# Patient Record
Sex: Male | Born: 1960 | Race: Black or African American | Hispanic: No | Marital: Married | Smoking: Current every day smoker
Health system: Southern US, Community
[De-identification: ages and names within clinical notes are randomized; demographics above are authoritative.]

## PROBLEM LIST (undated history)

## (undated) DIAGNOSIS — I7300 Raynaud's syndrome without gangrene: Secondary | ICD-10-CM

## (undated) DIAGNOSIS — I509 Heart failure, unspecified: Secondary | ICD-10-CM

## (undated) DIAGNOSIS — I2510 Atherosclerotic heart disease of native coronary artery without angina pectoris: Secondary | ICD-10-CM

## (undated) DIAGNOSIS — I219 Acute myocardial infarction, unspecified: Secondary | ICD-10-CM

## (undated) DIAGNOSIS — I10 Essential (primary) hypertension: Secondary | ICD-10-CM

## (undated) DIAGNOSIS — I313 Pericardial effusion (noninflammatory): Secondary | ICD-10-CM

## (undated) DIAGNOSIS — R569 Unspecified convulsions: Secondary | ICD-10-CM

## (undated) DIAGNOSIS — L80 Vitiligo: Secondary | ICD-10-CM

## (undated) DIAGNOSIS — E039 Hypothyroidism, unspecified: Secondary | ICD-10-CM

## (undated) DIAGNOSIS — I319 Disease of pericardium, unspecified: Secondary | ICD-10-CM

## (undated) DIAGNOSIS — F419 Anxiety disorder, unspecified: Secondary | ICD-10-CM

## (undated) DIAGNOSIS — F17200 Nicotine dependence, unspecified, uncomplicated: Secondary | ICD-10-CM

## (undated) DIAGNOSIS — N184 Chronic kidney disease, stage 4 (severe): Secondary | ICD-10-CM

## (undated) DIAGNOSIS — E785 Hyperlipidemia, unspecified: Secondary | ICD-10-CM

## (undated) DIAGNOSIS — I5030 Unspecified diastolic (congestive) heart failure: Secondary | ICD-10-CM

## (undated) DIAGNOSIS — M349 Systemic sclerosis, unspecified: Secondary | ICD-10-CM

## (undated) HISTORY — DX: Unspecified diastolic (congestive) heart failure: I50.30

## (undated) HISTORY — DX: Raynaud's syndrome without gangrene: I73.00

## (undated) HISTORY — PX: COLON SURGERY: SHX602

## (undated) HISTORY — DX: Chronic kidney disease, stage 4 (severe): N18.4

## (undated) HISTORY — DX: Hypothyroidism, unspecified: E03.9

## (undated) HISTORY — DX: Pericardial effusion (noninflammatory): I31.3

## (undated) HISTORY — DX: Hyperlipidemia, unspecified: E78.5

## (undated) HISTORY — DX: Atherosclerotic heart disease of native coronary artery without angina pectoris: I25.10

## (undated) HISTORY — DX: Vitiligo: L80

## (undated) HISTORY — DX: Systemic sclerosis, unspecified: M34.9

## (undated) HISTORY — DX: Anxiety disorder, unspecified: F41.9

## (undated) HISTORY — DX: Disease of pericardium, unspecified: I31.9

## (undated) HISTORY — DX: Acute myocardial infarction, unspecified: I21.9

## (undated) HISTORY — DX: Essential (primary) hypertension: I10

## (undated) SURGERY — Surgical Case
Anesthesia: *Unknown

---
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
## 1997-11-05 ENCOUNTER — Emergency Department (HOSPITAL_COMMUNITY): Admission: EM | Admit: 1997-11-05 | Discharge: 1997-11-05 | Payer: Self-pay | Admitting: Emergency Medicine

## 1997-11-06 ENCOUNTER — Emergency Department (HOSPITAL_COMMUNITY): Admission: EM | Admit: 1997-11-06 | Discharge: 1997-11-06 | Payer: Self-pay | Admitting: Emergency Medicine

## 1997-11-07 ENCOUNTER — Emergency Department (HOSPITAL_COMMUNITY): Admission: EM | Admit: 1997-11-07 | Discharge: 1997-11-07 | Payer: Self-pay | Admitting: Emergency Medicine

## 1998-09-07 ENCOUNTER — Emergency Department (HOSPITAL_COMMUNITY): Admission: EM | Admit: 1998-09-07 | Discharge: 1998-09-07 | Payer: Self-pay | Admitting: Emergency Medicine

## 2001-08-13 ENCOUNTER — Encounter: Payer: Self-pay | Admitting: Internal Medicine

## 2001-08-13 ENCOUNTER — Encounter: Admission: RE | Admit: 2001-08-13 | Discharge: 2001-08-13 | Payer: Self-pay | Admitting: Internal Medicine

## 2005-08-21 ENCOUNTER — Encounter (HOSPITAL_BASED_OUTPATIENT_CLINIC_OR_DEPARTMENT_OTHER): Admission: RE | Admit: 2005-08-21 | Discharge: 2005-11-19 | Payer: Self-pay | Admitting: Internal Medicine

## 2005-08-22 ENCOUNTER — Ambulatory Visit (HOSPITAL_COMMUNITY): Admission: RE | Admit: 2005-08-22 | Discharge: 2005-08-22 | Payer: Self-pay | Admitting: Internal Medicine

## 2005-08-22 IMAGING — CR DG HAND COMPLETE 3+V*L*
3 series · 3 of 3 positions shown · non-contrast
Comparison: none

CLINICAL DATA: Multiple open ulcers on both hands.  They began on the right hand three weeks ago.
 RIGHT HAND COMPLETE ? 3 VIEWS:

[x hand ap left *]
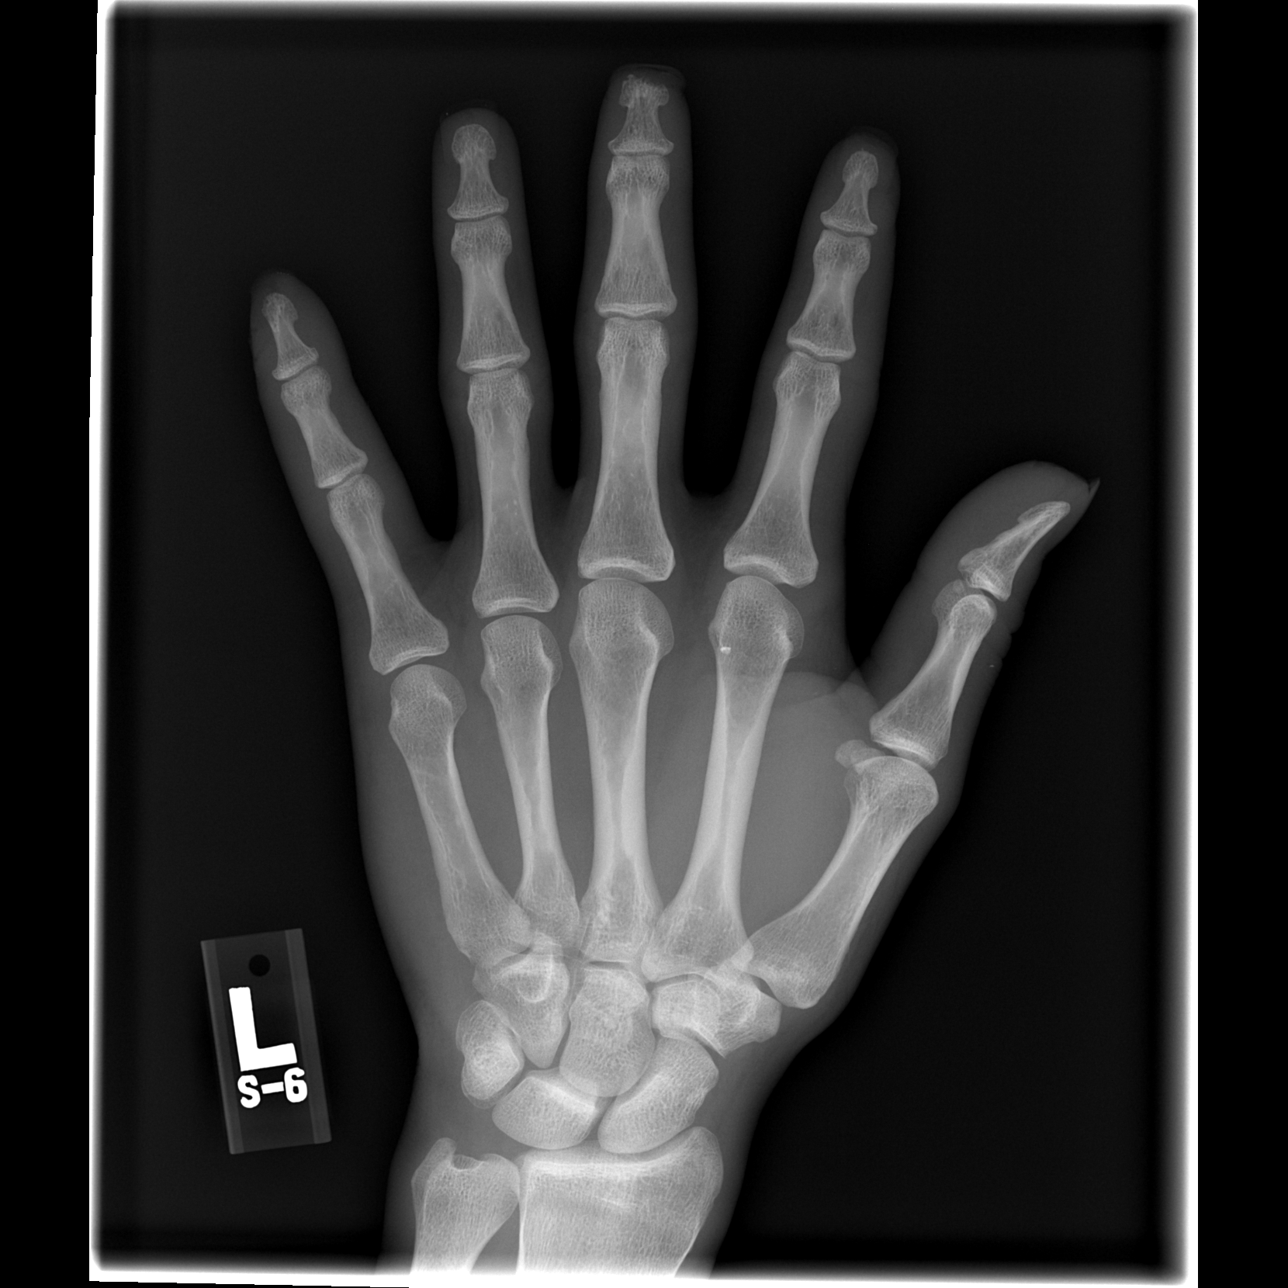

[x hand oblique left]
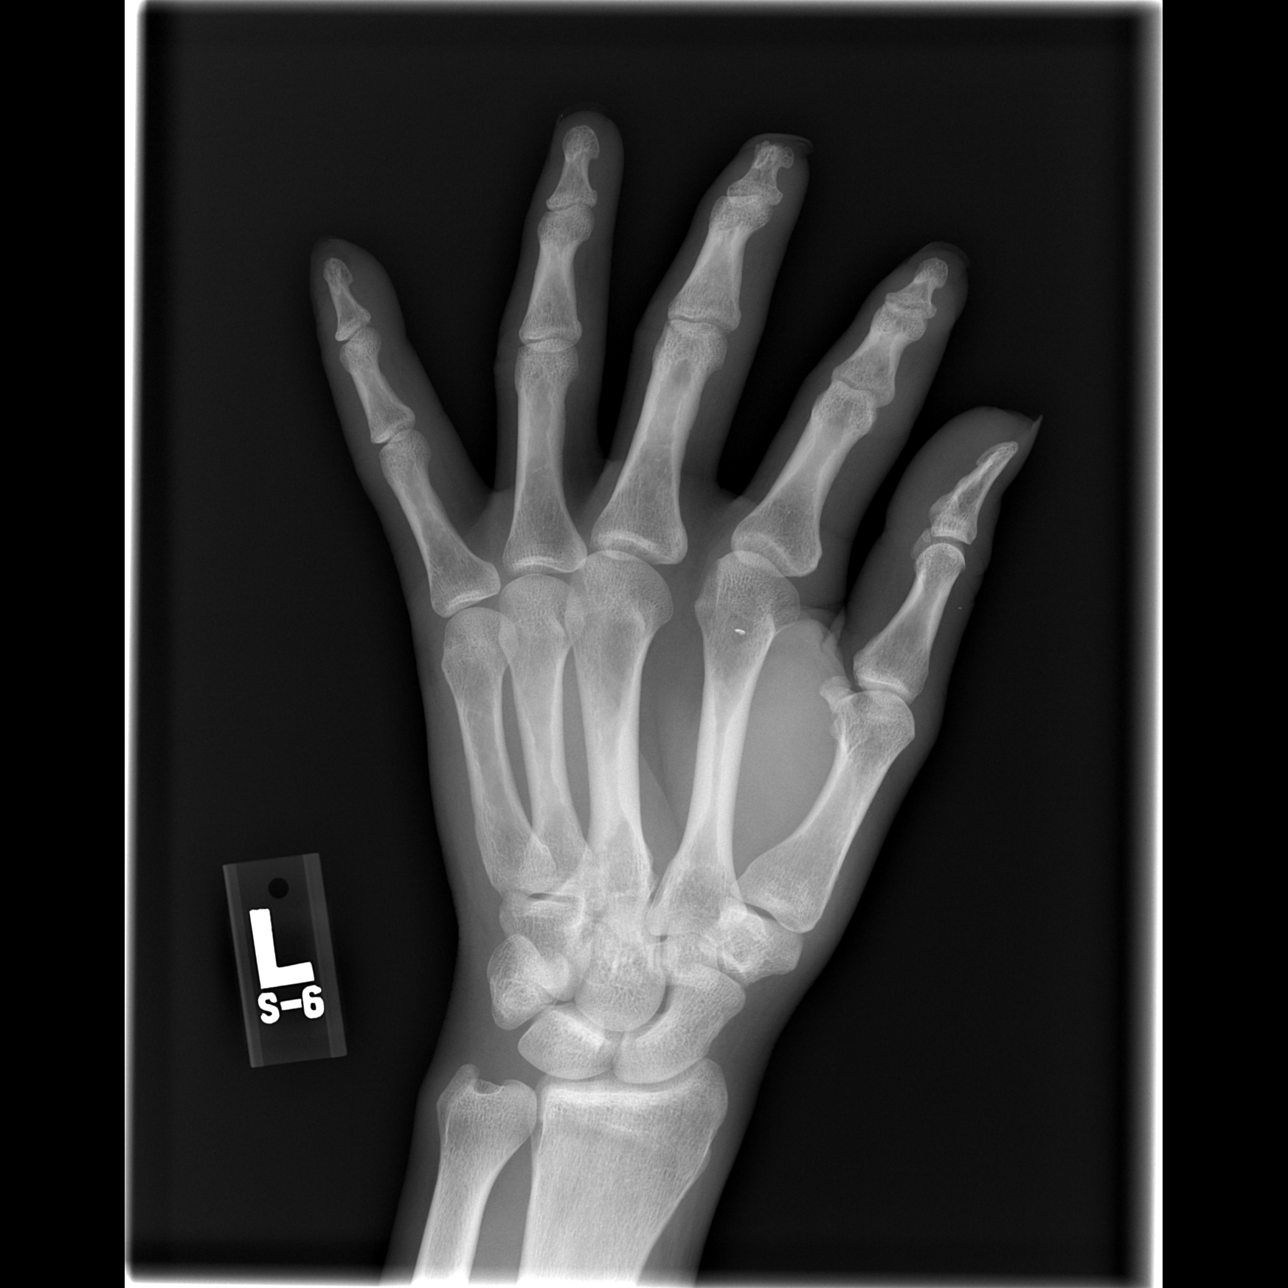

[x hand lat left]
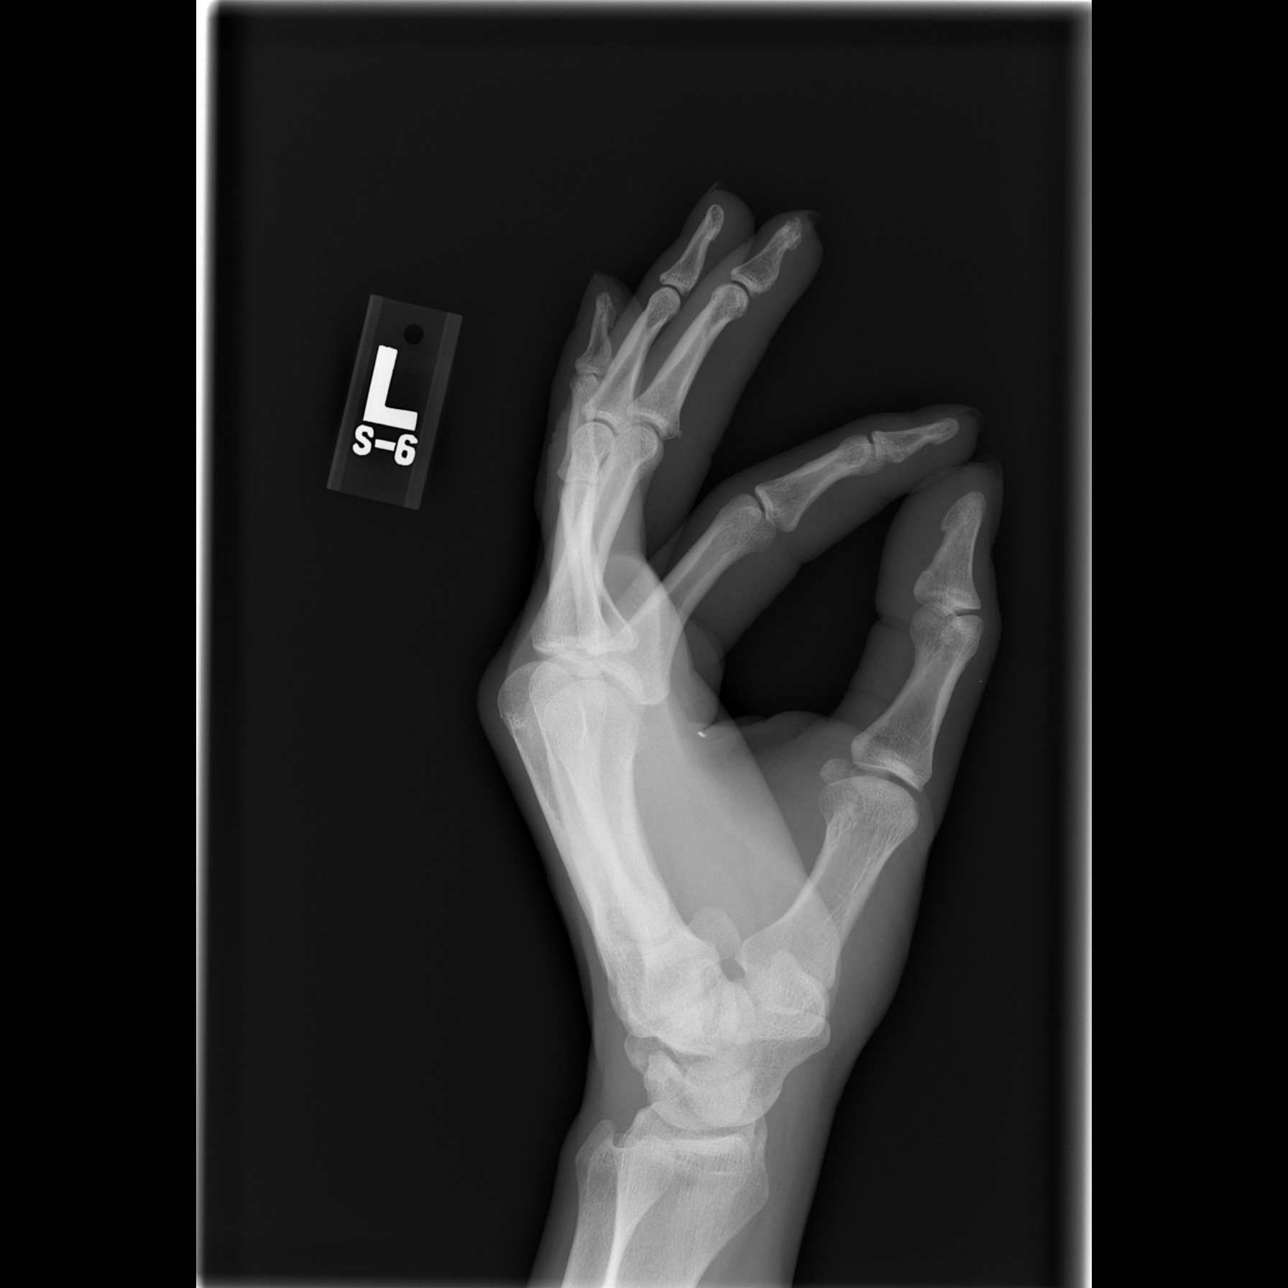

[3 of 3 positions shown; findings below may reference images not displayed]

FINDINGS: Three views of the right hand are made without previous films for comparison and show what appears to be an old partial amputation of the distal phalanx of the index finger which appears to be relatively well healed.  There is also what appears to be degenerative hypertrophic arthritic change associated with the distal interphalangeal joint of the ring finger and there is a small dorsal calcification over the area of the distal interphalangeal joint of the ring finger of questionable etiology.  The calcification measures approximately 1 x 1 x 3 mm.  
 No evidence of osteomyelitis is seen.  There is relatively little swelling associated with the soft tissues of the hand.  The wrist joints appear normal.
IMPRESSION: Degenerative arthritic change with questionable traumatic arthritis in the region of the distal interphalangeal joint of the right ring finger with a 1 x 1 x 3 mm dorsal calcification over that joint.
 Probable traumatic partial amputation of the distal phalanx of the right index finger.  No evidence of osteomyelitis or metallic foreign body is seen.
 LEFT HAND COMPLETE ? 3 VIEWS:
FINDINGS: PA, oblique, and lateral views of the left hand show what appears to be a 1 x 1 x 2 mm metallic foreign body along the palmar surface of the hand near the area of the head and neck of the second metacarpal.  There is also a less than 1 mm in diameter metallic foreign body in the soft tissues dorsal to the proximal phalanx of the thumb.  I see no evidence of osteomyelitis or fracture.  The joint spaces are well maintained.  There appears to be a deformity of the tip of the distal phalanx of the left middle finger suggesting old trauma in that area.  Soft tissues are not significantly swollen.
IMPRESSION: No evidence of osteomyelitis or acute fracture of the left hand.
 There are metallic foreign bodies as noted above.  There also appears to be an old traumatic deformity of the distal tip of the left middle finger.

## 2005-08-22 IMAGING — CR DG HAND COMPLETE 3+V*R*
3 series · 3 of 3 positions shown · non-contrast
Comparison: none

CLINICAL DATA: Multiple open ulcers on both hands.  They began on the right hand three weeks ago.
 RIGHT HAND COMPLETE ? 3 VIEWS:

[x hand ap right]
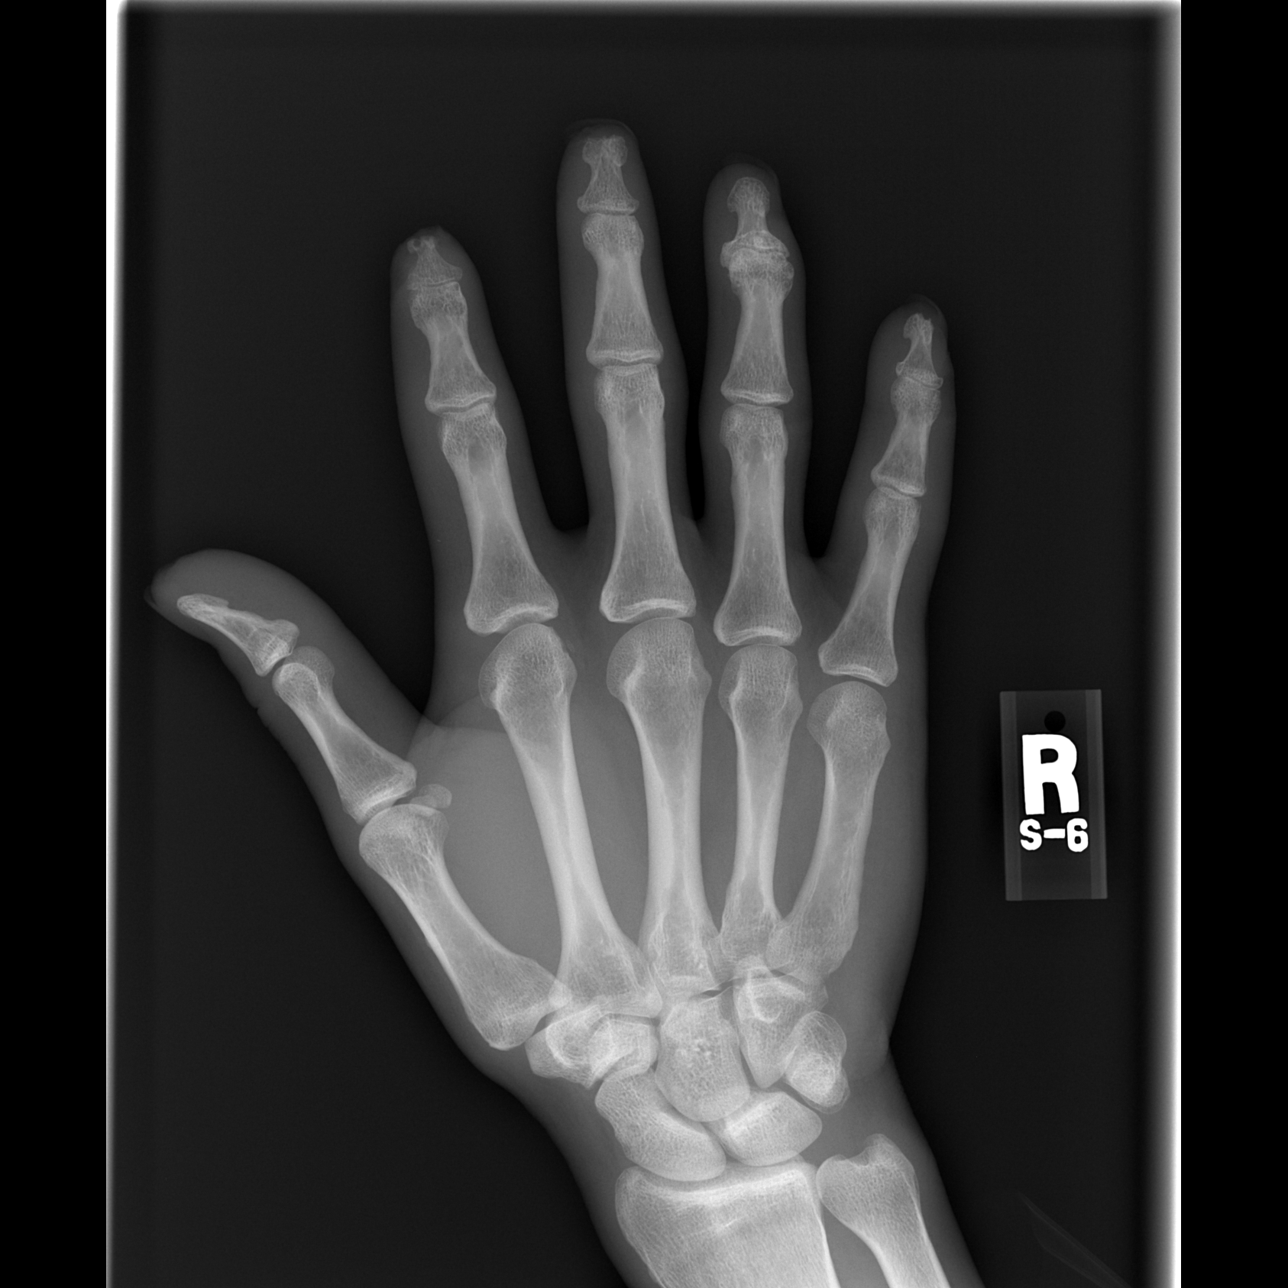

[x hand oblique right]
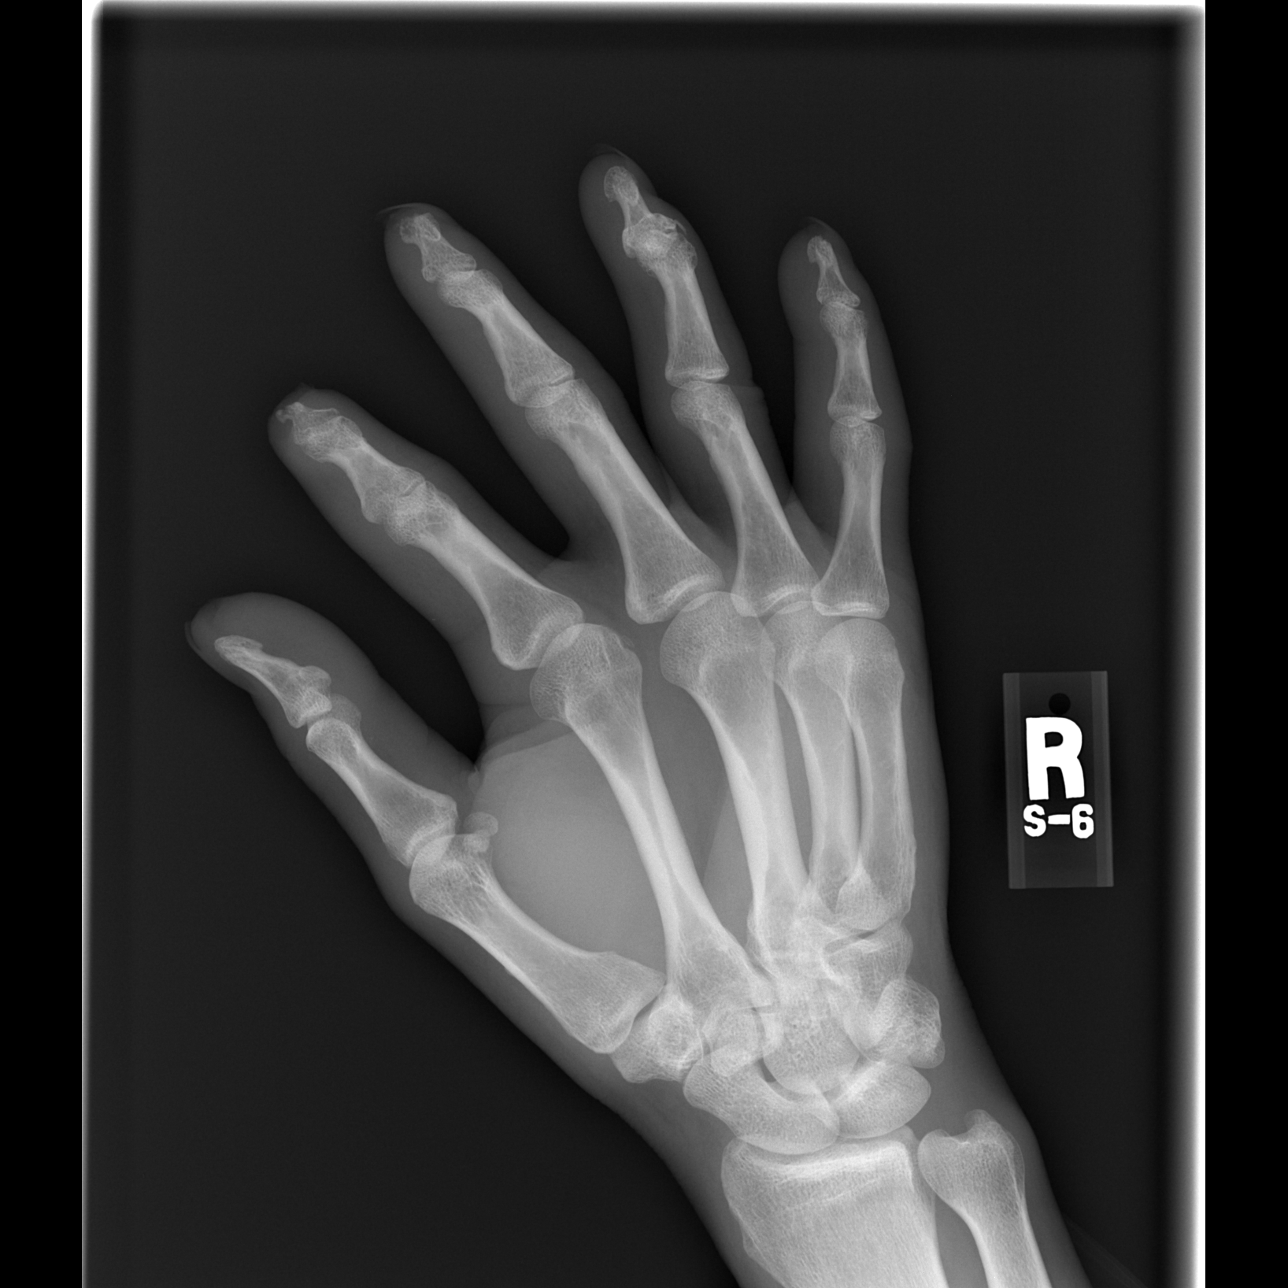

[x hand lat right]
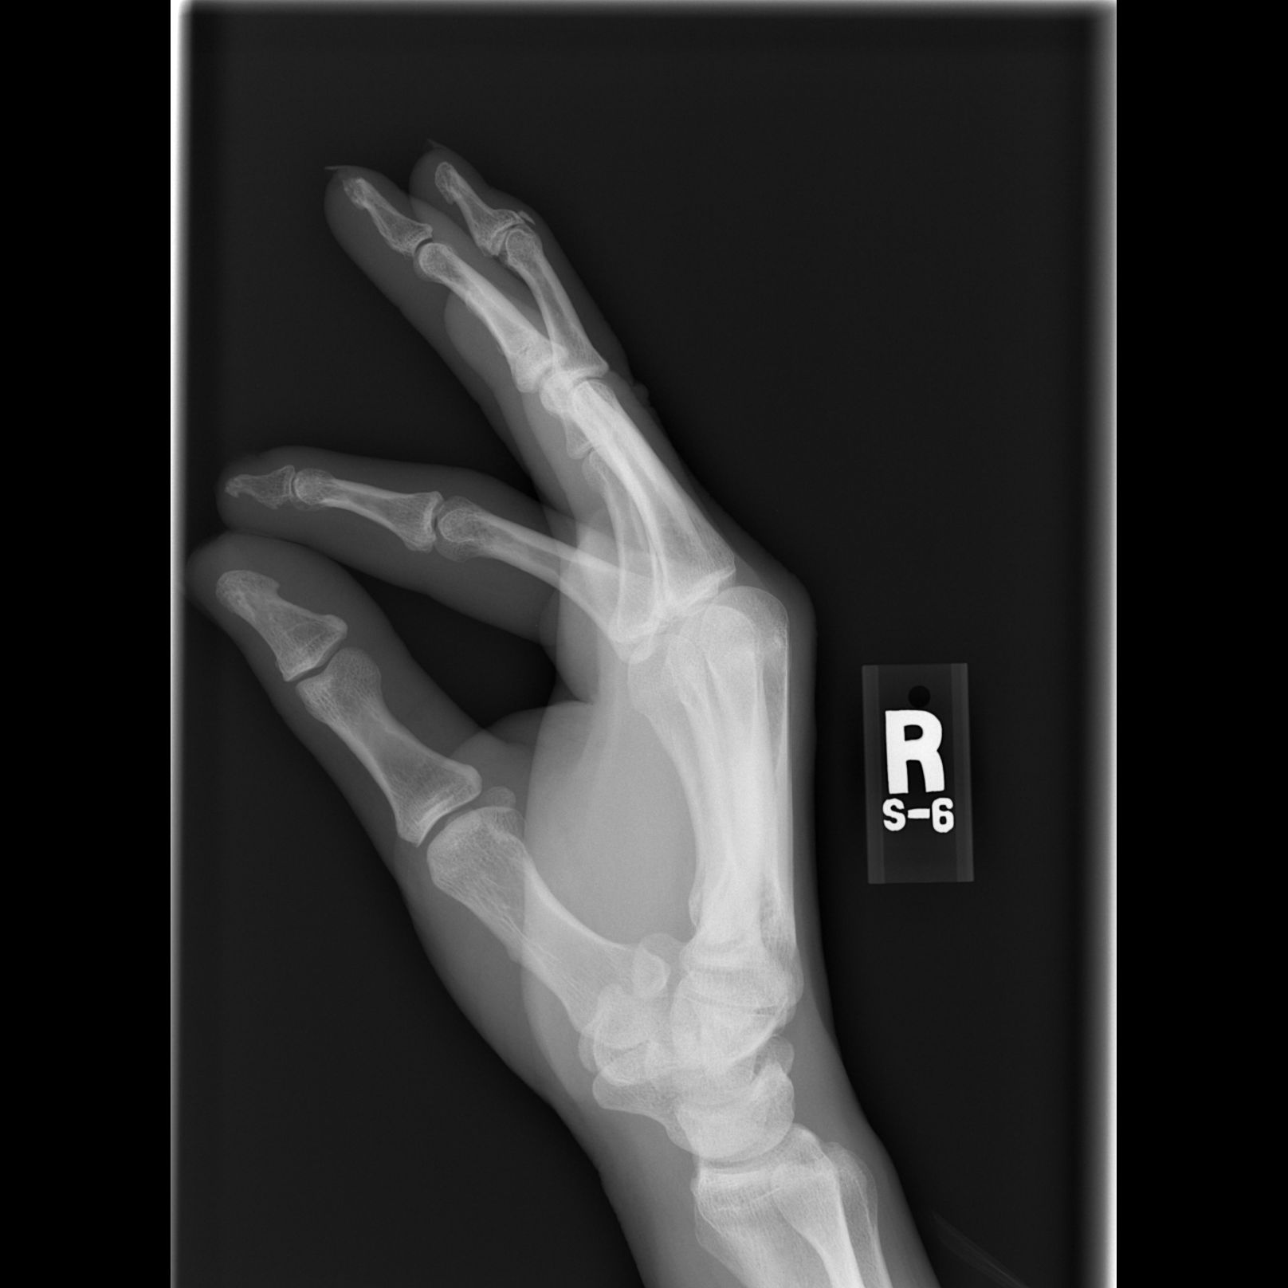

[3 of 3 positions shown; findings below may reference images not displayed]

FINDINGS: Three views of the right hand are made without previous films for comparison and show what appears to be an old partial amputation of the distal phalanx of the index finger which appears to be relatively well healed.  There is also what appears to be degenerative hypertrophic arthritic change associated with the distal interphalangeal joint of the ring finger and there is a small dorsal calcification over the area of the distal interphalangeal joint of the ring finger of questionable etiology.  The calcification measures approximately 1 x 1 x 3 mm.  
 No evidence of osteomyelitis is seen.  There is relatively little swelling associated with the soft tissues of the hand.  The wrist joints appear normal.
IMPRESSION: Degenerative arthritic change with questionable traumatic arthritis in the region of the distal interphalangeal joint of the right ring finger with a 1 x 1 x 3 mm dorsal calcification over that joint.
 Probable traumatic partial amputation of the distal phalanx of the right index finger.  No evidence of osteomyelitis or metallic foreign body is seen.
 LEFT HAND COMPLETE ? 3 VIEWS:
FINDINGS: PA, oblique, and lateral views of the left hand show what appears to be a 1 x 1 x 2 mm metallic foreign body along the palmar surface of the hand near the area of the head and neck of the second metacarpal.  There is also a less than 1 mm in diameter metallic foreign body in the soft tissues dorsal to the proximal phalanx of the thumb.  I see no evidence of osteomyelitis or fracture.  The joint spaces are well maintained.  There appears to be a deformity of the tip of the distal phalanx of the left middle finger suggesting old trauma in that area.  Soft tissues are not significantly swollen.
IMPRESSION: No evidence of osteomyelitis or acute fracture of the left hand.
 There are metallic foreign bodies as noted above.  There also appears to be an old traumatic deformity of the distal tip of the left middle finger.

## 2007-08-01 ENCOUNTER — Encounter (HOSPITAL_BASED_OUTPATIENT_CLINIC_OR_DEPARTMENT_OTHER): Admission: RE | Admit: 2007-08-01 | Discharge: 2007-10-04 | Payer: Self-pay | Admitting: Surgery

## 2007-11-08 ENCOUNTER — Encounter: Payer: Self-pay | Admitting: Internal Medicine

## 2007-11-08 ENCOUNTER — Ambulatory Visit: Payer: Self-pay | Admitting: Internal Medicine

## 2007-11-08 LAB — PULMONARY FUNCTION TEST

## 2008-04-24 DIAGNOSIS — I219 Acute myocardial infarction, unspecified: Secondary | ICD-10-CM

## 2008-04-24 HISTORY — DX: Acute myocardial infarction, unspecified: I21.9

## 2008-12-23 DIAGNOSIS — I3139 Other pericardial effusion (noninflammatory): Secondary | ICD-10-CM

## 2008-12-23 DIAGNOSIS — I313 Pericardial effusion (noninflammatory): Secondary | ICD-10-CM

## 2008-12-23 HISTORY — DX: Other pericardial effusion (noninflammatory): I31.39

## 2008-12-23 HISTORY — DX: Pericardial effusion (noninflammatory): I31.3

## 2008-12-24 ENCOUNTER — Inpatient Hospital Stay (HOSPITAL_COMMUNITY): Admission: AD | Admit: 2008-12-24 | Discharge: 2008-12-28 | Payer: Self-pay | Admitting: Cardiovascular Disease

## 2008-12-24 ENCOUNTER — Ambulatory Visit: Payer: Self-pay | Admitting: Thoracic Surgery (Cardiothoracic Vascular Surgery)

## 2008-12-24 HISTORY — PX: CARDIAC CATHETERIZATION: SHX172

## 2008-12-25 ENCOUNTER — Encounter (INDEPENDENT_AMBULATORY_CARE_PROVIDER_SITE_OTHER): Payer: Self-pay | Admitting: Cardiovascular Disease

## 2008-12-25 ENCOUNTER — Encounter: Payer: Self-pay | Admitting: Thoracic Surgery (Cardiothoracic Vascular Surgery)

## 2008-12-25 HISTORY — PX: PERICARDIAL WINDOW: SHX2213

## 2008-12-25 IMAGING — CR DG CHEST 1V PORT
1 series · 1 of 1 positions shown · non-contrast
Comparison: Portable exam [EW] hours compared to [DATE] at [EW]
hours.

CLINICAL DATA: Fever

PORTABLE CHEST - 1 VIEW

[AP]
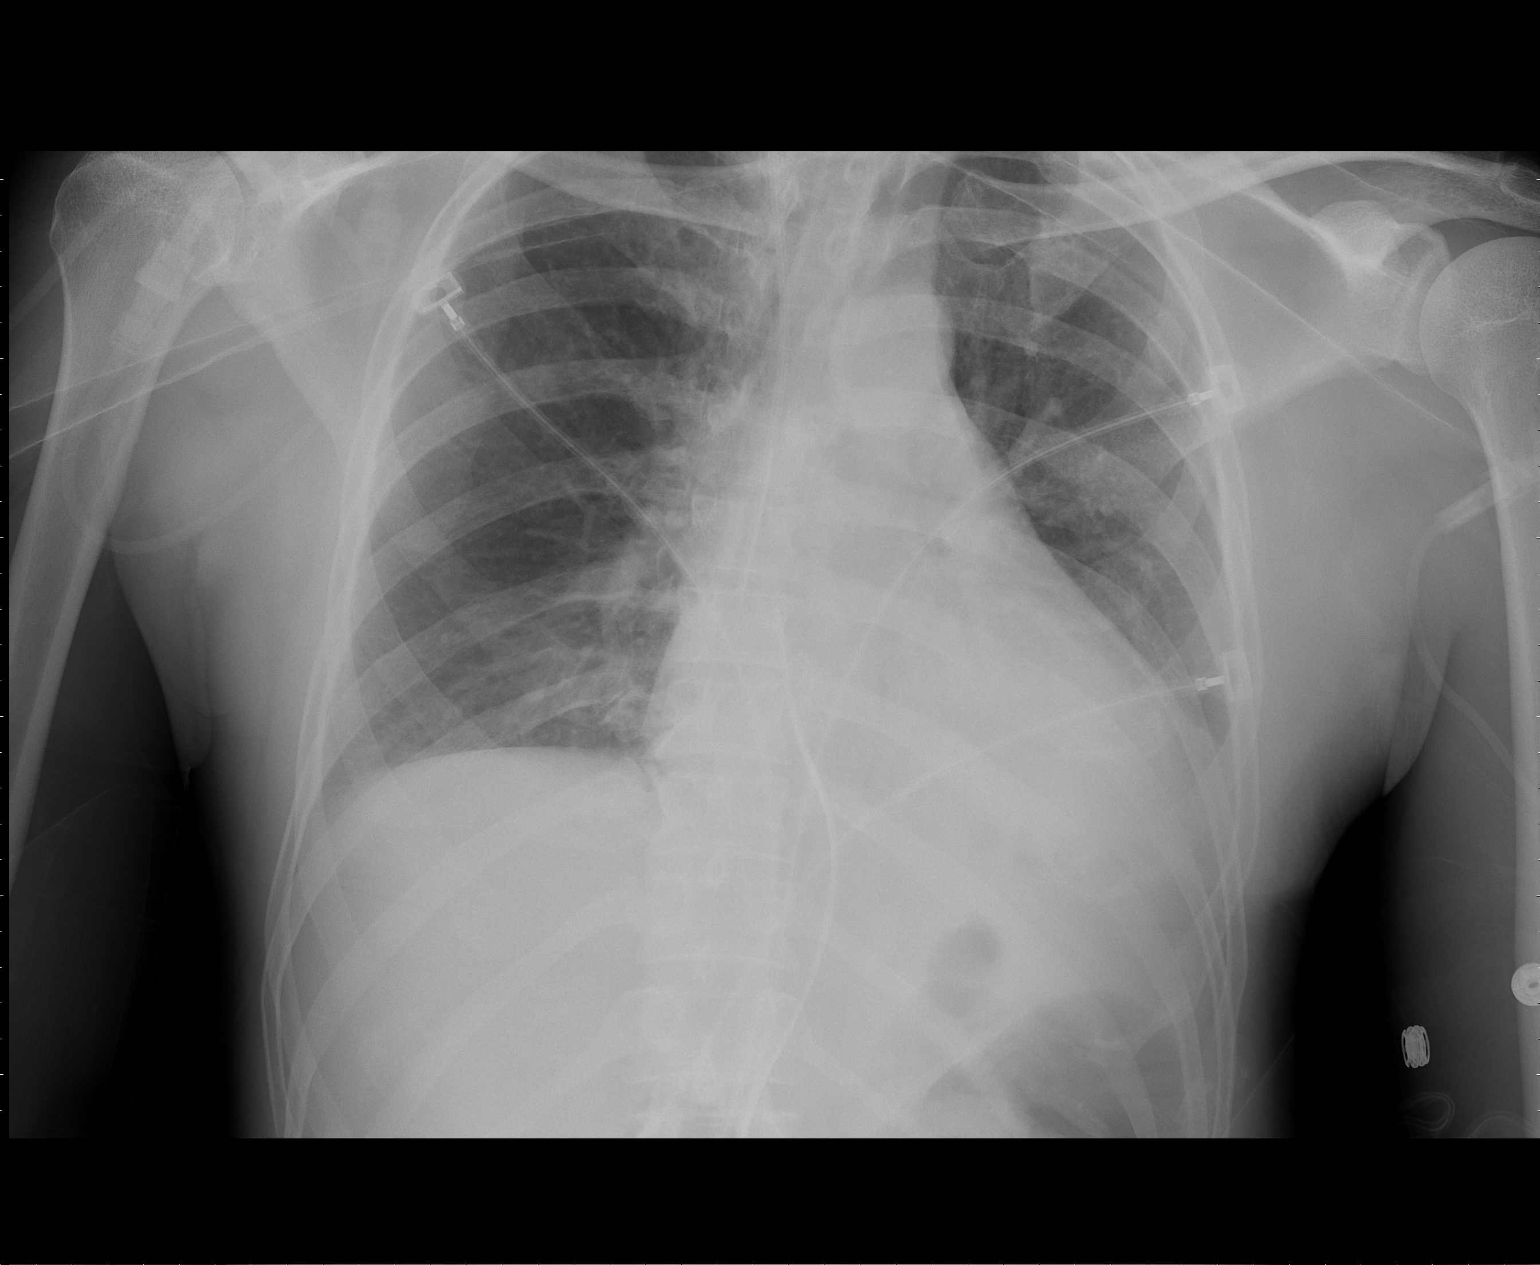

[1 of 1 positions shown; findings below may reference images not displayed]

FINDINGS: Right jugular line stable, tip at cavoatrial junction.
Cardiac enlargement.
Mediastinal contours and pulmonary vascularity normal.
Progressive atelectasis versus consolidation left lower lobe.
Minimal right basilar atelectasis.
Question small bibasilar pleural effusions.
No pneumothorax or focal bony abnormality.
IMPRESSION: Minimal right basilar atelectasis and suspect small pleural
effusions.
Progressive opacification of left lower lobe question atelectasis
versus consolidation.

## 2008-12-25 IMAGING — CR DG CHEST 1V PORT
1 series · 1 of 1 positions shown · non-contrast
Comparison: None.

CLINICAL DATA: Status post pericardial window.

PORTABLE CHEST - 1 VIEW

[view not recorded]
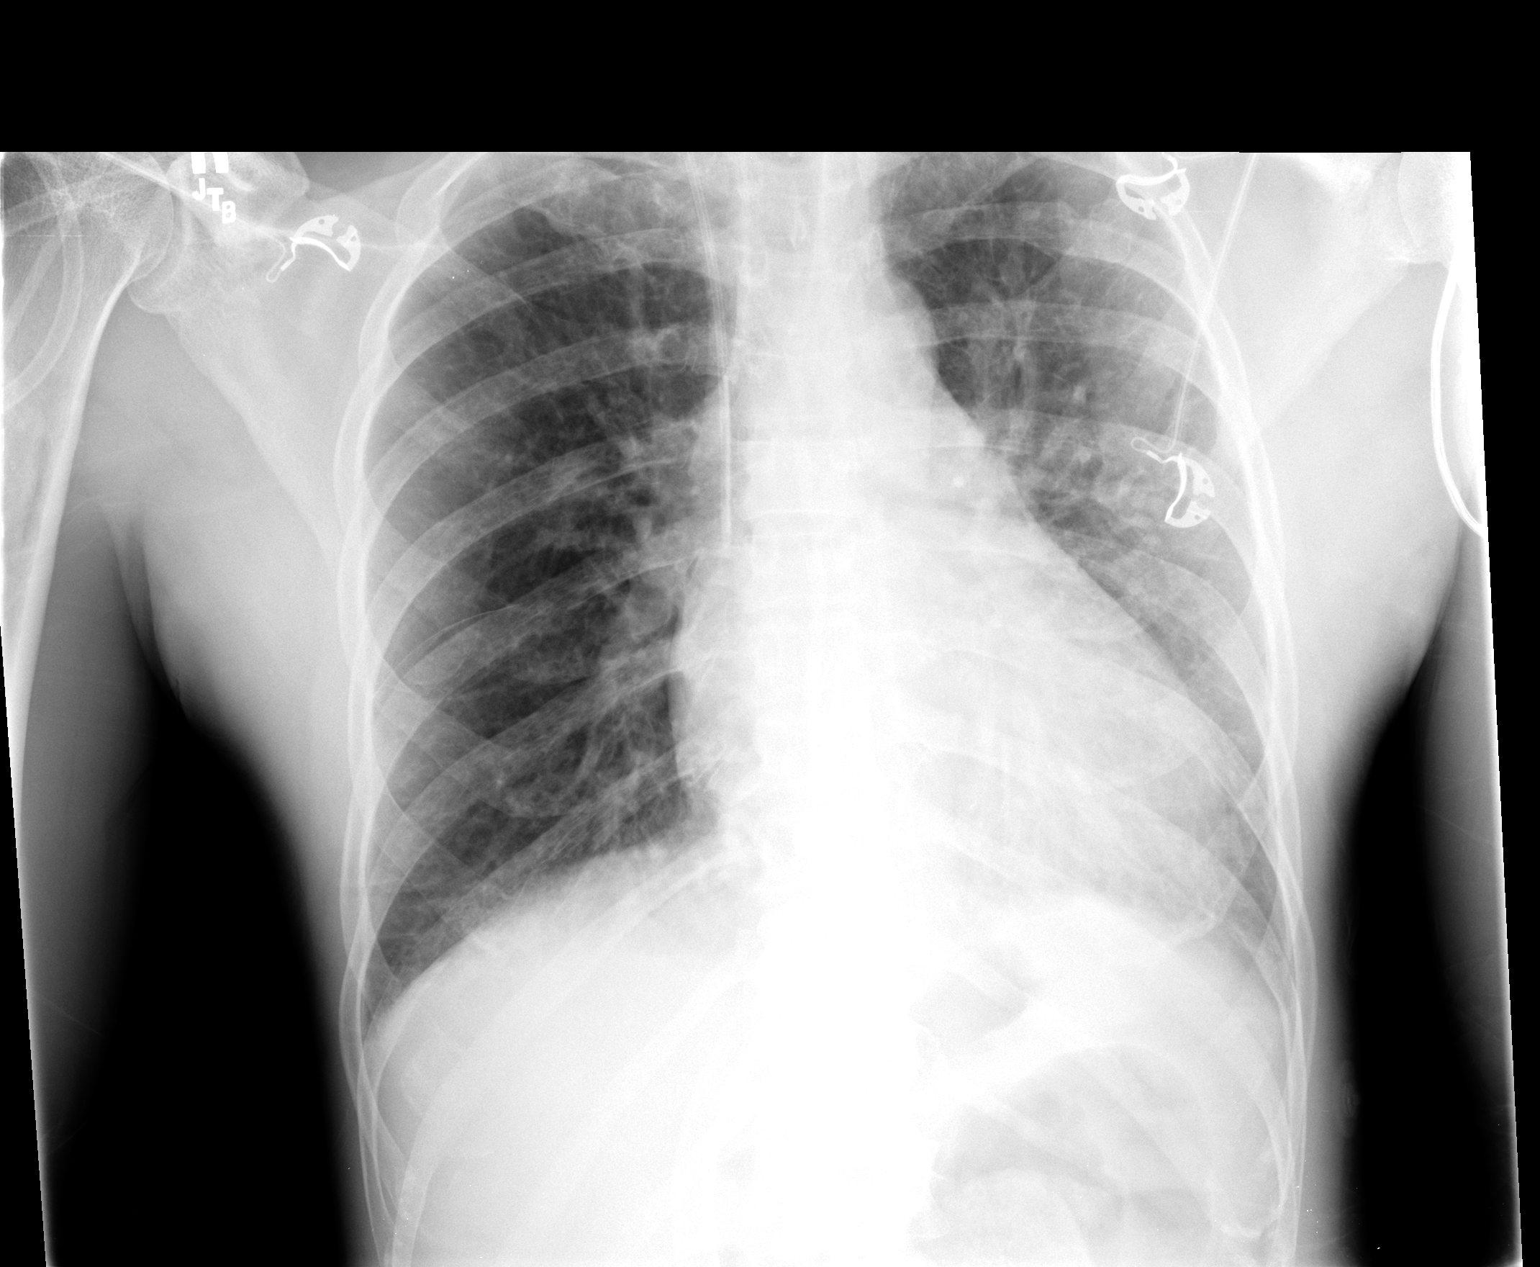

[1 of 1 positions shown; findings below may reference images not displayed]

FINDINGS: Enlarged cardiac silhouette.  Right jugular catheter tip
in the inferior aspect of the superior vena cava, near its junction
with the right atrium.  No pneumothorax.  Prominent pulmonary
vasculature and interstitial markings.  No airspace consolidation.
Possible tiny left pleural effusion.  Mediastinal tube kinked in
the lower chest.  Unremarkable bones.
IMPRESSION: 1.  Mediastinal tube kinked in the lower chest.  This could be
artifactual due to viewing a turn on end.  A lateral view would be
helpful for determining if the tube is actually kinked.
2.  Cardiomegaly, pulmonary vascular congestion and mild
interstitial pulmonary edema.
3.  Minimal left pleural fluid.

## 2008-12-26 IMAGING — CR DG CHEST 1V PORT
1 series · 1 of 1 positions shown · non-contrast
Comparison: [DATE]

CLINICAL DATA: Chest pain.  Chest tube placement.

PORTABLE CHEST - 1 VIEW

[AP]
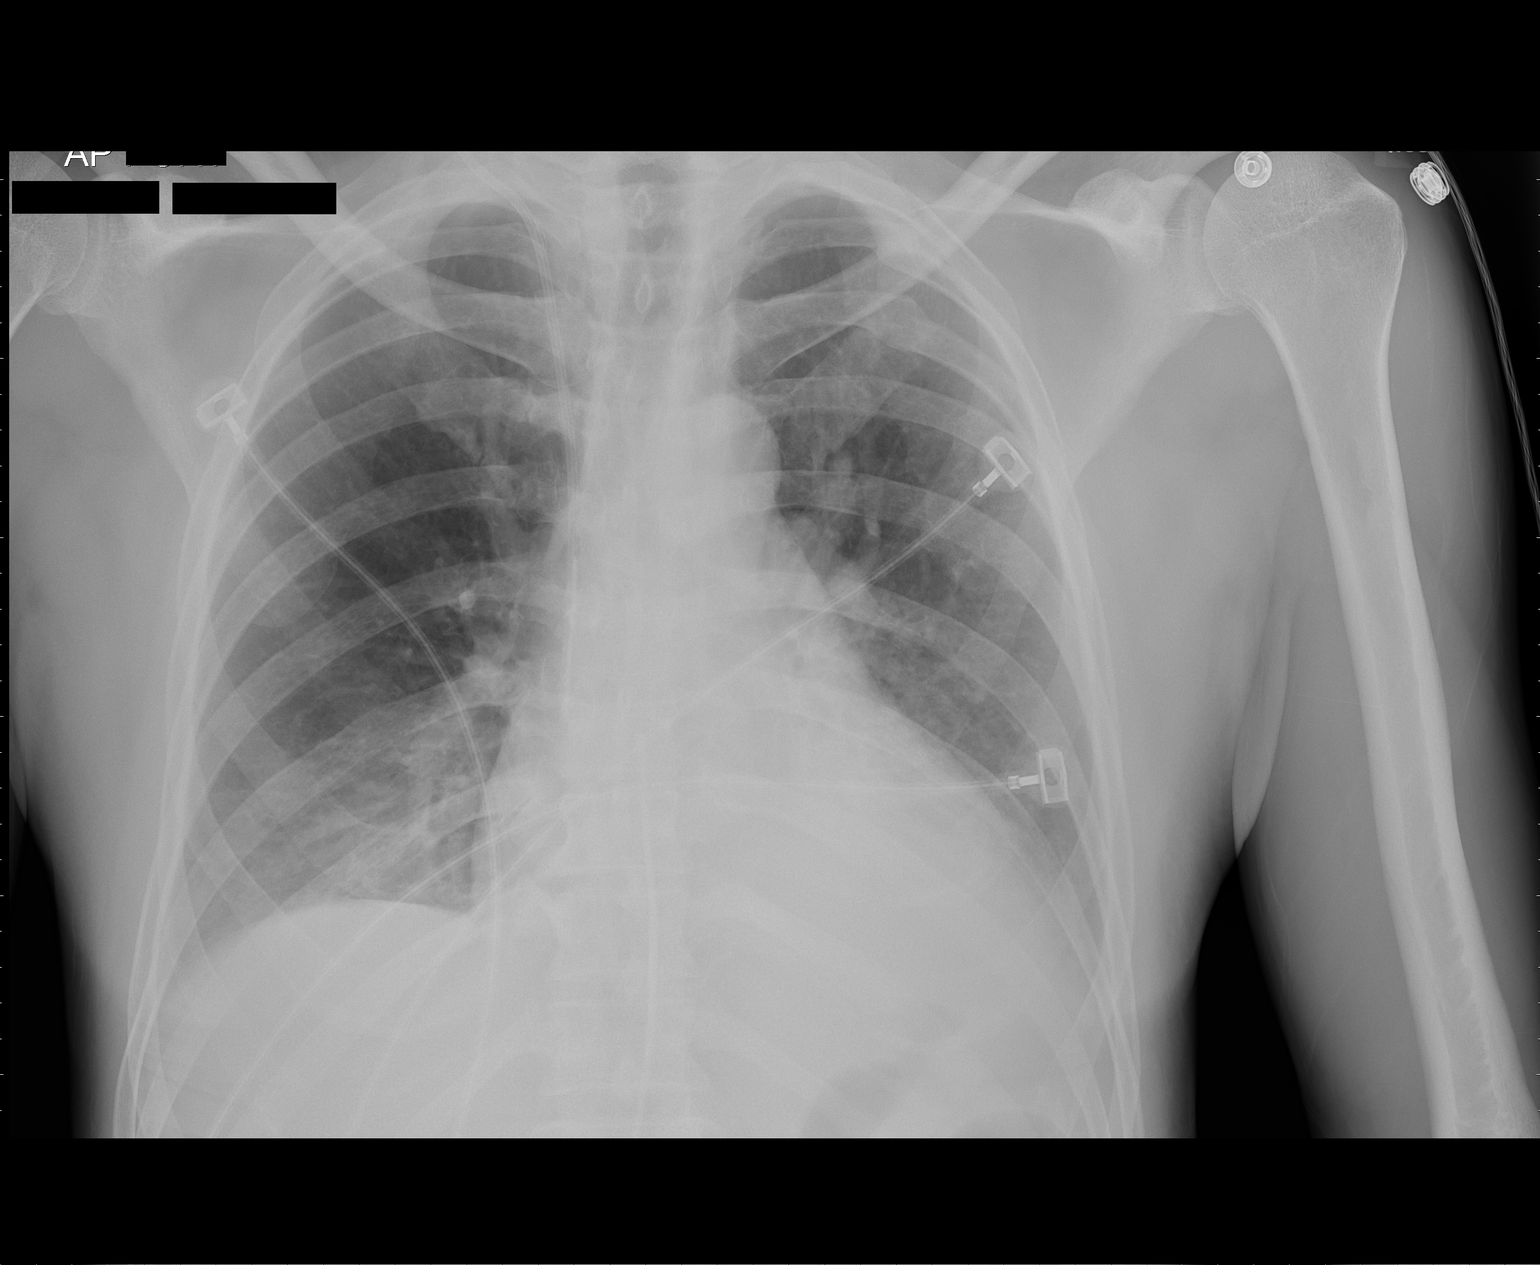

[1 of 1 positions shown; findings below may reference images not displayed]

FINDINGS: Portable exam is performed at [DATE] a.m.  Right internal
jugular central venous line tip overlies the level of the superior
vena cava.  A tube overlies the central mediastinum, tip overlying
T8.  History provided states "chest tube".  However, this could
represent a mediastinal drain.

Heart is enlarged.  There is dense opacification of the left lung
base, obscuring the left hemidiaphragm.  There is right basilar
atelectasis, increasing over prior studies.  There is pulmonary
vascular congestion without overt alveolar edema.  There is no
evidence for pneumothorax.  Old left rib fractures are identified.
IMPRESSION: 1.  Cardiomegaly and bibasilar opacities, left greater than right.
2.  Mediastinal drain or chest tube with tip overlying T8.  No
evidence for pneumothorax.

## 2008-12-27 IMAGING — CR DG CHEST 1V PORT
1 series · 1 of 1 positions shown · non-contrast
Comparison: Most recent film [DATE]

CLINICAL DATA: Chest pain.  Chest tube placement.

PORTABLE CHEST - 1 VIEW

[view not recorded]
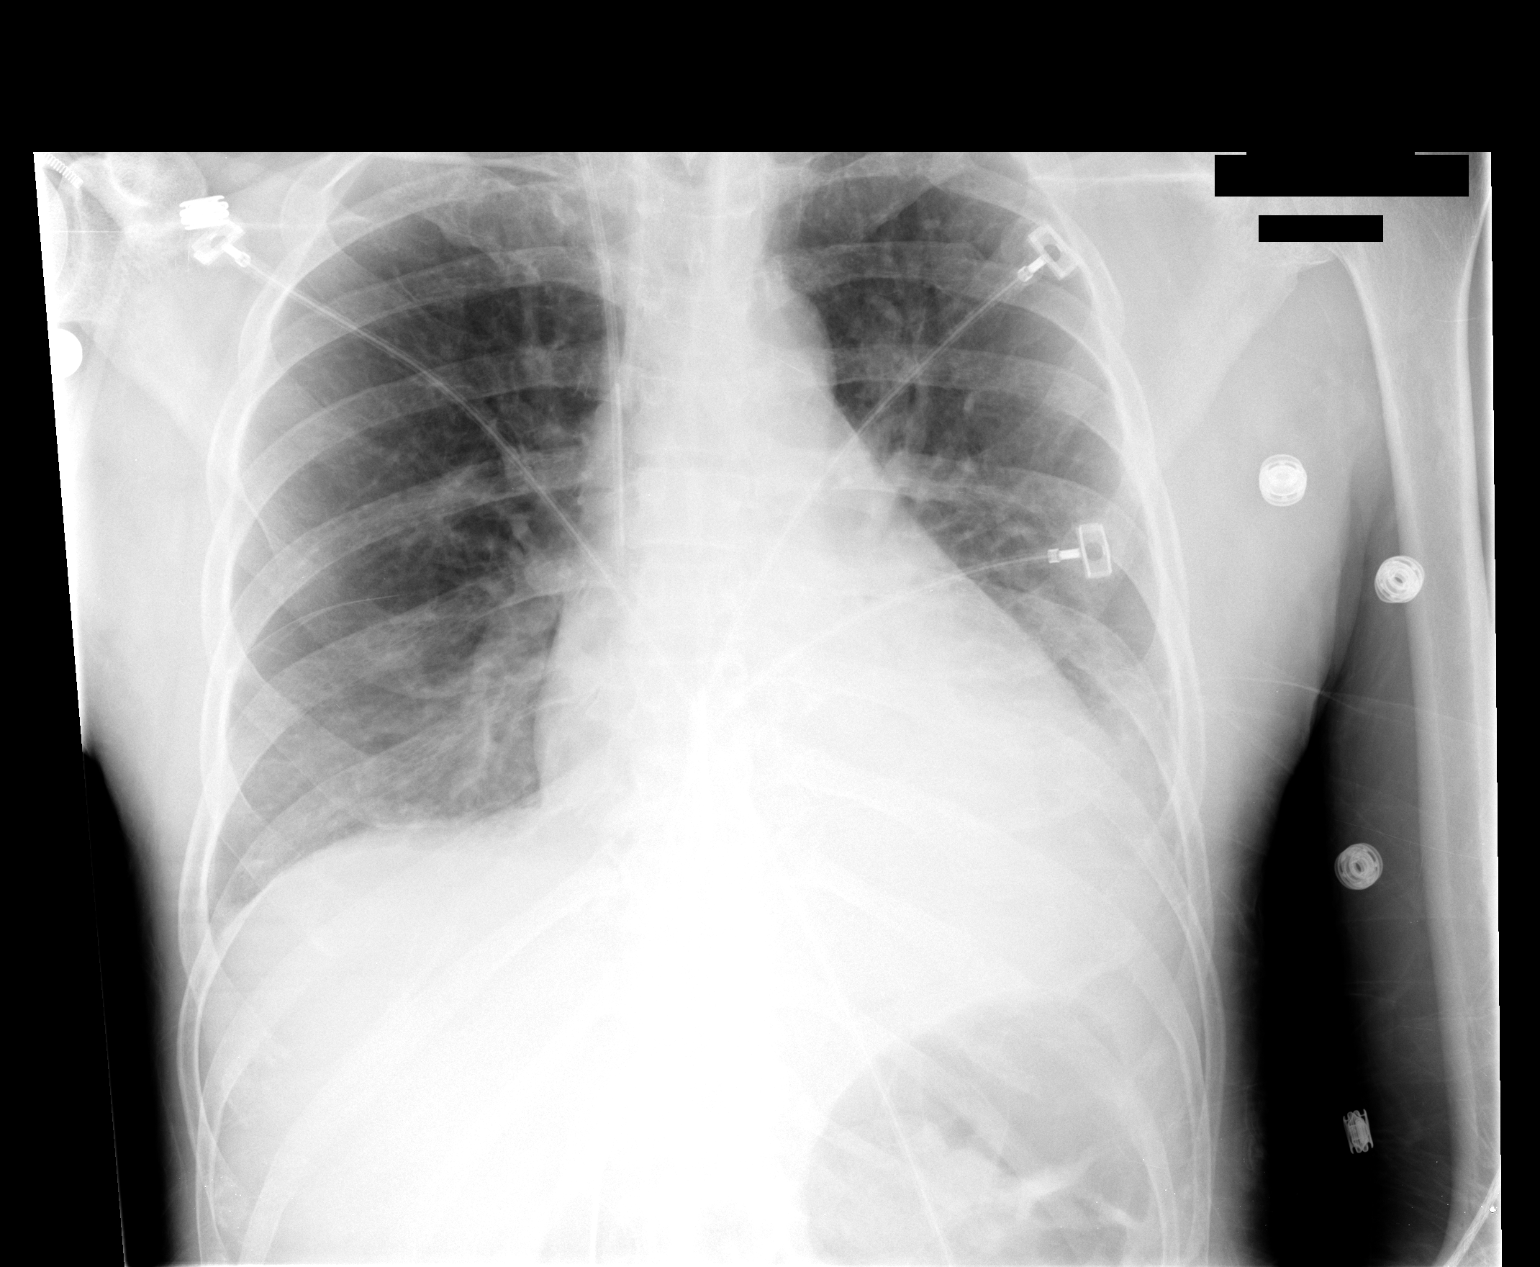

[1 of 1 positions shown; findings below may reference images not displayed]

FINDINGS: Midline chest tube/mediastinal tube remains unchanged in
position.  Dense retrocardiac opacity unchanged.  Right basilar
atelectasis stable to slightly improved.  Old left rib fractures
identified.  No pneumothorax.  Central venous line unchanged.
IMPRESSION: Stable chest.

## 2008-12-28 IMAGING — CR DG CHEST 2V
2 series · 2 of 2 positions shown · non-contrast
Comparison: [DATE]

CLINICAL DATA: Chest pain.

CHEST - 2 VIEW

[w chest pa]
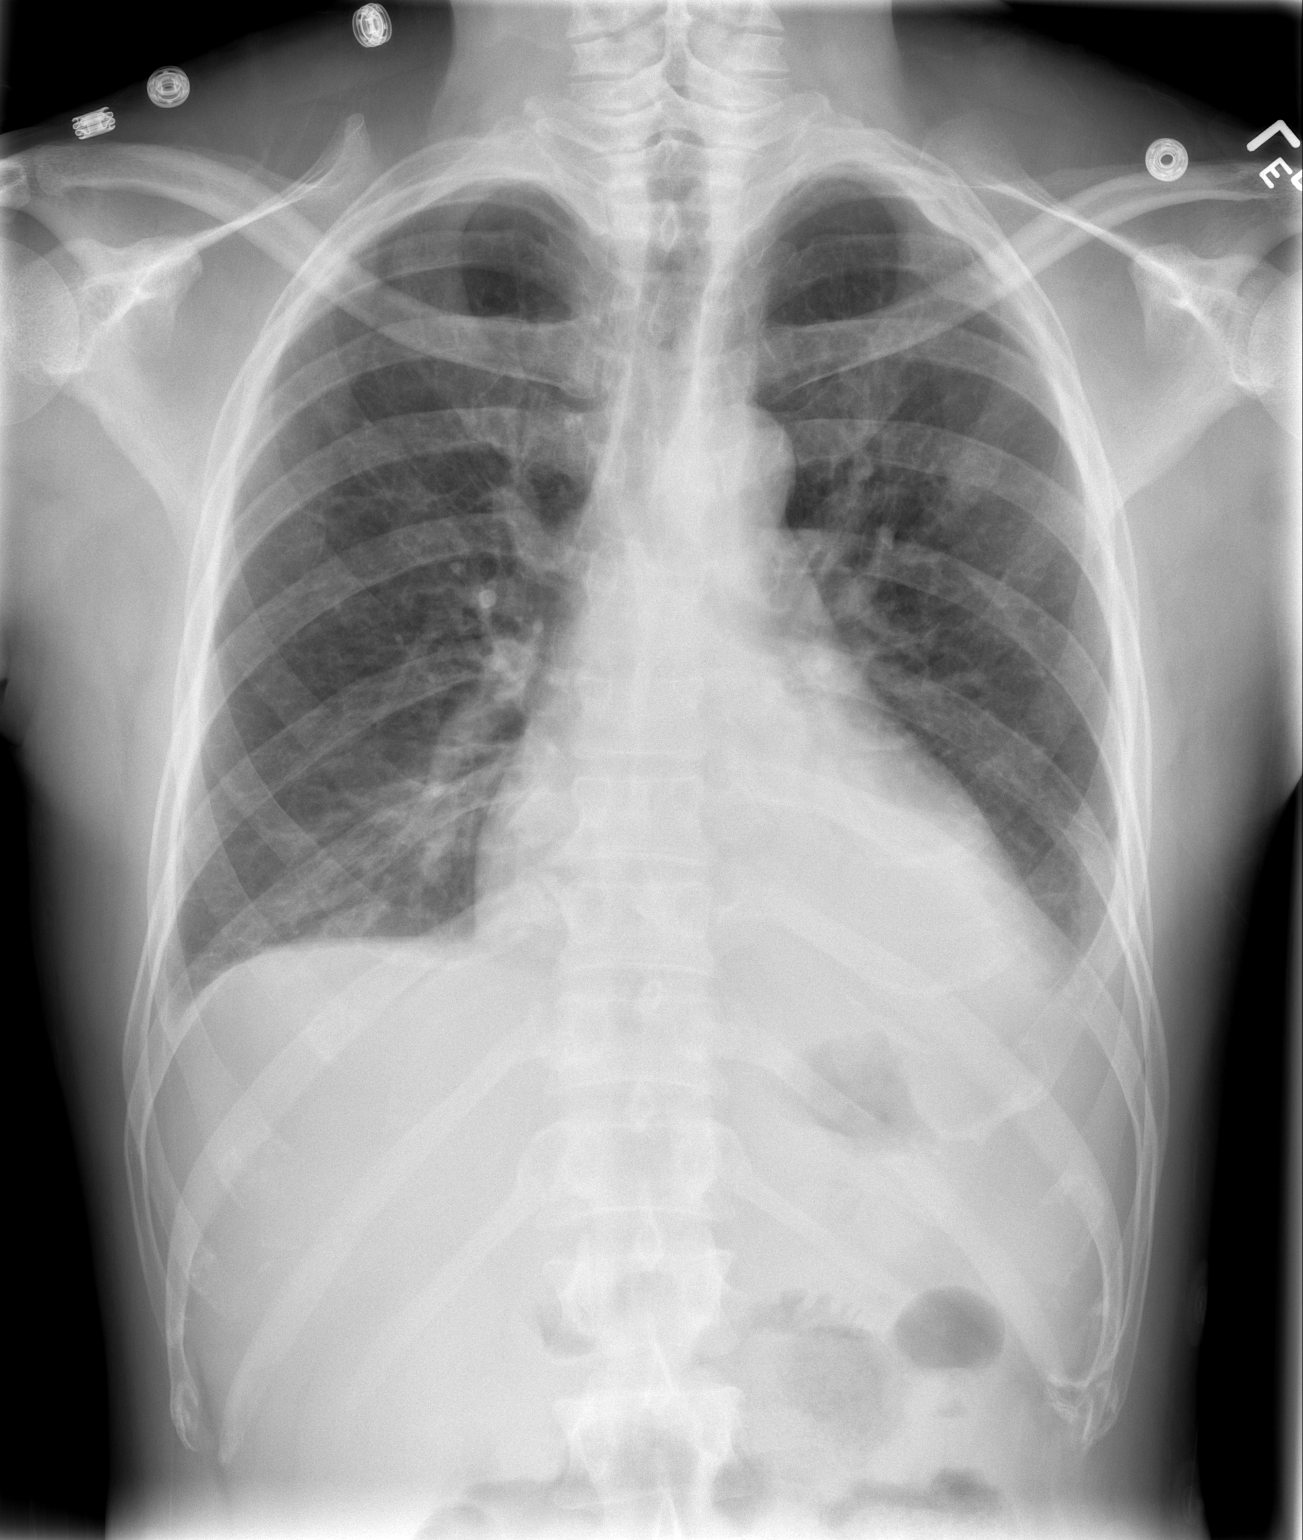

[w chest lat]
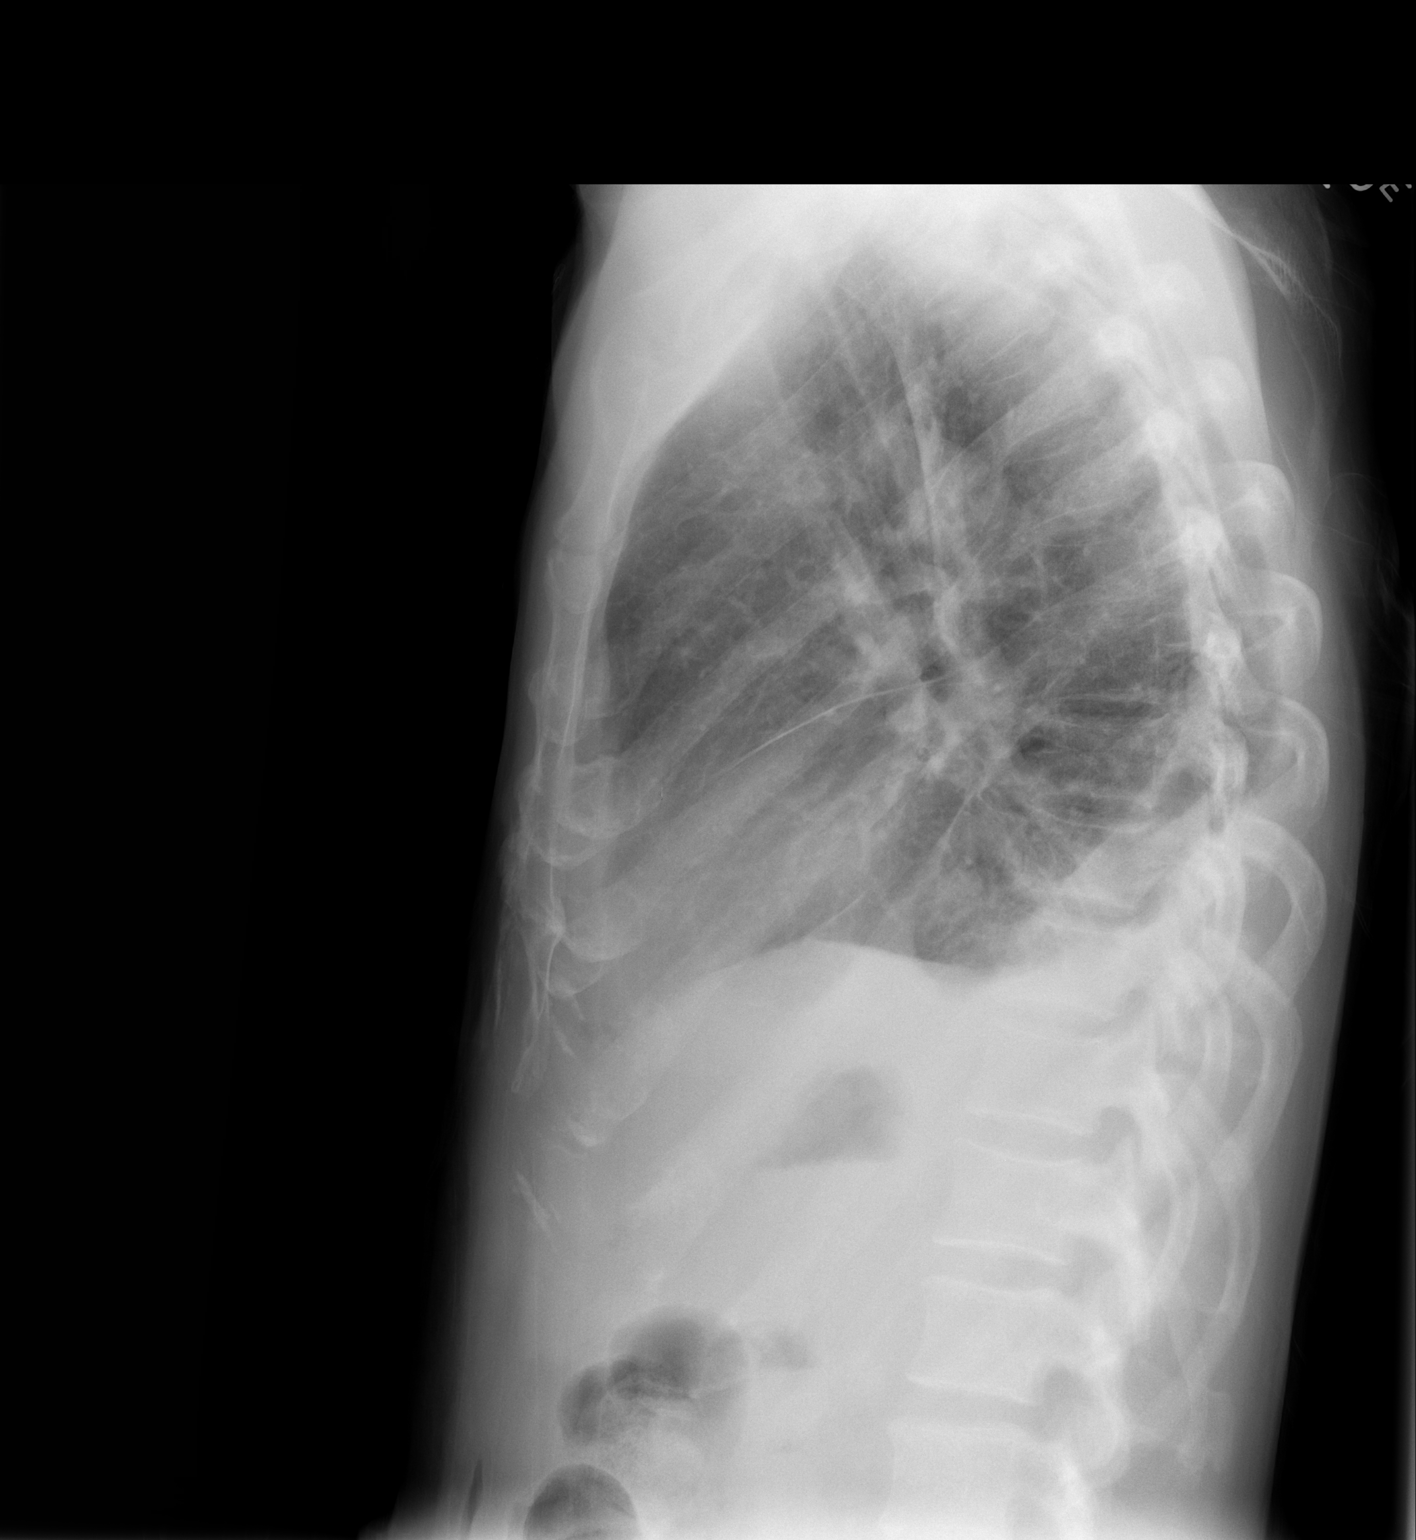

[2 of 2 positions shown; findings below may reference images not displayed]

FINDINGS: A small left pleural effusion and left retrocardiac
atelectasis or infiltrate remain stable.  Probable tiny right
pleural effusion also noted.  Heart size is within normal limits.
Right jugular center venous catheters been removed.  Old left rib
fracture deformities are again noted.
IMPRESSION: No significant change in small left pleural effusion and left
retrocardiac atelectasis versus infiltrate.

## 2008-12-31 ENCOUNTER — Ambulatory Visit: Payer: Self-pay | Admitting: Thoracic Surgery (Cardiothoracic Vascular Surgery)

## 2009-01-07 ENCOUNTER — Inpatient Hospital Stay (HOSPITAL_COMMUNITY): Admission: RE | Admit: 2009-01-07 | Discharge: 2009-01-08 | Payer: Self-pay | Admitting: Cardiovascular Disease

## 2009-01-07 HISTORY — PX: CORONARY ANGIOPLASTY WITH STENT PLACEMENT: SHX49

## 2009-01-11 ENCOUNTER — Ambulatory Visit: Payer: Self-pay | Admitting: Thoracic Surgery (Cardiothoracic Vascular Surgery)

## 2009-01-11 ENCOUNTER — Encounter
Admission: RE | Admit: 2009-01-11 | Discharge: 2009-01-11 | Payer: Self-pay | Admitting: Thoracic Surgery (Cardiothoracic Vascular Surgery)

## 2009-01-11 IMAGING — CR DG CHEST 2V
2 series · 2 of 2 positions shown · non-contrast
Comparison: [DATE]

CLINICAL DATA: Status post surgery for pericardial window.  No
acute complaints.

CHEST - 2 VIEW

[w chest pa]
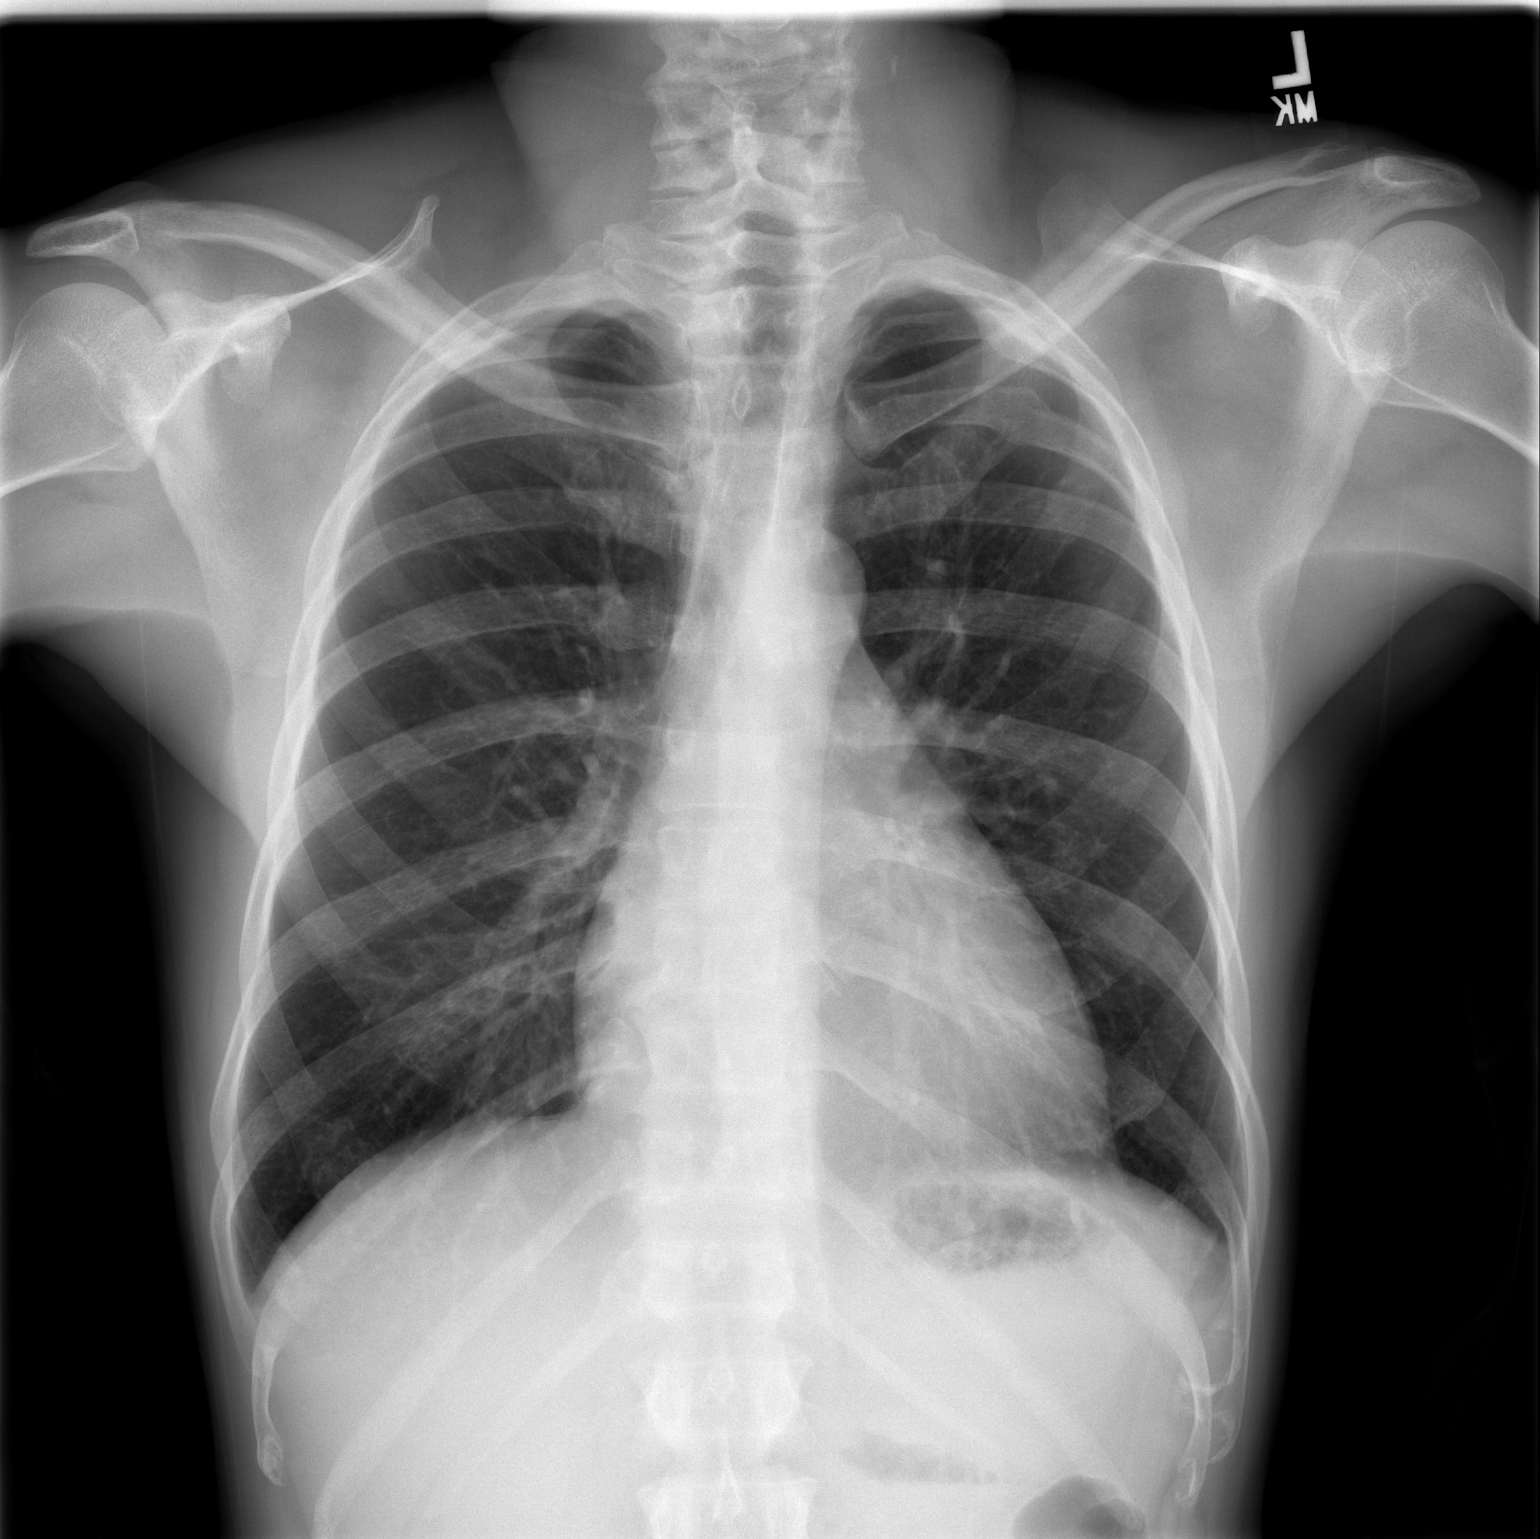

[w chest lat]
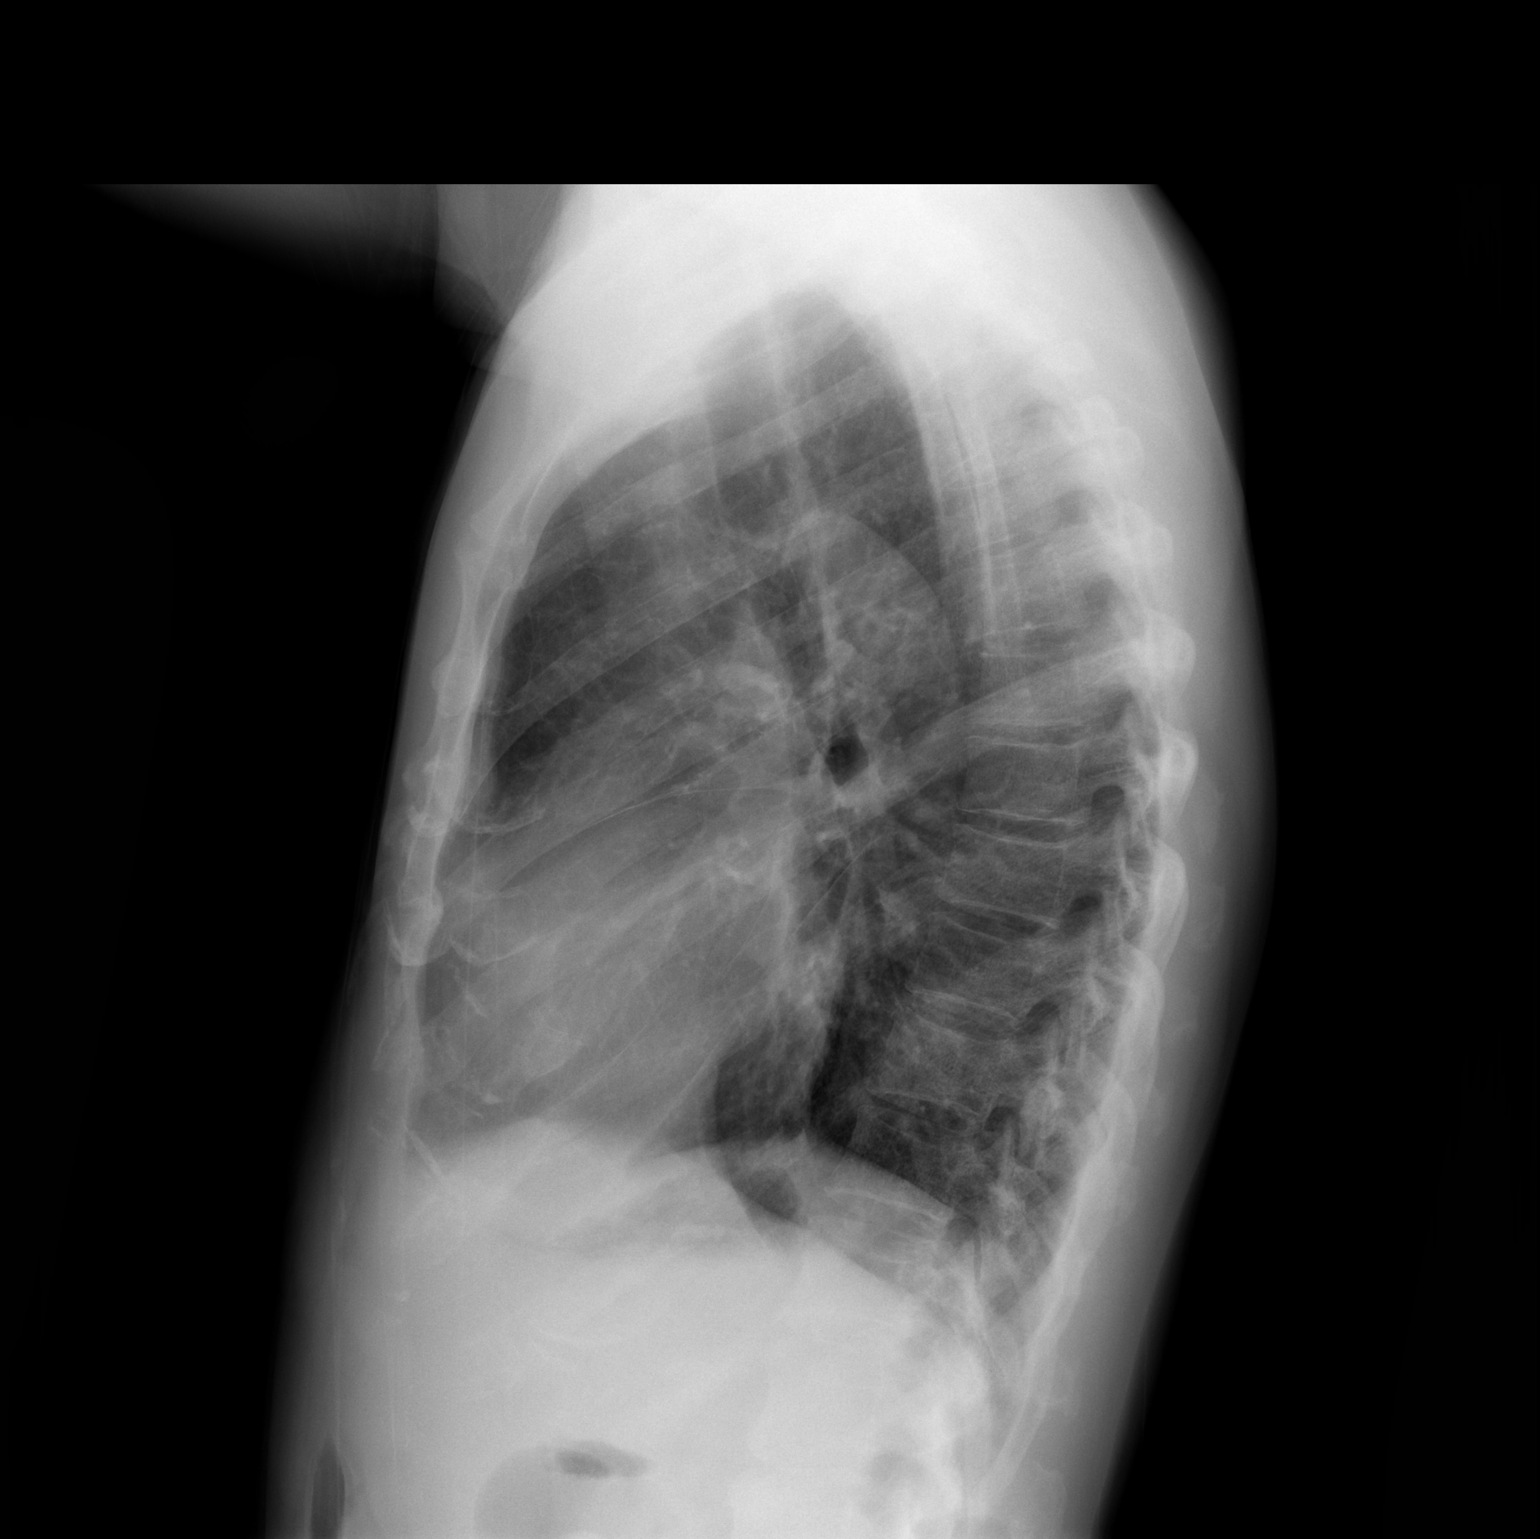

[2 of 2 positions shown; findings below may reference images not displayed]

FINDINGS: Remote left posterior superior rib trauma. Midline
trachea.  Heart size upper limits of normal. Mediastinal contours
otherwise within normal limits.  Minimal left apical pleural
thickening. No pleural effusion or pneumothorax.  Resolution of
left-sided pleural fluid.  Lungs clear. No congestive failure.
IMPRESSION: 1. No acute cardiopulmonary disease.
2.  Interval resolution of left-sided pleural fluid and lower lobe
airspace disease.

## 2009-04-02 ENCOUNTER — Encounter: Admission: RE | Admit: 2009-04-02 | Discharge: 2009-04-02 | Payer: Self-pay | Admitting: Cardiology

## 2009-04-02 IMAGING — CR DG CHEST 2V
2 series · 2 of 2 positions shown · non-contrast
Comparison: [DATE]

CLINICAL DATA: Smoker, cough, pericardial effusion.

CHEST - 2 VIEW

[w chest pa]
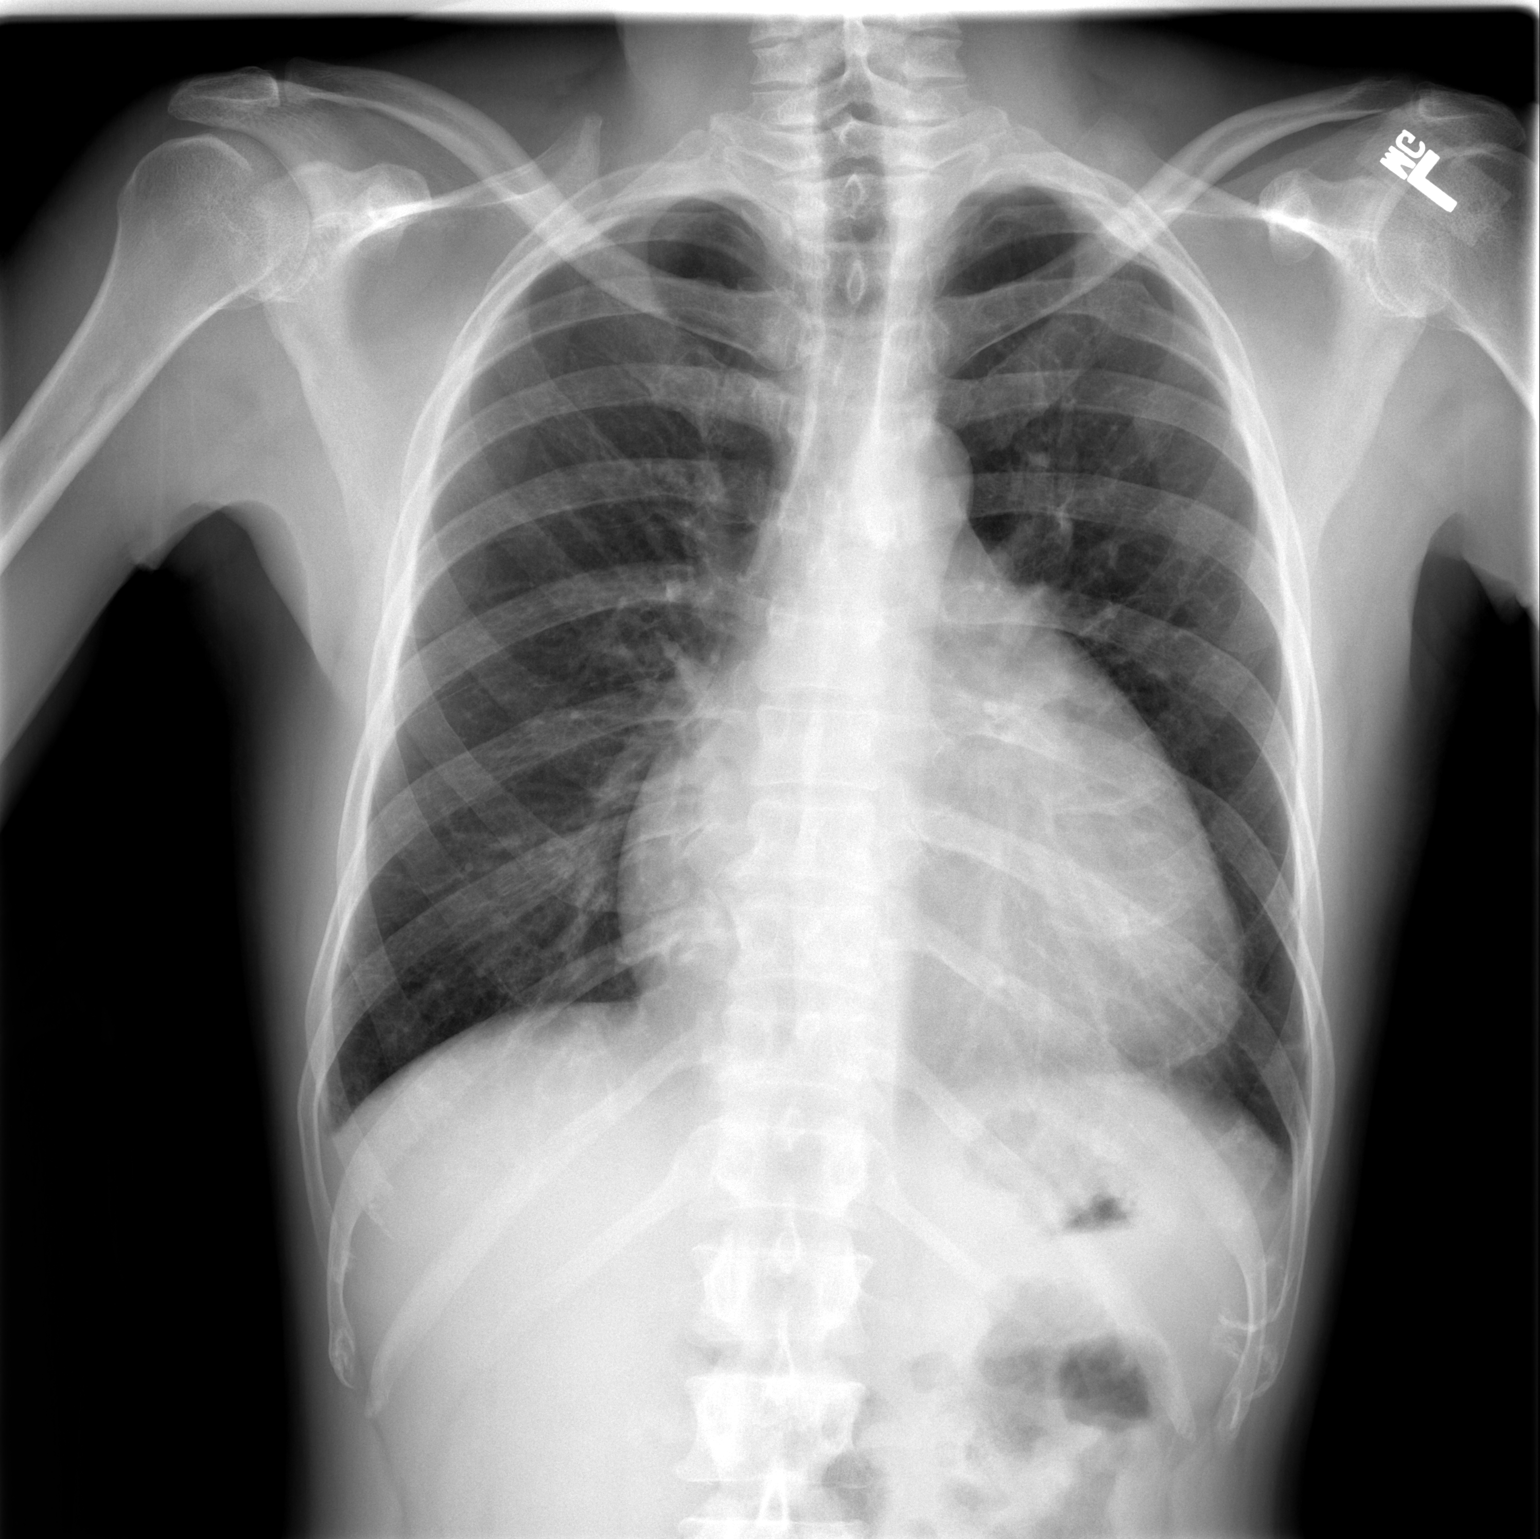

[w chest lat]
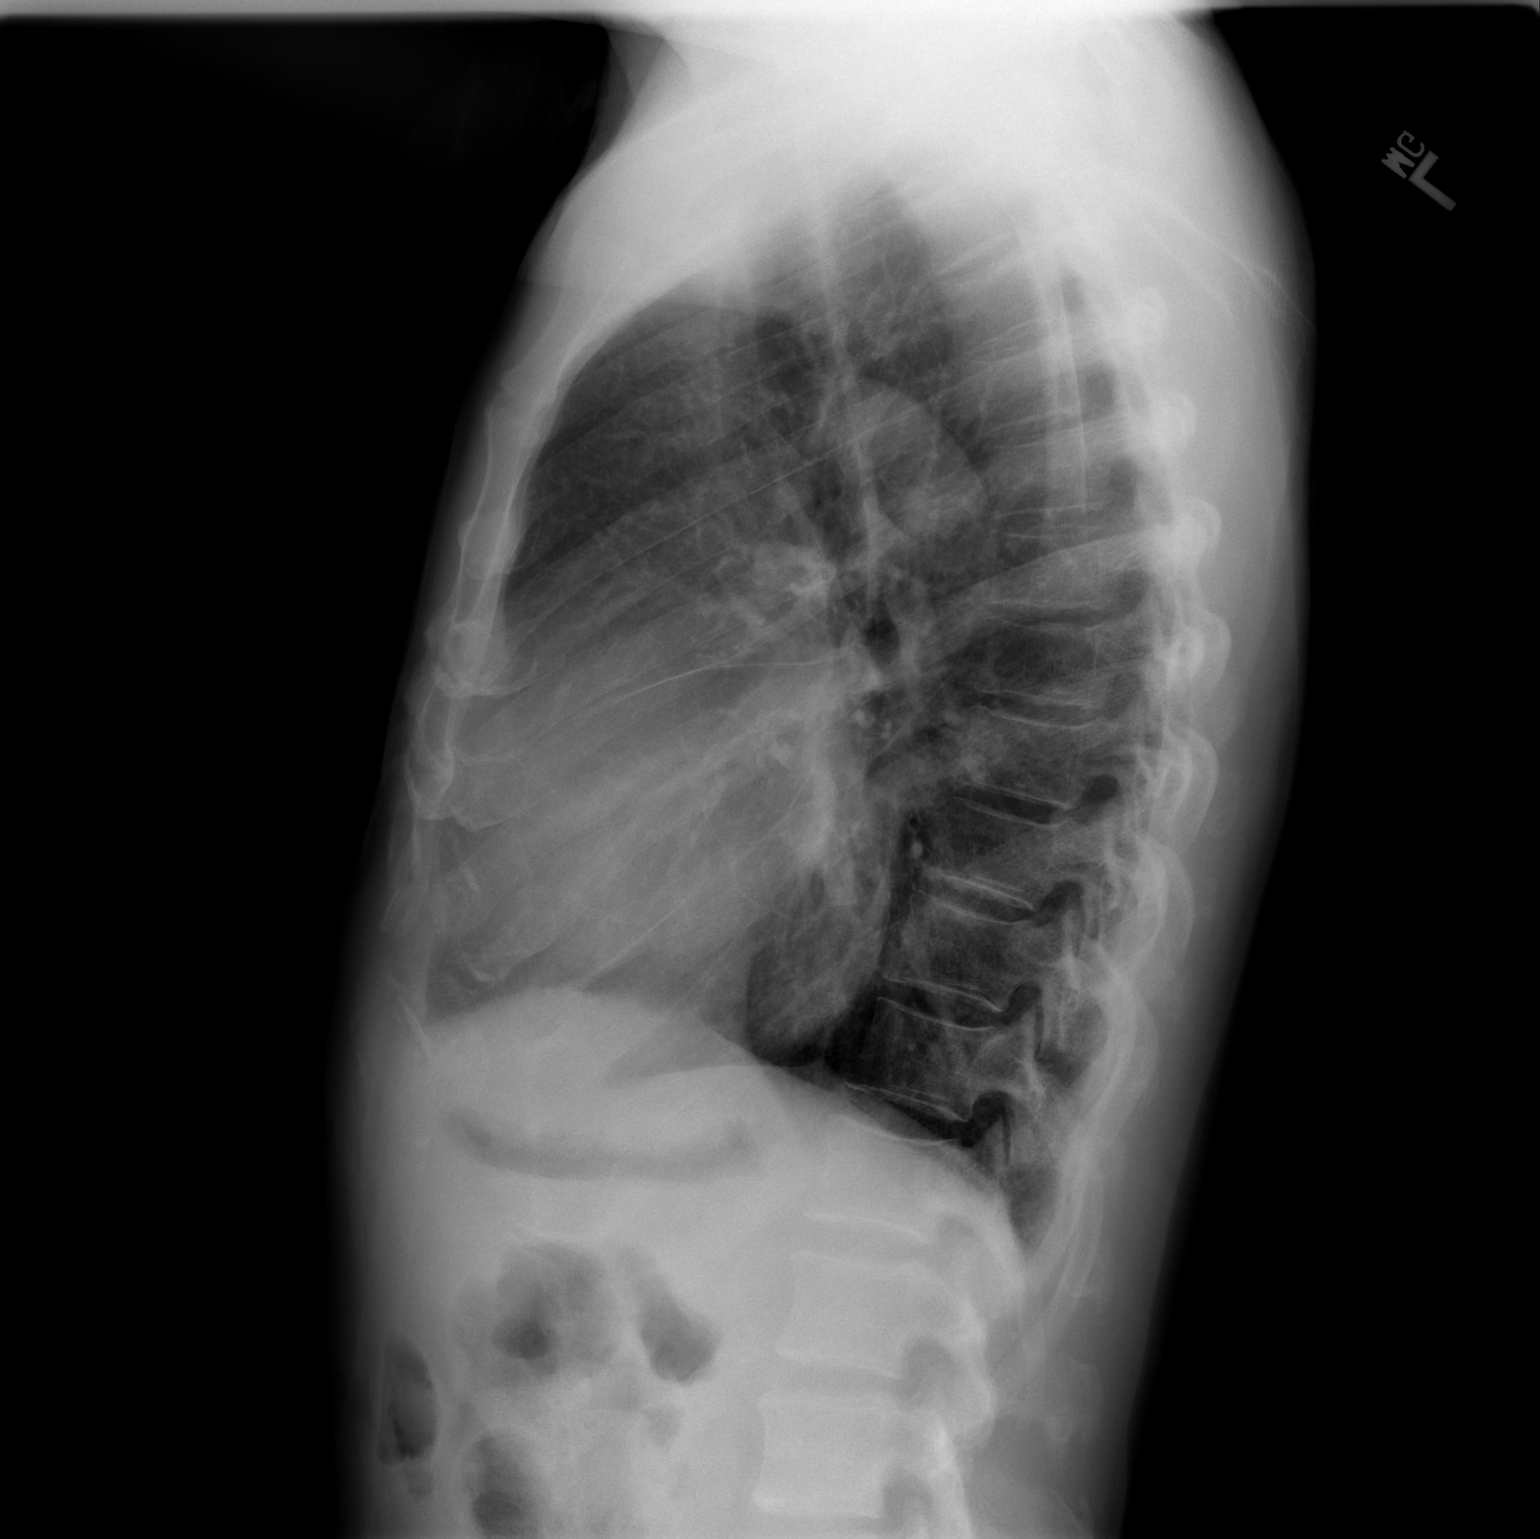

[2 of 2 positions shown; findings below may reference images not displayed]

FINDINGS: There is enlargement the cardiopericardial silhouette,
increased since prior study.  Given the configuration, pericardial
effusion cannot be excluded.  Lungs are clear.  No pleural
effusions.  No acute bony abnormality.
IMPRESSION: Enlargement of the cardiopericardial silhouette, increased since
prior study.

## 2009-04-08 ENCOUNTER — Ambulatory Visit: Payer: Self-pay | Admitting: Thoracic Surgery (Cardiothoracic Vascular Surgery)

## 2009-05-07 ENCOUNTER — Ambulatory Visit: Payer: Self-pay | Admitting: Thoracic Surgery (Cardiothoracic Vascular Surgery)

## 2010-07-29 LAB — BASIC METABOLIC PANEL
BUN: 11 mg/dL (ref 6–23)
BUN: 10 mg/dL (ref 6–23)
CO2: 23 mEq/L (ref 19–32)
CO2: 27 mEq/L (ref 19–32)
Calcium: 8.7 mg/dL (ref 8.4–10.5)
Calcium: 8.1 mg/dL — ABNORMAL LOW (ref 8.4–10.5)
Chloride: 107 mEq/L (ref 96–112)
Creatinine, Ser: 1.17 mg/dL (ref 0.4–1.5)
Creatinine, Ser: 1.18 mg/dL (ref 0.4–1.5)
GFR calc Af Amer: >60 mL/min (ref >60)
GFR calc non Af Amer: >60 mL/min (ref >60)
GFR calc non Af Amer: >60 mL/min (ref >60)
Potassium: 3.5 mEq/L (ref 3.5–5.1)
Sodium: 140 mEq/L (ref 135–145)

## 2010-07-29 LAB — COMPREHENSIVE METABOLIC PANEL
Albumin: 2.3 g/dL — ABNORMAL LOW (ref 3.5–5.2)
BUN: 10 mg/dL (ref 6–23)
Calcium: 7.9 mg/dL — ABNORMAL LOW (ref 8.4–10.5)
Creatinine, Ser: 1.07 mg/dL (ref 0.4–1.5)
GFR calc Af Amer: >60 mL/min (ref >60)
Total Protein: 5.6 g/dL — ABNORMAL LOW (ref 6.0–8.3)

## 2010-07-29 LAB — CARDIAC PANEL(CRET KIN+CKTOT+MB+TROPI)
CK, MB: 1.1 ng/mL (ref 0.3–4.0)
Relative Index: INVALID (ref 0.0–2.5)
Total CK: 85 U/L (ref 7–232)
Troponin I: 0.03 ng/mL (ref 0.00–0.06)
Troponin I: 0.05 ng/mL (ref 0.00–0.06)

## 2010-07-29 LAB — CBC
HCT: 35.4 % — ABNORMAL LOW (ref 39.0–52.0)
HCT: 37.9 % — ABNORMAL LOW (ref 39.0–52.0)
MCHC: 33.6 g/dL (ref 30.0–36.0)
MCHC: 33 g/dL (ref 30.0–36.0)
MCV: 92.9 fL (ref 78.0–100.0)
MCV: 93.6 fL (ref 78.0–100.0)
Platelets: 201 10*3/uL (ref 150–400)
Platelets: 166 10*3/uL (ref 150–400)
RBC: 4.05 MIL/uL — ABNORMAL LOW (ref 4.22–5.81)
RBC: 4.19 MIL/uL — ABNORMAL LOW (ref 4.22–5.81)
RDW: 15.9 % — ABNORMAL HIGH (ref 11.5–15.5)
WBC: 3.6 10*3/uL — ABNORMAL LOW (ref 4.0–10.5)
WBC: 3.5 10*3/uL — ABNORMAL LOW (ref 4.0–10.5)

## 2010-07-29 LAB — BLOOD GAS, ARTERIAL
Bicarbonate: 23.4 mEq/L (ref 20.0–24.0)
FIO2: 0.21 %
Patient temperature: 98.6
pCO2 arterial: 33.3 mmHg — ABNORMAL LOW (ref 35.0–45.0)
pH, Arterial: 7.461 — ABNORMAL HIGH (ref 7.350–7.450)

## 2010-07-29 LAB — BODY FLUID CULTURE: Culture: NO GROWTH

## 2010-07-29 LAB — LACTATE DEHYDROGENASE, PLEURAL OR PERITONEAL FLUID

## 2010-07-29 LAB — ABO/RH: ABO/RH(D): A POS

## 2010-07-29 LAB — BODY FLUID CELL COUNT WITH DIFFERENTIAL
Eos, Fluid: 0 %
Monocyte-Macrophage-Serous Fluid: 93 % — ABNORMAL HIGH (ref 50–90)
Neutrophil Count, Fluid: 5 % (ref 0–25)
Total Nucleated Cell Count, Fluid: 140 cu mm (ref 0–1000)

## 2010-07-29 LAB — CULTURE, BLOOD (ROUTINE X 2): Culture: NO GROWTH

## 2010-07-29 LAB — PROTEIN, BODY FLUID: Total protein, fluid: 5.4 g/dL

## 2010-09-06 NOTE — Assessment & Plan Note (Signed)
OFFICE VISIT   KEYVIN, RISON  DOB:  [birth year]                                        April 08, 2009  CHART #:  [id number]   HISTORY OF PRESENT ILLNESS:  The patient is a 50 year old gentleman with  scleroderma.  He presented with chest pain back in August and was found  to have significant right coronary lesion in early September on  catheterization by Dr. [name].  He had a large pericardial effusion and  Dr. [name] was concerned about risk for that causing bleeding with  stenting and anticoagulation.  We did a subxiphoid pericardial window.  He subsequently had a stent placed.  Recently, he has had a chest x-ray  which showed increasing size of his pericardial silhouette.  He denies  any shortness of breath or peripheral edema, but he did have a repeat  echocardiogram on April 02, 2009, the report of which showed a large  pericardial effusion which measured 2.58 cm which is much bigger than  his initial postoperative echo.  There was no sign of tamponade.  Of  note, on his studies previously his pathology from the pericardium just  showed chronic inflammation and the LDH was elevated in the fluid.   The patient was adamant that he is not having any chest pain, shortness  of breath, or decreased exercise tolerance.  He also denies any  peripheral edema.  He has not had any fevers, chills, or sweats.   CURRENT MEDICATIONS:  1. Aspirin 325 mg daily.  2. Colchicine 0.6 mg daily.  3. Ensure t.i.d.  4. Imdur 30 mg daily.  5. Protonix 40 mg daily.  6. Simvastatin 40 mg p.o. at bedtime.  7. Plavix 75 mg daily.  8. Allopurinol 100 mg daily.  9. Bystolic 20 mg daily.  10.Ramipril 5 mg daily.   ALLERGIES:  He has no known drug allergies.   REVIEW OF SYSTEMS:  See HPI, otherwise negative.   PHYSICAL EXAMINATION:  The patient is a 50 year old gentleman in no  acute distress.  His blood pressure is 164/95, pulse 68, respirations  are 18.  His  midline incision is well healed and connects with an  abdominal incision.  His cardiac exam has a regular rate and rhythm.  There is some distant nature to the heart sounds, but they are heard  better than would be expected given the size of the effusion.  There is  no rub.  His lungs are clear with no rub bilaterally.   IMPRESSION:  The patient is a 50 year old gentleman with recurrent  pericardial effusion.  This was inflammatory previously and still is and  related to his scleroderma.  Our options would be to repeat a  pericardial window, but again there is no guarantee that the fluid would  not recur.  Other consideration would be to try a trial of steroids to  see if we could decrease the inflammation and ultimately eliminate the  underlying cause for the pericardial effusion.  I discussed the options  with the patient.  Basically even with a window into the pleural space,  there is still potential for recurrent effusion.  He wishes to try a  trial of steroids.  He can start with a 15 mg tapering to 10 mg over 5  days and then he will stay on 10 mg  for about 2 weeks thereafter.  Then,  I will plan to see him back in about a month.  I will talk to Dr.  [name] about arranging an echocardiogram to see if there has  been any effect.   Revonda Standard [name], M.D.  Electronically Signed   SCH/MEDQ  D:  04/08/2009  T:  04/09/2009  Job:  [id number]   cc:   [name], M.D.  [name], M.D.

## 2010-09-06 NOTE — Assessment & Plan Note (Signed)
[facility]   NAME:  [name], [name]             ACCOUNT NO.:  [id number]   MEDICAL RECORD NO.:  [id number]      DATE OF BIRTH:  1960-05-25   PHYSICIAN:  Joneen Boers A. Nils Pyle, M.D. VISIT DATE:  09/27/2007                                   OFFICE VISIT   SUBJECTIVE:  [name] is a 50 year old man who we have followed for  multiple digital ulcerations with the suspicion that the patient has a  collagen vascular abnormality.  We asked him to discontinue tobacco use  as we felt that its use was contributing.  He has since been evaluated  by Dr. Dagmar Hait and has had a battery of immunological preliminary studies  performed which suggest further studies should be conducted.  He is  scheduled to be seen by Dr. Estanislado Pandy for continuation of his  evaluation.   In the interim, his wounds have improved.  He has not been able to told  eliminate tobacco use.  He has occasional pain, but this is much  decreased.  He continues to dress his wounds.  There has been decreased  drainage.  There has been no fever.  His pain is well controlled with  over-the-counter medications.   OBJECTIVE:  He is accompanied by his wife.  Blood pressure is 175/113,  respirations 16, pulse rate 83, temperature is 98.2.  Inspection of the  hands shows that wound #11 of the second digit of the right hand is  persistent.  It measures approximately a centimeter in diameter.  There  is a halo of advancing epithelium.  There is no malodorous drainage.  The tissue looks well perfused.  Wound #9 of the fifth digit of the left  hand has resolved.   ASSESSMENT:  Clinical improvement concurrent with significant reduction  in tobacco use.   PLAN:  We have encouraged the patient to continue his abstinence from  tobacco and to strive toward complete cessation.  He is to continue his  consultation as recommended by Dr. Dagmar Hait with Dr. Estanislado Pandy.  We have  instructed him in local care utilizing antibacterial soaps  and try  antibiotic ointment with tube fishnet gauze dressing.  We have  demonstrated this technique.  Both he and his wife feel comfortable.  We  have given an opportunity to ask questions.  They expressed gratitude  for having been seen in the clinic and indicate that they will be  compliant as per above.  The patient is discharged.      Harold A. Nils Pyle, M.D.  Electronically Signed     HAN/MEDQ  D:  09/27/2007  T:  09/27/2007  Job:  [id number]   cc:   [name], M.D.  Sanjeev K. Estanislado Pandy, M.D.

## 2010-09-06 NOTE — Assessment & Plan Note (Signed)
OFFICE VISIT   [name], [name]  DOB:  06/19/1960                                        May 07, 2009  CHART #:  [id number]   The patient returns in followup of pericardial effusion.  He is a 50-  year-old gentleman with scleroderma who had a pericardial window for a  large pericardial effusion prior to an angioplasty back in September.  I  saw him back in the office in December, at which time he had a chest x-  ray which showed an increase size of his pericardial silhouette.  He was  asymptomatic, but an echocardiogram showed a recurrent pericardial  effusion, which measured about 2.6 cm.  We discussed our options at that  time, he did not want to have any surgery for a redo pericardial window  either with a subxiphoid or a left chest approach.  We have only opted  to treat him with anti-inflammatory steroids.  He was given a steroid  taper and was treated with this for about 2 weeks and then had a repeat  echocardiogram yesterday.  The patient states that he has been feeling  fine.  He has no chest pain, no shortness of breath, no peripheral edema  of any interventions done.   His current medications are:  1. Aspirin 325 mg daily.  2. Colchicine 0.6 mg daily.  3. Ensure t.i.d.  4. Imdur 30 mg daily.  5. Protonix 40 mg daily.  6. Simvastatin 40 mg daily.  7. Plavix 75 mg daily.  8. Allopurinol 100 mg daily.  9. Bystolic 20 mg daily.  10.Ramipril 5 mg daily.   He has no known drug allergies.   On physical examination, the patient is a 50 year old gentleman, in no  acute distress.  His blood pressure is 148/91, pulse is 77, and  respirations are 18.  His cardiac sounds are easily heard.  He has  regular rate and rhythm.  Normal S1 and S2.  There is no rub, murmur, or  gallop.  He has no jugular venous distention.  He has no peripheral  edema.   Echocardiogram done at [facility] yesterday showed moderate  pericardial effusion, but decreased  in size from the previous study and  it measures 2.1 cm and there is no evidence of compromise.   IMPRESSION:  The patient is a 50 year old gentleman with scleroderma  with recurrent pericardial effusion.  This is responded to anti-  inflammatory treatment.  There is no indication for surgery at this  point in time.  I did discuss our options with him, which be long-term  steroid treatment which I would like to avoid if at all possible.  He  has been on hydrochlorothiazide in the past and that was stopped after  his pericardial window.  His blood pressure is elevated chronically and  even today, on multiple medications.  He is still at 150/90 and I think  he would probably benefit from hydrochlorothiazide just from the  standpoint of blood pressure management.  We are going to try that at 25  mg a day.  I gave him a prescription for a month and 2 refills if  needed.  He is going to take that for a month, then stop and see if  there is any difference in his blood pressure or how he feels overall.  I would be happy to see the patient back anytime if I could be of any  further assistance with his care.  I do not think at this point he needs  a scheduled echo, but we will leave that up to Dr. Kennon Holter discretion if  he thought it was necessary.  Otherwise, I will just repeat an echo if  he develop any kind of symptoms of chest discomfort or shortness of  breath or edema.   [name], M.D.  Electronically Signed   SCH/MEDQ  D:  05/07/2009  T:  05/08/2009  Job:  [id number]   cc:   [name], M.D.  [name], M.D.

## 2010-09-06 NOTE — Assessment & Plan Note (Signed)
[facility]   NAME:  [name], MAKRIS             ACCOUNT NO.:  [id number]   MEDICAL RECORD NO.:  [id number]      DATE OF BIRTH:  [birth year]   PHYSICIAN:  Joneen Boers A. Nils Skinner, M.D.      VISIT DATE:                                   OFFICE VISIT   SUBJECTIVE:  [name] is a 50 year old man who was initially seen in  the [facility] in May 2007.  At that time, he complained of  ulcerations on the digits and had responded partially to antibiotics.  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   He was last seen in the [facility] in June 2007, and was discharged  with all of the digital wounds completely resolved.  He was advised to  continue to wear gloves at all times avoiding trauma.  The patient has a family history of lupus.   In the interim, he has done well until 3 months ago when he noted  increasing pain with cold weather and began to have breakdown over the  third finger of the right hand.  He has had no fever.  He has had no  excessive drainage or malodor.  He continues to smoke.  His complete  metabolic profile had been unremarkable when assayed by Dr. Dagmar Hait.  His  medications have not been significantly altered.  He remains on  atenolol, Synthroid, colchicine, Mobic, and Bystolic.  He has had no  interim surgery.   OBJECTIVE:  VITAL SIGNS:  Blood pressure is 117/96, respirations of 20,  pulse rate is 75, temperature of 98.3.  He is accompanied by his wife.  HEENT:  Clear.  NECK:  Supple.  Trachea is midline.  There are no carotid bruits.  HEART:  The heart sounds are distant.  ABDOMEN:  Soft.  EXTREMITIES:  Warm.  There are 3+ bilateral dorsalis pedis pulses.  The  radial pulses are full at 3+ with negative Adson sign.  On close  examination of the skin and fingers, there is mapping of all the  patient's digits.  On the left fifth digit, is a clear ulceration with  granulation and minor  desquamation.  Ulcer is moderately painful.  It is  at the tip of the finger.  The skin proximal to the ulcer is shiny and  excoriated.  Similar ulcerations are found on the right ring finger and  the right index finger.  Examination of the toes shows no ulceration  whatsoever.  There is no axillary adenopathy.  There is no evidence of  recent trauma.   ASSESSMENT:  Microvascular angiopathy with digital ulceration.   RECOMMENDATIONS:  We have advised the patient to abstain from all  tobacco products for initially 1 month and certainly he is to procure  and use an over-the-counter Tri-Antibiotic ointment.  We have instructed  him in the use of antibacterial/antiseptic soap washes to these ulcers  on the hand and to use a white cotton glove inside of the leather glove,  it work for protection.  He should have no fever and there should be no  swelling.   We have indicated that our clinical suspicion is that he has an  equivalent of Raynaud's phenomenon that is  aggravated intensely by  tobacco.  In the future, we will do an immunological profile to include  protein C and S with  rheumatoid factors.  We will suggest this to Dr. Dagmar Hait or suggest a  referral to one of the local rheumatologist.  We have given the patient  and his wife an opportunity to ask questions.  They both seem to  understand.  The patient will be compliant.      [name], M.D.  Electronically Signed     HAN/MEDQ  D:  08/01/2007  T:  08/02/2007  Job:  [id number]   cc:   [name], M.D.

## 2010-09-06 NOTE — Assessment & Plan Note (Signed)
[facility]   NAME:  [name], [name]             ACCOUNT NO.:  [id number]   MEDICAL RECORD NO.:  [id number]      DATE OF BIRTH:  [birth year]   PHYSICIAN:  Joneen Boers A. Nils Pyle, M.D. 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  In the interim, we  asked him to stop smoking and to use an antibacterial soap and topical  Neosporin on the previous wounds.  He returns with his wife.  He reports  that one of the wounds is completely resolved and the others are  markedly better and he experiences less pain.  He has not completely  stopped smoking, but he has cut way back.  There has been no interim  fever.   OBJECTIVE:  Blood pressure is 177/100, respirations 16, pulse rate 69,  temperature is 98.3.  Inspection of the hands show that previous wound  #10 of the right fourth digit is completely resolved.  There has been  considerable contraction of wound #9 on the left fifth digit, and wound  #11 on the right second digit remains moderately open and moist.  There  is no malodor.  There is no drainage.  The radial pulse remains  palpable.   ASSESSMENT:  Clinical improvement of possible Raynaud's phenomenon.   PLAN:  We will encourage the patient to abstain from tobacco usage.  He  will continue the local care and topical Neosporin.  In the interim, he  will keep his appointments with Dr. Shanda Bumps.  We recommend a collagen  vascular workup by one of the local rheumatologists in an effort to  define a reversible component to his pathology.  We have explained this  assessment to the patient and his wife in terms they seem to understand.  We have given an opportunity to ask questions.  They expressed so  gratitude for having been seen in the clinic and indicate that they will  be compliant.  We will re-evaluate him in 1  month.      Harold A. Nils Pyle, M.D.  Electronically Signed     HAN/MEDQ  D:  08/30/2007  T:  08/31/2007  Job:  [id number]

## 2010-09-06 NOTE — Assessment & Plan Note (Signed)
OFFICE VISIT   [name], [name]  DOB:  1960-11-12                                        January 11, 2009  CHART #:  [id number]   The patient is a 50 year old gentleman who presented with positive  stress Myoview.  He had a critical lesion in his distal right coronary  and had a large pericardial effusion, Dr. [name] was concerned about  attempting angioplasty with high levels of anticoagulants in the setting  of this pericardial effusion.  We did a subxiphoid pericardial window on  him to drain the effusion.  He did very well from that.  He subsequently  has come back and had his stent placed.   CURRENT MEDICATIONS:  Aspirin 325 mg daily, colchicine 0.6 mg daily,  Ensure t.i.d., Imdur 30 mg daily, oxycodone p.r.n., Protonix 40 mg  daily, simvastatin 40 mg daily, Plavix 75 mg daily, allopurinol 100 mg  daily, Bystolic 20 mg daily and ramipril 5 mg daily.   He has no known drug allergies.   PHYSICAL EXAMINATION:  General:  The patient is a well-appearing 50-year-  old gentleman in no acute distress.  Vital Signs:  Blood pressure is  145/91, pulse 74, respirations are 18.  Chest:  His incision is clean,  dry and intact.  There was a small amount suture protruding inferiorly,  the knot was excised.  There is no signs of infection.   Chest x-ray shows good aeration of the lungs bilaterally, there has been  resolution of left lower lobe atelectasis and left pleural effusion.   IMPRESSION:  The patient is a 50 year old gentleman status post  subxiphoid pericardial window.  His wounds are healing well at this  point in time, he is having minimal discomfort.  I did advise him to  avoid any heavy lifting which also means that he will have to be out of  work  for the next 4 weeks.  He may return to work on October 11 and will not  have any physical restrictions at that time.   Revonda Standard [name], M.D.  Electronically Signed   SCH/MEDQ  D:  01/11/2009   T:  01/12/2009  Job:  [id number]

## 2010-09-06 NOTE — Cardiovascular Report (Signed)
[name], [name] NO.:  [id number]   MEDICAL RECORD NO.:  [id number]          PATIENT TYPE:  INP   LOCATION:  2503                         FACILITY:  [facility]   PHYSICIAN:  [name], M.D.   DATE OF BIRTH:  01/04/1961   DATE OF PROCEDURE:  12/24/2008  DATE OF DISCHARGE:                            CARDIAC CATHETERIZATION   PROCEDURE:  Right and left heart cath.   [name] is a 50 year old thin-appearing married African American  male with history of scleroderma and known pericardial effusion, which  we have been followed by 2-D echocardiography.  His other problems  include hypertension and low HDL.  I last saw him in May.  He recently  saw Dr. Elisabeth Cara as an add-on because of chest pain.  The pain did occur  after eating as well during periods of stress and has awakened from  sleep.  It did improve with Prilosec OTC.  Dr. Elisabeth Cara noted changes in  electrocardiogram with new inferior T-wave inversion as well as lateral  T-wave inversion.  A 2-D echo showed mild inferior wall hypokinesia with  an increase in pericardial effusion at 2 cm; however, he did not have  tamponade physiology.  Myoview stress test showed inferior scar without  ischemia.  He presents now for right and left heart cath to define his  anatomy and physiology.   DESCRIPTION OF PROCEDURE:  The patient was brought to the second floor  [facility] in the postabsorptive state.  He was  premedicated with p.o. Valium, IV Versed and fentanyl.  His right groin  was prepped and shaved in the usual sterile fashion.  Xylocaine 1% was  used for local anesthesia.  A 5-French sheath was inserted into the  right femoral artery using standard Seldinger technique.  A 7-French  sheath was inserted into the right femoral vein.  The 7-French balloon-  tip thermodilution Swan-Ganz catheter was used to obtain sequential  right heart pressures.  A 5-French right and left Judkins diagnostic  catheter as well as 5-French pigtail catheter were used for selective  coronary angiography and left ventriculography respectively.  Visipaque  dye was used for the entirety of the case.  Retrograde aortic, left  ventricular, and pullback pressures were recorded.   HEMODYNAMICS:  1. Aortic systolic pressure 675, diastolic pressure 91.  2. Left ventricular systolic pressure 916, end-diastolic pressure 11.  3. RA pressure A-wave 14, V-wave 14, mean 12.  4. RV systolic 60 and diastolic pressure 13.  5. Pulmonary artery pressure systolic 56, diastolic pressure 21.  6. Pulmonary capillary wedge pressure A-wave 24, V-wave 21, and mean      20.  There was a rapid Y descent noted.   SELECTIVE CORONARY ANGIOGRAPHY:  1. Left main normal.  2. LAD; LAD had minor irregularities with a calcific shelf in the      proximal portion that did not impinge on the lumen.  3. Left circumflex; minor irregularities./  4. Right coronary artery; dominant vessel with 50% segmental proximal      stenosis.  There is a 95% eccentric distal stenosis at the crux  just before the PDA takeoff.  There is a 50% segmental proximal PLA-      1 stenosis.  5. Left ventriculography; RAO left ventriculogram was performed using      25 mL of Visipaque dye at 12 mL per second.  The overall LVEF was      estimated at greater than 60% without focal wall motion      abnormalities.   IMPRESSION:  Mr. Shrum has a high-grade distal dominant right  coronary artery stenosis, most likely responsible for his chest pain,  EKG changes, and wall motion abnormality on echocardiography.  While  this is percutaneously addressable, I am concerned about putting a stent  in him, requiring aspirin and Plavix and converting his already moderate  pericardial effusion into a hemorrhagic effusion and possibly tamponade.  I think we should decompress his pericardium surgically by putting a  subxiphoid window and then stage his right coronary  artery intervention  to follow.   The sheaths were removed and pressure was held on the groin to achieve  hemostasis.  The patient left the lab in stable condition.  TCTS was  consulted.      [name], M.D.  Electronically Signed     JB/MEDQ  D:  12/24/2008  T:  12/25/2008  Job:  [id number]   cc:   [facility]  [facility]  [name], M.D.  Tery Sanfilippo, MD

## 2010-09-06 NOTE — Consult Note (Signed)
NAMECHRISTERPHER, CLOS             ACCOUNT NO.:  [id number]   MEDICAL RECORD NO.:  [id number]          PATIENT TYPE:  INP   LOCATION:  2503                         FACILITY:  [facility]   PHYSICIAN:  Revonda Standard. [name], M.D.DATE OF BIRTH:  10-11-1960   DATE OF CONSULTATION:  12/24/2008  DATE OF DISCHARGE:                                 CONSULTATION   REASON FOR CONSULTATION:  Large pericardial effusion.   HISTORY OF PRESENT ILLNESS:  Mr. Oren is a 50 year old gentleman who  presented with a chief complaint of chest pain.  He has a history of  scleroderma.  He has had a prior pericardial effusion which was treated  medically and follow up with 2-D echocardiography.  He also has a  history of dyslipidemia and hypertension.  Recently, he has been  complaining of chest pain.  This occurred with walking and also has had  episodes after eating and has been waking him at night from his sleep,  improved somewhat with Prilosec OTC, but still has exertional component.  Dr. Elisabeth Cara saw him on consultation.  His echocardiogram showed a new T-  wave inversion inferolaterally.  2-D echo showed some anterior wall  hypokinesia.  There was a moderate pleural effusion 2 cm in  circumference, but no definite tamponade.  He had a stress Myoview which  showed no ischemia.  He was advised to undergo right and left heart  catheterization which was done today.  He had a 95% distal right  coronary artery stenosis.  His right atrial pressure was 12.  Right  ventricular pressure was 60/10.  PA was 56/21 and his wedge was 20 with  no evidence of cardiac tamponade.  The patient currently is pain free  and denies any shortness of breath.  He does have some leg swelling  secondary to gout in his knee.   His medications prior to admission are:  1. Bystolic 20 mg daily.  2. Ramipril 5 mg daily.  3. Vicodin p.r.n.  4. Colchicine p.r.n.  5. Allopurinol 100 mg daily.  6. Aspirin 81 mg daily.  7. Prilosec 20 mg  daily.   He has no known drug allergies.   His family history is noncontributory.   SOCIAL HISTORY:  He lives with his wife.  He is a smoker.  He works as a  Development worker, community.   REVIEW OF SYSTEMS:  He complains of right knee pain and right leg  swelling due to gout.  Chest pain as previously noted.  Questionable  indigestion.  Poor wound healing.  All other systems negative.   PHYSICAL EXAMINATION:  GENERAL:  Mr. Kirk is a 50 year old African  American male in no acute distress.  VITAL SIGNS:  Blood pressure is 117/82, pulse 84, respirations are 20,  his temperature is 90 degrees Fahrenheit, and his oxygen saturation 97%  on room air.  He is thin.  NEUROLOGICAL:  He is alert and oriented x3 with no focal deficits.  NECK:  Supple without jugular venous distention.  CARDIAC:  Regular rate and rhythm.  Normal S1 and S2.  There is no rub  or murmur.  ABDOMEN:  Soft.  EXTREMITIES:  There is no pitting edema.   LABORATORY DATA:  His white count is 3.6, hematocrit 38, and platelets  181.  PT 14.9, PTT 31.9, glucose 92, BUN 9, creatinine 0.9, sodium 147,  and potassium 4.5.   IMPRESSION:  Mr. Matousek is a 50 year old gentleman who has a  moderately large pericardial effusion without evidence of tamponade at  the present time, likely due to sclerodermatous.  This has been treated  medically in the past.  He now presents with chest pain which he is  fairly stoic about this and this is a little bit hard to characterize,  but has had some symptoms even without exertion and a catheterization  has a critical stenosis in his distal right coronary artery for which he  needs percutaneous intervention which will require anticoagulation.  Dr.  [name] is understandably concerned about using aggressive anticoagulation  in the setting of a moderately large pericardial effusion to begin with  and has asked that we consider a subxiphoid pericardial window to drain  the fluid prior to  angioplasty.  There is some residual risk of bleeding  into the pericardium after subxiphoid pericardial window, but it will  provide a little bit more margin for error.  I did discuss with the  patient and his wife the indications, risks, benefits, and alternatives  which include medical treatment of both lesions or angioplasty without a  window prior.  They understand their risk with each approach.  They  understand the risks of subxiphoid pericardial window include but are  not limited to complications related to general anesthesia, death, MI,  bleeding, possible need for transfusions, infections, and other wound  healing problems as well as the risk of recurrent effusion.  He  understands and accepts the risk and wishes to proceed pericardial  window prior to angioplasty.      Revonda Standard [name], M.D.  Electronically Signed     SCH/MEDQ  D:  12/24/2008  T:  12/25/2008  Job:  [id number]   cc:   [name], M.D.  Tery Sanfilippo, MD  [name], M.D.

## 2010-09-09 NOTE — Consult Note (Signed)
[name], [name]             ACCOUNT NO.:  [id number]   MEDICAL RECORD NO.:  [id number]          PATIENT TYPE:  OUT   LOCATION:  XRAY                         FACILITY:  [facility]   PHYSICIAN:  Orlando Penner. Sevier, M.D. DATE OF BIRTH:  [birth year]   DATE OF CONSULTATION:  08/22/2005  DATE OF DISCHARGE:                                   CONSULTATION   HISTORY:  This 50 year old black was male seen at the courtesy of Dr. Dagmar Hait  for assistance with management of multiple wounds about the hands.   The patient is not diabetic and has no known disease that might predispose  him to vasculitis.  He has no history of cold sensitivity in the hands or  elsewhere.  He was until one-month ago a smoker one pack per day but says he  has been able to give this up entirely since that time.   His employment is as a heavy Company secretary and he apparently refuses to  use gloves in that occupation.  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  He has a habit of washing the grease and so  forth off his hands with gasoline.   It is with that background history that approximately two months ago the  patient began to note the appearance of repeated open sores on various areas  on his hands sometimes in association with apparent injury, other times not.  These have never been associated with fever or systemic symptoms and there  has been some nonspecific swelling in the hands.   He was seen by Dr. Dagmar Hait and treated with topical application of Neosporin  and with a 10-day course of Augmentin XR 875 mg b.i.d.  These things  apparently made some limited improvement but certainly have not resolved his  problems.  He is here today for further evaluation and advice.   It is noted that the lesions are painful but not to the extent that he  requires pain medication.  They are, particularly in certain stages,  tender  to the touch.   PAST MEDICAL HISTORY:  Notable primarily for gout which has involved  primarily his foot and knee and for this he takes colchicine.  He also has  hypertension and hypothyroidism.   MEDICATIONS:  1.  Atenolol 50 mg b.i.d.  2.  Synthroid 75 mcg daily.  3.  Colchicine 0.6 mg t.i.d. p.r.n. for his gout.  4.  Mobic 15 mg daily.  5.  Lorazepam 0.5 mg every 8 hours as needed.  6.  Neosporin as indicated in the present illness.  7.  He has been given the Vicodin 5/500 to take on a p.r.n. basis for his      hand problems but has used this sparingly and is not taking it at all at      the present.   EXAMINATION:  Examination today is limited to the hands.  The hands are  normal in temperature and are minimally swollen.  Pulses are clearly  palpable  in both the radial and ulnar areas.  There does not appear to be  undue blanching of the fingertips and in fact capillary refill appears to be  reasonably good.   There are no gross joint abnormalities in the hand.   Monofilament testing shows that he has protective sensation throughout the  hands.   There are multiple wounds on the hands which are too numerous to measure but  these have been documented by photography.  There is a lesion on the tip of  the left thumb, another on the side of the PIP joint of the index finger of  the left hand this on its radial side.  There is an ulcer just under the  nail of the tip of the left third finger and there is an ulcer at the tip of  the pulp of the left fifth finger with some stiffness and swelling extending  proximally in that finger as well.  On the left hand, on the right hand  there is an infection at the tip of the thumb.  On the dorsal and radial tip  of the index finger at the dorsal DIP joint of the ring finger of that hand  and at the tip of the ulnar side of the fifth digit on the right.   IMPRESSION:  Multiple lesions of the hands, likely traumatic in origin,  with  possible superinfection.  Obviously given this clinical picture, there must  be concern about some microvascular problem vasculitis, etc., and it is  unknown to this examiner whether the patient has been worked up for lupus  cryoglobulins dysproteinemia and so forth.  The patient states he has had no  specific vascular evaluation in his hands.  He does deny classic Raynaud's  phenomenon.   DISPOSITION:  1.  It is pointed out to the patient that we think it wise that he not work      for the present until we achieve significant progress in these wounds if      not total healing.  He is given appropriate note for work in this      regard.  2.  After all lesions were pretreated with topical Xylocaine, he under goes      minor debridement of tiny pieces of loose skin and so forth in several      of these areas but certainly nothing that constitutes any full-thickness      debridement.  3.  The wounds were treated here with an application of Bactroban and gauze      wraps.  4.  The patient is sent for x-rays of his hands which show two small      metallic foreign bodies in the left hand which appear to be of no      significant are not near areas of ulceration.  There is loss of the      distal tuft of the middle finger on that side which appears to be      related more chronic trauma then to osteomyelitis.  In the right hand,      there is partial loss of the distal phalanx of the middle finger and      there are significant what appeared be post-traumatic changes of the DIP      joint of the ring finger.  Again, no osteomyelitis, deep abscess or      tendon sheath infection is apparent from the x-rays.   IMPRESSION:  1.  The patient and  his wife are advised that at home he should cleanse      these wounds daily with warm soapy water using Dial soap.  They are then      to be rinsed well and patted dry and subjected to reapplication of     Bactroban and reasonably bulky gauze  dressings.  2.  As previously point out, the patient has been given a note to remove him      from work for the time being.  3.  Discussion will be held with Dr. Dagmar Hait suggesting that if workup for      bases for vasculitis or coagulation difficulties has not been done that      perhaps it should be and also that it may be that he will need a      vascular evaluation with transcutaneous oxygen determinations in the      fingertips or something of that sort as time goes on.  It will also be      discussed that perhaps the atenolol which can have some vasoconstrictive      effect in the tiny      capillaries of the fingertips might be changed to another agent such as      Catapres, doxazosin, reserpine or an ARB.  4.  Follow-up visit is given to the patient to be seen here again in two      weeks.           ______________________________  Orlando Penner London Pepper, M.D.     RES/MEDQ  D:  08/22/2005  T:  08/22/2005  Job:  [id number]   cc:   [name], M.D.  Fax: [phone]

## 2010-09-09 NOTE — Assessment & Plan Note (Signed)
[facility]   NAME:  [name], [name]             ACCOUNT NO.:  [id number]   MEDICAL RECORD NO.:  [id number]      DATE OF BIRTH:  January 09, 1961   PHYSICIAN:  Orlando Penner. Sevier, M.D.  VISIT DATE:  08/30/2005                                     OFFICE VISIT   HISTORY:  This 50 year old black male is seen for follow-up of multiple  traumatic ulcerations of the hand, for which he was initially seen on May 1.  At that time the x-rays of the hand were made to exclude any osteomyelitis  or deep infection or hidden fractures, and none were found.  In addition,  one of the wounds was cultured and did indeed grow MRSA, and the patient has  been on doxycycline 100 mg daily and will continue this for a total course  of 30 days, which means an extension of approximately 23 days hence.   The patient arrives reporting improvement in all wounds, although there is  considerable tenderness yet particularly in the wound of the left fifth  digit.   He has had no intercurrent illness and no other change in medications in the  interim.   Evaluation of his wounds today is done after the application of topical 4%  lidocaine to all areas because of their tenderness and sensitivity.   He has a total of eight wounds distributed between the two hands, which are  described in detail in the chart to include with measurements.  All of these  have made satisfactory progress since the last visit and the only  debridement required today is a full-thickness debridement of an area of  necrotic tissue at the distal tip of the left fifth finger.  It is felt that  this nail will eventually be lost as well but remains in place now and is  left there for his protection.   A partial-thickness debridement is required on the index finger of the right  hand where the nail has already been traumatically removed and there some  loose skin and a tiny portion of remaining nail is debrided.  All wounds  were  treated with reapplication of Bactroban ointment and wrapped in dry  dressings.   Instructions to the patient were to continue these dressings with cleansing  of all wounds with mild soap and water, followed by extensive rinsing each  day, redressing them, each and every wound, with Bactroban and a dry  dressing.   The patient is advised to continue his doxycycline 100 mg per day as  indicated above.   The disability form for his work was completed today with a suggestion that  he likely will be able to return to work sometime on or about June 1.   A follow-up visit here is given for two weeks but with patient and wife  instructed to call and come in sooner should there be any interim problems.   FINAL DIAGNOSIS:  Multiple traumatic ulcerations of the fingers of both  hands (total of nine wounds), secondary infection by methicillin-resistant  Staphylococcus aureus.   Incidentally, the patient's vital signs today were blood pressure 130/92,  heart rate 68, respirations 16, temperature 98.2.           ______________________________  Orlando Penner.  London Pepper, M.D.     RES/MEDQ  D:  08/30/2005  T:  08/31/2005  Job:  [id number]

## 2010-09-09 NOTE — Assessment & Plan Note (Signed)
[facility]   NAME:  [name], [name]             ACCOUNT NO.:  [id number]   MEDICAL RECORD NO.:  [id number]      DATE OF BIRTH:  12-21-1960   PHYSICIAN:  Orlando Penner. Sevier, M.D.  VISIT DATE:  10/11/2005                                     OFFICE VISIT   WOUND CARE CLINIC NOTE   DATE OF VISIT:  October 11, 2005.   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   With aggressive wound care and doxycycline therapy by mouth, he had resolved  all but one of these wounds, namely that on his right fifth finger, when  last seen.  He was allowed, at that time however, to return to work and to  use gloves at all times.   He reports that the wound had continued to progress towards healing and now  has a callus type scab over it with no pain, drainage or other symptoms.   PHYSICAL EXAMINATION:  VITAL SIGNS:  Blood pressure 126/86, pulse 76 and  regular, respirations 20, temperature 98.2.  EXTREMITIES:  Examination of the right fifth finger shows, indeed, complete  healing of the wound with some callused eschar still in place which should  take care of itself.   IMPRESSION:  Resolution of multiple hand wounds.   DISPOSITION:  The scab on the wound on the right fifth finger should take  care of itself and no specific further therapy is recommended.   The patient, again, is okayed for return to work to use gloves at all times.   FOLLOWUP:  Followup here will be on a p.r.n. basis.           ______________________________  Orlando Penner London Pepper, M.D.     RES/MEDQ  D:  10/11/2005  T:  10/11/2005  Job:  [id number]

## 2010-09-09 NOTE — Assessment & Plan Note (Signed)
[facility]   NAME:  [name], [name]             ACCOUNT NO.:  [id number]   MEDICAL RECORD NO.:  [id number]      DATE OF BIRTH:  12/19/1960   PHYSICIAN:  Orlando Penner. Sevier, M.D.  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  He has been on treatment with doxycycline and aggressive wound care.  He has been off work.   The patient reports continued improvement since his last visit.  He has one  additional doxycycline tablet to complete his 30 days of such therapy.  He  reports that all wounds are healed except those on the fifth fingers  bilaterally and the right fifth finger remains slightly tender.   PHYSICAL EXAMINATION:  VITAL SIGNS:  Blood pressure 168/110, heart rate 60  and regular, respirations 20, temperature 98.3.  The wounds indeed are all healed except for those on the fifth fingers  bilaterally.  Both of which are covered with hard eschars.  That on the left  fifth finger measures approximately 1x1x0.03 cm and is nontender.  It is  completely covered with a scab.   That on the right fifth finger measured 0.4x0.4x0.025 cm.  He is still  slightly tender to touch.  Again, it is completely scabbed over.  There is  no evidence of any surrounding inflammation.   The wounds are both considered greater than 75% healed.   DISPOSITION:  1.  [name] blood pressure is called to his attention.  He states he      has not taken his blood pressure medication for the morning.  He is      instructed to do that immediately upon return home, and also that he      should contact his physician and arrange for follow up of his blood      pressure.  2.  He is to continue treatment of his wounds with application of  Bactroban      and band aid which is done here today, and he is asked to continue that      on a daily basis until they are completely healed.  3.  He is to be allowed to return to work June 5, provided he uses gloves at      all times.  4.  He will be seen here for what should be a final visit in three weeks.           ______________________________  Orlando Penner London Pepper, M.D.     RES/MEDQ  D:  09/19/2005  T:  09/19/2005  Job:  [id number]

## 2010-09-09 NOTE — Assessment & Plan Note (Signed)
[facility]   NAME:  [name], [name]             ACCOUNT NO.:  [id number]   MEDICAL RECORD NO.:  [id number]      DATE OF BIRTH:  14-Nov-1960   PHYSICIAN:  Orlando Penner. Sevier, M.D.  VISIT DATE:  09/13/2005                                     OFFICE VISIT   HISTORY:  This 50 year old black male has been under our treatment for nine  separate wounds involving the digits of both hands, all of these traumatic  in origin.  He apparently operates heavy equipment, refuses to use gloves,  and has had multiple limited crush-type injuries.   He reports continued improvement in the wounds since his last visit.  He  says there is still considerable pain in that on the right fifth finger.   He has had no fever, chills or systemic symptoms.  He does continue on  doxycycline 100 mg daily because of MRSA which was found in that wound of  the left fifth finger.   PHYSICAL EXAMINATION:  Blood pressure 120/92, heart rate 66 and regular,  respirations 16, temperature 98.3.   All wounds are noted to be resolved, although there are crusts over that on  the tip of the left second finger and right fifth finger.  The one  unresolved wound is that on the fifth finger on the left, which is covered  by an eschar measuring 0.7 x 0.6 x 0.15 mm.  There is minimal serous  drainage there, no odor, and an advancing wound margin.   IMPRESSION:  Multiple traumatic wounds of both hands with secondary  staphylococcal infection.   DISPOSITION:  As noted above, all but a single one of these wounds is  healed.   He is treated with partial-thickness debridement of the crusts on the wounds  at the tip of the left second finger and the right fifth finger.   The wound on the left fifth finger is full-thickness debrided to remove the  eschar and it looks pretty health underneath.   That wound will be continued to be treated with an application of Bactroban  and dry dressing on a daily basis.   A  form is completed for the patient to keep him out of work until his next  visit here, which will be six days hence.           ______________________________  Orlando Penner. London Pepper, M.D.     RES/MEDQ  D:  09/13/2005  T:  09/13/2005  Job:  [id number]

## 2011-06-14 ENCOUNTER — Encounter: Payer: Self-pay | Admitting: Internal Medicine

## 2011-06-15 ENCOUNTER — Ambulatory Visit (INDEPENDENT_AMBULATORY_CARE_PROVIDER_SITE_OTHER): Payer: 59 | Admitting: Internal Medicine

## 2011-06-15 ENCOUNTER — Encounter: Payer: Self-pay | Admitting: Internal Medicine

## 2011-06-15 VITALS — BP 106/80 | HR 83 | Temp 98.2°F | Ht 68.0 in | Wt 124.0 lb

## 2011-06-15 DIAGNOSIS — J45909 Unspecified asthma, uncomplicated: Secondary | ICD-10-CM | POA: Insufficient documentation

## 2011-06-15 DIAGNOSIS — F17200 Nicotine dependence, unspecified, uncomplicated: Secondary | ICD-10-CM

## 2011-06-15 DIAGNOSIS — N189 Chronic kidney disease, unspecified: Secondary | ICD-10-CM

## 2011-06-15 DIAGNOSIS — M349 Systemic sclerosis, unspecified: Secondary | ICD-10-CM

## 2011-06-15 DIAGNOSIS — J449 Chronic obstructive pulmonary disease, unspecified: Secondary | ICD-10-CM

## 2011-06-15 NOTE — Patient Instructions (Signed)
Stop smoking is the most important aspect of your care by far.  Schedule pft's next available and return yearly for "points on the curve"

## 2011-06-15 NOTE — Progress Notes (Signed)
  Subjective:    Patient ID: [name], male    DOB: 08/16/1960, 51 y.o.   MRN: [id number]  HPI  89 yobm active smoker referred 06/15/2011 by Estanislado Pandy for baseline pft's for scleroderma.   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 Main co fingers hurt with cold weather and "get infected"  But no cough or really limiting sob.  Sleeping ok without nocturnal  or early am exacerbation  of respiratory  c/o's or need for noct saba. Also denies any obvious fluctuation of symptoms with weather or environmental changes or other aggravating or alleviating factors except as outlined above.    Review of Systems  Constitutional: Negative for fever, chills, activity change, appetite change and unexpected weight change.  HENT: Negative for congestion, sore throat, rhinorrhea, sneezing, trouble swallowing, dental problem, voice change and postnasal drip.   Eyes: Negative for visual disturbance.  Respiratory: Negative for cough, choking and shortness of breath.   Cardiovascular: Negative for chest pain and leg swelling.  Gastrointestinal: Negative for nausea, vomiting and abdominal pain.  Genitourinary: Negative for difficulty urinating.  Musculoskeletal: Negative for arthralgias.  Skin: Negative for rash.  Psychiatric/Behavioral: Negative for behavioral problems and confusion.       Objective:   Physical Exam Wt 124 06/15/2011  Pleasant think amb bm nad with classic sclerodactyly HEENT mild turbinate edema.  Oropharynx no thrush or excess pnd or cobblestoning.  No JVD or cervical adenopathy. Mild accessory muscle hypertrophy. Trachea midline, nl thryroid. Chest was min hyperinflated by percussion with diminished breath sounds and mild increased exp time without wheeze. Hoover sign positive at end inspiration. Regular rate and rhythm without murmur gallop or rub or increase P2 or edema.  Abd: no hsm, nl excursion. Ext warm without cyanosis or  clubbing.    cxr at Dr Danna Hefty office w/in last 3 months, not avail at eval    Assessment & Plan:

## 2011-06-16 DIAGNOSIS — F17200 Nicotine dependence, unspecified, uncomplicated: Secondary | ICD-10-CM | POA: Insufficient documentation

## 2011-06-16 DIAGNOSIS — N183 Chronic kidney disease, stage 3 unspecified: Secondary | ICD-10-CM | POA: Insufficient documentation

## 2011-06-16 NOTE — Assessment & Plan Note (Addendum)
-  PFT's 11/08/2007 FEV1  2.27 (70%) ratio 70 and 16% better p B2 and DLCO 72%   GOLD O in 2009 but smoked in interim so needs repeat pft's > pending  I reviewed the Flethcher curve with patient that basically indicates  if you quit smoking when your best day FEV1 is still well preserved (which his still appears to be) it is highly unlikely you will progress to severe disease and informed the patient there was no medication on the market that has proven to change the curve or the likelihood of progression.  Therefore stopping smoking and maintaining abstinence is the most important aspect of care, not choice of inhalers or for that matter, doctors.

## 2011-06-16 NOTE — Assessment & Plan Note (Signed)
-  PFT's 11/08/2007 FEV1  2.27 (70%) ratio 70 and 16% better p B2 and DLCO 72%   Needs repeat pft's since now has scleroderma

## 2011-06-16 NOTE — Assessment & Plan Note (Signed)

## 2011-06-20 ENCOUNTER — Encounter: Payer: Self-pay | Admitting: Internal Medicine

## 2011-07-28 ENCOUNTER — Ambulatory Visit (INDEPENDENT_AMBULATORY_CARE_PROVIDER_SITE_OTHER): Payer: 59 | Admitting: Internal Medicine

## 2011-07-28 ENCOUNTER — Encounter: Payer: Self-pay | Admitting: Internal Medicine

## 2011-07-28 DIAGNOSIS — J449 Chronic obstructive pulmonary disease, unspecified: Secondary | ICD-10-CM

## 2011-07-28 LAB — PULMONARY FUNCTION TEST

## 2011-07-28 NOTE — Progress Notes (Signed)
PFT done today. 

## 2011-08-08 ENCOUNTER — Encounter: Payer: Self-pay | Admitting: Internal Medicine

## 2012-04-26 ENCOUNTER — Ambulatory Visit: Payer: Self-pay | Admitting: Physician Assistant

## 2012-04-26 VITALS — BP 120/80 | HR 80 | Temp 98.4°F | Resp 18 | Ht 64.5 in | Wt 124.0 lb

## 2012-04-26 DIAGNOSIS — M349 Systemic sclerosis, unspecified: Secondary | ICD-10-CM

## 2012-04-26 DIAGNOSIS — I2510 Atherosclerotic heart disease of native coronary artery without angina pectoris: Secondary | ICD-10-CM | POA: Insufficient documentation

## 2012-04-26 DIAGNOSIS — M109 Gout, unspecified: Secondary | ICD-10-CM | POA: Insufficient documentation

## 2012-04-26 DIAGNOSIS — N189 Chronic kidney disease, unspecified: Secondary | ICD-10-CM

## 2012-04-26 DIAGNOSIS — Z9861 Coronary angioplasty status: Secondary | ICD-10-CM | POA: Insufficient documentation

## 2012-04-26 DIAGNOSIS — Z0289 Encounter for other administrative examinations: Secondary | ICD-10-CM

## 2012-04-26 DIAGNOSIS — L80 Vitiligo: Secondary | ICD-10-CM

## 2012-04-26 NOTE — Patient Instructions (Signed)
We cannot give you a medical card until we can review some information from Dr. Dagmar Hait and your cardiologist.  We have left a message for Dr. Dagmar Hait, but they are currently closed for lunch.  Specifically, we need the results of a stress test from within the past 2 years, and you need to see your cardiologist every 1-2 years.
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
## 2012-04-29 ENCOUNTER — Telehealth: Payer: Self-pay

## 2012-04-29 NOTE — Telephone Encounter (Signed)
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 Best# [phone] (pts wife's number)

## 2012-04-29 NOTE — Telephone Encounter (Signed)
I have spoken to his wife to advise we do not have this, she is going to get this and bring to [place], it was done at [facility] cardiology.

## 2012-05-01 ENCOUNTER — Telehealth: Payer: Self-pay

## 2012-05-01 NOTE — Telephone Encounter (Signed)
I still have not gotten his stress test. I have left message for Almyra Free to see if this may have gotten to her.

## 2012-05-01 NOTE — Telephone Encounter (Signed)
Patient's wife [name] is returning Amy's call.  Best [phone]

## 2012-05-02 NOTE — Telephone Encounter (Signed)
Spoke to medical records, stress test results were faxed and should be in Chelle's box.

## 2012-05-02 NOTE — Telephone Encounter (Signed)
I spoke w/pt's wife and let her know that we received record of old stress test from 2010, but that a new one is needed bc DOT requires one to be less than 54 yrs old. Advised wife that all that is needed is a new exercise stress test, and if it is neg, pt will qualify for a 1 year DOT card. Wife verbalized understanding and stated she will get him an appt for a new stress test.

## 2012-05-07 ENCOUNTER — Other Ambulatory Visit (HOSPITAL_COMMUNITY): Payer: Self-pay | Admitting: Cardiovascular Disease

## 2012-05-07 DIAGNOSIS — I2510 Atherosclerotic heart disease of native coronary artery without angina pectoris: Secondary | ICD-10-CM

## 2012-05-14 ENCOUNTER — Ambulatory Visit (HOSPITAL_COMMUNITY)
Admission: RE | Admit: 2012-05-14 | Discharge: 2012-05-14 | Disposition: A | Discharge status: Home / Self Care | Payer: 59 | Source: Ambulatory Visit | Attending: Cardiovascular Disease | Admitting: Cardiovascular Disease

## 2012-05-14 DIAGNOSIS — I2510 Atherosclerotic heart disease of native coronary artery without angina pectoris: Secondary | ICD-10-CM | POA: Insufficient documentation

## 2012-05-14 DIAGNOSIS — I10 Essential (primary) hypertension: Secondary | ICD-10-CM | POA: Insufficient documentation

## 2012-05-14 DIAGNOSIS — F17200 Nicotine dependence, unspecified, uncomplicated: Secondary | ICD-10-CM | POA: Insufficient documentation

## 2012-05-14 HISTORY — DX: Essential (primary) hypertension: I10

## 2012-05-14 MED ORDER — TECHNETIUM TC 99M SESTAMIBI GENERIC - CARDIOLITE
10.6000 | Freq: Once | INTRAVENOUS | Status: AC | PRN
  Administered 2012-05-14: 11 via INTRAVENOUS

## 2012-05-14 MED ORDER — REGADENOSON 0.4 MG/5ML IV SOLN
0.4000 mg | Freq: Once | INTRAVENOUS | Status: AC
  Administered 2012-05-14: 0.4 mg via INTRAVENOUS

## 2012-05-14 MED ORDER — TECHNETIUM TC 99M SESTAMIBI GENERIC - CARDIOLITE
30.3000 | Freq: Once | INTRAVENOUS | Status: AC | PRN
  Administered 2012-05-14: 30.3 via INTRAVENOUS

## 2012-05-14 MED ORDER — AMINOPHYLLINE 25 MG/ML IV SOLN
75.0000 mg | Freq: Once | INTRAVENOUS | Status: AC
  Administered 2012-05-14: 75 mg via INTRAVENOUS

## 2012-05-14 NOTE — Procedures (Addendum)
[facility] [address] [place] [address] [phone]  Cardiology Nuclear Med Study  BASIM BARTNIK is a 52 y.o. male     MRN : [id number]     DOB: 07-May-1960  Procedure Date: 05/14/2012  Nuclear Med Background Indication for Stress Test:  Evaluation for Ischemia and DOT Physical History:  CAD, MI 2010 Cardiac Risk Factors: Hypertension, Lipids and Smoker  Symptoms:  none   Nuclear Pre-Procedure Caffeine/Decaff Intake:  7:00pm NPO After: 5:00am   IV Site: R Antecubital  IV 0.9% NS with Angio Cath:  22g  Chest Size (in):  40 IV Started by: Otho Perl, CNMT  Height: _0  (1.676 m)  Cup Size: n/a  BMI:  Body mass index is 19.69 kg/(m^2). Weight:  122 lb (55.339 kg)   Tech Comments:  n/a    Nuclear Med Study 1 or 2 day study: 1 day  Stress Test Type:  Corn Provider:  [name], MD   Resting Radionuclide: Technetium 7mSestamibi  Resting Radionuclide Dose: 10.6 mCi   Stress Radionuclide:  Technetium 927mestamibi  Stress Radionuclide Dose: 30.3 mCi           Stress Protocol Rest HR: 58 Stress HR:91  Rest BP: 125/83 Stress BP: 141/97  Exercise Time (min): n/a METS: n/a          Dose of Adenosine (mg):  n/a Dose of Lexiscan: 0.4 mg  Dose of Atropine (mg): n/a Dose of Dobutamine: n/a mcg/kg/min (at max HR)  Stress Test Technologist: GwMellody MemosCCT Nuclear Technologist: PaImagene RichesCNMT   Rest Procedure:  Myocardial perfusion imaging was performed at rest 45 minutes following the intravenous administration of Technetium 9964mstamibi. Stress Procedure:  The patient received IV Lexiscan 0.4 mg over 15-seconds.  Technetium 56m30mtamibi injected at 30-seconds.  Due to shortness of breath, patient was given IV Aminophylline 75 mg. Symptom was resolved.There were no significant changes with Lexiscan.  Quantitative spect images were obtained after a 45 minute  delay.  Transient Ischemic Dilatation (Normal <1.22):  1.1 Lung/Heart Ratio (Normal <0.45):  0.20 QGS EDV:  65 ml QGS ESV:  32 ml LV Ejection Fraction: 51%  Signed by PameLesleigh NoeilHardin NegusMT  PHYSICIAN INTERPRETATION:  Rest ECG: NSR - Normal EKG  Stress ECG: No significant change from baseline ECG  QPS Raw Data Images:  There is interference from nuclear activity from structures below the diaphragm. This does not affect the ability to read the study. Stress Images:  There is decreased uptake in the apex. Suggestive of apical thinning vs artifact from acquisition error -- there is a linear cut across the apex in several stress images not seen in resting images or the images proximal or distal. Rest Images:  Comparison with the stress images reveals no significant change. Subtraction (SDS):  There is no evidence of scar or ischemia.  Impression Exercise Capacity:  Lexiscan with no exercise. BP Response:  Normal blood pressure response. Clinical Symptoms:  There is dyspnea. ECG Impression:  No significant ST segment change suggestive of ischemia. LV Wall Motion:  NL LV Function; NL Wall Motion  Comparison with Prior Nuclear Study: Inferior perfusion defect read as diaphragmatic attenuation is no longer present.  Overall Impression:  Low risk stress nuclear study.   HARDLeonie Man  05/14/2012 1:34 PM

## 2012-05-16 ENCOUNTER — Telehealth: Payer: Self-pay

## 2012-05-16 NOTE — Telephone Encounter (Signed)
CALLING FOR BARBARA ABOUT PPW FOR STRESS TEST.  [phone]

## 2012-05-17 ENCOUNTER — Telehealth: Payer: Self-pay | Admitting: Physician Assistant

## 2012-05-17 NOTE — Telephone Encounter (Signed)
Pt returned with report from cardiology on myocardial perfusion scan performed 05/14/12.  Impression states low risk and OK for DOT.  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  Card signed for 1 yr from date of initial exam, 04/26/12.

## 2012-05-17 NOTE — Telephone Encounter (Signed)
Benjamine Mola PA has the forms for DOT she is filling these out.

## 2012-05-23 ENCOUNTER — Encounter: Payer: Self-pay | Admitting: Physician Assistant

## 2012-06-26 ENCOUNTER — Encounter: Payer: Self-pay | Admitting: *Deleted

## 2012-07-22 ENCOUNTER — Encounter: Payer: Self-pay | Admitting: Cardiovascular Disease

## 2012-10-18 ENCOUNTER — Encounter: Payer: Self-pay | Admitting: Cardiovascular Disease

## 2012-10-18 ENCOUNTER — Ambulatory Visit (INDEPENDENT_AMBULATORY_CARE_PROVIDER_SITE_OTHER): Payer: 59 | Admitting: Cardiovascular Disease

## 2012-10-18 VITALS — BP 128/82 | HR 74 | Ht 68.0 in | Wt 117.7 lb

## 2012-10-18 DIAGNOSIS — Z79899 Other long term (current) drug therapy: Secondary | ICD-10-CM

## 2012-10-18 DIAGNOSIS — E785 Hyperlipidemia, unspecified: Secondary | ICD-10-CM | POA: Insufficient documentation

## 2012-10-18 DIAGNOSIS — I2510 Atherosclerotic heart disease of native coronary artery without angina pectoris: Secondary | ICD-10-CM

## 2012-10-18 MED ORDER — SIMVASTATIN 40 MG PO TABS: 40.0000 mg | ORAL_TABLET | Freq: Every evening | ORAL | Status: DC

## 2012-10-18 NOTE — Assessment & Plan Note (Signed)
He was on simvastatin in the past but has not been taking this. His last cholesterol performed by Dr. Dagmar Hait was in the 170 range with a particle count of 1500. I've discussed with him restarting his statin drug and rechecking a lipid profile.

## 2012-10-18 NOTE — Patient Instructions (Addendum)
  We will see you back in follow up in 12 months  Dr [name] has ordered bloodwork to be done in 3 months after restaring your cholesterol medication.

## 2012-10-18 NOTE — Progress Notes (Signed)
10/18/2012 [name]   Sep 24, 1960  [id number]  Primary Physician Tivis Ringer, MD Primary Cardiologist: Lorretta Harp MD Renae Gloss   HPI:  The patient is a 52 year old, thin-appearing, African American male, father of 78, grandfather to 2 grandchildren who I last saw 9 months ago. He has a history of idiopathic pericardial effusion as well as scleroderma, hypertension, hyperlipidemia with low HDL. I catheterized him December 24, 2008, revealing a tight distal RCA stenosis which I ultimately stented with a bare-metal stent on January 07, 2009. He continues to smoke a pack a day. Dr. Merilynn Finland performed a subxiphoid window removing 200 mL of pericardial fluid which resulted in clinical improvement at that time. Followup echo performed November 2011 showed no effusion with normal EF. His other problems include hypertension and hyperlipidemia. He is otherwise asymptomatic and denied any chest pain or shortness of breath. Dr. Dagmar Hait follows his lipid profile which most recently was performed revealing total cholesterol of 170 with a particle count of 1500. The patient admits to not taking his simvastatin. He did have recent Myoview stress test performed in January of this year which was nonischemic which was performed because of department of transportation requirements. He does continue to smoke.      Current Outpatient Prescriptions  Medication Sig Dispense Refill  . allopurinol (ZYLOPRIM) 100 MG tablet Take 100 mg by mouth daily.      [name] ALPRAZolam (XANAX) 0.5 MG tablet Take 0.5 mg by mouth every 6 (six) hours as needed.      [name] aspirin 81 MG chewable tablet Chew 81 mg by mouth daily.      . chlorhexidine (PERIDEX) 0.12 % solution Use as directed 1 mL in the mouth or throat 2 (two) times daily.      . clopidogrel (PLAVIX) 75 MG tablet Take 75 mg by mouth daily.      . colchicine 0.6 MG tablet Take 0.6 mg by mouth daily as needed.      [name] guaifenesin (HUMIBID E)  400 MG TABS Take 400 mg by mouth as needed.      . hydrochlorothiazide (HYDRODIURIL) 25 MG tablet Take 12.5 mg by mouth daily.      [name] HYDROcodone-acetaminophen (VICODIN) 5-500 MG per tablet Take 1 tablet by mouth every 6 (six) hours as needed.      . isosorbide mononitrate (IMDUR) 30 MG 24 hr tablet Take 30 mg by mouth daily.      . Nebivolol HCl (BYSTOLIC) 20 MG TABS Take 1 tablet by mouth daily.      . nitroGLYCERIN (NITROSTAT) 0.4 MG SL tablet Place 0.4 mg under the tongue every 5 (five) minutes as needed.      . pantoprazole (PROTONIX) 40 MG tablet Take 40 mg by mouth daily.      . ramipril (ALTACE) 5 MG capsule Take 5 mg by mouth daily.       No current facility-administered medications for this visit.    No Known Allergies  History   Social History  . Marital Status: Married    Spouse Name: [name]    Number of Children: 6  . 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 Smoking status: Current Every Day Smoker -- 1.00 packs/day for 38 years    Types: Cigarettes    Start date: 10/21/1971  . Smokeless tobacco: Never Used  . Alcohol Use: 2 - 2.5 oz/week  4-5 drink(s) per week  . Drug Use: No  . Sexually Active: Yes -- Male partner(s)   Other Topics Concern  . Not on file   Social History Narrative   Lives with his wife and one daughter.     Review of Systems: General: negative for chills, fever, night sweats or weight changes.  Cardiovascular: negative for chest pain, dyspnea on exertion, edema, orthopnea, palpitations, paroxysmal nocturnal dyspnea or shortness of breath Dermatological: negative for rash Respiratory: negative for cough or wheezing Urologic: negative for hematuria Abdominal: negative for nausea, vomiting, diarrhea, bright red blood per rectum, melena, or hematemesis Neurologic: negative for visual changes, syncope, or dizziness All other systems reviewed and are otherwise negative  except as noted above.    Blood pressure 128/82, pulse 74, height _0  (1.727 m), weight 117 lb 11.2 oz (53.388 kg).  General appearance: alert and no distress Neck: no adenopathy, no carotid bruit, no JVD, supple, symmetrical, trachea midline and thyroid not enlarged, symmetric, no tenderness/mass/nodules Lungs: clear to auscultation bilaterally Heart: regular rate and rhythm, S1, S2 normal, no murmur, click, rub or gallop Extremities: extremities normal, atraumatic, no cyanosis or edema  EKG normal sinus rhythm at 74 with inferolateral T-wave inversion. He has had this from prior EKGs as well.  ASSESSMENT AND PLAN:   CAD (coronary artery disease) Status post distal RCA intervention with bare-metal stent 01/07/2009. His last stress test 04/24/12 was nonischemic. He denies chest pain or shortness of breath  Hyperlipidemia He was on simvastatin in the past but has not been taking this. His last cholesterol performed by Dr. Dagmar Hait was in the 170 range with a particle count of 1500. I've discussed with him restarting his statin drug and rechecking a lipid profile.      Lorretta Harp MD FACP,FACC,FAHA, [facility] 10/18/2012 10:05 AM

## 2012-10-18 NOTE — Assessment & Plan Note (Signed)
Status post distal RCA intervention with bare-metal stent 01/07/2009. His last stress test 04/24/12 was nonischemic. He denies chest pain or shortness of breath

## 2012-11-25 ENCOUNTER — Other Ambulatory Visit: Payer: Self-pay | Admitting: *Deleted

## 2012-11-25 MED ORDER — CLOPIDOGREL BISULFATE 75 MG PO TABS: 75.0000 mg | ORAL_TABLET | Freq: Every day | ORAL | Status: DC

## 2012-11-25 NOTE — Telephone Encounter (Signed)
Rx was sent to pharmacy electronically.

## 2013-03-14 ENCOUNTER — Other Ambulatory Visit: Payer: Self-pay | Admitting: Cardiovascular Disease

## 2013-03-14 NOTE — Telephone Encounter (Signed)
Rx was sent to pharmacy electronically. 

## 2013-04-09 ENCOUNTER — Other Ambulatory Visit: Payer: Self-pay | Admitting: Cardiovascular Disease

## 2013-04-09 NOTE — Telephone Encounter (Signed)
Rx was sent to pharmacy electronically.

## 2013-05-25 ENCOUNTER — Other Ambulatory Visit: Payer: Self-pay | Admitting: Cardiovascular Disease

## 2013-05-26 NOTE — Telephone Encounter (Signed)
Rx was sent to pharmacy electronically. 

## 2014-01-16 ENCOUNTER — Other Ambulatory Visit: Payer: Self-pay | Admitting: Cardiovascular Disease

## 2014-01-16 NOTE — Telephone Encounter (Signed)
Rx was sent to pharmacy electronically. Patient needs office visit for future refills. Last office visit 10/18/2012 with JB. First warning given.

## 2014-02-07 ENCOUNTER — Other Ambulatory Visit: Payer: Self-pay | Admitting: Cardiovascular Disease

## 2014-02-09 NOTE — Telephone Encounter (Signed)
Rx was sent to pharmacy electronically.

## 2014-02-12 ENCOUNTER — Other Ambulatory Visit: Payer: Self-pay | Admitting: Cardiovascular Disease

## 2014-03-06 ENCOUNTER — Other Ambulatory Visit: Payer: Self-pay | Admitting: Cardiovascular Disease

## 2014-03-06 NOTE — Telephone Encounter (Signed)
Rx was sent to pharmacy electronically. OV 05/01/2014

## 2014-03-10 ENCOUNTER — Other Ambulatory Visit: Payer: Self-pay | Admitting: Cardiovascular Disease

## 2014-03-10 NOTE — Telephone Encounter (Signed)
Rx was sent to pharmacy electronically.

## 2014-04-07 ENCOUNTER — Other Ambulatory Visit: Payer: Self-pay | Admitting: Cardiovascular Disease

## 2014-04-14 ENCOUNTER — Other Ambulatory Visit: Payer: Self-pay | Admitting: Cardiovascular Disease

## 2014-04-15 NOTE — Telephone Encounter (Signed)
Rx(s) sent to pharmacy electronically.  

## 2014-05-01 ENCOUNTER — Encounter: Payer: Self-pay | Admitting: Cardiovascular Disease

## 2014-05-01 ENCOUNTER — Ambulatory Visit (INDEPENDENT_AMBULATORY_CARE_PROVIDER_SITE_OTHER): Payer: 59 | Admitting: Cardiovascular Disease

## 2014-05-01 VITALS — BP 102/70 | HR 78 | Ht 67.75 in | Wt 120.5 lb

## 2014-05-01 DIAGNOSIS — I2583 Coronary atherosclerosis due to lipid rich plaque: Secondary | ICD-10-CM

## 2014-05-01 DIAGNOSIS — I3139 Other pericardial effusion (noninflammatory): Secondary | ICD-10-CM | POA: Insufficient documentation

## 2014-05-01 DIAGNOSIS — I2510 Atherosclerotic heart disease of native coronary artery without angina pectoris: Secondary | ICD-10-CM

## 2014-05-01 DIAGNOSIS — I313 Pericardial effusion (noninflammatory): Secondary | ICD-10-CM

## 2014-05-01 DIAGNOSIS — F17200 Nicotine dependence, unspecified, uncomplicated: Secondary | ICD-10-CM

## 2014-05-01 DIAGNOSIS — Z720 Tobacco use: Secondary | ICD-10-CM

## 2014-05-01 DIAGNOSIS — E785 Hyperlipidemia, unspecified: Secondary | ICD-10-CM

## 2014-05-01 DIAGNOSIS — I319 Disease of pericardium, unspecified: Secondary | ICD-10-CM

## 2014-05-01 NOTE — Assessment & Plan Note (Signed)
Patient continues to smoke and is recalcitrant to risk factor modification

## 2014-05-01 NOTE — Assessment & Plan Note (Signed)
History of hyperlipidemia on simvastatin 40 mg a day. This is followed by his PCP

## 2014-05-01 NOTE — Assessment & Plan Note (Signed)
He had recurrent pericardial effusion thought most likely secondary to his scleroderma. He had a subxiphoid window placed by Dr. [name]. His last follow-up echo performed in 2011 showed no recurrent effusion.

## 2014-05-01 NOTE — Assessment & Plan Note (Signed)
History of coronary artery disease status post bare metal stent implantation into his distal dominant RCA 01/07/09 by myself. He has been asymptomatic since. His last Myoview performed 05/14/12 was nonischemic.

## 2014-05-01 NOTE — Patient Instructions (Signed)
Your physician wants you to follow-up in: 1 year with Dr Berry. You will receive a reminder letter in the mail two months in advance. If you don't receive a letter, please call our office to schedule the follow-up appointment.  

## 2014-05-01 NOTE — Progress Notes (Signed)
05/01/2014 [name]   November 13, 1960  [id number]  Primary Physician Tivis Ringer, MD Primary Cardiologist: Lorretta Harp MD Renae Gloss   HPI:  The patient is a 54 year old, thin-appearing, African American male, father of 88, grandfather to 2 grandchildren who I last saw 18 months ago. He has a history of idiopathic pericardial effusion as well as scleroderma, hypertension, hyperlipidemia with low HDL. I catheterized him December 24, 2008, revealing a tight distal RCA stenosis which I ultimately stented with a bare-metal stent on January 07, 2009. He continues to smoke a pack a day. Dr. Merilynn Finland performed a subxiphoid window removing 200 mL of pericardial fluid which resulted in clinical improvement at that time. Followup echo performed November 2011 showed no effusion with normal EF. His other problems include hypertension and hyperlipidemia. He is otherwise asymptomatic and denied any chest pain or shortness of breath. Dr. Dagmar Hait follows his lipid profile which most recently was performed revealing total cholesterol of 170 with a particle count of 1500. The patient admits to not taking his simvastatin.his last Myoview stress test performed 05/14/12 was nonischemic. He denies chest pain or shortness of breath.  Current Outpatient Prescriptions  Medication Sig Dispense Refill  . allopurinol (ZYLOPRIM) 100 MG tablet Take 100 mg by mouth daily.    [name] ALPRAZolam (XANAX) 0.5 MG tablet Take 0.5 mg by mouth every 6 (six) hours as needed.    [name] aspirin 81 MG chewable tablet Chew 81 mg by mouth daily.    . clopidogrel (PLAVIX) 75 MG tablet TAKE 1 TABLET (75 MG TOTAL) BY MOUTH DAILY. <PLEASE SCHEDULE APPOINTMENT FOR REFILLS> 30 tablet 0  . colchicine 0.6 MG tablet Take 0.6 mg by mouth daily as needed.    . hydrochlorothiazide (HYDRODIURIL) 25 MG tablet Take 0.5 tablets (12.5 mg total) by mouth daily. 15 tablet 1  . HYDROcodone-acetaminophen (VICODIN) 5-500 MG per tablet  Take 1 tablet by mouth every 6 (six) hours as needed.    . isosorbide mononitrate (IMDUR) 30 MG 24 hr tablet Take 30 mg by mouth daily.    . isosorbide mononitrate (IMDUR) 60 MG 24 hr tablet Take 0.5 tablets (30 mg total) by mouth daily. <Appointment 05/01/2014> 15 tablet 0  . Nebivolol HCl (BYSTOLIC) 20 MG TABS Take 1 tablet by mouth daily.    . nitroGLYCERIN (NITROSTAT) 0.4 MG SL tablet Place 0.4 mg under the tongue every 5 (five) minutes as needed.    . pantoprazole (PROTONIX) 40 MG tablet Take 1 tablet (40 mg total) by mouth daily. <please schedule appointment for refills> 30 tablet 0  . ramipril (ALTACE) 5 MG capsule Take 5 mg by mouth daily.    . simvastatin (ZOCOR) 40 MG tablet Take 1 tablet (40 mg total) by mouth daily. <office visit 05/01/2014> 30 tablet 1  . fluticasone (FLONASE) 50 MCG/ACT nasal spray Place 2 sprays into both nostrils daily as needed.  11   No current facility-administered medications for this visit.    No Known Allergies  History   Social History  . Marital Status: Married    Spouse Name: [name]    Number of Children: 6  . 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 Smoking status: Current Every Day Smoker -- 1.00 packs/day for 38 years    Types: Cigarettes    Start date: 10/21/1971  . Smokeless tobacco: Never Used  . Alcohol Use: 2.0 - 2.5 oz/week  4-5 drink(s) per week  . Drug Use: No  . Sexual Activity:    Partners: Female   Other Topics Concern  . Not on file   Social History Narrative   Lives with his wife and one daughter.     Review of Systems: General: negative for chills, fever, night sweats or weight changes.  Cardiovascular: negative for chest pain, dyspnea on exertion, edema, orthopnea, palpitations, paroxysmal nocturnal dyspnea or shortness of breath Dermatological: negative for rash Respiratory: negative for cough or wheezing Urologic: negative for  hematuria Abdominal: negative for nausea, vomiting, diarrhea, bright red blood per rectum, melena, or hematemesis Neurologic: negative for visual changes, syncope, or dizziness All other systems reviewed and are otherwise negative except as noted above.    Blood pressure 102/70, pulse 78, height 5' 7.75" (1.721 m), weight 120 lb 8 oz (54.658 kg).  General appearance: alert and no distress Neck: no adenopathy, no carotid bruit, no JVD, supple, symmetrical, trachea midline and thyroid not enlarged, symmetric, no tenderness/mass/nodules Lungs: clear to auscultation bilaterally Heart: regular rate and rhythm, S1, S2 normal, no murmur, click, rub or gallop Extremities: extremities normal, atraumatic, no cyanosis or edema  EKG normal sinus rhythm at 78 without ST or T-wave changes. I personally reviewed this EKG  ASSESSMENT AND PLAN:   Smoker Patient continues to smoke and is recalcitrant to risk factor modification  Hyperlipidemia History of hyperlipidemia on simvastatin 40 mg a day. This is followed by his PCP  CAD (coronary artery disease) History of coronary artery disease status post bare metal stent implantation into his distal dominant RCA 01/07/09 by myself. He has been asymptomatic since. His last Myoview performed 05/14/12 was nonischemic.  Pericardial effusion He had recurrent pericardial effusion thought most likely secondary to his scleroderma. He had a subxiphoid window placed by Dr. [name]. His last follow-up echo performed in 2011 showed no recurrent effusion.      Lorretta Harp MD FACP,FACC,FAHA, [facility] 05/01/2014 10:19 AM

## 2014-05-05 ENCOUNTER — Other Ambulatory Visit: Payer: Self-pay | Admitting: Cardiovascular Disease

## 2014-05-06 NOTE — Telephone Encounter (Signed)
Rx has been sent to the pharmacy electronically.

## 2014-05-10 ENCOUNTER — Other Ambulatory Visit: Payer: Self-pay | Admitting: Cardiovascular Disease

## 2014-06-06 ENCOUNTER — Other Ambulatory Visit: Payer: Self-pay | Admitting: Cardiovascular Disease

## 2014-06-08 NOTE — Telephone Encounter (Signed)
Rx(s) sent to pharmacy electronically.  

## 2014-09-12 ENCOUNTER — Other Ambulatory Visit: Payer: Self-pay | Admitting: Cardiovascular Disease

## 2014-09-14 NOTE — Telephone Encounter (Signed)
Rx(s) sent to pharmacy electronically.  

## 2014-09-17 ENCOUNTER — Other Ambulatory Visit: Payer: Self-pay | Admitting: Cardiovascular Disease

## 2014-09-19 ENCOUNTER — Other Ambulatory Visit: Payer: Self-pay | Admitting: Cardiovascular Disease

## 2014-09-22 NOTE — Telephone Encounter (Signed)
Rx(s) sent to pharmacy electronically.  

## 2015-04-21 ENCOUNTER — Telehealth: Payer: Self-pay | Admitting: Cardiovascular Disease

## 2015-04-21 NOTE — Telephone Encounter (Signed)
Received records from [facility] for appointment on 05/14/15 with Dr [name].  Records given to [facility] (medical records) for Dr Kennon Holter schedule on 05/14/15. lp

## 2015-05-14 ENCOUNTER — Ambulatory Visit (INDEPENDENT_AMBULATORY_CARE_PROVIDER_SITE_OTHER): Payer: 59 | Admitting: Cardiovascular Disease

## 2015-05-14 ENCOUNTER — Encounter: Payer: Self-pay | Admitting: Cardiovascular Disease

## 2015-05-14 VITALS — BP 130/86 | HR 65 | Ht 69.0 in | Wt 130.2 lb

## 2015-05-14 DIAGNOSIS — E785 Hyperlipidemia, unspecified: Secondary | ICD-10-CM

## 2015-05-14 DIAGNOSIS — I2510 Atherosclerotic heart disease of native coronary artery without angina pectoris: Secondary | ICD-10-CM

## 2015-05-14 DIAGNOSIS — Z720 Tobacco use: Secondary | ICD-10-CM | POA: Diagnosis not present

## 2015-05-14 DIAGNOSIS — I319 Disease of pericardium, unspecified: Secondary | ICD-10-CM | POA: Diagnosis not present

## 2015-05-14 DIAGNOSIS — I3139 Other pericardial effusion (noninflammatory): Secondary | ICD-10-CM

## 2015-05-14 DIAGNOSIS — F17200 Nicotine dependence, unspecified, uncomplicated: Secondary | ICD-10-CM

## 2015-05-14 DIAGNOSIS — I2583 Coronary atherosclerosis due to lipid rich plaque: Principal | ICD-10-CM

## 2015-05-14 DIAGNOSIS — I313 Pericardial effusion (noninflammatory): Secondary | ICD-10-CM

## 2015-05-14 MED ORDER — NITROGLYCERIN 0.4 MG SL SUBL: 0.4000 mg | SUBLINGUAL_TABLET | SUBLINGUAL | Status: DC | PRN

## 2015-05-14 NOTE — Assessment & Plan Note (Signed)
History of hyperlipidemia on statin therapy followed by his PCP

## 2015-05-14 NOTE — Progress Notes (Signed)
05/14/2015 DAYLN TUGWELL   1961/02/09  [id number]  Primary Physician Tivis Ringer, MD Primary Cardiologist: Lorretta Harp MD Renae Gloss   HPI:  The patient is a 55 year old, thin-appearing, African American male, father of 11, grandfather to 2 grandchildren who I last saw 05/01/14. 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 I catheterized him December 24, 2008, revealing a tight distal RCA stenosis which I ultimately stented with a bare-metal stent on January 07, 2009. He continues to smoke a pack a day. Dr. Merilynn Finland performed a subxiphoid window removing 200 mL of pericardial fluid which resulted in clinical improvement at that time. Followup echo performed November 2011 showed no effusion with normal EF. His other problems include hypertension and hyperlipidemia. He is otherwise asymptomatic and denied any chest pain or shortness of breath.  The patient admits to not taking his simvastatin. His last Myoview stress test performed 05/14/12 was nonischemic. He denies chest pain or shortness of breath.   Current Outpatient Prescriptions  Medication Sig Dispense Refill  . allopurinol (ZYLOPRIM) 100 MG tablet Take 100 mg by mouth daily.    [name] ALPRAZolam (XANAX) 0.5 MG tablet Take 0.5 mg by mouth every 6 (six) hours as needed.    [name] aspirin 81 MG chewable tablet Chew 81 mg by mouth daily. Pt taking once a week    . clopidogrel (PLAVIX) 75 MG tablet Take 1 tablet (75 mg total) by mouth daily. 30 tablet 11  . colchicine 0.6 MG tablet Take 0.6 mg by mouth daily as needed.    . fluticasone (FLONASE) 50 MCG/ACT nasal spray Place 2 sprays into both nostrils daily as needed.  11  . hydrochlorothiazide (HYDRODIURIL) 25 MG tablet TAKE 0.5 TABLETS (12.5 MG TOTAL) BY MOUTH DAILY. 15 tablet 11  . HYDROcodone-acetaminophen (VICODIN) 5-500 MG per tablet Take 1 tablet by  mouth every 6 (six) hours as needed.    . isosorbide mononitrate (IMDUR) 60 MG 24 hr tablet Take 0.5 tablets (30 mg total) by mouth daily. 15 tablet 7  . Nebivolol HCl (BYSTOLIC) 20 MG TABS Take 1 tablet by mouth daily.    . nitroGLYCERIN (NITROSTAT) 0.4 MG SL tablet Place 0.4 mg under the tongue every 5 (five) minutes as needed.    . pantoprazole (PROTONIX) 40 MG tablet TAKE 1 TABLET (40 MG TOTAL) BY MOUTH DAILY. <PLEASE SCHEDULE APPOINTMENT FOR REFILLS> 30 tablet 6  . ramipril (ALTACE) 5 MG capsule Take 5 mg by mouth daily.    . simvastatin (ZOCOR) 40 MG tablet Take 1 tablet (40 mg total) by mouth daily at 6 PM. 30 tablet 11   No current facility-administered medications for this visit.    No Known Allergies  Social History   Social History  . Marital Status: Married    Spouse Name: [name]  . Number of Children: 6  . 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 Smoking status: Current Every Day Smoker -- 1.00 packs/day for 38 years    Types: Cigarettes    Start date: 10/21/1971  . Smokeless tobacco: Never Used  . Alcohol Use: 2.0 - 2.5 oz/week    4-5 drink(s) per week  . Drug Use: No  . Sexual Activity:    Partners: Female   Other Topics Concern  . Not on file  Social History Narrative   Lives with his wife and one daughter.     Review of Systems: General: negative for chills, fever, night sweats or weight changes.  Cardiovascular: negative for chest pain, dyspnea on exertion, edema, orthopnea, palpitations, paroxysmal nocturnal dyspnea or shortness of breath Dermatological: negative for rash Respiratory: negative for cough or wheezing Urologic: negative for hematuria Abdominal: negative for nausea, vomiting, diarrhea, bright red blood per rectum, melena, or hematemesis Neurologic: negative for visual changes, syncope, or dizziness All other systems reviewed and are otherwise negative except as noted  above.    Blood pressure 130/86, pulse 65, height _0  (1.753 m), weight 130 lb 3.2 oz (59.058 kg).  General appearance: alert and no distress Neck: no adenopathy, no carotid bruit, no JVD, supple, symmetrical, trachea midline and thyroid not enlarged, symmetric, no tenderness/mass/nodules Lungs: clear to auscultation bilaterally Heart: regular rate and rhythm, S1, S2 normal, no murmur, click, rub or gallop Extremities: extremities normal, atraumatic, no cyanosis or edema  EKG normal sinus rhythm at 65 with biatrial enlargement and nonspecific ST and T-wave changes. I personally reviewed this EKG  ASSESSMENT AND PLAN:   CAD (coronary artery disease) History of CAD status post catheterization which I performed 12/24/08 revealing tight distal RCA stenosis which ultimately stented with a bare metal stent on 01/07/09. He is on aspirin and Plavix and does complain of nosebleeds. He denies chest pain or shortness of breath. I think it safe at this point. His Plavix and continue low-dose aspirin on a daily basis.  Smoker History of continued tobacco abuse recalcitrant risk factor modification  Hyperlipidemia History of hyperlipidemia on statin therapy followed by his PCP  Pericardial effusion History of pericardial tap and denied status post subxiphoid window performed by Dr. [name] resulted in clinical improvement.      Lorretta Harp MD FACP,FACC,FAHA, [facility] 05/14/2015 11:14 AM

## 2015-05-14 NOTE — Patient Instructions (Signed)
Medication Instructions:  Your physician has recommended you make the following change in your medication:  1) STOP Plavix 2) Make sure you take you Aspirin 81 mg by mouth DAILY   Labwork: none  Testing/Procedures: none  Follow-Up: Your physician wants you to follow-up in: 12 months with Dr. [name]. You will receive a reminder letter in the mail two months in advance. If you don't receive a letter, please call our office to schedule the follow-up appointment.   Any Other Special Instructions Will Be Listed Below (If Applicable).     If you need a refill on your cardiac medications before your next appointment, please call your pharmacy.

## 2015-05-14 NOTE — Assessment & Plan Note (Signed)
History of continued tobacco abuse recalcitrant risk factor modification. 

## 2015-05-14 NOTE — Assessment & Plan Note (Signed)
History of CAD status post catheterization which I performed 12/24/08 revealing tight distal RCA stenosis which ultimately stented with a bare metal stent on 01/07/09. He is on aspirin and Plavix and does complain of nosebleeds. He denies chest pain or shortness of breath. I think it safe at this point. His Plavix and continue low-dose aspirin on a daily basis.

## 2015-05-14 NOTE — Assessment & Plan Note (Signed)
History of pericardial tap and denied status post subxiphoid window performed by Dr. [name] resulted in clinical improvement.

## 2015-05-14 NOTE — Addendum Note (Signed)
Addended by: Newt Minion on: 05/14/2015 12:04 PM   Modules accepted: Orders

## 2015-05-27 ENCOUNTER — Other Ambulatory Visit: Payer: Self-pay | Admitting: Cardiovascular Disease

## 2015-05-27 NOTE — Telephone Encounter (Signed)
Rx request sent to pharmacy.

## 2015-10-22 ENCOUNTER — Other Ambulatory Visit: Payer: Self-pay | Admitting: *Deleted

## 2015-10-22 MED ORDER — ISOSORBIDE MONONITRATE ER 60 MG PO TB24: 30.0000 mg | ORAL_TABLET | Freq: Every day | ORAL | Status: DC

## 2015-10-22 NOTE — Telephone Encounter (Signed)
Isosorbide 60 mg Rx has been sent to the pharmacy electronically.

## 2015-10-25 ENCOUNTER — Other Ambulatory Visit: Payer: Self-pay | Admitting: Cardiovascular Disease

## 2015-10-27 ENCOUNTER — Other Ambulatory Visit: Payer: Self-pay

## 2015-10-27 MED ORDER — SIMVASTATIN 40 MG PO TABS: 40.0000 mg | ORAL_TABLET | Freq: Every day | ORAL | Status: DC

## 2015-10-27 MED ORDER — HYDROCHLOROTHIAZIDE 25 MG PO TABS: ORAL_TABLET | ORAL | Status: DC

## 2016-02-11 ENCOUNTER — Ambulatory Visit (INDEPENDENT_AMBULATORY_CARE_PROVIDER_SITE_OTHER): Payer: 59 | Admitting: Rheumatology

## 2016-02-11 ENCOUNTER — Ambulatory Visit: Payer: Self-pay | Admitting: Rheumatology

## 2016-02-11 DIAGNOSIS — M1A00X0 Idiopathic chronic gout, unspecified site, without tophus (tophi): Secondary | ICD-10-CM | POA: Diagnosis not present

## 2016-02-11 DIAGNOSIS — M349 Systemic sclerosis, unspecified: Secondary | ICD-10-CM | POA: Diagnosis not present

## 2016-02-11 DIAGNOSIS — N189 Chronic kidney disease, unspecified: Secondary | ICD-10-CM

## 2016-02-11 DIAGNOSIS — F17200 Nicotine dependence, unspecified, uncomplicated: Secondary | ICD-10-CM

## 2016-02-11 DIAGNOSIS — R6889 Other general symptoms and signs: Secondary | ICD-10-CM | POA: Diagnosis not present

## 2016-02-11 DIAGNOSIS — D638 Anemia in other chronic diseases classified elsewhere: Secondary | ICD-10-CM

## 2016-02-14 ENCOUNTER — Telehealth: Payer: Self-pay | Admitting: Radiology

## 2016-02-14 DIAGNOSIS — E559 Vitamin D deficiency, unspecified: Secondary | ICD-10-CM

## 2016-02-14 NOTE — Telephone Encounter (Signed)
Patient has low Vit D 14/

## 2016-02-15 MED ORDER — VITAMIN D3 1.25 MG (50000 UT) PO CAPS: ORAL_CAPSULE | ORAL | 0 refills | Status: DC

## 2016-02-15 NOTE — Telephone Encounter (Signed)
[name] has reviewed Vit D and he also has a low kidney function. I have asked his wife to make sure he does follow up with PCP, as he has decreased kidney function and blood in his urine  Vit D very low, but [name] has advised Vit D 50000 units only once a week to preserve kidney function, will repeat Vit D in 3 months.   Have advised patient and sent in Vit D

## 2016-02-24 ENCOUNTER — Encounter: Payer: Self-pay | Admitting: Cardiology

## 2016-02-24 ENCOUNTER — Ambulatory Visit (INDEPENDENT_AMBULATORY_CARE_PROVIDER_SITE_OTHER): Payer: Commercial Managed Care - HMO | Admitting: Cardiology

## 2016-02-24 ENCOUNTER — Other Ambulatory Visit: Payer: Self-pay

## 2016-02-24 ENCOUNTER — Ambulatory Visit (HOSPITAL_COMMUNITY): Payer: Commercial Managed Care - HMO | Attending: Cardiology

## 2016-02-24 DIAGNOSIS — Z9861 Coronary angioplasty status: Secondary | ICD-10-CM

## 2016-02-24 DIAGNOSIS — I313 Pericardial effusion (noninflammatory): Secondary | ICD-10-CM

## 2016-02-24 DIAGNOSIS — I5031 Acute diastolic (congestive) heart failure: Secondary | ICD-10-CM | POA: Diagnosis present

## 2016-02-24 DIAGNOSIS — I10 Essential (primary) hypertension: Secondary | ICD-10-CM

## 2016-02-24 DIAGNOSIS — N183 Chronic kidney disease, stage 3 unspecified: Secondary | ICD-10-CM

## 2016-02-24 DIAGNOSIS — M349 Systemic sclerosis, unspecified: Secondary | ICD-10-CM

## 2016-02-24 DIAGNOSIS — I509 Heart failure, unspecified: Secondary | ICD-10-CM | POA: Insufficient documentation

## 2016-02-24 DIAGNOSIS — I3139 Other pericardial effusion (noninflammatory): Secondary | ICD-10-CM

## 2016-02-24 DIAGNOSIS — I2510 Atherosclerotic heart disease of native coronary artery without angina pectoris: Secondary | ICD-10-CM

## 2016-02-24 LAB — ECHOCARDIOGRAM COMPLETE
Height: 69 in
Weight: 2060.8 oz

## 2016-02-24 MED ORDER — NEBIVOLOL HCL 10 MG PO TABS: 10.0000 mg | ORAL_TABLET | Freq: Every day | ORAL | 5 refills | Status: DC

## 2016-02-24 NOTE — Progress Notes (Signed)
02/24/2016 [name]   02-15-1961  [id number]  Primary Physician Tivis Ringer, MD Primary Cardiologist: Dr [name]  HPI:  55 y/o AA male seen by [place] in 2010. He had an echo and Myoview. Echo showed a pericardial effusion and his Myoview was abnormal. He underwent pericardial window followed by staged RCA PCI with a BMS. His last echo was 2014- normal LVF, small pericardial effusion, grade 1 DD. Myoview 2014 was low risk. He has subsequently been diagnosed with Scleroderma and is followed by Dr Estanislado Pandy. The pt was referred to his PCP- DrAvva- for hematuria (though the pt denies he has blood in his urine). At that visit he com[plained of a cough and a CXR was done for suspected bronchitis. His CXR was read as "CHF" and he is referred here for further evaluation. The pt denies orthopnea, chest pain, or unusual DOE. He has mild LE edema.   Current Outpatient Prescriptions  Medication Sig Dispense Refill  . allopurinol (ZYLOPRIM) 100 MG tablet Take 100 mg by mouth daily.    [name] ALPRAZolam (XANAX) 0.5 MG tablet Take 0.5 mg by mouth every 6 (six) hours as needed.    [name] aspirin 81 MG chewable tablet Chew 81 mg by mouth daily. Pt taking once a week    . Cholecalciferol (VITAMIN D3) 50000 units CAPS Once weekly x12 weeks 12 capsule 0  . colchicine 0.6 MG tablet Take 0.6 mg by mouth daily as needed.    . fluticasone (FLONASE) 50 MCG/ACT nasal spray Place 2 sprays into both nostrils daily as needed.  11  . hydrochlorothiazide (HYDRODIURIL) 25 MG tablet TAKE 1/2 TABLET BY MOUTH DAILY 15 tablet 1  . hydrochlorothiazide (HYDRODIURIL) 25 MG tablet TAKE 0.5 TABLETS (12.5 MG TOTAL) BY MOUTH DAILY. 15 tablet 6  . HYDROcodone-acetaminophen (VICODIN) 5-500 MG per tablet Take 1 tablet by mouth every 6 (six) hours as needed.    . isosorbide mononitrate (IMDUR) 60 MG 24 hr tablet Take 0.5 tablets (30 mg total) by mouth daily. 15 tablet 7  . Nebivolol HCl (BYSTOLIC) 20 MG TABS Take 1 tablet by mouth daily.     . nitroGLYCERIN (NITROSTAT) 0.4 MG SL tablet Place 1 tablet (0.4 mg total) under the tongue every 5 (five) minutes as needed. 25 tablet 3  . pantoprazole (PROTONIX) 40 MG tablet Take 1 tablet (40 mg total) by mouth daily. 30 tablet 11  . ramipril (ALTACE) 5 MG capsule Take 5 mg by mouth daily.    . simvastatin (ZOCOR) 40 MG tablet Take 1 tablet (40 mg total) by mouth daily at 6 PM. 30 tablet 6   No current facility-administered medications for this visit.     No Known Allergies  Social History   Social History  . Marital status: Married    Spouse name: [name]  . Number of children: 6  . 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 Smoking status: Current Every Day Smoker    Packs/day: 1.00    Years: 38.00    Types: Cigarettes    Start date: 10/21/1971  . Smokeless tobacco: Never Used  . Alcohol use 2.0 - 2.5 oz/week    4 - 5 drink(s) per week  . Drug use: No  . Sexual activity: Yes    Partners: Female   Other Topics Concern  . Not on file   Social History Narrative   Lives with his wife and one  daughter.     Review of Systems: General: negative for chills, fever, night sweats or weight changes.  Cardiovascular: negative for chest pain, dyspnea on exertion, edema, orthopnea, palpitations, paroxysmal nocturnal dyspnea or shortness of breath Dermatological: negative for rash Respiratory: negative for cough or wheezing Urologic: negative for hematuria Abdominal: negative for nausea, vomiting, diarrhea, bright red blood per rectum, melena, or hematemesis Neurologic: negative for visual changes, syncope, or dizziness All other systems reviewed and are otherwise negative except as noted above.    Blood pressure (!) 168/97, pulse 95, height 5' 9" (1.753 m), weight 128 lb 12.8 oz (58.4 kg).  General appearance: alert, cooperative, no distress and thin Neck: no carotid bruit and no JVD Lungs: crackles Lt  base Heart: regular rate and rhythm Extremities: changes of scleroderma in his fingers Skin: taught, smooth skin Neurologic: Grossly normal  EKG NSR, small inf Qs, no change  ASSESSMENT AND PLAN:   Acute CHF (congestive heart failure) (HCC) Referred by Dr Elsworth Soho for CM and CHF on CXR. Pt is minimally symptomatic, has cough, worse at night  Scleroderma Pt was sent to his Dr Elsworth Soho by Dr Patrecia Pour secondary to hematuria which the pt denies. A CXR at [facility] office suggested CHF and the pt was referred here.  Essential hypertension "White coat HTN". He has not been taking his Bystolic "ran out"  CAD S/P percutaneous coronary angioplasty Staged (pericardial window done first) RCA STENT placement 2010. Myoview low risk Jan 2014 No angina  Chronic renal insufficiency, stage 3 (moderate) He has an appointment with [facility] for evaluation, last SCr was 1.5  Pericardial effusion S/P pericardial window Sept 2010 Echo 2014 EF 55-60% with grade 1 DD, small pericardial effusion   PLAN  Pt admits he hasn't been taking Bystolic as prescribed. He is scared of his B/P being "too low". He denies any orthopnea or DOE, and I'm hesitant to increase his diuretic (which he may or may not be taking) with renal insufficiency.   I suggested we get an echo ASAP but doubt recurrence of pericardial effusion (I noted no paradox on exam).  I suggested he continue his current medication except Bystolic which I'll resume at a lower dose. I ordered an BNP and BMP- further follow base on labs.and echo.  Kerin Ransom PA-C 02/24/2016 12:19 PM

## 2016-02-24 NOTE — Assessment & Plan Note (Signed)
S/P pericardial window Sept 2010 Echo 2014 EF 55-60% with grade 1 DD, small pericardial effusion

## 2016-02-24 NOTE — Patient Instructions (Signed)
Medication Instructions:  NO CHANGES  Labwork: TODAY BNP AND BMP AT [facility]  Testing/Procedures: Your physician has requested that you have an echocardiogram-TOMORROW. Echocardiography is a painless test that uses sound waves to create images of your heart. It provides your doctor with information about the size and shape of your heart and how well your heart's chambers and valves are working. This procedure takes approximately one hour. There are no restrictions for this procedure.   Follow-Up: Your physician recommends that you schedule a follow-up appointment in: [place]     If you need a refill on your cardiac medications before your next appointment, please call your pharmacy.

## 2016-02-24 NOTE — Assessment & Plan Note (Signed)
Pt was sent to his Dr Elsworth Soho by Dr Patrecia Pour secondary to hematuria which the pt denies. A CXR at [facility] office suggested CHF and the pt was referred here.

## 2016-02-24 NOTE — Assessment & Plan Note (Signed)
Staged (pericardial window done first) RCA STENT placement 2010. Myoview low risk Jan 2014 No angina

## 2016-02-24 NOTE — Assessment & Plan Note (Signed)
"  White coat HTN". He has not been taking his Bystolic "ran out"

## 2016-02-24 NOTE — Assessment & Plan Note (Signed)
Referred by Dr Elsworth Soho for CM and CHF on CXR. Pt is minimally symptomatic, has cough, worse at night

## 2016-02-24 NOTE — Assessment & Plan Note (Signed)
He has an appointment with [facility] for evaluation, last SCr was 1.5

## 2016-02-25 LAB — BASIC METABOLIC PANEL
BUN: 15 mg/dL (ref 7–25)
CO2: 29 mmol/L (ref 20–31)
Calcium: 7.5 mg/dL — ABNORMAL LOW (ref 8.6–10.3)
Chloride: 110 mmol/L (ref 98–110)
Creat: 1.73 mg/dL — ABNORMAL HIGH (ref 0.70–1.33)
Glucose, Bld: 84 mg/dL (ref 65–99)
Potassium: 3.9 mmol/L (ref 3.5–5.3)
Sodium: 143 mmol/L (ref 135–146)

## 2016-02-25 LAB — BRAIN NATRIURETIC PEPTIDE: Brain Natriuretic Peptide: 235.3 pg/mL — ABNORMAL HIGH (ref <100)

## 2016-02-29 ENCOUNTER — Telehealth: Payer: Self-pay | Admitting: *Deleted

## 2016-02-29 MED ORDER — HYDROCHLOROTHIAZIDE 25 MG PO TABS: 25.0000 mg | ORAL_TABLET | Freq: Every day | ORAL | 6 refills | Status: DC

## 2016-02-29 NOTE — Telephone Encounter (Signed)
Spoke with pt wife, aware of medication change. New script sent to the pharmacy

## 2016-02-29 NOTE — Telephone Encounter (Signed)
-----  Message from Erlene Quan, [place] sent at 02/25/2016 12:43 PM EDT ----- Ask pt to increase HCTZ to 25 mg daily. Keep f/u with Dolores PA-C 02/25/2016 12:43 PM

## 2016-03-06 ENCOUNTER — Telehealth: Payer: Self-pay | Admitting: Cardiovascular Disease

## 2016-03-06 NOTE — Telephone Encounter (Signed)
Records received from [place] for apt on 04/07/16 @ 10am with Dr [name]. Records given to [employer] (medical records) CN

## 2016-04-07 ENCOUNTER — Ambulatory Visit (INDEPENDENT_AMBULATORY_CARE_PROVIDER_SITE_OTHER): Payer: Commercial Managed Care - HMO | Admitting: Cardiovascular Disease

## 2016-04-07 ENCOUNTER — Encounter: Payer: Self-pay | Admitting: Cardiovascular Disease

## 2016-04-07 DIAGNOSIS — E7800 Pure hypercholesterolemia, unspecified: Secondary | ICD-10-CM

## 2016-04-07 DIAGNOSIS — I313 Pericardial effusion (noninflammatory): Secondary | ICD-10-CM

## 2016-04-07 DIAGNOSIS — F17200 Nicotine dependence, unspecified, uncomplicated: Secondary | ICD-10-CM | POA: Diagnosis not present

## 2016-04-07 DIAGNOSIS — I2510 Atherosclerotic heart disease of native coronary artery without angina pectoris: Secondary | ICD-10-CM | POA: Diagnosis not present

## 2016-04-07 DIAGNOSIS — I10 Essential (primary) hypertension: Secondary | ICD-10-CM

## 2016-04-07 DIAGNOSIS — I3139 Other pericardial effusion (noninflammatory): Secondary | ICD-10-CM

## 2016-04-07 DIAGNOSIS — Z9861 Coronary angioplasty status: Secondary | ICD-10-CM

## 2016-04-07 NOTE — Progress Notes (Signed)
04/07/2016 [name]   May 23, 1960  [id number]  Primary Physician Tivis Ringer, MD Primary Cardiologist: Lorretta Harp MD [name]  HPI:  The patient is a 55 year old, thin-appearing, African American male, father of 65, grandfather to 2 grandchildren who I last saw 05/14/15. 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 I catheterized him December 24, 2008, revealing a tight distal RCA stenosis which I ultimately stented with a bare-metal stent on January 07, 2009. He continues to smoke a pack a day. Dr. Merilynn Finland performed a subxiphoid window removing 200 mL of pericardial fluid which resulted in clinical improvement at that time. Followup echo performed November 2011 showed no effusion with normal EF. His other problems include hypertension and hyperlipidemia. He is otherwise asymptomatic and denied any chest pain or shortness of breath.  The patient admits to not taking his simvastatin. His last Myoview stress test performed 05/14/12 was nonischemic. He denies chest pain but does have some dyspnea on exertion probably related to COPD. He had a recent chest x-ray performed because of chronic cough suspected bronchitis that was read as "consistent with congestive heart failure. Recent 2-D echo revealed normal LV systolic function, grade 2 diastolic dysfunction with moderate pericardial effusion without evidence of pericardial tamponade .   Current Outpatient Prescriptions  Medication Sig Dispense Refill  . allopurinol (ZYLOPRIM) 100 MG tablet Take 100 mg by mouth daily.    [name] ALPRAZolam (XANAX) 0.5 MG tablet Take 0.5 mg by mouth every 6 (six) hours as needed.    [name] aspirin 81 MG chewable tablet Chew 81 mg by mouth daily. Pt taking once a week    . Cholecalciferol (VITAMIN D3) 50000 units CAPS Once weekly x12 weeks 12 capsule 0  . colchicine 0.6 MG  tablet Take 0.6 mg by mouth daily as needed.    . fluticasone (FLONASE) 50 MCG/ACT nasal spray Place 2 sprays into both nostrils daily as needed.  11  . hydrochlorothiazide (HYDRODIURIL) 25 MG tablet Take 1 tablet (25 mg total) by mouth daily. 30 tablet 6  . HYDROcodone-acetaminophen (VICODIN) 5-500 MG per tablet Take 1 tablet by mouth every 6 (six) hours as needed.    . isosorbide mononitrate (IMDUR) 60 MG 24 hr tablet Take 0.5 tablets (30 mg total) by mouth daily. 15 tablet 7  . nebivolol (BYSTOLIC) 10 MG tablet Take 1 tablet (10 mg total) by mouth daily. 30 tablet 5  . nitroGLYCERIN (NITROSTAT) 0.4 MG SL tablet Place 1 tablet (0.4 mg total) under the tongue every 5 (five) minutes as needed. 25 tablet 3  . pantoprazole (PROTONIX) 40 MG tablet Take 1 tablet (40 mg total) by mouth daily. 30 tablet 11  . ramipril (ALTACE) 5 MG capsule Take 5 mg by mouth daily.    . simvastatin (ZOCOR) 40 MG tablet Take 1 tablet (40 mg total) by mouth daily at 6 PM. 30 tablet 6   No current facility-administered medications for this visit.     No Known Allergies  Social History   Social History  . Marital status: Married    Spouse name: [name]  . Number of children: 6  . 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 Smoking status: Current Every Day Smoker    Packs/day: 1.00    Years: 38.00  Types: Cigarettes    Start date: 10/21/1971  . Smokeless tobacco: Never Used  . Alcohol use 2.0 - 2.5 oz/week    4 - 5 drink(s) per week  . Drug use: No  . Sexual activity: Yes    Partners: Female   Other Topics Concern  . Not on file   Social History Narrative   Lives with his wife and one daughter.     Review of Systems: General: negative for chills, fever, night sweats or weight changes.  Cardiovascular: negative for chest pain, dyspnea on exertion, edema, orthopnea, palpitations, paroxysmal nocturnal dyspnea or shortness of  breath Dermatological: negative for rash Respiratory: negative for cough or wheezing Urologic: negative for hematuria Abdominal: negative for nausea, vomiting, diarrhea, bright red blood per rectum, melena, or hematemesis Neurologic: negative for visual changes, syncope, or dizziness All other systems reviewed and are otherwise negative except as noted above.    Blood pressure 130/90, pulse 70, height _0  (1.727 m), weight 120 lb 12.8 oz (54.8 kg).  General appearance: alert and no distress Neck: no adenopathy, no carotid bruit, no JVD, supple, symmetrical, trachea midline and thyroid not enlarged, symmetric, no tenderness/mass/nodules Lungs: clear to auscultation bilaterally Heart: regular rate and rhythm, S1, S2 normal, no murmur, click, rub or gallop Extremities: extremities normal, atraumatic, no cyanosis or edema  EKG not performed today  ASSESSMENT AND PLAN:   Smoker History of continued tobacco abuse recalcitrant risk factor modification.  CAD S/P percutaneous coronary angioplasty History of CAD status post distal RCA stenting with bare metal stent by myself 01/07/09. He has done well since. He denies chest pain.  Hyperlipidemia History of hyperlipidemia on statin therapy followed by his PCP  Pericardial effusion History of moderate pericardial effusion probably related to scleroderma status post subxiphoid window and pericardial fluid drainage by Dr. [name] in the past. Recent 2-D echocardiogram performed in evaluation of radiographic heart failure 02/24/16 revealed normal LV systolic function, grade 2 diastolic dysfunction dysfunction with a moderate circumferential pericardial effusion and no evidence of pericardial tamponade.  Essential hypertension History of hypertension blood pressure measured at 130/90. He is on Bystolic  and ramipril as well as hydrochlorothiazide. Continue current meds are current dosing      Lorretta Harp MD [facility],  [facility] 04/07/2016 10:31 AM

## 2016-04-07 NOTE — Patient Instructions (Signed)
Medication Instructions: Your physician recommends that you continue on your current medications as directed. Please refer to the Current Medication list given to you today.   Follow-Up: Your physician wants you to follow-up in: 1 year with Dr. [name]. You will receive a reminder letter in the mail two months in advance. If you don't receive a letter, please call our office to schedule the follow-up appointment.  If you need a refill on your cardiac medications before your next appointment, please call your pharmacy.

## 2016-04-07 NOTE — Assessment & Plan Note (Signed)
History of moderate pericardial effusion probably related to scleroderma status post subxiphoid window and pericardial fluid drainage by Dr. [name] in the past. Recent 2-D echocardiogram performed in evaluation of radiographic heart failure 02/24/16 revealed normal LV systolic function, grade 2 diastolic dysfunction dysfunction with a moderate circumferential pericardial effusion and no evidence of pericardial tamponade.

## 2016-04-07 NOTE — Assessment & Plan Note (Signed)
History of hyperlipidemia on statin therapy followed by his PCP

## 2016-04-07 NOTE — Assessment & Plan Note (Addendum)
History of hypertension blood pressure measured at 130/90. He is on Bystolic  and ramipril as well as hydrochlorothiazide. Continue current meds are current dosing

## 2016-04-07 NOTE — Assessment & Plan Note (Signed)
History of continued tobacco abuse recalcitrant risk factor modification.

## 2016-04-07 NOTE — Assessment & Plan Note (Signed)
History of CAD status post distal RCA stenting with bare metal stent by myself 01/07/09. He has done well since. He denies chest pain.

## 2016-04-24 HISTORY — PX: RENAL BIOPSY: SHX156

## 2016-05-06 ENCOUNTER — Other Ambulatory Visit: Payer: Self-pay | Admitting: Rheumatology

## 2016-05-12 ENCOUNTER — Ambulatory Visit: Payer: 59 | Admitting: Rheumatology

## 2016-05-12 ENCOUNTER — Ambulatory Visit: Payer: Commercial Managed Care - HMO | Admitting: Rheumatology

## 2016-05-19 DIAGNOSIS — R3121 Asymptomatic microscopic hematuria: Secondary | ICD-10-CM | POA: Diagnosis not present

## 2016-05-24 DIAGNOSIS — I7300 Raynaud's syndrome without gangrene: Secondary | ICD-10-CM | POA: Insufficient documentation

## 2016-05-26 ENCOUNTER — Ambulatory Visit (INDEPENDENT_AMBULATORY_CARE_PROVIDER_SITE_OTHER): Payer: Commercial Managed Care - HMO | Admitting: Rheumatology

## 2016-05-26 ENCOUNTER — Encounter: Payer: Self-pay | Admitting: Rheumatology

## 2016-05-26 VITALS — BP 138/82 | HR 72 | Resp 14 | Ht 69.0 in | Wt 138.0 lb

## 2016-05-26 DIAGNOSIS — M349 Systemic sclerosis, unspecified: Secondary | ICD-10-CM

## 2016-05-26 DIAGNOSIS — D649 Anemia, unspecified: Secondary | ICD-10-CM

## 2016-05-26 DIAGNOSIS — F17200 Nicotine dependence, unspecified, uncomplicated: Secondary | ICD-10-CM | POA: Diagnosis not present

## 2016-05-26 DIAGNOSIS — I7300 Raynaud's syndrome without gangrene: Secondary | ICD-10-CM | POA: Diagnosis not present

## 2016-05-26 DIAGNOSIS — R809 Proteinuria, unspecified: Secondary | ICD-10-CM | POA: Diagnosis not present

## 2016-05-26 DIAGNOSIS — R3121 Asymptomatic microscopic hematuria: Secondary | ICD-10-CM

## 2016-05-26 DIAGNOSIS — M1A9XX0 Chronic gout, unspecified, without tophus (tophi): Secondary | ICD-10-CM

## 2016-05-26 LAB — CBC WITH DIFFERENTIAL/PLATELET
Basophils Absolute: 0 cells/uL (ref 0–200)
Basophils Relative: 0 %
EOS ABS: 245 {cells}/uL (ref 15–500)
EOS PCT: 5 %
HCT: 34.4 % — ABNORMAL LOW (ref 38.5–50.0)
Hemoglobin: 11.2 g/dL — ABNORMAL LOW (ref 13.2–17.1)
LYMPHS PCT: 29 %
Lymphs Abs: 1421 cells/uL (ref 850–3900)
MCH: 29.7 pg (ref 27.0–33.0)
MCHC: 32.6 g/dL (ref 32.0–36.0)
MCV: 91.2 fL (ref 80.0–100.0)
MONOS PCT: 10 %
MPV: 10.1 fL (ref 7.5–12.5)
Monocytes Absolute: 490 cells/uL (ref 200–950)
Neutro Abs: 2744 cells/uL (ref 1500–7800)
Neutrophils Relative %: 56 %
PLATELETS: 183 10*3/uL (ref 140–400)
RBC: 3.77 MIL/uL — AB (ref 4.20–5.80)
RDW: 15.7 % — AB (ref 11.0–15.0)
WBC: 4.9 10*3/uL (ref 3.8–10.8)

## 2016-05-26 LAB — COMPLETE METABOLIC PANEL WITH GFR
ALT: 7 U/L — ABNORMAL LOW (ref 9–46)
AST: 14 U/L (ref 10–35)
Albumin: 2.4 g/dL — ABNORMAL LOW (ref 3.6–5.1)
Alkaline Phosphatase: 71 U/L (ref 40–115)
BUN: 29 mg/dL — ABNORMAL HIGH (ref 7–25)
CHLORIDE: 111 mmol/L — AB (ref 98–110)
CO2: 27 mmol/L (ref 20–31)
CREATININE: 1.7 mg/dL — AB (ref 0.70–1.33)
Calcium: 7.9 mg/dL — ABNORMAL LOW (ref 8.6–10.3)
GFR, EST AFRICAN AMERICAN: 51 mL/min — AB (ref >=60)
GFR, Est Non African American: 44 mL/min — ABNORMAL LOW (ref >=60)
Glucose, Bld: 88 mg/dL (ref 65–99)
Potassium: 3.9 mmol/L (ref 3.5–5.3)
SODIUM: 141 mmol/L (ref 135–146)
Total Bilirubin: 0.3 mg/dL (ref 0.2–1.2)
Total Protein: 5.6 g/dL — ABNORMAL LOW (ref 6.1–8.1)
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
## 2016-05-27 LAB — URINALYSIS, MICROSCOPIC ONLY
BACTERIA UA: NONE SEEN [HPF]
CRYSTALS: NONE SEEN [HPF]
Casts: NONE SEEN [LPF]
Yeast: NONE SEEN [HPF]

## 2016-05-27 LAB — URIC ACID: Uric Acid, Serum: 5.2 mg/dL (ref 4.0–8.0)

## 2016-05-27 LAB — URINALYSIS, ROUTINE W REFLEX MICROSCOPIC
Bilirubin Urine: NEGATIVE
Glucose, UA: NEGATIVE
Ketones, ur: NEGATIVE
LEUKOCYTES UA: NEGATIVE
NITRITE: NEGATIVE
PH: 6 (ref 5.0–8.0)
SPECIFIC GRAVITY, URINE: 1.018 (ref 1.001–1.035)

## 2016-06-15 ENCOUNTER — Other Ambulatory Visit: Payer: Self-pay | Admitting: Cardiovascular Disease

## 2016-06-15 NOTE — Telephone Encounter (Signed)
Rx(s) sent to pharmacy electronically.

## 2016-07-23 ENCOUNTER — Other Ambulatory Visit: Payer: Self-pay | Admitting: Cardiovascular Disease

## 2016-07-23 ENCOUNTER — Other Ambulatory Visit: Payer: Self-pay | Admitting: Rheumatology

## 2016-08-05 ENCOUNTER — Other Ambulatory Visit: Payer: Self-pay | Admitting: Rheumatology

## 2016-08-05 ENCOUNTER — Other Ambulatory Visit: Payer: Self-pay | Admitting: Cardiovascular Disease

## 2016-08-07 NOTE — Telephone Encounter (Signed)
Last Visit: 05/26/16 Next Visit: 09/22/16 Labs: 05/26/16 Mild anemia  Okay to refill allopurinol?

## 2016-08-31 ENCOUNTER — Other Ambulatory Visit: Payer: Self-pay | Admitting: *Deleted

## 2016-08-31 MED ORDER — ISOSORBIDE MONONITRATE ER 60 MG PO TB24: 30.0000 mg | ORAL_TABLET | Freq: Every day | ORAL | 11 refills | Status: DC
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
## 2016-09-22 ENCOUNTER — Encounter: Payer: Self-pay | Admitting: Rheumatology

## 2016-09-22 ENCOUNTER — Ambulatory Visit (INDEPENDENT_AMBULATORY_CARE_PROVIDER_SITE_OTHER): Payer: Commercial Managed Care - HMO | Admitting: Rheumatology

## 2016-09-22 VITALS — BP 139/80 | HR 65 | Resp 12 | Ht 64.0 in | Wt 122.0 lb

## 2016-09-22 DIAGNOSIS — Z8679 Personal history of other diseases of the circulatory system: Secondary | ICD-10-CM

## 2016-09-22 DIAGNOSIS — L80 Vitiligo: Secondary | ICD-10-CM

## 2016-09-22 DIAGNOSIS — Z87448 Personal history of other diseases of urinary system: Secondary | ICD-10-CM

## 2016-09-22 DIAGNOSIS — M349 Systemic sclerosis, unspecified: Secondary | ICD-10-CM

## 2016-09-22 DIAGNOSIS — F17200 Nicotine dependence, unspecified, uncomplicated: Secondary | ICD-10-CM | POA: Diagnosis not present

## 2016-09-22 DIAGNOSIS — Z8709 Personal history of other diseases of the respiratory system: Secondary | ICD-10-CM | POA: Diagnosis not present

## 2016-09-22 DIAGNOSIS — M1A09X0 Idiopathic chronic gout, multiple sites, without tophus (tophi): Secondary | ICD-10-CM | POA: Diagnosis not present

## 2016-09-22 DIAGNOSIS — I7300 Raynaud's syndrome without gangrene: Secondary | ICD-10-CM

## 2016-09-22 NOTE — Patient Instructions (Signed)
Advised appointment with pulmonologist as soon as possible  CBC, CMP, UA, uric acid due in August

## 2016-10-02 ENCOUNTER — Other Ambulatory Visit: Payer: Self-pay | Admitting: Cardiology

## 2016-10-02 NOTE — Telephone Encounter (Signed)
Rx(s) sent to pharmacy electronically.  

## 2016-11-08 ENCOUNTER — Other Ambulatory Visit: Payer: Self-pay | Admitting: Rheumatology

## 2016-11-08 NOTE — Telephone Encounter (Signed)
Last Visit: 09/22/16 Next Visit: 03/23/17 Labs: 05/26/16 CBC/ CMP mild anemia rest normal  Okay to refill per Dr. Estanislado Pandy

## 2017-01-19 DIAGNOSIS — I10 Essential (primary) hypertension: Secondary | ICD-10-CM | POA: Diagnosis not present

## 2017-01-19 DIAGNOSIS — E7439 Other disorders of intestinal carbohydrate absorption: Secondary | ICD-10-CM | POA: Diagnosis not present

## 2017-01-19 DIAGNOSIS — Z0000 Encounter for general adult medical examination without abnormal findings: Secondary | ICD-10-CM | POA: Diagnosis not present

## 2017-01-19 DIAGNOSIS — E038 Other specified hypothyroidism: Secondary | ICD-10-CM | POA: Diagnosis not present

## 2017-01-23 DIAGNOSIS — I10 Essential (primary) hypertension: Secondary | ICD-10-CM | POA: Diagnosis not present

## 2017-01-23 DIAGNOSIS — Z23 Encounter for immunization: Secondary | ICD-10-CM | POA: Diagnosis not present

## 2017-01-23 DIAGNOSIS — D649 Anemia, unspecified: Secondary | ICD-10-CM | POA: Diagnosis not present

## 2017-01-23 DIAGNOSIS — Z9861 Coronary angioplasty status: Secondary | ICD-10-CM | POA: Diagnosis not present

## 2017-01-23 DIAGNOSIS — Z0000 Encounter for general adult medical examination without abnormal findings: Secondary | ICD-10-CM | POA: Diagnosis not present
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
## 2017-03-23 ENCOUNTER — Ambulatory Visit: Payer: Commercial Managed Care - HMO | Admitting: Rheumatology

## 2017-03-23 ENCOUNTER — Encounter: Payer: Self-pay | Admitting: Rheumatology

## 2017-03-23 VITALS — BP 157/85 | HR 68 | Ht 68.0 in | Wt 122.0 lb

## 2017-03-23 DIAGNOSIS — Z8709 Personal history of other diseases of the respiratory system: Secondary | ICD-10-CM

## 2017-03-23 DIAGNOSIS — I7300 Raynaud's syndrome without gangrene: Secondary | ICD-10-CM | POA: Diagnosis not present

## 2017-03-23 DIAGNOSIS — Z87448 Personal history of other diseases of urinary system: Secondary | ICD-10-CM

## 2017-03-23 DIAGNOSIS — Z8679 Personal history of other diseases of the circulatory system: Secondary | ICD-10-CM

## 2017-03-23 DIAGNOSIS — M349 Systemic sclerosis, unspecified: Secondary | ICD-10-CM | POA: Diagnosis not present

## 2017-03-23 DIAGNOSIS — M1A09X0 Idiopathic chronic gout, multiple sites, without tophus (tophi): Secondary | ICD-10-CM

## 2017-03-23 DIAGNOSIS — F17200 Nicotine dependence, unspecified, uncomplicated: Secondary | ICD-10-CM | POA: Diagnosis not present

## 2017-03-23 DIAGNOSIS — Z5181 Encounter for therapeutic drug level monitoring: Secondary | ICD-10-CM

## 2017-03-23 DIAGNOSIS — L80 Vitiligo: Secondary | ICD-10-CM

## 2017-03-23 MED ORDER — COLCHICINE 0.6 MG PO TABS: 0.6000 mg | ORAL_TABLET | Freq: Every day | ORAL | 2 refills | Status: DC | PRN

## 2017-03-24 LAB — COMPLETE METABOLIC PANEL WITH GFR
AG RATIO: 0.9 (calc) — AB (ref 1.0–2.5)
ALKALINE PHOSPHATASE (APISO): 81 U/L (ref 40–115)
ALT: 5 U/L — AB (ref 9–46)
AST: 12 U/L (ref 10–35)
Albumin: 2.5 g/dL — ABNORMAL LOW (ref 3.6–5.1)
BILIRUBIN TOTAL: 0.3 mg/dL (ref 0.2–1.2)
BUN / CREAT RATIO: 13 (calc) (ref 6–22)
BUN: 27 mg/dL — AB (ref 7–25)
CHLORIDE: 111 mmol/L — AB (ref 98–110)
CO2: 24 mmol/L (ref 20–32)
CREATININE: 2.01 mg/dL — AB (ref 0.70–1.33)
Calcium: 7.7 mg/dL — ABNORMAL LOW (ref 8.6–10.3)
GFR, Est African American: 41 mL/min/{1.73_m2} — ABNORMAL LOW (ref >=60)
GFR, Est Non African American: 36 mL/min/{1.73_m2} — ABNORMAL LOW (ref >=60)
GLUCOSE: 91 mg/dL (ref 65–99)
Globulin: 2.7 g/dL (calc) (ref 1.9–3.7)
Potassium: 3.8 mmol/L (ref 3.5–5.3)
Sodium: 143 mmol/L (ref 135–146)
Total Protein: 5.2 g/dL — ABNORMAL LOW (ref 6.1–8.1)

## 2017-03-24 LAB — URINALYSIS, ROUTINE W REFLEX MICROSCOPIC
BACTERIA UA: NONE SEEN /HPF
Bilirubin Urine: NEGATIVE
GLUCOSE, UA: NEGATIVE
LEUKOCYTES UA: NEGATIVE
Nitrite: NEGATIVE
PH: 5.5 (ref 5.0–8.0)
SPECIFIC GRAVITY, URINE: 1.022 (ref 1.001–1.03)
Squamous Epithelial / LPF: NONE SEEN /HPF (ref <=5)

## 2017-03-24 LAB — CBC WITH DIFFERENTIAL/PLATELET
Basophils Absolute: 31 cells/uL (ref 0–200)
Basophils Relative: 0.7 %
EOS PCT: 2.9 %
Eosinophils Absolute: 128 cells/uL (ref 15–500)
HEMATOCRIT: 33.3 % — AB (ref 38.5–50.0)
Hemoglobin: 11.1 g/dL — ABNORMAL LOW (ref 13.2–17.1)
LYMPHS ABS: 1223 {cells}/uL (ref 850–3900)
MCH: 30.2 pg (ref 27.0–33.0)
MCHC: 33.3 g/dL (ref 32.0–36.0)
MCV: 90.7 fL (ref 80.0–100.0)
MONOS PCT: 9.5 %
MPV: 10.8 fL (ref 7.5–12.5)
NEUTROS ABS: 2600 {cells}/uL (ref 1500–7800)
NEUTROS PCT: 59.1 %
Platelets: 209 10*3/uL (ref 140–400)
RBC: 3.67 10*6/uL — AB (ref 4.20–5.80)
RDW: 13.5 % (ref 11.0–15.0)
Total Lymphocyte: 27.8 %
WBC mixed population: 418 cells/uL (ref 200–950)
WBC: 4.4 10*3/uL (ref 3.8–10.8)

## 2017-03-24 LAB — URIC ACID: URIC ACID, SERUM: 6.2 mg/dL (ref 4.0–8.0)

## 2017-03-26 DIAGNOSIS — Z5181 Encounter for therapeutic drug level monitoring: Secondary | ICD-10-CM | POA: Diagnosis not present

## 2017-03-26 NOTE — Progress Notes (Signed)
Anemia of chronic disease. His GFR continues to be low. Please refer him to nephrology.

## 2017-03-27 LAB — PAIN MGMT, PROFILE 5 W/CONF, U
AMPHETAMINES: NEGATIVE ng/mL (ref <500)
Barbiturates: NEGATIVE ng/mL (ref <300)
Benzodiazepines: NEGATIVE ng/mL (ref <100)
CREATININE: 170.5 mg/dL
Cocaine Metabolite: NEGATIVE ng/mL (ref <150)
MARIJUANA METABOLITE: NEGATIVE ng/mL (ref <20)
Methadone Metabolite: NEGATIVE ng/mL (ref <100)
OXIDANT: NEGATIVE ug/mL (ref <200)
Opiates: NEGATIVE ng/mL (ref <100)
Oxycodone: NEGATIVE ng/mL (ref <100)
PH: 6.2 (ref 4.5–9.0)

## 2017-03-29 ENCOUNTER — Other Ambulatory Visit: Payer: Self-pay | Admitting: *Deleted

## 2017-03-29 DIAGNOSIS — R7989 Other specified abnormal findings of blood chemistry: Secondary | ICD-10-CM

## 2017-03-29 DIAGNOSIS — R944 Abnormal results of kidney function studies: Secondary | ICD-10-CM

## 2017-04-06 DIAGNOSIS — R809 Proteinuria, unspecified: Secondary | ICD-10-CM | POA: Diagnosis not present

## 2017-04-06 DIAGNOSIS — N183 Chronic kidney disease, stage 3 (moderate): Secondary | ICD-10-CM | POA: Diagnosis not present

## 2017-04-06 DIAGNOSIS — I129 Hypertensive chronic kidney disease with stage 1 through stage 4 chronic kidney disease, or unspecified chronic kidney disease: Secondary | ICD-10-CM | POA: Diagnosis not present

## 2017-04-06 DIAGNOSIS — R3129 Other microscopic hematuria: Secondary | ICD-10-CM | POA: Diagnosis not present

## 2017-04-13 ENCOUNTER — Ambulatory Visit: Payer: 59 | Admitting: Cardiovascular Disease

## 2017-04-13 ENCOUNTER — Encounter: Payer: Self-pay | Admitting: Cardiovascular Disease

## 2017-04-13 VITALS — BP 122/79 | HR 68 | Ht 68.0 in | Wt 124.2 lb

## 2017-04-13 DIAGNOSIS — E7800 Pure hypercholesterolemia, unspecified: Secondary | ICD-10-CM

## 2017-04-13 DIAGNOSIS — Z9861 Coronary angioplasty status: Secondary | ICD-10-CM

## 2017-04-13 DIAGNOSIS — I2510 Atherosclerotic heart disease of native coronary artery without angina pectoris: Secondary | ICD-10-CM | POA: Diagnosis not present

## 2017-04-13 DIAGNOSIS — F17200 Nicotine dependence, unspecified, uncomplicated: Secondary | ICD-10-CM

## 2017-04-13 DIAGNOSIS — I313 Pericardial effusion (noninflammatory): Secondary | ICD-10-CM

## 2017-04-13 DIAGNOSIS — I10 Essential (primary) hypertension: Secondary | ICD-10-CM

## 2017-04-13 DIAGNOSIS — I3139 Other pericardial effusion (noninflammatory): Secondary | ICD-10-CM

## 2017-04-13 NOTE — Patient Instructions (Signed)
Medication Instructions: Your physician recommends that you continue on your current medications as directed. Please refer to the Current Medication list given to you today.   Follow-Up: Your physician wants you to follow-up in: 1 year with Dr. [name]. You will receive a reminder letter in the mail two months in advance. If you don't receive a letter, please call our office to schedule the follow-up appointment.  If you need a refill on your cardiac medications before your next appointment, please call your pharmacy.

## 2017-04-13 NOTE — Progress Notes (Signed)
04/13/2017 [name]   24-Mar-1961  [id number]  Primary Physician [name], MD Primary Cardiologist: Lorretta Harp MD Lupe Skinner, Cody  HPI:  [name] is a 56 y.o.  thin-appearing, African American male, father of 37, grandfather to 2 grandchildren who I last saw  04/07/16. He is accompanied by his wife today.[name] He has a history of idiopathic pericardial effusion as well as scleroderma, hypertension, hyperlipidemia with low HDL. I catheterized him December 24, 2008, revealing a tight distal RCA stenosis which I ultimately stented with a bare-metal stent on January 07, 2009. He continues to smoke a pack a day. Dr. Merilynn Finland performed a subxiphoid window removing 200 mL of pericardial fluid which resulted in clinical improvement at that time. Followup echo performed November 2011 showed no effusion with normal EF. His other problems include hypertension and hyperlipidemia. He is otherwise asymptomatic and denied any chest pain or shortness of breath. The patient admits to not taking his simvastatin. His last Myoview stress test performed 05/14/12 was nonischemic. He denies chest pain but does have some dyspnea on exertion probably related to COPD. He had a recent chest x-ray performed because of chronic cough suspected bronchitis that was read as "consistent with congestive heart failure. His last 2-D echo 02/24/16 revealed normal LV systolic function, grade 2 diastolic dysfunction with moderate pericardial effusion without evidence of pericardial tamponade . Since I saw him a year ago he's remained stable. He was referred to a nephrologist because of worsening renal function.     Current Meds  Medication Sig  . allopurinol (ZYLOPRIM) 100 MG tablet TAKE 1 TABLET BY MOUTH ONCE A DAY  . ALPRAZolam (XANAX) 0.5 MG tablet Take 0.5 mg by mouth every 6 (six) hours as needed.  [name] amLODipine (NORVASC) 10 MG tablet Take 10 mg by mouth daily.  [name] aspirin 81 MG  chewable tablet Chew 81 mg by mouth daily. Pt taking once a week  . Cholecalciferol (VITAMIN D3) 50000 units CAPS Once weekly x12 weeks  . colchicine 0.6 MG tablet Take 1 tablet (0.6 mg total) by mouth daily as needed.  [name] escitalopram (LEXAPRO) 5 MG tablet Take 5 mg by mouth daily.  . ferrous sulfate 325 (65 FE) MG EC tablet Take 325 mg by mouth 1 day or 1 dose.  . fluticasone (FLONASE) 50 MCG/ACT nasal spray Place 2 sprays into both nostrils daily as needed.  . hydrochlorothiazide (HYDRODIURIL) 25 MG tablet TAKE 1 TABLET BY MOUTH DAILY  . HYDROcodone-acetaminophen (VICODIN) 5-500 MG per tablet Take 1 tablet by mouth every 6 (six) hours as needed.  . isosorbide mononitrate (IMDUR) 60 MG 24 hr tablet Take 0.5 tablets (30 mg total) by mouth daily.  . nebivolol (BYSTOLIC) 10 MG tablet Take 1 tablet (10 mg total) by mouth daily.  . nitroGLYCERIN (NITROSTAT) 0.4 MG SL tablet Place 1 tablet (0.4 mg total) under the tongue every 5 (five) minutes as needed.  . pantoprazole (PROTONIX) 40 MG tablet Take 1 tablet (40 mg total) by mouth daily.  . ramipril (ALTACE) 5 MG capsule Take 5 mg by mouth daily.  . simvastatin (ZOCOR) 40 MG tablet Take 1 tablet (40 mg total) by mouth daily at 6 PM.  . [DISCONTINUED] simvastatin (ZOCOR) 40 MG tablet TAKE 1 TABLET BY MOUTH DAILY AT 6PM     No Known Allergies  Social History   Socioeconomic History  . Marital status: Married    Spouse name: [name]  . Number of children:  6  . Years of education: 27  . Highest education level: Not on file  Social Needs  . Financial resource strain: Not on file  . Food insecurity - worry: Not on file  . Food insecurity - inability: Not on file  . Transportation needs - medical: Not on file  . Transportation needs - non-medical: Not on file  Occupational History  . 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 Smoking status: Current Every Day Smoker    Packs/day: 1.00    Years: 38.00    Pack years: 38.00    Types:  Cigarettes    Start date: 10/21/1971  . Smokeless tobacco: Never Used  Substance and Sexual Activity  . Alcohol use: Yes    Alcohol/week: 2.0 - 2.5 oz    Types: 4 - 5 drink(s) per week  . Drug use: No  . Sexual activity: Yes    Partners: Female  Other Topics Concern  . Not on file  Social History Narrative   Lives with his wife and one daughter.     Review of Systems: General: negative for chills, fever, night sweats or weight changes.  Cardiovascular: negative for chest pain, dyspnea on exertion, edema, orthopnea, palpitations, paroxysmal nocturnal dyspnea or shortness of breath Dermatological: negative for rash Respiratory: negative for cough or wheezing Urologic: negative for hematuria Abdominal: negative for nausea, vomiting, diarrhea, bright red blood per rectum, melena, or hematemesis Neurologic: negative for visual changes, syncope, or dizziness All other systems reviewed and are otherwise negative except as noted above.    Blood pressure 122/79, pulse 68, height _0  (1.727 m), weight 124 lb 3.2 oz (56.3 kg).  General appearance: alert and no distress Neck: no adenopathy, no carotid bruit, no JVD, supple, symmetrical, trachea midline and thyroid not enlarged, symmetric, no tenderness/mass/nodules Lungs: clear to auscultation bilaterally Heart: regular rate and rhythm, S1, S2 normal, no murmur, click, rub or gallop Extremities: extremities normal, atraumatic, no cyanosis or edema Pulses: 2+ and symmetric Skin: Skin color, texture, turgor normal. No rashes or lesions Neurologic: Alert and oriented X 3, normal strength and tone. Normal symmetric reflexes. Normal coordination and gait  EKG sinus rhythm at 75 with evidence of left ventricular hypertrophy with repolarization changes. I personally reviewed this EKG.  ASSESSMENT AND PLAN:   Smoker History of continued tobacco abuse recalcitrant risk factor modification.  CAD S/P percutaneous coronary angioplasty History  of CAD status post PCI and stenting of a tight distal RCA with a bare metal stent 01/07/09. His last Myoview performed 05/14/12 was nonischemic. He denies chest pain or shortness of breath.  Hyperlipidemia History of hyperlipidemia on statin therapy followed by his PCP  Pericardial effusion History of pericardial effusion with last echo performed 02/25/16 revealing normal LV systolic function with grade 2 diastolic dysfunction and a moderate circumferential pericardial effusion without evidence of tamponade .  Essential hypertension History of essential hypertension with blood pressure measured today at 136/83 and right arm 122/79 in the left arm. He apparently saw his nephrologist who recommended beginning  amlodipine. He was already on Bystolic , an ACE inhibitor and a diuretic.      Lorretta Harp MD FACP,FACC,FAHA, [facility] 04/13/2017 9:16 AM

## 2017-04-13 NOTE — Assessment & Plan Note (Signed)
History of essential hypertension with blood pressure measured today at 136/83 and right arm 122/79 in the left arm. He apparently saw his nephrologist who recommended beginning  amlodipine. He was already on Bystolic , an ACE inhibitor and a diuretic.

## 2017-04-13 NOTE — Assessment & Plan Note (Signed)
History of hyperlipidemia on statin therapy followed by his PCP 

## 2017-04-13 NOTE — Assessment & Plan Note (Signed)
History of CAD status post PCI and stenting of a tight distal RCA with a bare metal stent 01/07/09. His last Myoview performed 05/14/12 was nonischemic. He denies chest pain or shortness of breath.

## 2017-04-13 NOTE — Assessment & Plan Note (Signed)
History of continued tobacco abuse recalcitrant risk factor modification. 

## 2017-04-13 NOTE — Assessment & Plan Note (Signed)
History of pericardial effusion with last echo performed 02/25/16 revealing normal LV systolic function with grade 2 diastolic dysfunction and a moderate circumferential pericardial effusion without evidence of tamponade .

## 2017-05-02 DIAGNOSIS — H04123 Dry eye syndrome of bilateral lacrimal glands: Secondary | ICD-10-CM | POA: Diagnosis not present

## 2017-05-11 ENCOUNTER — Other Ambulatory Visit (HOSPITAL_COMMUNITY): Payer: Self-pay | Admitting: Nephrology

## 2017-05-11 DIAGNOSIS — I10 Essential (primary) hypertension: Secondary | ICD-10-CM

## 2017-05-11 DIAGNOSIS — R809 Proteinuria, unspecified: Secondary | ICD-10-CM

## 2017-05-11 DIAGNOSIS — R3129 Other microscopic hematuria: Secondary | ICD-10-CM

## 2017-05-11 DIAGNOSIS — N183 Chronic kidney disease, stage 3 unspecified: Secondary | ICD-10-CM

## 2017-05-11 DIAGNOSIS — I129 Hypertensive chronic kidney disease with stage 1 through stage 4 chronic kidney disease, or unspecified chronic kidney disease: Secondary | ICD-10-CM | POA: Diagnosis not present

## 2017-05-16 ENCOUNTER — Other Ambulatory Visit: Payer: Self-pay | Admitting: Radiology

## 2017-05-18 ENCOUNTER — Other Ambulatory Visit: Payer: Self-pay | Admitting: General Surgery

## 2017-05-21 ENCOUNTER — Ambulatory Visit (HOSPITAL_COMMUNITY)
Admission: RE | Admit: 2017-05-21 | Discharge: 2017-05-21 | Disposition: A | Discharge status: Home / Self Care | Payer: 59 | Source: Ambulatory Visit | Attending: Nephrology | Admitting: Nephrology

## 2017-05-21 ENCOUNTER — Encounter (HOSPITAL_COMMUNITY): Payer: Self-pay

## 2017-05-21 DIAGNOSIS — I129 Hypertensive chronic kidney disease with stage 1 through stage 4 chronic kidney disease, or unspecified chronic kidney disease: Secondary | ICD-10-CM | POA: Insufficient documentation

## 2017-05-21 DIAGNOSIS — N183 Chronic kidney disease, stage 3 unspecified: Secondary | ICD-10-CM

## 2017-05-21 DIAGNOSIS — R3129 Other microscopic hematuria: Secondary | ICD-10-CM | POA: Insufficient documentation

## 2017-05-21 DIAGNOSIS — R809 Proteinuria, unspecified: Secondary | ICD-10-CM | POA: Diagnosis not present

## 2017-05-21 DIAGNOSIS — I10 Essential (primary) hypertension: Secondary | ICD-10-CM

## 2017-05-21 LAB — CBC
HCT: 35.1 % — ABNORMAL LOW (ref 39.0–52.0)
Hemoglobin: 11.3 g/dL — ABNORMAL LOW (ref 13.0–17.0)
MCH: 30.2 pg (ref 26.0–34.0)
MCHC: 32.2 g/dL (ref 30.0–36.0)
MCV: 93.9 fL (ref 78.0–100.0)
PLATELETS: 179 10*3/uL (ref 150–400)
RBC: 3.74 MIL/uL — ABNORMAL LOW (ref 4.22–5.81)
RDW: 15.6 % — AB (ref 11.5–15.5)
WBC: 5.9 10*3/uL (ref 4.0–10.5)

## 2017-05-21 LAB — APTT: aPTT: 31 seconds (ref 24–36)

## 2017-05-21 LAB — PROTIME-INR
INR: 0.99
Prothrombin Time: 13 seconds (ref 11.4–15.2)

## 2017-05-21 MED ORDER — SODIUM CHLORIDE 0.9 % IV SOLN: INTRAVENOUS | Status: DC

## 2017-05-21 MED ORDER — MIDAZOLAM HCL 2 MG/2ML IJ SOLN
INTRAMUSCULAR | Status: AC | PRN
  Administered 2017-05-21 (×2): 0.5 mg via INTRAVENOUS
  Administered 2017-05-21: 1 mg via INTRAVENOUS

## 2017-05-21 MED ORDER — MIDAZOLAM HCL 2 MG/2ML IJ SOLN
INTRAMUSCULAR | Status: AC
  Filled 2017-05-21: qty 2

## 2017-05-21 MED ORDER — LIDOCAINE HCL (PF) 1 % IJ SOLN
INTRAMUSCULAR | Status: AC
  Filled 2017-05-21: qty 30

## 2017-05-21 MED ORDER — SODIUM CHLORIDE 0.9 % IV SOLN
INTRAVENOUS | Status: AC | PRN
  Administered 2017-05-21: 10 mL/h via INTRAVENOUS

## 2017-05-21 MED ORDER — FENTANYL CITRATE (PF) 100 MCG/2ML IJ SOLN
INTRAMUSCULAR | Status: AC
  Filled 2017-05-21: qty 2

## 2017-05-21 MED ORDER — FENTANYL CITRATE (PF) 100 MCG/2ML IJ SOLN
INTRAMUSCULAR | Status: AC | PRN
  Administered 2017-05-21: 25 ug via INTRAVENOUS
  Administered 2017-05-21: 50 ug via INTRAVENOUS
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
## 2017-05-21 NOTE — Procedures (Signed)
Random renal Bx L kidney 16 g times one EBL 0 Comp 0

## 2017-05-21 NOTE — Discharge Instructions (Addendum)

## 2017-06-01 ENCOUNTER — Encounter (HOSPITAL_COMMUNITY): Payer: Self-pay

## 2017-06-01 IMAGING — US US BIOPSY
1 series · 6 of 6 positions shown · non-contrast
Comparison: none

INDICATION: Renal insufficiency. Chronic kidney disease stage 3. Proteinuria.
Hematuria.

[Series 1: us biopsy · 0.19mm/px · 6 of 6 slices shown]
[im 1/6]
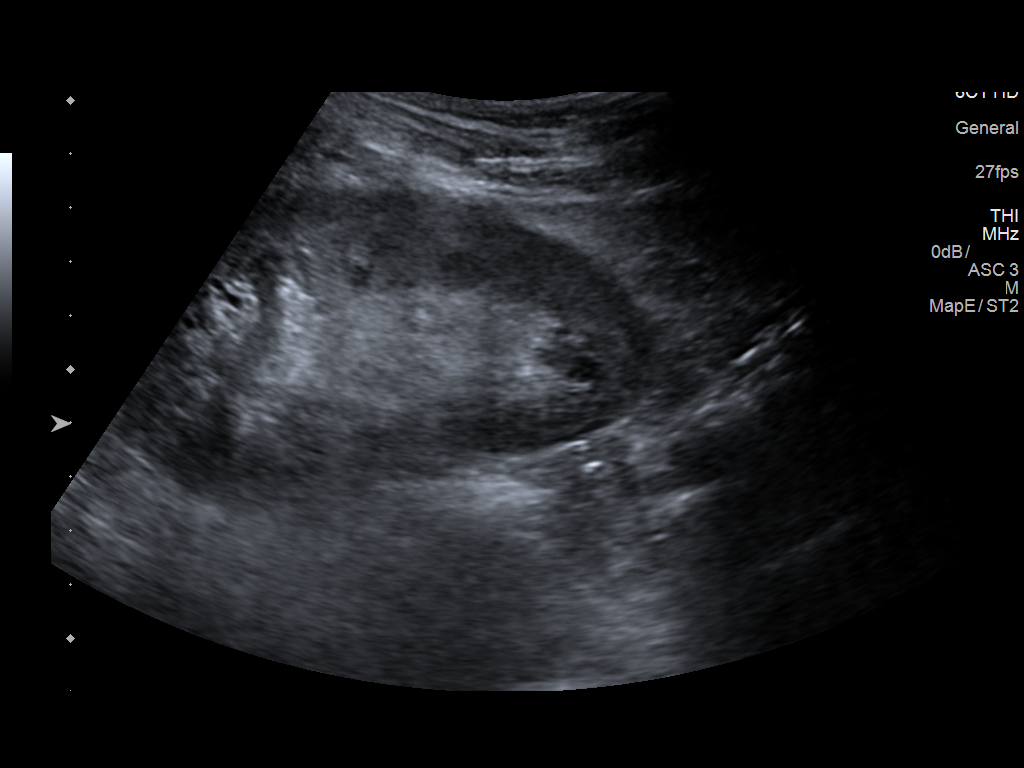
[im 2/6]
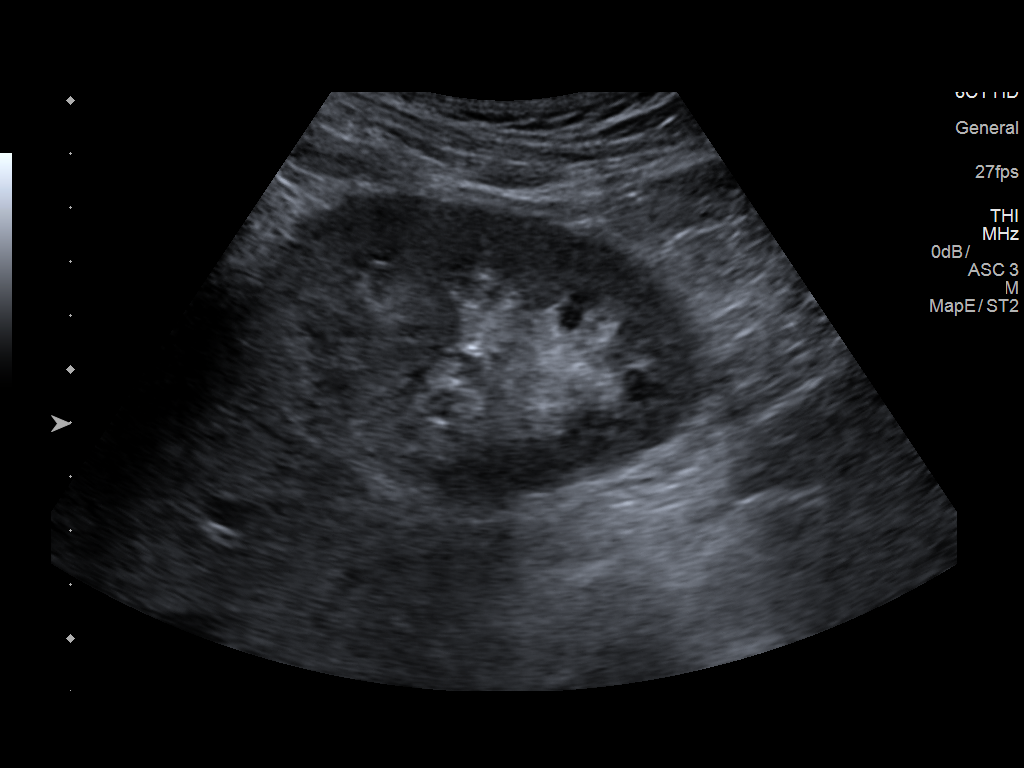
[im 3/6]
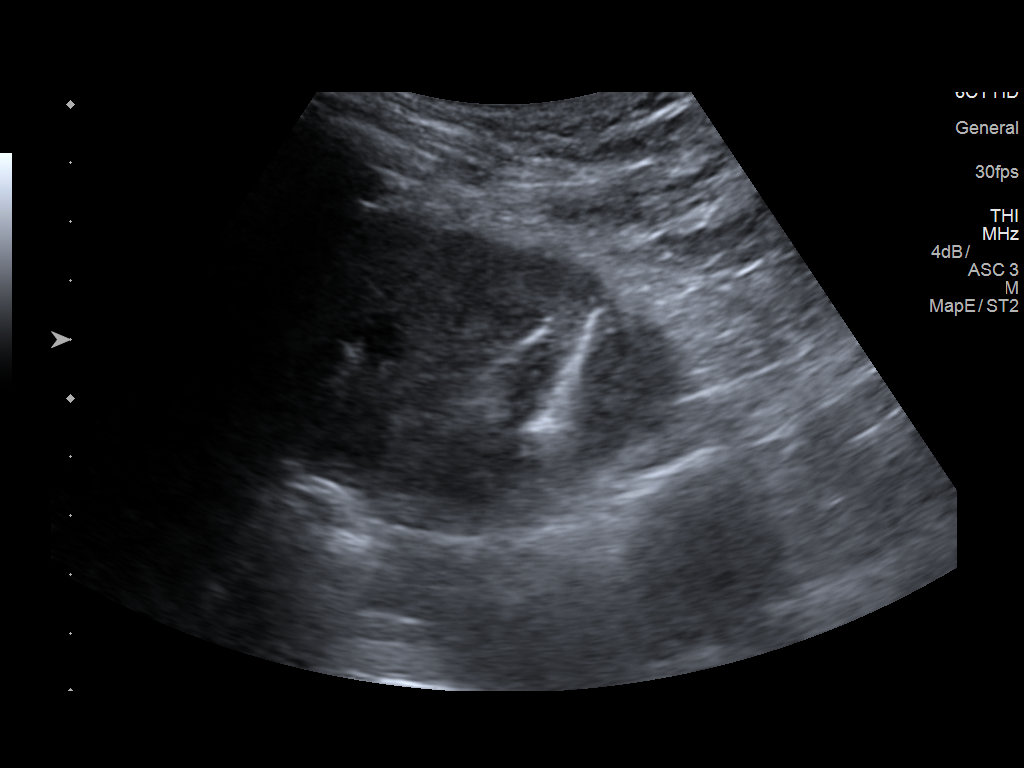
[im 4/6]
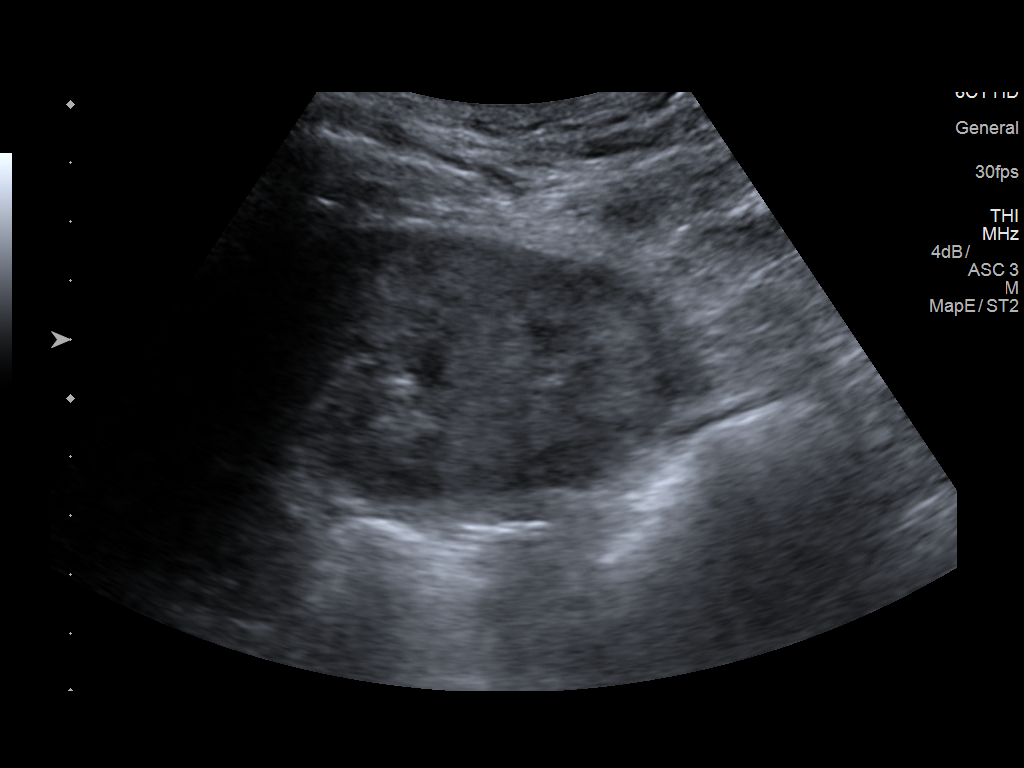
[im 5/6]
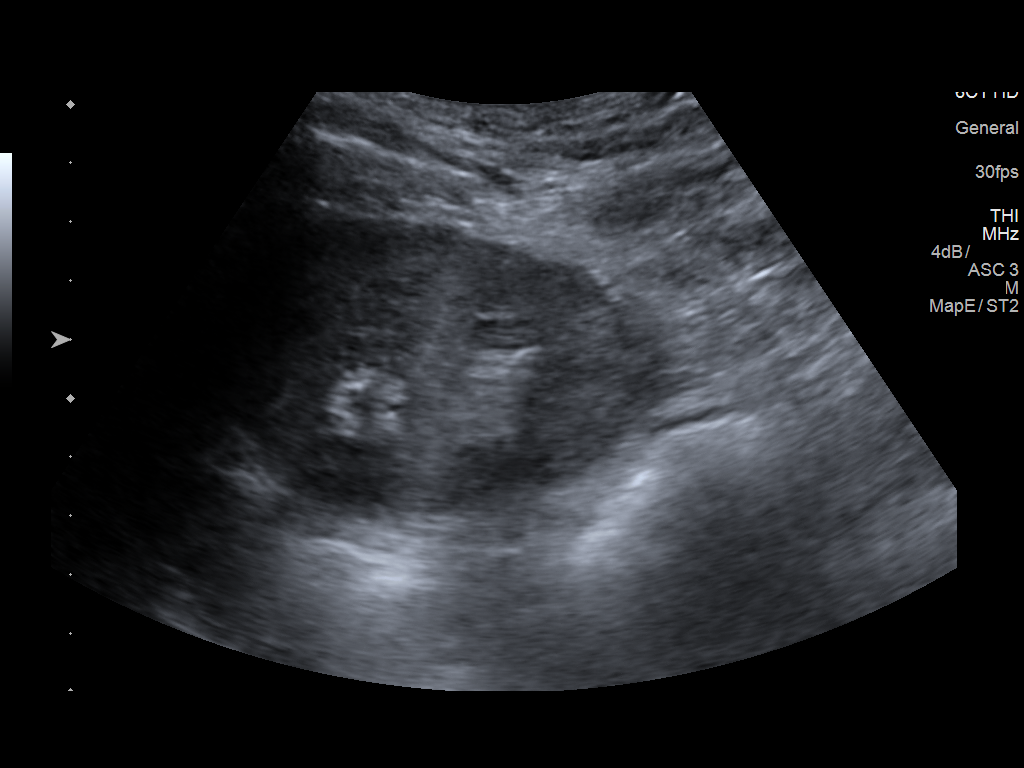
[im 6/6]
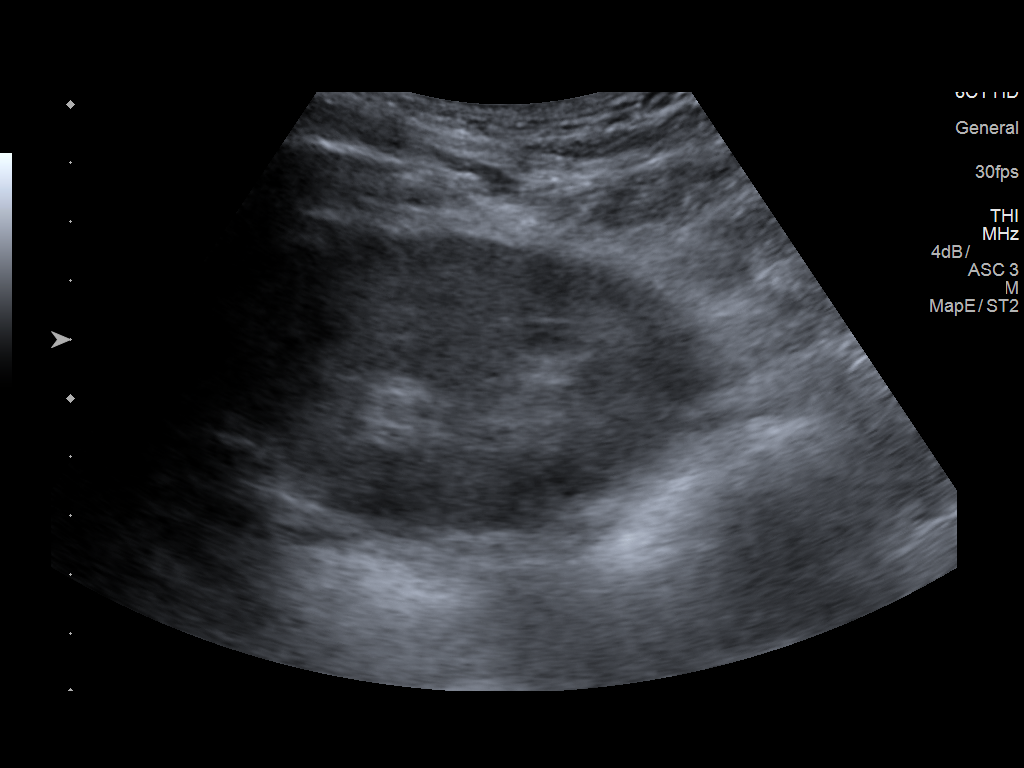

[6 of 6 positions shown; findings below may reference images not displayed]

EXAM:
ULTRASOUND-GUIDED RANDOM RENAL CORTEX CORE BIOPSY

MEDICATIONS:
None.

ANESTHESIA/SEDATION:
Fentanyl 75 mcg IV; Versed 2 mg IV

Moderate Sedation Time:  10 minutes

The patient was continuously monitored during the procedure by the
interventional radiology nurse under my direct supervision.

FLUOROSCOPY TIME:  Fluoroscopy Time:  minutes  seconds ( mGy).

COMPLICATIONS:
None immediate.

PROCEDURE:
Informed written consent was obtained from the patient after a
thorough discussion of the procedural risks, benefits and
alternatives. All questions were addressed. Maximal Sterile Barrier
Technique was utilized including caps, mask, sterile gowns, sterile
gloves, sterile drape, hand hygiene and skin antiseptic. A timeout
was performed prior to the initiation of the procedure.

The back was prepped with ChloraPrep in a sterile fashion, and a
sterile drape was applied covering the operative field. A sterile
gown and sterile gloves were used for the procedure.

Under sonographic guidance, a 16 gauge core biopsy of the left renal
lower pole cortex was obtained. Final imaging was performed.

Patient tolerated the procedure well without complication. Vital
sign monitoring by nursing staff during the procedure will continue
as patient is in the special procedures unit for post procedure
observation.
FINDINGS: The images document guide needle placement within the left lower
pole renal cortex. Post biopsy images demonstrate no hemorrhage.
IMPRESSION: Successful ultrasound-guided random renal core cortex biopsy.

## 2017-06-11 ENCOUNTER — Encounter (HOSPITAL_COMMUNITY): Payer: Self-pay

## 2017-06-11 ENCOUNTER — Other Ambulatory Visit: Payer: Self-pay | Admitting: Cardiovascular Disease

## 2017-06-11 NOTE — Telephone Encounter (Signed)
REFILL

## 2017-06-12 ENCOUNTER — Other Ambulatory Visit: Payer: Self-pay | Admitting: *Deleted

## 2017-06-12 MED ORDER — NITROGLYCERIN 2 % TD OINT: TOPICAL_OINTMENT | TRANSDERMAL | 2 refills | Status: DC

## 2017-06-12 NOTE — Telephone Encounter (Signed)
Refill request received via fax.   Last Visit: 03/23/17 Next Visit due May 2019. Message sent to front to schedule patient.   Okay to refill per Dr. Estanislado Pandy

## 2017-06-18 DIAGNOSIS — R809 Proteinuria, unspecified: Secondary | ICD-10-CM | POA: Diagnosis not present

## 2017-06-18 DIAGNOSIS — N183 Chronic kidney disease, stage 3 (moderate): Secondary | ICD-10-CM | POA: Diagnosis not present

## 2017-06-18 DIAGNOSIS — I129 Hypertensive chronic kidney disease with stage 1 through stage 4 chronic kidney disease, or unspecified chronic kidney disease: Secondary | ICD-10-CM | POA: Diagnosis not present

## 2017-06-21 ENCOUNTER — Other Ambulatory Visit: Payer: Self-pay | Admitting: Rheumatology

## 2017-06-21 NOTE — Telephone Encounter (Addendum)
Last visit: 03/23/2017  Next visit: 08/22/2017 UDS: 03/26/2017  Narc agreement: 03/27/2017  Office note on 03/23/2017: "I will call in Vicodin 5/325 one tablet by mouth daily at bedtime when necessary total 30 tablets with no refills will be given after the UDS."   Okay to refill hydrocodone? Or should PCP prescribe it?

## 2017-06-21 NOTE — Telephone Encounter (Signed)
Patient's wife [name] called to request prescription refill of Hydrocodone.  Patient's pharmacy is [facility] on [address]

## 2017-06-21 NOTE — Telephone Encounter (Signed)
Okay to give her a refill of hydrocodone.  Please advise patient to get medication into half if possible and take half every other day.  It will be nice if he can taper off the medication completely.

## 2017-06-22 MED ORDER — HYDROCODONE-ACETAMINOPHEN 5-325 MG PO TABS: ORAL_TABLET | ORAL | 0 refills | Status: DC

## 2017-06-22 NOTE — Telephone Encounter (Signed)
Patient's wife [name] states [name] only takes the medication as needed with gout flares. Advised her that Dr. Estanislado Pandy would like to taper him off completely. She verbalized understanding.

## 2017-07-03 DIAGNOSIS — I129 Hypertensive chronic kidney disease with stage 1 through stage 4 chronic kidney disease, or unspecified chronic kidney disease: Secondary | ICD-10-CM | POA: Diagnosis not present

## 2017-07-03 DIAGNOSIS — N183 Chronic kidney disease, stage 3 (moderate): Secondary | ICD-10-CM | POA: Diagnosis not present

## 2017-07-03 DIAGNOSIS — N049 Nephrotic syndrome with unspecified morphologic changes: Secondary | ICD-10-CM | POA: Diagnosis not present

## 2017-07-09 ENCOUNTER — Other Ambulatory Visit: Payer: Self-pay | Admitting: Cardiovascular Disease

## 2017-07-18 DIAGNOSIS — N183 Chronic kidney disease, stage 3 (moderate): Secondary | ICD-10-CM | POA: Diagnosis not present

## 2017-07-18 DIAGNOSIS — I129 Hypertensive chronic kidney disease with stage 1 through stage 4 chronic kidney disease, or unspecified chronic kidney disease: Secondary | ICD-10-CM | POA: Diagnosis not present

## 2017-07-29 ENCOUNTER — Other Ambulatory Visit: Payer: Self-pay | Admitting: Cardiovascular Disease

## 2017-07-31 DIAGNOSIS — N183 Chronic kidney disease, stage 3 (moderate): Secondary | ICD-10-CM | POA: Diagnosis not present

## 2017-07-31 DIAGNOSIS — R809 Proteinuria, unspecified: Secondary | ICD-10-CM | POA: Diagnosis not present

## 2017-08-03 DIAGNOSIS — R809 Proteinuria, unspecified: Secondary | ICD-10-CM | POA: Diagnosis not present

## 2017-08-03 DIAGNOSIS — R3129 Other microscopic hematuria: Secondary | ICD-10-CM | POA: Diagnosis not present

## 2017-08-03 DIAGNOSIS — N183 Chronic kidney disease, stage 3 (moderate): Secondary | ICD-10-CM | POA: Diagnosis not present
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
## 2017-08-10 ENCOUNTER — Other Ambulatory Visit: Payer: Self-pay | Admitting: Cardiovascular Disease

## 2017-08-10 NOTE — Telephone Encounter (Signed)
Rx request sent to pharmacy.

## 2017-08-14 ENCOUNTER — Encounter: Payer: Self-pay | Admitting: Rheumatology
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
## 2017-08-22 ENCOUNTER — Ambulatory Visit: Payer: Commercial Managed Care - HMO | Admitting: Rheumatology

## 2017-08-31 ENCOUNTER — Encounter: Payer: Self-pay | Admitting: Physician Assistant

## 2017-08-31 ENCOUNTER — Ambulatory Visit: Payer: 59 | Admitting: Rheumatology

## 2017-08-31 VITALS — BP 144/87 | HR 73 | Resp 14 | Ht 69.5 in | Wt 117.0 lb

## 2017-08-31 DIAGNOSIS — M1A09X0 Idiopathic chronic gout, multiple sites, without tophus (tophi): Secondary | ICD-10-CM | POA: Diagnosis not present

## 2017-08-31 DIAGNOSIS — Z8709 Personal history of other diseases of the respiratory system: Secondary | ICD-10-CM

## 2017-08-31 DIAGNOSIS — Z87448 Personal history of other diseases of urinary system: Secondary | ICD-10-CM | POA: Diagnosis not present

## 2017-08-31 DIAGNOSIS — L80 Vitiligo: Secondary | ICD-10-CM

## 2017-08-31 DIAGNOSIS — Z8679 Personal history of other diseases of the circulatory system: Secondary | ICD-10-CM

## 2017-08-31 DIAGNOSIS — M349 Systemic sclerosis, unspecified: Secondary | ICD-10-CM

## 2017-08-31 DIAGNOSIS — I7300 Raynaud's syndrome without gangrene: Secondary | ICD-10-CM

## 2017-08-31 MED ORDER — ALLOPURINOL 100 MG PO TABS: 100.0000 mg | ORAL_TABLET | Freq: Every day | ORAL | 2 refills | Status: DC

## 2017-09-01 LAB — URIC ACID: Uric Acid, Serum: 9.1 mg/dL — ABNORMAL HIGH (ref 4.0–8.0)

## 2017-09-03 NOTE — Progress Notes (Signed)
Uric acid is 9.1.  Please advise patient to continue taking Allopurinol 100 mg by mouth once daily.  Please advise him to notify us if he has a flare.

## 2017-09-15 ENCOUNTER — Other Ambulatory Visit: Payer: Self-pay | Admitting: Cardiovascular Disease

## 2017-09-18 NOTE — Telephone Encounter (Signed)
Rx(s) sent to pharmacy electronically.

## 2017-09-20 DIAGNOSIS — E46 Unspecified protein-calorie malnutrition: Secondary | ICD-10-CM | POA: Diagnosis not present

## 2017-09-20 DIAGNOSIS — R809 Proteinuria, unspecified: Secondary | ICD-10-CM | POA: Diagnosis not present

## 2017-09-20 DIAGNOSIS — N049 Nephrotic syndrome with unspecified morphologic changes: Secondary | ICD-10-CM | POA: Diagnosis not present

## 2017-09-20 DIAGNOSIS — N183 Chronic kidney disease, stage 3 (moderate): Secondary | ICD-10-CM | POA: Diagnosis not present

## 2017-09-21 ENCOUNTER — Telehealth: Payer: Self-pay | Admitting: Rheumatology

## 2017-09-21 DIAGNOSIS — N183 Chronic kidney disease, stage 3 (moderate): Secondary | ICD-10-CM | POA: Diagnosis not present

## 2017-09-21 DIAGNOSIS — I10 Essential (primary) hypertension: Secondary | ICD-10-CM | POA: Diagnosis not present

## 2017-09-21 DIAGNOSIS — R809 Proteinuria, unspecified: Secondary | ICD-10-CM | POA: Diagnosis not present

## 2017-09-21 NOTE — Telephone Encounter (Signed)
I received a phone call from patient's nephrologist Dr. Joelyn Oms.  He mentioned that patient's renal function deteriorated but he is monitoring them closely.  He took him off amlodipine because of his renal function.  As his Raynauds is getting worse he will start him on diltiazem. Bo Merino, MD

## 2017-09-26 ENCOUNTER — Telehealth: Payer: Self-pay | Admitting: Cardiovascular Disease

## 2017-09-26 NOTE — Telephone Encounter (Signed)
New message    Pt c/o medication issue:  1. Name of Medication: hydrochlorothiazide (HYDRODIURIL) 25 MG tablet  2. How are you currently taking this medication (dosage and times per day)?   3. Are you having a reaction (difficulty breathing--STAT)? no  4. What is your medication issue? Spouse calling to confirm if patient should still be taking this med

## 2017-09-26 NOTE — Telephone Encounter (Signed)
Returned call to wife (ok per DPR)-advised per last OV note and med list he was taking HCTZ 25 mg daily.  Advised unless this has been changed by another provider then patient should be taking this.   Aware and verbalized understanding.

## 2017-10-11 DIAGNOSIS — N183 Chronic kidney disease, stage 3 (moderate): Secondary | ICD-10-CM | POA: Diagnosis not present

## 2017-10-11 DIAGNOSIS — R809 Proteinuria, unspecified: Secondary | ICD-10-CM | POA: Diagnosis not present

## 2017-11-14 DIAGNOSIS — I10 Essential (primary) hypertension: Secondary | ICD-10-CM | POA: Diagnosis not present

## 2017-11-14 DIAGNOSIS — N183 Chronic kidney disease, stage 3 (moderate): Secondary | ICD-10-CM | POA: Diagnosis not present

## 2017-11-14 DIAGNOSIS — R809 Proteinuria, unspecified: Secondary | ICD-10-CM | POA: Diagnosis not present

## 2017-12-28 ENCOUNTER — Other Ambulatory Visit: Payer: Self-pay | Admitting: Physician Assistant

## 2017-12-28 NOTE — Telephone Encounter (Signed)
ok

## 2017-12-28 NOTE — Telephone Encounter (Signed)
Last visit: 08/31/17 Next visit: 02/08/18 Labs: 11/14/17 Creat 1.68 GFR 51 Calcium 8  Okay to refill Allopurinol?
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
## 2018-01-15 ENCOUNTER — Encounter: Payer: Self-pay | Admitting: Physician Assistant

## 2018-01-15 ENCOUNTER — Ambulatory Visit: Payer: 59 | Admitting: Physician Assistant

## 2018-01-15 VITALS — BP 136/78 | HR 58 | Ht 68.0 in | Wt 116.0 lb

## 2018-01-15 DIAGNOSIS — I2510 Atherosclerotic heart disease of native coronary artery without angina pectoris: Secondary | ICD-10-CM | POA: Diagnosis not present

## 2018-01-15 DIAGNOSIS — R0609 Other forms of dyspnea: Secondary | ICD-10-CM | POA: Diagnosis not present

## 2018-01-15 DIAGNOSIS — I3139 Other pericardial effusion (noninflammatory): Secondary | ICD-10-CM

## 2018-01-15 DIAGNOSIS — R0602 Shortness of breath: Secondary | ICD-10-CM

## 2018-01-15 DIAGNOSIS — I313 Pericardial effusion (noninflammatory): Secondary | ICD-10-CM | POA: Diagnosis not present

## 2018-01-15 DIAGNOSIS — I10 Essential (primary) hypertension: Secondary | ICD-10-CM

## 2018-01-15 DIAGNOSIS — Z9861 Coronary angioplasty status: Secondary | ICD-10-CM

## 2018-01-15 MED ORDER — NITROGLYCERIN 0.4 MG SL SUBL: 0.4000 mg | SUBLINGUAL_TABLET | SUBLINGUAL | 3 refills | Status: DC | PRN

## 2018-01-15 NOTE — Patient Instructions (Signed)
Medication Instructions:  Your physician recommends that you continue on your current medications as directed. Please refer to the Current Medication list given to you today.  Your Nitro-glycerin has bee refilled today  Labwork: Bmet and Bnp today  Testing/Procedures: Your physician has requested that you have an echocardiogram. Echocardiography is a painless test that uses sound waves to create images of your heart. It provides your doctor with information about the size and shape of your heart and how well your heart's chambers and valves are working. This procedure takes approximately one hour. There are no restrictions for this procedure.  Your physician has requested that you have a lexiscan myoview. For further information please visit [email or web address]. Please follow instruction sheet, as given.    Follow-Up: Your physician recommends that you schedule a follow-up appointment in: 4 weeks   Any Other Special Instructions Will Be Listed Below (If Applicable).     If you need a refill on your cardiac medications before your next appointment, please call your pharmacy.

## 2018-01-16 ENCOUNTER — Telehealth: Payer: Self-pay

## 2018-01-16 LAB — BASIC METABOLIC PANEL
BUN/Creatinine Ratio: 16 (ref 9–20)
BUN: 32 mg/dL — AB (ref 6–24)
CALCIUM: 7.7 mg/dL — AB (ref 8.7–10.2)
CHLORIDE: 112 mmol/L — AB (ref 96–106)
CO2: 19 mmol/L — AB (ref 20–29)
Creatinine, Ser: 2.06 mg/dL — ABNORMAL HIGH (ref 0.76–1.27)
GFR calc non Af Amer: 34 mL/min/{1.73_m2} — ABNORMAL LOW (ref >59)
GFR, EST AFRICAN AMERICAN: 40 mL/min/{1.73_m2} — AB (ref >59)
Glucose: 78 mg/dL (ref 65–99)
Potassium: 4.3 mmol/L (ref 3.5–5.2)
Sodium: 144 mmol/L (ref 134–144)

## 2018-01-16 LAB — PRO B NATRIURETIC PEPTIDE: NT-Pro BNP: 4482 pg/mL — ABNORMAL HIGH (ref 0–210)

## 2018-01-16 NOTE — Telephone Encounter (Signed)
Attempted to contact patient, spoke with wife per DPR, she has to call back due to being at the doctor herself. Will wait to speak with her about results and medications.

## 2018-01-16 NOTE — Telephone Encounter (Signed)
-----  Message from Ledora Bottcher, [place] sent at 01/16/2018  7:57 AM EDT ----- Your lab work suggests that your heart is under strain, but this lab can also be elevated when your kidney function is reduced. Please call your nephrologist for an appt this week or next week. I will prescribe 40 mg lasix daily for now until we get the results of your echo. You will need repeat lab work in 1 week - BMP. Please return next Wed for labs. Who is your nephrologist?

## 2018-01-18 ENCOUNTER — Other Ambulatory Visit: Payer: Self-pay

## 2018-01-18 DIAGNOSIS — R809 Proteinuria, unspecified: Secondary | ICD-10-CM | POA: Diagnosis not present

## 2018-01-18 DIAGNOSIS — N183 Chronic kidney disease, stage 3 unspecified: Secondary | ICD-10-CM

## 2018-01-18 DIAGNOSIS — I10 Essential (primary) hypertension: Secondary | ICD-10-CM | POA: Diagnosis not present

## 2018-01-18 DIAGNOSIS — I509 Heart failure, unspecified: Secondary | ICD-10-CM

## 2018-01-21 ENCOUNTER — Telehealth: Payer: Self-pay | Admitting: Cardiovascular Disease

## 2018-01-21 NOTE — Telephone Encounter (Signed)
Received records from [facility] on 01/21/18, Appt on 02/15/18 @ 2:45PM. NV

## 2018-01-22 ENCOUNTER — Ambulatory Visit (HOSPITAL_COMMUNITY): Payer: 59 | Attending: Physician Assistant

## 2018-01-22 ENCOUNTER — Other Ambulatory Visit: Payer: Self-pay

## 2018-01-22 DIAGNOSIS — E785 Hyperlipidemia, unspecified: Secondary | ICD-10-CM | POA: Diagnosis not present

## 2018-01-22 DIAGNOSIS — I313 Pericardial effusion (noninflammatory): Secondary | ICD-10-CM | POA: Diagnosis not present

## 2018-01-22 DIAGNOSIS — R0609 Other forms of dyspnea: Secondary | ICD-10-CM | POA: Diagnosis not present

## 2018-01-22 DIAGNOSIS — I2510 Atherosclerotic heart disease of native coronary artery without angina pectoris: Secondary | ICD-10-CM | POA: Diagnosis not present

## 2018-01-22 DIAGNOSIS — I081 Rheumatic disorders of both mitral and tricuspid valves: Secondary | ICD-10-CM | POA: Diagnosis not present

## 2018-01-22 DIAGNOSIS — I10 Essential (primary) hypertension: Secondary | ICD-10-CM | POA: Insufficient documentation

## 2018-01-24 ENCOUNTER — Telehealth: Payer: Self-pay | Admitting: Cardiovascular Disease

## 2018-01-24 ENCOUNTER — Telehealth (HOSPITAL_COMMUNITY): Payer: Self-pay

## 2018-01-24 NOTE — Telephone Encounter (Signed)
Received records from [facility] on 01/24/18, Appt 02/15/18 @ 2:45PM. NV

## 2018-01-24 NOTE — Telephone Encounter (Signed)
Encounter complete. 

## 2018-01-25 DIAGNOSIS — N183 Chronic kidney disease, stage 3 (moderate): Secondary | ICD-10-CM | POA: Diagnosis not present

## 2018-01-29 ENCOUNTER — Ambulatory Visit (HOSPITAL_COMMUNITY)
Admission: RE | Admit: 2018-01-29 | Discharge: 2018-01-29 | Disposition: A | Discharge status: Home / Self Care | Payer: 59 | Source: Ambulatory Visit | Attending: Cardiovascular Disease | Admitting: Cardiovascular Disease

## 2018-01-29 DIAGNOSIS — R0609 Other forms of dyspnea: Secondary | ICD-10-CM | POA: Insufficient documentation

## 2018-01-29 LAB — MYOCARDIAL PERFUSION IMAGING
CHL CUP NUCLEAR SRS: 2
CSEPPHR: 88 {beats}/min
LV dias vol: 85 mL (ref 62–150)
LV sys vol: 43 mL
Rest HR: 68 {beats}/min
SDS: 0
SSS: 2
TID: 0.97

## 2018-01-29 MED ORDER — TECHNETIUM TC 99M TETROFOSMIN IV KIT
31.9000 | PACK | Freq: Once | INTRAVENOUS | Status: AC | PRN
  Administered 2018-01-29: 31.9 via INTRAVENOUS
  Filled 2018-01-29: qty 32

## 2018-01-29 MED ORDER — TECHNETIUM TC 99M TETROFOSMIN IV KIT
9.9000 | PACK | Freq: Once | INTRAVENOUS | Status: AC | PRN
  Administered 2018-01-29: 9.9 via INTRAVENOUS
  Filled 2018-01-29: qty 10

## 2018-01-29 MED ORDER — REGADENOSON 0.4 MG/5ML IV SOLN
0.4000 mg | Freq: Once | INTRAVENOUS | Status: AC
  Administered 2018-01-29: 0.4 mg via INTRAVENOUS
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
## 2018-01-31 ENCOUNTER — Encounter: Payer: Self-pay | Admitting: Cardiovascular Disease

## 2018-02-08 ENCOUNTER — Ambulatory Visit: Payer: 59 | Admitting: Physician Assistant

## 2018-02-11 ENCOUNTER — Encounter: Payer: Self-pay | Admitting: Physician Assistant

## 2018-02-11 ENCOUNTER — Ambulatory Visit: Payer: 59 | Admitting: Rheumatology

## 2018-02-11 VITALS — BP 137/81 | HR 67 | Resp 14 | Ht 68.5 in | Wt 122.2 lb

## 2018-02-11 DIAGNOSIS — I7300 Raynaud's syndrome without gangrene: Secondary | ICD-10-CM | POA: Diagnosis not present

## 2018-02-11 DIAGNOSIS — M1A09X0 Idiopathic chronic gout, multiple sites, without tophus (tophi): Secondary | ICD-10-CM | POA: Diagnosis not present

## 2018-02-11 DIAGNOSIS — R3121 Asymptomatic microscopic hematuria: Secondary | ICD-10-CM

## 2018-02-11 DIAGNOSIS — F17200 Nicotine dependence, unspecified, uncomplicated: Secondary | ICD-10-CM

## 2018-02-11 DIAGNOSIS — M349 Systemic sclerosis, unspecified: Secondary | ICD-10-CM | POA: Diagnosis not present

## 2018-02-11 DIAGNOSIS — Z8679 Personal history of other diseases of the circulatory system: Secondary | ICD-10-CM

## 2018-02-11 DIAGNOSIS — R809 Proteinuria, unspecified: Secondary | ICD-10-CM

## 2018-02-11 DIAGNOSIS — Z8709 Personal history of other diseases of the respiratory system: Secondary | ICD-10-CM

## 2018-02-11 DIAGNOSIS — L80 Vitiligo: Secondary | ICD-10-CM

## 2018-02-11 DIAGNOSIS — Z87448 Personal history of other diseases of urinary system: Secondary | ICD-10-CM | POA: Diagnosis not present

## 2018-02-11 DIAGNOSIS — E559 Vitamin D deficiency, unspecified: Secondary | ICD-10-CM

## 2018-02-12 ENCOUNTER — Encounter: Payer: Self-pay | Admitting: Cardiovascular Disease

## 2018-02-12 ENCOUNTER — Ambulatory Visit: Payer: 59 | Admitting: Cardiovascular Disease

## 2018-02-12 DIAGNOSIS — I313 Pericardial effusion (noninflammatory): Secondary | ICD-10-CM | POA: Diagnosis not present

## 2018-02-12 DIAGNOSIS — I2510 Atherosclerotic heart disease of native coronary artery without angina pectoris: Secondary | ICD-10-CM | POA: Diagnosis not present

## 2018-02-12 DIAGNOSIS — Z9861 Coronary angioplasty status: Secondary | ICD-10-CM

## 2018-02-12 DIAGNOSIS — E7800 Pure hypercholesterolemia, unspecified: Secondary | ICD-10-CM | POA: Diagnosis not present

## 2018-02-12 DIAGNOSIS — I10 Essential (primary) hypertension: Secondary | ICD-10-CM | POA: Diagnosis not present

## 2018-02-12 DIAGNOSIS — I3139 Other pericardial effusion (noninflammatory): Secondary | ICD-10-CM

## 2018-02-12 NOTE — Assessment & Plan Note (Signed)
History of hyperlipidemia on statin therapy with lipid profile performed 07/03/2017 revealing total cholesterol 133, LDL 99 and HDL 41.

## 2018-02-12 NOTE — Assessment & Plan Note (Signed)
History of pericardial effusion status post subxiphoid window performed by Dr. [name] back in 2010 with removal of 200 cc of pericardial fluid.  He continues to have a moderate circumferential pericardial effusion unchanged from past echoes.

## 2018-02-12 NOTE — Assessment & Plan Note (Signed)
History of essential hypertension her blood pressure measured today 140/72.  He is on Bystolic and lisinopril.

## 2018-02-12 NOTE — Progress Notes (Signed)
02/12/2018 [name]   09/14/1960  [id number]  Primary Physician [name], MD Primary Cardiologist: Lorretta Harp MD [name], [place]  HPI:  [name] is a 57 y.o.  [name] is a 57 y.o.  thin-appearing, African American male, father of 26, grandfather to 2 grandchildren who I last saw  04/13/2017. He is accompanied by his wife today.[name] He has a history of idiopathic pericardial effusion as well as scleroderma, hypertension, hyperlipidemia with low HDL. I catheterized him December 24, 2008, revealing a tight distal RCA stenosis which I ultimately stented with a bare-metal stent on January 07, 2009. He continues to smoke a pack a day. Dr. Merilynn Finland performed a subxiphoid window removing 200 mL of pericardial fluid which resulted in clinical improvement at that time. Followup echo performed November 2011 showed no effusion with normal EF. His other problems include hypertension and hyperlipidemia. He is otherwise asymptomatic and denied any chest pain or shortness of breath. The patient admits to not taking his simvastatin. His last Myoview stress test performed 05/14/12 was nonischemic. He denies chest painbut does have some dyspnea on exertion probably related to COPD.He had a recent chest x-ray performed because of chronic cough suspected bronchitis that was read as "consistent with congestive heart failure. His last 2-D echo 02/24/16 revealed normal LV systolic function, grade 2 diastolic dysfunction with moderate pericardial effusion without evidence of pericardial tamponade. Since I saw him a year ago he's remained stable.  He did see Cody Duke PA-C in the office 01/15/2018 because of increasing shortness of breath.  Did have some mild peripheral edema.  2D echo showed normal LV systolic function with small nonhemodynamically significant pericardial effusion unchanged from last echo.  Myoview stress test was normal as well.  His  hydrochlorthiazide was changed to torsemide.  His edema is improved as has his shortness of breath.  No outpatient medications have been marked as taking for the 02/12/18 encounter (Office Visit) with Lorretta Harp, MD.     No Known Allergies  Social History   Socioeconomic History  . Marital status: Married    Spouse name: [name]  . Number of children: 6  . Years of education: 51  . Highest education level: Not on file  Occupational History  . Occupation: Development worker, community  Social Needs  . Financial resource strain: Not on file  . Food insecurity:    Worry: Not on file    Inability: Not on file  . Transportation needs:    Medical: Not on file    Non-medical: Not on file  Tobacco Use  . Smoking status: Current Every Day Smoker    Packs/day: 1.00    Years: 38.00    Pack years: 38.00    Types: Cigarettes    Start date: 10/21/1971  . Smokeless tobacco: Never Used  Substance and Sexual Activity  . Alcohol use: Yes    Alcohol/week: 4.0 - 5.0 standard drinks    Types: 4 - 5 Standard drinks or equivalent per week  . Drug use: No  . Sexual activity: Yes    Partners: Female  Lifestyle  . Physical activity:    Days per week: Not on file    Minutes per session: Not on file  . Stress: Not on file  Relationships  . Social connections:    Talks on phone: Not on file    Gets together: Not on file    Attends religious service: Not on file  Active member of club or organization: Not on file    Attends meetings of clubs or organizations: Not on file    Relationship status: Not on file  . Intimate partner violence:    Fear of current or ex partner: Not on file    Emotionally abused: Not on file    Physically abused: Not on file    Forced sexual activity: Not on file  Other Topics Concern  . Not on file  Social History Narrative   Lives with his wife and one daughter.     Review of Systems: General: negative for chills, fever, night sweats or weight changes.    Cardiovascular: negative for chest pain, dyspnea on exertion, edema, orthopnea, palpitations, paroxysmal nocturnal dyspnea or shortness of breath Dermatological: negative for rash Respiratory: negative for cough or wheezing Urologic: negative for hematuria Abdominal: negative for nausea, vomiting, diarrhea, bright red blood per rectum, melena, or hematemesis Neurologic: negative for visual changes, syncope, or dizziness All other systems reviewed and are otherwise negative except as noted above.    Blood pressure 140/72, pulse 72, height 5' 8.5" (1.74 m).  General appearance: alert and no distress Neck: no adenopathy, no carotid bruit, no JVD, supple, symmetrical, trachea midline and thyroid not enlarged, symmetric, no tenderness/mass/nodules Lungs: clear to auscultation bilaterally Heart: regular rate and rhythm, S1, S2 normal, no murmur, click, rub or gallop Extremities: extremities normal, atraumatic, no cyanosis or edema Pulses: 2+ and symmetric Skin: Skin color, texture, turgor normal. No rashes or lesions Neurologic: Alert and oriented X 3, normal strength and tone. Normal symmetric reflexes. Normal coordination and gait  EKG not performed today  ASSESSMENT AND PLAN:   CAD S/P percutaneous coronary angioplasty History of CAD status post RCA stenting by myself 12/24/2008 with a bare-metal stent of his distal RCA.  Because of recent shortness of breath Myoview stress test was performed that was entirely normal.  His symptoms have resolved  Hyperlipidemia History of hyperlipidemia on statin therapy with lipid profile performed 07/03/2017 revealing total cholesterol 133, LDL 99 and HDL 41.  Pericardial effusion History of pericardial effusion status post subxiphoid window performed by Dr. [name] back in 2010 with removal of 200 cc of pericardial fluid.  He continues to have a moderate circumferential pericardial effusion unchanged from past echoes.  Essential  hypertension History of essential hypertension her blood pressure measured today 140/72.  He is on Bystolic and lisinopril.  Acute CHF (congestive heart failure) (HCC) History of diastolic heart failure on spironolactone and torsemide which was recently added in place of hydrochlorothiazide because of mild peripheral edema which has improved.  He also has moderate renal insufficiency.      Lorretta Harp MD FACP,FACC,FAHA, [facility] 02/12/2018 3:10 PM

## 2018-02-12 NOTE — Assessment & Plan Note (Signed)
History of diastolic heart failure on spironolactone and torsemide which was recently added in place of hydrochlorothiazide because of mild peripheral edema which has improved.  He also has moderate renal insufficiency.

## 2018-02-12 NOTE — Assessment & Plan Note (Signed)
History of CAD status post RCA stenting by myself 12/24/2008 with a bare-metal stent of his distal RCA.  Because of recent shortness of breath Myoview stress test was performed that was entirely normal.  His symptoms have resolved

## 2018-02-12 NOTE — Patient Instructions (Signed)
Medication Instructions:  Your physician recommends that you continue on your current medications as directed. Please refer to the Current Medication list given to you today. If you need a refill on your cardiac medications before your next appointment, please call your pharmacy.   Lab work: none If you have labs (blood work) drawn today and your tests are completely normal, you will receive your results only by: [name] MyChart Message (if you have MyChart) OR . A paper copy in the mail If you have any lab test that is abnormal or we need to change your treatment, we will call you to review the results.  Testing/Procedures: none  Follow-Up: At [facility], you and your health needs are our priority.  As part of our continuing mission to provide you with exceptional heart care, we have created designated Provider Care Teams.  These Care Teams include your primary Cardiologist (physician) and Advanced Practice Providers (APPs -  Physician Assistants and Nurse Practitioners) who all work together to provide you with the care you need, when you need it. We request that you follow-up in: 6 months with Cody Adas, PA and in 12 months with [name]. Please call our office in 2 months before to schedule appointment.  Any Other Special Instructions Will Be Listed Below (If Applicable).

## 2018-02-15 ENCOUNTER — Ambulatory Visit: Payer: 59 | Admitting: Cardiovascular Disease

## 2018-02-22 DIAGNOSIS — Z0000 Encounter for general adult medical examination without abnormal findings: Secondary | ICD-10-CM | POA: Diagnosis not present

## 2018-02-22 DIAGNOSIS — R82998 Other abnormal findings in urine: Secondary | ICD-10-CM | POA: Diagnosis not present

## 2018-02-22 DIAGNOSIS — E038 Other specified hypothyroidism: Secondary | ICD-10-CM | POA: Diagnosis not present

## 2018-03-01 DIAGNOSIS — Z0000 Encounter for general adult medical examination without abnormal findings: Secondary | ICD-10-CM | POA: Diagnosis not present

## 2018-03-01 DIAGNOSIS — N183 Chronic kidney disease, stage 3 (moderate): Secondary | ICD-10-CM | POA: Diagnosis not present

## 2018-03-01 DIAGNOSIS — I10 Essential (primary) hypertension: Secondary | ICD-10-CM | POA: Diagnosis not present

## 2018-03-01 DIAGNOSIS — Z1389 Encounter for screening for other disorder: Secondary | ICD-10-CM | POA: Diagnosis not present

## 2018-03-01 DIAGNOSIS — Z23 Encounter for immunization: Secondary | ICD-10-CM | POA: Diagnosis not present

## 2018-03-01 DIAGNOSIS — E7849 Other hyperlipidemia: Secondary | ICD-10-CM | POA: Diagnosis not present

## 2018-03-10 ENCOUNTER — Other Ambulatory Visit: Payer: Self-pay | Admitting: Cardiovascular Disease

## 2018-03-11 ENCOUNTER — Other Ambulatory Visit: Payer: Self-pay | Admitting: Cardiovascular Disease

## 2018-03-29 ENCOUNTER — Encounter: Payer: Self-pay | Admitting: Cardiovascular Disease

## 2018-03-29 DIAGNOSIS — N183 Chronic kidney disease, stage 3 (moderate): Secondary | ICD-10-CM | POA: Diagnosis not present

## 2018-04-01 DIAGNOSIS — I10 Essential (primary) hypertension: Secondary | ICD-10-CM | POA: Diagnosis not present

## 2018-04-01 DIAGNOSIS — R809 Proteinuria, unspecified: Secondary | ICD-10-CM | POA: Diagnosis not present

## 2018-04-01 DIAGNOSIS — N183 Chronic kidney disease, stage 3 (moderate): Secondary | ICD-10-CM | POA: Diagnosis not present

## 2018-04-06 ENCOUNTER — Other Ambulatory Visit: Payer: Self-pay | Admitting: Rheumatology

## 2018-04-07 ENCOUNTER — Other Ambulatory Visit: Payer: Self-pay | Admitting: Cardiovascular Disease

## 2018-04-08 NOTE — Telephone Encounter (Signed)
Last Visit: 02/11/18 Next visit: 08/09/18 Labs: 11/14/17 Creat 1.68 GFR 51   Okay to refill per Dr. Estanislado Pandy

## 2018-04-08 NOTE — Telephone Encounter (Signed)
Rx request sent to pharmacy.

## 2018-05-02 ENCOUNTER — Other Ambulatory Visit: Payer: Self-pay | Admitting: Cardiovascular Disease

## 2018-05-05 ENCOUNTER — Other Ambulatory Visit: Payer: Self-pay | Admitting: Cardiovascular Disease

## 2018-05-06 ENCOUNTER — Other Ambulatory Visit: Payer: Self-pay | Admitting: Cardiovascular Disease

## 2018-06-01 ENCOUNTER — Other Ambulatory Visit: Payer: Self-pay | Admitting: Cardiovascular Disease

## 2018-06-05 ENCOUNTER — Telehealth: Payer: Self-pay | Admitting: Cardiovascular Disease

## 2018-06-05 NOTE — Telephone Encounter (Signed)
[id number]

## 2018-06-05 NOTE — Telephone Encounter (Signed)
F/U Message          Patient returned your call

## 2018-06-05 NOTE — Telephone Encounter (Signed)
Pt c/o swelling: STAT is pt has developed SOB within 24 hours  1) How much weight have you gained and in what time span? no  2) If swelling, where is the swelling located?lower legs, ankles, feet  3) Are you currently taking a fluid pill? yes  4) Are you currently SOB? no  5) Do you have a log of your daily weights (if so, list)? no  6) Have you gained 3 pounds in a day or 5 pounds in a week? no  7) Have you traveled recently? No  Patient is having swelling in his lower legs by the end of the day. 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 Pt is concerned and would like advice.

## 2018-06-05 NOTE — Telephone Encounter (Signed)
RETURNED [facility] she states that pt has been coughing for 2 months and she has tried a few different kinds of cough medicine and they are not helping. She states tha pt's socks are leaving an imprint and this has never happened before.She states that she does not know the names of the medicines that he is taking but states that she is sure that he is taking his medications as rx'D. She does not weigh daily. She will have pt start to track at least until appt and bring to scheduled appt on 06-11-2018. She would like to know if MD can adjust medication before appt to get rid of the swelling, Please advise

## 2018-06-06 NOTE — Telephone Encounter (Signed)
The patient will need an office visit with a midlevel provider next week.

## 2018-06-06 NOTE — Telephone Encounter (Signed)
Spoke with pt wife, aware of dr berry's recommendations.
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
## 2018-06-11 ENCOUNTER — Ambulatory Visit: Payer: 59 | Admitting: Adult Health

## 2018-06-11 ENCOUNTER — Ambulatory Visit
Admission: RE | Admit: 2018-06-11 | Discharge: 2018-06-11 | Disposition: A | Discharge status: Home / Self Care | Payer: 59 | Source: Ambulatory Visit | Attending: Adult Health | Admitting: Adult Health

## 2018-06-11 ENCOUNTER — Encounter: Payer: Self-pay | Admitting: Adult Health

## 2018-06-11 VITALS — BP 154/82 | HR 60 | Ht 68.0 in | Wt 124.8 lb

## 2018-06-11 DIAGNOSIS — I313 Pericardial effusion (noninflammatory): Secondary | ICD-10-CM

## 2018-06-11 DIAGNOSIS — I2510 Atherosclerotic heart disease of native coronary artery without angina pectoris: Secondary | ICD-10-CM

## 2018-06-11 DIAGNOSIS — I3139 Other pericardial effusion (noninflammatory): Secondary | ICD-10-CM

## 2018-06-11 DIAGNOSIS — Z79899 Other long term (current) drug therapy: Secondary | ICD-10-CM

## 2018-06-11 DIAGNOSIS — R05 Cough: Secondary | ICD-10-CM | POA: Diagnosis not present

## 2018-06-11 DIAGNOSIS — R0602 Shortness of breath: Secondary | ICD-10-CM

## 2018-06-11 DIAGNOSIS — I10 Essential (primary) hypertension: Secondary | ICD-10-CM

## 2018-06-11 IMAGING — CR DG CHEST 2V
2 series · 2 of 2 positions shown · non-contrast
Comparison: [DATE]

CLINICAL DATA: Shortness of breath and cough

EXAM:
CHEST - 2 VIEW

[w chest pa]
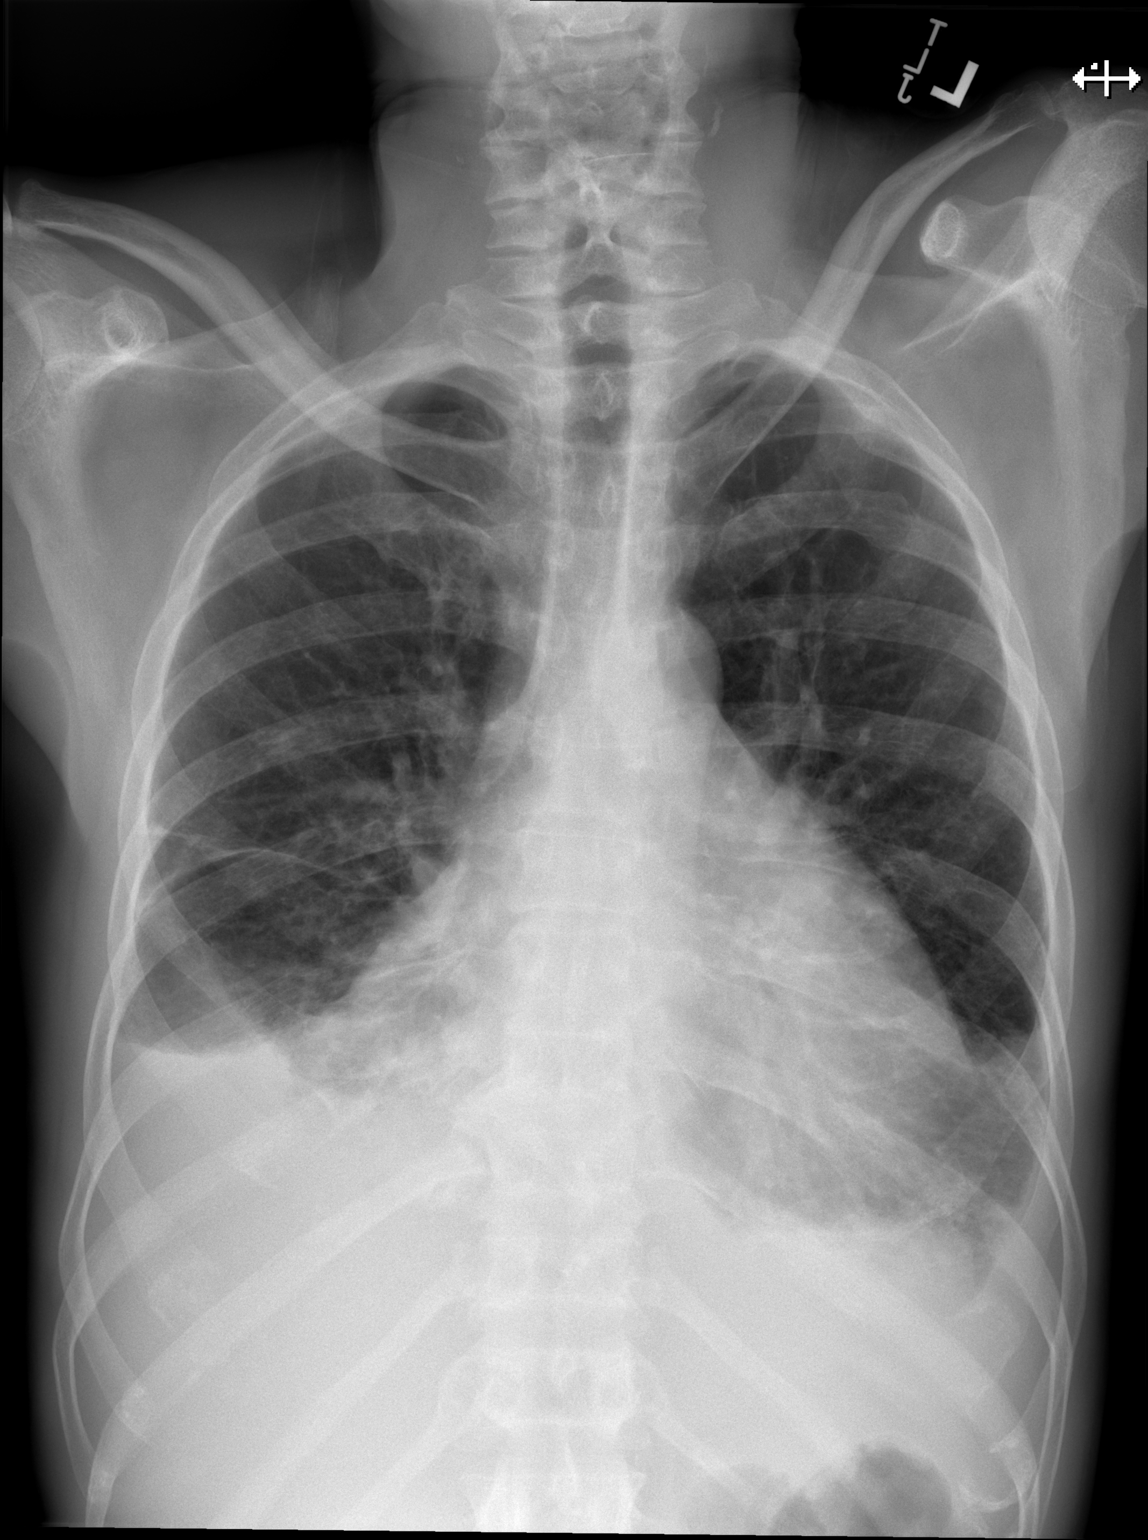

[w chest lat]
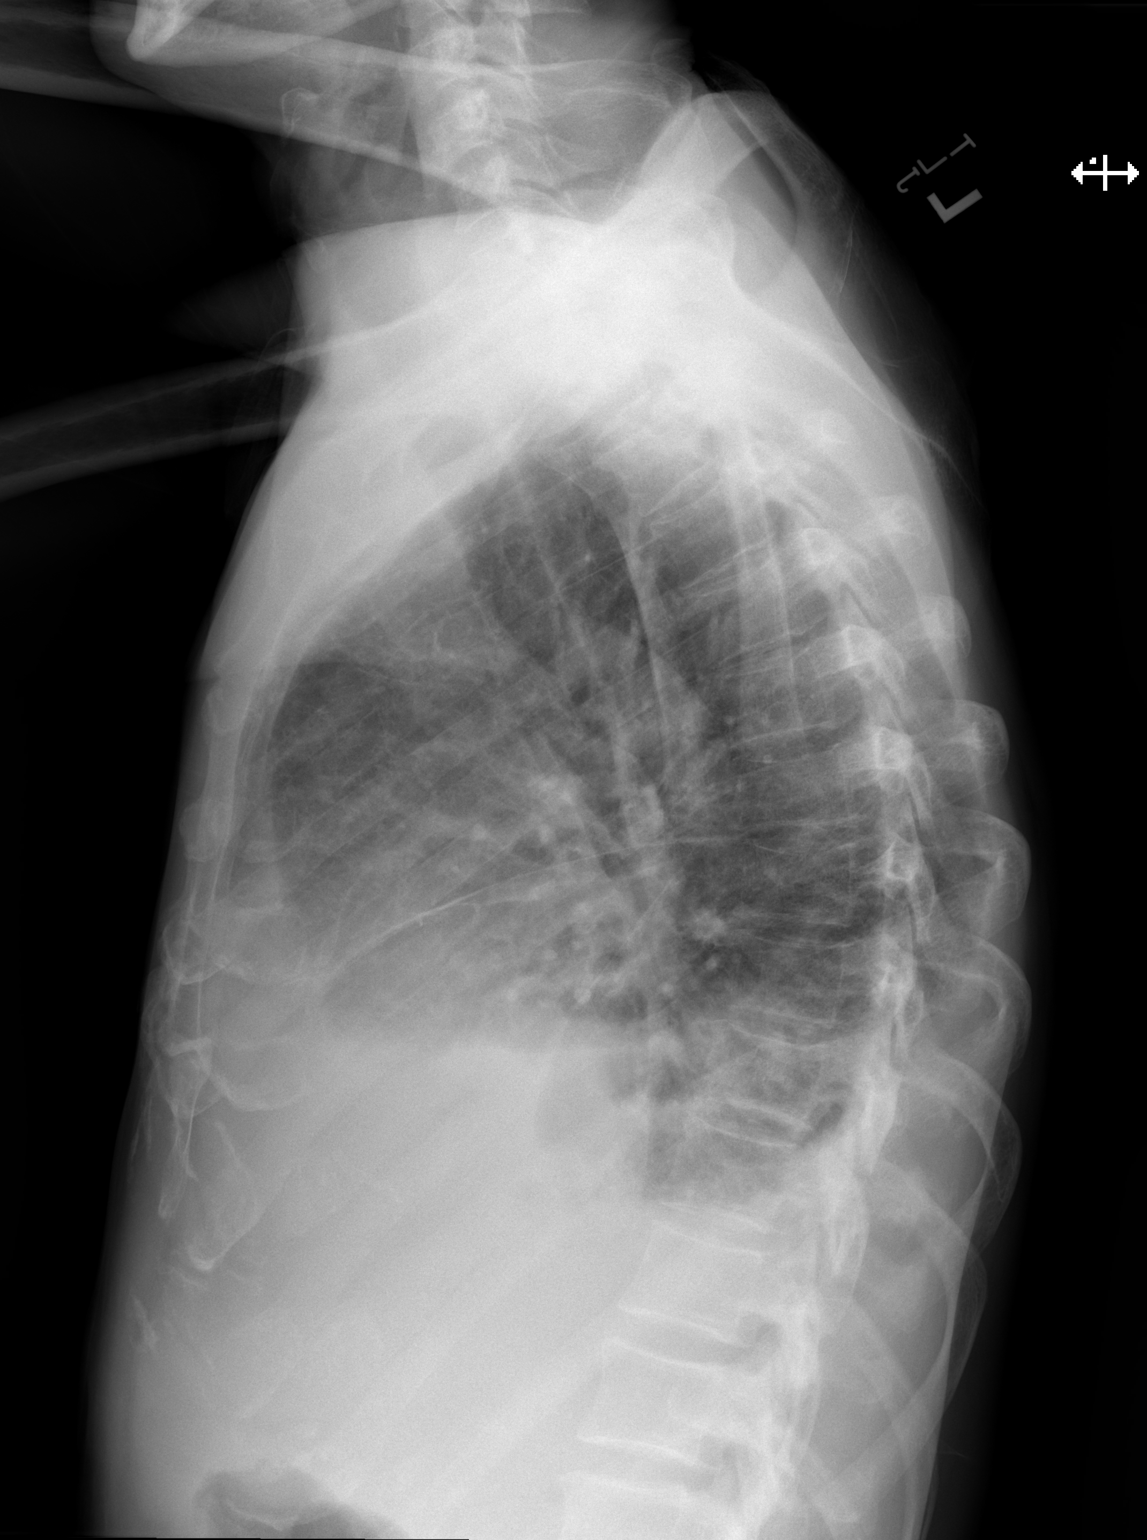

[2 of 2 positions shown; findings below may reference images not displayed]

FINDINGS: There are small pleural effusions bilaterally. There is mild
atelectatic change in each lower lobe. No consolidation evident.
There is cardiomegaly with pulmonary vascularity normal. No
adenopathy. No bone lesions.
IMPRESSION: Cardiomegaly with bilateral pleural effusions. No frank edema or
consolidation. Lower lobe atelectatic change noted bilaterally.

## 2018-06-11 NOTE — Patient Instructions (Signed)
Medication Instructions:  NO CHANGES- Your physician recommends that you continue on your current medications as directed. Please refer to the Current Medication list given to you today.  If you need a refill on your cardiac medications before your next appointment, please call your pharmacy.  Labwork: BNP, BMET AND CBC TODAY HERE IN OUR OFFICE AT [facility]   Take the provided lab slips with you to the lab for your blood draw.   When you have your labs (blood work) drawn today and your tests are completely normal, you will receive your results only by MyChart Message (if you have MyChart) -OR-  A paper copy in the mail.  If you have any lab test that is abnormal or we need to change your treatment, we will call you to review these results.  Testing/Procedures: Echocardiogram - Your physician has requested that you have an echocardiogram. Echocardiography is a painless test that uses sound waves to create images of your heart. It provides your doctor with information about the size and shape of your heart and how well your heart's chambers and valves are working. This procedure takes approximately one hour. There are no restrictions for this procedure. This will be performed at our [facility] location - [address].  A chest x-ray takes a picture of the organs and structures inside the chest, including the heart, lungs, and blood vessels. This test can show several things, including, whether the heart is enlarges; whether fluid is building up in the lungs; and whether pacemaker / defibrillator leads are still in place.  Special Instructions: MAKE SURE TO FOLLOW UP WITH PCP FOR COUGH  Follow-Up: You will need a follow up appointment in 1 months.  You may see [name], MD, Jory Sims, DNP, AACC   or one of the following Advanced Practice Providers on your designated Care Team:  Kerin Ransom, PA-C  Roby Lofts, PA-C  Sande Rives, [facility], you and your  health needs are our priority.  As part of our continuing mission to provide you with exceptional heart care, we have created designated Provider Care Teams.  These Care Teams include your primary Cardiologist (physician) and Advanced Practice Providers (APPs -  Physician Assistants and Nurse Practitioners) who all work together to provide you with the care you need, when you need it.  Thank you for choosing [facility] at [facility]!!

## 2018-06-12 ENCOUNTER — Other Ambulatory Visit: Payer: Self-pay

## 2018-06-12 DIAGNOSIS — J90 Pleural effusion, not elsewhere classified: Secondary | ICD-10-CM | POA: Diagnosis not present

## 2018-06-12 DIAGNOSIS — J441 Chronic obstructive pulmonary disease with (acute) exacerbation: Secondary | ICD-10-CM | POA: Diagnosis not present

## 2018-06-12 DIAGNOSIS — Z79899 Other long term (current) drug therapy: Secondary | ICD-10-CM

## 2018-06-12 DIAGNOSIS — R600 Localized edema: Secondary | ICD-10-CM | POA: Diagnosis not present

## 2018-06-12 LAB — BASIC METABOLIC PANEL
BUN / CREAT RATIO: 19 (ref 9–20)
BUN: 34 mg/dL — ABNORMAL HIGH (ref 6–24)
CO2: 22 mmol/L (ref 20–29)
CREATININE: 1.8 mg/dL — AB (ref 0.76–1.27)
Calcium: 8.4 mg/dL — ABNORMAL LOW (ref 8.7–10.2)
Chloride: 108 mmol/L — ABNORMAL HIGH (ref 96–106)
GFR, EST AFRICAN AMERICAN: 47 mL/min/{1.73_m2} — AB (ref >59)
GFR, EST NON AFRICAN AMERICAN: 40 mL/min/{1.73_m2} — AB (ref >59)
Glucose: 88 mg/dL (ref 65–99)
POTASSIUM: 3.2 mmol/L — AB (ref 3.5–5.2)
SODIUM: 145 mmol/L — AB (ref 134–144)

## 2018-06-12 LAB — CBC
Hematocrit: 33.1 % — ABNORMAL LOW (ref 37.5–51.0)
Hemoglobin: 10.5 g/dL — ABNORMAL LOW (ref 13.0–17.7)
MCH: 29.3 pg (ref 26.6–33.0)
MCHC: 31.7 g/dL (ref 31.5–35.7)
MCV: 93 fL (ref 79–97)
PLATELETS: 222 10*3/uL (ref 150–450)
RBC: 3.58 x10E6/uL — AB (ref 4.14–5.80)
RDW: 14.4 % (ref 11.6–15.4)
WBC: 4.3 10*3/uL (ref 3.4–10.8)

## 2018-06-12 LAB — BRAIN NATRIURETIC PEPTIDE: BNP: 1301.6 pg/mL — ABNORMAL HIGH (ref 0.0–100.0)

## 2018-06-12 MED ORDER — TORSEMIDE 20 MG PO TABS: ORAL_TABLET | ORAL | 6 refills | Status: DC

## 2018-06-12 MED ORDER — POTASSIUM CHLORIDE ER 10 MEQ PO TBCR: 10.0000 meq | EXTENDED_RELEASE_TABLET | Freq: Every day | ORAL | 3 refills | Status: DC

## 2018-06-12 MED ORDER — BENZONATATE 100 MG PO CAPS: 100.0000 mg | ORAL_CAPSULE | Freq: Three times a day (TID) | ORAL | 0 refills | Status: DC | PRN

## 2018-06-12 NOTE — Progress Notes (Signed)
We can increase his torsemide to 40 mg a day for 3 days

## 2018-06-12 NOTE — Progress Notes (Signed)
Notes recorded by Lendon Colonel, NP on 06/12/2018 at 8:56 AM EST Please begin potassium 20 mEq daily for two days and then 10 meq daily thereafter. He is on spironolactone which holds potassium. He will need a repeat BMET in one week.

## 2018-06-13 ENCOUNTER — Ambulatory Visit (HOSPITAL_COMMUNITY): Payer: 59 | Attending: Cardiovascular Disease

## 2018-06-13 ENCOUNTER — Other Ambulatory Visit (HOSPITAL_COMMUNITY): Payer: 59

## 2018-06-13 DIAGNOSIS — I313 Pericardial effusion (noninflammatory): Secondary | ICD-10-CM | POA: Insufficient documentation

## 2018-06-13 DIAGNOSIS — I3139 Other pericardial effusion (noninflammatory): Secondary | ICD-10-CM

## 2018-06-18 ENCOUNTER — Encounter: Payer: Self-pay | Admitting: Cardiovascular Disease

## 2018-06-18 ENCOUNTER — Ambulatory Visit (INDEPENDENT_AMBULATORY_CARE_PROVIDER_SITE_OTHER): Payer: 59 | Admitting: Cardiovascular Disease

## 2018-06-18 ENCOUNTER — Telehealth (HOSPITAL_COMMUNITY): Payer: Self-pay | Admitting: Vascular Surgery

## 2018-06-18 DIAGNOSIS — I272 Pulmonary hypertension, unspecified: Secondary | ICD-10-CM

## 2018-06-18 DIAGNOSIS — E782 Mixed hyperlipidemia: Secondary | ICD-10-CM | POA: Diagnosis not present

## 2018-06-18 DIAGNOSIS — F17200 Nicotine dependence, unspecified, uncomplicated: Secondary | ICD-10-CM | POA: Diagnosis not present

## 2018-06-18 DIAGNOSIS — I2510 Atherosclerotic heart disease of native coronary artery without angina pectoris: Secondary | ICD-10-CM | POA: Diagnosis not present

## 2018-06-18 DIAGNOSIS — Z9861 Coronary angioplasty status: Secondary | ICD-10-CM

## 2018-06-18 DIAGNOSIS — M349 Systemic sclerosis, unspecified: Secondary | ICD-10-CM | POA: Diagnosis not present

## 2018-06-18 DIAGNOSIS — I3139 Other pericardial effusion (noninflammatory): Secondary | ICD-10-CM

## 2018-06-18 DIAGNOSIS — I519 Heart disease, unspecified: Secondary | ICD-10-CM

## 2018-06-18 DIAGNOSIS — I313 Pericardial effusion (noninflammatory): Secondary | ICD-10-CM

## 2018-06-18 NOTE — Progress Notes (Signed)
06/18/2018 [name]   01/19/1961  [id number]  Primary Physician [name], MD Primary Cardiologist: Lorretta Harp MD [name], [place]  HPI:  [name] is a 58 y.o.  thin-appearing, African American male, father of 63, grandfather to 2 grandchildren who I last saw  02/12/2018. He isaccompaniedby his wife today.[name] He has a history of idiopathic pericardial effusion as well as scleroderma, hypertension, hyperlipidemia with low HDL. I catheterized him December 24, 2008, revealing a tight distal RCA stenosis which I ultimately stented with a bare-metal stent on January 07, 2009. He continues to smoke a pack a day. Dr. Merilynn Finland performed a subxiphoid window removing 200 mL of pericardial fluid which resulted in clinical improvement at that time. Followup echo performed November 2011 showed no effusion with normal EF. His other problems include hypertension and hyperlipidemia. He is otherwise asymptomatic and denied any chest pain or shortness of breath. The patient admits to not taking his simvastatin. His last Myoview stress test performed 05/14/12 was nonischemic. He denies chest painbut does have some dyspnea on exertion probably related to COPD.He had a recent chest x-ray performed because of chronic cough suspected bronchitis that was read as "consistent with congestive heart failure.His last 2-D [id number] normal LV systolic function, grade 2 diastolic dysfunction with moderate pericardial effusion without evidence of pericardial tamponade.   He did see Angie Duke PA-C in the office 01/15/2018 because of increasing shortness of breath.  Did have some mild peripheral edema.  2D echo showed normal LV systolic function with small nonhemodynamically significant pericardial effusion unchanged from last echo.  Myoview stress test was normal as well.  His hydrochlorthiazide was changed to torsemide.  His edema is improved as has his shortness of  breath. Since I saw him 3 months ago he is developed some increasing shortness of breath and dependent edema towards the end of the day.  Follow-up echo performed on 06/13/2018 showed an increase in his pericardial effusion, decrease in his LV function down to an EF of 40 to 45% and an increase in his PA pressures up to 100 mmHg.  His diuretics were adjusted.  Current Meds  Medication Sig  . allopurinol (ZYLOPRIM) 100 MG tablet TAKE 1 TABLET BY MOUTH EVERY DAY  . ALPRAZolam (XANAX) 0.5 MG tablet Take 0.5 mg by mouth every 6 (six) hours as needed.  [name] amLODipine (NORVASC) 10 MG tablet Take 10 mg by mouth daily.  [name] aspirin 81 MG chewable tablet Chew 81 mg by mouth See admin instructions. Pt taking once a week sometimes due to causing nose bleeds  . benzonatate (TESSALON PERLES) 100 MG capsule Take 1 capsule (100 mg total) by mouth 3 (three) times daily as needed for cough. REFILLS WITH PCP  . colchicine 0.6 MG tablet Take 1 tablet (0.6 mg total) by mouth daily as needed.  . ferrous sulfate 325 (65 FE) MG EC tablet Take 325 mg by mouth every other day.   . fluticasone (FLONASE) 50 MCG/ACT nasal spray Place 2 sprays into both nostrils daily as needed for allergies.   [name] HYDROcodone-acetaminophen (VICODIN) 5-500 MG per tablet Take 1 tablet by mouth every 6 (six) hours as needed for pain.   . isosorbide mononitrate (IMDUR) 60 MG 24 hr tablet TAKE 1/2 TABLET BY MOUTH EVERY DAY  . lisinopril (PRINIVIL,ZESTRIL) 40 MG tablet TAKE 1 TABLET BY MOUTH EVERYDAY AT BEDTIME  . nebivolol (BYSTOLIC) 10 MG tablet Take 1 tablet (10 mg total) by mouth daily.  DUE FOR OV  . nitroGLYCERIN (NITROGLYN) 2 % ointment Apply 1/4 inch to fingertips and toes 3 times a days as needed.  . nitroGLYCERIN (NITROSTAT) 0.4 MG SL tablet Place 1 tablet (0.4 mg total) under the tongue every 5 (five) minutes as needed.  . pantoprazole (PROTONIX) 40 MG tablet TAKE 1 TABLET (40 MG TOTAL) BY MOUTH DAILY.  [name] potassium chloride (K-DUR) 10 MEQ  tablet Take 1 tablet (10 mEq total) by mouth daily. 20 mEq daily 2 days then 10 meq daily thereafter  . simvastatin (ZOCOR) 40 MG tablet Take 1 tablet (40 mg total) by mouth daily at 6 PM. (Patient taking differently: Take 40 mg by mouth every morning. )  . spironolactone (ALDACTONE) 25 MG tablet TAKE 1 TABLET BY MOUTH EVERY DAY IN THE MORNING  . torsemide (DEMADEX) 20 MG tablet TAKE 1 TABLET BY MOUTH EVERY DAY IN THE MORNING     No Known Allergies  Social History   Socioeconomic History  . Marital status: Married    Spouse name: [name]  . Number of children: 6  . Years of education: 52  . Highest education level: Not on file  Occupational History  . Occupation: Development worker, community  Social Needs  . Financial resource strain: Not on file  . Food insecurity:    Worry: Not on file    Inability: Not on file  . Transportation needs:    Medical: Not on file    Non-medical: Not on file  Tobacco Use  . Smoking status: Current Every Day Smoker    Packs/day: 1.00    Years: 38.00    Pack years: 38.00    Types: Cigarettes    Start date: 10/21/1971  . Smokeless tobacco: Never Used  Substance and Sexual Activity  . Alcohol use: Yes    Alcohol/week: 4.0 - 5.0 standard drinks    Types: 4 - 5 Standard drinks or equivalent per week  . Drug use: No  . Sexual activity: Yes    Partners: Female  Lifestyle  . Physical activity:    Days per week: Not on file    Minutes per session: Not on file  . Stress: Not on file  Relationships  . Social connections:    Talks on phone: Not on file    Gets together: Not on file    Attends religious service: Not on file    Active member of club or organization: Not on file    Attends meetings of clubs or organizations: Not on file    Relationship status: Not on file  . Intimate partner violence:    Fear of current or ex partner: Not on file    Emotionally abused: Not on file    Physically abused: Not on file    Forced sexual activity: Not on  file  Other Topics Concern  . Not on file  Social History Narrative   Lives with his wife and one daughter.     Review of Systems: General: negative for chills, fever, night sweats or weight changes.  Cardiovascular: negative for chest pain, dyspnea on exertion, edema, orthopnea, palpitations, paroxysmal nocturnal dyspnea or shortness of breath Dermatological: negative for rash Respiratory: negative for cough or wheezing Urologic: negative for hematuria Abdominal: negative for nausea, vomiting, diarrhea, bright red blood per rectum, melena, or hematemesis Neurologic: negative for visual changes, syncope, or dizziness All other systems reviewed and are otherwise negative except as noted above.    Blood pressure 132/84, pulse 69, height 5' 4" (  1.626 m), weight 118 lb (53.5 kg).  General appearance: alert and no distress Neck: no adenopathy, no carotid bruit, no JVD, supple, symmetrical, trachea midline and thyroid not enlarged, symmetric, no tenderness/mass/nodules Lungs: clear to auscultation bilaterally Heart: regular rate and rhythm, S1, S2 normal, no murmur, click, rub or gallop Extremities: extremities normal, atraumatic, no cyanosis or edema Pulses: 2+ and symmetric Skin: Skin color, texture, turgor normal. No rashes or lesions Neurologic: Alert and oriented X 3, normal strength and tone. Normal symmetric reflexes. Normal coordination and gait  EKG not performed today  ASSESSMENT AND PLAN:   Scleroderma History of scleroderma followed by Dr. Estanislado Pandy .  Smoker Ongoing tobacco abuse recalcitrant to risk factor modification.  CAD S/P percutaneous coronary angioplasty History of CAD status post distal RCA bare-metal stenting 01/07/2009 by myself without recurrence.  Hyperlipidemia History of hyperlipidemia on statin therapy with lipid profile performed 02/22/2018 revealing total cholesterol 124, LDL 74 and HDL 37.  Pericardial effusion History of idiopathic pericardial  effusion status post subxiphoid window placed by Dr. [name] September 2010.  He has had a moderate effusion by 2D echoes over the years however most recently 2D echo performed 06/13/2018 revealed a larger pericardial effusion.  There was no evidence of tamponade by ultrasonographic criteria.  Pulmonary hypertension (HCC) History of severe pulmonary hypertension with recent 2D echo over read by Dr. Sallyanne Kuster suggesting pulmonary pressures in the 100 range.  This represents an increase from prior echoes.  Does have a history of scleroderma.  Left ventricular dysfunction Recent 2D echo shows a decline in LV function to the 40 to 45% range compared to a prior echo performed in October at which time it was normal.      Lorretta Harp MD [facility], [facility] 06/18/2018 10:42 AM

## 2018-06-18 NOTE — Assessment & Plan Note (Signed)
History of idiopathic pericardial effusion status post subxiphoid window placed by Dr. [name] September 2010.  He has had a moderate effusion by 2D echoes over the years however most recently 2D echo performed 06/13/2018 revealed a larger pericardial effusion.  There was no evidence of tamponade by ultrasonographic criteria.

## 2018-06-18 NOTE — Assessment & Plan Note (Signed)
History of hyperlipidemia on statin therapy with lipid profile performed 02/22/2018 revealing total cholesterol 124, LDL 74 and HDL 37.

## 2018-06-18 NOTE — Assessment & Plan Note (Signed)
Recent 2D echo shows a decline in LV function to the 40 to 45% range compared to a prior echo performed in October at which time it was normal.

## 2018-06-18 NOTE — Assessment & Plan Note (Signed)
History of scleroderma followed by Dr. Estanislado Pandy .

## 2018-06-18 NOTE — Assessment & Plan Note (Signed)
History of severe pulmonary hypertension with recent 2D echo over read by Dr. Sallyanne Kuster suggesting pulmonary pressures in the 100 range.  This represents an increase from prior echoes.  Does have a history of scleroderma.

## 2018-06-18 NOTE — Assessment & Plan Note (Signed)
History of CAD status post distal RCA bare-metal stenting 01/07/2009 by myself without recurrence.

## 2018-06-18 NOTE — Patient Instructions (Addendum)
Medication Instructions:  Your physician recommends that you continue on your current medications as directed. Please refer to the Current Medication list given to you today.  If you need a refill on your cardiac medications before your next appointment, please call your pharmacy.   Lab work: NONE If you have labs (blood work) drawn today and your tests are completely normal, you will receive your results only by: [name] MyChart Message (if you have MyChart) OR . A paper copy in the mail If you have any lab test that is abnormal or we need to change your treatment, we will call you to review the results.  Testing/Procedures: NONE  Follow-Up: At [facility], you and your health needs are our priority.  As part of our continuing mission to provide you with exceptional heart care, we have created designated Provider Care Teams.  These Care Teams include your primary Cardiologist (physician) and Advanced Practice Providers (APPs -  Physician Assistants and Nurse Practitioners) who all work together to provide you with the care you need, when you need it. . You will need a follow up appointment in 3 months.  Please call our office 2 months in advance to schedule this appointment.  You may see Dr. [name] or one of the following Advanced Practice Providers on your designated Care Team:   . Kerin Ransom, [place] . Almyra Deforest, PA-C . Fabian Sharp, PA-C . Jory Sims, DNP . Rosaria Ferries, PA-C . Roby Lofts, PA-C . Sande Rives, PA-C  Any Other Special Instructions Will Be Listed Below (If Applicable).  REFERRAL TO DR. DANIEL BENSIMHON (SCHEDULE IN 1-2 WEEKS) IN ADVANCED HEART FAILURE CLINIC  REFERRAL TO DR. Remo Lipps [place] IN CARDIOTHORACIC SURGERY (TCTS) FOR REPEAT OF SUBXIPHOID PERICARDIAL WINDOW

## 2018-06-18 NOTE — Assessment & Plan Note (Signed)
Ongoing tobacco abuse recalcitrant to risk factor modification.

## 2018-06-18 NOTE — Telephone Encounter (Signed)
Left pt message giving new chf appt w/ DB 06/21/18 @ 1:20 PM

## 2018-06-19 ENCOUNTER — Ambulatory Visit: Payer: 59 | Admitting: Thoracic Surgery (Cardiothoracic Vascular Surgery)

## 2018-06-21 ENCOUNTER — Encounter (HOSPITAL_COMMUNITY): Payer: 59 | Admitting: Internal Medicine
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
## 2018-06-24 ENCOUNTER — Ambulatory Visit: Payer: 59 | Admitting: Rheumatology

## 2018-06-24 ENCOUNTER — Encounter: Payer: Self-pay | Admitting: Rheumatology

## 2018-06-24 ENCOUNTER — Telehealth: Payer: Self-pay | Admitting: Rheumatology

## 2018-06-24 VITALS — BP 142/94 | HR 65 | Resp 14 | Ht 68.0 in | Wt 120.6 lb

## 2018-06-24 DIAGNOSIS — M349 Systemic sclerosis, unspecified: Secondary | ICD-10-CM

## 2018-06-24 DIAGNOSIS — E559 Vitamin D deficiency, unspecified: Secondary | ICD-10-CM

## 2018-06-24 DIAGNOSIS — I272 Pulmonary hypertension, unspecified: Secondary | ICD-10-CM

## 2018-06-24 DIAGNOSIS — Z8679 Personal history of other diseases of the circulatory system: Secondary | ICD-10-CM | POA: Diagnosis not present

## 2018-06-24 DIAGNOSIS — F17200 Nicotine dependence, unspecified, uncomplicated: Secondary | ICD-10-CM

## 2018-06-24 DIAGNOSIS — M1A09X0 Idiopathic chronic gout, multiple sites, without tophus (tophi): Secondary | ICD-10-CM

## 2018-06-24 DIAGNOSIS — L80 Vitiligo: Secondary | ICD-10-CM

## 2018-06-24 DIAGNOSIS — R3121 Asymptomatic microscopic hematuria: Secondary | ICD-10-CM

## 2018-06-24 DIAGNOSIS — I7300 Raynaud's syndrome without gangrene: Secondary | ICD-10-CM | POA: Diagnosis not present

## 2018-06-24 DIAGNOSIS — Z8709 Personal history of other diseases of the respiratory system: Secondary | ICD-10-CM

## 2018-06-24 DIAGNOSIS — R809 Proteinuria, unspecified: Secondary | ICD-10-CM

## 2018-06-24 DIAGNOSIS — I2510 Atherosclerotic heart disease of native coronary artery without angina pectoris: Secondary | ICD-10-CM

## 2018-06-24 DIAGNOSIS — Z9861 Coronary angioplasty status: Secondary | ICD-10-CM

## 2018-06-24 DIAGNOSIS — Z87448 Personal history of other diseases of urinary system: Secondary | ICD-10-CM

## 2018-06-24 DIAGNOSIS — I519 Heart disease, unspecified: Secondary | ICD-10-CM

## 2018-06-24 NOTE — Telephone Encounter (Signed)
FYI:  Patient paid $25 cash for [employer] paperwork

## 2018-06-25 ENCOUNTER — Encounter: Payer: Self-pay | Admitting: Cardiovascular Disease

## 2018-06-25 ENCOUNTER — Ambulatory Visit: Payer: 59 | Admitting: Cardiovascular Disease

## 2018-06-25 DIAGNOSIS — I519 Heart disease, unspecified: Secondary | ICD-10-CM

## 2018-06-25 NOTE — Progress Notes (Signed)
Will he has moderate LV dysfunction with severe pulmonary hypertension and a large pericardial effusion.  This may be related to his scleroderma.  He is on colchicine.  I arrange for him to see Dr. Haroldine Laws last week however he did not show up for the appointment.  He has an appointment with Dr. [name] this Thursday for consideration of redo subxiphoid window.  His EKG today shows sinus rhythm at 65 with lateral T wave inversion.  He remains somewhat short of breath.   Lorretta Harp, M.D., [place], [facility], [place] [address] [place], [place]  [id number]  [phone] 06/25/2018 2:07 PM

## 2018-06-25 NOTE — Patient Instructions (Signed)
Medication Instructions:  Your physician recommends that you continue on your current medications as directed. Please refer to the Current Medication list given to you today.  If you need a refill on your cardiac medications before your next appointment, please call your pharmacy.   Lab work: NONE If you have labs (blood work) drawn today and your tests are completely normal, you will receive your results only by: [name] MyChart Message (if you have MyChart) OR . A paper copy in the mail If you have any lab test that is abnormal or we need to change your treatment, we will call you to review the results.  Testing/Procedures: NONE  Follow-Up: At [facility], you and your health needs are our priority.  As part of our continuing mission to provide you with exceptional heart care, we have created designated Provider Care Teams.  These Care Teams include your primary Cardiologist (physician) and Advanced Practice Providers (APPs -  Physician Assistants and Nurse Practitioners) who all work together to provide you with the care you need, when you need it. . You will need a follow up appointment in 3 months.  You may see Dr. [name] or one of the following Advanced Practice Providers on your designated Care Team:   . Kerin Ransom, [place] . Almyra Deforest, PA-C . Fabian Sharp, PA-C . Jory Sims, DNP . Rosaria Ferries, PA-C . Roby Lofts, PA-C . Sande Rives, PA-C  Any Other Special Instructions Will Be Listed Below (If Applicable). YOU WERE REFERRED TO DR. DANIEL BENSIMHON IN THE [facility]. YOU WILL NEED TO RESCHEDULE THIS APPOINTMENT.  PLEASE KEEP YOUR APPOINTMENT WITH  DR. Remo Lipps [facility] IN CARDIOTHORACIC SURGERY (TCTS) FOR REPEAT OF SUBXIPHOID PERICARDIAL WINDOW. THIS IS SCHEDULED FOR 06/27/2018

## 2018-06-25 NOTE — Assessment & Plan Note (Signed)
Will he has moderate LV dysfunction with severe pulmonary hypertension and a large pericardial effusion.  This may be related to his scleroderma.  He is on colchicine.  I arrange for him to see Dr. Haroldine Laws last week however he did not show up for the appointment.  He has an appointment with Dr. [name] this Thursday for consideration of redo subxiphoid window.  His EKG today shows sinus rhythm at 65 with lateral T wave inversion.  He remains somewhat short of breath.

## 2018-06-27 ENCOUNTER — Ambulatory Visit: Payer: 59 | Admitting: Thoracic Surgery (Cardiothoracic Vascular Surgery)

## 2018-06-27 VITALS — BP 124/82 | HR 60 | Resp 20 | Ht 68.5 in | Wt 120.0 lb

## 2018-06-27 DIAGNOSIS — I313 Pericardial effusion (noninflammatory): Secondary | ICD-10-CM | POA: Diagnosis not present

## 2018-06-27 DIAGNOSIS — I3139 Other pericardial effusion (noninflammatory): Secondary | ICD-10-CM
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
## 2018-06-28 ENCOUNTER — Telehealth: Payer: Self-pay | Admitting: Rheumatology

## 2018-06-28 NOTE — Telephone Encounter (Signed)
FYI:  Patient paid $25 cash for additional [employer] paperwork.

## 2018-07-01 ENCOUNTER — Encounter: Payer: Self-pay | Admitting: Thoracic Surgery (Cardiothoracic Vascular Surgery)

## 2018-07-01 ENCOUNTER — Other Ambulatory Visit (HOSPITAL_COMMUNITY): Payer: Self-pay

## 2018-07-01 ENCOUNTER — Encounter (HOSPITAL_COMMUNITY)
Admission: AD | Disposition: A | Discharge status: Home / Self Care | Payer: Self-pay | Source: Ambulatory Visit | Attending: Internal Medicine

## 2018-07-01 ENCOUNTER — Other Ambulatory Visit: Payer: Self-pay

## 2018-07-01 ENCOUNTER — Encounter (HOSPITAL_COMMUNITY): Payer: Self-pay | Admitting: Internal Medicine

## 2018-07-01 ENCOUNTER — Ambulatory Visit (HOSPITAL_COMMUNITY)
Admission: AD | Admit: 2018-07-01 | Discharge: 2018-07-01 | Disposition: A | Discharge status: Home / Self Care | Payer: 59 | Source: Ambulatory Visit | Attending: Internal Medicine | Admitting: Internal Medicine

## 2018-07-01 ENCOUNTER — Ambulatory Visit (HOSPITAL_BASED_OUTPATIENT_CLINIC_OR_DEPARTMENT_OTHER)
Admission: RE | Admit: 2018-07-01 | Discharge: 2018-07-01 | Disposition: A | Discharge status: Home / Self Care | Payer: 59 | Source: Ambulatory Visit | Attending: Internal Medicine | Admitting: Internal Medicine

## 2018-07-01 VITALS — BP 138/84 | HR 69 | Wt 124.2 lb

## 2018-07-01 DIAGNOSIS — Z955 Presence of coronary angioplasty implant and graft: Secondary | ICD-10-CM

## 2018-07-01 DIAGNOSIS — M109 Gout, unspecified: Secondary | ICD-10-CM | POA: Insufficient documentation

## 2018-07-01 DIAGNOSIS — M349 Systemic sclerosis, unspecified: Secondary | ICD-10-CM | POA: Insufficient documentation

## 2018-07-01 DIAGNOSIS — F419 Anxiety disorder, unspecified: Secondary | ICD-10-CM

## 2018-07-01 DIAGNOSIS — Z79899 Other long term (current) drug therapy: Secondary | ICD-10-CM | POA: Insufficient documentation

## 2018-07-01 DIAGNOSIS — E785 Hyperlipidemia, unspecified: Secondary | ICD-10-CM

## 2018-07-01 DIAGNOSIS — F17210 Nicotine dependence, cigarettes, uncomplicated: Secondary | ICD-10-CM

## 2018-07-01 DIAGNOSIS — I2721 Secondary pulmonary arterial hypertension: Secondary | ICD-10-CM | POA: Diagnosis not present

## 2018-07-01 DIAGNOSIS — Z7982 Long term (current) use of aspirin: Secondary | ICD-10-CM | POA: Insufficient documentation

## 2018-07-01 DIAGNOSIS — I252 Old myocardial infarction: Secondary | ICD-10-CM | POA: Insufficient documentation

## 2018-07-01 DIAGNOSIS — E039 Hypothyroidism, unspecified: Secondary | ICD-10-CM | POA: Insufficient documentation

## 2018-07-01 DIAGNOSIS — I272 Pulmonary hypertension, unspecified: Secondary | ICD-10-CM

## 2018-07-01 DIAGNOSIS — I313 Pericardial effusion (noninflammatory): Secondary | ICD-10-CM | POA: Insufficient documentation

## 2018-07-01 DIAGNOSIS — I2510 Atherosclerotic heart disease of native coronary artery without angina pectoris: Secondary | ICD-10-CM

## 2018-07-01 DIAGNOSIS — I10 Essential (primary) hypertension: Secondary | ICD-10-CM | POA: Insufficient documentation

## 2018-07-01 DIAGNOSIS — Z8269 Family history of other diseases of the musculoskeletal system and connective tissue: Secondary | ICD-10-CM | POA: Insufficient documentation

## 2018-07-01 DIAGNOSIS — I110 Hypertensive heart disease with heart failure: Secondary | ICD-10-CM | POA: Insufficient documentation

## 2018-07-01 DIAGNOSIS — I50810 Right heart failure, unspecified: Secondary | ICD-10-CM

## 2018-07-01 HISTORY — PX: RIGHT/LEFT HEART CATH AND CORONARY ANGIOGRAPHY: CATH118266

## 2018-07-01 LAB — POCT I-STAT EG7
ACID-BASE DEFICIT: 2 mmol/L (ref 0.0–2.0)
Acid-base deficit: 2 mmol/L (ref 0.0–2.0)
BICARBONATE: 21.9 mmol/L (ref 20.0–28.0)
Bicarbonate: 21.8 mmol/L (ref 20.0–28.0)
Calcium, Ion: 0.99 mmol/L — ABNORMAL LOW (ref 1.15–1.40)
Calcium, Ion: 1.07 mmol/L — ABNORMAL LOW (ref 1.15–1.40)
HCT: 31 % — ABNORMAL LOW (ref 39.0–52.0)
HEMATOCRIT: 31 % — AB (ref 39.0–52.0)
Hemoglobin: 10.5 g/dL — ABNORMAL LOW (ref 13.0–17.0)
Hemoglobin: 10.5 g/dL — ABNORMAL LOW (ref 13.0–17.0)
O2 Saturation: 64 %
O2 Saturation: 61 %
PH VEN: 7.427 (ref 7.250–7.430)
POTASSIUM: 3.3 mmol/L — AB (ref 3.5–5.1)
Potassium: 3.2 mmol/L — ABNORMAL LOW (ref 3.5–5.1)
SODIUM: 143 mmol/L (ref 135–145)
SODIUM: 143 mmol/L (ref 135–145)
TCO2: 23 mmol/L (ref 22–32)
TCO2: 23 mmol/L (ref 22–32)
pCO2, Ven: 33 mmHg — ABNORMAL LOW (ref 44.0–60.0)
pCO2, Ven: 32.7 mmHg — ABNORMAL LOW (ref 44.0–60.0)
pH, Ven: 7.433 — ABNORMAL HIGH (ref 7.250–7.430)
pO2, Ven: 32 mmHg (ref 32.0–45.0)
pO2, Ven: 30 mmHg — CL (ref 32.0–45.0)

## 2018-07-01 LAB — BASIC METABOLIC PANEL
ANION GAP: 9 (ref 5–15)
BUN: 42 mg/dL — ABNORMAL HIGH (ref 6–20)
CO2: 22 mmol/L (ref 22–32)
Calcium: 8 mg/dL — ABNORMAL LOW (ref 8.9–10.3)
Chloride: 111 mmol/L (ref 98–111)
Creatinine, Ser: 2 mg/dL — ABNORMAL HIGH (ref 0.61–1.24)
GFR calc Af Amer: 41 mL/min — ABNORMAL LOW (ref >60)
GFR, EST NON AFRICAN AMERICAN: 35 mL/min — AB (ref >60)
Glucose, Bld: 86 mg/dL (ref 70–99)
Potassium: 3.1 mmol/L — ABNORMAL LOW (ref 3.5–5.1)
Sodium: 142 mmol/L (ref 135–145)

## 2018-07-01 LAB — POCT I-STAT 7, (LYTES, BLD GAS, ICA,H+H)
Acid-base deficit: 3 mmol/L — ABNORMAL HIGH (ref 0.0–2.0)
Bicarbonate: 19.7 mmol/L — ABNORMAL LOW (ref 20.0–28.0)
Calcium, Ion: 0.95 mmol/L — ABNORMAL LOW (ref 1.15–1.40)
HEMATOCRIT: 29 % — AB (ref 39.0–52.0)
Hemoglobin: 9.9 g/dL — ABNORMAL LOW (ref 13.0–17.0)
O2 Saturation: 97 %
Potassium: 3.1 mmol/L — ABNORMAL LOW (ref 3.5–5.1)
Sodium: 146 mmol/L — ABNORMAL HIGH (ref 135–145)
TCO2: 21 mmol/L — AB (ref 22–32)
pCO2 arterial: 26.2 mmHg — ABNORMAL LOW (ref 32.0–48.0)
pH, Arterial: 7.485 — ABNORMAL HIGH (ref 7.350–7.450)
pO2, Arterial: 85 mmHg (ref 83.0–108.0)

## 2018-07-01 LAB — CBC
HCT: 33.5 % — ABNORMAL LOW (ref 39.0–52.0)
HEMOGLOBIN: 10.5 g/dL — AB (ref 13.0–17.0)
MCH: 29.8 pg (ref 26.0–34.0)
MCHC: 31.3 g/dL (ref 30.0–36.0)
MCV: 95.2 fL (ref 80.0–100.0)
Platelets: 158 10*3/uL (ref 150–400)
RBC: 3.52 MIL/uL — AB (ref 4.22–5.81)
RDW: 16.5 % — ABNORMAL HIGH (ref 11.5–15.5)
WBC: 4.5 10*3/uL (ref 4.0–10.5)
nRBC: 0 % (ref 0.0–0.2)

## 2018-07-01 SURGERY — RIGHT/LEFT HEART CATH AND CORONARY ANGIOGRAPHY
Anesthesia: LOCAL

## 2018-07-01 MED ORDER — HEPARIN (PORCINE) IN NACL 1000-0.9 UT/500ML-% IV SOLN
INTRAVENOUS | Status: AC
  Filled 2018-07-01: qty 1000

## 2018-07-01 MED ORDER — VERAPAMIL HCL 2.5 MG/ML IV SOLN
INTRAVENOUS | Status: DC | PRN
  Administered 2018-07-01: 14:00:00 via INTRA_ARTERIAL

## 2018-07-01 MED ORDER — SILDENAFIL CITRATE 20 MG PO TABS: 20.0000 mg | ORAL_TABLET | Freq: Three times a day (TID) | ORAL | 12 refills | Status: DC

## 2018-07-01 MED ORDER — ASPIRIN 81 MG PO CHEW
81.0000 mg | CHEWABLE_TABLET | ORAL | Status: AC
  Administered 2018-07-01: 81 mg via ORAL
  Filled 2018-07-01: qty 1

## 2018-07-01 MED ORDER — SODIUM CHLORIDE 0.9 % IV SOLN: 250.0000 mL | INTRAVENOUS | Status: DC | PRN

## 2018-07-01 MED ORDER — LIDOCAINE HCL (PF) 1 % IJ SOLN
INTRAMUSCULAR | Status: DC | PRN
  Administered 2018-07-01 (×2): 2 mL

## 2018-07-01 MED ORDER — IOHEXOL 350 MG/ML SOLN
INTRAVENOUS | Status: DC | PRN
  Administered 2018-07-01: 25 mL via INTRAVENOUS

## 2018-07-01 MED ORDER — HEPARIN SODIUM (PORCINE) 1000 UNIT/ML IJ SOLN
INTRAMUSCULAR | Status: DC | PRN
  Administered 2018-07-01: 2500 [IU] via INTRAVENOUS

## 2018-07-01 MED ORDER — VERAPAMIL HCL 2.5 MG/ML IV SOLN
INTRAVENOUS | Status: AC
  Filled 2018-07-01: qty 2

## 2018-07-01 MED ORDER — ACETAMINOPHEN 325 MG PO TABS: 650.0000 mg | ORAL_TABLET | ORAL | Status: DC | PRN

## 2018-07-01 MED ORDER — LIDOCAINE HCL (PF) 1 % IJ SOLN
INTRAMUSCULAR | Status: AC
  Filled 2018-07-01: qty 30

## 2018-07-01 MED ORDER — SODIUM CHLORIDE 0.9% FLUSH: 3.0000 mL | INTRAVENOUS | Status: DC | PRN

## 2018-07-01 MED ORDER — SODIUM CHLORIDE 0.9% FLUSH: 3.0000 mL | Freq: Two times a day (BID) | INTRAVENOUS | Status: DC

## 2018-07-01 MED ORDER — SODIUM CHLORIDE 0.9 % IV SOLN
INTRAVENOUS | Status: DC
  Administered 2018-07-01: 13:00:00 via INTRAVENOUS

## 2018-07-01 MED ORDER — HEPARIN (PORCINE) IN NACL 1000-0.9 UT/500ML-% IV SOLN
INTRAVENOUS | Status: DC | PRN
  Administered 2018-07-01 (×2): 500 mL

## 2018-07-01 MED ORDER — POTASSIUM CHLORIDE CRYS ER 20 MEQ PO TBCR
40.0000 meq | EXTENDED_RELEASE_TABLET | Freq: Once | ORAL | Status: AC
  Administered 2018-07-01: 40 meq via ORAL
  Filled 2018-07-01: qty 2

## 2018-07-01 MED ORDER — ONDANSETRON HCL 4 MG/2ML IJ SOLN: 4.0000 mg | Freq: Four times a day (QID) | INTRAMUSCULAR | Status: DC | PRN

## 2018-07-01 SURGICAL SUPPLY — 14 items
CATH 5FR JL3.5 JR4 ANG PIG MP (CATHETERS) ×1 IMPLANT
CATH BALLN WEDGE 5F 110CM (CATHETERS) ×1 IMPLANT
DEVICE RAD COMP TR BAND LRG (VASCULAR PRODUCTS) ×1 IMPLANT
GLIDESHEATH SLEND SS 6F .021 (SHEATH) ×1 IMPLANT
GUIDEWIRE .025 260CM (WIRE) ×1 IMPLANT
GUIDEWIRE INQWIRE 1.5J.035X260 (WIRE) IMPLANT
INQWIRE 1.5J .035X260CM (WIRE) ×2
KIT HEART LEFT (KITS) ×2 IMPLANT
PACK CARDIAC CATHETERIZATION (CUSTOM PROCEDURE TRAY) ×2 IMPLANT
SHEATH GLIDE SLENDER 4/5FR (SHEATH) ×1 IMPLANT
TRANSDUCER W/STOPCOCK (MISCELLANEOUS) ×3 IMPLANT
TUBING ART PRESS 72  MALE/FEM (TUBING) ×1
TUBING ART PRESS 72 MALE/FEM (TUBING) IMPLANT
TUBING CIL FLEX 10 FLL-RA (TUBING) ×2 IMPLANT

## 2018-07-01 NOTE — Discharge Instructions (Signed)
Radial Site Care  This sheet gives you information about how to care for yourself after your procedure. Your health care provider may also give you more specific instructions. If you have problems or questions, contact your health care provider. What can I expect after the procedure? After the procedure, it is common to have:  Bruising and tenderness at the catheter insertion area. Follow these instructions at home: Medicines  Take over-the-counter and prescription medicines only as told by your health care provider. Insertion site care  Follow instructions from your health care provider about how to take care of your insertion site. Make sure you: ? Wash your hands with soap and water before you change your bandage (dressing). If soap and water are not available, use hand sanitizer. ? Change your dressing as told by your health care provider. ? Leave stitches (sutures), skin glue, or adhesive strips in place. These skin closures may need to stay in place for 2 weeks or longer. If adhesive strip edges start to loosen and curl up, you may trim the loose edges. Do not remove adhesive strips completely unless your health care provider tells you to do that.  Check your insertion site every day for signs of infection. Check for: ? Redness, swelling, or pain. ? Fluid or blood. ? Pus or a bad smell. ? Warmth.  Do not take baths, swim, or use a hot tub until your health care provider approves.  You may shower 24-48 hours after the procedure, or as directed by your health care provider. ? Remove the dressing and gently wash the site with plain soap and water. ? Pat the area dry with a clean towel. ? Do not rub the site. That could cause bleeding.  Do not apply powder or lotion to the site. Activity   For 24 hours after the procedure, or as directed by your health care provider: ? Do not flex or bend the affected arm. ? Do not push or pull heavy objects with the affected arm. ? Do not  drive yourself home from the hospital or clinic. You may drive 24 hours after the procedure unless your health care provider tells you not to. ? Do not operate machinery or power tools.  Do not lift anything that is heavier than 10 lb (4.5 kg), or the limit that you are told, until your health care provider says that it is safe.  Ask your health care provider when it is okay to: ? Return to work or school. ? Resume usual physical activities or sports. ? Resume sexual activity. General instructions  If the catheter site starts to bleed, raise your arm and put firm pressure on the site. If the bleeding does not stop, get help right away. This is a medical emergency.  If you went home on the same day as your procedure, a responsible adult should be with you for the first 24 hours after you arrive home.  Keep all follow-up visits as told by your health care provider. This is important. Contact a health care provider if:  You have a fever.  You have redness, swelling, or yellow drainage around your insertion site. Get help right away if:  You have unusual pain at the radial site.  The catheter insertion area swells very fast.  The insertion area is bleeding, and the bleeding does not stop when you hold steady pressure on the area.  Your arm or hand becomes pale, cool, tingly, or numb. These symptoms may represent a serious problem  that is an emergency. Do not wait to see if the symptoms will go away. Get medical help right away. Call your local emergency services (911 in the [place] Do not drive yourself to the hospital. Summary  After the procedure, it is common to have bruising and tenderness at the site.  Follow instructions from your health care provider about how to take care of your radial site wound. Check the wound every day for signs of infection.  Do not lift anything that is heavier than 10 lb (4.5 kg), or the limit that you are told, until your health care provider says  that it is safe. This information is not intended to replace advice given to you by your health care provider. Make sure you discuss any questions you have with your health care provider. Document Released: 05/13/2010 Document Revised: 05/16/2017 Document Reviewed: 05/16/2017 Elsevier Interactive Patient Education  2019 [employer].

## 2018-07-01 NOTE — Progress Notes (Signed)
ADVANCED HF CLINIC CONSULT NOTE  Referring Physician: [name]  Primary Care: [name]: [name] [name]  HPI:   [name] is a 58 y.o.male with h/o scleroderma (diagnosed early 2000s), tobacco abuse, HTN, CAD s/p MI (s/p dRCA stent 2010) and previous pericardial effusion s/p pericardial window in 2010 by Dr. [name] who is referred by Dr. [name] due to recurrent pericardial effusion.   2-D EGBT51/7/61YWVPXTGG normal LV systolic function, grade 2 diastolic dysfunction with moderate pericardial effusion without evidence of pericardial tamponade.  He was seen in [facility] office on 01/15/2018 due to increasing shortness of breath. 2D echo showed normal LV systolic function with small nonhemodynamically significant pericardial effusion unchanged from last echo. Myoview stress test was normal as well. His hydrochlorthiazide was changed to torsemide.   He saw Dr. [name] in February with worsening SOB and echo done 06/13/18 showed LVEF 40-45% (I felt EF 50-55%)with severe RV dysfunction, RVSP > 155mHG and large pericardial effusion. His diuretics were increased. He has subsequently seen Dr. HRoxan Hockeywho felt his effusion was likely related to his scleroderma and felt he would benefit from medical therapy prior to repeat surgical pericardia drainage which would require VATS.   CXR 06/11/18: Bilateral pleural effusions. No mass  Feels very weak. Can only walk a few feet before getting SOB. Edema has improved with increased diuretics. Has severe cough and gets dizzy when he coughs. Pain in fingers from scleroderma   Was smoking 1ppd until recently. Now about 1/2 ppd.   Review of Systems: [y] = yes, _0  = no   General: Weight gain _1 ; Weight loss _2 ; Anorexia _3 ; Fatigue _4 ; Fever _5 ; Chills _6 ; Weakness _7   Cardiac: Chest pain/pressure _8 ; Resting SOB _9 ; Exertional SOB _10 ; Orthopnea _11 ; Pedal Edema _12 ; Palpitations _13 ; Syncope _14 ; Presyncope _15 ;  Paroxysmal nocturnal dyspnea_16   Pulmonary: Cough _17 ; Wheezing_18 ; Hemoptysis_19 ; Sputum _20 ; Snoring _21   GI: Vomiting_22 ; Dysphagia_23 ; Melena_24 ; Hematochezia _25 ; Heartburn_26 ; Abdominal pain _27 ; Constipation _28 ; Diarrhea _29 ; BRBPR _30   GU: Hematuria_31 ; Dysuria _32 ; Nocturia_33   Vascular: Pain in legs with walking _34 ; Pain in feet with lying flat _35 ; Non-healing sores _36 ; Stroke _37 ; TIA _38 ; Slurred speech _39 ;  Neuro: Headaches_40 ; Vertigo_41 ; Seizures_42 ; Paresthesias_43 ;Blurred vision _44 ; Diplopia _45 ; Vision changes _46   Ortho/Skin: Arthritis _47 ; Joint pain _48 ; Muscle pain _49 ; Joint swelling _50 ; Back Pain _51 ; Rash _52   Psych: Depression_53 ; Anxiety_54   Heme: Bleeding problems _55 ; Clotting disorders _56 ; Anemia _57   Endocrine: Diabetes _58 ; Thyroid dysfunction_59    Past Medical History:  Diagnosis Date  . Anxiety   . CAD (coronary artery disease)   . Gout   . Hyperlipidemia   . Hypertension 05/14/2012    Lexiscan-- EF 51% ,LV normal  . Hypothyroid   . [place]  . Pericardial effusion 12/2008   Dr HRoxan Hockeyperformed a subxiphoid window removing 2040mof fluid  . Pericardial effusion 03/09/2010   Echo-LVEF >55%, very small pericardial effusion ,,Stage 1 (impaired ) diastolic fxn, elevated LV filling  . Pericarditis   . Raynaud's phenomenon   . Scleroderma ([facility]  . Vitiligo     Current Outpatient Medications  Medication Sig Dispense  Refill  . allopurinol (ZYLOPRIM) 100 MG tablet TAKE 1 TABLET BY MOUTH EVERY DAY 30 tablet 2  . ALPRAZolam (XANAX) 0.5 MG tablet Take 0.5 mg by mouth every 6 (six) hours as needed.    [name] amLODipine (NORVASC) 10 MG tablet Take 10 mg by mouth daily.    [name] aspirin 81 MG chewable tablet Chew 81 mg by mouth See admin instructions. Pt taking once a week sometimes due to causing nose bleeds    . colchicine 0.6 MG tablet Take 1 tablet (0.6 mg total) by mouth daily as needed. 30 tablet 2  . ferrous sulfate 325  (65 FE) MG EC tablet Take 325 mg by mouth every other day.     . fluticasone (FLONASE) 50 MCG/ACT nasal spray Place 2 sprays into both nostrils daily as needed for allergies.   11  . HYDROcodone-acetaminophen (VICODIN) 5-500 MG per tablet Take 1 tablet by mouth every 6 (six) hours as needed for pain.     . isosorbide mononitrate (IMDUR) 60 MG 24 hr tablet TAKE 1/2 TABLET BY MOUTH EVERY DAY 30 tablet 11  . lisinopril (PRINIVIL,ZESTRIL) 40 MG tablet TAKE 1 TABLET BY MOUTH EVERYDAY AT BEDTIME  11  . nebivolol (BYSTOLIC) 10 MG tablet Take 1 tablet (10 mg total) by mouth daily. DUE FOR OV 30 tablet 1  . nitroGLYCERIN (NITROSTAT) 0.4 MG SL tablet Place 1 tablet (0.4 mg total) under the tongue every 5 (five) minutes as needed. 25 tablet 3  . pantoprazole (PROTONIX) 40 MG tablet TAKE 1 TABLET (40 MG TOTAL) BY MOUTH DAILY. 30 tablet 8  . potassium chloride (K-DUR) 10 MEQ tablet Take 1 tablet (10 mEq total) by mouth daily. 20 mEq daily 2 days then 10 meq daily thereafter 32 tablet 3  . simvastatin (ZOCOR) 40 MG tablet Take 1 tablet (40 mg total) by mouth daily at 6 PM. (Patient taking differently: Take 40 mg by mouth every morning. ) 30 tablet 9  . spironolactone (ALDACTONE) 25 MG tablet TAKE 1 TABLET BY MOUTH EVERY DAY IN THE MORNING  11  . torsemide (DEMADEX) 20 MG tablet TAKE 1 TABLET BY MOUTH EVERY DAY IN THE MORNING 33 tablet 6   No current facility-administered medications for this encounter.     No Known Allergies    Social History   Socioeconomic History  . Marital status: Married    Spouse name: [name]  . Number of children: 6  . Years of education: 44  . Highest education level: Not on file  Occupational History  . Occupation: Development worker, community  Social Needs  . Financial resource strain: Not on file  . Food insecurity:    Worry: Not on file    Inability: Not on file  . Transportation needs:    Medical: Not on file    Non-medical: Not on file  Tobacco Use  . Smoking status:  Current Every Day Smoker    Packs/day: 1.00    Years: 38.00    Pack years: 38.00    Types: Cigarettes    Start date: 10/21/1971  . Smokeless tobacco: Never Used  Substance and Sexual Activity  . Alcohol use: Yes    Alcohol/week: 2.0 standard drinks    Types: 2 Cans of beer per week    Comment: his drinking has decreased  . Drug use: No  . Sexual activity: Yes    Partners: Female  Lifestyle  . Physical activity:    Days per week: Not on file  Minutes per session: Not on file  . Stress: Not on file  Relationships  . Social connections:    Talks on phone: Not on file    Gets together: Not on file    Attends religious service: Not on file    Active member of club or organization: Not on file    Attends meetings of clubs or organizations: Not on file    Relationship status: Not on file  . Intimate partner violence:    Fear of current or ex partner: Not on file    Emotionally abused: Not on file    Physically abused: Not on file    Forced sexual activity: Not on file  Other Topics Concern  . Not on file  Social History Narrative   Lives with his wife and one daughter.      Family History  Problem Relation Age of Onset  . Lupus Mother   . Autoimmune disease Sister     Vitals:   07/01/18 1026  BP: 138/84  Weight: 56.3 kg (124 lb 3.2 oz)    PHYSICAL EXAM: General:  Thin weak appearing. No respiratory difficulty HEENT: normal Neck: supple. JVP 8-9 Carotids 2+ bilat; no bruits. No lymphadenopathy or thryomegaly appreciated. Cor: PMI nondisplaced. Regular rate & rhythm. Soft TR. Loud P2. + RV lift.  Lungs: clear Abdomen: soft, nontender, nondistended. No hepatosplenomegaly. No bruits or masses. Good bowel sounds. Extremities: no cyanosis, clubbing, rash, edema. Diffuse scleroderma candidate Neuro: alert & oriented x 3, cranial nerves grossly intact. moves all 4 extremities w/o difficulty. Affect pleasant.  ECG: NSR 66. Low volts. Diffuse TWI Personally  reviewed   ASSESSMENT & PLAN:  1. Pulmonary HTN with recurrent large pericardial effusion - almost certainly due to his scleroderma and related PAH (WHO Group I) - will need R/L heart cath followed by initiation of selective pulmonary artery vasodilators - Will also need to discuss with Rheumatology regarding need for treatment of underlying CTD - Hard to follow O2 sats with peripheral changes but will need to ensure adequate oxygenation   2. 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 CAD - s/p previous RCA stent. Stable. No obvious ischemia - will re-look coronaries during RHC to make sure that RCA ischemia not contributing to RV dysfunction  4. Scleroderma - needs f/u with Rheum as above   5. Tobacco use - need for cessation discussed.  Glori Bickers, MD  10:41 AM

## 2018-07-01 NOTE — Patient Instructions (Addendum)
Labs done today   You are scheduled for a Cardiac Catheterization on TODAY  1. Please arrive at the [facility] (Main Entrance A) at [facility]: [address] [place], [place] [address] at [place] (This time is two hours before your procedure to ensure your preparation). Free valet parking service is available.   Special note: Every effort is made to have your procedure done on time. Please understand that emergencies sometimes delay scheduled procedures.  2. Diet: Do not eat solid foods after midnight.  The patient may have clear liquids until 5am upon the day of the procedure.  3. Labs: Done today  4. Medication instructions in preparation for your procedure:   Contrast Allergy: No  DO NOT TAKE TORSEMIDE AND SPIRONOLACTONE Friday AM  On the morning of your procedure, take your Aspirin and any morning medicines NOT listed above.  You may use sips of water.  5. Plan for one night stay--bring personal belongings. 6. Bring a current list of your medications and current insurance cards. 7. You MUST have a responsible person to drive you home. 8. Someone MUST be with you the first 24 hours after you arrive home or your discharge will be delayed. 9. Please wear clothes that are easy to get on and off and wear slip-on shoes.  Thank you for allowing [place] to care for you!   -- [facility]

## 2018-07-01 NOTE — H&P (View-Only) (Signed)
ADVANCED HF CLINIC CONSULT NOTE  Referring Physician: [name]  Primary Care: [name]: [name] [name]  HPI:   [name] is a 58 y.o.male with h/o scleroderma (diagnosed early 2000s), tobacco abuse, HTN, CAD s/p MI (s/p dRCA stent 2010) and previous pericardial effusion s/p pericardial window in 2010 by Dr. [name] who is referred by Dr. [name] due to recurrent pericardial effusion.   2-D EGBT51/7/61YWVPXTGG normal LV systolic function, grade 2 diastolic dysfunction with moderate pericardial effusion without evidence of pericardial tamponade.  He was seen in [facility] office on 01/15/2018 due to increasing shortness of breath. 2D echo showed normal LV systolic function with small nonhemodynamically significant pericardial effusion unchanged from last echo. Myoview stress test was normal as well. His hydrochlorthiazide was changed to torsemide.   He saw Dr. [name] in February with worsening SOB and echo done 06/13/18 showed LVEF 40-45% (I felt EF 50-55%)with severe RV dysfunction, RVSP > 155mHG and large pericardial effusion. His diuretics were increased. He has subsequently seen Dr. HRoxan Hockeywho felt his effusion was likely related to his scleroderma and felt he would benefit from medical therapy prior to repeat surgical pericardia drainage which would require VATS.   CXR 06/11/18: Bilateral pleural effusions. No mass  Feels very weak. Can only walk a few feet before getting SOB. Edema has improved with increased diuretics. Has severe cough and gets dizzy when he coughs. Pain in fingers from scleroderma   Was smoking 1ppd until recently. Now about 1/2 ppd.   Review of Systems: [y] = yes, _0  = no   General: Weight gain _1 ; Weight loss _2 ; Anorexia _3 ; Fatigue _4 ; Fever _5 ; Chills _6 ; Weakness _7   Cardiac: Chest pain/pressure _8 ; Resting SOB _9 ; Exertional SOB _10 ; Orthopnea _11 ; Pedal Edema _12 ; Palpitations _13 ; Syncope _14 ; Presyncope _15 ;  Paroxysmal nocturnal dyspnea_16   Pulmonary: Cough _17 ; Wheezing_18 ; Hemoptysis_19 ; Sputum _20 ; Snoring _21   GI: Vomiting_22 ; Dysphagia_23 ; Melena_24 ; Hematochezia _25 ; Heartburn_26 ; Abdominal pain _27 ; Constipation _28 ; Diarrhea _29 ; BRBPR _30   GU: Hematuria_31 ; Dysuria _32 ; Nocturia_33   Vascular: Pain in legs with walking _34 ; Pain in feet with lying flat _35 ; Non-healing sores _36 ; Stroke _37 ; TIA _38 ; Slurred speech _39 ;  Neuro: Headaches_40 ; Vertigo_41 ; Seizures_42 ; Paresthesias_43 ;Blurred vision _44 ; Diplopia _45 ; Vision changes _46   Ortho/Skin: Arthritis _47 ; Joint pain _48 ; Muscle pain _49 ; Joint swelling _50 ; Back Pain _51 ; Rash _52   Psych: Depression_53 ; Anxiety_54   Heme: Bleeding problems _55 ; Clotting disorders _56 ; Anemia _57   Endocrine: Diabetes _58 ; Thyroid dysfunction_59    Past Medical History:  Diagnosis Date  . Anxiety   . CAD (coronary artery disease)   . Gout   . Hyperlipidemia   . Hypertension 05/14/2012    Lexiscan-- EF 51% ,LV normal  . Hypothyroid   . [place]  . Pericardial effusion 12/2008   Dr HRoxan Hockeyperformed a subxiphoid window removing 2040mof fluid  . Pericardial effusion 03/09/2010   Echo-LVEF >55%, very small pericardial effusion ,,Stage 1 (impaired ) diastolic fxn, elevated LV filling  . Pericarditis   . Raynaud's phenomenon   . Scleroderma ([facility]  . Vitiligo     Current Outpatient Medications  Medication Sig Dispense  Refill  . allopurinol (ZYLOPRIM) 100 MG tablet TAKE 1 TABLET BY MOUTH EVERY DAY 30 tablet 2  . ALPRAZolam (XANAX) 0.5 MG tablet Take 0.5 mg by mouth every 6 (six) hours as needed.    [name] amLODipine (NORVASC) 10 MG tablet Take 10 mg by mouth daily.    [name] aspirin 81 MG chewable tablet Chew 81 mg by mouth See admin instructions. Pt taking once a week sometimes due to causing nose bleeds    . colchicine 0.6 MG tablet Take 1 tablet (0.6 mg total) by mouth daily as needed. 30 tablet 2  . ferrous sulfate 325  (65 FE) MG EC tablet Take 325 mg by mouth every other day.     . fluticasone (FLONASE) 50 MCG/ACT nasal spray Place 2 sprays into both nostrils daily as needed for allergies.   11  . HYDROcodone-acetaminophen (VICODIN) 5-500 MG per tablet Take 1 tablet by mouth every 6 (six) hours as needed for pain.     . isosorbide mononitrate (IMDUR) 60 MG 24 hr tablet TAKE 1/2 TABLET BY MOUTH EVERY DAY 30 tablet 11  . lisinopril (PRINIVIL,ZESTRIL) 40 MG tablet TAKE 1 TABLET BY MOUTH EVERYDAY AT BEDTIME  11  . nebivolol (BYSTOLIC) 10 MG tablet Take 1 tablet (10 mg total) by mouth daily. DUE FOR OV 30 tablet 1  . nitroGLYCERIN (NITROSTAT) 0.4 MG SL tablet Place 1 tablet (0.4 mg total) under the tongue every 5 (five) minutes as needed. 25 tablet 3  . pantoprazole (PROTONIX) 40 MG tablet TAKE 1 TABLET (40 MG TOTAL) BY MOUTH DAILY. 30 tablet 8  . potassium chloride (K-DUR) 10 MEQ tablet Take 1 tablet (10 mEq total) by mouth daily. 20 mEq daily 2 days then 10 meq daily thereafter 32 tablet 3  . simvastatin (ZOCOR) 40 MG tablet Take 1 tablet (40 mg total) by mouth daily at 6 PM. (Patient taking differently: Take 40 mg by mouth every morning. ) 30 tablet 9  . spironolactone (ALDACTONE) 25 MG tablet TAKE 1 TABLET BY MOUTH EVERY DAY IN THE MORNING  11  . torsemide (DEMADEX) 20 MG tablet TAKE 1 TABLET BY MOUTH EVERY DAY IN THE MORNING 33 tablet 6   No current facility-administered medications for this encounter.     No Known Allergies    Social History   Socioeconomic History  . Marital status: Married    Spouse name: [name]  . Number of children: 6  . Years of education: 44  . Highest education level: Not on file  Occupational History  . Occupation: Development worker, community  Social Needs  . Financial resource strain: Not on file  . Food insecurity:    Worry: Not on file    Inability: Not on file  . Transportation needs:    Medical: Not on file    Non-medical: Not on file  Tobacco Use  . Smoking status:  Current Every Day Smoker    Packs/day: 1.00    Years: 38.00    Pack years: 38.00    Types: Cigarettes    Start date: 10/21/1971  . Smokeless tobacco: Never Used  Substance and Sexual Activity  . Alcohol use: Yes    Alcohol/week: 2.0 standard drinks    Types: 2 Cans of beer per week    Comment: his drinking has decreased  . Drug use: No  . Sexual activity: Yes    Partners: Female  Lifestyle  . Physical activity:    Days per week: Not on file  Minutes per session: Not on file  . Stress: Not on file  Relationships  . Social connections:    Talks on phone: Not on file    Gets together: Not on file    Attends religious service: Not on file    Active member of club or organization: Not on file    Attends meetings of clubs or organizations: Not on file    Relationship status: Not on file  . Intimate partner violence:    Fear of current or ex partner: Not on file    Emotionally abused: Not on file    Physically abused: Not on file    Forced sexual activity: Not on file  Other Topics Concern  . Not on file  Social History Narrative   Lives with his wife and one daughter.      Family History  Problem Relation Age of Onset  . Lupus Mother   . Autoimmune disease Sister     Vitals:   07/01/18 1026  BP: 138/84  Weight: 56.3 kg (124 lb 3.2 oz)    PHYSICAL EXAM: General:  Thin weak appearing. No respiratory difficulty HEENT: normal Neck: supple. JVP 8-9 Carotids 2+ bilat; no bruits. No lymphadenopathy or thryomegaly appreciated. Cor: PMI nondisplaced. Regular rate & rhythm. Soft TR. Loud P2. + RV lift.  Lungs: clear Abdomen: soft, nontender, nondistended. No hepatosplenomegaly. No bruits or masses. Good bowel sounds. Extremities: no cyanosis, clubbing, rash, edema. Diffuse scleroderma candidate Neuro: alert & oriented x 3, cranial nerves grossly intact. moves all 4 extremities w/o difficulty. Affect pleasant.  ECG: NSR 66. Low volts. Diffuse TWI Personally  reviewed   ASSESSMENT & PLAN:  1. Pulmonary HTN with recurrent large pericardial effusion - almost certainly due to his scleroderma and related PAH (WHO Group I) - will need R/L heart cath followed by initiation of selective pulmonary artery vasodilators - Will also need to discuss with Rheumatology regarding need for treatment of underlying CTD - Hard to follow O2 sats with peripheral changes but will need to ensure adequate oxygenation   2. 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 CAD - s/p previous RCA stent. Stable. No obvious ischemia - will re-look coronaries during RHC to make sure that RCA ischemia not contributing to RV dysfunction  4. Scleroderma - needs f/u with Rheum as above   5. Tobacco use - need for cessation discussed.  Glori Bickers, MD  10:41 AM

## 2018-07-01 NOTE — Interval H&P Note (Signed)
History and Physical Interval Note:  07/01/2018 1:35 PM  [name]  has presented today for surgery, with the diagnosis of hypertension.  The various methods of treatment have been discussed with the patient and family. After consideration of risks, benefits and other options for treatment, the patient has consented to  Procedure(s): RIGHT/LEFT HEART CATH AND CORONARY ANGIOGRAPHY (N/A) possible percutaneous coronary angioplasty as a surgical intervention.  [name] history has been reviewed, patient examined, no change in status, stable for surgery.  I have reviewed the patient's chart and labs.  Questions were answered to the patient's satisfaction.     Clotile Whittington

## 2018-07-02 ENCOUNTER — Encounter (HOSPITAL_COMMUNITY): Payer: Self-pay | Admitting: Internal Medicine

## 2018-07-03 ENCOUNTER — Telehealth (HOSPITAL_COMMUNITY): Payer: Self-pay | Admitting: *Deleted

## 2018-07-03 MED ORDER — MACITENTAN 10 MG PO TABS: 10.0000 mg | ORAL_TABLET | Freq: Every day | ORAL | Status: DC

## 2018-07-03 NOTE — Telephone Encounter (Signed)
Per Dr Haroldine Laws pt needs Sildenafil 20 mg TID and Opsumit 10 mg daily.  He sent in a rx for Sildenafil after cath.  Pt also needs f/u appt 4-6 weeks.  Spoke w/pt and wife, they are aware and did get rx for Sildenafil.  Pt will come by our office tomorrow to sign the Opsumit enrollment form.  F/u appt sch for 4/29.

## 2018-07-05 ENCOUNTER — Other Ambulatory Visit: Payer: Self-pay | Admitting: Rheumatology

## 2018-07-05 ENCOUNTER — Other Ambulatory Visit: Payer: Self-pay | Admitting: Cardiovascular Disease

## 2018-07-05 NOTE — Telephone Encounter (Signed)
Last Visit: 06/24/18 Next Visit: 08/09/18 Labs: 07/01/18 RBC 3.52, Hgb 10.5 Hct 33.5, RDW 16.5 Creat. 2.00 Calcium 8.0 GFR 41 BUN 42 Potassium 3.1  Okay to refill Allopurinol?

## 2018-07-08 NOTE — Telephone Encounter (Signed)
ok

## 2018-07-10 ENCOUNTER — Ambulatory Visit: Payer: 59 | Admitting: Adult Health

## 2018-07-10 ENCOUNTER — Telehealth (HOSPITAL_COMMUNITY): Payer: Self-pay | Admitting: Licensed Clinical Social Worker

## 2018-07-10 ENCOUNTER — Other Ambulatory Visit (HOSPITAL_COMMUNITY): Payer: Self-pay | Admitting: Cardiology

## 2018-07-10 MED ORDER — MACITENTAN 10 MG PO TABS: 10.0000 mg | ORAL_TABLET | Freq: Every day | ORAL | 3 refills | Status: DC

## 2018-07-10 NOTE — Telephone Encounter (Signed)
rx printed to accompany patient assistance application

## 2018-07-10 NOTE — Telephone Encounter (Signed)
application for Opsumit assistance completed and sent in for review- fax confirmation received  [name], [place] Worker [facility] [phone]

## 2018-07-15 ENCOUNTER — Telehealth (HOSPITAL_COMMUNITY): Payer: Self-pay

## 2018-07-15 NOTE — Telephone Encounter (Signed)
Received fax stating that pt has been sent 30 day free trial of Opsumit.   Prior authorization through [place] was initiated for Opsumit medication and sent via [place] on 07/15/2018.

## 2018-07-15 NOTE — Telephone Encounter (Signed)
Prior authorization through [employer] was APPROVED for [place]  and will expire on 03/[id number].

## 2018-07-16 NOTE — Addendum Note (Signed)
Addended by: Leland Johns A on: 07/16/2018 08:30 AM   Modules accepted: Orders

## 2018-07-19 DIAGNOSIS — N183 Chronic kidney disease, stage 3 (moderate): Secondary | ICD-10-CM | POA: Diagnosis not present

## 2018-07-19 DIAGNOSIS — N049 Nephrotic syndrome with unspecified morphologic changes: Secondary | ICD-10-CM | POA: Diagnosis not present

## 2018-07-19 NOTE — Telephone Encounter (Signed)
Received fax that a prior authorization was cancelled by [employer] because a previous prior authorization has been complete. The pa# is [id number]

## 2018-07-26 DIAGNOSIS — I10 Essential (primary) hypertension: Secondary | ICD-10-CM | POA: Diagnosis not present

## 2018-07-26 DIAGNOSIS — N183 Chronic kidney disease, stage 3 (moderate): Secondary | ICD-10-CM | POA: Diagnosis not present

## 2018-07-26 DIAGNOSIS — R809 Proteinuria, unspecified: Secondary | ICD-10-CM | POA: Diagnosis not present

## 2018-07-29 ENCOUNTER — Telehealth: Payer: Self-pay | Admitting: Cardiovascular Disease

## 2018-07-29 NOTE — Telephone Encounter (Signed)
I called patient, spoke with wife. She states that patient went out with his friends on Saturday to 'ride around', and states that he has begun to not have severe swelling in his legs, and labored breathing. It has become worse since Saturday. He had a syncope episode on Saturday - that caused him to be dizzy, racing heart rate and sweating before he fell out. They are unable to get a BP or HR at this time, he denies fever, no contact with anyone who has traveled to hot spots, or been in contact with anyone with possible COVID 19, denies chest pain.   Patient had a cath on 03/09, was placed on two new medications by Dr.Bensimon- sildenafil and macitentan.   Patient has torsemide 20 mg daily on list- which he is taking, but due to recent kidney levels I wanted to verify with preop PA on how to proceed with patients dose or if he should be seen.   Please advise, thank you!

## 2018-07-29 NOTE — Telephone Encounter (Signed)
I called [name] 3 hrs after the extra torsemide was taken. He is feeling better. I advised his wife to take an extra torsemide tomorrow and Wed and let us know how he is doing. Will hold off on scheduling an in-patient visit at this time. Reviewed when to call back and when to visit the ER. She expressed understanding of the plan.

## 2018-07-29 NOTE — Telephone Encounter (Signed)
Pt c/o swelling: STAT is pt has developed SOB within 24 hours  1) How much weight have you gained and in what time span? 9 pounds 2 days.  2) If swelling, where is the swelling located? All over mostly legs.  3) Are you currently taking a fluid pill? Not sure.  4) Are you currently SOB? Yes   5) Do you have a log of your daily weights (if so, list)? No   6) Have you gained 3 pounds in a day or 5 pounds in a week? Yes   7) Have you traveled recently? No

## 2018-07-29 NOTE — Telephone Encounter (Signed)
Contacted by Triage for patient callback. I spoke with the patient's wife who states he has gained approximately 10 lbs since Saturday 07/27/18. His dry weight is 120 lbs and he now weighs 130 lbs with increased SOB, worsening orthopnea, and lower extremity edema. He can't walk to the bathroom from the living room without getting short of breath. She also states his "fluid cough" is worse than normal. He is afebrile and has not had any contact with a known COVID-19 patient.   Symptoms seem to have started on Saturday.  He went for a drive with friends on Sat and after that had an episode of dizziness, sweating, and heart racing that lasted 1-2 min. No chest pain.  She states they also had [employer] Saturday night.   She states that he gets dizzy if he stands up too quickly. They do not have a BP cuff at home. It is difficult to figure out what medications he has taken this morning, but states he has taken all of his "usual medications."  He had a phone evisit with is nephrologist on Friday 07/26/18 and was stable at that time, no medication changes.  The pt's wife is concerned about his pericardial effusion. We discussed his weight gain, orthopnea, and lower extremity swelling suggest increased fluid because from his heart failure in the setting of fast food Saturday night, rather than pericardial effusion. I asked him to take an extra torsemide this morning and I would touch base with Dr. Harrell Gave (DOD) and Dr. Clayborne Dana office.   Case discussed with Dr. Harrell Gave. She has an in-patient slot at 3:20pm, but would like for me to touch base with the AHF clinic first.

## 2018-08-01 ENCOUNTER — Other Ambulatory Visit: Payer: Self-pay | Admitting: Cardiovascular Disease

## 2018-08-01 NOTE — Telephone Encounter (Signed)
Bystolic refilled.

## 2018-08-09 ENCOUNTER — Ambulatory Visit: Payer: 59 | Admitting: Rheumatology

## 2018-08-12 ENCOUNTER — Other Ambulatory Visit: Payer: Self-pay

## 2018-08-12 ENCOUNTER — Ambulatory Visit (HOSPITAL_COMMUNITY)
Admission: RE | Admit: 2018-08-12 | Discharge: 2018-08-12 | Disposition: A | Discharge status: Home / Self Care | Payer: 59 | Source: Ambulatory Visit | Attending: Adult Health | Admitting: Adult Health

## 2018-08-12 ENCOUNTER — Telehealth (HOSPITAL_COMMUNITY): Payer: Self-pay | Admitting: *Deleted

## 2018-08-12 DIAGNOSIS — I50811 Acute right heart failure: Secondary | ICD-10-CM

## 2018-08-12 DIAGNOSIS — I272 Pulmonary hypertension, unspecified: Secondary | ICD-10-CM | POA: Diagnosis not present

## 2018-08-12 DIAGNOSIS — I50810 Right heart failure, unspecified: Secondary | ICD-10-CM

## 2018-08-12 DIAGNOSIS — Z9861 Coronary angioplasty status: Secondary | ICD-10-CM

## 2018-08-12 DIAGNOSIS — I2510 Atherosclerotic heart disease of native coronary artery without angina pectoris: Secondary | ICD-10-CM

## 2018-08-12 DIAGNOSIS — Z720 Tobacco use: Secondary | ICD-10-CM
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
## 2018-08-12 NOTE — Telephone Encounter (Signed)
Pt's wife called very concerned about pt, she states he has gained about 10 lbs in past 2 weeks, she states he increased Torsemide on 4/6 per PA with Dr Kennon Holter office and that worked but for those 3 days and then he went back to his 20 mg daily and has been gaining wt since.  She states he is SOB moving from room to room in the house, he has swelling "from waist down" she states this improved overnight and returns once he is up and moving around for the day.  Sch telehealth visit with Lillia Mountain, NP she can make a plan from that visit, offered appt for now but she states pt is sleeping and request the call be after lunch.

## 2018-08-12 NOTE — Progress Notes (Signed)
Spoke with patients wife, instructions reviewed with her.  Pt to be scheduled for an appointment tomorrow. States understanding.  She reports they do not need any refills at this time.

## 2018-08-12 NOTE — Patient Instructions (Addendum)
1. Take additional 60 mg torsemide and 40 meq K today   2. Appointment for tomorrow for possible IV lasix or admit to hospital for Right Heart Catheterization. Labs to be drawn at visit.

## 2018-08-13 ENCOUNTER — Encounter (HOSPITAL_COMMUNITY): Payer: Self-pay

## 2018-08-13 ENCOUNTER — Ambulatory Visit (HOSPITAL_COMMUNITY)
Admission: RE | Admit: 2018-08-13 | Discharge: 2018-08-13 | Disposition: A | Discharge status: Home / Self Care | Payer: 59 | Source: Ambulatory Visit | Attending: Cardiology | Admitting: Cardiology

## 2018-08-13 ENCOUNTER — Other Ambulatory Visit: Payer: Self-pay

## 2018-08-13 VITALS — BP 114/72 | Wt 127.8 lb

## 2018-08-13 DIAGNOSIS — R05 Cough: Secondary | ICD-10-CM | POA: Diagnosis not present

## 2018-08-13 DIAGNOSIS — Z79899 Other long term (current) drug therapy: Secondary | ICD-10-CM | POA: Insufficient documentation

## 2018-08-13 DIAGNOSIS — I2510 Atherosclerotic heart disease of native coronary artery without angina pectoris: Secondary | ICD-10-CM | POA: Diagnosis not present

## 2018-08-13 DIAGNOSIS — J90 Pleural effusion, not elsewhere classified: Secondary | ICD-10-CM | POA: Insufficient documentation

## 2018-08-13 DIAGNOSIS — Z955 Presence of coronary angioplasty implant and graft: Secondary | ICD-10-CM | POA: Insufficient documentation

## 2018-08-13 DIAGNOSIS — I313 Pericardial effusion (noninflammatory): Secondary | ICD-10-CM | POA: Diagnosis not present

## 2018-08-13 DIAGNOSIS — E039 Hypothyroidism, unspecified: Secondary | ICD-10-CM | POA: Insufficient documentation

## 2018-08-13 DIAGNOSIS — Z7982 Long term (current) use of aspirin: Secondary | ICD-10-CM | POA: Diagnosis not present

## 2018-08-13 DIAGNOSIS — I110 Hypertensive heart disease with heart failure: Secondary | ICD-10-CM | POA: Insufficient documentation

## 2018-08-13 DIAGNOSIS — M109 Gout, unspecified: Secondary | ICD-10-CM | POA: Insufficient documentation

## 2018-08-13 DIAGNOSIS — E785 Hyperlipidemia, unspecified: Secondary | ICD-10-CM | POA: Diagnosis not present

## 2018-08-13 DIAGNOSIS — M349 Systemic sclerosis, unspecified: Secondary | ICD-10-CM | POA: Diagnosis not present

## 2018-08-13 DIAGNOSIS — I252 Old myocardial infarction: Secondary | ICD-10-CM | POA: Diagnosis not present

## 2018-08-13 DIAGNOSIS — I50810 Right heart failure, unspecified: Secondary | ICD-10-CM | POA: Insufficient documentation

## 2018-08-13 DIAGNOSIS — I272 Pulmonary hypertension, unspecified: Secondary | ICD-10-CM | POA: Diagnosis not present

## 2018-08-13 DIAGNOSIS — F419 Anxiety disorder, unspecified: Secondary | ICD-10-CM | POA: Diagnosis not present

## 2018-08-13 DIAGNOSIS — Z7901 Long term (current) use of anticoagulants: Secondary | ICD-10-CM | POA: Diagnosis not present

## 2018-08-13 DIAGNOSIS — F17210 Nicotine dependence, cigarettes, uncomplicated: Secondary | ICD-10-CM | POA: Insufficient documentation

## 2018-08-13 DIAGNOSIS — Z9861 Coronary angioplasty status: Secondary | ICD-10-CM

## 2018-08-13 DIAGNOSIS — F17200 Nicotine dependence, unspecified, uncomplicated: Secondary | ICD-10-CM

## 2018-08-13 LAB — BASIC METABOLIC PANEL
Anion gap: 12 (ref 5–15)
BUN: 58 mg/dL — ABNORMAL HIGH (ref 6–20)
CO2: 18 mmol/L — ABNORMAL LOW (ref 22–32)
Calcium: 8.4 mg/dL — ABNORMAL LOW (ref 8.9–10.3)
Chloride: 111 mmol/L (ref 98–111)
Creatinine, Ser: 2.73 mg/dL — ABNORMAL HIGH (ref 0.61–1.24)
GFR calc Af Amer: 28 mL/min — ABNORMAL LOW (ref >60)
GFR calc non Af Amer: 24 mL/min — ABNORMAL LOW (ref >60)
Glucose, Bld: 97 mg/dL (ref 70–99)
Potassium: 4 mmol/L (ref 3.5–5.1)
Sodium: 141 mmol/L (ref 135–145)

## 2018-08-13 LAB — BRAIN NATRIURETIC PEPTIDE: B Natriuretic Peptide: 359.8 pg/mL — ABNORMAL HIGH (ref 0.0–100.0)

## 2018-08-13 MED ORDER — TORSEMIDE 20 MG PO TABS: 40.0000 mg | ORAL_TABLET | Freq: Every day | ORAL | 3 refills | Status: DC

## 2018-08-13 MED ORDER — POTASSIUM CHLORIDE ER 20 MEQ PO TBCR: 20.0000 meq | EXTENDED_RELEASE_TABLET | Freq: Every day | ORAL | 3 refills | Status: DC

## 2018-08-13 NOTE — Progress Notes (Signed)
ADVANCED HF CLINIC NOTE  Referring Physician: [name]  Primary Care: [name]: [name] [name]  HPI:   [name] is a 58 y.o.male with h/o scleroderma (diagnosed early 2000s), tobacco abuse, HTN, CAD s/p MI (s/p dRCA stent 2010) and previous pericardial effusion s/p pericardial window in 2010 by Dr. [name] who is referred by Dr. [name] due to recurrent pericardial effusion.   2-D [id number] normal LV systolic function, grade 2 diastolic dysfunction with moderate pericardial effusion without evidence of pericardial tamponade.  He was seen in [facility] office on 01/15/2018 due to increasing shortness of breath. 2D echo showed normal LV systolic function with small nonhemodynamically significant pericardial effusion unchanged from last echo. Myoview stress test was normal as well. His hydrochlorthiazide was changed to torsemide.   He saw Dr. [name] in February with worsening SOB and echo done 06/13/18 showed LVEF 40-45% (I felt EF 50-55%)with severe RV dysfunction, RVSP > 150mHG and large pericardial effusion. His diuretics were increased. He has subsequently seen Dr. HRoxan Hockeywho felt his effusion was likely related to his scleroderma and felt he would benefit from medical therapy prior to repeat surgical pericardia drainage which would require VATS.   CXR 06/11/18: Bilateral pleural effusions. No mass  Seen in HF clinic for first time 07/01/18. [facility] to [facility]. Started on sildenafil and opsumit.  [facility] with 10 lb weight gain and SOB. Advised to take additional 20 mg torsemide x 3 days.   He had a virtual visit yesterday with me. Weight was up 10 lbs on home scale with pitting edema into thighs and abdomen. He was instructed to take additional 60 mg torsemide and presents today for follow up. Overall doing "100% better" today. SOB is much improved. He was able to walk into clinic slowly  without SOB. He urinated a lot last night and says legs were back to normal this morning, but swelling has come back this afternoon. Weight was down 5 lbs overnight, now 122 lbs on home scale. No orthopnea or PND. No further dizziness on exertion. Took all medications today. I spoke to his wife on the phone and she confirms that swelling was gone this morning but built back up. Cough has been better, but still present.   R/LHC 07/01/18  Prox RCA to Mid RCA lesion is 40% stenosed.  Mid RCA lesion is 40% stenosed.  Previously placed Mid RCA to Dist RCA stent (unknown type) is widely patent.  Acute Mrg lesion is 95% stenosed.  Prox LAD lesion is 30% stenosed.  Mid LAD to Dist LAD lesion is 40% stenosed. Findings: Ao = 140/77 (100) LV = 145/14 RA = 16 RV = 79/18 PA = 81/26 (48) PCW = 17 Fick cardiac output/index = 4.6/2.7 PVR = 6.0 WU Ao sat = 97% PA sat = 61%, 64% No RV-LV interaction on simultaneous pressure waveforms with deep breathing Assessment: 1. Non-obstructive CAD with patent RCA stent 2. Moderate to severe PAH 3. No evidence of tamponade   Past Medical History:  Diagnosis Date  . Anxiety   . CAD (coronary artery disease)   . Gout   . Hyperlipidemia   . Hypertension 05/14/2012    Lexiscan-- EF 51% ,LV normal  . Hypothyroid   . [place]  . Pericardial effusion 12/2008   Dr HRoxan Hockeyperformed a subxiphoid window removing 2025mof fluid  . Pericardial effusion 03/09/2010   Echo-LVEF >55%, very small  pericardial effusion ,,Stage 1 (impaired ) diastolic fxn, elevated LV filling  . Pericarditis   . Raynaud's phenomenon   . Scleroderma ([place])   . Vitiligo     Current Outpatient Medications  Medication Sig Dispense Refill  . allopurinol (ZYLOPRIM) 100 MG tablet TAKE 1 TABLET BY MOUTH EVERY DAY 30 tablet 2  . ALPRAZolam (XANAX) 0.5 MG tablet Take 0.5 mg by mouth every 6 (six) hours as needed.    [name] amLODipine (NORVASC) 10 MG tablet Take  10 mg by mouth daily.    [name] aspirin 81 MG chewable tablet Chew 81 mg by mouth See admin instructions. Pt taking once a week sometimes due to causing nose bleeds    . BYSTOLIC 10 MG tablet TAKE 1 TABLET (10 MG TOTAL) BY MOUTH DAILY. DUE FOR OV 30 tablet 2  . colchicine 0.6 MG tablet Take 1 tablet (0.6 mg total) by mouth daily as needed. 30 tablet 2  . ferrous sulfate 325 (65 FE) MG EC tablet Take 325 mg by mouth every other day.     . fluticasone (FLONASE) 50 MCG/ACT nasal spray Place 2 sprays into both nostrils daily as needed for allergies.   11  . HYDROcodone-acetaminophen (VICODIN) 5-500 MG per tablet Take 1 tablet by mouth every 6 (six) hours as needed for pain.     . isosorbide mononitrate (IMDUR) 60 MG 24 hr tablet TAKE 1/2 TABLET BY MOUTH EVERY DAY 30 tablet 11  . lisinopril (PRINIVIL,ZESTRIL) 40 MG tablet TAKE 1 TABLET BY MOUTH EVERYDAY AT BEDTIME  11  . macitentan (OPSUMIT) 10 MG tablet Take 1 tablet (10 mg total) by mouth daily. 90 tablet 3  . nitroGLYCERIN (NITROSTAT) 0.4 MG SL tablet Place 1 tablet (0.4 mg total) under the tongue every 5 (five) minutes as needed. 25 tablet 3  . pantoprazole (PROTONIX) 40 MG tablet TAKE 1 TABLET (40 MG TOTAL) BY MOUTH DAILY. 30 tablet 8  . potassium chloride (K-DUR) 10 MEQ tablet Take 1 tablet (10 mEq total) by mouth daily. 20 mEq daily 2 days then 10 meq daily thereafter (Patient taking differently: Take 10 mEq by mouth daily. 10 meq daily thereafter) 32 tablet 3  . sildenafil (REVATIO) 20 MG tablet Take 1 tablet (20 mg total) by mouth 3 (three) times daily. 90 tablet 12  . simvastatin (ZOCOR) 40 MG tablet TAKE 1 TABLET BY MOUTH DAILY AT 6PM 30 tablet 3  . spironolactone (ALDACTONE) 25 MG tablet TAKE 1 TABLET BY MOUTH EVERY DAY IN THE MORNING  11  . torsemide (DEMADEX) 20 MG tablet TAKE 1 TABLET BY MOUTH EVERY DAY IN THE MORNING 33 tablet 6   No current facility-administered medications for this encounter.     No Known Allergies    Social History    Socioeconomic History  . Marital status: Married    Spouse name: [name]  . Number of children: 6  . Years of education: 55  . Highest education level: Not on file  Occupational History  . Occupation: Development worker, community  Social Needs  . Financial resource strain: Not on file  . Food insecurity:    Worry: Not on file    Inability: Not on file  . Transportation needs:    Medical: Not on file    Non-medical: Not on file  Tobacco Use  . Smoking status: Current Every Day Smoker    Packs/day: 1.00    Years: 38.00    Pack years: 38.00    Types: Cigarettes  Start date: 10/21/1971  . Smokeless tobacco: Never Used  Substance and Sexual Activity  . Alcohol use: Yes    Alcohol/week: 2.0 standard drinks    Types: 2 Cans of beer per week    Comment: his drinking has decreased  . Drug use: No  . Sexual activity: Yes    Partners: Female  Lifestyle  . Physical activity:    Days per week: Not on file    Minutes per session: Not on file  . Stress: Not on file  Relationships  . Social connections:    Talks on phone: Not on file    Gets together: Not on file    Attends religious service: Not on file    Active member of club or organization: Not on file    Attends meetings of clubs or organizations: Not on file    Relationship status: Not on file  . Intimate partner violence:    Fear of current or ex partner: Not on file    Emotionally abused: Not on file    Physically abused: Not on file    Forced sexual activity: Not on file  Other Topics Concern  . Not on file  Social History Narrative   Lives with his wife and one daughter.      Family History  Problem Relation Age of Onset  . Lupus Mother   . Autoimmune disease Sister     Vitals:   08/13/18 1226  BP: 114/72  Weight: 58 kg (127 lb 12.8 oz)    Wt Readings from Last 3 Encounters:  08/13/18 58 kg (127 lb 12.8 oz)  07/01/18 56.2 kg (124 lb)  07/01/18 56.3 kg (124 lb 3.2 oz)    PHYSICAL EXAM: General:Thin,  weak appearing. No resp difficulty. HEENT: Normal Neck: Supple. JVP ~10. Carotids 2+ bilat; no bruits. No thyromegaly or nodule noted. Cor: PMI nondisplaced. RRR, soft TR. Loud P2. +RV lift.  Lungs: clear, distant Abdomen: Soft, non-tender, non-distended, no HSM. No bruits or masses. +BS  Extremities: No cyanosis, clubbing, or rash. R and LLE 2+ edema into thighs. Diffuse scleroderma Neuro: Alert & orientedx3, cranial nerves grossly intact. moves all 4 extremities w/o difficulty. Affect pleasant   ASSESSMENT & PLAN:  1. 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 PA 81/26 (48), PCW 17, PVC 6.0 - Need to discuss with Rheumatology regarding need for treatment of underlying CTD - Hard to follow O2 sats with peripheral changes but will need to ensure adequate oxygenation - Continue sildenafil 20 mg TID - Continue opsumit 10 mg daily  2. 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 Volume still mildly elevated, but much improved. - Increase torsemide to 40 mg daily. Increase K sup to 20 meq daily. Take additional 40 mg torsemide with 20 meq K today. Recheck BMET in 1 week - Continue spiro 25 mg daily - Discussed limiting fluid and salt intake.  - Hold off on RHC with improvement in symptoms.   3. CAD - s/p previous RCA stent.  - LHC 07/01/18: Non obstructive CAD with patent RCA stent. - No s/s ischemia.  4. Scleroderma - needs f/u with Rheum. Has not seen recently. Unable to see until after COVID crisis  5. Tobacco use - smoking  3/4 ppd. Encouraged cessation. No change.   Discussed the above medication changes with patient and his wife. Labs today and again in 1 week with diuretic changes. I will follow up with him on Friday via  telehealth.    Georgiana Shore, NP  12:34 PM  Greater than 50% of the 25 minute visit was spent in counseling/coordination of care regarding disease state education, salt/fluid restriction, sliding scale diuretics, and medication compliance.

## 2018-08-13 NOTE — Patient Instructions (Addendum)
Labs were done today. We will call you with any ABNORMAL results. No news is good news!  Please follow up lab work in 1 week, 28 April @ 11:00am  Take an additional 40 mg of torsemide and 20 mEq of potassium TODAY ONLY.  INCREASE daily Torsemide to 40 mg each day.  INCREASE daily Potassium to 20 mEq each day.  You are scheduled for a tele-health visit with [facility] Friday, 24 April @ 1:00pm

## 2018-08-16 ENCOUNTER — Other Ambulatory Visit: Payer: Self-pay

## 2018-08-16 ENCOUNTER — Ambulatory Visit (HOSPITAL_COMMUNITY)
Admission: RE | Admit: 2018-08-16 | Discharge: 2018-08-16 | Disposition: A | Discharge status: Home / Self Care | Payer: 59 | Source: Ambulatory Visit | Attending: Cardiology | Admitting: Cardiology

## 2018-08-16 DIAGNOSIS — I272 Pulmonary hypertension, unspecified: Secondary | ICD-10-CM

## 2018-08-16 DIAGNOSIS — I50810 Right heart failure, unspecified: Secondary | ICD-10-CM

## 2018-08-16 DIAGNOSIS — N183 Chronic kidney disease, stage 3 unspecified: Secondary | ICD-10-CM

## 2018-08-16 DIAGNOSIS — Z720 Tobacco use: Secondary | ICD-10-CM

## 2018-08-16 NOTE — Addendum Note (Signed)
Encounter addended by: Valeda Malm, RN on: 08/16/2018 2:29 PM  Actions taken: Clinical Note Signed
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
## 2018-08-16 NOTE — Patient Instructions (Signed)
Labs scheduled next week.  We will only contact you if something comes back abnormal or we need to make some changes. Otherwise no news is good news!  Your physician recommends that you schedule a follow-up appointment in: 8 weeks with Dr. Haroldine Laws. You will get a call to schedule this appointment.

## 2018-08-16 NOTE — Progress Notes (Signed)
Spoke with wife, discussed AVS.  No questions offered. AVS mailed. Reminded of lab appt for next week.

## 2018-08-20 ENCOUNTER — Ambulatory Visit (HOSPITAL_COMMUNITY)
Admission: RE | Admit: 2018-08-20 | Discharge: 2018-08-20 | Disposition: A | Discharge status: Home / Self Care | Payer: 59 | Source: Ambulatory Visit | Attending: Cardiology | Admitting: Cardiology

## 2018-08-20 ENCOUNTER — Other Ambulatory Visit: Payer: Self-pay

## 2018-08-20 DIAGNOSIS — I50810 Right heart failure, unspecified: Secondary | ICD-10-CM | POA: Insufficient documentation

## 2018-08-20 LAB — BASIC METABOLIC PANEL
Anion gap: 15 (ref 5–15)
BUN: 74 mg/dL — ABNORMAL HIGH (ref 6–20)
CO2: 15 mmol/L — ABNORMAL LOW (ref 22–32)
Calcium: 8.2 mg/dL — ABNORMAL LOW (ref 8.9–10.3)
Chloride: 111 mmol/L (ref 98–111)
Creatinine, Ser: 3.54 mg/dL — ABNORMAL HIGH (ref 0.61–1.24)
GFR calc Af Amer: 21 mL/min — ABNORMAL LOW (ref >60)
GFR calc non Af Amer: 18 mL/min — ABNORMAL LOW (ref >60)
Glucose, Bld: 86 mg/dL (ref 70–99)
Potassium: 4 mmol/L (ref 3.5–5.1)
Sodium: 141 mmol/L (ref 135–145)

## 2018-08-21 ENCOUNTER — Encounter (HOSPITAL_COMMUNITY): Payer: 59 | Admitting: Internal Medicine

## 2018-08-21 ENCOUNTER — Telehealth (HOSPITAL_COMMUNITY): Payer: Self-pay

## 2018-08-21 DIAGNOSIS — I5032 Chronic diastolic (congestive) heart failure: Secondary | ICD-10-CM

## 2018-08-21 MED ORDER — TORSEMIDE 20 MG PO TABS: ORAL_TABLET | ORAL | 3 refills | Status: DC

## 2018-08-21 NOTE — Telephone Encounter (Signed)
-----  Message from Georgiana Shore, NP sent at 08/21/2018  8:31 AM EDT ----- Creatinine is worse. Please call patient and ask him to hold torsemide and potassium for 2 days, then decrease torsemide to 40 mg daily alternating with 20 mg daily. Stop spiro and lisinopril for now. Recheck BMET in 1 week. Thanks

## 2018-08-21 NOTE — Telephone Encounter (Signed)
Spoke to patients wife. She is aware of lab results and instructions by NP.  She wrote everything down and was able to repeat back instructions.  She verbalized understanding of all information.  Lab visit scheduled for Thursday may 7, 1130a

## 2018-08-23 ENCOUNTER — Telehealth (HOSPITAL_COMMUNITY): Payer: Self-pay | Admitting: Cardiology

## 2018-08-23 DIAGNOSIS — I519 Heart disease, unspecified: Secondary | ICD-10-CM

## 2018-08-23 NOTE — Telephone Encounter (Signed)
pts wife aware and voiced understanding Repeat labs changed to Monday 08/26/18 @ 1030

## 2018-08-23 NOTE — Telephone Encounter (Signed)
-----  Message from Georgiana Shore, NP sent at 08/23/2018  9:43 AM EDT ----- Regarding: Labs Just spoke to his wife - will have him hold torsemide and potassium until diarrhea resolves. Can you change lab appointment to Monday? Needs BMET and BNP.   Thanks!

## 2018-08-26 ENCOUNTER — Other Ambulatory Visit (HOSPITAL_COMMUNITY): Payer: Self-pay

## 2018-08-26 ENCOUNTER — Other Ambulatory Visit (HOSPITAL_COMMUNITY): Payer: Self-pay | Admitting: *Deleted

## 2018-08-26 ENCOUNTER — Other Ambulatory Visit: Payer: Self-pay

## 2018-08-26 ENCOUNTER — Telehealth (HOSPITAL_COMMUNITY): Payer: Self-pay | Admitting: Cardiology

## 2018-08-26 ENCOUNTER — Ambulatory Visit (HOSPITAL_COMMUNITY)
Admission: RE | Admit: 2018-08-26 | Discharge: 2018-08-26 | Disposition: A | Discharge status: Home / Self Care | Payer: 59 | Source: Ambulatory Visit | Attending: Internal Medicine | Admitting: Internal Medicine

## 2018-08-26 DIAGNOSIS — I519 Heart disease, unspecified: Secondary | ICD-10-CM

## 2018-08-26 DIAGNOSIS — I5032 Chronic diastolic (congestive) heart failure: Secondary | ICD-10-CM

## 2018-08-26 DIAGNOSIS — I5022 Chronic systolic (congestive) heart failure: Secondary | ICD-10-CM

## 2018-08-26 LAB — BASIC METABOLIC PANEL
Anion gap: 12 (ref 5–15)
BUN: 79 mg/dL — ABNORMAL HIGH (ref 6–20)
CO2: 15 mmol/L — ABNORMAL LOW (ref 22–32)
Calcium: 8 mg/dL — ABNORMAL LOW (ref 8.9–10.3)
Chloride: 115 mmol/L — ABNORMAL HIGH (ref 98–111)
Creatinine, Ser: 3.24 mg/dL — ABNORMAL HIGH (ref 0.61–1.24)
GFR calc Af Amer: 23 mL/min — ABNORMAL LOW (ref >60)
GFR calc non Af Amer: 20 mL/min — ABNORMAL LOW (ref >60)
Glucose, Bld: 91 mg/dL (ref 70–99)
Potassium: 3.7 mmol/L (ref 3.5–5.1)
Sodium: 142 mmol/L (ref 135–145)

## 2018-08-26 LAB — BRAIN NATRIURETIC PEPTIDE: B Natriuretic Peptide: 342.8 pg/mL — ABNORMAL HIGH (ref 0.0–100.0)

## 2018-08-26 NOTE — Telephone Encounter (Signed)
-----  Message from Georgiana Shore, NP sent at 08/26/2018 11:45 AM EDT ----- Creatinine still elevated. Discussed with Dr Haroldine Laws. He needs RHC this week. As long as symptoms are okay, he should stay off torsemide for now.

## 2018-08-26 NOTE — Progress Notes (Signed)
Sign and held orders for cath placed

## 2018-08-26 NOTE — Telephone Encounter (Signed)
Instructions verbally given to patient and patients wife  Voiced understanding   [name]  08/26/2018  You are scheduled for a Cardiac Catheterization on Wednesday, May 6 with Dr. Glori Bickers.  1. Please arrive at the [facility] (Main Entrance A) at [facility]: [address] [place], [place] [address] at 9:30 AM (This time is two hours before your procedure to ensure your preparation). Free valet parking service is available.   Special note: Every effort is made to have your procedure done on time. Please understand that emergencies sometimes delay scheduled procedures.  2. Diet: Do not eat solid foods after midnight.  The patient may have clear liquids until 5am upon the day of the procedure.  3. Labs: n/a  4. Medication instructions in preparation for your procedure:   Contrast Allergy: No     Continue to HOLD torsemide       On the morning of your procedure, take your Aspirin and any morning medicines NOT listed above.  You may use sips of water.  5. Plan for one night stay--bring personal belongings. 6. Bring a current list of your medications and current insurance cards. 7. You MUST have a responsible person to drive you home. 8. Someone MUST be with you the first 24 hours after you arrive home or your discharge will be delayed. 9. Please wear clothes that are easy to get on and off and wear slip-on shoes.  Thank you for allowing [place] to care for you!   -- [facility]

## 2018-08-28 ENCOUNTER — Encounter (HOSPITAL_COMMUNITY)
Admission: AD | Disposition: A | Discharge status: Home / Self Care | Payer: Self-pay | Source: Home / Self Care | Attending: Internal Medicine

## 2018-08-28 ENCOUNTER — Encounter (HOSPITAL_COMMUNITY): Payer: Self-pay | Admitting: *Deleted

## 2018-08-28 ENCOUNTER — Other Ambulatory Visit: Payer: Self-pay

## 2018-08-28 ENCOUNTER — Inpatient Hospital Stay (HOSPITAL_COMMUNITY)
Admission: AD | Admit: 2018-08-28 | Discharge: 2018-08-31 | DRG: 378 | Disposition: A | Discharge status: Home / Self Care | Payer: 59 | Attending: Internal Medicine | Admitting: Internal Medicine

## 2018-08-28 DIAGNOSIS — I5032 Chronic diastolic (congestive) heart failure: Secondary | ICD-10-CM | POA: Diagnosis not present

## 2018-08-28 DIAGNOSIS — N184 Chronic kidney disease, stage 4 (severe): Secondary | ICD-10-CM | POA: Diagnosis present

## 2018-08-28 DIAGNOSIS — K921 Melena: Secondary | ICD-10-CM

## 2018-08-28 DIAGNOSIS — K5520 Angiodysplasia of colon without hemorrhage: Secondary | ICD-10-CM

## 2018-08-28 DIAGNOSIS — I313 Pericardial effusion (noninflammatory): Secondary | ICD-10-CM | POA: Diagnosis present

## 2018-08-28 DIAGNOSIS — L80 Vitiligo: Secondary | ICD-10-CM | POA: Diagnosis present

## 2018-08-28 DIAGNOSIS — I10 Essential (primary) hypertension: Secondary | ICD-10-CM | POA: Diagnosis not present

## 2018-08-28 DIAGNOSIS — I2721 Secondary pulmonary arterial hypertension: Secondary | ICD-10-CM

## 2018-08-28 DIAGNOSIS — E039 Hypothyroidism, unspecified: Secondary | ICD-10-CM | POA: Diagnosis present

## 2018-08-28 DIAGNOSIS — Z7982 Long term (current) use of aspirin: Secondary | ICD-10-CM

## 2018-08-28 DIAGNOSIS — F17210 Nicotine dependence, cigarettes, uncomplicated: Secondary | ICD-10-CM | POA: Diagnosis present

## 2018-08-28 DIAGNOSIS — D649 Anemia, unspecified: Secondary | ICD-10-CM | POA: Diagnosis present

## 2018-08-28 DIAGNOSIS — K31811 Angiodysplasia of stomach and duodenum with bleeding: Secondary | ICD-10-CM | POA: Diagnosis not present

## 2018-08-28 DIAGNOSIS — I252 Old myocardial infarction: Secondary | ICD-10-CM

## 2018-08-28 DIAGNOSIS — K317 Polyp of stomach and duodenum: Secondary | ICD-10-CM | POA: Diagnosis present

## 2018-08-28 DIAGNOSIS — E782 Mixed hyperlipidemia: Secondary | ICD-10-CM

## 2018-08-28 DIAGNOSIS — I890 Lymphedema, not elsewhere classified: Secondary | ICD-10-CM | POA: Diagnosis present

## 2018-08-28 DIAGNOSIS — I3139 Other pericardial effusion (noninflammatory): Secondary | ICD-10-CM | POA: Diagnosis present

## 2018-08-28 DIAGNOSIS — K922 Gastrointestinal hemorrhage, unspecified: Secondary | ICD-10-CM | POA: Diagnosis present

## 2018-08-28 DIAGNOSIS — Z79899 Other long term (current) drug therapy: Secondary | ICD-10-CM

## 2018-08-28 DIAGNOSIS — D123 Benign neoplasm of transverse colon: Secondary | ICD-10-CM

## 2018-08-28 DIAGNOSIS — Z955 Presence of coronary angioplasty implant and graft: Secondary | ICD-10-CM

## 2018-08-28 DIAGNOSIS — I5022 Chronic systolic (congestive) heart failure: Secondary | ICD-10-CM

## 2018-08-28 DIAGNOSIS — I2510 Atherosclerotic heart disease of native coronary artery without angina pectoris: Secondary | ICD-10-CM | POA: Diagnosis present

## 2018-08-28 DIAGNOSIS — E785 Hyperlipidemia, unspecified: Secondary | ICD-10-CM | POA: Diagnosis present

## 2018-08-28 DIAGNOSIS — D631 Anemia in chronic kidney disease: Secondary | ICD-10-CM | POA: Diagnosis present

## 2018-08-28 DIAGNOSIS — M109 Gout, unspecified: Secondary | ICD-10-CM | POA: Diagnosis present

## 2018-08-28 DIAGNOSIS — N189 Chronic kidney disease, unspecified: Secondary | ICD-10-CM

## 2018-08-28 DIAGNOSIS — N179 Acute kidney failure, unspecified: Secondary | ICD-10-CM | POA: Diagnosis not present

## 2018-08-28 DIAGNOSIS — M1A09X0 Idiopathic chronic gout, multiple sites, without tophus (tophi): Secondary | ICD-10-CM

## 2018-08-28 DIAGNOSIS — F17200 Nicotine dependence, unspecified, uncomplicated: Secondary | ICD-10-CM | POA: Diagnosis present

## 2018-08-28 DIAGNOSIS — M349 Systemic sclerosis, unspecified: Secondary | ICD-10-CM | POA: Diagnosis present

## 2018-08-28 DIAGNOSIS — I2781 Cor pulmonale (chronic): Secondary | ICD-10-CM | POA: Diagnosis present

## 2018-08-28 DIAGNOSIS — Z7289 Other problems related to lifestyle: Secondary | ICD-10-CM

## 2018-08-28 DIAGNOSIS — I50810 Right heart failure, unspecified: Secondary | ICD-10-CM | POA: Diagnosis not present

## 2018-08-28 DIAGNOSIS — I130 Hypertensive heart and chronic kidney disease with heart failure and stage 1 through stage 4 chronic kidney disease, or unspecified chronic kidney disease: Secondary | ICD-10-CM | POA: Diagnosis present

## 2018-08-28 DIAGNOSIS — E872 Acidosis: Secondary | ICD-10-CM | POA: Diagnosis present

## 2018-08-28 DIAGNOSIS — Z716 Tobacco abuse counseling: Secondary | ICD-10-CM

## 2018-08-28 DIAGNOSIS — D62 Acute posthemorrhagic anemia: Secondary | ICD-10-CM | POA: Diagnosis present

## 2018-08-28 DIAGNOSIS — K2970 Gastritis, unspecified, without bleeding: Secondary | ICD-10-CM

## 2018-08-28 DIAGNOSIS — Z7951 Long term (current) use of inhaled steroids: Secondary | ICD-10-CM

## 2018-08-28 DIAGNOSIS — N183 Chronic kidney disease, stage 3 (moderate): Secondary | ICD-10-CM | POA: Diagnosis present

## 2018-08-28 DIAGNOSIS — I959 Hypotension, unspecified: Secondary | ICD-10-CM | POA: Diagnosis not present

## 2018-08-28 DIAGNOSIS — F419 Anxiety disorder, unspecified: Secondary | ICD-10-CM | POA: Diagnosis present

## 2018-08-28 LAB — CBC
HCT: 21.5 % — ABNORMAL LOW (ref 39.0–52.0)
HCT: 15.2 % — ABNORMAL LOW (ref 39.0–52.0)
HCT: 31.3 % — ABNORMAL LOW (ref 39.0–52.0)
Hemoglobin: 6.9 g/dL — CL (ref 13.0–17.0)
Hemoglobin: 4.6 g/dL — CL (ref 13.0–17.0)
Hemoglobin: 10.4 g/dL — ABNORMAL LOW (ref 13.0–17.0)
MCH: 29.6 pg (ref 26.0–34.0)
MCH: 29.7 pg (ref 26.0–34.0)
MCH: 28.7 pg (ref 26.0–34.0)
MCHC: 32.1 g/dL (ref 30.0–36.0)
MCHC: 30.3 g/dL (ref 30.0–36.0)
MCHC: 33.2 g/dL (ref 30.0–36.0)
MCV: 92.3 fL (ref 80.0–100.0)
MCV: 98.1 fL (ref 80.0–100.0)
MCV: 86.5 fL (ref 80.0–100.0)
Platelets: 183 10*3/uL (ref 150–400)
Platelets: 185 10*3/uL (ref 150–400)
Platelets: 169 10*3/uL (ref 150–400)
RBC: 2.33 MIL/uL — ABNORMAL LOW (ref 4.22–5.81)
RBC: 1.55 MIL/uL — ABNORMAL LOW (ref 4.22–5.81)
RBC: 3.62 MIL/uL — ABNORMAL LOW (ref 4.22–5.81)
RDW: 17.6 % — ABNORMAL HIGH (ref 11.5–15.5)
RDW: 18 % — ABNORMAL HIGH (ref 11.5–15.5)
RDW: 18.1 % — ABNORMAL HIGH (ref 11.5–15.5)
WBC: 4.2 10*3/uL (ref 4.0–10.5)
WBC: 4.5 10*3/uL (ref 4.0–10.5)
WBC: 4.5 10*3/uL (ref 4.0–10.5)
nRBC: 0 % (ref 0.0–0.2)
nRBC: 0 % (ref 0.0–0.2)
nRBC: 0 % (ref 0.0–0.2)

## 2018-08-28 LAB — URINALYSIS, ROUTINE W REFLEX MICROSCOPIC
Bilirubin Urine: NEGATIVE
Glucose, UA: NEGATIVE mg/dL
Hgb urine dipstick: NEGATIVE
Ketones, ur: NEGATIVE mg/dL
Leukocytes,Ua: NEGATIVE
Nitrite: NEGATIVE
Protein, ur: NEGATIVE mg/dL
Specific Gravity, Urine: 1.006 (ref 1.005–1.030)
pH: 5 (ref 5.0–8.0)

## 2018-08-28 LAB — FERRITIN: Ferritin: 90 ng/mL (ref 24–336)

## 2018-08-28 LAB — RETICULOCYTES
Immature Retic Fract: 5 % (ref 2.3–15.9)
RBC.: 3.62 MIL/uL — ABNORMAL LOW (ref 4.22–5.81)
Retic Count, Absolute: 32.6 10*3/uL (ref 19.0–186.0)
Retic Ct Pct: 0.9 % (ref 0.4–3.1)

## 2018-08-28 LAB — PREPARE RBC (CROSSMATCH)

## 2018-08-28 LAB — FOLATE: Folate: 13.6 ng/mL (ref >5.9)

## 2018-08-28 SURGERY — RIGHT HEART CATH
Anesthesia: LOCAL

## 2018-08-28 MED ORDER — ONDANSETRON HCL 4 MG/2ML IJ SOLN: 4.0000 mg | Freq: Four times a day (QID) | INTRAMUSCULAR | Status: DC | PRN

## 2018-08-28 MED ORDER — ALLOPURINOL 100 MG PO TABS
100.0000 mg | ORAL_TABLET | Freq: Every day | ORAL | Status: DC
  Administered 2018-08-29 – 2018-08-31 (×2): 100 mg via ORAL
  Filled 2018-08-28 (×3): qty 1

## 2018-08-28 MED ORDER — SODIUM CHLORIDE 0.9% IV SOLUTION: Freq: Once | INTRAVENOUS | Status: DC

## 2018-08-28 MED ORDER — NICOTINE 21 MG/24HR TD PT24
21.0000 mg | MEDICATED_PATCH | Freq: Every day | TRANSDERMAL | Status: DC
  Administered 2018-08-28 – 2018-08-31 (×4): 21 mg via TRANSDERMAL
  Filled 2018-08-28 (×4): qty 1

## 2018-08-28 MED ORDER — ONDANSETRON HCL 4 MG PO TABS: 4.0000 mg | ORAL_TABLET | Freq: Four times a day (QID) | ORAL | Status: DC | PRN

## 2018-08-28 MED ORDER — ASPIRIN 81 MG PO CHEW: 81.0000 mg | CHEWABLE_TABLET | ORAL | Status: DC

## 2018-08-28 MED ORDER — LORAZEPAM 0.5 MG PO TABS: 0.5000 mg | ORAL_TABLET | Freq: Every evening | ORAL | Status: DC | PRN

## 2018-08-28 MED ORDER — FUROSEMIDE 10 MG/ML IJ SOLN
20.0000 mg | Freq: Once | INTRAMUSCULAR | Status: AC
  Administered 2018-08-28: 20 mg via INTRAVENOUS
  Filled 2018-08-28: qty 2

## 2018-08-28 MED ORDER — SODIUM CHLORIDE 0.9 % IV SOLN: 250.0000 mL | INTRAVENOUS | Status: DC | PRN

## 2018-08-28 MED ORDER — SODIUM CHLORIDE 0.9% FLUSH
3.0000 mL | Freq: Two times a day (BID) | INTRAVENOUS | Status: DC
  Administered 2018-08-28 – 2018-08-31 (×6): 3 mL via INTRAVENOUS

## 2018-08-28 MED ORDER — HYDROCODONE-ACETAMINOPHEN 10-325 MG PO TABS: 0.5000 | ORAL_TABLET | Freq: Two times a day (BID) | ORAL | Status: DC | PRN

## 2018-08-28 MED ORDER — ALBUTEROL SULFATE (2.5 MG/3ML) 0.083% IN NEBU: 2.5000 mg | INHALATION_SOLUTION | Freq: Four times a day (QID) | RESPIRATORY_TRACT | Status: DC | PRN

## 2018-08-28 MED ORDER — SIMVASTATIN 20 MG PO TABS
40.0000 mg | ORAL_TABLET | Freq: Every day | ORAL | Status: DC
  Administered 2018-08-28 – 2018-08-30 (×3): 40 mg via ORAL
  Filled 2018-08-28 (×3): qty 2

## 2018-08-28 MED ORDER — SODIUM CHLORIDE 0.9% FLUSH: 3.0000 mL | INTRAVENOUS | Status: DC | PRN

## 2018-08-28 MED ORDER — FLUTICASONE PROPIONATE 50 MCG/ACT NA SUSP: 2.0000 | Freq: Every day | NASAL | Status: DC | PRN

## 2018-08-28 MED ORDER — SILDENAFIL CITRATE 20 MG PO TABS
20.0000 mg | ORAL_TABLET | Freq: Three times a day (TID) | ORAL | Status: DC
  Administered 2018-08-28 – 2018-08-31 (×8): 20 mg via ORAL
  Filled 2018-08-28 (×8): qty 1

## 2018-08-28 MED ORDER — SODIUM CHLORIDE 0.9% FLUSH: 3.0000 mL | Freq: Two times a day (BID) | INTRAVENOUS | Status: DC

## 2018-08-28 MED ORDER — SODIUM CHLORIDE 0.9 % WEIGHT BASED INFUSION
3.0000 mL/kg/h | INTRAVENOUS | Status: DC
  Administered 2018-08-28: 3 mL/kg/h via INTRAVENOUS

## 2018-08-28 MED ORDER — MACITENTAN 10 MG PO TABS
10.0000 mg | ORAL_TABLET | Freq: Every day | ORAL | Status: DC
  Administered 2018-08-29 – 2018-08-31 (×3): 10 mg via ORAL
  Filled 2018-08-28 (×3): qty 1

## 2018-08-28 MED ORDER — ACETAMINOPHEN 325 MG PO TABS: 650.0000 mg | ORAL_TABLET | Freq: Four times a day (QID) | ORAL | Status: DC | PRN

## 2018-08-28 MED ORDER — ACETAMINOPHEN 650 MG RE SUPP: 650.0000 mg | Freq: Four times a day (QID) | RECTAL | Status: DC | PRN

## 2018-08-28 MED ORDER — ALPRAZOLAM 0.5 MG PO TABS: 0.5000 mg | ORAL_TABLET | Freq: Every day | ORAL | Status: DC | PRN

## 2018-08-28 MED ORDER — SODIUM CHLORIDE 0.9 % WEIGHT BASED INFUSION: 1.0000 mL/kg/h | INTRAVENOUS | Status: DC

## 2018-08-28 MED ORDER — PANTOPRAZOLE SODIUM 40 MG IV SOLR
40.0000 mg | Freq: Two times a day (BID) | INTRAVENOUS | Status: DC
  Administered 2018-08-28 – 2018-08-30 (×4): 40 mg via INTRAVENOUS
  Filled 2018-08-28 (×5): qty 40

## 2018-08-28 MED ORDER — FERROUS SULFATE 325 (65 FE) MG PO TABS
325.0000 mg | ORAL_TABLET | Freq: Every day | ORAL | Status: DC
  Administered 2018-08-28 – 2018-08-29 (×2): 325 mg via ORAL
  Filled 2018-08-28 (×2): qty 1

## 2018-08-28 NOTE — Progress Notes (Signed)
Transferred pt to 2c with monitor.
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
## 2018-08-28 NOTE — Progress Notes (Signed)
Lab notified that HGB was 6.9.  Anderson Malta RN noitified. Will repeat CBC

## 2018-08-28 NOTE — Plan of Care (Signed)

## 2018-08-28 NOTE — Progress Notes (Signed)
Report called to 2c spoke with Arbie Cookey also spoke with pt wife Mrs Sigmon and informed her of pt to be admitted.

## 2018-08-28 NOTE — Progress Notes (Signed)
Attempted to call report, nurse is busy at the moment will call back in a few minutes.

## 2018-08-28 NOTE — Progress Notes (Signed)
Placed on tele to monitor
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
## 2018-08-29 ENCOUNTER — Other Ambulatory Visit (HOSPITAL_COMMUNITY): Payer: 59

## 2018-08-29 DIAGNOSIS — F419 Anxiety disorder, unspecified: Secondary | ICD-10-CM | POA: Diagnosis present

## 2018-08-29 DIAGNOSIS — M349 Systemic sclerosis, unspecified: Secondary | ICD-10-CM | POA: Diagnosis present

## 2018-08-29 DIAGNOSIS — K5521 Angiodysplasia of colon with hemorrhage: Secondary | ICD-10-CM | POA: Diagnosis not present

## 2018-08-29 DIAGNOSIS — K635 Polyp of colon: Secondary | ICD-10-CM | POA: Diagnosis not present

## 2018-08-29 DIAGNOSIS — D123 Benign neoplasm of transverse colon: Secondary | ICD-10-CM | POA: Diagnosis not present

## 2018-08-29 DIAGNOSIS — N179 Acute kidney failure, unspecified: Secondary | ICD-10-CM | POA: Diagnosis not present

## 2018-08-29 DIAGNOSIS — K921 Melena: Secondary | ICD-10-CM | POA: Diagnosis not present

## 2018-08-29 DIAGNOSIS — N189 Chronic kidney disease, unspecified: Secondary | ICD-10-CM | POA: Diagnosis present

## 2018-08-29 DIAGNOSIS — D631 Anemia in chronic kidney disease: Secondary | ICD-10-CM | POA: Diagnosis present

## 2018-08-29 DIAGNOSIS — I5032 Chronic diastolic (congestive) heart failure: Secondary | ICD-10-CM | POA: Diagnosis not present

## 2018-08-29 DIAGNOSIS — K3189 Other diseases of stomach and duodenum: Secondary | ICD-10-CM | POA: Diagnosis not present

## 2018-08-29 DIAGNOSIS — I959 Hypotension, unspecified: Secondary | ICD-10-CM | POA: Diagnosis not present

## 2018-08-29 DIAGNOSIS — E039 Hypothyroidism, unspecified: Secondary | ICD-10-CM | POA: Diagnosis present

## 2018-08-29 DIAGNOSIS — K2970 Gastritis, unspecified, without bleeding: Secondary | ICD-10-CM | POA: Diagnosis not present

## 2018-08-29 DIAGNOSIS — E872 Acidosis: Secondary | ICD-10-CM | POA: Diagnosis not present

## 2018-08-29 DIAGNOSIS — I2510 Atherosclerotic heart disease of native coronary artery without angina pectoris: Secondary | ICD-10-CM | POA: Diagnosis present

## 2018-08-29 DIAGNOSIS — I2721 Secondary pulmonary arterial hypertension: Secondary | ICD-10-CM | POA: Diagnosis present

## 2018-08-29 DIAGNOSIS — L80 Vitiligo: Secondary | ICD-10-CM | POA: Diagnosis present

## 2018-08-29 DIAGNOSIS — K922 Gastrointestinal hemorrhage, unspecified: Secondary | ICD-10-CM | POA: Diagnosis not present

## 2018-08-29 DIAGNOSIS — M109 Gout, unspecified: Secondary | ICD-10-CM | POA: Diagnosis present

## 2018-08-29 DIAGNOSIS — F17210 Nicotine dependence, cigarettes, uncomplicated: Secondary | ICD-10-CM | POA: Diagnosis present

## 2018-08-29 DIAGNOSIS — K31811 Angiodysplasia of stomach and duodenum with bleeding: Secondary | ICD-10-CM | POA: Diagnosis not present

## 2018-08-29 DIAGNOSIS — I252 Old myocardial infarction: Secondary | ICD-10-CM | POA: Diagnosis not present

## 2018-08-29 DIAGNOSIS — I890 Lymphedema, not elsewhere classified: Secondary | ICD-10-CM | POA: Diagnosis present

## 2018-08-29 DIAGNOSIS — I130 Hypertensive heart and chronic kidney disease with heart failure and stage 1 through stage 4 chronic kidney disease, or unspecified chronic kidney disease: Secondary | ICD-10-CM | POA: Diagnosis not present

## 2018-08-29 DIAGNOSIS — E785 Hyperlipidemia, unspecified: Secondary | ICD-10-CM | POA: Diagnosis present

## 2018-08-29 DIAGNOSIS — D62 Acute posthemorrhagic anemia: Secondary | ICD-10-CM | POA: Diagnosis present

## 2018-08-29 DIAGNOSIS — I313 Pericardial effusion (noninflammatory): Secondary | ICD-10-CM | POA: Diagnosis present

## 2018-08-29 DIAGNOSIS — D649 Anemia, unspecified: Secondary | ICD-10-CM | POA: Diagnosis present

## 2018-08-29 DIAGNOSIS — N183 Chronic kidney disease, stage 3 (moderate): Secondary | ICD-10-CM | POA: Diagnosis present

## 2018-08-29 DIAGNOSIS — I2781 Cor pulmonale (chronic): Secondary | ICD-10-CM | POA: Diagnosis present

## 2018-08-29 DIAGNOSIS — Z7982 Long term (current) use of aspirin: Secondary | ICD-10-CM | POA: Diagnosis not present

## 2018-08-29 DIAGNOSIS — K317 Polyp of stomach and duodenum: Secondary | ICD-10-CM | POA: Diagnosis not present

## 2018-08-29 LAB — CBC
HCT: 28 % — ABNORMAL LOW (ref 39.0–52.0)
Hemoglobin: 9.6 g/dL — ABNORMAL LOW (ref 13.0–17.0)
MCH: 29.1 pg (ref 26.0–34.0)
MCHC: 34.3 g/dL (ref 30.0–36.0)
MCV: 84.8 fL (ref 80.0–100.0)
Platelets: 160 10*3/uL (ref 150–400)
RBC: 3.3 MIL/uL — ABNORMAL LOW (ref 4.22–5.81)
RDW: 18.2 % — ABNORMAL HIGH (ref 11.5–15.5)
WBC: 4.2 10*3/uL (ref 4.0–10.5)
nRBC: 0 % (ref 0.0–0.2)

## 2018-08-29 LAB — BASIC METABOLIC PANEL
Anion gap: 9 (ref 5–15)
Anion gap: 10 (ref 5–15)
BUN: 75 mg/dL — ABNORMAL HIGH (ref 6–20)
BUN: 75 mg/dL — ABNORMAL HIGH (ref 6–20)
CO2: 16 mmol/L — ABNORMAL LOW (ref 22–32)
CO2: 15 mmol/L — ABNORMAL LOW (ref 22–32)
Calcium: 8.1 mg/dL — ABNORMAL LOW (ref 8.9–10.3)
Calcium: 7.9 mg/dL — ABNORMAL LOW (ref 8.9–10.3)
Chloride: 114 mmol/L — ABNORMAL HIGH (ref 98–111)
Chloride: 115 mmol/L — ABNORMAL HIGH (ref 98–111)
Creatinine, Ser: 2.73 mg/dL — ABNORMAL HIGH (ref 0.61–1.24)
Creatinine, Ser: 2.64 mg/dL — ABNORMAL HIGH (ref 0.61–1.24)
GFR calc Af Amer: 28 mL/min — ABNORMAL LOW (ref >60)
GFR calc Af Amer: 29 mL/min — ABNORMAL LOW (ref >60)
GFR calc non Af Amer: 24 mL/min — ABNORMAL LOW (ref >60)
GFR calc non Af Amer: 25 mL/min — ABNORMAL LOW (ref >60)
Glucose, Bld: 95 mg/dL (ref 70–99)
Glucose, Bld: 75 mg/dL (ref 70–99)
Potassium: 4.5 mmol/L (ref 3.5–5.1)
Potassium: 4.1 mmol/L (ref 3.5–5.1)
Sodium: 139 mmol/L (ref 135–145)
Sodium: 140 mmol/L (ref 135–145)

## 2018-08-29 LAB — BPAM RBC
Blood Product Expiration Date: 202005142359
Blood Product Expiration Date: 202005142359
ISSUE DATE / TIME: 202005061430
ISSUE DATE / TIME: 202005061742
Unit Type and Rh: 6200
Unit Type and Rh: 6200

## 2018-08-29 LAB — HIV ANTIBODY (ROUTINE TESTING W REFLEX): HIV Screen 4th Generation wRfx: NONREACTIVE

## 2018-08-29 LAB — HEPATIC FUNCTION PANEL
ALT: 11 U/L (ref 0–44)
AST: 13 U/L — ABNORMAL LOW (ref 15–41)
Albumin: 2.4 g/dL — ABNORMAL LOW (ref 3.5–5.0)
Alkaline Phosphatase: 71 U/L (ref 38–126)
Bilirubin, Direct: 0.2 mg/dL (ref 0.0–0.2)
Indirect Bilirubin: 0.6 mg/dL (ref 0.3–0.9)
Total Bilirubin: 0.8 mg/dL (ref 0.3–1.2)
Total Protein: 5.8 g/dL — ABNORMAL LOW (ref 6.5–8.1)

## 2018-08-29 LAB — TYPE AND SCREEN
ABO/RH(D): A POS
Antibody Screen: NEGATIVE
Unit division: 0
Unit division: 0

## 2018-08-29 LAB — IRON AND TIBC
Iron: 205 ug/dL — ABNORMAL HIGH (ref 45–182)
Saturation Ratios: 84 % — ABNORMAL HIGH (ref 17.9–39.5)
TIBC: 244 ug/dL — ABNORMAL LOW (ref 250–450)
UIBC: 39 ug/dL

## 2018-08-29 LAB — TSH: TSH: 4.087 u[IU]/mL (ref 0.350–4.500)

## 2018-08-29 LAB — VITAMIN B12: Vitamin B-12: >7500 pg/mL — ABNORMAL HIGH (ref 180–914)

## 2018-08-29 MED ORDER — PEG-KCL-NACL-NASULF-NA ASC-C 100 G PO SOLR
0.5000 | Freq: Once | ORAL | Status: AC
  Administered 2018-08-29: 100 g via ORAL
  Filled 2018-08-29: qty 1

## 2018-08-29 MED ORDER — PEG-KCL-NACL-NASULF-NA ASC-C 100 G PO SOLR: 1.0000 | Freq: Once | ORAL | Status: DC

## 2018-08-29 MED ORDER — PEG-KCL-NACL-NASULF-NA ASC-C 100 G PO SOLR
0.5000 | Freq: Once | ORAL | Status: AC
  Administered 2018-08-30: 100 g via ORAL
  Filled 2018-08-29: qty 1

## 2018-08-29 NOTE — Plan of Care (Signed)
  Problem: Education: Goal: Knowledge of General Education information will improve Description Including pain rating scale, medication(s)/side effects and non-pharmacologic comfort measures 08/29/2018 0846 by Don Perking, RN Outcome: Progressing 08/28/2018 1939 by Don Perking, RN Outcome: Progressing   Problem: Health Behavior/Discharge Planning: Goal: Ability to manage health-related needs will improve 08/29/2018 0846 by Don Perking, RN Outcome: Progressing 08/28/2018 1939 by Don Perking, RN Outcome: Progressing   Problem: Clinical Measurements: Goal: Ability to maintain clinical measurements within normal limits will improve 08/29/2018 0846 by Don Perking, RN Outcome: Progressing 08/28/2018 1939 by Don Perking, RN Outcome: Progressing Goal: Will remain free from infection 08/29/2018 0846 by Don Perking, RN Outcome: Progressing 08/28/2018 1939 by Don Perking, RN Outcome: Progressing Goal: Diagnostic test results will improve 08/29/2018 0846 by Don Perking, RN Outcome: Progressing 08/28/2018 1939 by Don Perking, RN Outcome: Progressing Goal: Respiratory complications will improve 08/29/2018 0846 by Don Perking, RN Outcome: Progressing 08/28/2018 1939 by Don Perking, RN Outcome: Progressing Goal: Cardiovascular complication will be avoided 08/29/2018 0846 by Don Perking, RN Outcome: Progressing 08/28/2018 1939 by Don Perking, RN Outcome: Progressing   Problem: Activity: Goal: Risk for activity intolerance will decrease 08/29/2018 0846 by Don Perking, RN Outcome: Progressing 08/28/2018 1939 by Don Perking, RN Outcome: Progressing   Problem: Nutrition: Goal: Adequate nutrition will be maintained 08/29/2018 0846 by Don Perking, RN Outcome: Progressing 08/28/2018 1939 by Don Perking, RN Outcome: Progressing   Problem: Coping: Goal: Level of anxiety will decrease 08/29/2018  0846 by Don Perking, RN Outcome: Progressing 08/28/2018 1939 by Don Perking, RN Outcome: Progressing   Problem: Elimination: Goal: Will not experience complications related to bowel motility 08/29/2018 0846 by Don Perking, RN Outcome: Progressing 08/28/2018 1939 by Don Perking, RN Outcome: Progressing Goal: Will not experience complications related to urinary retention 08/29/2018 0846 by Don Perking, RN Outcome: Progressing 08/28/2018 1939 by Don Perking, RN Outcome: Progressing   Problem: Pain Managment: Goal: General experience of comfort will improve 08/29/2018 0846 by Don Perking, RN Outcome: Progressing 08/28/2018 1939 by Don Perking, RN Outcome: Progressing   Problem: Safety: Goal: Ability to remain free from injury will improve 08/29/2018 0846 by Don Perking, RN Outcome: Progressing 08/28/2018 1939 by Don Perking, RN Outcome: Progressing   Problem: Skin Integrity: Goal: Risk for impaired skin integrity will decrease 08/29/2018 0846 by Don Perking, RN Outcome: Progressing 08/28/2018 1939 by Don Perking, RN Outcome: Progressing   Problem: Education: Goal: Ability to identify signs and symptoms of gastrointestinal bleeding will improve 08/29/2018 0846 by Don Perking, RN Outcome: Progressing 08/28/2018 1939 by Don Perking, RN Outcome: Progressing   Problem: Bowel/Gastric: Goal: Will show no signs and symptoms of gastrointestinal bleeding 08/29/2018 0846 by Don Perking, RN Outcome: Progressing 08/28/2018 1939 by Don Perking, RN Outcome: Progressing   Problem: Fluid Volume: Goal: Will show no signs and symptoms of excessive bleeding 08/29/2018 0846 by Don Perking, RN Outcome: Progressing 08/28/2018 1939 by Don Perking, RN Outcome: Progressing   Problem: Clinical Measurements: Goal: Complications related to the disease process, condition or treatment will be avoided  or minimized 08/29/2018 0846 by Don Perking, RN Outcome: Progressing 08/28/2018 1939 by Don Perking, RN Outcome: Progressing

## 2018-08-29 NOTE — Progress Notes (Signed)
Advanced Heart Failure Rounding Note   Subjective:    He feels much better after transfusion.  Denies CP, sob,orthopnea or PND. Says he is hungry.   No further melena.  Hgb 9.6. Creatinine improved 3.2 -> 2.6   Objective:   Weight Range:  Vital Signs:   Temp:  [97.6 F (36.4 C)-97.9 F (36.6 C)] 97.6 F (36.4 C) (05/07 1101) Pulse Rate:  [60-139] 72 (05/07 1101) Resp:  [13-20] 19 (05/07 1101) BP: (94-123)/(57-73) 105/61 (05/07 1101) SpO2:  [90 %-100 %] 92 % (05/06 1741) Last BM Date: 08/28/18  Weight change: Filed Weights   08/28/18 0900  Weight: 58 kg    Intake/Output:   Intake/Output Summary (Last 24 hours) at 08/29/2018 1435 Last data filed at 08/29/2018 0323 Gross per 24 hour  Intake 1614.25 ml  Output 1875 ml  Net -260.75 ml     Physical Exam: General:  Lying flat in bed  No resp difficulty HEENT: normal Neck: supple. JVP 8-9 . Carotids 2+ bilat; no bruits. No lymphadenopathy or thryomegaly appreciated. Cor: PMI nondisplaced. Regular rate & rhythm. 2/6 TR Lungs: clear Abdomen: soft, nontender, nondistended. No hepatosplenomegaly. No bruits or masses. Good bowel sounds. Extremities: no cyanosis, clubbing, rash, tr edema Neuro: alert & orientedx3, cranial nerves grossly intact. moves all 4 extremities w/o difficulty. Affect pleasant  Telemetry: Sinus 70s Personally reviewed   Labs: Basic Metabolic Panel: Recent Labs  Lab 08/26/18 1037 08/28/18 2157 08/29/18 0327  NA 142 139 140  K 3.7 4.5 4.1  CL 115* 114* 115*  CO2 15* 16* 15*  GLUCOSE 91 95 75  BUN 79* 75* 75*  CREATININE 3.24* 2.73* 2.64*  CALCIUM 8.0* 8.1* 7.9*    Liver Function Tests: Recent Labs  Lab 08/29/18 0327  AST 13*  ALT 11  ALKPHOS 71  BILITOT 0.8  PROT 5.8*  ALBUMIN 2.4*   No results for input(s): LIPASE, AMYLASE in the last 168 hours. No results for input(s): AMMONIA in the last 168 hours.  CBC: Recent Labs  Lab 08/28/18 1026 08/28/18 1248 08/28/18 2157  08/29/18 0327  WBC 4.2 4.5 4.5 4.2  HGB 6.9* 4.6* 10.4* 9.6*  HCT 21.5* 15.2* 31.3* 28.0*  MCV 92.3 98.1 86.5 84.8  PLT 183 185 169 160    Cardiac Enzymes: No results for input(s): CKTOTAL, CKMB, CKMBINDEX, TROPONINI in the last 168 hours.  BNP: BNP (last 3 results) Recent Labs    06/11/18 0852 08/13/18 1251 08/26/18 1037  BNP 1,301.6* 359.8* 342.8*    ProBNP (last 3 results) Recent Labs    01/15/18 1509  PROBNP 4,482*      Other results:  Imaging:  No results found.   Medications:     Scheduled Medications: . allopurinol  100 mg Oral Daily  . ferrous sulfate  325 mg Oral Daily  . macitentan  10 mg Oral Daily  . nicotine  21 mg Transdermal Daily  . pantoprazole (PROTONIX) IV  40 mg Intravenous Q12H  . peg 3350 powder  0.5 kit Oral Once   And  . [START ON 08/30/2018] peg 3350 powder  0.5 kit Oral Once  . sildenafil  20 mg Oral TID  . simvastatin  40 mg Oral q1800  . sodium chloride flush  3 mL Intravenous Q12H     Infusions:   PRN Medications:  acetaminophen **OR** acetaminophen, albuterol, fluticasone, HYDROcodone-acetaminophen, LORazepam, ondansetron **OR** ondansetron (ZOFRAN) IV, sodium chloride flush   Assessment:   [name] a 58 y.o.male with h/o  scleroderma (diagnosed early 2000s), PAH, ETOH/tobacco abuse, HTN, CAD s/p MI (s/p dRCA stent 2010) and previous pericardial effusion s/p pericardial window in 2010 by Dr. [name] admitted with symptomatic anemia likely due to UGIB   Plan/Discussion:    1. Symptomatic anemia due to UGIB - Hgb 6.5 on initial check today. Repeat 4.9 with normal MCV - Several week h/o melena - ? PUD vs variceal (h/o ETOH) - currently hemodynamically stable - Hgb improved to 9.6 with transfusion  - GI has seen planning EGD and colon tomorrow - He has moderate to severe PAH so at higher risk for complications with colonoscopy but I think he can proceed   2. 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 PA 81/26 (48), PCW 17, PVC 6.0 -Need todiscuss with Rheumatology regarding need for treatment of underlying CTD - Hard to follow O2 sats with peripheral changes but will need to ensure adequate oxygenation - Continue sildenafil 20 mg TID - Continue opsumit 10 mg daily  3. 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  - Volume status stable today. Diuretics on hold with AKI. Resume soon.  - Continue spiro 25 mg dailyfor now. - Discussed limiting fluid and salt intake.  4. Acute on CKD III - Baseline creatinine ~2.0 - Creatinine up to 3.24 but back to 2.6 with trasfusion.. Follow closely. Hold diuretics for now  5. CAD - s/p previous RCA stent. - LHC 07/01/18: Non obstructive CAD with patent RCA stent. - Hold ASA for now until results of scope back  -No s/s ischemia  6. Scleroderma - needs f/u with Rheum. Has not seen recently.Unable to see until after COVID crisis  7. Tobacco use -smoking 3/4 ppd. Encouraged cessationagain today.   Length of Stay: 0   Glori Bickers MD 08/29/2018, 2:35 PM  Advanced Heart Failure Team Pager [phone] (M-F; 7a - 4p)  Please contact [facility] for night-coverage after hours (4p -7a ) and weekends on [email or web address]

## 2018-08-29 NOTE — H&P (View-Only) (Signed)
     Progress Note    ASSESSMENT AND PLAN:   1. Severe Akins anemia. Admitted for heart cath for evaluation of progressive SOB. Pre-op labs - hgb of 4.6 - 6.9), down from baseline of around 10.  Stools loose and dark (on iron) at home.  No abdominal pain, N/V or other localizing GI symptoms. Rule out PUD, AMVs, gastrointestinal neoplasm -s/p 2uPRBC and hgb up to 9.6.    -Doesn't appear to be iron deficient. Ferritin 90, low TIBC. His iron saturation is quite elevated at 84 -He feels much better after blood transfusion. I think he can tolerate both EGD and colonoscopy tomorrow. The risks and benefits of EGD abd colonoscopy with possible polypectomy / biopsies were discussed and the patient agrees to proceed.    2. Tobacco use  3. Alcohol use, query misuse. Liver tests unremarkable except for low albumin.    SUBJECTIVE    no complaints. Feels better after blood transfusion   OBJECTIVE:     Vital signs in last 24 hours: Temp:  [97.5 F (36.4 C)-97.9 F (36.6 C)] 97.6 F (36.4 C) (05/07 1101) Pulse Rate:  [60-139] 72 (05/07 1101) Resp:  [13-20] 19 (05/07 1101) BP: (86-123)/(57-73) 105/61 (05/07 1101) SpO2:  [90 %-100 %] 92 % (05/06 1741) Last BM Date: 08/28/18 General:   Alert, thin male in NAD Heart:  Regular rate and rhythm Pulm: Normal respiratory effort Abdomen:  Soft, nondistended, nontender.  Normal bowel sounds Psych:  cooperative.  Normal mood and affect.   Intake/Output from previous day: 05/06 0701 - 05/07 0700 In: 1614.3 [P.O.:480; I.V.:503; Blood:631.3] Out: 1875 [Urine:1875] Intake/Output this shift: No intake/output data recorded.  Lab Results: Recent Labs    08/28/18 1248 08/28/18 2157 08/29/18 0327  WBC 4.5 4.5 4.2  HGB 4.6* 10.4* 9.6*  HCT 15.2* 31.3* 28.0*  PLT 185 169 160   BMET Recent Labs    08/28/18 2157 08/29/18 0327  NA 139 140  K 4.5 4.1  CL 114* 115*  CO2 16* 15*  GLUCOSE 95 75  BUN 75* 75*  CREATININE 2.73* 2.64*  CALCIUM  8.1* 7.9*   LFT Recent Labs    08/29/18 0327  PROT 5.8*  ALBUMIN 2.4*  AST 13*  ALT 11  ALKPHOS 87  BILITOT 0.8  BILIDIR 0.2  IBILI 0.6    Principal Problem:   Symptomatic anemia Active Problems:   Scleroderma (HCC)   Smoker   Gout   Hyperlipidemia   Pericardial effusion   Essential hypertension   Acute renal failure superimposed on chronic kidney disease (HCC)   GI bleed   Chronic systolic (congestive) heart failure ([place])   Melena     LOS: 0 days   Tye Savoy ,NP 08/29/2018, 1:56 PM

## 2018-08-29 NOTE — Progress Notes (Signed)
     Progress Note    ASSESSMENT AND PLAN:   1. Severe Sawyer anemia. Admitted for heart cath for evaluation of progressive SOB. Pre-op labs - hgb of 4.6 - 6.9), down from baseline of around 10.  Stools loose and dark (on iron) at home.  No abdominal pain, N/V or other localizing GI symptoms. Rule out PUD, AMVs, gastrointestinal neoplasm -s/p 2uPRBC and hgb up to 9.6.    -Doesn't appear to be iron deficient. Ferritin 90, low TIBC. His iron saturation is quite elevated at 84 -He feels much better after blood transfusion. I think he can tolerate both EGD and colonoscopy tomorrow. The risks and benefits of EGD abd colonoscopy with possible polypectomy / biopsies were discussed and the patient agrees to proceed.    2. Tobacco use  3. Alcohol use, query misuse. Liver tests unremarkable except for low albumin.    SUBJECTIVE    no complaints. Feels better after blood transfusion   OBJECTIVE:     Vital signs in last 24 hours: Temp:  [97.5 F (36.4 C)-97.9 F (36.6 C)] 97.6 F (36.4 C) (05/07 1101) Pulse Rate:  [60-139] 72 (05/07 1101) Resp:  [13-20] 19 (05/07 1101) BP: (86-123)/(57-73) 105/61 (05/07 1101) SpO2:  [90 %-100 %] 92 % (05/06 1741) Last BM Date: 08/28/18 General:   Alert, thin male in NAD Heart:  Regular rate and rhythm Pulm: Normal respiratory effort Abdomen:  Soft, nondistended, nontender.  Normal bowel sounds Psych:  cooperative.  Normal mood and affect.   Intake/Output from previous day: 05/06 0701 - 05/07 0700 In: 1614.3 [P.O.:480; I.V.:503; Blood:631.3] Out: 1875 [Urine:1875] Intake/Output this shift: No intake/output data recorded.  Lab Results: Recent Labs    08/28/18 1248 08/28/18 2157 08/29/18 0327  WBC 4.5 4.5 4.2  HGB 4.6* 10.4* 9.6*  HCT 15.2* 31.3* 28.0*  PLT 185 169 160   BMET Recent Labs    08/28/18 2157 08/29/18 0327  NA 139 140  K 4.5 4.1  CL 114* 115*  CO2 16* 15*  GLUCOSE 95 75  BUN 75* 75*  CREATININE 2.73* 2.64*  CALCIUM  8.1* 7.9*   LFT Recent Labs    08/29/18 0327  PROT 5.8*  ALBUMIN 2.4*  AST 13*  ALT 11  ALKPHOS 71  BILITOT 0.8  BILIDIR 0.2  IBILI 0.6    Principal Problem:   Symptomatic anemia Active Problems:   Scleroderma (HCC)   Smoker   Gout   Hyperlipidemia   Pericardial effusion   Essential hypertension   Acute renal failure superimposed on chronic kidney disease (HCC)   GI bleed   Chronic systolic (congestive) heart failure (HCC)   Melena     LOS: 0 days   Dierra Riesgo ,NP 08/29/2018, 1:56 PM       

## 2018-08-29 NOTE — Progress Notes (Signed)
PROGRESS NOTE    [name]  TZG:[id number] DOB: [birth year] DOA: 08/28/2018 PCP: [name], MD    Brief Narrative: 58 year old with past medical history significant for hypertension, hyperlipidemia, coronary artery disease, CHF, pulmonary artery hypertension, prior pericardial effusion status post pericardial window 2013, scleroderma, hypothyroidism prior remote history of alcohol use, gout who presents complaining of worsening shortness of breath.  Patient last echocardiogram performed on 12/20 showed pericardial effusion, Dr. Koleen Nimrod with [facility] was recommending medical management for pericardial effusion.  Patient presented for cath to evaluate pulmonary pressure and he was found to have a hemoglobin at 6.  Sequently hemoglobin was 4.7.  Patient with some history of present melena.   Assessment & Plan:   Principal Problem:   Symptomatic anemia Active Problems:   Scleroderma (HCC)   Smoker   Gout   Hyperlipidemia   Pericardial effusion   Essential hypertension   Acute renal failure superimposed on chronic kidney disease (HCC)   GI bleed   Chronic systolic (congestive) heart failure (HCC)   Melena  1-Severe Anemia; suspect GI bleed.  Patient presented with shortness of breath, hemoglobin at 4.6. Received 2 units of packed red blood cell. Continue with IV Protonix. GI has been consulted and is plan is for endoscopy colonoscopy on 5/8.   2-AKI on CKD stage 3;  She presented with acute on chronic renal failure likely related to hypoperfusion from anemia. Creatinine on admission at 3.2, creatinine has decreased to 2.6 close to baseline.  3-Hypotension; Likely related to anemia.  Improve after blood transfusion.  Coronary artery disease, previous history of right coronary artery stent placement. Holding aspirin due to concern for GI bleed.  4-Chronic Diastolic Heart failure, cor pulmonale ; Pulmonary Hypertension, recurrent pericardial effusion Patient presented  for planned left cath by Dr. Haroldine Laws but on hold due to low hemoglobin.  Cardiology  recommended inpatient evaluation for for anemia, okay to proceed with endoscopy colonoscopy. -Continue with sildenafil, opsumit.  HLD;  Continue with the statins  Scleroderma. ;  To follow-up with rheumatology for further care of the scleroderma.  Gout; Continue with allopurinol.  Metabolic acidosis;  Related to renal failure Might need sodium bicarb Repeat  labs in the morning  History of gout:    Estimated body mass index is 19.16 kg/m as calculated from the following:   Height as of this encounter: 5' 8.5" (1.74 m).   Weight as of this encounter: 58 kg.   DVT prophylaxis: scd Code Status: full code Family Communication: wife over the phone Disposition Plan: Patient need to remain in the hospital for further evaluation of anemia, he will require endoscopy and colonoscopy to determine if he is going to be able to be back on aspirin for his history of coronary artery disease, he will also need a heart cath for his pulmonary hypertension requiring large pericardial effusion   Consultants:   Cardiology  GI   Procedures:  None   Antimicrobials:  none   Subjective: He reported improvement of shortness of breath at rest.  He has not been out of the bed to ambulate to determine how he is doing on ambulation. He does report dark black stool  Objective: Vitals:   08/28/18 2031 08/28/18 2300 08/29/18 0300 08/29/18 0323  BP: 114/67  108/66   Pulse: 60     Resp: _0 Temp: 97.9 F (36.6 C)   97.6 F (36.4 C)  TempSrc: Oral     SpO2:  Weight:      Height:        Intake/Output Summary (Last 24 hours) at 08/29/2018 0709 Last data filed at 08/29/2018 0323 Gross per 24 hour  Intake 1614.25 ml  Output 1875 ml  Net -260.75 ml   Filed Weights   08/28/18 0900  Weight: 58 kg    Examination:  General exam: Appears calm and comfortable  Respiratory system: Crackles at  the bases Cardiovascular system: S1 & S2 heard, RRR. No JVD, murmurs, rubs, gallops or clicks. No pedal edema. Gastrointestinal system: Abdomen is nondistended, soft and nontender. No organomegaly or masses felt. Normal bowel sounds heard. Central nervous system: Alert and oriented. No focal neurological deficits. Extremities: Symmetric 5 x 5 power. Skin: No rashes, lesions or ulcers   Data Reviewed: I have personally reviewed following labs and imaging studies  CBC: Recent Labs  Lab 08/28/18 1026 08/28/18 1248 08/28/18 2157 08/29/18 0327  WBC 4.2 4.5 4.5 4.2  HGB 6.9* 4.6* 10.4* 9.6*  HCT 21.5* 15.2* 31.3* 28.0*  MCV 92.3 98.1 86.5 84.8  PLT 183 185 169 159   Basic Metabolic Panel: Recent Labs  Lab 08/26/18 1037 08/28/18 2157 08/29/18 0327  NA 142 139 140  K 3.7 4.5 4.1  CL 115* 114* 115*  CO2 15* 16* 15*  GLUCOSE 91 95 75  BUN 79* 75* 75*  CREATININE 3.24* 2.73* 2.64*  CALCIUM 8.0* 8.1* 7.9*   GFR: Estimated Creatinine Clearance: 24.7 mL/min (A) (by C-G formula based on SCr of 2.64 mg/dL (H)). Liver Function Tests: Recent Labs  Lab 08/29/18 0327  AST 13*  ALT 11  ALKPHOS 71  BILITOT 0.8  PROT 5.8*  ALBUMIN 2.4*   No results for input(s): LIPASE, AMYLASE in the last 168 hours. No results for input(s): AMMONIA in the last 168 hours. Coagulation Profile: No results for input(s): INR, PROTIME in the last 168 hours. Cardiac Enzymes: No results for input(s): CKTOTAL, CKMB, CKMBINDEX, TROPONINI in the last 168 hours. BNP (last 3 results) Recent Labs    01/15/18 1509  PROBNP 4,482*   HbA1C: No results for input(s): HGBA1C in the last 72 hours. CBG: No results for input(s): GLUCAP in the last 168 hours. Lipid Profile: No results for input(s): CHOL, HDL, LDLCALC, TRIG, CHOLHDL, LDLDIRECT in the last 72 hours. Thyroid Function Tests: Recent Labs    08/29/18 0327  TSH 4.087   Anemia Panel: Recent Labs    08/28/18 2157  VITAMINB12 >7,500*  FOLATE  13.6  FERRITIN 90  TIBC 244*  IRON 205*  RETICCTPCT 0.9   Sepsis Labs: No results for input(s): PROCALCITON, LATICACIDVEN in the last 168 hours.  No results found for this or any previous visit (from the past 240 hour(s)).       Radiology Studies: No results found.      Scheduled Meds: . allopurinol  100 mg Oral Daily  . ferrous sulfate  325 mg Oral Daily  . macitentan  10 mg Oral Daily  . nicotine  21 mg Transdermal Daily  . pantoprazole (PROTONIX) IV  40 mg Intravenous Q12H  . sildenafil  20 mg Oral TID  . simvastatin  40 mg Oral q1800  . sodium chloride flush  3 mL Intravenous Q12H   Continuous Infusions:   LOS: 0 days    Time spent: 35 minutes.     Elmarie Shiley, MD [facility] Pager [phone]  If 7PM-7AM, please contact night-coverage [email or web address] Password [email or web address] 08/29/2018, 7:09 AM

## 2018-08-30 ENCOUNTER — Encounter (HOSPITAL_COMMUNITY): Payer: Self-pay

## 2018-08-30 ENCOUNTER — Inpatient Hospital Stay (HOSPITAL_COMMUNITY): Payer: 59 | Admitting: Anesthesiology

## 2018-08-30 ENCOUNTER — Encounter (HOSPITAL_COMMUNITY)
Admission: AD | Disposition: A | Discharge status: Home / Self Care | Payer: Self-pay | Source: Home / Self Care | Attending: Internal Medicine

## 2018-08-30 DIAGNOSIS — K2970 Gastritis, unspecified, without bleeding: Secondary | ICD-10-CM

## 2018-08-30 DIAGNOSIS — K5521 Angiodysplasia of colon with hemorrhage: Secondary | ICD-10-CM

## 2018-08-30 DIAGNOSIS — D123 Benign neoplasm of transverse colon: Secondary | ICD-10-CM

## 2018-08-30 HISTORY — PX: HOT HEMOSTASIS: SHX5433

## 2018-08-30 HISTORY — PX: COLONOSCOPY: SHX5424

## 2018-08-30 HISTORY — PX: POLYPECTOMY: SHX5525

## 2018-08-30 HISTORY — PX: BIOPSY: SHX5522

## 2018-08-30 HISTORY — PX: ESOPHAGOGASTRODUODENOSCOPY: SHX5428

## 2018-08-30 LAB — BASIC METABOLIC PANEL
Anion gap: 10 (ref 5–15)
BUN: 69 mg/dL — ABNORMAL HIGH (ref 6–20)
CO2: 16 mmol/L — ABNORMAL LOW (ref 22–32)
Calcium: 8.2 mg/dL — ABNORMAL LOW (ref 8.9–10.3)
Chloride: 116 mmol/L — ABNORMAL HIGH (ref 98–111)
Creatinine, Ser: 2.4 mg/dL — ABNORMAL HIGH (ref 0.61–1.24)
GFR calc Af Amer: 33 mL/min — ABNORMAL LOW (ref >60)
GFR calc non Af Amer: 28 mL/min — ABNORMAL LOW (ref >60)
Glucose, Bld: 76 mg/dL (ref 70–99)
Potassium: 3.9 mmol/L (ref 3.5–5.1)
Sodium: 142 mmol/L (ref 135–145)

## 2018-08-30 LAB — CBC
HCT: 28.6 % — ABNORMAL LOW (ref 39.0–52.0)
Hemoglobin: 9.5 g/dL — ABNORMAL LOW (ref 13.0–17.0)
MCH: 28.9 pg (ref 26.0–34.0)
MCHC: 33.2 g/dL (ref 30.0–36.0)
MCV: 86.9 fL (ref 80.0–100.0)
Platelets: 178 10*3/uL (ref 150–400)
RBC: 3.29 MIL/uL — ABNORMAL LOW (ref 4.22–5.81)
RDW: 17.9 % — ABNORMAL HIGH (ref 11.5–15.5)
WBC: 4.1 10*3/uL (ref 4.0–10.5)
nRBC: 0 % (ref 0.0–0.2)

## 2018-08-30 SURGERY — COLONOSCOPY
Anesthesia: General

## 2018-08-30 MED ORDER — SUCCINYLCHOLINE CHLORIDE 200 MG/10ML IV SOSY
PREFILLED_SYRINGE | INTRAVENOUS | Status: DC | PRN
  Administered 2018-08-30: 100 mg via INTRAVENOUS

## 2018-08-30 MED ORDER — LIDOCAINE 2% (20 MG/ML) 5 ML SYRINGE
INTRAMUSCULAR | Status: DC | PRN
  Administered 2018-08-30: 60 mg via INTRAVENOUS

## 2018-08-30 MED ORDER — PANTOPRAZOLE SODIUM 40 MG PO TBEC
40.0000 mg | DELAYED_RELEASE_TABLET | Freq: Two times a day (BID) | ORAL | Status: DC
  Administered 2018-08-30 – 2018-08-31 (×3): 40 mg via ORAL
  Filled 2018-08-30 (×3): qty 1

## 2018-08-30 MED ORDER — SODIUM CHLORIDE 0.9 % IV SOLN
INTRAVENOUS | Status: DC
  Administered 2018-08-30: 11:00:00 via INTRAVENOUS

## 2018-08-30 MED ORDER — LACTATED RINGERS IV SOLN
INTRAVENOUS | Status: DC | PRN
  Administered 2018-08-30: 12:00:00 via INTRAVENOUS

## 2018-08-30 MED ORDER — PROPOFOL 10 MG/ML IV BOLUS
INTRAVENOUS | Status: DC | PRN
  Administered 2018-08-30: 20 mg via INTRAVENOUS
  Administered 2018-08-30: 60 mg via INTRAVENOUS

## 2018-08-30 MED ORDER — ONDANSETRON HCL 4 MG/2ML IJ SOLN
INTRAMUSCULAR | Status: DC | PRN
  Administered 2018-08-30: 4 mg via INTRAVENOUS

## 2018-08-30 MED ORDER — FENTANYL CITRATE (PF) 250 MCG/5ML IJ SOLN
INTRAMUSCULAR | Status: DC | PRN
  Administered 2018-08-30: 100 ug via INTRAVENOUS

## 2018-08-30 MED ORDER — DEXAMETHASONE SODIUM PHOSPHATE 10 MG/ML IJ SOLN
INTRAMUSCULAR | Status: DC | PRN
  Administered 2018-08-30: 4 mg via INTRAVENOUS

## 2018-08-30 MED ORDER — PHENYLEPHRINE 40 MCG/ML (10ML) SYRINGE FOR IV PUSH (FOR BLOOD PRESSURE SUPPORT)
PREFILLED_SYRINGE | INTRAVENOUS | Status: DC | PRN
  Administered 2018-08-30: 80 ug via INTRAVENOUS
  Administered 2018-08-30: 120 ug via INTRAVENOUS
  Administered 2018-08-30 (×2): 80 ug via INTRAVENOUS
  Administered 2018-08-30: 40 ug via INTRAVENOUS

## 2018-08-30 MED ORDER — EPHEDRINE SULFATE-NACL 50-0.9 MG/10ML-% IV SOSY
PREFILLED_SYRINGE | INTRAVENOUS | Status: DC | PRN
  Administered 2018-08-30 (×2): 10 mg via INTRAVENOUS

## 2018-08-30 MED ORDER — ETOMIDATE 2 MG/ML IV SOLN
INTRAVENOUS | Status: DC | PRN
  Administered 2018-08-30: 16 mg via INTRAVENOUS

## 2018-08-30 MED ORDER — SODIUM CHLORIDE 0.9 % IV SOLN
INTRAVENOUS | Status: DC | PRN
  Administered 2018-08-30: 12:00:00 25 ug/min via INTRAVENOUS

## 2018-08-30 NOTE — Anesthesia Postprocedure Evaluation (Signed)
Anesthesia Post Note  Patient: KASYN ROLPH  Procedure(s) Performed: COLONOSCOPY (N/A ) ESOPHAGOGASTRODUODENOSCOPY (EGD) (N/A ) HOT HEMOSTASIS (ARGON PLASMA COAGULATION/BICAP) (N/A ) BIOPSY POLYPECTOMY     Patient location during evaluation: PACU Anesthesia Type: General Level of consciousness: awake and alert Pain management: pain level controlled Vital Signs Assessment: post-procedure vital signs reviewed and stable Respiratory status: spontaneous breathing, nonlabored ventilation, respiratory function stable and patient connected to nasal cannula oxygen Cardiovascular status: blood pressure returned to baseline and stable Postop Assessment: no apparent nausea or vomiting Anesthetic complications: no    Last Vitals:  Vitals:   08/30/18 1030 08/30/18 1305  BP: 111/61 (!) 110/54  Pulse: 67 71  Resp: 13 15  Temp: 36.8 C 36.4 C  SpO2: 98% 98%    Last Pain:  Vitals:   08/30/18 1305  TempSrc: Axillary  PainSc:                  Malakhi Markwood

## 2018-08-30 NOTE — Transfer of Care (Signed)
Immediate Anesthesia Transfer of Care Note  Patient: [name]  Procedure(s) Performed: COLONOSCOPY (N/A ) ESOPHAGOGASTRODUODENOSCOPY (EGD) (N/A ) HOT HEMOSTASIS (ARGON PLASMA COAGULATION/BICAP) (N/A ) BIOPSY POLYPECTOMY  Patient Location: Endoscopy Unit  Anesthesia Type:General  Level of Consciousness: drowsy and patient cooperative  Airway & Oxygen Therapy: Patient Spontanous Breathing and Patient connected to face mask oxygen  Post-op Assessment: Report given to RN and Post -op Vital signs reviewed and stable  Post vital signs: Reviewed and stable  Last Vitals:  Vitals Value Taken Time  BP 110/54 08/30/2018  1:05 PM  Temp 36.4 C 08/30/2018  1:05 PM  Pulse 71 08/30/2018  1:05 PM  Resp 15 08/30/2018  1:07 PM  SpO2 98 % 08/30/2018  1:05 PM  Vitals shown include unvalidated device data.  Last Pain:  Vitals:   08/30/18 1305  TempSrc: Axillary  PainSc:       Patients Stated Pain Goal: 0 (81/85/90 9311)  Complications: No apparent anesthesia complications

## 2018-08-30 NOTE — Interval H&P Note (Signed)
History and Physical Interval Note:  08/30/2018 12:01 PM  [name]  has presented today for surgery, with the diagnosis of anemia.  The various methods of treatment have been discussed with the patient and family. After consideration of risks, benefits and other options for treatment, the patient has consented to  Procedure(s): COLONOSCOPY (N/A) ESOPHAGOGASTRODUODENOSCOPY (EGD) (N/A) as a surgical intervention.  [name] history has been reviewed, patient examined, no change in status, stable for surgery.  I have reviewed the patient's chart and labs.  Questions were answered to the patient's satisfaction.     Dominic Pea Riyaan Heroux

## 2018-08-30 NOTE — Progress Notes (Addendum)
Pt ambulated in a hallway without distress, no any complain of pain, liquid diet is tolerating well by the patient, vitals stable, will continue to monitor the patient  Palma Holter, RN

## 2018-08-30 NOTE — Op Note (Signed)
[facility] Patient Name: [name] Procedure Date : 08/30/2018 MRN: [id number] Attending MD: Gerrit Heck , MD Date of Birth: 04-02-1961 CSN: [id number] Age: 58 Admit Type: Inpatient Procedure:                Colonoscopy Indications:              Melena, Acute post hemorrhagic anemia Providers:                Gerrit Heck, MD, Glori Bickers, RN, Elspeth Cho Tech., Technician, Ladona Ridgel,                            Technician, Luane School Referring MD:              Medicines:                General Anesthesia Complications:            No immediate complications. Estimated Blood Loss:     Estimated blood loss was minimal. Procedure:                Pre-Anesthesia Assessment:                           - Prior to the procedure, a History and Physical                            was performed, and patient medications and                            allergies were reviewed. [name] tolerance of                            previous anesthesia was also reviewed. The risks                            and benefits of the procedure and the sedation                            options and risks were discussed with the patient.                            All questions were answered, and informed consent                            was obtained. Prior Anticoagulants: The patient has                            taken no previous anticoagulant or antiplatelet                            agents. ASA Grade Assessment: IV - A patient with  severe systemic disease that is a constant threat                            to life. After reviewing the risks and benefits,                            the patient was deemed in satisfactory condition to                            undergo the procedure.                           After obtaining informed consent, the colonoscope                            was passed under direct  vision. Throughout the                            procedure, the patient's blood pressure, pulse, and                            oxygen saturations were monitored continuously. The                            CF-HQ190L ([id number]) Olympus colonoscope was                            introduced through the anus and advanced to the the                            terminal ileum. The colonoscopy was performed                            without difficulty. The patient tolerated the                            procedure well. The quality of the bowel                            preparation was fair. The terminal ileum, ileocecal                            valve, appendiceal orifice, and rectum were                            photographed. Scope In: 12:31:46 PM Scope Out: 12:51:02 PM Scope Withdrawal Time: 0 hours 15 minutes 31 seconds  Total Procedure Duration: 0 hours 19 minutes 16 seconds  Findings:      The perianal and digital rectal examinations were normal.      A 12 mm polyp was found in the transverse colon. The polyp was       pedunculated. The polyp was removed with a hot snare. Resection and       retrieval were complete. Estimated blood loss: none.  Multiple angioectasias with bleeding were found in the sigmoid colon, in       the descending colon, in the ascending colon and in the cecum. This was       most extensive in the cecum, with active bleeding. Coagulation for       hemostasis using argon plasma at 1 liter/minute and 20 watts was       successful. Estimated blood loss was minimal.      A moderate amount of semi-liquid solid stool was found in the rectum, in       the recto-sigmoid colon, in the sigmoid colon, in the descending colon       and in the transverse colon, interfering with visualization. Lavage of       the area was performed using copious amounts of sterile water, resulting       in clearance with fair visualization.      The terminal ileum appeared normal.       The retroflexed view of the distal rectum and anal verge was normal and       showed no anal or rectal abnormalities on limited views. Impression:               - Preparation of the colon was fair.                           - One 12 mm polyp in the transverse colon, removed                            with a hot snare. Resected and retrieved.                           - Multiple bleeding colonic angioectasias. Treated                            with argon plasma coagulation (APC).                           - Stool in the rectum, in the recto-sigmoid colon,                            in the sigmoid colon, in the descending colon and                            in the transverse colon.                           - The examined portion of the ileum was normal.                           - The distal rectum and anal verge are normal on                            retroflexion view. Recommendation:           - Return patient to hospital ward for ongoing care.                           -  Full liquid diet today.                           - Continue present medications.                           - Await pathology results.                           - Repeat colonoscopy in 6 months because the bowel                            preparation was poor and for surveillance. Procedure Code(s):        --- Professional ---                           [phone], 62, Colonoscopy, flexible; with control of                            bleeding, any method                           45385, Colonoscopy, flexible; with removal of                            tumor(s), polyp(s), or other lesion(s) by snare                            technique Diagnosis Code(s):        --- Professional ---                           K63.5, Polyp of colon                           K55.21, Angiodysplasia of colon with hemorrhage                           K92.1, Melena (includes Hematochezia)                           D62, Acute posthemorrhagic  anemia CPT copyright 2019 [employer]. All rights reserved. The codes documented in this report are preliminary and upon coder review may  be revised to meet current compliance requirements. Gerrit Heck, MD 08/30/2018 1:22:49 PM Number of Addenda: 0

## 2018-08-30 NOTE — Anesthesia Procedure Notes (Signed)

## 2018-08-30 NOTE — Progress Notes (Addendum)
PROGRESS NOTE    [name]  WIO:[id number] DOB: 1961-04-16 DOA: 08/28/2018 PCP: [name], MD    Brief Narrative: 58 year old with past medical history significant for hypertension, hyperlipidemia, coronary artery disease, CHF, pulmonary artery hypertension, prior pericardial effusion status post pericardial window 2013, scleroderma, hypothyroidism prior remote history of alcohol use, gout who presents complaining of worsening shortness of breath.  Patient last echocardiogram performed on 12/20 showed pericardial effusion, Dr. Koleen Nimrod with [facility] was recommending medical management for pericardial effusion.  Patient presented for cath to evaluate pulmonary pressure and he was found to have a hemoglobin at 6.  Sequently hemoglobin was 4.7.  Patient with some history of present melena.   Assessment & Plan:   Principal Problem:   Symptomatic anemia Active Problems:   Scleroderma (HCC)   Smoker   Gout   Hyperlipidemia   Pericardial effusion   Essential hypertension   Acute renal failure superimposed on chronic kidney disease (HCC)   GI bleed   Chronic systolic (congestive) heart failure (HCC)   Melena   Anemia  1-Severe Anemia; related to GI bleed.  Patient presented with shortness of breath, hemoglobin at 4.6. Received 2 units of packed red blood cell. Continue with IV Protonix. GI has been consulted and is plan is for endoscopy colonoscopy on 5/8. Endoscopy today showed gastritis, congestive gastropathy, duodenal polyp biopsied, duodenal mucosa lymphangiectasia. Needs repeat endoscopy for 8 weeks. PPI BID for 8 weeks.  Colonoscopy; 12 mm polyp removed from transverse colon, multiples bleeding colonic angioectasis  treated with argon plasma coagulation.  -plan for today is to observe patient on full liquid diet.   2-AKI on CKD stage 3;  She presented with acute on chronic renal failure likely related to hypoperfusion from anemia. Creatinine on admission at 3.2,  creatinine has decreased to 2.6 close to baseline. Stable.  Holding diuretics.   3-Hypotension; Likely related to anemia.  Improve after blood transfusion.  Coronary artery disease, previous history of right coronary artery stent placement. Holding aspirin due to concern for GI bleed.  4-Chronic Diastolic Heart failure, cor pulmonale ; Pulmonary Hypertension, recurrent pericardial effusion Patient presented for planned left cath by Dr. Haroldine Laws but on hold due to low hemoglobin.  Cardiology  recommended inpatient evaluation for for anemia, okay to proceed with endoscopy colonoscopy. -Continue with sildenafil, opsumit.  HLD;  Continue with the statins  Scleroderma. ;  To follow-up with rheumatology for further care of the scleroderma.  Gout; Continue with allopurinol.  Metabolic acidosis;  Related to renal failure Might need sodium bicarb, defer to cardiology   History of gout:    Estimated body mass index is 19.44 kg/m as calculated from the following:   Height as of this encounter: 5' 8" (1.727 m).   Weight as of this encounter: 58 kg.   DVT prophylaxis: scd Code Status: full code Family Communication: wife over the phone Disposition Plan: Patient underwent endoscopy and colonoscopy today he underwent biopsy and polyp removal.  He was found to have colonic and caliectasis that was treated with argon asthma coagulation.  GI is recommending to observe patient overnight on clear diet.  Repeat hemoglobin in the morning   Consultants:   Cardiology  GI   Procedures:  None   Antimicrobials:  none   Subjective: Patient seen this morning prior to endoscopy, he denies abdominal pain, he does report one black stool  Objective: Vitals:   08/30/18 1305 08/30/18 1315 08/30/18 1325 08/30/18 1337  BP: (!) 110/54 (!) 87/45 92/68 [name])  96/51  Pulse: 71     Resp: _0 Temp: 97.6 F (36.4 C)     TempSrc: Axillary     SpO2: 98% 98% 100% 97%  Weight:       Height:        Intake/Output Summary (Last 24 hours) at 08/30/2018 1434 Last data filed at 08/30/2018 1244 Gross per 24 hour  Intake 2223 ml  Output 350 ml  Net 1873 ml   Filed Weights   08/28/18 0900 08/30/18 1030  Weight: 58 kg 58 kg    Examination:  General exam: No acute distress Respiratory system: Crackles at the bases Cardiovascular system: S1, S2 regular rhythm and rate Gastrointestinal system: Sounds present, soft nontender nondistended Central nervous system: Alert and oriented nonfocal Extremities: Metric power Skin: No rashes   Data Reviewed: I have personally reviewed following labs and imaging studies  CBC: Recent Labs  Lab 08/28/18 1026 08/28/18 1248 08/28/18 2157 08/29/18 0327 08/30/18 0148  WBC 4.2 4.5 4.5 4.2 4.1  HGB 6.9* 4.6* 10.4* 9.6* 9.5*  HCT 21.5* 15.2* 31.3* 28.0* 28.6*  MCV 92.3 98.1 86.5 84.8 86.9  PLT 183 185 169 160 957   Basic Metabolic Panel: Recent Labs  Lab 08/26/18 1037 08/28/18 2157 08/29/18 0327 08/30/18 0148  NA 142 139 140 142  K 3.7 4.5 4.1 3.9  CL 115* 114* 115* 116*  CO2 15* 16* 15* 16*  GLUCOSE 91 95 75 76  BUN 79* 75* 75* 69*  CREATININE 3.24* 2.73* 2.64* 2.40*  CALCIUM 8.0* 8.1* 7.9* 8.2*   GFR: Estimated Creatinine Clearance: 27.2 mL/min (A) (by C-G formula based on SCr of 2.4 mg/dL (H)). Liver Function Tests: Recent Labs  Lab 08/29/18 0327  AST 13*  ALT 11  ALKPHOS 71  BILITOT 0.8  PROT 5.8*  ALBUMIN 2.4*   No results for input(s): LIPASE, AMYLASE in the last 168 hours. No results for input(s): AMMONIA in the last 168 hours. Coagulation Profile: No results for input(s): INR, PROTIME in the last 168 hours. Cardiac Enzymes: No results for input(s): CKTOTAL, CKMB, CKMBINDEX, TROPONINI in the last 168 hours. BNP (last 3 results) Recent Labs    01/15/18 1509  PROBNP 4,482*   HbA1C: No results for input(s): HGBA1C in the last 72 hours. CBG: No results for input(s): GLUCAP in the last 168 hours.  Lipid Profile: No results for input(s): CHOL, HDL, LDLCALC, TRIG, CHOLHDL, LDLDIRECT in the last 72 hours. Thyroid Function Tests: Recent Labs    08/29/18 0327  TSH 4.087   Anemia Panel: Recent Labs    08/28/18 2157  VITAMINB12 >7,500*  FOLATE 13.6  FERRITIN 90  TIBC 244*  IRON 205*  RETICCTPCT 0.9   Sepsis Labs: No results for input(s): PROCALCITON, LATICACIDVEN in the last 168 hours.  No results found for this or any previous visit (from the past 240 hour(s)).       Radiology Studies: No results found.      Scheduled Meds: . allopurinol  100 mg Oral Daily  . macitentan  10 mg Oral Daily  . nicotine  21 mg Transdermal Daily  . pantoprazole  40 mg Oral BID  . sildenafil  20 mg Oral TID  . simvastatin  40 mg Oral q1800  . sodium chloride flush  3 mL Intravenous Q12H   Continuous Infusions:   LOS: 1 day    Time spent: 35 minutes.     Elmarie Shiley, MD [facility] Pager [phone]  If 7PM-7AM,  please contact night-coverage [email or web address] Password [facility] 08/30/2018, 2:34 PM

## 2018-08-30 NOTE — Anesthesia Preprocedure Evaluation (Addendum)
Anesthesia Evaluation  Patient identified by MRN, date of birth, ID band Patient awake    Reviewed: Allergy & Precautions, H&P , NPO status , Patient's Chart, lab work & pertinent test results, reviewed documented beta blocker date and time   Airway Mallampati: II  TM Distance: >3 FB Neck ROM: full    Dental no notable dental hx.    Pulmonary asthma , Current Smoker,    Pulmonary exam normal breath sounds clear to auscultation       Cardiovascular Exercise Tolerance: Good hypertension, + CAD, + Past MI and +CHF   Rhythm:regular Rate:Normal  Cath 3/20  Prox RCA to Mid RCA lesion is 40% stenosed. Mid RCA lesion is 40% stenosed. Previously placed Mid RCA to Dist RCA stent (unknown type) is widely patent. Acute Mrg lesion is 95% stenosed. Prox LAD lesion is 30% stenosed. Mid LAD to Dist LAD lesion is 40% stenosed. Ao = 140/77 (100) LV = 145/14 RA = 16 RV = 79/18 PA = 81/26 (48) PCW = 17 Fick cardiac output/index = 4.6/2.7 PVR = 6.0 WU Ao sat = 97% PA sat = 61%, 64% No RV-LV interaction on simultaneous pressure waveforms with deep breathing  Assessment: 1. Non-obstructive CAD with patent RCA stent 2. Moderate to severe PAH 3. No evidence of tamponade    Neuro/Psych Anxiety negative neurological ROS     GI/Hepatic negative GI ROS, Neg liver ROS,   Endo/Other  Hypothyroidism   Renal/GU Renal disease  negative genitourinary   Musculoskeletal   Abdominal   Peds  Hematology  (+) Blood dyscrasia, anemia ,   Anesthesia Other Findings   Reproductive/Obstetrics negative OB ROS                            Anesthesia Physical Anesthesia Plan  ASA: IV and emergent  Anesthesia Plan: General   Post-op Pain Management:    Induction:   PONV Risk Score and Plan: 2 and Ondansetron and Treatment may vary due to age or medical condition  Airway Management Planned: Oral ETT and  LMA  Additional Equipment:   Intra-op Plan:   Post-operative Plan:   Informed Consent: I have reviewed the patients History and Physical, chart, labs and discussed the procedure including the risks, benefits and alternatives for the proposed anesthesia with the patient or authorized representative who has indicated his/her understanding and acceptance.     Dental Advisory Given  Plan Discussed with: CRNA, Anesthesiologist and Surgeon  Anesthesia Plan Comments:         Anesthesia Quick Evaluation

## 2018-08-30 NOTE — Op Note (Signed)
[facility] Patient Name: [name] Procedure Date : 08/30/2018 MRN: [id number] Attending MD: Gerrit Heck , MD Date of Birth: 1960/10/01 CSN: [id number] Age: 58 Admit Type: Inpatient Procedure:                Upper GI endoscopy Indications:              Acute post hemorrhagic anemia, Melena Providers:                Gerrit Heck, MD, Glori Bickers, RN, William Dalton, Technician Referring MD:              Medicines:                General Anesthesia Complications:            No immediate complications. Estimated Blood Loss:     Estimated blood loss was minimal. Procedure:                Pre-Anesthesia Assessment:                           - Prior to the procedure, a History and Physical                            was performed, and patient medications and                            allergies were reviewed. [name] tolerance of                            previous anesthesia was also reviewed. The risks                            and benefits of the procedure and the sedation                            options and risks were discussed with the patient.                            All questions were answered, and informed consent                            was obtained. Prior Anticoagulants: The patient has                            taken no previous anticoagulant or antiplatelet                            agents. ASA Grade Assessment: IV - A patient with                            severe systemic disease that is a constant threat  to life. After reviewing the risks and benefits,                            the patient was deemed in satisfactory condition to                            undergo the procedure.                           After obtaining informed consent, the endoscope was                            passed under direct vision. Throughout the                            procedure, the patient's  blood pressure, pulse, and                            oxygen saturations were monitored continuously. The                            GIF-H190 ([id number]) Olympus gastroscope was                            introduced through the mouth, and advanced to the                            third part of duodenum. The upper GI endoscopy was                            accomplished without difficulty. The patient                            tolerated the procedure well. Scope In: Scope Out: Findings:      The examined esophagus was normal.      Moderate inflammation characterized by congestion (edema), erosions,       erythema and granularity was found in the gastric fundus and in the       gastric body. There was a small amount of hematin in the area, without       active bleeding. Several mucosal biopsies were taken with a cold forceps       for histology. Estimated blood loss was minimal.      Localized mildly congested mucosa was found at the pylorus. Biopsies       were taken with a cold forceps for histology. Estimated blood loss was       minimal.      Four small angioectasias were found in the second portion of the       duodenum and in the third portion of the duodenum. One was actively       bleeding in the 3rd portion. Coagulation for hemostasis using argon       plasma at 1 liter/minute and 20 watts was successful.      A single 8 mm sessile polypoid lesion with no bleeding was found in the       third portion of  the duodenum. Biopsies were taken with a cold forceps       for histology. The lesion collapsed and noted white exudate from the       lesion upon cold forceps biopsy, suspicious for cyst. Estimated blood       loss was minimal.      A single medium nodular area was found in the second portion of the       duodenum. Biopsies were taken with a cold forceps for histology.       Estimated blood loss was minimal.      Lymphangiectasia was present in the second portion of the duodenum  and       in the third portion of the duodenum. Impression:               - Normal esophagus.                           - Gastritis. Biopsied.                           - Congestive gastropathy. Biopsied. The endoscopic                            appearance is somewhat suspicious for malignant                            etiology and if mucosal biopsies unrevealing, would                            be prudent to repeat to ensure mucosal healing and                            repeat biopsies as indicated.                           - Four bleeding angioectasias in the duodenum.                            Treated with argon plasma coagulation (APC).                           - A single duodenal polyp. Biopsied.                           - Nodular, granular mucosa found in the duodenum.                            Biopsied.                           - Duodenal mucosal lymphangiectasia. Recommendation:           - Return patient to hospital ward for ongoing care.                           - Full liquid diet today and advance as tolerated.                           -  Continue present medications.                           - Await pathology results.                           - Repeat upper endoscopy in 8 weeks to check                            healing.                           - Use Protonix (pantoprazole) 40 mg PO BID for 8                            weeks. Repeat gastric biopsies at that time pending                            results of todays study and endoscopic appearance                            at the time of repeat.                           - Colonoscopy today. Procedure Code(s):        --- Professional ---                           [id number], Esophagogastroduodenoscopy, flexible,                            transoral; with biopsy, single or multiple Diagnosis Code(s):        --- Professional ---                           K29.70, Gastritis, unspecified, without bleeding                            K31.89, Other diseases of stomach and duodenum                           K31.811, Angiodysplasia of stomach and duodenum                            with bleeding                           K31.7, Polyp of stomach and duodenum                           I89.0, Lymphedema, not elsewhere classified                           D62, Acute posthemorrhagic anemia                           K92.1, Melena (includes Hematochezia) CPT copyright 2019  American Medical Association. All rights reserved. The codes documented in this report are preliminary and upon coder review may  be revised to meet current compliance requirements. Gerrit Heck, MD 08/30/2018 1:16:23 PM Number of Addenda: 0

## 2018-08-30 NOTE — Progress Notes (Signed)
Advanced Heart Failure Rounding Note   Subjective:    He feels much better after transfusion.  Says he wants to go home.    Denies CP or SOB. Still with some dark stools.   For EGD/colon   Objective:   Weight Range:  Vital Signs:   Temp:  [97.3 F (36.3 C)-98.2 F (36.8 C)] 97.6 F (36.4 C) (05/08 1305) Pulse Rate:  [64-71] 71 (05/08 1305) Resp:  [13-21] 18 (05/08 1337) BP: (87-131)/(45-89) 96/51 (05/08 1337) SpO2:  [97 %-100 %] 97 % (05/08 1337) Weight:  [58 kg] 58 kg (05/08 1030) Last BM Date: 08/30/18  Weight change: Filed Weights   08/28/18 0900 08/30/18 1030  Weight: 58 kg 58 kg    Intake/Output:   Intake/Output Summary (Last 24 hours) at 08/30/2018 1410 Last data filed at 08/30/2018 1244 Gross per 24 hour  Intake 2223 ml  Output 350 ml  Net 1873 ml     Physical Exam: General:  Lying in bed No resp difficulty HEENT: normal Neck: supple. JVP 7-8 Carotids 2+ bilat; no bruits. No lymphadenopathy or thryomegaly appreciated. Cor: PMI nondisplaced. Regular rate & rhythm. 2/6 TR Lungs: clear Abdomen: soft, nontender, nondistended. No hepatosplenomegaly. No bruits or masses. Good bowel sounds. Extremities: no cyanosis, clubbing, rash, edema Neuro: alert & orientedx3, cranial nerves grossly intact. moves all 4 extremities w/o difficulty. Affect pleasant   Telemetry: Sinus 70-80s Personally reviewed   Labs: Basic Metabolic Panel: Recent Labs  Lab 08/26/18 1037 08/28/18 2157 08/29/18 0327 08/30/18 0148  NA 142 139 140 142  K 3.7 4.5 4.1 3.9  CL 115* 114* 115* 116*  CO2 15* 16* 15* 16*  GLUCOSE 91 95 75 76  BUN 79* 75* 75* 69*  CREATININE 3.24* 2.73* 2.64* 2.40*  CALCIUM 8.0* 8.1* 7.9* 8.2*    Liver Function Tests: Recent Labs  Lab 08/29/18 0327  AST 13*  ALT 11  ALKPHOS 71  BILITOT 0.8  PROT 5.8*  ALBUMIN 2.4*   No results for input(s): LIPASE, AMYLASE in the last 168 hours. No results for input(s): AMMONIA in the last 168 hours.   CBC: Recent Labs  Lab 08/28/18 1026 08/28/18 1248 08/28/18 2157 08/29/18 0327 08/30/18 0148  WBC 4.2 4.5 4.5 4.2 4.1  HGB 6.9* 4.6* 10.4* 9.6* 9.5*  HCT 21.5* 15.2* 31.3* 28.0* 28.6*  MCV 92.3 98.1 86.5 84.8 86.9  PLT 183 185 169 160 178    Cardiac Enzymes: No results for input(s): CKTOTAL, CKMB, CKMBINDEX, TROPONINI in the last 168 hours.  BNP: BNP (last 3 results) Recent Labs    06/11/18 0852 08/13/18 1251 08/26/18 1037  BNP 1,301.6* 359.8* 342.8*    ProBNP (last 3 results) Recent Labs    01/15/18 1509  PROBNP 4,482*      Other results:  Imaging: No results found.   Medications:     Scheduled Medications: . allopurinol  100 mg Oral Daily  . macitentan  10 mg Oral Daily  . nicotine  21 mg Transdermal Daily  . pantoprazole  40 mg Oral BID  . sildenafil  20 mg Oral TID  . simvastatin  40 mg Oral q1800  . sodium chloride flush  3 mL Intravenous Q12H    Infusions:   PRN Medications: acetaminophen **OR** acetaminophen, albuterol, fluticasone, HYDROcodone-acetaminophen, LORazepam, ondansetron **OR** ondansetron (ZOFRAN) IV, sodium chloride flush   Assessment:   Cody Arrazola Striblinis a 58 y.o.male with h/o scleroderma (diagnosed early 2000s), PAH, ETOH/tobacco abuse, HTN, CAD s/p MI (s/p dRCA stent  2010) and previous pericardial effusion s/p pericardial window in 2010 by Dr. [name] admitted with symptomatic anemia likely due to UGIB   Plan/Discussion:    1. Symptomatic anemia due to UGIB - Hgb 6.5 on initial check today. Repeat 4.9 with normal MCV - Several week h/o melena - ? PUD vs variceal (h/o ETOH) - currently hemodynamically stable - Hgb improved to 9.5 with transfusion  - GI has seen planning EGD and colon later today - He has moderate to severe PAH so at higher risk for complications with colonoscopy but I think he can proceed   2. Pulmonary HTN with recurrent large pericardial effusion - due to his scleroderma and related PAH  (WHO Group I) - R/LHC 07/01/18: mod to severe PAH, no evidence of tamponade. PA 81/26 (48), PCW 17, PVC 6.0 -Need todiscuss with Rheumatology regarding need for treatment of underlying CTD - Hard to follow O2 sats with peripheral changes but will need to ensure adequate oxygenation - Continue sildenafil 20 mg TID - Continue opsumit 10 mg daily  3. 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  - Volume status stable today. Diuretics on hold with AKI. Resume soon.  - Continue spiro 25 mg dailyfor now. - Discussed limiting fluid and salt intake.  4. Acute on CKD III - Baseline creatinine ~2.0 - Creatinine up to 3.24 but back to 2.6 with trasfusion..Now 2.4 today. Continue to hold diuretics. Weight stable   5. CAD - s/p previous RCA stent. - LHC 07/01/18: Non obstructive CAD with patent RCA stent. - Hold ASA for now until results of scope back  -No s/s ischemia  6. Scleroderma - needs f/u with Rheum. Has not seen recently.Unable to see until after COVID crisis  7. Tobacco use -smoking 3/4 ppd. Encouraged cessationagain today.   Length of Stay: 1   Glori Bickers MD 08/30/2018, 2:10 PM  Advanced Heart Failure Team Pager [phone] (M-F; 7a - 4p)  Please contact [facility] for night-coverage after hours (4p -7a ) and weekends on [email or web address]

## 2018-08-31 DIAGNOSIS — K2970 Gastritis, unspecified, without bleeding: Secondary | ICD-10-CM

## 2018-08-31 DIAGNOSIS — D123 Benign neoplasm of transverse colon: Secondary | ICD-10-CM

## 2018-08-31 DIAGNOSIS — K2990 Gastroduodenitis, unspecified, without bleeding: Secondary | ICD-10-CM

## 2018-08-31 DIAGNOSIS — K5520 Angiodysplasia of colon without hemorrhage: Secondary | ICD-10-CM

## 2018-08-31 LAB — CBC
HCT: 28 % — ABNORMAL LOW (ref 39.0–52.0)
Hemoglobin: 9.3 g/dL — ABNORMAL LOW (ref 13.0–17.0)
MCH: 29 pg (ref 26.0–34.0)
MCHC: 33.2 g/dL (ref 30.0–36.0)
MCV: 87.2 fL (ref 80.0–100.0)
Platelets: 156 10*3/uL (ref 150–400)
RBC: 3.21 MIL/uL — ABNORMAL LOW (ref 4.22–5.81)
RDW: 17.4 % — ABNORMAL HIGH (ref 11.5–15.5)
WBC: 3.7 10*3/uL — ABNORMAL LOW (ref 4.0–10.5)
nRBC: 0 % (ref 0.0–0.2)

## 2018-08-31 LAB — BASIC METABOLIC PANEL
Anion gap: 11 (ref 5–15)
BUN: 56 mg/dL — ABNORMAL HIGH (ref 6–20)
CO2: 15 mmol/L — ABNORMAL LOW (ref 22–32)
Calcium: 8.2 mg/dL — ABNORMAL LOW (ref 8.9–10.3)
Chloride: 113 mmol/L — ABNORMAL HIGH (ref 98–111)
Creatinine, Ser: 2.21 mg/dL — ABNORMAL HIGH (ref 0.61–1.24)
GFR calc Af Amer: 36 mL/min — ABNORMAL LOW (ref >60)
GFR calc non Af Amer: 31 mL/min — ABNORMAL LOW (ref >60)
Glucose, Bld: 121 mg/dL — ABNORMAL HIGH (ref 70–99)
Potassium: 4.1 mmol/L (ref 3.5–5.1)
Sodium: 139 mmol/L (ref 135–145)

## 2018-08-31 MED ORDER — PANTOPRAZOLE SODIUM 40 MG PO TBEC: 40.0000 mg | DELAYED_RELEASE_TABLET | Freq: Two times a day (BID) | ORAL | 6 refills | Status: DC

## 2018-08-31 MED ORDER — TORSEMIDE 20 MG PO TABS: 20.0000 mg | ORAL_TABLET | Freq: Every day | ORAL | 0 refills | Status: DC

## 2018-08-31 MED ORDER — TORSEMIDE 20 MG PO TABS
20.0000 mg | ORAL_TABLET | Freq: Every day | ORAL | Status: DC
  Administered 2018-08-31: 20 mg via ORAL
  Filled 2018-08-31: qty 1

## 2018-08-31 NOTE — Discharge Summary (Signed)
Physician Discharge Summary  [name] SEG:[id number] DOB: 1960-11-05 DOA: 08/28/2018  PCP: [name], MD  Admit date: 08/28/2018 Discharge date: 08/31/2018  Admitted From: Home  Disposition: Home   Recommendations for Outpatient Follow-up:  1. Follow up with PCP in 1-2 weeks 2. Please obtain BMP/CBC in one week 3. Needs follow up with nephrology for renal failure.  4. Follow up with cardiology for further care of Heart failure and pulmonary HTN 5. Follow up with GI for repeat endoscopy in 8 weeks.     Discharge Condition; stable.  CODE STATUS: full code Diet recommendation: Heart Healthy   Brief/Interim Summary: 58 year old with past medical history significant for hypertension, hyperlipidemia, coronary artery disease, CHF, pulmonary artery hypertension, prior pericardial effusion status post pericardial window 2013, scleroderma, hypothyroidism prior remote history of alcohol use, gout who presents complaining of worsening shortness of breath.  Patient last echocardiogram performed on 12/20 showed pericardial effusion, Dr. Koleen Nimrod with [facility] was recommending medical management for pericardial effusion.  Patient presented for cath to evaluate pulmonary pressure and he was found to have a hemoglobin at 6.  Sequently hemoglobin was 4.7.  Patient with some history of present melena.   1-Severe Anemia; related to GI bleed.  Patient presented with shortness of breath, hemoglobin at 4.6. Received 2 units of packed red blood cell. Continue with IV Protonix. GI has been consulted and is plan is for endoscopy colonoscopy on 5/8. Endoscopy today showed gastritis, congestive gastropathy, duodenal polyp biopsied, duodenal mucosa lymphangiectasia. Needs repeat endoscopy for 8 weeks. PPI BID for 8 weeks. Care discussed with wife Colonoscopy; 12 mm polyp removed from transverse colon, multiples bleeding colonic angioectasis  treated with argon plasma coagulation.  -hb stable. He has  tolerated diet.    2-AKI on CKD stage 3;  She presented with acute on chronic renal failure likely related to hypoperfusion from anemia. Creatinine on admission at 3.2, creatinine has decreased to 2.6 close to baseline. Stable.  Ok to resume diuretic per cardiology.   3-Hypotension; Likely related to anemia.  Improve after blood transfusion.  Coronary artery disease, previous history of right coronary artery stent placement. Holding aspirin due to concern for GI bleed.  4-Chronic Diastolic Heart failure, cor pulmonale ; Pulmonary Hypertension, recurrent pericardial effusion Patient presented for planned left cath by Dr. Haroldine Laws but on hold due to low hemoglobin.  Cardiology  recommended inpatient evaluation for for anemia, okay to proceed with endoscopy colonoscopy. -Continue with sildenafil, opsumit. -he has not been getting spironolactone.  -resume torsemide today.   HLD;  Continue with the statins  Scleroderma. ;  To follow-up with rheumatology for further care of the scleroderma.  Gout; Continue with allopurinol.  Metabolic acidosis;  Related to renal failure Might need sodium bicarb, defer to cardiology Stable. Asymptomatic.   History of gout:    Discharge Diagnoses:  Principal Problem:   Symptomatic anemia Active Problems:   Scleroderma (HCC)   Smoker   Gout   Hyperlipidemia   Pericardial effusion   Essential hypertension   Acute renal failure superimposed on chronic kidney disease (HCC)   GI bleed   Chronic systolic (congestive) heart failure (HCC)   Melena   Anemia   Arteriovenous malformation of gastrointestinal tract   Gastritis and gastroduodenitis   Adenomatous polyp of transverse colon    Discharge Instructions  Discharge Instructions    Diet - low sodium heart healthy   Complete by:  As directed    Diet - low sodium heart healthy   Complete  by:  As directed    Increase activity slowly   Complete by:  As directed    Increase  activity slowly   Complete by:  As directed      Allergies as of 08/31/2018   No Known Allergies     Medication List    STOP taking these medications   amLODipine 10 MG tablet Commonly known as:  NORVASC   aspirin 81 MG chewable tablet   Bystolic 10 MG tablet Generic drug:  nebivolol   ferrous sulfate 325 (65 FE) MG EC tablet   isosorbide mononitrate 60 MG 24 hr tablet Commonly known as:  IMDUR   Potassium Chloride ER 20 MEQ Tbcr     TAKE these medications   allopurinol 100 MG tablet Commonly known as:  ZYLOPRIM TAKE 1 TABLET BY MOUTH EVERY DAY   ALPRAZolam 0.5 MG tablet Commonly known as:  XANAX Take 0.5 mg by mouth daily as needed for anxiety.   B-12 2500 MCG Tabs Take 2,500 mcg by mouth daily.   colchicine 0.6 MG tablet Take 1 tablet (0.6 mg total) by mouth daily as needed. What changed:  reasons to take this   fluticasone 50 MCG/ACT nasal spray Commonly known as:  FLONASE Place 2 sprays into both nostrils daily as needed for allergies.   HYDROcodone-acetaminophen 10-325 MG tablet Commonly known as:  NORCO Take 0.5 tablets by mouth 2 (two) times daily as needed.   macitentan 10 MG tablet Commonly known as:  Opsumit Take 1 tablet (10 mg total) by mouth daily.   nitroGLYCERIN 0.4 MG SL tablet Commonly known as:  Nitrostat Place 1 tablet (0.4 mg total) under the tongue every 5 (five) minutes as needed. What changed:  reasons to take this   pantoprazole 40 MG tablet Commonly known as:  PROTONIX Take 1 tablet (40 mg total) by mouth 2 (two) times daily. What changed:  when to take this   sildenafil 20 MG tablet Commonly known as:  REVATIO Take 1 tablet (20 mg total) by mouth 3 (three) times daily.   simvastatin 40 MG tablet Commonly known as:  ZOCOR TAKE 1 TABLET BY MOUTH DAILY AT 6PM What changed:  See the new instructions.   torsemide 20 MG tablet Commonly known as:  DEMADEX Take 1 tablet (20 mg total) by mouth daily. Start taking on:  Sep 01, 2018 What changed:    how much to take  how to take this  when to take this  additional instructions      Follow-up Information    Cirigliano, Vito V, DO Follow up in 8 week(s).   Specialty:  Gastroenterology Contact information: [address] [place] [address] [phone]          No Known Allergies  Consultations: GI Cardiology  Procedures/Studies:  No results found.   Subjective: He is feeling better, more energy , dyspnea improved.   Discharge Exam: Vitals:   08/31/18 1030 08/31/18 1117  BP:  (!) 98/55  Pulse:  70  Resp: 19 18  Temp:  97.7 F (36.5 C)  SpO2:  98%     General: Pt is alert, awake, not in acute distress Cardiovascular: RRR, S1/S2 +, no rubs, no gallops Respiratory: CTA bilaterally, no wheezing, no rhonchi Abdominal: Soft, NT, ND, bowel sounds + Extremities: no edema, no cyanosis    The results of significant diagnostics from this hospitalization (including imaging, microbiology, ancillary and laboratory) are listed below for reference.  Microbiology: No results found for this or any previous visit (from the past 240 hour(s)).   Labs: BNP (last 3 results) Recent Labs    06/11/18 0852 08/13/18 1251 08/26/18 1037  BNP 1,301.6* 359.8* 110.0*   Basic Metabolic Panel: Recent Labs  Lab 08/26/18 1037 08/28/18 2157 08/29/18 0327 08/30/18 0148 08/31/18 0210  NA 142 139 140 142 139  K 3.7 4.5 4.1 3.9 4.1  CL 115* 114* 115* 116* 113*  CO2 15* 16* 15* 16* 15*  GLUCOSE 91 95 75 76 121*  BUN 79* 75* 75* 69* 56*  CREATININE 3.24* 2.73* 2.64* 2.40* 2.21*  CALCIUM 8.0* 8.1* 7.9* 8.2* 8.2*   Liver Function Tests: Recent Labs  Lab 08/29/18 0327  AST 13*  ALT 11  ALKPHOS 71  BILITOT 0.8  PROT 5.8*  ALBUMIN 2.4*   No results for input(s): LIPASE, AMYLASE in the last 168 hours. No results for input(s): AMMONIA in the last 168 hours. CBC: Recent Labs  Lab 08/28/18 1248 08/28/18 2157  08/29/18 0327 08/30/18 0148 08/31/18 0210  WBC 4.5 4.5 4.2 4.1 3.7*  HGB 4.6* 10.4* 9.6* 9.5* 9.3*  HCT 15.2* 31.3* 28.0* 28.6* 28.0*  MCV 98.1 86.5 84.8 86.9 87.2  PLT 185 169 160 178 156   Cardiac Enzymes: No results for input(s): CKTOTAL, CKMB, CKMBINDEX, TROPONINI in the last 168 hours. BNP: Invalid input(s): POCBNP CBG: No results for input(s): GLUCAP in the last 168 hours. D-Dimer No results for input(s): DDIMER in the last 72 hours. Hgb A1c No results for input(s): HGBA1C in the last 72 hours. Lipid Profile No results for input(s): CHOL, HDL, LDLCALC, TRIG, CHOLHDL, LDLDIRECT in the last 72 hours. Thyroid function studies Recent Labs    08/29/18 0327  TSH 4.087   Anemia work up Recent Labs    08/28/18 2157  VITAMINB12 >7,500*  FOLATE 13.6  FERRITIN 90  TIBC 244*  IRON 205*  RETICCTPCT 0.9   Urinalysis    Component Value Date/Time   COLORURINE STRAW (A) 08/28/2018 1900   APPEARANCEUR CLEAR 08/28/2018 1900   LABSPEC 1.006 08/28/2018 1900   PHURINE 5.0 08/28/2018 1900   GLUCOSEU NEGATIVE 08/28/2018 1900   HGBUR NEGATIVE 08/28/2018 1900   [place] 08/28/2018 1900   KETONESUR NEGATIVE 08/28/2018 1900   PROTEINUR NEGATIVE 08/28/2018 1900   NITRITE NEGATIVE 08/28/2018 1900   LEUKOCYTESUR NEGATIVE 08/28/2018 1900   Sepsis Labs Invalid input(s): PROCALCITONIN,  WBC,  LACTICIDVEN Microbiology No results found for this or any previous visit (from the past 240 hour(s)).   Time coordinating discharge: 40 minutes  SIGNED:   Elmarie Shiley, MD  [facility]

## 2018-08-31 NOTE — Progress Notes (Signed)
Patient ID: [name], male   DOB: Aug 04, 1960, 58 y.o.   MRN: [id number]    Advanced Heart Failure Rounding Note   Subjective:    No complaint this morning, ready to go home. No further overt GI bleeding. Hgb stable at 9.3.  Creatinine improving.   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   Objective:   Weight Range:  Vital Signs:   Temp:  [97.3 F (36.3 C)-98.2 F (36.8 C)] 98 F (36.7 C) (05/09 0737) Pulse Rate:  [61-81] 81 (05/09 0737) Resp:  [11-22] 20 (05/09 0737) BP: (87-112)/(45-72) 112/68 (05/09 0737) SpO2:  [91 %-100 %] 92 % (05/09 0737) Weight:  [58 kg] 58 kg (05/08 1030) Last BM Date: 08/30/18  Weight change: Filed Weights   08/28/18 0900 08/30/18 1030  Weight: 58 kg 58 kg    Intake/Output:   Intake/Output Summary (Last 24 hours) at 08/31/2018 1002 Last data filed at 08/31/2018 0759 Gross per 24 hour  Intake 980 ml  Output 250 ml  Net 730 ml     Physical Exam: General: NAD Neck: No JVD, no thyromegaly or thyroid nodule.  Lungs: Clear to auscultation bilaterally with normal respiratory effort. CV: Nondisplaced PMI.  Heart regular S1/S2, no S3/S4, 1/6 HSM LLSB.  No peripheral edema.   Abdomen: Soft, nontender, no hepatosplenomegaly, no distention.  Skin: Intact without lesions or rashes.  Neurologic: Alert and oriented x 3.  Psych: Normal affect. Extremities: No clubbing or cyanosis.  HEENT: Normal.   Telemetry: Sinus 70-80s Personally reviewed   Labs: Basic Metabolic Panel: Recent Labs  Lab 08/26/18 1037 08/28/18 2157 08/29/18 0327 08/30/18 0148 08/31/18 0210  NA 142 139 140 142 139  K 3.7 4.5 4.1 3.9 4.1  CL 115* 114* 115* 116* 113*  CO2 15* 16* 15* 16* 15*  GLUCOSE 91 95 75 76 121*  BUN 79* 75* 75* 69* 56*  CREATININE 3.24* 2.73* 2.64* 2.40* 2.21*  CALCIUM 8.0* 8.1* 7.9* 8.2* 8.2*    Liver Function Tests: Recent Labs  Lab 08/29/18 0327  AST 13*  ALT 11  ALKPHOS 71  BILITOT 0.8   PROT 5.8*  ALBUMIN 2.4*   No results for input(s): LIPASE, AMYLASE in the last 168 hours. No results for input(s): AMMONIA in the last 168 hours.  CBC: Recent Labs  Lab 08/28/18 1248 08/28/18 2157 08/29/18 0327 08/30/18 0148 08/31/18 0210  WBC 4.5 4.5 4.2 4.1 3.7*  HGB 4.6* 10.4* 9.6* 9.5* 9.3*  HCT 15.2* 31.3* 28.0* 28.6* 28.0*  MCV 98.1 86.5 84.8 86.9 87.2  PLT 185 169 160 178 156    Cardiac Enzymes: No results for input(s): CKTOTAL, CKMB, CKMBINDEX, TROPONINI in the last 168 hours.  BNP: BNP (last 3 results) Recent Labs    06/11/18 0852 08/13/18 1251 08/26/18 1037  BNP 1,301.6* 359.8* 342.8*    ProBNP (last 3 results) Recent Labs    01/15/18 1509  PROBNP 4,482*      Other results:  Imaging: No results found.   Medications:     Scheduled Medications: . allopurinol  100 mg Oral Daily  . macitentan  10 mg Oral Daily  . nicotine  21 mg Transdermal Daily  . pantoprazole  40 mg Oral BID  . sildenafil  20 mg Oral TID  . simvastatin  40 mg Oral q1800  . sodium chloride flush  3 mL Intravenous Q12H    Infusions:   PRN Medications: acetaminophen **OR** acetaminophen, albuterol, fluticasone, HYDROcodone-acetaminophen, LORazepam, ondansetron **OR** ondansetron (  ZOFRAN) IV, sodium chloride flush   Assessment:   Shin Lamour Striblinis a 58 y.o.male with h/o scleroderma (diagnosed early 2000s), PAH, ETOH/tobacco abuse, HTN, CAD s/p MI (s/p dRCA stent 2010) and previous pericardial effusion s/p pericardial window in 2010 by Dr. [name] admitted with symptomatic anemia likely due to UGIB   Plan/Discussion:    1. Symptomatic anemia due to UGIB - Hgb 6.5 on initial check. Repeat 4.9 with normal MCV - Several week h/o melena.  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  - currently hemodynamically stable - Hgb stable today at 9.3.   - Would stay off ASA for now (was only taking twice a week).    2. Pulmonary HTN with recurrent large pericardial effusion - due to his scleroderma and related PAH (WHO Group I) - R/LHC 07/01/18: mod to severe PAH, no evidence of tamponade. PA 81/26 (48), PCW 17, PVR 6.0 -Need todiscuss with Rheumatology regarding need for treatment of underlying CTD - Continue sildenafil 20 mg TID - Continue opsumit 10 mg daily  3. 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  - Volume status stable today, creatinine trending down.  Can restart torsemide 20 mg daily.  - Continue spiro 25 mg dailyfor now. - Discussed limiting fluid and salt intake.  4. Acute on CKD III - Baseline creatinine ~2.0 - Creatinine up to 3.24 but back to 2.21 today. Can restart torsemide.   5. CAD - s/p previous RCA stent. - LHC 07/01/18: Non obstructive CAD with patent RCA stent. - Hold ASA for now with multiple bleeding AVMs (he was only taking twice a week).   -No s/s ischemia  6. Scleroderma - needs f/u with Rheum. Has not seen recently.Unable to see until after COVID crisis  7. Tobacco use -smoking 3/4 ppd. Encouraged cessationagain today.  Stable from my standpoint for discharge if no further overt bleeding.    Length of Stay: 2   Loralie Champagne MD 08/31/2018, 10:02 AM  Advanced Heart Failure Team Pager [phone] (M-F; 7a - 4p)  Please contact [facility] for night-coverage after hours (4p -7a ) and weekends on [email or web address]

## 2018-08-31 NOTE — Progress Notes (Signed)
Pt got discharged to home, discharge instructions provided and patient showed understanding to it, IV taken out,Telemonitor DC,pt left unit in wheelchair with all of the belongings accompanied with a family member (wife)  Turner Baillie,RN 

## 2018-08-31 NOTE — Plan of Care (Signed)

## 2018-08-31 NOTE — Progress Notes (Signed)
[facility] ROUNDING NOTE   Subjective: No acute events overnight.  Tolerating p.o. intake without any complaints.  No overt GI blood loss.  He is hoping that he can go home today.  EGD and colonoscopy completed yesterday.   Objective: Vital signs in last 24 hours: Temp:  [97.3 F (36.3 C)-98 F (36.7 C)] 97.7 F (36.5 C) (05/09 1117) Pulse Rate:  [61-81] 70 (05/09 1117) Resp:  [11-22] 18 (05/09 1117) BP: (87-112)/(45-72) 98/55 (05/09 1117) SpO2:  [91 %-100 %] 98 % (05/09 1117) Last BM Date: 08/30/18 General: NAD Lungs: CTA bilaterally Heart: RRR Abdomen: Soft, NT, ND   Intake/Output from previous day: 05/08 0701 - 05/09 0700 In: 740 [P.O.:240; I.V.:500] Out: 650 [Urine:650] Intake/Output this shift: Total I/O In: 240 [P.O.:240] Out: 0    Lab Results: Recent Labs    08/29/18 0327 08/30/18 0148 08/31/18 0210  WBC 4.2 4.1 3.7*  HGB 9.6* 9.5* 9.3*  PLT 160 178 156  MCV 84.8 86.9 87.2   BMET Recent Labs    08/29/18 0327 08/30/18 0148 08/31/18 0210  NA 140 142 139  K 4.1 3.9 4.1  CL 115* 116* 113*  CO2 15* 16* 15*  GLUCOSE 75 76 121*  BUN 75* 69* 56*  CREATININE 2.64* 2.40* 2.21*  CALCIUM 7.9* 8.2* 8.2*   LFT Recent Labs    08/29/18 0327  PROT 5.8*  ALBUMIN 2.4*  AST 13*  ALT 11  ALKPHOS 71  BILITOT 0.8  BILIDIR 0.2  IBILI 0.6   PT/INR No results for input(s): INR in the last 72 hours.    Imaging/Other results: No results found.    Assessment and Plan:  1) GI bleed 2) Arteriovenous malformation 3) Posthemorrhagic anemia/symptomatic anemia  EGD and colonoscopy completed on 08/30/2018.  EGD notable for gastritis without active bleeding (biopsies pending), 4 duodenal AVMs treated with APC, an 8 mm polypoid lesion in the duodenum (biopsied, collapsed with biopsy consistent with cyst), and an area of nodularity noted in the second portion of the duodenum (biopsies obtained; appearance ?c/w flat adenoma).  Colonoscopy notable  for multiple AVMs noted in the sigmoid, descending, ascending colon with a large cluster of actively bleeding AVMs noted in the cecum.  All were treated with APC with cessation of bleeding.  12 mm pedunculated polyp also removed.  Hemoglobin stable and tolerating p.o. intake.  No further GIB.  - Okay to advance diet as tolerated to heart healthy diet - Continue PPI as prescribed to promote gastric healing and clot formation in the UGI tract -Plan for repeat upper endoscopy in 6 to 8 weeks - Information sent to our scheduler to contact to schedule follow-up appointment with me in the clinic in the next 1 to 2 weeks -Plan for repeat colonoscopy in approximately 6 months due to poor bowel prep and plan to rule out additional polyps.  Can also treat any new AVMs at that time -Okay to discharge to home from a GI standpoint - Seen by cardiology this morning and plan to hold aspirin     Lavena Bullion, DO  08/31/2018, 12:01 PM [facility] Pager [phone]

## 2018-09-02 ENCOUNTER — Encounter (HOSPITAL_COMMUNITY): Payer: Self-pay | Admitting: Gastroenterology

## 2018-09-03 ENCOUNTER — Other Ambulatory Visit: Payer: Self-pay

## 2018-09-03 ENCOUNTER — Other Ambulatory Visit (INDEPENDENT_AMBULATORY_CARE_PROVIDER_SITE_OTHER): Payer: 59

## 2018-09-03 DIAGNOSIS — J90 Pleural effusion, not elsewhere classified: Secondary | ICD-10-CM | POA: Diagnosis not present

## 2018-09-03 DIAGNOSIS — K625 Hemorrhage of anus and rectum: Secondary | ICD-10-CM | POA: Diagnosis not present

## 2018-09-03 DIAGNOSIS — I5033 Acute on chronic diastolic (congestive) heart failure: Secondary | ICD-10-CM | POA: Diagnosis not present

## 2018-09-03 DIAGNOSIS — D62 Acute posthemorrhagic anemia: Secondary | ICD-10-CM | POA: Diagnosis not present

## 2018-09-03 LAB — CBC WITH DIFFERENTIAL/PLATELET
Basophils Absolute: 0 10*3/uL (ref 0.0–0.1)
Basophils Relative: 0.6 % (ref 0.0–3.0)
Eosinophils Absolute: 0.1 10*3/uL (ref 0.0–0.7)
Eosinophils Relative: 1.9 % (ref 0.0–5.0)
HCT: 27.3 % — ABNORMAL LOW (ref 39.0–52.0)
Hemoglobin: 9.2 g/dL — ABNORMAL LOW (ref 13.0–17.0)
Lymphocytes Relative: 12.6 % (ref 12.0–46.0)
Lymphs Abs: 0.8 10*3/uL (ref 0.7–4.0)
MCHC: 33.9 g/dL (ref 30.0–36.0)
MCV: 89.6 fl (ref 78.0–100.0)
Monocytes Absolute: 0.5 10*3/uL (ref 0.1–1.0)
Monocytes Relative: 7.6 % (ref 3.0–12.0)
Neutro Abs: 5 10*3/uL (ref 1.4–7.7)
Neutrophils Relative %: 77.3 % — ABNORMAL HIGH (ref 43.0–77.0)
Platelets: 152 10*3/uL (ref 150.0–400.0)
RBC: 3.04 Mil/uL — ABNORMAL LOW (ref 4.22–5.81)
RDW: 18.8 % — ABNORMAL HIGH (ref 11.5–15.5)
WBC: 6.5 10*3/uL (ref 4.0–10.5)

## 2018-09-03 NOTE — Addendum Note (Signed)
Addended by: Caffie Pinto on: 09/03/2018 01:14 PM   Modules accepted: Orders

## 2018-09-03 NOTE — Progress Notes (Signed)
Patient will come to [facility] office today to have labs drawn.

## 2018-09-04 ENCOUNTER — Telehealth (INDEPENDENT_AMBULATORY_CARE_PROVIDER_SITE_OTHER): Payer: 59 | Admitting: Gastroenterology

## 2018-09-04 ENCOUNTER — Encounter: Payer: Self-pay | Admitting: Gastroenterology

## 2018-09-04 VITALS — Ht 68.5 in | Wt 115.5 lb

## 2018-09-04 DIAGNOSIS — I519 Heart disease, unspecified: Secondary | ICD-10-CM

## 2018-09-04 DIAGNOSIS — D62 Acute posthemorrhagic anemia: Secondary | ICD-10-CM

## 2018-09-04 DIAGNOSIS — D126 Benign neoplasm of colon, unspecified: Secondary | ICD-10-CM

## 2018-09-04 DIAGNOSIS — K5521 Angiodysplasia of colon with hemorrhage: Secondary | ICD-10-CM | POA: Diagnosis not present

## 2018-09-04 DIAGNOSIS — K2970 Gastritis, unspecified, without bleeding: Secondary | ICD-10-CM

## 2018-09-04 DIAGNOSIS — K5520 Angiodysplasia of colon without hemorrhage: Secondary | ICD-10-CM

## 2018-09-04 DIAGNOSIS — I509 Heart failure, unspecified: Secondary | ICD-10-CM
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
## 2018-09-04 NOTE — Patient Instructions (Addendum)
If you are age 60 or older, your body mass index should be between 23-30. Your Body mass index is 17.31 kg/m. If this is out of the aforementioned range listed, please consider follow up with your Primary Care Provider.  If you are age 65 or younger, your body mass index should be between 19-25. Your Body mass index is 17.31 kg/m. If this is out of the aformentioned range listed, please consider follow up with your Primary Care Provider.    You will be due for a recall colonoscopy in 02/2019. We will send you a reminder in the mail when it gets closer to that time.   It has been recommended to you by your physician that you have a(n) EGD completed. We did not schedule the procedure(s) today.  Our office will contact you to have this set up.    Follow up 6 months.  It was a pleasure to see you today!  Vito Cirigliano, D.O.

## 2018-09-14 ENCOUNTER — Inpatient Hospital Stay (HOSPITAL_COMMUNITY): Payer: 59

## 2018-09-14 ENCOUNTER — Other Ambulatory Visit: Payer: Self-pay

## 2018-09-14 ENCOUNTER — Emergency Department (HOSPITAL_COMMUNITY): Payer: 59

## 2018-09-14 ENCOUNTER — Inpatient Hospital Stay (HOSPITAL_COMMUNITY)
Admission: EM | Admit: 2018-09-14 | Discharge: 2018-09-17 | DRG: 357 | Disposition: A | Discharge status: Home / Self Care | Payer: 59 | Attending: Pulmonary Disease | Admitting: Pulmonary Disease

## 2018-09-14 DIAGNOSIS — M109 Gout, unspecified: Secondary | ICD-10-CM | POA: Diagnosis present

## 2018-09-14 DIAGNOSIS — N184 Chronic kidney disease, stage 4 (severe): Secondary | ICD-10-CM | POA: Diagnosis present

## 2018-09-14 DIAGNOSIS — Z1159 Encounter for screening for other viral diseases: Secondary | ICD-10-CM

## 2018-09-14 DIAGNOSIS — K921 Melena: Secondary | ICD-10-CM | POA: Diagnosis present

## 2018-09-14 DIAGNOSIS — E785 Hyperlipidemia, unspecified: Secondary | ICD-10-CM | POA: Diagnosis present

## 2018-09-14 DIAGNOSIS — I130 Hypertensive heart and chronic kidney disease with heart failure and stage 1 through stage 4 chronic kidney disease, or unspecified chronic kidney disease: Secondary | ICD-10-CM | POA: Diagnosis present

## 2018-09-14 DIAGNOSIS — F17200 Nicotine dependence, unspecified, uncomplicated: Secondary | ICD-10-CM | POA: Diagnosis present

## 2018-09-14 DIAGNOSIS — I313 Pericardial effusion (noninflammatory): Secondary | ICD-10-CM | POA: Diagnosis present

## 2018-09-14 DIAGNOSIS — K31819 Angiodysplasia of stomach and duodenum without bleeding: Secondary | ICD-10-CM | POA: Diagnosis present

## 2018-09-14 DIAGNOSIS — I7300 Raynaud's syndrome without gangrene: Secondary | ICD-10-CM | POA: Diagnosis present

## 2018-09-14 DIAGNOSIS — E039 Hypothyroidism, unspecified: Secondary | ICD-10-CM | POA: Diagnosis present

## 2018-09-14 DIAGNOSIS — M349 Systemic sclerosis, unspecified: Secondary | ICD-10-CM | POA: Diagnosis present

## 2018-09-14 DIAGNOSIS — N181 Chronic kidney disease, stage 1: Secondary | ICD-10-CM | POA: Diagnosis not present

## 2018-09-14 DIAGNOSIS — F419 Anxiety disorder, unspecified: Secondary | ICD-10-CM | POA: Diagnosis present

## 2018-09-14 DIAGNOSIS — I2510 Atherosclerotic heart disease of native coronary artery without angina pectoris: Secondary | ICD-10-CM | POA: Diagnosis present

## 2018-09-14 DIAGNOSIS — I493 Ventricular premature depolarization: Secondary | ICD-10-CM | POA: Diagnosis present

## 2018-09-14 DIAGNOSIS — N170 Acute kidney failure with tubular necrosis: Secondary | ICD-10-CM | POA: Diagnosis not present

## 2018-09-14 DIAGNOSIS — F1010 Alcohol abuse, uncomplicated: Secondary | ICD-10-CM | POA: Diagnosis present

## 2018-09-14 DIAGNOSIS — F17210 Nicotine dependence, cigarettes, uncomplicated: Secondary | ICD-10-CM | POA: Diagnosis present

## 2018-09-14 DIAGNOSIS — E861 Hypovolemia: Secondary | ICD-10-CM | POA: Diagnosis present

## 2018-09-14 DIAGNOSIS — K922 Gastrointestinal hemorrhage, unspecified: Secondary | ICD-10-CM | POA: Diagnosis not present

## 2018-09-14 DIAGNOSIS — I252 Old myocardial infarction: Secondary | ICD-10-CM

## 2018-09-14 DIAGNOSIS — D649 Anemia, unspecified: Secondary | ICD-10-CM | POA: Diagnosis present

## 2018-09-14 DIAGNOSIS — Z955 Presence of coronary angioplasty implant and graft: Secondary | ICD-10-CM

## 2018-09-14 DIAGNOSIS — G8929 Other chronic pain: Secondary | ICD-10-CM | POA: Diagnosis present

## 2018-09-14 DIAGNOSIS — I5032 Chronic diastolic (congestive) heart failure: Secondary | ICD-10-CM | POA: Diagnosis present

## 2018-09-14 DIAGNOSIS — Z79899 Other long term (current) drug therapy: Secondary | ICD-10-CM

## 2018-09-14 DIAGNOSIS — N189 Chronic kidney disease, unspecified: Secondary | ICD-10-CM | POA: Diagnosis present

## 2018-09-14 DIAGNOSIS — D500 Iron deficiency anemia secondary to blood loss (chronic): Secondary | ICD-10-CM | POA: Diagnosis not present

## 2018-09-14 DIAGNOSIS — K91840 Postprocedural hemorrhage and hematoma of a digestive system organ or structure following a digestive system procedure: Secondary | ICD-10-CM | POA: Diagnosis not present

## 2018-09-14 DIAGNOSIS — D631 Anemia in chronic kidney disease: Secondary | ICD-10-CM | POA: Diagnosis present

## 2018-09-14 DIAGNOSIS — N183 Chronic kidney disease, stage 3 (moderate): Secondary | ICD-10-CM | POA: Diagnosis present

## 2018-09-14 DIAGNOSIS — D62 Acute posthemorrhagic anemia: Secondary | ICD-10-CM | POA: Diagnosis present

## 2018-09-14 DIAGNOSIS — I272 Pulmonary hypertension, unspecified: Secondary | ICD-10-CM | POA: Diagnosis not present

## 2018-09-14 DIAGNOSIS — I2781 Cor pulmonale (chronic): Secondary | ICD-10-CM | POA: Diagnosis present

## 2018-09-14 DIAGNOSIS — I959 Hypotension, unspecified: Secondary | ICD-10-CM | POA: Diagnosis present

## 2018-09-14 DIAGNOSIS — I2721 Secondary pulmonary arterial hypertension: Secondary | ICD-10-CM | POA: Diagnosis present

## 2018-09-14 DIAGNOSIS — N179 Acute kidney failure, unspecified: Secondary | ICD-10-CM | POA: Diagnosis present

## 2018-09-14 DIAGNOSIS — K625 Hemorrhage of anus and rectum: Secondary | ICD-10-CM | POA: Diagnosis not present

## 2018-09-14 DIAGNOSIS — Z841 Family history of disorders of kidney and ureter: Secondary | ICD-10-CM

## 2018-09-14 DIAGNOSIS — K5521 Angiodysplasia of colon with hemorrhage: Principal | ICD-10-CM | POA: Diagnosis present

## 2018-09-14 HISTORY — PX: IR US GUIDE VASC ACCESS RIGHT: IMG2390

## 2018-09-14 HISTORY — PX: IR EMBO ART  VEN HEMORR LYMPH EXTRAV  INC GUIDE ROADMAPPING: IMG5450

## 2018-09-14 HISTORY — PX: IR ANGIOGRAM VISCERAL SELECTIVE: IMG657

## 2018-09-14 HISTORY — PX: IR ANGIOGRAM SELECTIVE EACH ADDITIONAL VESSEL: IMG667

## 2018-09-14 LAB — COMPREHENSIVE METABOLIC PANEL
ALT: 13 U/L (ref 0–44)
AST: 16 U/L (ref 15–41)
Albumin: 2.8 g/dL — ABNORMAL LOW (ref 3.5–5.0)
Alkaline Phosphatase: 67 U/L (ref 38–126)
Anion gap: 11 (ref 5–15)
BUN: 60 mg/dL — ABNORMAL HIGH (ref 6–20)
CO2: 19 mmol/L — ABNORMAL LOW (ref 22–32)
Calcium: 7.2 mg/dL — ABNORMAL LOW (ref 8.9–10.3)
Chloride: 108 mmol/L (ref 98–111)
Creatinine, Ser: 2.51 mg/dL — ABNORMAL HIGH (ref 0.61–1.24)
GFR calc Af Amer: 31 mL/min — ABNORMAL LOW (ref >60)
GFR calc non Af Amer: 27 mL/min — ABNORMAL LOW (ref >60)
Glucose, Bld: 97 mg/dL (ref 70–99)
Potassium: 2.9 mmol/L — ABNORMAL LOW (ref 3.5–5.1)
Sodium: 138 mmol/L (ref 135–145)
Total Bilirubin: 0.6 mg/dL (ref 0.3–1.2)
Total Protein: 6.3 g/dL — ABNORMAL LOW (ref 6.5–8.1)

## 2018-09-14 LAB — CBC WITH DIFFERENTIAL/PLATELET
Abs Immature Granulocytes: 0.03 10*3/uL (ref 0.00–0.07)
Basophils Absolute: 0 10*3/uL (ref 0.0–0.1)
Basophils Relative: 0 %
Eosinophils Absolute: 0.2 10*3/uL (ref 0.0–0.5)
Eosinophils Relative: 3 %
HCT: 24.1 % — ABNORMAL LOW (ref 39.0–52.0)
Hemoglobin: 7.5 g/dL — ABNORMAL LOW (ref 13.0–17.0)
Immature Granulocytes: 0 %
Lymphocytes Relative: 14 %
Lymphs Abs: 1 10*3/uL (ref 0.7–4.0)
MCH: 28.8 pg (ref 26.0–34.0)
MCHC: 31.1 g/dL (ref 30.0–36.0)
MCV: 92.7 fL (ref 80.0–100.0)
Monocytes Absolute: 0.4 10*3/uL (ref 0.1–1.0)
Monocytes Relative: 6 %
Neutro Abs: 5.7 10*3/uL (ref 1.7–7.7)
Neutrophils Relative %: 77 %
Platelets: 127 10*3/uL — ABNORMAL LOW (ref 150–400)
RBC: 2.6 MIL/uL — ABNORMAL LOW (ref 4.22–5.81)
RDW: 17.4 % — ABNORMAL HIGH (ref 11.5–15.5)
WBC: 7.3 10*3/uL (ref 4.0–10.5)
nRBC: 0 % (ref 0.0–0.2)

## 2018-09-14 LAB — SARS CORONAVIRUS 2 BY RT PCR (HOSPITAL ORDER, PERFORMED IN ~~LOC~~ HOSPITAL LAB): SARS Coronavirus 2: NEGATIVE

## 2018-09-14 LAB — PREPARE RBC (CROSSMATCH)

## 2018-09-14 LAB — PROTIME-INR
INR: 1.2 (ref 0.8–1.2)
Prothrombin Time: 14.6 seconds (ref 11.4–15.2)

## 2018-09-14 IMAGING — CT CT ANGIOGRAPHY ABDOMEN AND PELVIS WITH CONTRAST AND WITHOUT CONT
3 of 13 series · 10 of 46 positions shown, 16 images · IV contrast (APPLIED)
Comparison: [DATE]

CLINICAL DATA: Lower GI bleed, history of AVMs

EXAM:
CTA ABDOMEN AND PELVIS WITHOUT AND WITH CONTRAST
TECHNIQUE: Multidetector CT imaging of the abdomen and pelvis was performed
using the standard protocol during bolus administration of
intravenous contrast. Multiplanar reconstructed images and MIPs were
obtained and reviewed to evaluate the vascular anatomy.
CONTRAST:  80mL OMNIPAQUE 350

[Series 10: arterial 3.0 i40f 2 · axial · arterial · 0.76mm/px · z∈[-546,-423]mm · 3 of 163 slices shown]
[im 14/163  soft-tissue]
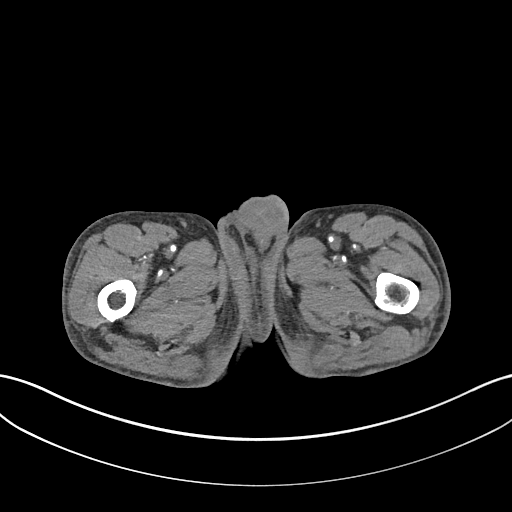
[im 41/163  soft-tissue]
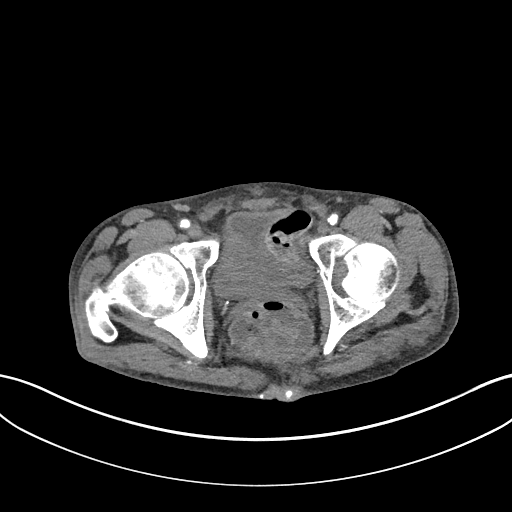
[im 55/163  soft-tissue]
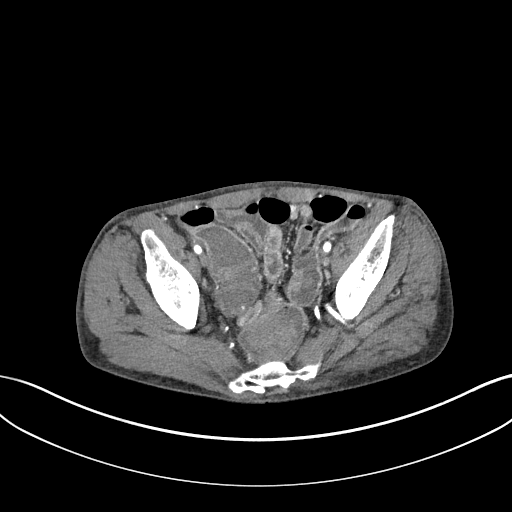

[Series 12: coronals · coronal · 0.69mm/px · 1 of 117 slices shown, 2 images]
[im 59/117  soft-tissue]
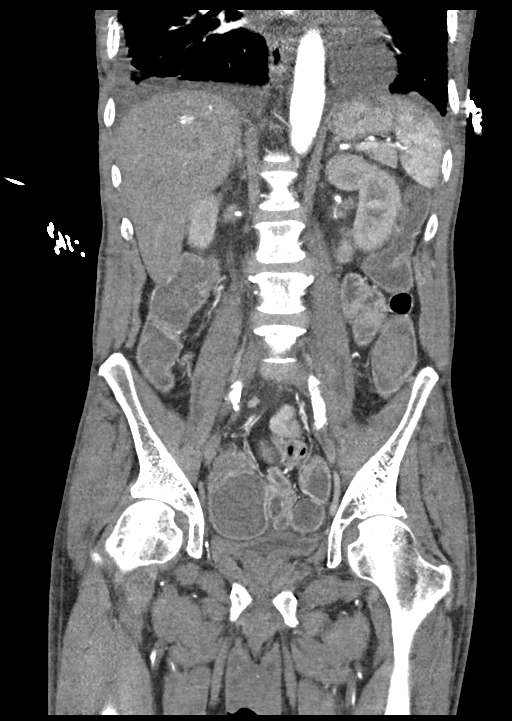
[im 59/117  bone]
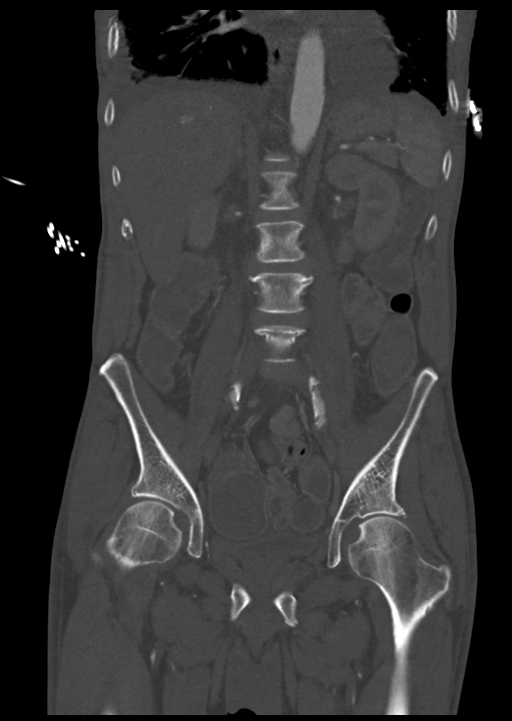

[Series 16: venous 5.0 i30f 1 · axial · portal-venous · 0.65mm/px · z∈[-519,-169]mm · 6 of 98 slices shown, 11 images]
[im 14/98  soft-tissue]
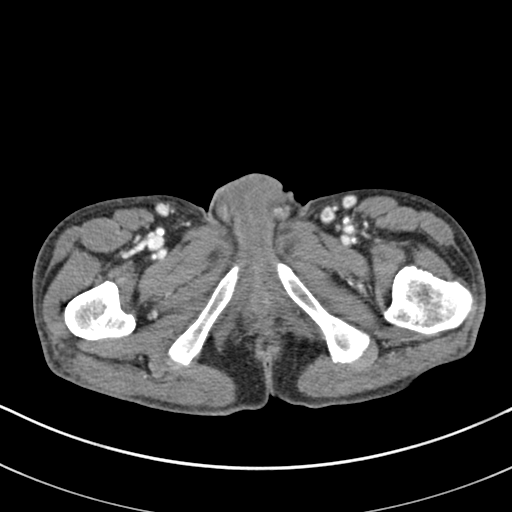
[im 14/98  bone]
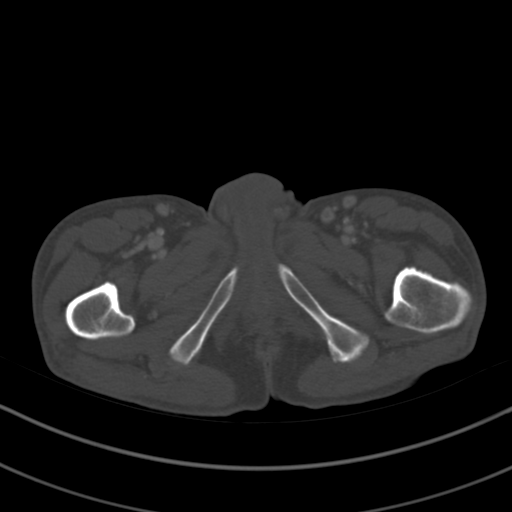
[im 28/98  soft-tissue]
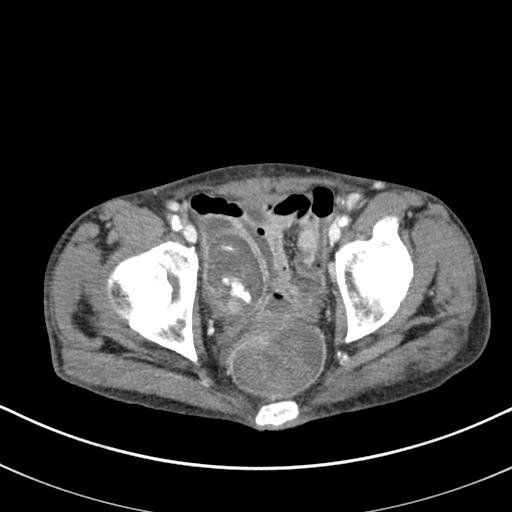
[im 42/98  soft-tissue]
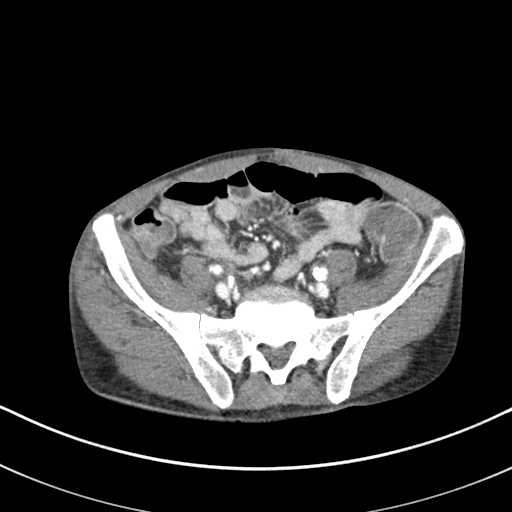
[im 42/98  lung]
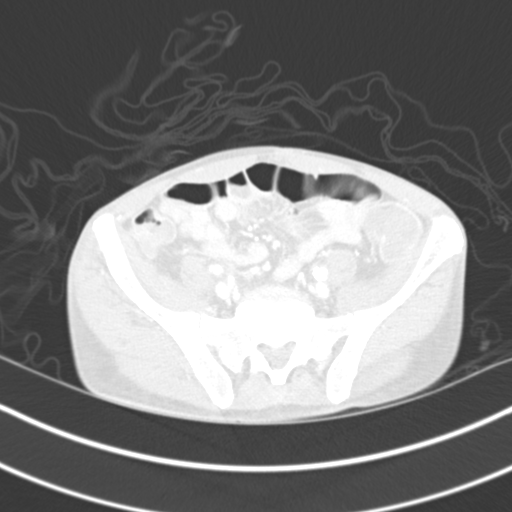
[im 56/98  soft-tissue]
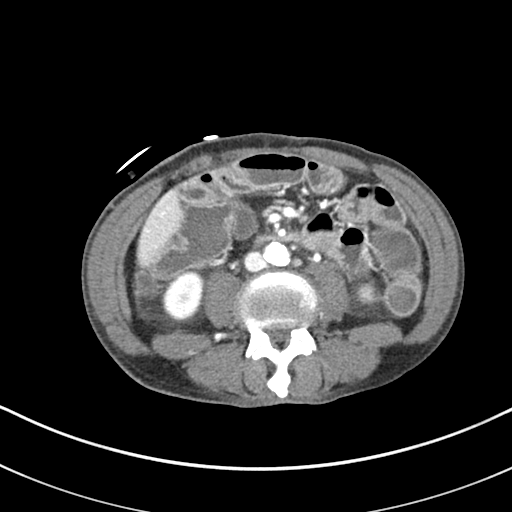
[im 56/98  lung]
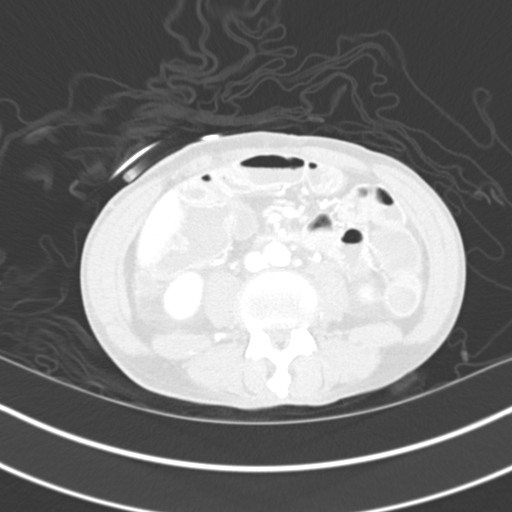
[im 70/98  soft-tissue]
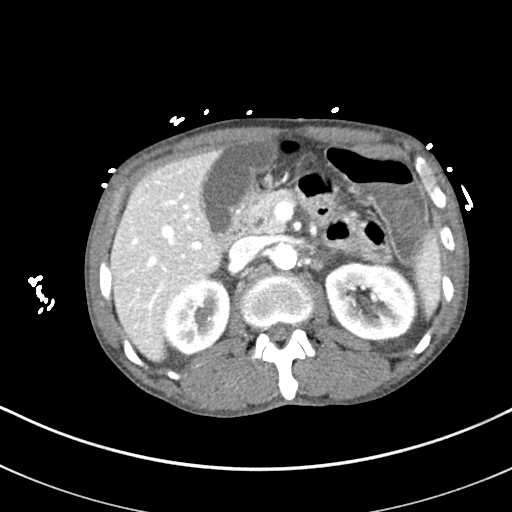
[im 70/98  lung]
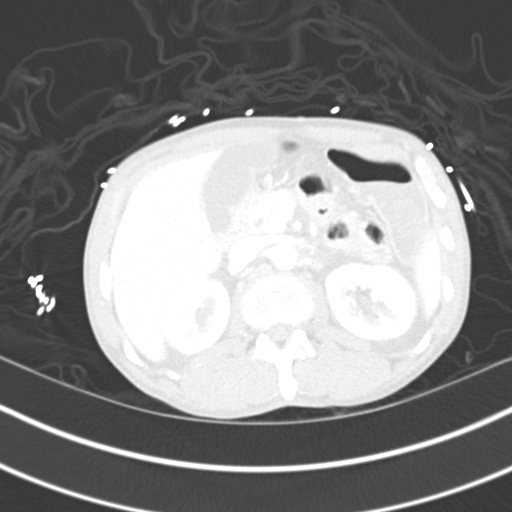
[im 84/98  soft-tissue]
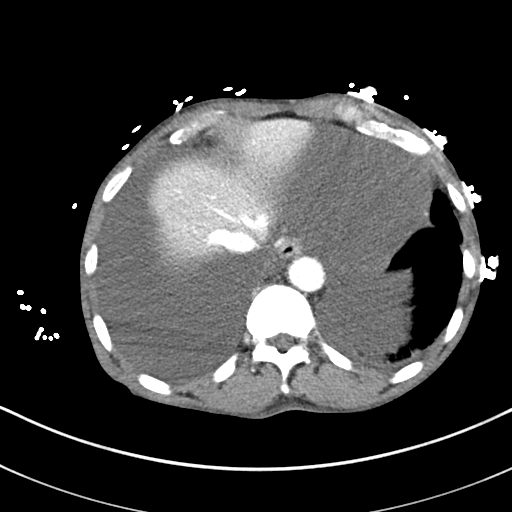
[im 84/98  lung]
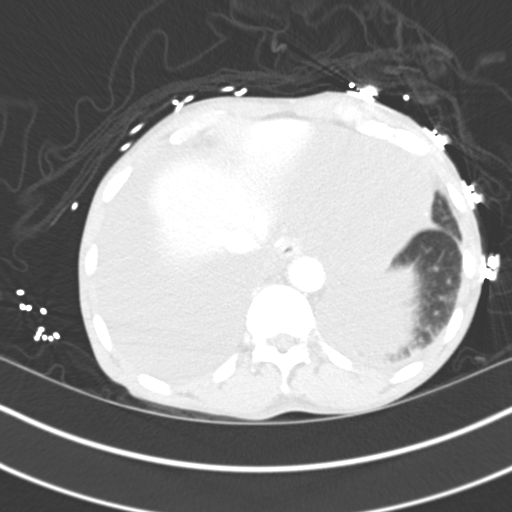

[10 of 46 positions shown; findings below may reference images not displayed]

FINDINGS: VASCULAR

Aorta: Initial precontrast images demonstrate heavy atherosclerotic
calcifications of the aorta and its branches. No aneurysmal
dilatation is noted. No evidence of dissection is seen on
postcontrast images.

Celiac: Variant anatomy of the celiac axis is noted with what
appears to be the right hepatic artery arising directly from the
aorta. Left gastric appears to arise directly from the aorta as
well. The splenic artery also rises directly from the aorta.
Scattered atherosclerotic changes are seen.

SMA: Widely patent.

Renals: Single renal arteries are identified bilaterally with mild
atherosclerotic change. No focal stenosis is seen.

IMA: IMA is widely patent.

Iliacs: Scattered atherosclerotic changes are noted in the iliac
arteries although no focal stenosis is seen. No dissection is
identified.

Veins: No specific venous abnormality is noted. Delayed portal
venous phase images show no specific venous abnormality.

Review of the MIP images confirms the above findings.

NON-VASCULAR

Lower chest: Bilateral pleural effusions are seen right considerably
greater than left. Some associated atelectatic changes are noted.
Large pericardial effusion is seen. This appears stable when compare
with the prior exam.

Hepatobiliary: No focal liver abnormality is seen. No gallstones,
gallbladder wall thickening, or biliary dilatation.

Pancreas: Unremarkable. No pancreatic ductal dilatation or
surrounding inflammatory changes.

Spleen: Normal in size without focal abnormality.

Adrenals/Urinary Tract: Adrenal glands are within normal limits.
Kidneys are well visualized bilaterally. No renal calculi or
obstructive changes are seen. Normal enhancement is noted of the
kidneys.

Stomach/Bowel: Scattered fluid is noted throughout the colon. No
obstructive or inflammatory changes are seen. On the arterial phase
images there is a mural focus of contrast extravasation consistent
with active arterial hemorrhage. Pooling in the cecum is identified.
Delayed images demonstrate further active bleeding collecting within
the cecum. The appendix is not well visualized. Small bowel and
stomach appear within normal limits.

Lymphatic: No significant lymphadenopathy is seen.

Reproductive: Prostate is within normal limits.

Other: No free fluid or focal herniation is noted.

Musculoskeletal: Degenerative changes of lumbar spine are seen.
IMPRESSION: VASCULAR

Atherosclerotic changes are seen. Some variant anatomy of the celiac
axis is noted.

No aneurysmal dilatation or dissection is noted.

NON-VASCULAR

Area of active arterial hemorrhage within the cecum as described
above. This may represent a small AVM given the patient's clinical
history. No other areas of active extravasation are seen. Delayed
venous images demonstrate continued acute hemorrhage.

Large pericardial effusion which appears stable from the prior
study.

Large right pleural effusion.

Critical Value/emergent results were called by telephone at the time
of interpretation on [DATE] at [DATE] to Dr. BRAMBILA with IR,
who verbally acknowledged these results. Additionally Dr. BRAMBILA in
the ER was notified.

## 2018-09-14 IMAGING — XA SELECTIVE VISCERAL ARTERIOGRAPHY
1 series · 13 of 24 positions shown · IV contrast (IODINE)
Comparison: none

CLINICAL DATA: duodenal and colonic AVMs noted on endoscopy and
colonoscopy, presents to ED with bright red blood per rectum,
hypotension, anemia. The patient had a colonoscopy & EGD on [DATE]
with findings of bleeding proximal colon AVM's, proximal small bowel
AVM's. His Hgb was ~ 9 at time of discharge. Before that admit,
his Hgb was 4.6. Chronic renal insufficiency. Low-dose CTA was
obtained for better localization, demonstrating active extravasation
in the cecum. We are asked to see the patient for mesenteric
arteriography and possible embolization.

[Series 300: dsa body · 13 of 211 slices shown]
[im 1/211]
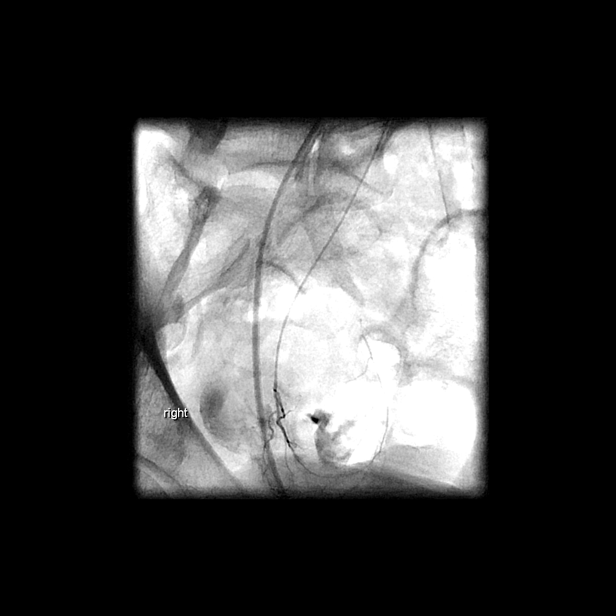
[im 19/211]
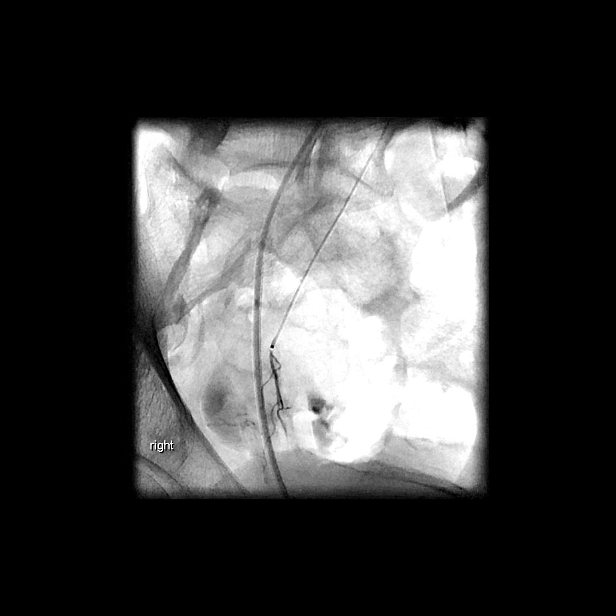
[im 37/211]
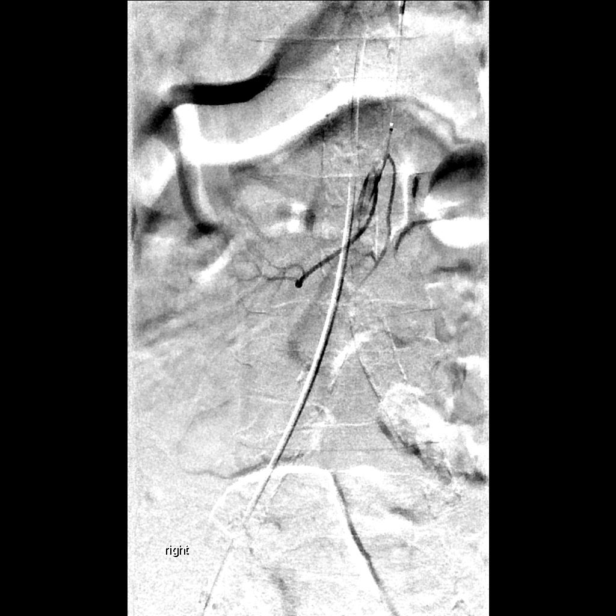
[im 55/211]
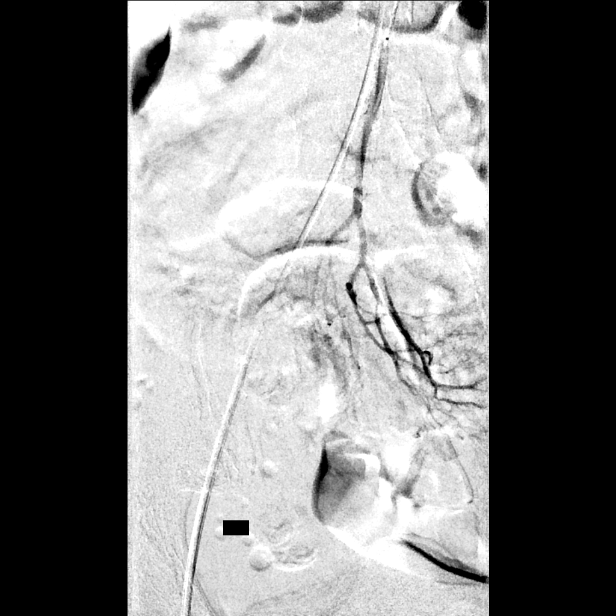
[im 74/211]
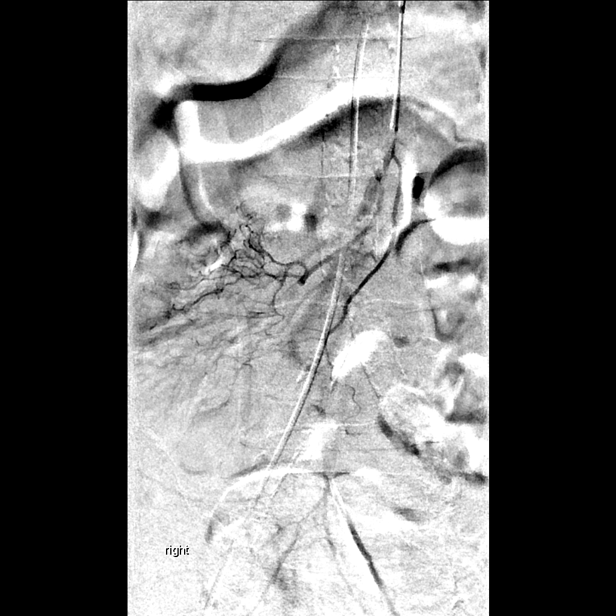
[im 92/211]
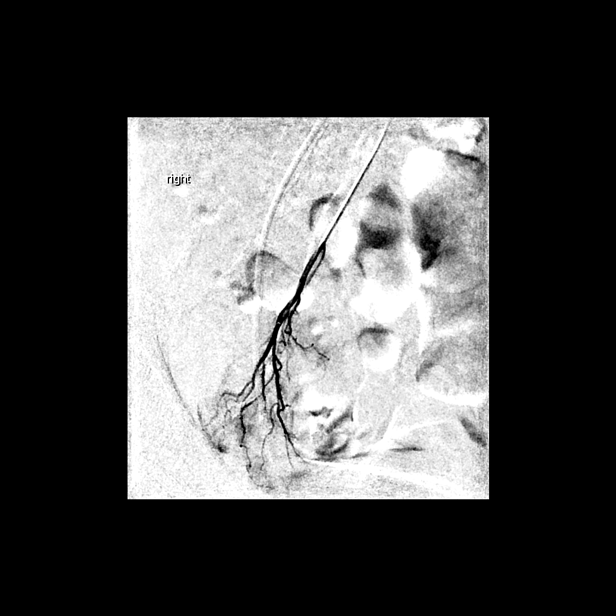
[im 110/211]
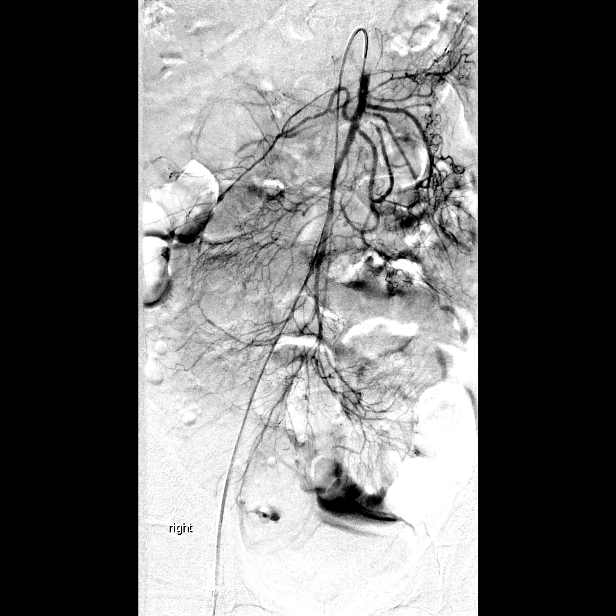
[im 119/211]
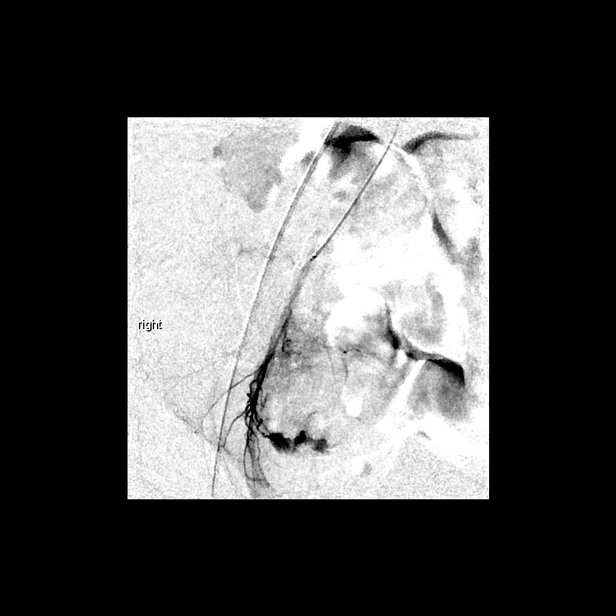
[im 137/211]
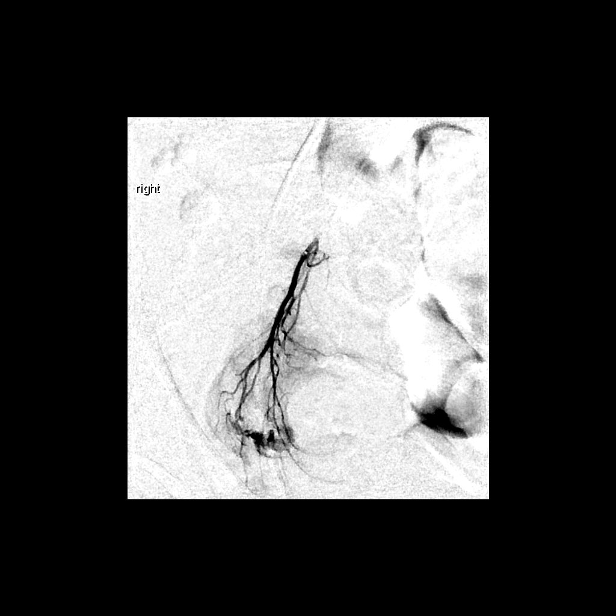
[im 156/211]
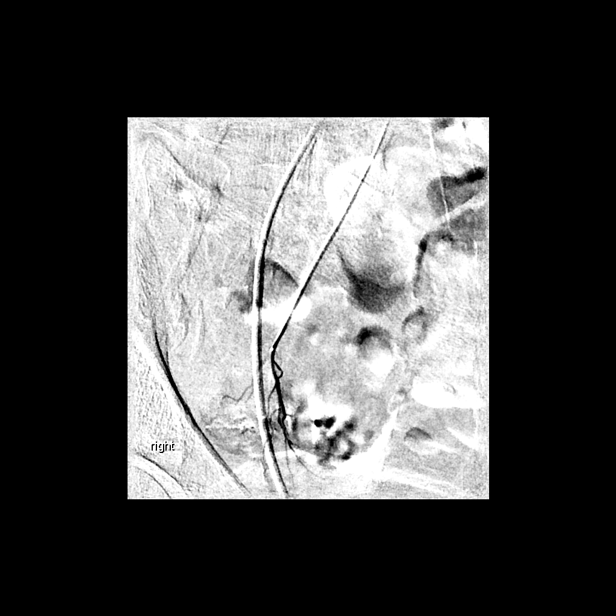
[im 174/211]
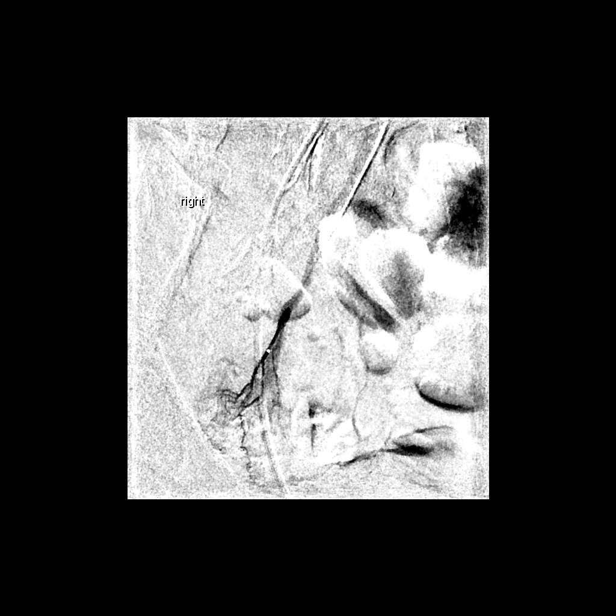
[im 192/211]
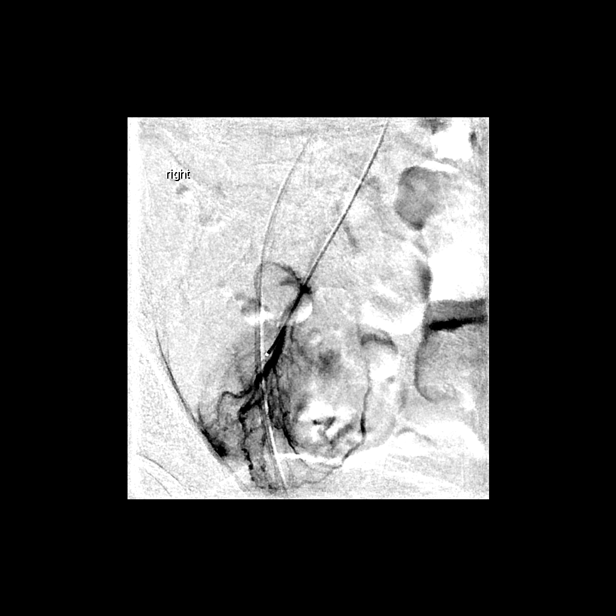
[im 211/211]
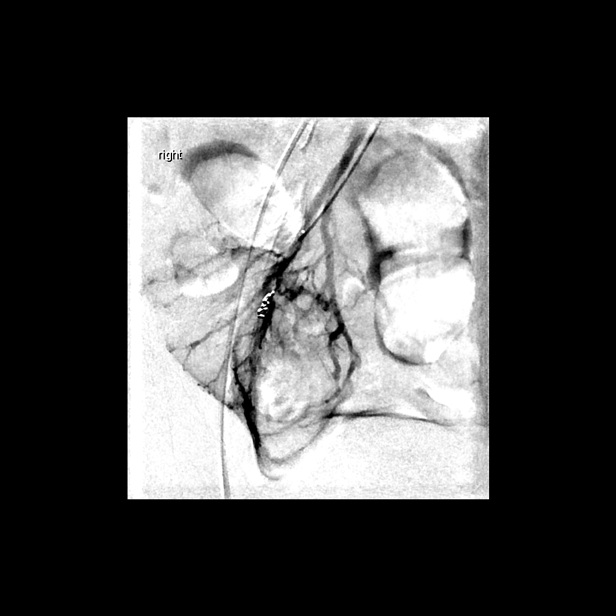

[13 of 24 positions shown; findings below may reference images not displayed]

EXAM:
SELECTIVE VISCERAL ARTERIOGRAPHY; IR CHEI HEMORR LYMPH
EXTRAV INC GUIDE ROADMAPPING; ADDITIONAL ARTERIOGRAPHY; IR
ULTRASOUND GUIDANCE VASC ACCESS RIGHT

ANESTHESIA/SEDATION:
Intravenous Fentanyl [PE] and Versed 1.5mg were administered as
conscious sedation during continuous monitoring of the patient's
level of consciousness and physiological / cardiorespiratory status
by the radiology RN, with a total moderate sedation time of of 45
minutes.

MEDICATIONS:
Lidocaine 1% subcutaneous

CONTRAST:  50mL OMNIPAQUE IOHEXOL 300 MG/ML SOLN, 16mL OMNIPAQUE
IOHEXOL 300 MG/ML SOLN

PROCEDURE:
The procedure, risks (including but not limited to bleeding,
infection, organ damage ), benefits, and alternatives were explained
to the patient. Questions regarding the procedure were encouraged
and answered. The patient understands and consents to the procedure.

Right femoral region prepped and draped in usual sterile fashion.
Maximal barrier sterile technique was utilized including caps, mask,
sterile gowns, sterile gloves, sterile drape, hand hygiene and skin
antiseptic.

The right common femoral artery was localized under ultrasound.
Under real-time ultrasound guidance, the vessel was accessed with a
21-gauge micropuncture needle, exchanged over a 018 guidewire for a
transitional dilator, through which a 035 guidewire was advanced.
Over this, a 5 French vascular sheath was placed, through which a 5
French C2 catheter was advanced and used to selectively catheterize
the superior mesenteric artery for selective arteriography.

Coaxial microcatheter was advanced into third order SMA branches to
localize bleeding source.

Coil embolization using a 2 mm x 4 cm interlock coil was performed.

Follow-up immediate and delayed selective arteriography in multiple
projections was performed.

The catheter and sheath were removed and hemostasis achieved with
the aid of the Exoseal device after confirmatory femoral
arteriography.

The patient tolerated the procedure well.

COMPLICATIONS:
None immediate
FINDINGS: Active extravasation in the cecum was confirmed corresponding to CT
findings. The third or supplying branch was identified and
catheterized. Confirmatory arteriography was performed. Coil
embolization of the bleeding branch was performed. No further
extravasation was evident.
IMPRESSION: 1. POSITIVE for active arterial extravasation the cecum.
2. Technically successful subselective coil embolization of
supplying third order SMA branch, with cessation of extravasation.

## 2018-09-14 MED ORDER — SODIUM CHLORIDE 0.9% IV SOLUTION: Freq: Once | INTRAVENOUS | Status: DC

## 2018-09-14 MED ORDER — POTASSIUM CHLORIDE 20 MEQ/15ML (10%) PO SOLN
40.0000 meq | Freq: Two times a day (BID) | ORAL | Status: AC
  Administered 2018-09-15: 40 meq
  Filled 2018-09-14: qty 30

## 2018-09-14 MED ORDER — HYDROCODONE-ACETAMINOPHEN 5-325 MG PO TABS
1.0000 | ORAL_TABLET | ORAL | Status: DC | PRN
  Administered 2018-09-15 – 2018-09-16 (×2): 2 via ORAL
  Filled 2018-09-14 (×2): qty 2

## 2018-09-14 MED ORDER — MIDAZOLAM HCL 2 MG/2ML IJ SOLN
INTRAMUSCULAR | Status: AC | PRN
  Administered 2018-09-14: 0.5 mg via INTRAVENOUS
  Administered 2018-09-14: 1 mg via INTRAVENOUS

## 2018-09-14 MED ORDER — MACITENTAN 10 MG PO TABS
10.0000 mg | ORAL_TABLET | Freq: Every day | ORAL | Status: DC
  Administered 2018-09-15 – 2018-09-17 (×3): 10 mg via ORAL
  Filled 2018-09-14 (×3): qty 1

## 2018-09-14 MED ORDER — IOHEXOL 300 MG/ML  SOLN
100.0000 mL | Freq: Once | INTRAMUSCULAR | Status: AC | PRN
  Administered 2018-09-14: 50 mL via INTRAVENOUS

## 2018-09-14 MED ORDER — IOHEXOL 350 MG/ML SOLN
80.0000 mL | Freq: Once | INTRAVENOUS | Status: AC | PRN
  Administered 2018-09-14: 80 mL via INTRAVENOUS

## 2018-09-14 MED ORDER — LIDOCAINE HCL 1 % IJ SOLN
INTRAMUSCULAR | Status: AC
  Filled 2018-09-14: qty 20

## 2018-09-14 MED ORDER — FENTANYL CITRATE (PF) 100 MCG/2ML IJ SOLN
INTRAMUSCULAR | Status: AC
  Filled 2018-09-14: qty 4

## 2018-09-14 MED ORDER — MACITENTAN 10 MG PO TABS: 10.0000 mg | ORAL_TABLET | Freq: Every day | ORAL | Status: DC

## 2018-09-14 MED ORDER — SODIUM CHLORIDE 0.9 % IV SOLN
8.0000 mg/h | INTRAVENOUS | Status: DC
  Administered 2018-09-14 – 2018-09-16 (×4): 8 mg/h via INTRAVENOUS
  Filled 2018-09-14 (×4): qty 80

## 2018-09-14 MED ORDER — SODIUM CHLORIDE 0.9 % IV SOLN
INTRAVENOUS | Status: AC
  Administered 2018-09-14 – 2018-09-15 (×2): via INTRAVENOUS

## 2018-09-14 MED ORDER — SILDENAFIL CITRATE 20 MG PO TABS: 20.0000 mg | ORAL_TABLET | Freq: Three times a day (TID) | ORAL | Status: DC

## 2018-09-14 MED ORDER — IOHEXOL 300 MG/ML  SOLN
150.0000 mL | Freq: Once | INTRAMUSCULAR | Status: AC | PRN
  Administered 2018-09-14: 16 mL via INTRAVENOUS

## 2018-09-14 MED ORDER — SILDENAFIL CITRATE 20 MG PO TABS
20.0000 mg | ORAL_TABLET | Freq: Three times a day (TID) | ORAL | Status: DC
  Administered 2018-09-15 – 2018-09-17 (×7): 20 mg via ORAL
  Filled 2018-09-14 (×11): qty 1

## 2018-09-14 MED ORDER — FENTANYL CITRATE (PF) 100 MCG/2ML IJ SOLN
INTRAMUSCULAR | Status: AC | PRN
  Administered 2018-09-14 (×2): 25 ug via INTRAVENOUS

## 2018-09-14 MED ORDER — PANTOPRAZOLE SODIUM 40 MG IV SOLR
40.0000 mg | Freq: Once | INTRAVENOUS | Status: AC
  Administered 2018-09-14: 18:00:00 40 mg via INTRAVENOUS
  Filled 2018-09-14: qty 40

## 2018-09-14 MED ORDER — ONDANSETRON HCL 4 MG/2ML IJ SOLN: 4.0000 mg | Freq: Four times a day (QID) | INTRAMUSCULAR | Status: DC | PRN

## 2018-09-14 MED ORDER — MIDAZOLAM HCL 2 MG/2ML IJ SOLN
INTRAMUSCULAR | Status: AC
  Filled 2018-09-14: qty 6

## 2018-09-14 NOTE — Sedation Documentation (Addendum)
PRBC Unit [id number] A POSITIVE was hung, spikes and ready to transfuse. Checked with Denyse Dago, RN.Transfusion started.

## 2018-09-14 NOTE — ED Provider Notes (Signed)
Patient is a 58 year old male who presents with bright red blood per rectum.  He has no abdominal pain.  No dizziness or fatigue.  On arrival his vital signs are stable.  He was recently admitted with a GI bleed and had multiple bleeding AVMs.  During his stay, Dr. Benson Norway with gastroenterology has seen the patient.  He was started to become hypotensive and had a couple more episodes of bright red blood.  Given this, Dr. Benson Norway recommend getting a CTA and consulting interventional radiology.  Patient became more hypotensive and was started on emergency release blood.  We are unable to get the CTA given his elevated creatinine.  I spoke with Dr. Vernard Gambles with interventional radiologist who initially felt that they could go ahead and bring the team into IR.  However on review of the patient's records, he had multiple AVMs throughout the small bowel and colon and Dr. Vernard Gambles felt that it would be beneficial to get a CTA with a reduced dose of contrast to better localize the bleeding.  On CT, active bleeding was noted and pt was taken to IR.  CCM consulted to admit.    Malvin Johns, MD 09/14/18 [phone]

## 2018-09-14 NOTE — ED Notes (Addendum)
RN called Wife and gave an update. All questions answered.

## 2018-09-14 NOTE — ED Notes (Signed)
MD at bedside

## 2018-09-14 NOTE — ED Notes (Signed)
Pt drowsy says he needs to take a nap

## 2018-09-14 NOTE — Procedures (Signed)
  Procedure: Mesenteric arteriogram, Subselective coil embolization of cecal bleed EBL:   minimal Complications:  none immediate  See full dictation in [facility].  Dillard Cannon MD Main # [phone] Pager  [phone]
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
## 2018-09-14 NOTE — Sedation Documentation (Signed)
Spoke with [place], [employer] support, requested bed from 3M11 be brought to IR1.

## 2018-09-14 NOTE — ED Triage Notes (Signed)
Pt BIB [facility]. Per EMS patient recently had a GI surgery (pt unsure what surgery it was). Pt reports it was over a week ago. Pt had a bloody bm today with obvious blood. Pt reports that he is not having any pain at present time.

## 2018-09-14 NOTE — ED Notes (Signed)
Pt feels like he needs to have another BM, small amount bright red liquid in bedpan

## 2018-09-14 NOTE — ED Notes (Signed)
IR MD at bedside

## 2018-09-14 NOTE — Progress Notes (Signed)
eLink Physician-Brief Progress Note Patient Name: AURTHUR WINGERTER DOB: 1961/01/10 MRN: [id number]   Date of Service  09/14/2018  HPI/Events of Note  57 Male , in [facility] ED. Resident called and discussed. 1. LGI bleeding, hypotension. AVM a month ago on scopy. GI on the case. Going for CTA and if active bleeding embolization. Getting PRBC. Hg 7.5, plt 124.   eICU Interventions  Get INR. Consider massive transfusion if hg drops or hypotensive.  Discussed and notified Brandi, NP.      Intervention Category Minor Interventions: Communication with other healthcare providers and/or family  Elmer Sow 09/14/2018, 8:36 PM

## 2018-09-14 NOTE — Sedation Documentation (Signed)
Pt had large bloody BM on bedpan upon arrival to IR.

## 2018-09-14 NOTE — ED Notes (Signed)
Delay in lab draw,  Pt not in room.
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
## 2018-09-14 NOTE — Sedation Documentation (Addendum)
Waterman, RN, ED, and requested that she get order for 3rd unit of PRBC.
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
## 2018-09-14 NOTE — Sedation Documentation (Addendum)
5 Fr sheath removed from R femoral artery by Dr. Vernard Gambles. Hemostasis achieve using exoseal closure device. Groin level 0. RPT  + Doppler.

## 2018-09-14 NOTE — ED Notes (Signed)
MD plan for pt to go to IR. IR on call on the way. IR to fix arterial bleed.

## 2018-09-14 NOTE — ED Notes (Signed)
RN offered to call and update family. Pt declined. Pt stated he would call her.

## 2018-09-15 ENCOUNTER — Encounter (HOSPITAL_COMMUNITY)
Admission: EM | Disposition: A | Discharge status: Home / Self Care | Payer: Self-pay | Source: Home / Self Care | Attending: Internal Medicine

## 2018-09-15 ENCOUNTER — Inpatient Hospital Stay (HOSPITAL_COMMUNITY): Payer: 59

## 2018-09-15 ENCOUNTER — Encounter (HOSPITAL_COMMUNITY): Payer: Self-pay | Admitting: Interventional Radiology

## 2018-09-15 DIAGNOSIS — N181 Chronic kidney disease, stage 1: Secondary | ICD-10-CM

## 2018-09-15 DIAGNOSIS — N170 Acute kidney failure with tubular necrosis: Secondary | ICD-10-CM

## 2018-09-15 DIAGNOSIS — D62 Acute posthemorrhagic anemia: Secondary | ICD-10-CM

## 2018-09-15 DIAGNOSIS — I272 Pulmonary hypertension, unspecified: Secondary | ICD-10-CM

## 2018-09-15 DIAGNOSIS — M349 Systemic sclerosis, unspecified: Secondary | ICD-10-CM

## 2018-09-15 LAB — CBC
HCT: 23.6 % — ABNORMAL LOW (ref 39.0–52.0)
HCT: 20.1 % — ABNORMAL LOW (ref 39.0–52.0)
Hemoglobin: 8.3 g/dL — ABNORMAL LOW (ref 13.0–17.0)
Hemoglobin: 6.7 g/dL — CL (ref 13.0–17.0)
MCH: 29.9 pg (ref 26.0–34.0)
MCH: 28.8 pg (ref 26.0–34.0)
MCHC: 35.2 g/dL (ref 30.0–36.0)
MCHC: 33.3 g/dL (ref 30.0–36.0)
MCV: 84.9 fL (ref 80.0–100.0)
MCV: 86.3 fL (ref 80.0–100.0)
Platelets: 74 10*3/uL — ABNORMAL LOW (ref 150–400)
Platelets: 75 10*3/uL — ABNORMAL LOW (ref 150–400)
RBC: 2.78 MIL/uL — ABNORMAL LOW (ref 4.22–5.81)
RBC: 2.33 MIL/uL — ABNORMAL LOW (ref 4.22–5.81)
RDW: 14.3 % (ref 11.5–15.5)
RDW: 15.2 % (ref 11.5–15.5)
WBC: 4.6 10*3/uL (ref 4.0–10.5)
WBC: 5.5 10*3/uL (ref 4.0–10.5)
nRBC: 0 % (ref 0.0–0.2)
nRBC: 0 % (ref 0.0–0.2)

## 2018-09-15 LAB — BASIC METABOLIC PANEL
Anion gap: 11 (ref 5–15)
Anion gap: 9 (ref 5–15)
BUN: 59 mg/dL — ABNORMAL HIGH (ref 6–20)
BUN: 62 mg/dL — ABNORMAL HIGH (ref 6–20)
CO2: 20 mmol/L — ABNORMAL LOW (ref 22–32)
CO2: 16 mmol/L — ABNORMAL LOW (ref 22–32)
Calcium: 6.4 mg/dL — CL (ref 8.9–10.3)
Calcium: 6.9 mg/dL — ABNORMAL LOW (ref 8.9–10.3)
Chloride: 111 mmol/L (ref 98–111)
Chloride: 114 mmol/L — ABNORMAL HIGH (ref 98–111)
Creatinine, Ser: 2.3 mg/dL — ABNORMAL HIGH (ref 0.61–1.24)
Creatinine, Ser: 2.34 mg/dL — ABNORMAL HIGH (ref 0.61–1.24)
GFR calc Af Amer: 35 mL/min — ABNORMAL LOW (ref >60)
GFR calc Af Amer: 34 mL/min — ABNORMAL LOW (ref >60)
GFR calc non Af Amer: 30 mL/min — ABNORMAL LOW (ref >60)
GFR calc non Af Amer: 29 mL/min — ABNORMAL LOW (ref >60)
Glucose, Bld: 106 mg/dL — ABNORMAL HIGH (ref 70–99)
Glucose, Bld: 92 mg/dL (ref 70–99)
Potassium: 3.3 mmol/L — ABNORMAL LOW (ref 3.5–5.1)
Potassium: 3.7 mmol/L (ref 3.5–5.1)
Sodium: 142 mmol/L (ref 135–145)
Sodium: 139 mmol/L (ref 135–145)

## 2018-09-15 LAB — COMPREHENSIVE METABOLIC PANEL
ALT: 9 U/L (ref 0–44)
AST: 11 U/L — ABNORMAL LOW (ref 15–41)
Albumin: 2.1 g/dL — ABNORMAL LOW (ref 3.5–5.0)
Alkaline Phosphatase: 46 U/L (ref 38–126)
Anion gap: 8 (ref 5–15)
BUN: 60 mg/dL — ABNORMAL HIGH (ref 6–20)
CO2: 19 mmol/L — ABNORMAL LOW (ref 22–32)
Calcium: 6.5 mg/dL — ABNORMAL LOW (ref 8.9–10.3)
Chloride: 115 mmol/L — ABNORMAL HIGH (ref 98–111)
Creatinine, Ser: 2.25 mg/dL — ABNORMAL HIGH (ref 0.61–1.24)
GFR calc Af Amer: 36 mL/min — ABNORMAL LOW (ref >60)
GFR calc non Af Amer: 31 mL/min — ABNORMAL LOW (ref >60)
Glucose, Bld: 79 mg/dL (ref 70–99)
Potassium: 3.1 mmol/L — ABNORMAL LOW (ref 3.5–5.1)
Sodium: 142 mmol/L (ref 135–145)
Total Bilirubin: 0.7 mg/dL (ref 0.3–1.2)
Total Protein: 4.3 g/dL — ABNORMAL LOW (ref 6.5–8.1)

## 2018-09-15 LAB — MRSA PCR SCREENING: MRSA by PCR: NEGATIVE

## 2018-09-15 LAB — PREPARE RBC (CROSSMATCH)

## 2018-09-15 LAB — MAGNESIUM
Magnesium: 0.9 mg/dL — CL (ref 1.7–2.4)
Magnesium: 1.6 mg/dL — ABNORMAL LOW (ref 1.7–2.4)

## 2018-09-15 LAB — GLUCOSE, CAPILLARY: Glucose-Capillary: 60 mg/dL — ABNORMAL LOW (ref 70–99)

## 2018-09-15 LAB — PHOSPHORUS: Phosphorus: 4.2 mg/dL (ref 2.5–4.6)

## 2018-09-15 IMAGING — DX PORTABLE CHEST - 1 VIEW
1 series · 1 of 1 positions shown · non-contrast
Comparison: Chest CT [DATE].

CLINICAL DATA: 57-year-old male with history of congestive heart
failure.

EXAM:
PORTABLE CHEST 1 VIEW

[chest]
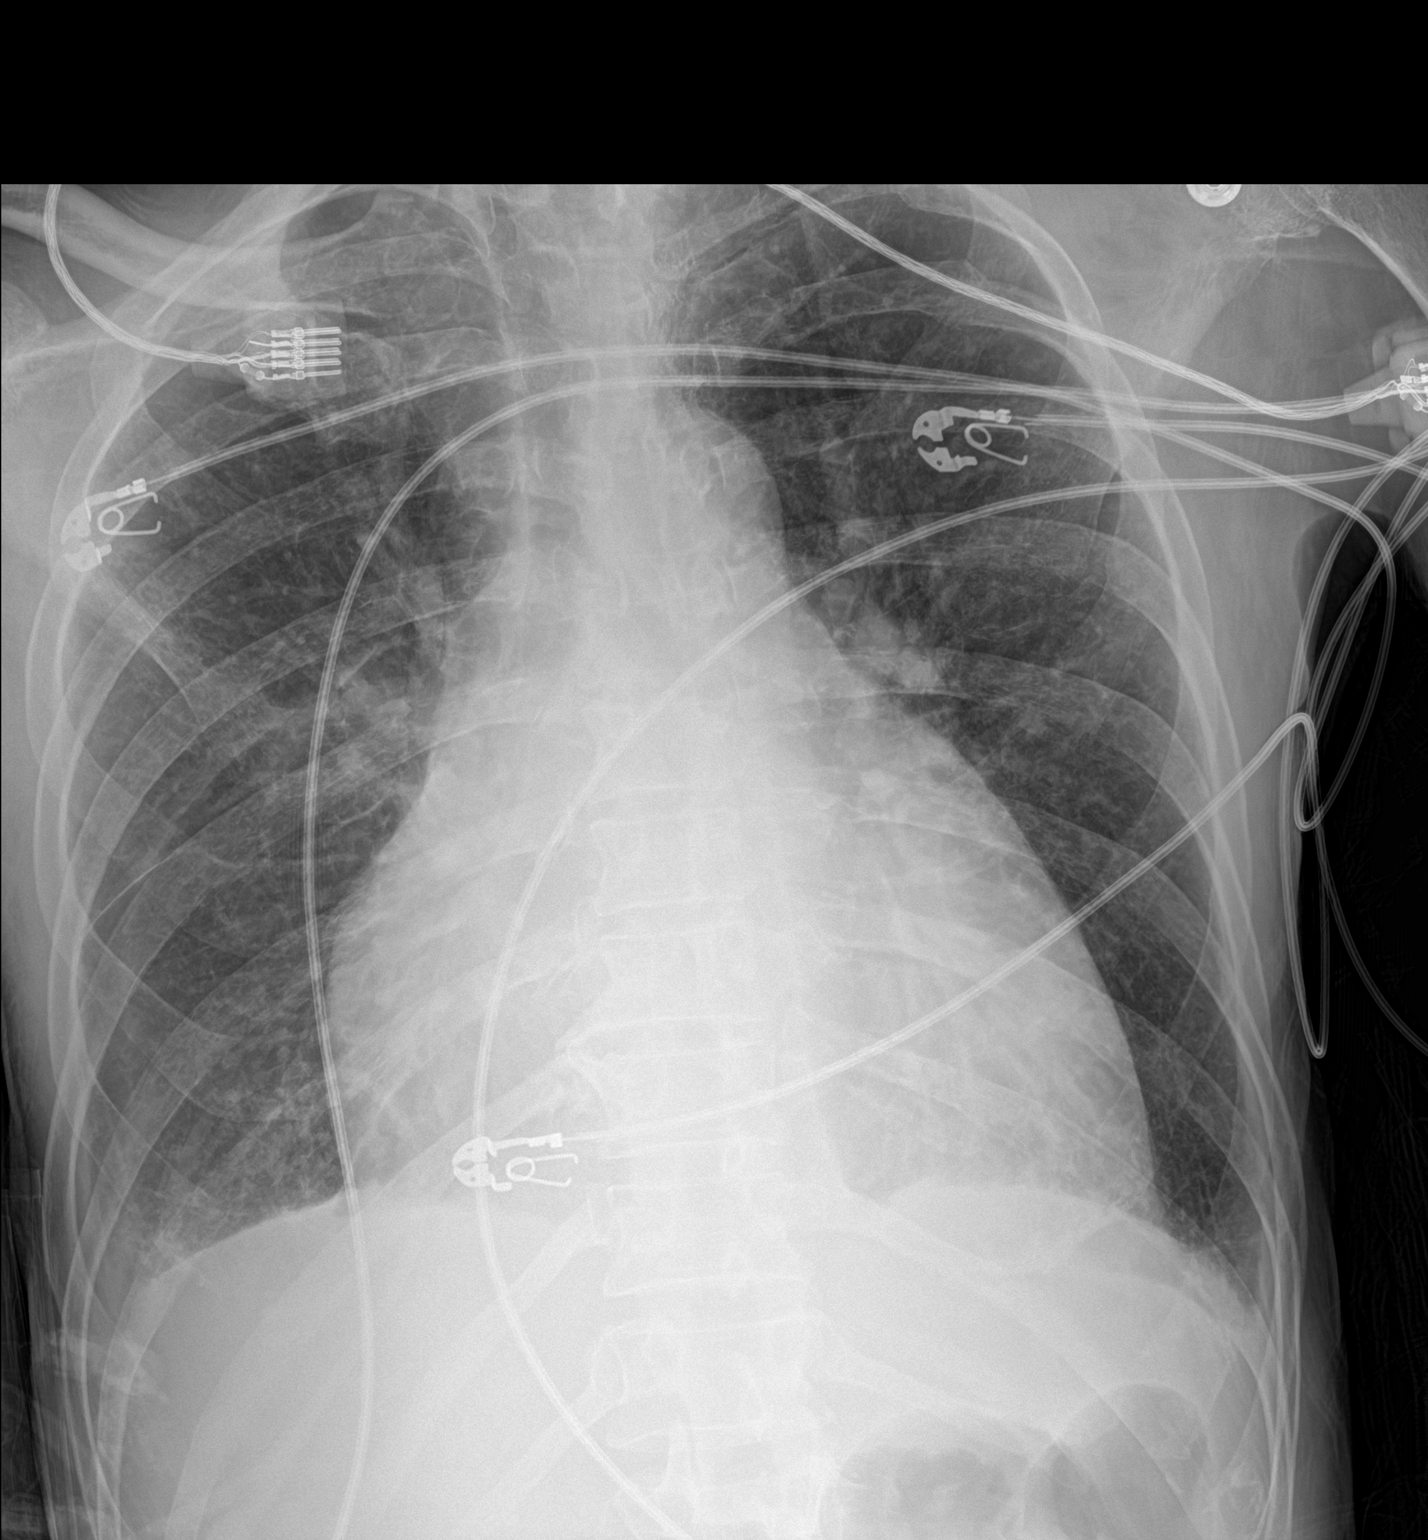

[1 of 1 positions shown; findings below may reference images not displayed]

FINDINGS: Lung volumes are normal. No consolidative airspace disease. Small
right and trace left pleural effusions. Cephalization of the
pulmonary vasculature, without frank pulmonary edema. Heart size is
mild to moderately enlarged. Upper mediastinal contours are within
normal limits.
IMPRESSION: 1. Cardiomegaly with pulmonary venous congestion but no frank
pulmonary edema.
2. Small right and trace left pleural effusions, decreased in size
compared to the prior study.

## 2018-09-15 SURGERY — COLONOSCOPY WITH PROPOFOL
Anesthesia: Monitor Anesthesia Care

## 2018-09-15 MED ORDER — MAGNESIUM SULFATE 2 GM/50ML IV SOLN
2.0000 g | Freq: Once | INTRAVENOUS | Status: AC
  Administered 2018-09-15: 2 g via INTRAVENOUS
  Filled 2018-09-15: qty 50

## 2018-09-15 MED ORDER — MAGNESIUM SULFATE IN D5W 1-5 GM/100ML-% IV SOLN
1.0000 g | Freq: Once | INTRAVENOUS | Status: AC
  Administered 2018-09-15: 1 g via INTRAVENOUS
  Filled 2018-09-15: qty 100

## 2018-09-15 MED ORDER — ENSURE ENLIVE PO LIQD
237.0000 mL | Freq: Two times a day (BID) | ORAL | Status: DC
  Administered 2018-09-16 (×2): 237 mL via ORAL

## 2018-09-15 MED ORDER — SODIUM CHLORIDE 0.9% IV SOLUTION
Freq: Once | INTRAVENOUS | Status: AC
  Administered 2018-09-15: 19:00:00 via INTRAVENOUS

## 2018-09-15 MED ORDER — CALCIUM GLUCONATE-NACL 1-0.675 GM/50ML-% IV SOLN
1.0000 g | Freq: Once | INTRAVENOUS | Status: AC
  Administered 2018-09-15: 1000 mg via INTRAVENOUS
  Filled 2018-09-15: qty 50

## 2018-09-15 MED ORDER — DEXTROSE 50 % IV SOLN
25.0000 mL | Freq: Once | INTRAVENOUS | Status: AC
  Administered 2018-09-15: 25 mL via INTRAVENOUS
  Filled 2018-09-15: qty 50

## 2018-09-15 MED ORDER — CHLORHEXIDINE GLUCONATE CLOTH 2 % EX PADS
6.0000 | MEDICATED_PAD | Freq: Every day | CUTANEOUS | Status: DC
  Administered 2018-09-15 – 2018-09-17 (×3): 6 via TOPICAL

## 2018-09-15 NOTE — Progress Notes (Signed)
Referring Physician(s): Carol Ada  Supervising Physician: Corrie Mckusick  Patient Status:  [facility] - In-pt  Chief Complaint: None  Subjective:  Cecal bleed s/p coil embolization of supplying third order SMA branch 09/14/2018 by Dr. Vernard Gambles. Patient awake and alert sitting in bed with no complaints at this time. States that he is "feeling great" today.   Allergies: Patient has no known allergies.  Medications: Prior to Admission medications   Medication Sig Start Date End Date Taking? Authorizing Provider  allopurinol (ZYLOPRIM) 100 MG tablet TAKE 1 TABLET BY MOUTH EVERY DAY Patient taking differently: Take 100 mg by mouth daily.  07/08/18  Yes Deveshwar, Abel Presto, MD  ALPRAZolam Duanne Moron) 0.5 MG tablet Take 0.5 mg by mouth daily as needed for anxiety.  07/31/12  Yes [provider]  colchicine 0.6 MG tablet Take 1 tablet (0.6 mg total) by mouth daily as needed. Patient taking differently: Take 0.6 mg by mouth daily as needed (gout flare).  03/23/17  Yes Deveshwar, Abel Presto, MD  Cyanocobalamin (B-12) 2500 MCG TABS Take 2,500 mcg by mouth daily.   Yes [provider]  fluticasone (FLONASE) 50 MCG/ACT nasal spray Place 2 sprays into both nostrils daily as needed for allergies.  03/24/14  Yes [provider]  HYDROcodone-acetaminophen (NORCO) 10-325 MG tablet Take 0.5 tablets by mouth 2 (two) times daily as needed for moderate pain.    Yes [provider]  lisinopril (ZESTRIL) 40 MG tablet Take 40 mg by mouth daily. Daily at night   Yes [provider]  macitentan (OPSUMIT) 10 MG tablet Take 1 tablet (10 mg total) by mouth daily. 07/10/18  Yes Bensimhon, Shaune Pascal, MD  nitroGLYCERIN (NITROSTAT) 0.4 MG SL tablet Place 1 tablet (0.4 mg total) under the tongue every 5 (five) minutes as needed. Patient taking differently: Place 0.4 mg under the tongue every 5 (five) minutes as needed for chest pain.  01/15/18  Yes Duke, Tami Lin, PA  pantoprazole  (PROTONIX) 40 MG tablet Take 1 tablet (40 mg total) by mouth 2 (two) times daily. 08/31/18  Yes Regalado, Belkys A, MD  sildenafil (REVATIO) 20 MG tablet Take 1 tablet (20 mg total) by mouth 3 (three) times daily. 07/01/18  Yes Bensimhon, Shaune Pascal, MD  simvastatin (ZOCOR) 40 MG tablet TAKE 1 TABLET BY MOUTH DAILY AT 6PM Patient taking differently: Take 40 mg by mouth daily. Daily in the morning 07/05/18  Yes Lorretta Harp, MD  torsemide (DEMADEX) 20 MG tablet Take 1 tablet (20 mg total) by mouth daily. 09/01/18  Yes Regalado, Belkys A, MD     Vital Signs: BP 105/66 (BP Location: Right Arm)    Pulse 75    Temp 98.4 F (36.9 C) (Oral)    Resp 14    Ht 5' 8.5" (1.74 m)    Wt 111 lb 15.9 oz (50.8 kg)    SpO2 100%    BMI 16.78 kg/m   Physical Exam Vitals signs and nursing note reviewed.  Constitutional:      General: He is not in acute distress.    Appearance: Normal appearance.  Pulmonary:     Effort: Pulmonary effort is normal. No respiratory distress.  Skin:    General: Skin is warm and dry.     Comments: Right groin incision soft without active bleeding or hematoma.  Neurological:     Mental Status: He is alert and oriented to person, place, and time.  Psychiatric:        Mood and Affect:  Mood normal.        Behavior: Behavior normal.        Thought Content: Thought content normal.        Judgment: Judgment normal.     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 The patient had a colonoscopy & EGD on 5/8 with findings of bleeding proximal colon AVM's, proximal small bowel AVM's. His Hgb was ~ 9 at time of discharge. Before that admit, his Hgb was 4.6. Chronic renal insufficiency. Low-dose CTA was obtained for better localization, demonstrating active extravasation in the cecum. We are asked to see the patient for  mesenteric arteriography and possible embolization. EXAM: SELECTIVE VISCERAL ARTERIOGRAPHY; IR EMBO ART VEN HEMORR LYMPH EXTRAV INC GUIDE ROADMAPPING; ADDITIONAL ARTERIOGRAPHY; IR ULTRASOUND GUIDANCE VASC ACCESS RIGHT ANESTHESIA/SEDATION: Intravenous Fentanyl 76mg and Versed 1.522mwere administered as conscious sedation during continuous monitoring of the patient's level of consciousness and physiological / cardiorespiratory status by the radiology RN, with a total moderate sedation time of of 45 minutes. MEDICATIONS: Lidocaine 1% subcutaneous CONTRAST:  5026mMNIPAQUE IOHEXOL 300 MG/ML SOLN, 63m76mNIPAQUE IOHEXOL 300 MG/ML SOLN PROCEDURE: The procedure, risks (including but not limited to bleeding, infection, organ damage ), benefits, and alternatives were explained to the patient. Questions regarding the procedure were encouraged and answered. The patient understands and consents to the procedure. Right femoral region prepped and draped in usual sterile fashion. Maximal barrier sterile technique was utilized including caps, mask, sterile gowns, sterile gloves, sterile drape, hand hygiene and skin antiseptic. The right common femoral artery was localized under ultrasound. Under real-time ultrasound guidance, the vessel was accessed with a 21-gauge micropuncture needle, exchanged over a 018 guidewire for a transitional dilator, through which a 035 guidewire was advanced. Over this, a 5 FrenPakistancular sheath was placed, through which a 5 FrenPakistancatheter was advanced and used to selectively catheterize the superior mesenteric artery for selective arteriography. Coaxial microcatheter was advanced into third order SMA branches to localize bleeding source. Coil embolization using a 2 mm x 4 cm interlock coil was performed. Follow-up immediate and delayed selective arteriography in multiple projections was performed. The catheter and sheath were removed and hemostasis achieved with the aid of the Exoseal device  after confirmatory femoral arteriography. The patient tolerated the procedure well. COMPLICATIONS: None immediate FINDINGS: Active extravasation in the cecum was confirmed corresponding to CT findings. The third or supplying branch was identified and catheterized. Confirmatory arteriography was performed. Coil embolization of the bleeding branch was performed. No further extravasation was evident. IMPRESSION: 1. POSITIVE for active arterial extravasation the cecum. 2. Technically successful subselective coil embolization of supplying third order SMA branch, with cessation of extravasation. Electronically Signed   By: D  HLucrezia [place].   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 The patient had a colonoscopy & EGD on 5/8 with findings of bleeding proximal colon AVM's, proximal small bowel AVM's. His Hgb was ~ 9 at time of discharge. Before that admit, his Hgb was 4.6. Chronic renal insufficiency. Low-dose CTA was obtained for better localization, demonstrating active extravasation in the cecum. We are asked to see the patient for mesenteric arteriography and possible embolization. EXAM: SELECTIVE VISCERAL ARTERIOGRAPHY;  IR EMBO ART VEN HEMORR LYMPH EXTRAV INC GUIDE ROADMAPPING; ADDITIONAL ARTERIOGRAPHY; IR ULTRASOUND GUIDANCE VASC ACCESS RIGHT ANESTHESIA/SEDATION: Intravenous Fentanyl 26mg and Versed 1.532mwere administered as conscious sedation during continuous monitoring of the patient's level of consciousness and physiological / cardiorespiratory status by the radiology RN, with a total moderate sedation time of of 45 minutes. MEDICATIONS: Lidocaine 1% subcutaneous CONTRAST:  5033mMNIPAQUE IOHEXOL 300 MG/ML SOLN, 67m47mNIPAQUE IOHEXOL 300 MG/ML SOLN PROCEDURE: The procedure, risks (including but not limited to  bleeding, infection, organ damage ), benefits, and alternatives were explained to the patient. Questions regarding the procedure were encouraged and answered. The patient understands and consents to the procedure. Right femoral region prepped and draped in usual sterile fashion. Maximal barrier sterile technique was utilized including caps, mask, sterile gowns, sterile gloves, sterile drape, hand hygiene and skin antiseptic. The right common femoral artery was localized under ultrasound. Under real-time ultrasound guidance, the vessel was accessed with a 21-gauge micropuncture needle, exchanged over a 018 guidewire for a transitional dilator, through which a 035 guidewire was advanced. Over this, a 5 FrenPakistancular sheath was placed, through which a 5 FrenPakistancatheter was advanced and used to selectively catheterize the superior mesenteric artery for selective arteriography. Coaxial microcatheter was advanced into third order SMA branches to localize bleeding source. Coil embolization using a 2 mm x 4 cm interlock coil was performed. Follow-up immediate and delayed selective arteriography in multiple projections was performed. The catheter and sheath were removed and hemostasis achieved with the aid of the Exoseal device after confirmatory femoral arteriography. The patient tolerated the procedure well. COMPLICATIONS: None immediate FINDINGS: Active extravasation in the cecum was confirmed corresponding to CT findings. The third or supplying branch was identified and catheterized. Confirmatory arteriography was performed. Coil embolization of the bleeding branch was performed. No further extravasation was evident. IMPRESSION: 1. POSITIVE for active arterial extravasation the cecum. 2. Technically successful subselective coil embolization of supplying third order SMA branch, with cessation of extravasation. Electronically Signed   By: D  HLucrezia [place].   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 The patient had a colonoscopy & EGD on 5/8 with findings of bleeding proximal colon AVM's, proximal small bowel AVM's. His Hgb was ~ 9 at time of discharge. Before that admit, his Hgb was 4.6. Chronic renal insufficiency. Low-dose CTA was obtained for better localization, demonstrating active extravasation in the cecum. We are asked to see the patient for mesenteric arteriography and possible embolization. EXAM: SELECTIVE VISCERAL ARTERIOGRAPHY; IR EMBO ART VEN HEMORR LYMPH EXTRAV INC GUIDE ROADMAPPING; ADDITIONAL ARTERIOGRAPHY; IR ULTRASOUND GUIDANCE VASC ACCESS RIGHT ANESTHESIA/SEDATION: Intravenous Fentanyl 75mc75md Versed 1.5mg w37m administered as conscious sedation during continuous monitoring of the patient's level of consciousness and physiological / cardiorespiratory status by the radiology RN, with a total moderate sedation time of of 45 minutes. MEDICATIONS: Lidocaine 1% subcutaneous CONTRAST:  50mL O13mAQUE IOHEXOL 300 MG/ML SOLN, 67mL OM82mQUE IOHEXOL 300 MG/ML SOLN PROCEDURE: The procedure, risks (including but not limited to bleeding, infection, organ damage ), benefits, and alternatives were explained to the patient. Questions regarding the procedure were encouraged and answered. The patient understands and consents to the procedure. Right femoral region prepped and draped in usual sterile fashion. Maximal barrier sterile technique was utilized including caps, mask, sterile gowns, sterile gloves, sterile drape,  hand hygiene and skin antiseptic. The right common femoral artery was localized under ultrasound. Under real-time ultrasound guidance, the vessel was accessed with a 21-gauge micropuncture needle, exchanged over a 018 guidewire for a transitional dilator, through which a 035 guidewire was advanced. Over this, a 5  [place] vascular sheath was placed, through which a 5 [place] C2 catheter was advanced and used to selectively catheterize the superior mesenteric artery for selective arteriography. Coaxial microcatheter was advanced into third order SMA branches to localize bleeding source. Coil embolization using a 2 mm x 4 cm interlock coil was performed. Follow-up immediate and delayed selective arteriography in multiple projections was performed. The catheter and sheath were removed and hemostasis achieved with the aid of the Exoseal device after confirmatory femoral arteriography. The patient tolerated the procedure well. COMPLICATIONS: None immediate FINDINGS: Active extravasation in the cecum was confirmed corresponding to CT findings. The third or supplying branch was identified and catheterized. Confirmatory arteriography was performed. Coil embolization of the bleeding branch was performed. No further extravasation was evident. IMPRESSION: 1. POSITIVE for active arterial extravasation the cecum. 2. Technically successful subselective coil embolization of supplying third order SMA branch, with cessation of extravasation. Electronically Signed   By: Lucrezia Europe M.D.   On: 09/15/2018 12:13   Dg Chest Port 1 View  Result Date: 09/15/2018 CLINICAL DATA:  59 year old male with history of congestive heart failure. EXAM: PORTABLE CHEST 1 VIEW COMPARISON:  Chest CT 06/11/2018. FINDINGS: Lung volumes are normal. No consolidative airspace disease. Small right and trace left pleural effusions. Cephalization of the pulmonary vasculature, without frank pulmonary edema. Heart size is mild to moderately enlarged. Upper mediastinal contours are within normal limits. IMPRESSION: 1. Cardiomegaly with pulmonary venous congestion but no frank pulmonary edema. 2. Small right and trace left pleural effusions, decreased in size compared to the prior study. Electronically Signed   By: Vinnie Langton M.D.   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 The patient had a colonoscopy & EGD on 5/8 with findings of bleeding proximal colon AVM's, proximal small bowel AVM's. His Hgb was ~ 9 at time of discharge. Before that admit, his Hgb was 4.6. Chronic renal insufficiency. Low-dose CTA was obtained for better localization, demonstrating active extravasation in the cecum. We are asked to see the patient for mesenteric arteriography and possible embolization. EXAM: SELECTIVE VISCERAL ARTERIOGRAPHY; IR EMBO ART VEN HEMORR LYMPH EXTRAV INC GUIDE ROADMAPPING; ADDITIONAL ARTERIOGRAPHY; IR ULTRASOUND GUIDANCE VASC ACCESS RIGHT ANESTHESIA/SEDATION: Intravenous Fentanyl 67mg and Versed 1.539mwere administered as conscious sedation during continuous monitoring of the patient's level of consciousness and physiological / cardiorespiratory status by the radiology RN, with a total moderate sedation time of of 45 minutes. MEDICATIONS: Lidocaine 1% subcutaneous CONTRAST:  5027mMNIPAQUE IOHEXOL 300 MG/ML SOLN, 33m2mNIPAQUE IOHEXOL 300 MG/ML SOLN PROCEDURE: The procedure, risks (including but not limited to bleeding, infection, organ damage ), benefits, and alternatives were explained to the patient. Questions regarding the procedure were encouraged and answered. The patient understands and consents to the procedure. Right femoral region prepped and draped in usual sterile fashion. Maximal barrier sterile technique was utilized including caps, mask, sterile gowns, sterile gloves, sterile drape, hand hygiene and skin antiseptic. The right common femoral artery was localized under ultrasound. Under real-time ultrasound guidance, the vessel  was accessed with a 21-gauge micropuncture needle, exchanged over a 018 guidewire for a transitional dilator, through which a  035 guidewire was advanced. Over this, a 5 [place] vascular sheath was placed, through which a 5 [place] C2 catheter was advanced and used to selectively catheterize the superior mesenteric artery for selective arteriography. Coaxial microcatheter was advanced into third order SMA branches to localize bleeding source. Coil embolization using a 2 mm x 4 cm interlock coil was performed. Follow-up immediate and delayed selective arteriography in multiple projections was performed. The catheter and sheath were removed and hemostasis achieved with the aid of the Exoseal device after confirmatory femoral arteriography. The patient tolerated the procedure well. COMPLICATIONS: None immediate FINDINGS: Active extravasation in the cecum was confirmed corresponding to CT findings. The third or supplying branch was identified and catheterized. Confirmatory arteriography was performed. Coil embolization of the bleeding branch was performed. No further extravasation was evident. IMPRESSION: 1. POSITIVE for active arterial extravasation the cecum. 2. Technically successful subselective coil embolization of supplying third order SMA branch, with cessation of extravasation. Electronically Signed   By: Lucrezia Europe M.D.   On: 09/15/2018 12:13   Ct Angio Abd/pel W And/or Wo Contrast  Result Date: 09/14/2018 CLINICAL DATA:  Lower GI bleed, history of AVMs EXAM: CTA ABDOMEN AND PELVIS WITHOUT AND WITH CONTRAST TECHNIQUE: Multidetector CT imaging of the abdomen and pelvis was performed using the standard protocol during bolus administration of intravenous contrast. Multiplanar reconstructed images and MIPs were obtained and reviewed to evaluate the vascular anatomy. CONTRAST:  68m OMNIPAQUE 350 COMPARISON:  04/03/2016 FINDINGS: VASCULAR Aorta: Initial precontrast images demonstrate heavy atherosclerotic calcifications of the aorta and its branches. No aneurysmal dilatation is noted. No evidence of dissection is seen on postcontrast  images. Celiac: Variant anatomy of the celiac axis is noted with what appears to be the right hepatic artery arising directly from the aorta. Left gastric appears to arise directly from the aorta as well. The splenic artery also rises directly from the aorta. Scattered atherosclerotic changes are seen. SMA: Widely patent. Renals: Single renal arteries are identified bilaterally with mild atherosclerotic change. No focal stenosis is seen. IMA: IMA is widely patent. Iliacs: Scattered atherosclerotic changes are noted in the iliac arteries although no focal stenosis is seen. No dissection is identified. Veins: No specific venous abnormality is noted. Delayed portal venous phase images show no specific venous abnormality. Review of the MIP images confirms the above findings. NON-VASCULAR Lower chest: Bilateral pleural effusions are seen right considerably greater than left. Some associated atelectatic changes are noted. Large pericardial effusion is seen. This appears stable when compare with the prior exam. Hepatobiliary: No focal liver abnormality is seen. No gallstones, gallbladder wall thickening, or biliary dilatation. Pancreas: Unremarkable. No pancreatic ductal dilatation or surrounding inflammatory changes. Spleen: Normal in size without focal abnormality. Adrenals/Urinary Tract: Adrenal glands are within normal limits. Kidneys are well visualized bilaterally. No renal calculi or obstructive changes are seen. Normal enhancement is noted of the kidneys. Stomach/Bowel: Scattered fluid is noted throughout the colon. No obstructive or inflammatory changes are seen. On the arterial phase images there is a mural focus of contrast extravasation consistent with active arterial hemorrhage. Pooling in the cecum is identified. Delayed images demonstrate further active bleeding collecting within the cecum. The appendix is not well visualized. Small bowel and stomach appear within normal limits. Lymphatic: No significant  lymphadenopathy is seen. Reproductive: Prostate is within normal limits. Other: No free fluid or focal herniation is noted. Musculoskeletal: Degenerative  changes of lumbar spine are seen. IMPRESSION: VASCULAR Atherosclerotic changes are seen. Some variant anatomy of the celiac axis is noted. No aneurysmal dilatation or dissection is noted. NON-VASCULAR Area of active arterial hemorrhage within the cecum as described above. This may represent a small AVM given the patient's clinical history. No other areas of active extravasation are seen. Delayed venous images demonstrate continued acute hemorrhage. Large pericardial effusion which appears stable from the prior study. Large right pleural effusion. Critical Value/emergent results were called by telephone at the time of interpretation on 09/14/2018 at 9:47 pm to Dr. Vernard Gambles with IR, who verbally acknowledged these results. Additionally Dr. Vincenza Hews in the ER was notified. Electronically Signed   By: Inez Catalina M.D.   On: 09/14/2018 21:51    Labs:  CBC: Recent Labs    08/31/18 0210 09/03/18 1314 09/14/18 1738 09/15/18 0706  WBC 3.7* 6.5 7.3 4.6  HGB 9.3* 9.2* 7.5* 8.3*  HCT 28.0* 27.3* 24.1* 23.6*  PLT 156 152.0 127* 74*    COAGS: Recent Labs    09/14/18 1738  INR 1.2    BMP: Recent Labs    08/31/18 0210 09/14/18 1738 09/15/18 0258 09/15/18 0706  NA 139 138 142 142  K 4.1 2.9* 3.3* 3.1*  CL 113* 108 111 115*  CO2 15* 19* 20* 19*  GLUCOSE 121* 97 106* 79  BUN 56* 60* 59* 60*  CALCIUM 8.2* 7.2* 6.4* 6.5*  CREATININE 2.21* 2.51* 2.30* 2.25*  GFRNONAA 31* 27* 30* 31*  GFRAA 36* 31* 35* 36*    LIVER FUNCTION TESTS: Recent Labs    08/29/18 0327 09/14/18 1738 09/15/18 0706  BILITOT 0.8 0.6 0.7  AST 13* 16 11*  ALT _0 ALKPHOS 71 67 46  PROT 5.8* 6.3* 4.3*  ALBUMIN 2.4* 2.8* 2.1*    Assessment and Plan:  Cecal bleed s/p coil embolization of supplying third order SMA branch 09/14/2018 by Dr. Vernard Gambles. Patient doing  well post-procedure. Hgb up 8.3 (from 7.5). Right groin incision stable. Please call IR with questions/concerns.   Electronically Signed: Earley Abide, PA-C 09/15/2018, 12:57 PM   I spent a total of 25 Minutes at the the patient's bedside AND on the patient's hospital floor or unit, greater than 50% of which was counseling/coordinating care for cecal bleed s/p embolization.

## 2018-09-15 NOTE — H&P (Deleted)
NAME:  [name], MRN:  [id number], DOB:  1960-04-25, LOS: 1 ADMISSION DATE:  09/14/2018, CONSULTATION DATE:  09/14/18 REFERRING MD:  Dr. Tamera Punt, CHIEF COMPLAINT:  GIB    Brief History   58 y/o M who presented to [facility] 5/23 with reports of acute onset BRBPR.  The patient had a colonoscopy / EGD on 5/8 with findings of bleeding proximal colon AVM's, proximal small bowel AVM's. BRBPR began afternoon of 5/23 and he reported to ER via EMS.    History of present illness   58 y/o M who presented to [facility] 5/23 with reports of acute onset BRBPR.  The patient had a colonoscopy & EGD on 5/8 with findings of bleeding proximal colon AVM's, proximal small bowel AVM's. His Hgb was ~ 9 at time of discharge.  Before that admit, his Hgb was 4.6.    The patient reports BRBPR began afternoon of 5/23 acutely.  He activated EMS as he felt is was "a lot of blood and did not stop".  He denied abdominal pain, hematemesis, fevers, chills.  Reported one episode of non-bloody small volume yellow emesis.  He reported approximate 10 lb weight loss in the last few weeks PTA.    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 The polyp was pedunculated. The polyp was removed with a hot snare. Resection and retrieval  were complete. Multiple angioectasias with bleeding were found in the sigmoid colon, in the descending colon, in the ascending colon and in the cecum. This was most extensive in the cecum, with active bleeding.      Micro Data:  COVID 5/23 >> negative   Antimicrobials:     Interim history/subjective:  Feeling much better, today, hungry and tol regular diet   Objective   Blood pressure 105/66, pulse 75, temperature 98.4 F (36.9 C), temperature source Oral, resp. rate 14, height 5' 8.5" (1.74 m), weight 50.8 kg, SpO2 100 %.        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  Pathology negative from biopsy -  s/p embolization of cecal bleep pm 5/24 > no more active bleeding  rx per GI/IR  Acute Blood Loss Anemia    Lab Results  Component Value Date   HGB 8.3 (L) 09/15/2018   HGB 7.5 (L) 09/14/2018   HGB 9.2 (L) 09/03/2018   HGB 10.5 (L) 06/11/2018   Continue serial cbc's     AKI -in setting of blood loss, hypotension/ on ACEi PTA   Hypokalemia  Lab Results  Component Value Date   CREATININE 2.25 (H) 09/15/2018   CREATININE 2.30 (H) 09/15/2018   CREATININE 2.51 (H) 09/14/2018   CREATININE 2.01 (H) 03/23/2017   CREATININE 1.70 (H) 05/26/2016   CREATININE 1.73 (H) 02/24/2016    P: Trend BMP / urinary output  Avoid nephrotoxic agents, ensure adequate renal perfusion  (so hold acei which interfere with physiologic maint of GFR in setting of low perfusion via effects on afferent RA)     Pulmonary HTN  -hx of recurrent pericardial effusion  -WHO Group I in setting of Scleroderma  -06/2018 R/LHC with moderate to severe PAH, PA 81/26 P: Continue sildenafil, opsumit   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         Best practice:  Diet: regular diet  Pain/Anxiety/Delirium protocol (if indicated): n/a VAP protocol (if indicated): n/a DVT prophylaxis: SCD's, no heparin given GIB  GI prophylaxis: PPI  Glucose control: n/a Mobility: As tolerated with assit  Code Status: Full Code  Family Communication: n/a  Disposition: ICU for now   Labs   CBC: Recent Labs  Lab 09/14/18 1738 09/15/18 0706  WBC 7.3 4.6  NEUTROABS 5.7  --   HGB 7.5* 8.3*  HCT 24.1* 23.6*  MCV 92.7 84.9  PLT 127* 74*    Basic Metabolic Panel: Recent Labs  Lab 09/14/18 1738 09/15/18 0258 09/15/18 0706  NA 138 142 142  K 2.9* 3.3* 3.1*  CL 108 111 115*  CO2 19* 20* 19*  GLUCOSE 97 106* 79  BUN 60* 59* 60*  CREATININE 2.51* 2.30* 2.25*  CALCIUM 7.2* 6.4* 6.5*  MG  --   --  0.9*  PHOS  --   --  4.2   GFR: Estimated Creatinine Clearance: 25.4 mL/min (A) (by C-G formula based on SCr of 2.25 mg/dL (H)). Recent Labs  Lab 09/14/18 1738 09/15/18 0706  WBC 7.3 4.6    Liver Function Tests: Recent Labs  Lab 09/14/18 1738 09/15/18 0706  AST 16 11*  ALT 13 9  ALKPHOS 67 46  BILITOT 0.6 0.7  PROT 6.3* 4.3*  ALBUMIN 2.8* 2.1*   No results for input(s): LIPASE, AMYLASE in the last 168 hours. No results for input(s): AMMONIA in the last  168 hours.  ABG    Component Value Date/Time   PHART 7.485 (H) 07/01/2018 1420   PCO2ART 26.2 (L) 07/01/2018 1420   PO2ART 85.0 07/01/2018 1420   HCO3 21.9 07/01/2018 1435   TCO2 23 07/01/2018 1435   ACIDBASEDEF 2.0 07/01/2018 1435   O2SAT 61.0 07/01/2018 1435     Coagulation Profile: Recent Labs  Lab 09/14/18 1738  INR 1.2    Cardiac Enzymes:  No results for input(s): CKTOTAL, CKMB, CKMBINDEX, TROPONINI in the last 168 hours.  HbA1C: No results found for: HGBA1C  CBG: Recent Labs  Lab 09/15/18 [place]*     Christinia Gully, MD [facility] [phone] After 5:30 PM or weekends, use Bee[facility] [phone]

## 2018-09-15 NOTE — Progress Notes (Signed)
NAME:  [name], MRN:  [id number], DOB:  Apr 16, 1961, LOS: 1 ADMISSION DATE:  09/14/2018, CONSULTATION DATE:  09/14/18 REFERRING MD:  Dr. Tamera Punt, CHIEF COMPLAINT:  GIB    Brief History   58 y/o M who presented to [facility] 5/23 with reports of acute onset BRBPR.  The patient had a colonoscopy / EGD on 5/8 with findings of bleeding proximal colon AVM's, proximal small bowel AVM's. BRBPR began afternoon of 5/23 and he reported to ER via EMS.    History of present illness   58 y/o M who presented to [facility] 5/23 with reports of acute onset BRBPR.  The patient had a colonoscopy & EGD on 5/8 with findings of bleeding proximal colon AVM's, proximal small bowel AVM's. His Hgb was ~ 9 at time of discharge.  Before that admit, his Hgb was 4.6.    The patient reports BRBPR began afternoon of 5/23 acutely.  He activated EMS as he felt is was "a lot of blood and did not stop".  He denied abdominal pain, hematemesis, fevers, chills.  Reported one episode of non-bloody small volume yellow emesis.  He reported approximate 10 lb weight loss in the last few weeks PTA.    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 The polyp was pedunculated. The polyp was removed with a hot snare. Resection and retrieval  were complete. Multiple angioectasias with bleeding were found in the sigmoid colon, in the descending colon, in the ascending colon and in the cecum. This was most extensive in the cecum, with active bleeding.      Micro Data:  COVID 5/23 >> negative   Antimicrobials:     Interim history/subjective:  Feeling much better, today, hungry and tol regular diet   Objective   Blood pressure 105/66, pulse 75, temperature 98.4 F (36.9 C), temperature source Oral, resp. rate 14, height 5' 8.5" (1.74 m), weight 50.8 kg, SpO2 100 %.        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  Pathology negative from biopsy -  s/p embolization of cecal bleep pm 5/24 > no more active bleeding  rx per GI/IR  Acute Blood Loss Anemia    Lab Results  Component Value Date   HGB 8.3 (L) 09/15/2018   HGB 7.5 (L) 09/14/2018   HGB 9.2 (L) 09/03/2018   HGB 10.5 (L) 06/11/2018   Continue serial cbc's     AKI -in setting of blood loss, hypotension/ on ACEi PTA   Hypokalemia  Lab Results  Component Value Date   CREATININE 2.25 (H) 09/15/2018   CREATININE 2.30 (H) 09/15/2018   CREATININE 2.51 (H) 09/14/2018   CREATININE 2.01 (H) 03/23/2017   CREATININE 1.70 (H) 05/26/2016   CREATININE 1.73 (H) 02/24/2016    P: Trend BMP / urinary output  Avoid nephrotoxic agents, ensure adequate renal perfusion  (so hold acei which interfere with physiologic maint of GFR in setting of low perfusion via effects on afferent RA)     Pulmonary HTN  -hx of recurrent pericardial effusion  -WHO Group I in setting of Scleroderma  -06/2018 R/LHC with moderate to severe PAH, PA 81/26 P: Continue sildenafil, opsumit   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         Best practice:  Diet: regular diet  Pain/Anxiety/Delirium protocol (if indicated): n/a VAP protocol (if indicated): n/a DVT prophylaxis: SCD's, no heparin given GIB  GI prophylaxis: PPI  Glucose control: n/a Mobility: As tolerated with assit  Code Status: Full Code  Family Communication: n/a  Disposition: ICU for now   Labs   CBC: Recent Labs  Lab 09/14/18 1738 09/15/18 0706  WBC 7.3 4.6  NEUTROABS 5.7  --   HGB 7.5* 8.3*  HCT 24.1* 23.6*  MCV 92.7 84.9  PLT 127* 74*    Basic Metabolic Panel: Recent Labs  Lab 09/14/18 1738 09/15/18 0258 09/15/18 0706  NA 138 142 142  K 2.9* 3.3* 3.1*  CL 108 111 115*  CO2 19* 20* 19*  GLUCOSE 97 106* 79  BUN 60* 59* 60*  CREATININE 2.51* 2.30* 2.25*  CALCIUM 7.2* 6.4* 6.5*  MG  --   --  0.9*  PHOS  --   --  4.2   GFR: Estimated Creatinine Clearance: 25.4 mL/min (A) (by C-G formula based on SCr of 2.25 mg/dL (H)). Recent Labs  Lab 09/14/18 1738 09/15/18 0706  WBC 7.3 4.6    Liver Function Tests: Recent Labs  Lab 09/14/18 1738 09/15/18 0706  AST 16 11*  ALT 13 9  ALKPHOS 67 46  BILITOT 0.6 0.7  PROT 6.3* 4.3*  ALBUMIN 2.8* 2.1*   No results for input(s): LIPASE, AMYLASE in the last 168 hours. No results for input(s): AMMONIA in the last  168 hours.  ABG    Component Value Date/Time   PHART 7.485 (H) 07/01/2018 1420   PCO2ART 26.2 (L) 07/01/2018 1420   PO2ART 85.0 07/01/2018 1420   HCO3 21.9 07/01/2018 1435   TCO2 23 07/01/2018 1435   ACIDBASEDEF 2.0 07/01/2018 1435   O2SAT 61.0 07/01/2018 1435     Coagulation Profile: Recent Labs  Lab 09/14/18 1738  INR 1.2    Cardiac Enzymes:  No results for input(s): CKTOTAL, CKMB, CKMBINDEX, TROPONINI in the last 168 hours.  HbA1C: No results found for: HGBA1C  CBG: Recent Labs  Lab 09/15/18 [place]*     Christinia Gully, MD [facility] [phone] After 5:30 PM or weekends, use Beeper [phone]

## 2018-09-15 NOTE — Sedation Documentation (Signed)
PRBC Transfusion #2 complete.

## 2018-09-15 NOTE — Progress Notes (Signed)
Lab personnel called to report accuracy of hemoglobin and platlet levels are questionable. Lab personnel asking for approval to redraw cbc. Cbc will be redrawn by lab.

## 2018-09-15 NOTE — Sedation Documentation (Addendum)
Gauze/tegaderm bandage applied to R femoral artery puncture site. Groin level 0, drsg CDI. RDP absent. RPT + Doppler.

## 2018-09-15 NOTE — TOC Progression Note (Addendum)
Received pt from IR, GCS 15. 3rd unit PRBC and protonix drip infusing at peripheral IV sites. Right femoral post procedural site unremarkable. RPT pulse +doppler, RDP pulse absent by doppler,  Left popliteal pulse +doppler, LPT pulse absent by doppler, LDP pulse absent by doppler. Pt c/o feeling "cold" warm blankets applied to pt.

## 2018-09-15 NOTE — Progress Notes (Signed)
Progress Note for [name]  Subjective: No complaints.  No further hematochezia since the embolization by IR.  Objective: Vital signs in last 24 hours: Temp:  [97.6 F (36.4 C)-99.1 F (37.3 C)] 97.6 F (36.4 C) (05/23 2127) Pulse Rate:  [70-105] 72 (05/24 0700) Resp:  [12-22] 13 (05/24 0700) BP: (60-132)/(44-99) 95/66 (05/24 0700) SpO2:  [93 %-100 %] 99 % (05/24 0700) Weight:  [50.8 kg-52.4 kg] 50.8 kg (05/24 0015) Last BM Date: 09/15/18  Intake/Output from previous day: 05/23 0701 - 05/24 0700 In: 1570.4 [I.V.:888.7; Blood:681.7] Out: -  Intake/Output this shift: No intake/output data recorded.  General appearance: alert and no distress Skinner: soft, non-tender; bowel sounds normal; no masses,  no organomegaly  Lab Results: Recent Labs    09/14/18 1738  WBC 7.3  HGB 7.5*  HCT 24.1*  PLT 127*   BMET Recent Labs    09/14/18 1738 09/15/18 0258  NA 138 142  K 2.9* 3.3*  CL 108 111  CO2 19* 20*  GLUCOSE 97 106*  BUN 60* 59*  CREATININE 2.51* 2.30*  CALCIUM 7.2* 6.4*   LFT Recent Labs    09/14/18 1738  PROT 6.3*  ALBUMIN 2.8*  AST 16  ALT 13  ALKPHOS 67  BILITOT 0.6   PT/INR Recent Labs    09/14/18 1738  LABPROT 14.6  INR 1.2   Hepatitis Panel No results for input(s): HEPBSAG, HCVAB, HEPAIGM, HEPBIGM in the last 72 hours. C-Diff No results for input(s): CDIFFTOX in the last 72 hours. Fecal Lactopherrin No results for input(s): FECLLACTOFRN in the last 72 hours.  Studies/Results: Dg Chest Port 1 View  Result Date: 09/15/2018 CLINICAL DATA:  58 year old male with history of congestive heart failure. EXAM: PORTABLE CHEST 1 VIEW COMPARISON:  Chest CT 06/11/2018. FINDINGS: Lung volumes are normal. No consolidative airspace disease. Small right and trace left pleural effusions. Cephalization of the pulmonary vasculature, without frank pulmonary edema. Heart size is mild to moderately enlarged. Upper mediastinal contours are within normal limits.  IMPRESSION: 1. Cardiomegaly with pulmonary venous congestion but no frank pulmonary edema. 2. Small right and trace left pleural effusions, decreased in size compared to the prior study. Electronically Signed   By: Vinnie Langton M.D.   On: 09/15/2018 07:15   Ct Angio Abd/pel W And/or Wo Contrast  Result Date: 09/14/2018 CLINICAL DATA:  Lower Skinner bleed, history of AVMs EXAM: CTA ABDOMEN AND PELVIS WITHOUT AND WITH CONTRAST TECHNIQUE: Multidetector CT imaging of the abdomen and pelvis was performed using the standard protocol during bolus administration of intravenous contrast. Multiplanar reconstructed images and MIPs were obtained and reviewed to evaluate the vascular anatomy. CONTRAST:  43m OMNIPAQUE 350 COMPARISON:  04/03/2016 FINDINGS: VASCULAR Aorta: Initial precontrast images demonstrate heavy atherosclerotic calcifications of the aorta and its branches. No aneurysmal dilatation is noted. No evidence of dissection is seen on postcontrast images. Celiac: Variant anatomy of the celiac axis is noted with what appears to be the right hepatic artery arising directly from the aorta. Left gastric appears to arise directly from the aorta as well. The splenic artery also rises directly from the aorta. Scattered atherosclerotic changes are seen. SMA: Widely patent. Renals: Single renal arteries are identified bilaterally with mild atherosclerotic change. No focal stenosis is seen. IMA: IMA is widely patent. Iliacs: Scattered atherosclerotic changes are noted in the iliac arteries although no focal stenosis is seen. No dissection is identified. Veins: No specific venous abnormality is noted. Delayed portal venous phase images show no specific venous abnormality.  Review of the MIP images confirms the above findings. NON-VASCULAR Lower chest: Bilateral pleural effusions are seen right considerably greater than left. Some associated atelectatic changes are noted. Large pericardial effusion is seen. This appears stable  when compare with the prior exam. Hepatobiliary: No focal liver abnormality is seen. No gallstones, gallbladder wall thickening, or biliary dilatation. Pancreas: Unremarkable. No pancreatic ductal dilatation or surrounding inflammatory changes. Spleen: Normal in size without focal abnormality. Adrenals/Urinary Tract: Adrenal glands are within normal limits. Kidneys are well visualized bilaterally. No renal calculi or obstructive changes are seen. Normal enhancement is noted of the kidneys. Stomach/Bowel: Scattered fluid is noted throughout the colon. No obstructive or inflammatory changes are seen. On the arterial phase images there is a mural focus of contrast extravasation consistent with active arterial hemorrhage. Pooling in the cecum is identified. Delayed images demonstrate further active bleeding collecting within the cecum. The appendix is not well visualized. Small bowel and stomach appear within normal limits. Lymphatic: No significant lymphadenopathy is seen. Reproductive: Prostate is within normal limits. Other: No free fluid or focal herniation is noted. Musculoskeletal: Degenerative changes of lumbar spine are seen. IMPRESSION: VASCULAR Atherosclerotic changes are seen. Some variant anatomy of the celiac axis is noted. No aneurysmal dilatation or dissection is noted. NON-VASCULAR Area of active arterial hemorrhage within the cecum as described above. This may represent a small AVM given the patient's clinical history. No other areas of active extravasation are seen. Delayed venous images demonstrate continued acute hemorrhage. Large pericardial effusion which appears stable from the prior study. Large right pleural effusion. Critical Value/emergent results were called by telephone at the time of interpretation on 09/14/2018 at 9:47 pm to Dr. Vernard Gambles with IR, who verbally acknowledged these results. Additionally Dr. Vincenza Hews in the ER was notified. Electronically Signed   By: Inez Catalina M.D.   On:  09/14/2018 21:51    Medications:  Scheduled: . sodium chloride   Intravenous Once  . sodium chloride   Intravenous Once  . Chlorhexidine Gluconate Cloth  6 each Topical Daily  . fentaNYL      . lidocaine      . macitentan  10 mg Oral Daily  . midazolam      . potassium chloride  40 mEq Per Tube BID  . sildenafil  20 mg Oral TID   Continuous: . pantoprozole (PROTONIX) infusion 8 mg/hr (09/15/18 0030)    Assessment/Plan: 1) Cecal AVM bleed. 2) Anemia. 3) Hypotension.   Nursing informed me that the patient had a significant amount of bleeding from his left IV site.  I was able to see the blood and there were clots, hence his HGB may still be low.  I am appreciative of Dr. Adron Bene intervention.  As suspected, the patient had an arterial bleeding source in the cecum.  The embolization was successful as he does not have any further hematochezia.  Plan: 1) Follow HGB. 2) Transfuse as necessary.  LOS: 1 day   Odean Fester D 09/15/2018, 7:56 AM

## 2018-09-15 NOTE — Progress Notes (Signed)
eLink Physician-Brief Progress Note Patient Name: WITT PLITT DOB: 08/31/1960 MRN: [id number]   Date of Service  09/15/2018  HPI/Events of Note  S/p IR cecal embolization and in ICU.  Video: Resting with stable Vitals.   eICU Interventions  Follow Hg/hct and labs. - continue care.      Intervention Category Intermediate Interventions: Bleeding - evaluation and treatment with blood products  Elmer Sow 09/15/2018, 1:27 AM

## 2018-09-16 ENCOUNTER — Encounter (HOSPITAL_COMMUNITY): Payer: Self-pay

## 2018-09-16 DIAGNOSIS — K922 Gastrointestinal hemorrhage, unspecified: Secondary | ICD-10-CM

## 2018-09-16 DIAGNOSIS — F17200 Nicotine dependence, unspecified, uncomplicated: Secondary | ICD-10-CM

## 2018-09-16 HISTORY — DX: Nicotine dependence, unspecified, uncomplicated: F17.200

## 2018-09-16 LAB — BPAM RBC
Blood Product Expiration Date: 202006082359
Blood Product Expiration Date: 202006082359
Blood Product Expiration Date: 202006082359
Blood Product Expiration Date: 202006092359
ISSUE DATE / TIME: 202005231937
ISSUE DATE / TIME: 202005232043
ISSUE DATE / TIME: 202005232044
ISSUE DATE / TIME: 202005241819
Unit Type and Rh: 6200
Unit Type and Rh: 6200
Unit Type and Rh: 6200
Unit Type and Rh: 6200

## 2018-09-16 LAB — TYPE AND SCREEN
ABO/RH(D): A POS
Antibody Screen: NEGATIVE
Unit division: 0
Unit division: 0
Unit division: 0
Unit division: 0

## 2018-09-16 LAB — MAGNESIUM: Magnesium: 1.8 mg/dL (ref 1.7–2.4)

## 2018-09-16 LAB — BASIC METABOLIC PANEL
Anion gap: 8 (ref 5–15)
BUN: 58 mg/dL — ABNORMAL HIGH (ref 6–20)
CO2: 19 mmol/L — ABNORMAL LOW (ref 22–32)
Calcium: 7.2 mg/dL — ABNORMAL LOW (ref 8.9–10.3)
Chloride: 114 mmol/L — ABNORMAL HIGH (ref 98–111)
Creatinine, Ser: 2.31 mg/dL — ABNORMAL HIGH (ref 0.61–1.24)
GFR calc Af Amer: 35 mL/min — ABNORMAL LOW (ref >60)
GFR calc non Af Amer: 30 mL/min — ABNORMAL LOW (ref >60)
Glucose, Bld: 86 mg/dL (ref 70–99)
Potassium: 3.5 mmol/L (ref 3.5–5.1)
Sodium: 141 mmol/L (ref 135–145)

## 2018-09-16 LAB — CBC
HCT: 22.7 % — ABNORMAL LOW (ref 39.0–52.0)
HCT: 23.8 % — ABNORMAL LOW (ref 39.0–52.0)
Hemoglobin: 8.1 g/dL — ABNORMAL LOW (ref 13.0–17.0)
Hemoglobin: 8.4 g/dL — ABNORMAL LOW (ref 13.0–17.0)
MCH: 30.3 pg (ref 26.0–34.0)
MCH: 30.1 pg (ref 26.0–34.0)
MCHC: 35.7 g/dL (ref 30.0–36.0)
MCHC: 35.3 g/dL (ref 30.0–36.0)
MCV: 85 fL (ref 80.0–100.0)
MCV: 85.3 fL (ref 80.0–100.0)
Platelets: 70 10*3/uL — ABNORMAL LOW (ref 150–400)
Platelets: 73 10*3/uL — ABNORMAL LOW (ref 150–400)
RBC: 2.67 MIL/uL — ABNORMAL LOW (ref 4.22–5.81)
RBC: 2.79 MIL/uL — ABNORMAL LOW (ref 4.22–5.81)
RDW: 14.8 % (ref 11.5–15.5)
RDW: 14.8 % (ref 11.5–15.5)
WBC: 5.7 10*3/uL (ref 4.0–10.5)
WBC: 5.4 10*3/uL (ref 4.0–10.5)
nRBC: 0 % (ref 0.0–0.2)
nRBC: 0 % (ref 0.0–0.2)

## 2018-09-16 MED ORDER — NICOTINE 14 MG/24HR TD PT24
14.0000 mg | MEDICATED_PATCH | Freq: Every day | TRANSDERMAL | Status: DC
  Administered 2018-09-16 – 2018-09-17 (×2): 14 mg via TRANSDERMAL
  Filled 2018-09-16 (×2): qty 1

## 2018-09-16 MED ORDER — PANTOPRAZOLE SODIUM 40 MG PO TBEC
40.0000 mg | DELAYED_RELEASE_TABLET | Freq: Two times a day (BID) | ORAL | Status: DC
  Administered 2018-09-16 – 2018-09-17 (×3): 40 mg via ORAL
  Filled 2018-09-16 (×3): qty 1

## 2018-09-16 MED ORDER — VITAMIN B-12 1000 MCG PO TABS
2500.0000 ug | ORAL_TABLET | Freq: Every day | ORAL | Status: DC
  Administered 2018-09-16 – 2018-09-17 (×2): 2500 ug via ORAL
  Filled 2018-09-16 (×2): qty 3

## 2018-09-16 MED ORDER — ALPRAZOLAM 0.5 MG PO TABS: 0.5000 mg | ORAL_TABLET | Freq: Every day | ORAL | Status: DC | PRN

## 2018-09-16 MED ORDER — ALLOPURINOL 100 MG PO TABS
100.0000 mg | ORAL_TABLET | Freq: Every day | ORAL | Status: DC
  Administered 2018-09-16 – 2018-09-17 (×2): 100 mg via ORAL
  Filled 2018-09-16 (×3): qty 1

## 2018-09-16 MED ORDER — ACETAMINOPHEN 325 MG PO TABS: 650.0000 mg | ORAL_TABLET | Freq: Four times a day (QID) | ORAL | Status: DC | PRN

## 2018-09-16 MED ORDER — ALPRAZOLAM 0.5 MG PO TABS: 0.5000 mg | ORAL_TABLET | Freq: Three times a day (TID) | ORAL | Status: DC | PRN

## 2018-09-16 MED ORDER — ENSURE ENLIVE PO LIQD
237.0000 mL | Freq: Three times a day (TID) | ORAL | Status: DC
  Administered 2018-09-16 – 2018-09-17 (×2): 237 mL via ORAL

## 2018-09-16 MED ORDER — B-12 2500 MCG PO TABS: 2500.0000 ug | ORAL_TABLET | Freq: Every day | ORAL | Status: DC

## 2018-09-16 MED ORDER — ADULT MULTIVITAMIN W/MINERALS CH
1.0000 | ORAL_TABLET | Freq: Every day | ORAL | Status: DC
  Administered 2018-09-16 – 2018-09-17 (×2): 1 via ORAL
  Filled 2018-09-16 (×2): qty 1

## 2018-09-16 MED ORDER — HYDROCODONE-ACETAMINOPHEN 10-325 MG PO TABS: 0.5000 | ORAL_TABLET | Freq: Four times a day (QID) | ORAL | Status: DC | PRN

## 2018-09-16 MED ORDER — SIMVASTATIN 20 MG PO TABS
40.0000 mg | ORAL_TABLET | Freq: Every day | ORAL | Status: DC
  Administered 2018-09-16: 40 mg via ORAL
  Filled 2018-09-16: qty 2

## 2018-09-16 MED ORDER — HYDROCODONE-ACETAMINOPHEN 10-325 MG PO TABS: 0.5000 | ORAL_TABLET | Freq: Two times a day (BID) | ORAL | Status: DC | PRN

## 2018-09-16 NOTE — Progress Notes (Signed)
Pt transported to [facility] with NT and all belongings.

## 2018-09-16 NOTE — Progress Notes (Signed)
Pt arrived to unit. Resting comfortably in bed with call bell in reach.

## 2018-09-16 NOTE — Progress Notes (Signed)
NAME:  [name], MRN:  [id number], DOB:  1961-03-08, LOS: 2 ADMISSION DATE:  09/14/2018, CONSULTATION DATE:  09/14/18 REFERRING MD:  Dr. Tamera Punt, CHIEF COMPLAINT:  GIB    Brief History   58 y/o M smoker who presented to [facility] 5/23 with reports of acute onset BRBPR.  The patient had a colonoscopy / EGD on 5/8 with findings of bleeding proximal colon AVM's, proximal small bowel AVM's. BRBPR began afternoon of 5/23 and he reported to ER via EMS.    History of present illness   58 y/o M who presented to [facility] 5/23 with reports of acute onset BRBPR.  The patient had a colonoscopy & EGD on 5/8 with findings of bleeding proximal colon AVM's, proximal small bowel AVM's. His Hgb was ~ 9 at time of discharge.  Before that admit, his Hgb was 4.6.    The patient reports BRBPR began afternoon of 5/23 acutely.  He activated EMS as he felt is was "a lot of blood and did not stop".  He denied abdominal pain, hematemesis, fevers, chills.  Reported one episode of non-bloody small volume yellow emesis.  He reported approximate 10 lb weight loss in the last few weeks PTA.    Past Medical History  Scleroderma  Raynaud's Phenomenon Pericardial Effusion s/p subxiphoid window CAD s/p MI, dRCA stent 2010, LHC 06/2018 with widely patent stent CHF - grade II diastolic dysfunction  PH - moderate to severe with PA 81/26 (48), PCW 17, PVC 6.0 06/2018 RV Failure with Cor Pulmonale  ETOH Abuse HTN  HLD  Gout  Anxiety   Significant Hospital Events   5/23 Admit with BRBPR  5/25 transfer to floor  Consults:  GI   Procedures:  Embolization cecal bleed  by IR pm 5/23   Significant Diagnostic Tests:  EGD 5/08 >> gastritis, congestive gastropathy with endoscopic appearance of malignancy, four bleeding angioectasias treated with argon plasma coagulation,  Nodular granular mucosa in duodenum  Colonoscopy 5/08 >> 12 mm polyp was found in the transverse colon. The polyp was pedunculated. The polyp was removed with a hot  snare. Resection and retrieval were complete. Multiple angioectasias with bleeding were found in the sigmoid colon, in the descending colon, in the ascending colon and in the cecum. This was most extensive in the cecum, with active bleeding.   Micro Data:  COVID 5/23 >> negative   Antimicrobials:     Interim history/subjective:  Denies chest, abd pain.  Objective   Blood pressure 111/70, pulse 70, temperature 98.6 F (37 C), temperature source Oral, resp. rate 17, height 5' 8.5" (1.74 m), weight 50.6 kg, SpO2 100 %.        Intake/Output Summary (Last 24 hours) at 09/16/2018 0958 Last data filed at 09/16/2018 0900 Gross per 24 hour  Intake 2426.21 ml  Output 700 ml  Net 1726.21 ml   Filed Weights   09/14/18 1729 09/15/18 0015 09/16/18 0113  Weight: 52.4 kg 50.8 kg 50.6 kg    Examination:  General - alert Eyes - pupils reactive ENT - no sinus tenderness, no stridor Cardiac - regular rate/rhythm, no murmur Chest - equal breath sounds b/l, no wheezing or rales Abdomen - soft, non tender, + bowel sounds Extremities - no cyanosis, clubbing, or edema Skin - no rashes Neuro - normal strength, moves extremities, follows commands   Resolved Hospital Problem list      Assessment & Plan:   Acute lower GI bleeding with acute blood loss 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 Plan - f/u Hb - transfuse for Hb < 7 or bleeding - d/c protonix GTT (not sure why he was getting this)   Acute kidney injury from hypovolemia, hypotension, and ACE inhibitor use >> baseline creatinine 2.01 from 03/23/17. 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 Plan - continue sildenafil, opsumit    Hx of CAD, HTN, HLD. Plan - hold outpt lisinopril - resume zocor  Hx of gout. Plan - allopurinol  Tobacco abuse. Plan - nicotine patch  Anxiety, chronic pain. Plan - prn tylenol, norco, xanax  Best practice:  Diet: regular diet   DVT prophylaxis: SCD's GI prophylaxis: Protonix Mobility: OOB Code Status: Full Code   Disposition: to floor bed 5/25 >> if stable, then likely d/c home 5/26  Labs    CMP Latest Ref Rng & Units 09/16/2018 09/15/2018 09/15/2018  Glucose 70 - 99 mg/dL 86 92 79  BUN 6 - 20 mg/dL 58(H) 62(H) 60(H)  Creatinine 0.61 - 1.24 mg/dL 2.31(H) 2.34(H) 2.25(H)  Sodium 135 - 145 mmol/L 141 139 142  Potassium 3.5 - 5.1 mmol/L 3.5 3.7 3.1(L)  Chloride 98 - 111 mmol/L 114(H) 114(H) 115(H)  CO2 22 - 32 mmol/L 19(L) 16(L) 19(L)  Calcium 8.9 - 10.3 mg/dL 7.2(L) 6.9(L) 6.5(L)  Total Protein 6.5 - 8.1 g/dL - - 4.3(L)  Total Bilirubin 0.3 - 1.2 mg/dL - - 0.7  Alkaline Phos 38 - 126 U/L - - 46  AST 15 - 41 U/L - - 11(L)  ALT 0 - 44 U/L - - 9   CBC Latest Ref Rng & Units 09/16/2018 09/15/2018 09/15/2018  WBC 4.0 - 10.5 K/uL 5.4 5.7 5.5  Hemoglobin 13.0 - 17.0 g/dL 8.4(L) 8.1(L) 6.7(LL)  Hematocrit 39.0 - 52.0 % 23.8(L) 22.7(L) 20.1(L)  Platelets 150 - 400 K/uL 73(L) 70(L) 75(L)   CBG (last 3)  Recent Labs    09/15/18 [place], MD [place] 09/16/2018, 10:10 AM

## 2018-09-16 NOTE — Progress Notes (Signed)
Spoke to patient's wife who states she would like to speak with physician regarding patient's course. Will relay to team. Pt 1ppd smoker, will request nicotine patch.

## 2018-09-16 NOTE — Progress Notes (Signed)
Patient got up to [facility] and had a burgundy, BM that was mostly clots, medium sized. VSS. Patient ambulated without distress on room air. Farley notified. Patient is now resting in bed with call bell within reach. Awaiting results of post cbc. Will continue to monitor patient.

## 2018-09-16 NOTE — Progress Notes (Signed)
Initial Nutrition Assessment   RD working remotely.   DOCUMENTATION CODES:   Not applicable  INTERVENTION:   Add MVI with Minerals  Increase Ensure Enlive po to TID, each supplement provides 350 kcal and 20 grams of protein  NUTRITION DIAGNOSIS:   Inadequate oral intake related to poor appetite as evidenced by meal completion < 50%.  GOAL:   Patient will meet greater than or equal to 90% of their needs  MONITOR:   PO intake, Supplement acceptance, Labs, Weight trends  REASON FOR ASSESSMENT:   Malnutrition Screening Tool    ASSESSMENT:   58 yo admitted on 5/23 with BRBPR with lower GI bleed. PMH includes scleroderma, CAD, MI, CHF, EtOH abuse, HTN, HLD, gout, anxiety  5/23 Bleeding from cecal AVM s/p embolization  Recorded po intake 25-50% of meals  Pt reports 10 pound weight loss in past few weeks; noted pt is Underweight.   Labs: Creatinine 2.31, BUN 58, magnesium wdl Meds: B-12, mag sulfate   NUTRITION - FOCUSED PHYSICAL EXAM:  Unable to assess, working remotely  Diet Order:   Diet Order            Diet regular Room service appropriate? Yes; Fluid consistency: Thin  Diet effective now              EDUCATION NEEDS:   Not appropriate for education at this time  Skin:  Skin Assessment: Reviewed RN Assessment  Last BM:  5/25  Height:   Ht Readings from Last 1 Encounters:  09/15/18 5' 8.5" (1.74 m)    Weight:   Wt Readings from Last 1 Encounters:  09/16/18 50.6 kg    Ideal Body Weight:  71.4 kg  BMI:  Body mass index is 16.71 kg/m.  Estimated Nutritional Needs:   Kcal:  4801-6553 kcals   Protein:  90-105 g   Fluid:  >/= 1.8 L   Kerman Passey MS, RD, LDN, CNSC [phone] Pager  [phone] Weekend/On-Call Pager

## 2018-09-16 NOTE — Progress Notes (Signed)
One unit of PRBC's finished. VSS. Afebrile. No reaction noted. Awaiting results of post cbc. Will continue to monitor.

## 2018-09-16 NOTE — Progress Notes (Signed)
Progress Note for [name]  Subjective: No complaints.  [name] had one maroon colored stool yesterday.  Objective: Vital signs in last 24 hours: Temp:  [98.4 F (36.9 C)-99.1 F (37.3 C)] 98.6 F (37 C) (05/25 0730) Pulse Rate:  [62-90] 66 (05/25 0700) Resp:  [13-23] 16 (05/25 0700) BP: (84-127)/(58-80) 110/67 (05/25 0700) SpO2:  [93 %-100 %] 100 % (05/25 0700) Weight:  [50.6 kg] 50.6 kg (05/25 0113) Last BM Date: 09/15/18  Intake/Output from previous day: 05/24 0701 - 05/25 0700 In: 1898.5 [P.O.:720; I.V.:655.5; Blood:423; IV Piggyback:100] Out: 1100 [Urine:1100] Intake/Output this shift: No intake/output data recorded.  General appearance: alert and no distress Skinner: soft, non-tender; bowel sounds normal; no masses,  no organomegaly  Lab Results: Recent Labs    09/15/18 1545 09/15/18 2259 09/16/18 0355  WBC 5.5 5.7 5.4  HGB 6.7* 8.1* 8.4*  HCT 20.1* 22.7* 23.8*  PLT 75* 70* 73*   BMET Recent Labs    09/15/18 0706 09/15/18 1403 09/16/18 0355  NA 142 139 141  K 3.1* 3.7 3.5  CL 115* 114* 114*  CO2 19* 16* 19*  GLUCOSE 79 92 86  BUN 60* 62* 58*  CREATININE 2.25* 2.34* 2.31*  CALCIUM 6.5* 6.9* 7.2*   LFT Recent Labs    09/15/18 0706  PROT 4.3*  ALBUMIN 2.1*  AST 11*  ALT 9  ALKPHOS 46  BILITOT 0.7   PT/INR Recent Labs    09/14/18 1738  LABPROT 14.6  INR 1.2   Hepatitis Panel No results for input(s): HEPBSAG, HCVAB, HEPAIGM, HEPBIGM in Cody last 72 hours. C-Diff No results for input(s): CDIFFTOX in Cody last 72 hours. Fecal Lactopherrin No results for input(s): FECLLACTOFRN in Cody last 72 hours.  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 [name] had a colonoscopy & EGD on 5/8 with findings of bleeding proximal colon AVM's, proximal small bowel AVM's. His Hgb was ~  9 at time of discharge. Before that admit, his Hgb was 4.6. Chronic renal insufficiency. Low-dose CTA was obtained for better localization, demonstrating active extravasation in Cody cecum. We are asked to see [name] for mesenteric arteriography and possible embolization. EXAM: SELECTIVE VISCERAL ARTERIOGRAPHY; IR EMBO ART VEN HEMORR LYMPH EXTRAV INC GUIDE ROADMAPPING; ADDITIONAL ARTERIOGRAPHY; IR ULTRASOUND GUIDANCE VASC ACCESS RIGHT ANESTHESIA/SEDATION: Intravenous Fentanyl 54mg and Versed 1.59mwere administered as conscious sedation during continuous monitoring of [name]'s level of consciousness and physiological / cardiorespiratory status by Cody radiology RN, with a total moderate sedation time of of 45 minutes. MEDICATIONS: Lidocaine 1% subcutaneous CONTRAST:  5037mMNIPAQUE IOHEXOL 300 MG/ML SOLN, 6m43mNIPAQUE IOHEXOL 300 MG/ML SOLN PROCEDURE: Cody procedure, risks (including but not limited to bleeding, infection, organ damage ), benefits, and alternatives were explained to [name]. Questions regarding Cody procedure were encouraged and answered. [name] understands and consents to Cody procedure. Right femoral region prepped and draped in usual sterile fashion. Maximal barrier sterile technique was utilized including caps, mask, sterile gowns, sterile gloves, sterile drape, hand hygiene and skin antiseptic. Cody right common femoral artery was localized under ultrasound. Under real-time ultrasound guidance, Cody vessel was accessed with a 21-gauge micropuncture needle, exchanged over a 018 guidewire for a transitional dilator, through which a 035 guidewire was advanced. Over this, a 5 FrenPakistancular sheath was placed, through which a 5 FrenPakistancatheter was advanced and  used to selectively catheterize Cody superior mesenteric artery for selective arteriography. Coaxial microcatheter was advanced into third order SMA branches to localize bleeding source. Coil embolization using a 2 mm x 4 cm  interlock coil was performed. Follow-up immediate and delayed selective arteriography in multiple projections was performed. Cody catheter and sheath were removed and hemostasis achieved with Cody aid of Cody Exoseal device after confirmatory femoral arteriography. [name] tolerated Cody procedure well. COMPLICATIONS: None immediate FINDINGS: Active extravasation in Cody cecum was confirmed corresponding to CT findings. Cody third or supplying branch was identified and catheterized. Confirmatory arteriography was performed. Coil embolization of Cody bleeding branch was performed. No further extravasation was evident. IMPRESSION: 1. POSITIVE for active arterial extravasation Cody cecum. 2. Technically successful subselective coil embolization of supplying third order SMA branch, with cessation of extravasation. Electronically Signed   By: Lucrezia Europe M.D.   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 [name] had a colonoscopy & EGD on 5/8 with findings of bleeding proximal colon AVM's, proximal small bowel AVM's. His Hgb was ~ 9 at time of discharge. Before that admit, his Hgb was 4.6. Chronic renal insufficiency. Low-dose CTA was obtained for better localization, demonstrating active extravasation in Cody cecum. We are asked to see [name] for mesenteric arteriography and possible embolization. EXAM: SELECTIVE VISCERAL ARTERIOGRAPHY; IR EMBO ART VEN HEMORR LYMPH EXTRAV INC GUIDE ROADMAPPING; ADDITIONAL ARTERIOGRAPHY; IR ULTRASOUND GUIDANCE VASC ACCESS RIGHT ANESTHESIA/SEDATION: Intravenous Fentanyl 77mg and Versed 1.594mwere administered as conscious sedation during continuous monitoring of [name]'s level of consciousness and physiological / cardiorespiratory status by Cody radiology RN, with a total moderate  sedation time of of 45 minutes. MEDICATIONS: Lidocaine 1% subcutaneous CONTRAST:  5022mMNIPAQUE IOHEXOL 300 MG/ML SOLN, 69m39mNIPAQUE IOHEXOL 300 MG/ML SOLN PROCEDURE: Cody procedure, risks (including but not limited to bleeding, infection, organ damage ), benefits, and alternatives were explained to [name]. Questions regarding Cody procedure were encouraged and answered. [name] understands and consents to Cody procedure. Right femoral region prepped and draped in usual sterile fashion. Maximal barrier sterile technique was utilized including caps, mask, sterile gowns, sterile gloves, sterile drape, hand hygiene and skin antiseptic. Cody right common femoral artery was localized under ultrasound. Under real-time ultrasound guidance, Cody vessel was accessed with a 21-gauge micropuncture needle, exchanged over a 018 guidewire for a transitional dilator, through which a 035 guidewire was advanced. Over this, a 5 FrenPakistancular sheath was placed, through which a 5 FrenPakistancatheter was advanced and used to selectively catheterize Cody superior mesenteric artery for selective arteriography. Coaxial microcatheter was advanced into third order SMA branches to localize bleeding source. Coil embolization using a 2 mm x 4 cm interlock coil was performed. Follow-up immediate and delayed selective arteriography in multiple projections was performed. Cody catheter and sheath were removed and hemostasis achieved with Cody aid of Cody Exoseal device after confirmatory femoral arteriography. [name] tolerated Cody procedure well. COMPLICATIONS: None immediate FINDINGS: Active extravasation in Cody cecum was confirmed corresponding to CT findings. Cody third or supplying branch was identified and catheterized. Confirmatory arteriography was performed. Coil embolization of Cody bleeding branch was performed. No further extravasation was evident. IMPRESSION: 1. POSITIVE for active arterial extravasation Cody cecum. 2. Technically  successful subselective coil embolization of supplying third order SMA branch, with cessation  of extravasation. Electronically Signed   By: Lucrezia Europe M.D.   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 [name] had a colonoscopy & EGD on 5/8 with findings of bleeding proximal colon AVM's, proximal small bowel AVM's. His Hgb was ~ 9 at time of discharge. Before that admit, his Hgb was 4.6. Chronic renal insufficiency. Low-dose CTA was obtained for better localization, demonstrating active extravasation in Cody cecum. We are asked to see [name] for mesenteric arteriography and possible embolization. EXAM: SELECTIVE VISCERAL ARTERIOGRAPHY; IR EMBO ART VEN HEMORR LYMPH EXTRAV INC GUIDE ROADMAPPING; ADDITIONAL ARTERIOGRAPHY; IR ULTRASOUND GUIDANCE VASC ACCESS RIGHT ANESTHESIA/SEDATION: Intravenous Fentanyl 2mg and Versed 1.557mwere administered as conscious sedation during continuous monitoring of [name]'s level of consciousness and physiological / cardiorespiratory status by Cody radiology RN, with a total moderate sedation time of of 45 minutes. MEDICATIONS: Lidocaine 1% subcutaneous CONTRAST:  5030mMNIPAQUE IOHEXOL 300 MG/ML SOLN, 85m41mNIPAQUE IOHEXOL 300 MG/ML SOLN PROCEDURE: Cody procedure, risks (including but not limited to bleeding, infection, organ damage ), benefits, and alternatives were explained to [name]. Questions regarding Cody procedure were encouraged and answered. [name] understands and consents to Cody procedure. Right femoral region prepped and draped in usual sterile fashion. Maximal barrier sterile technique was utilized including caps, mask, sterile gowns, sterile gloves, sterile drape, hand hygiene and skin antiseptic. Cody right common femoral artery was localized under ultrasound. Under  real-time ultrasound guidance, Cody vessel was accessed with a 21-gauge micropuncture needle, exchanged over a 018 guidewire for a transitional dilator, through which a 035 guidewire was advanced. Over this, a 5 FrenPakistancular sheath was placed, through which a 5 FrenPakistancatheter was advanced and used to selectively catheterize Cody superior mesenteric artery for selective arteriography. Coaxial microcatheter was advanced into third order SMA branches to localize bleeding source. Coil embolization using a 2 mm x 4 cm interlock coil was performed. Follow-up immediate and delayed selective arteriography in multiple projections was performed. Cody catheter and sheath were removed and hemostasis achieved with Cody aid of Cody Exoseal device after confirmatory femoral arteriography. [name] tolerated Cody procedure well. COMPLICATIONS: None immediate FINDINGS: Active extravasation in Cody cecum was confirmed corresponding to CT findings. Cody third or supplying branch was identified and catheterized. Confirmatory arteriography was performed. Coil embolization of Cody bleeding branch was performed. No further extravasation was evident. IMPRESSION: 1. POSITIVE for active arterial extravasation Cody cecum. 2. Technically successful subselective coil embolization of supplying third order SMA branch, with cessation of extravasation. Electronically Signed   By: D  HLucrezia Europe.   On: 09/15/2018 12:13   Dg Chest Port 1 View  Result Date: 09/15/2018 CLINICAL DATA:  57 y62r old male with history of congestive heart failure. EXAM: PORTABLE CHEST 1 VIEW COMPARISON:  Chest CT 06/11/2018. FINDINGS: Lung volumes are normal. No consolidative airspace disease. Small right and trace left pleural effusions. Cephalization of Cody pulmonary vasculature, without frank pulmonary edema. Heart size is mild to moderately enlarged. Upper mediastinal contours are within normal limits. IMPRESSION: 1. Cardiomegaly with pulmonary venous congestion but  no frank pulmonary edema. 2. Small right and trace left pleural effusions, decreased in size compared to Cody prior study. Electronically Signed   By: DaniVinnie Langton.   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 [name] had a colonoscopy & EGD on 5/8 with findings of bleeding proximal colon AVM's, proximal small bowel AVM's. His Hgb was ~ 9 at time of discharge. Before that admit, his Hgb was 4.6. Chronic renal insufficiency. Low-dose CTA was obtained for better localization, demonstrating active extravasation in Cody cecum. We are asked to see [name] for mesenteric arteriography and possible embolization. EXAM: SELECTIVE VISCERAL ARTERIOGRAPHY; IR EMBO ART VEN HEMORR LYMPH EXTRAV INC GUIDE ROADMAPPING; ADDITIONAL ARTERIOGRAPHY; IR ULTRASOUND GUIDANCE VASC ACCESS RIGHT ANESTHESIA/SEDATION: Intravenous Fentanyl 67mg and Versed 1.519mwere administered as conscious sedation during continuous monitoring of [name]'s level of consciousness and physiological / cardiorespiratory status by Cody radiology RN, with a total moderate sedation time of of 45 minutes. MEDICATIONS: Lidocaine 1% subcutaneous CONTRAST:  5077mMNIPAQUE IOHEXOL 300 MG/ML SOLN, 16m44mNIPAQUE IOHEXOL 300 MG/ML SOLN PROCEDURE: Cody procedure, risks (including but not limited to bleeding, infection, organ damage ), benefits, and alternatives were explained to [name]. Questions regarding Cody procedure were encouraged and answered. [name] understands and consents to Cody procedure. Right femoral region prepped and draped in usual sterile fashion. Maximal barrier sterile technique was utilized including caps, mask, sterile gowns, sterile gloves, sterile drape, hand hygiene and skin antiseptic. Cody right common femoral  artery was localized under ultrasound. Under real-time ultrasound guidance, Cody vessel was accessed with a 21-gauge micropuncture needle, exchanged over a 018 guidewire for a transitional dilator, through which a 035 guidewire was advanced. Over this, a 5 FrenPakistancular sheath was placed, through which a 5 FrenPakistancatheter was advanced and used to selectively catheterize Cody superior mesenteric artery for selective arteriography. Coaxial microcatheter was advanced into third order SMA branches to localize bleeding source. Coil embolization using a 2 mm x 4 cm interlock coil was performed. Follow-up immediate and delayed selective arteriography in multiple projections was performed. Cody catheter and sheath were removed and hemostasis achieved with Cody aid of Cody Exoseal device after confirmatory femoral arteriography. [name] tolerated Cody procedure well. COMPLICATIONS: None immediate FINDINGS: Active extravasation in Cody cecum was confirmed corresponding to CT findings. Cody third or supplying branch was identified and catheterized. Confirmatory arteriography was performed. Coil embolization of Cody bleeding branch was performed. No further extravasation was evident. IMPRESSION: 1. POSITIVE for active arterial extravasation Cody cecum. 2. Technically successful subselective coil embolization of supplying third order SMA branch, with cessation of extravasation. Electronically Signed   By: D  HLucrezia Europe.   On: 09/15/2018 12:13   Ct Angio Abd/pel W And/or Wo Contrast  Result Date: 09/14/2018 CLINICAL DATA:  Lower Skinner bleed, history of AVMs EXAM: CTA ABDOMEN AND PELVIS WITHOUT AND WITH CONTRAST TECHNIQUE: Multidetector CT imaging of Cody abdomen and pelvis was performed using Cody standard protocol during bolus administration of intravenous contrast. Multiplanar reconstructed images and MIPs were obtained and reviewed to evaluate Cody vascular anatomy. CONTRAST:  80mL38mIPAQUE 350 COMPARISON:  04/03/2016 FINDINGS:  VASCULAR Aorta: Initial precontrast images demonstrate heavy atherosclerotic calcifications of Cody aorta and its branches. No aneurysmal dilatation is noted. No evidence of dissection is seen on postcontrast images. Celiac: Variant anatomy of Cody celiac axis is noted with what appears to be Cody right hepatic artery arising directly from Cody aorta. Left gastric appears to arise directly from Cody aorta as well. Cody splenic artery also rises directly  from Cody aorta. Scattered atherosclerotic changes are seen. SMA: Widely patent. Renals: Single renal arteries are identified bilaterally with mild atherosclerotic change. No focal stenosis is seen. IMA: IMA is widely patent. Iliacs: Scattered atherosclerotic changes are noted in Cody iliac arteries although no focal stenosis is seen. No dissection is identified. Veins: No specific venous abnormality is noted. Delayed portal venous phase images show no specific venous abnormality. Review of Cody MIP images confirms Cody above findings. NON-VASCULAR Lower chest: Bilateral pleural effusions are seen right considerably greater than left. Some associated atelectatic changes are noted. Large pericardial effusion is seen. This appears stable when compare with Cody prior exam. Hepatobiliary: No focal liver abnormality is seen. No gallstones, gallbladder wall thickening, or biliary dilatation. Pancreas: Unremarkable. No pancreatic ductal dilatation or surrounding inflammatory changes. Spleen: Normal in size without focal abnormality. Adrenals/Urinary Tract: Adrenal glands are within normal limits. Kidneys are well visualized bilaterally. No renal calculi or obstructive changes are seen. Normal enhancement is noted of Cody kidneys. Stomach/Bowel: Scattered fluid is noted throughout Cody colon. No obstructive or inflammatory changes are seen. On Cody arterial phase images there is a mural focus of contrast extravasation consistent with active arterial hemorrhage. Pooling in Cody cecum is  identified. Delayed images demonstrate further active bleeding collecting within Cody cecum. Cody appendix is not well visualized. Small bowel and stomach appear within normal limits. Lymphatic: No significant lymphadenopathy is seen. Reproductive: Prostate is within normal limits. Other: No free fluid or focal herniation is noted. Musculoskeletal: Degenerative changes of lumbar spine are seen. IMPRESSION: VASCULAR Atherosclerotic changes are seen. Some variant anatomy of Cody celiac axis is noted. No aneurysmal dilatation or dissection is noted. NON-VASCULAR Area of active arterial hemorrhage within Cody cecum as described above. This may represent a small AVM given [name]'s clinical history. No other areas of active extravasation are seen. Delayed venous images demonstrate continued acute hemorrhage. Large pericardial effusion which appears stable from Cody prior study. Large right pleural effusion. Critical Value/emergent results were called by telephone at Cody time of interpretation on 09/14/2018 at 9:47 pm to Dr. Vernard Gambles with IR, who verbally acknowledged these results. Additionally Dr. Vincenza Hews in Cody ER was notified. Electronically Signed   By: Inez Catalina M.D.   On: 09/14/2018 21:51    Medications:  Scheduled: . Chlorhexidine Gluconate Cloth  6 each Topical Daily  . feeding supplement (ENSURE ENLIVE)  237 mL Oral BID BM  . macitentan  10 mg Oral Daily  . sildenafil  20 mg Oral TID   Continuous: . 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 2) Anemia.   [name] had one maroon colored stool last evening.  Nursing reports that it appears to be old blood, which is most likely Cody case as [name] had arterial bleeding on presentation.  His HGB is currently stable and [name] feels well.  Plan: 1) Okay to transfer to a regular floor. 2) Continue to monitor for bleeding and follow HGB. 3) ? D/C home tomorrow. 4)  Gervais Skinner will resume care.  LOS: 2 days   Croy Drumwright D 09/16/2018, 8:04 AM

## 2018-09-17 LAB — CBC
HCT: 24.4 % — ABNORMAL LOW (ref 39.0–52.0)
Hemoglobin: 8.4 g/dL — ABNORMAL LOW (ref 13.0–17.0)
MCH: 30.3 pg (ref 26.0–34.0)
MCHC: 34.4 g/dL (ref 30.0–36.0)
MCV: 88.1 fL (ref 80.0–100.0)
Platelets: 85 10*3/uL — ABNORMAL LOW (ref 150–400)
RBC: 2.77 MIL/uL — ABNORMAL LOW (ref 4.22–5.81)
RDW: 15.5 % (ref 11.5–15.5)
WBC: 4.5 10*3/uL (ref 4.0–10.5)
nRBC: 0 % (ref 0.0–0.2)

## 2018-09-17 LAB — BASIC METABOLIC PANEL
Anion gap: 9 (ref 5–15)
BUN: 50 mg/dL — ABNORMAL HIGH (ref 6–20)
CO2: 19 mmol/L — ABNORMAL LOW (ref 22–32)
Calcium: 7.9 mg/dL — ABNORMAL LOW (ref 8.9–10.3)
Chloride: 114 mmol/L — ABNORMAL HIGH (ref 98–111)
Creatinine, Ser: 2.04 mg/dL — ABNORMAL HIGH (ref 0.61–1.24)
GFR calc Af Amer: 40 mL/min — ABNORMAL LOW (ref >60)
GFR calc non Af Amer: 35 mL/min — ABNORMAL LOW (ref >60)
Glucose, Bld: 90 mg/dL (ref 70–99)
Potassium: 3.3 mmol/L — ABNORMAL LOW (ref 3.5–5.1)
Sodium: 142 mmol/L (ref 135–145)

## 2018-09-17 LAB — NOVEL CORONAVIRUS, NAA (HOSP ORDER, SEND-OUT TO REF LAB; TAT 18-24 HRS): SARS-CoV-2, NAA: NOT DETECTED

## 2018-09-17 MED ORDER — POTASSIUM CHLORIDE CRYS ER 20 MEQ PO TBCR
40.0000 meq | EXTENDED_RELEASE_TABLET | Freq: Once | ORAL | Status: AC
  Administered 2018-09-17: 40 meq via ORAL
  Filled 2018-09-17: qty 2

## 2018-09-17 NOTE — Plan of Care (Signed)
  Problem: Education: Goal: Ability to identify signs and symptoms of gastrointestinal bleeding will improve Outcome: Adequate for Discharge   Problem: Bowel/Gastric: Goal: Will show no signs and symptoms of gastrointestinal bleeding Outcome: Completed/Met   Problem: Activity: Goal: Risk for activity intolerance will decrease Outcome: Adequate for Discharge

## 2018-09-17 NOTE — Progress Notes (Signed)
Pt given discharge instructions and gone over with him. Pt verbalized understanding. Pt in no distress at discharge.

## 2018-09-17 NOTE — Discharge Summary (Signed)
Physician Discharge Summary         Patient ID: [name] MRN: [id number] DOB/AGE: [birth year] 58 y.o.  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  Not on antiplatelet or anticoagulation.  He denied abdominal pain, hematemesis, fevers, chills. Reported one episode of non-bloody small volume yellow emesis.  He reported approximate 10 lb weight loss in the last few weeks. The patient had a colonoscopy & EGD on 5/8 with findings of bleeding proximal colon AVM's, proximal small bowel AVM's. His Hgb was ~ 9 at time of discharge.  He was found to be hypotensive as low as 60/44 in the emergency department and bedside nurse reports enough blood in stool to fill a 1 L bedside urinal.  Now normotensive and relatively well-appearing after receiving 1 unit packed red blood cells and bolus of IV fluids.  Initial Hgb 6.7 with additional AKI with sCr of 2.51 Seen on CTA to have findings concerning for active hemorrhage, possibly due to previously noted atrial venous malformations.  Gastroenterology and Radiology IR were consulted.  Patient underwent coil embolization of the cecal artery on 5/23 in IR.  Patient returned to ICU for monitoring.  He had no further hematochezia and Hgb counts remained stable.  He was transferred to the floor on 5/25 and since remained asymptomatic, tolerating his diet, hemodynamically stable, Hgb stable and improving sCr now to baseline around 2.01.  He is without complaint and ready to go home.  His last bowel movement was 5/25 and improving maroon color.  He will be discharged home.  His wife has appointments already set up from previous  hospitalization.    Discharge Plan by Active Problems    Acute lower GI bleeding with acute blood loss anemia from cecal AVM s/p embolization. Plan Spoke with GI who will arrange outpatient follow-up.  [place] for patient to be discharge home.  No need to continue protonix at this time.   Acute Blood loss anemia Plan Hgb stable since embolization and 1 unit PRBC transfusion on 5/23.  Repeat CBC in 1 week   Acute kidney injury from hypovolemia, hypotension, and ACE inhibitor use >> baseline creatinine 2.01 from 03/23/17. Plan Follow-up with primary care MD in 1 week, Dr. Dagmar Hait.  Will need to recheck BMP.  Consider nephrology consult for chronic renal insufficiency .  Hx of WHO group 1 pulmonary HTN in setting of scleroderma. Plan Continue pre-admit sildenafil and opsumit.  No not take nitroglycerin with sildenafil unless discussed with Dr. Haroldine Laws. Has follow-up appointment with Dr. Haroldine Laws and rheumatologist scheduled per wife.     Hx of CAD, HTN, HLD. Plan Resume preadmit lisinopril and torsemide Continue zocor Recommended low sodium/ heart healthy diet  Hx of gout. Plan Continue home allopurinol  Tobacco abuse. Plan Tobacco cessation counseling provided.   Anxiety, chronic pain. Plan Continue home tylenol, norco, xanax  Weight loss - 08/28/2018 58kg -> 50.6kg 09/17/2018 Plan Follow with primary care  [facility] tests/ studies  5/23 CTA abd/pelvis >> 1.  Atherosclerotic changes are seen. Some variant anatomy of the celiac axis is noted.  No aneurysmal dilatation or dissection is noted. 2.  Area of active arterial hemorrhage within the cecum as  described above. This may represent a small AVM given the patient's clinical history. No other areas of active extravasation are seen. Delayed venous images demonstrate continued acute hemorrhage.   3. Large pericardial effusion which appears stable from the prior Study.  Large right pleural  effusion.  Procedures   5/23 Embolization of cecal AVM by IR   Culture data/antimicrobials   COVID 5/23 >> negative    Consults  GI IR   Discharge Exam: BP (!) 142/67 (BP Location: Right Arm)    Pulse 72    Temp 98.4 F (36.9 C) (Oral)    Resp 18    Ht 5' 8.5" (1.74 m)    Wt 50.6 kg    SpO2 100%    BMI 16.71 kg/m   General:  Thin older [name] lying in bed in NAD HEENT: MM pink/moist, pupils 4/equal and reactive Neuro: Alert and oriented x 3, MAE CV: RR,  PULM: even/non-labored, lungs bilaterally clear  GI: soft, non-tender, non-distended, bs active  Extremities: warm/dry, +1 pedal edema  Skin: no rashes   Labs at discharge   Lab Results  Component Value Date   CREATININE 2.04 (H) 09/17/2018   BUN 50 (H) 09/17/2018   NA 142 09/17/2018   K 3.3 (L) 09/17/2018   CL 114 (H) 09/17/2018   CO2 19 (L) 09/17/2018   Lab Results  Component Value Date   WBC 4.5 09/17/2018   HGB 8.4 (L) 09/17/2018   HCT 24.4 (L) 09/17/2018   MCV 88.1 09/17/2018   PLT 85 (L) 09/17/2018   Lab Results  Component Value Date   ALT 9 09/15/2018   AST 11 (L) 09/15/2018   ALKPHOS 46 09/15/2018   BILITOT 0.7 09/15/2018   Lab Results  Component Value Date   INR 1.2 09/14/2018   INR 0.99 05/21/2017    Current radiological studies    No results found.  Disposition:    Discharge disposition: 01-Home or [facility] with wife, [name]   Allergies as of 09/17/2018   No Known Allergies     Medication List    STOP taking these medications   nitroGLYCERIN 0.4 MG SL tablet Commonly known as:  Nitrostat   pantoprazole 40 MG tablet Commonly known as:  PROTONIX     TAKE these medications   allopurinol 100 MG tablet Commonly known as:  ZYLOPRIM TAKE 1 TABLET BY MOUTH EVERY DAY   ALPRAZolam 0.5 MG tablet Commonly known as:  XANAX Take 0.5 mg by mouth daily as needed for anxiety.   B-12 2500 MCG Tabs Take 2,500 mcg by mouth daily.   colchicine 0.6 MG tablet Take 1 tablet  (0.6 mg total) by mouth daily as needed. What changed:  reasons to take this   fluticasone 50 MCG/ACT nasal spray Commonly known as:  FLONASE Place 2 sprays into both nostrils daily as needed for allergies.   HYDROcodone-acetaminophen 10-325 MG tablet Commonly known as:  NORCO Take 0.5 tablets by mouth 2 (two) times daily as needed for moderate pain.   lisinopril 40 MG tablet Commonly known as:  ZESTRIL Take 40 mg by mouth daily. Daily at night   macitentan 10 MG tablet Commonly known as:  Opsumit Take 1 tablet (10 mg total) by mouth daily.   sildenafil 20 MG tablet Commonly known as:  REVATIO Take 1 tablet (20 mg total) by mouth 3 (three) times daily.   simvastatin 40 MG tablet Commonly known as:  ZOCOR TAKE 1 TABLET BY MOUTH  DAILY AT 6PM What changed:  See the new instructions.   torsemide 20 MG tablet Commonly known as:  DEMADEX Take 1 tablet (20 mg total) by mouth daily.        Follow-up appointment   Wife, [name] has appointments set up with Dr. Haroldine Laws and rheumatology already.  GI said they would contact patient for further outpatient follow-up.  Advised to make appointment with patient's primary physican, Dr. [name] with [facility] for hospital follow-up in 1 week.    Discharge Condition:    stable   Kennieth Rad, MSN, AGACNP-BC [facility] Pgr: [phone] or if no answer [phone] 09/17/2018, 12:06 PM

## 2018-09-18 ENCOUNTER — Telehealth: Payer: Self-pay | Admitting: Gastroenterology

## 2018-09-18 NOTE — Telephone Encounter (Signed)
Patient may be scheduled for an in office visit with Dr. Bryan Lemma.  Please call patient

## 2018-09-18 NOTE — Telephone Encounter (Signed)
Either is ok with me, but if they are requesting in office, I am happy to see him in the [facility] office. Thanks!

## 2018-09-18 NOTE — Telephone Encounter (Signed)
Pt sched in offc 09/20/2018 @ 3:30pm

## 2018-09-18 NOTE — Telephone Encounter (Signed)
Pt wife called in wanting to set an appt. In offc with the provider. She stated that he had a GI bleed and was rushed to [facility] and had surgery and is needing an f/u visit in offc.

## 2018-09-18 NOTE — Telephone Encounter (Signed)
Do you want virtual or in office visit?

## 2018-09-20 ENCOUNTER — Other Ambulatory Visit: Payer: Self-pay

## 2018-09-20 ENCOUNTER — Ambulatory Visit: Payer: 59 | Admitting: Gastroenterology

## 2018-09-20 ENCOUNTER — Encounter: Payer: Self-pay | Admitting: Gastroenterology

## 2018-09-20 ENCOUNTER — Telehealth: Payer: Self-pay | Admitting: *Deleted

## 2018-09-20 VITALS — BP 138/80 | HR 88 | Temp 98.4°F | Ht 68.5 in | Wt 116.4 lb

## 2018-09-20 DIAGNOSIS — D126 Benign neoplasm of colon, unspecified: Secondary | ICD-10-CM

## 2018-09-20 DIAGNOSIS — I519 Heart disease, unspecified: Secondary | ICD-10-CM

## 2018-09-20 DIAGNOSIS — K2970 Gastritis, unspecified, without bleeding: Secondary | ICD-10-CM

## 2018-09-20 DIAGNOSIS — D62 Acute posthemorrhagic anemia: Secondary | ICD-10-CM

## 2018-09-20 DIAGNOSIS — K921 Melena: Secondary | ICD-10-CM

## 2018-09-20 DIAGNOSIS — I509 Heart failure, unspecified: Secondary | ICD-10-CM

## 2018-09-20 DIAGNOSIS — K5520 Angiodysplasia of colon without hemorrhage: Secondary | ICD-10-CM

## 2018-09-20 NOTE — Telephone Encounter (Signed)
LVMTCB

## 2018-09-20 NOTE — Progress Notes (Signed)
P  Chief Complaint:    Hospital follow-up, AVMs  GI History: 58 year old male with a history of CAD, CHF, pulmonary hypertension, pericardial effusion s/p pericardial window in 2010, CKD, Scleroderma, hyperlipidemia, hypertension, prior MI admitted 5/6-9 with duodenal and colonic AVMs, treated with APC and 2 units PRBCs.  There was a significant burden of AVMs in the cecum, to include active oozing.  Bleeding all treated with APC and discharged home without any antiplatelet therapy.  No anticoagulation.  Was unfortunately readmitted 5/23 for hemodynamically significant bleeding, requiring IR coil embolization of the cecal artery on 5/23 and 1 unit PRBCs.  Endoscopic history: -EGD (08/30/2018, Dr. Bryan Lemma): 4 duodenal AVMs treated with APC, polypoid cystic type lesion in third portion of the duodenum (biopsy benign), nodular area in the second portion of the duodenum (biopsy benign), benign lymphangiectasia in the small bowel, and diffuse, moderate gastritis (biopsy with benign fundic gland polyps, no H. pylori).  Recommended repeat upper endoscopy in 8 weeks. -Colonoscopy (08/30/2018, Dr. Bryan Lemma): multiple bleeding AVMs in the cecum, treated with APC, with additional AVMs in the ascending, descending, sigmoid colon, all treated with APC.  12 mm tubular adenoma resected from transverse colon.  Prep was poor, with recommendation repeat in 6 months   HPI:     Patient is a 58 y.o. male presenting to [facility] for hospital follow-up.  He was last seen by me on 09/04/2018 for follow-up from hospitalization earlier this month.  Was feeling well at that time, without any complaints.  Mr. Negron was unfortunately readmitted 5/23-26 for recurrence of bleeding from cecal AVMs, complicated by hypotension and admission hemoglobin 6.7.  Treated with IR coil embolization of the cecal artery on 5/23, with cessation of bleeding, 1 unit PRBC.  Hemodynamics normalized, hemoglobin stable, and  AKI on CKD improved back to baseline.  Hemoglobin 8.4 at discharge on 5/26.  Today states he is back to his usual state of health.  No overt GI blood loss since hospital discharge earlier this week.  Tolerating p.o. intake.  Not taking any NSAIDs and no aspirin or Plavix (cardiology recommended stopping aspirin at the time of hospital discharge earlier in the month).  No anticoagulation.  Takes Protonix as prescribed.  Offers no complaints today.  Review of systems:     No chest pain, no SOB, no fevers, no urinary sx   Past Medical History:  Diagnosis Date  . Anxiety   . CAD (coronary artery disease)   . Gout   . Hyperlipidemia   . Hypertension 05/14/2012    Lexiscan-- EF 51% ,LV normal  . Hypothyroid   . MI (myocardial infarction) ([place]) 2010  . Pericardial effusion 12/2008   Dr [name] performed a subxiphoid window removing 275m of fluid  . Pericardial effusion 03/09/2010   Echo-LVEF >55%, very small pericardial effusion ,,Stage 1 (impaired ) diastolic fxn, elevated LV filling  . Pericarditis   . Raynaud's phenomenon   . Scleroderma ([place]   . Smoker 09/16/2018   1 ppd  . Vitiligo     Patient's surgical history, family medical history, social history, medications and allergies were all reviewed in Epic    Current Outpatient Medications  Medication Sig Dispense Refill  . allopurinol (ZYLOPRIM) 100 MG tablet TAKE 1 TABLET BY MOUTH EVERY DAY (Patient taking differently: Take 100 mg by mouth daily. ) 30 tablet 2  . ALPRAZolam (XANAX) 0.5 MG tablet Take 0.5 mg by mouth daily as needed for anxiety.     . colchicine  0.6 MG tablet Take 1 tablet (0.6 mg total) by mouth daily as needed. (Patient taking differently: Take 0.6 mg by mouth daily as needed (gout flare). ) 30 tablet 2  . Cyanocobalamin (B-12) 2500 MCG TABS Take 2,500 mcg by mouth daily.    . fluticasone (FLONASE) 50 MCG/ACT nasal spray Place 2 sprays into both nostrils daily as needed for allergies.   11  .  HYDROcodone-acetaminophen (NORCO) 10-325 MG tablet Take 0.5 tablets by mouth 2 (two) times daily as needed for moderate pain.     . macitentan (OPSUMIT) 10 MG tablet Take 1 tablet (10 mg total) by mouth daily. 90 tablet 3  . sildenafil (REVATIO) 20 MG tablet Take 1 tablet (20 mg total) by mouth 3 (three) times daily. 90 tablet 12  . simvastatin (ZOCOR) 40 MG tablet TAKE 1 TABLET BY MOUTH DAILY AT 6PM (Patient taking differently: Take 40 mg by mouth daily. Daily in the morning) 30 tablet 3  . torsemide (DEMADEX) 20 MG tablet Take 1 tablet (20 mg total) by mouth daily. 30 tablet 0   No current facility-administered medications for this visit.     Physical Exam:     BP 138/80   Pulse 88   Temp 98.4 F (36.9 C)   Ht 5' 8.5" (1.74 m)   Wt 116 lb 6 oz (52.8 kg)   BMI 17.44 kg/m   GENERAL:  Pleasant male in NAD PSYCH: : Cooperative, normal affect EENT:  conjunctiva pink, mucous membranes moist, neck supple without masses CARDIAC:  RRR, no murmur heard, no peripheral edema PULM: Normal respiratory effort, lungs CTA bilaterally, no wheezing ABDOMEN:  Nondistended, soft, nontender. No obvious masses, no hepatomegaly,  normal bowel sounds SKIN:  turgor, no lesions seen Musculoskeletal:  Normal muscle tone, normal strength NEURO: Alert and oriented x 3, no focal neurologic deficits   IMPRESSION and PLAN:    #1.  AVMs: EGD/colonoscopy earlier this month with for duodenal AVMs and multiple colonic AVMs, all treated with APC.  Unfortunately readmitted with hemodynamically significant bleed last week, requiring IR coil embolization of the cecal artery with cessation of bleeding.  I had a long discussion with the patient and his wife today regarding the pathophysiology and natural course of AVMs.  We discussed serial endoscopic evaluations for therapeutic intent along with medication options to include hormone therapy (little role outside of HHT and ESRD), thalidomide (associated with venous  thrombosis), and octreotide.  Discussed ADRs associated with these medications and pros cons of medical management, which tends to be reserved for those with refractory/recurrent bleeding or inaccessible lesions.  Given the ADR profile of each of these agents with respect to his underlying comorbidities, they would like to hold off on introducing any of these at this juncture, which is a reasonable option.  Discussed serial H/H and iron checks with iron and/for PRBC transfusions to maintain adequate hemoglobin.  Additionally discussed VCE to interrogate for additional small bowel AVMs that may or may not be accessible to deep enteroscopy.  He would like to proceed as below:  - Start with VCE to evaluate for additional small bowel lesions.  If this is largely unrevealing, plan for push enteroscopy/colonoscopy with therapeutic intent - Per patient and his wife, would like to postpone any endoscopic evaluation (aside from VCE) until July due to family obligations - Check CBC and iron panel in 1 week, and survey monthly for now - Discussed return precautions -No longer taking any antiplatelet therapy.  No anticoagulants.  Does not  take any NSAIDs. -Any endoscopic exams will need to be done at [facility] given need for APC  2) Acute blood loss anemia: -Hemoglobin 8.4 at discharge.  No ongoing bleeding.  Baseline around 10 -Repeat CBC and iron panel in 1 week  3) CAD 4) CHF, cor pulmonale, pulmonary hypertension - Scheduled to follow-up with Dr. Haroldine Laws July 13th - Per patient and his wife, no plans for cardiac catheterization at this time  5) Non-H. pylori gastritis: - Continue high-dose PPI x8 weeks - Evaluate for appropriate mucosal healing at time of upper endoscopy as above - Evaluate for nodular appearing mucosa in the duodenum at the time of repeat upper endoscopy as well.  Biopsies of this area were benign - Provided appropriate mucosal healing, can decrease PPI and hopefully  titrate off, as he is otherwise without reflux, takes no NSAIDs, no plans for aspirin, etc.  6) Tubular Adenoma: -Colonoscopy with 12 mm pedunculated tubular adenoma.  Prep was otherwise poor and cannot rule out additional polyps -Repeat colonoscopy as above    RTC in 1-2 months or sooner as needed     The indications, risks, and benefits of Video Capsule Endoscopy were explained to the patient in detail. Risks include but are not limited to capsule retention requiring endoscopic or surgical retrieval (occurs in 1-2%), failure to reach cecum prior to capsule battery expiration (ie, incomplete study), or inadequate visualization (ie, non-diagnostic study). The patient verbalized understanding and wished to proceed. All questions answered, referred to scheduler. Further recommendations pending results of the exam.   I spent a total of 25 minutes of face-to-face time with the patient. Greater than 50% of the time was spent counseling and coordinating care.     Lavena Bullion ,DO, FACG 09/20/2018, 3:42 PM

## 2018-09-20 NOTE — Telephone Encounter (Signed)

## 2018-09-20 NOTE — Patient Instructions (Addendum)
If you are age 60 or older, your body mass index should be between 23-30. Your Body mass index is 17.44 kg/m. If this is out of the aforementioned range listed, please consider follow up with your Primary Care Provider.  If you are age 69 or younger, your body mass index should be between 19-25. Your Body mass index is 17.44 kg/m. If this is out of the aformentioned range listed, please consider follow up with your Primary Care Provider.   Your provider has requested that you go to the basement level for lab work in one week at [facility] in [place] ([place]). Press "B" on the elevator. The lab is located at the first door on the left as you exit the elevator.  You have been schedule for a video capsule endoscopy.   It was a pleasure to see you today!  Vito Cirigliano, D.O.

## 2018-09-23 ENCOUNTER — Telehealth: Payer: Self-pay | Admitting: Cardiovascular Disease

## 2018-09-23 ENCOUNTER — Other Ambulatory Visit: Payer: Self-pay

## 2018-09-23 DIAGNOSIS — K2970 Gastritis, unspecified, without bleeding: Secondary | ICD-10-CM

## 2018-09-23 DIAGNOSIS — K5520 Angiodysplasia of colon without hemorrhage: Secondary | ICD-10-CM

## 2018-09-23 DIAGNOSIS — D62 Acute posthemorrhagic anemia: Secondary | ICD-10-CM

## 2018-09-23 DIAGNOSIS — K5521 Angiodysplasia of colon with hemorrhage: Secondary | ICD-10-CM

## 2018-09-23 DIAGNOSIS — K921 Melena: Secondary | ICD-10-CM

## 2018-09-23 DIAGNOSIS — K2990 Gastroduodenitis, unspecified, without bleeding: Secondary | ICD-10-CM

## 2018-09-23 DIAGNOSIS — K625 Hemorrhage of anus and rectum: Secondary | ICD-10-CM

## 2018-09-23 DIAGNOSIS — D126 Benign neoplasm of colon, unspecified: Secondary | ICD-10-CM

## 2018-09-23 NOTE — Telephone Encounter (Signed)
wife's smartphone/ my chart declined/ consent/ pre reg completed

## 2018-09-25 ENCOUNTER — Telehealth (INDEPENDENT_AMBULATORY_CARE_PROVIDER_SITE_OTHER): Payer: 59 | Admitting: Cardiovascular Disease

## 2018-09-25 ENCOUNTER — Telehealth: Payer: Self-pay

## 2018-09-25 DIAGNOSIS — M349 Systemic sclerosis, unspecified: Secondary | ICD-10-CM

## 2018-09-25 DIAGNOSIS — I519 Heart disease, unspecified: Secondary | ICD-10-CM

## 2018-09-25 DIAGNOSIS — I5022 Chronic systolic (congestive) heart failure: Secondary | ICD-10-CM

## 2018-09-25 DIAGNOSIS — I313 Pericardial effusion (noninflammatory): Secondary | ICD-10-CM

## 2018-09-25 DIAGNOSIS — Z9861 Coronary angioplasty status: Secondary | ICD-10-CM

## 2018-09-25 DIAGNOSIS — I10 Essential (primary) hypertension: Secondary | ICD-10-CM | POA: Diagnosis not present

## 2018-09-25 DIAGNOSIS — F17200 Nicotine dependence, unspecified, uncomplicated: Secondary | ICD-10-CM

## 2018-09-25 DIAGNOSIS — I3139 Other pericardial effusion (noninflammatory): Secondary | ICD-10-CM

## 2018-09-25 DIAGNOSIS — I2510 Atherosclerotic heart disease of native coronary artery without angina pectoris: Secondary | ICD-10-CM

## 2018-09-25 DIAGNOSIS — E782 Mixed hyperlipidemia: Secondary | ICD-10-CM | POA: Diagnosis not present
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
## 2018-09-25 NOTE — Telephone Encounter (Signed)
Left detailed message regarding pt 6/3 AVS instructions on pt VM per DPR.  Letter including After Visit Summary and any other necessary documents to be mailed to the patient's address on file.

## 2018-09-25 NOTE — Patient Instructions (Signed)
Medication Instructions:  Your physician recommends that you continue on your current medications as directed. Please refer to the Current Medication list given to you today.  If you need a refill on your cardiac medications before your next appointment, please call your pharmacy.   Lab work: NONE If you have labs (blood work) drawn today and your tests are completely normal, you will receive your results only by: [name] MyChart Message (if you have MyChart) OR . A paper copy in the mail If you have any lab test that is abnormal or we need to change your treatment, we will call you to review the results.  Testing/Procedures: NONE  Follow-Up: At [facility], you and your health needs are our priority.  As part of our continuing mission to provide you with exceptional heart care, we have created designated Provider Care Teams.  These Care Teams include your primary Cardiologist (physician) and Advanced Practice Providers (APPs -  Physician Assistants and Nurse Practitioners) who all work together to provide you with the care you need, when you need it. You will need a follow up appointment in 12 months WITH DR. [name].  Please call our office 2 months in advance to schedule this appointment.

## 2018-09-27 ENCOUNTER — Other Ambulatory Visit (INDEPENDENT_AMBULATORY_CARE_PROVIDER_SITE_OTHER): Payer: 59

## 2018-09-27 DIAGNOSIS — K5520 Angiodysplasia of colon without hemorrhage: Secondary | ICD-10-CM

## 2018-09-27 DIAGNOSIS — K921 Melena: Secondary | ICD-10-CM | POA: Diagnosis not present

## 2018-09-27 LAB — CBC WITH DIFFERENTIAL/PLATELET
Basophils Absolute: 0 10*3/uL (ref 0.0–0.1)
Basophils Relative: 0.6 % (ref 0.0–3.0)
Eosinophils Absolute: 0.1 10*3/uL (ref 0.0–0.7)
Eosinophils Relative: 2.1 % (ref 0.0–5.0)
HCT: 26.7 % — ABNORMAL LOW (ref 39.0–52.0)
Hemoglobin: 9.3 g/dL — ABNORMAL LOW (ref 13.0–17.0)
Lymphocytes Relative: 15.7 % (ref 12.0–46.0)
Lymphs Abs: 1 10*3/uL (ref 0.7–4.0)
MCHC: 34.7 g/dL (ref 30.0–36.0)
MCV: 92.2 fl (ref 78.0–100.0)
Monocytes Absolute: 0.6 10*3/uL (ref 0.1–1.0)
Monocytes Relative: 9.5 % (ref 3.0–12.0)
Neutro Abs: 4.7 10*3/uL (ref 1.4–7.7)
Neutrophils Relative %: 72.1 % (ref 43.0–77.0)
Platelets: 199 10*3/uL (ref 150.0–400.0)
RBC: 2.9 Mil/uL — ABNORMAL LOW (ref 4.22–5.81)
RDW: 16.4 % — ABNORMAL HIGH (ref 11.5–15.5)
WBC: 6.6 10*3/uL (ref 4.0–10.5)

## 2018-09-27 LAB — FOLATE: Folate: 13.7 ng/mL (ref >5.9)

## 2018-09-27 LAB — FERRITIN: Ferritin: 146.7 ng/mL (ref 22.0–322.0)

## 2018-09-27 LAB — VITAMIN B12: Vitamin B-12: 1080 pg/mL — ABNORMAL HIGH (ref 211–911)

## 2018-09-28 LAB — IRON, TOTAL/TOTAL IRON BINDING CAP
%SAT: 21 % (calc) (ref 20–48)
Iron: 47 ug/dL — ABNORMAL LOW (ref 50–180)
TIBC: 222 mcg/dL (calc) — ABNORMAL LOW (ref 250–425)

## 2018-10-02 ENCOUNTER — Other Ambulatory Visit: Payer: Self-pay

## 2018-10-02 ENCOUNTER — Encounter: Payer: Self-pay | Admitting: Gastroenterology

## 2018-10-02 ENCOUNTER — Telehealth (INDEPENDENT_AMBULATORY_CARE_PROVIDER_SITE_OTHER): Payer: 59 | Admitting: Gastroenterology

## 2018-10-02 VITALS — Ht 68.5 in | Wt 114.0 lb

## 2018-10-02 DIAGNOSIS — K5521 Angiodysplasia of colon with hemorrhage: Secondary | ICD-10-CM

## 2018-10-02 DIAGNOSIS — I509 Heart failure, unspecified: Secondary | ICD-10-CM

## 2018-10-02 DIAGNOSIS — I2510 Atherosclerotic heart disease of native coronary artery without angina pectoris: Secondary | ICD-10-CM

## 2018-10-02 DIAGNOSIS — D126 Benign neoplasm of colon, unspecified: Secondary | ICD-10-CM

## 2018-10-02 DIAGNOSIS — I519 Heart disease, unspecified: Secondary | ICD-10-CM

## 2018-10-02 DIAGNOSIS — I5022 Chronic systolic (congestive) heart failure: Secondary | ICD-10-CM | POA: Diagnosis not present

## 2018-10-02 DIAGNOSIS — K5520 Angiodysplasia of colon without hemorrhage: Secondary | ICD-10-CM

## 2018-10-02 DIAGNOSIS — D62 Acute posthemorrhagic anemia: Secondary | ICD-10-CM

## 2018-10-02 DIAGNOSIS — Z9861 Coronary angioplasty status: Secondary | ICD-10-CM

## 2018-10-02 DIAGNOSIS — K2970 Gastritis, unspecified, without bleeding: Secondary | ICD-10-CM

## 2018-10-02 DIAGNOSIS — K2990 Gastroduodenitis, unspecified, without bleeding: Secondary | ICD-10-CM

## 2018-10-02 NOTE — Patient Instructions (Addendum)
If you are age 60 or older, your body mass index should be between 23-30. Your Body mass index is 17.08 kg/m. If this is out of the aforementioned range listed, please consider follow up with your Primary Care Provider.  If you are age 2 or younger, your body mass index should be between 19-25. Your Body mass index is 17.08 kg/m. If this is out of the aformentioned range listed, please consider follow up with your Primary Care Provider.   To help prevent the possible spread of infection to our patients, communities, and staff; we will be implementing the following measures:  As of now we are not allowing any visitors/family members to accompany you to any upcoming appointments with [facility]. If you have any concerns about this please contact our office to discuss prior to the appointment.   Your provider has requested that in 1 months time you go to the basement level for lab work at our [place] location ([address] [place] [address]) . Press "B" on the elevator. The lab is located at the first door on the left as you exit the elevator. You may go at whatever time is convienent for you. The current hours of operations are Monday- Friday 7:30am-4:30pm.  Please call our office at [phone] to set up your 2 month follow up visit.  It was a pleasure to see you today!  Vito Cirigliano, D.O.
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
## 2018-10-09 ENCOUNTER — Ambulatory Visit (INDEPENDENT_AMBULATORY_CARE_PROVIDER_SITE_OTHER): Payer: 59 | Admitting: Gastroenterology

## 2018-10-09 ENCOUNTER — Other Ambulatory Visit: Payer: Self-pay

## 2018-10-09 DIAGNOSIS — K5520 Angiodysplasia of colon without hemorrhage: Secondary | ICD-10-CM | POA: Diagnosis not present

## 2018-10-09 NOTE — Progress Notes (Signed)
LOT: [id number] SN: [id number] EXP: 2020-03-11  Patient tolerated swallowing capsule well. Retrieval instruction booklet given and visualized teaching instructions by RN. Patient verbalized understanding.
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
## 2018-10-24 ENCOUNTER — Telehealth (HOSPITAL_COMMUNITY): Payer: Self-pay | Admitting: Internal Medicine

## 2018-10-24 NOTE — Telephone Encounter (Signed)
COVID screening completed.  PT will have support person with him.  -[facility]

## 2018-10-29 ENCOUNTER — Other Ambulatory Visit: Payer: Self-pay | Admitting: Rheumatology

## 2018-10-29 ENCOUNTER — Ambulatory Visit (HOSPITAL_COMMUNITY)
Admission: RE | Admit: 2018-10-29 | Discharge: 2018-10-29 | Disposition: A | Discharge status: Home / Self Care | Payer: 59 | Source: Ambulatory Visit | Attending: Internal Medicine | Admitting: Internal Medicine

## 2018-10-29 ENCOUNTER — Telehealth: Payer: Self-pay | Admitting: Internal Medicine

## 2018-10-29 ENCOUNTER — Other Ambulatory Visit: Payer: Self-pay

## 2018-10-29 VITALS — BP 150/88 | HR 84 | Wt 114.4 lb

## 2018-10-29 DIAGNOSIS — Z955 Presence of coronary angioplasty implant and graft: Secondary | ICD-10-CM | POA: Insufficient documentation

## 2018-10-29 DIAGNOSIS — Z79899 Other long term (current) drug therapy: Secondary | ICD-10-CM | POA: Diagnosis not present

## 2018-10-29 DIAGNOSIS — I10 Essential (primary) hypertension: Secondary | ICD-10-CM | POA: Insufficient documentation

## 2018-10-29 DIAGNOSIS — M349 Systemic sclerosis, unspecified: Secondary | ICD-10-CM | POA: Insufficient documentation

## 2018-10-29 DIAGNOSIS — I252 Old myocardial infarction: Secondary | ICD-10-CM | POA: Insufficient documentation

## 2018-10-29 DIAGNOSIS — I2510 Atherosclerotic heart disease of native coronary artery without angina pectoris: Secondary | ICD-10-CM | POA: Diagnosis not present

## 2018-10-29 DIAGNOSIS — F419 Anxiety disorder, unspecified: Secondary | ICD-10-CM | POA: Insufficient documentation

## 2018-10-29 DIAGNOSIS — M109 Gout, unspecified: Secondary | ICD-10-CM | POA: Diagnosis not present

## 2018-10-29 DIAGNOSIS — E785 Hyperlipidemia, unspecified: Secondary | ICD-10-CM | POA: Insufficient documentation

## 2018-10-29 DIAGNOSIS — I5022 Chronic systolic (congestive) heart failure: Secondary | ICD-10-CM | POA: Diagnosis not present

## 2018-10-29 DIAGNOSIS — I272 Pulmonary hypertension, unspecified: Secondary | ICD-10-CM | POA: Diagnosis not present

## 2018-10-29 DIAGNOSIS — K922 Gastrointestinal hemorrhage, unspecified: Secondary | ICD-10-CM | POA: Insufficient documentation

## 2018-10-29 DIAGNOSIS — Z809 Family history of malignant neoplasm, unspecified: Secondary | ICD-10-CM | POA: Diagnosis not present

## 2018-10-29 DIAGNOSIS — D649 Anemia, unspecified: Secondary | ICD-10-CM | POA: Insufficient documentation

## 2018-10-29 DIAGNOSIS — F17210 Nicotine dependence, cigarettes, uncomplicated: Secondary | ICD-10-CM | POA: Insufficient documentation

## 2018-10-29 DIAGNOSIS — F17200 Nicotine dependence, unspecified, uncomplicated: Secondary | ICD-10-CM

## 2018-10-29 DIAGNOSIS — I2781 Cor pulmonale (chronic): Secondary | ICD-10-CM | POA: Diagnosis not present

## 2018-10-29 DIAGNOSIS — I313 Pericardial effusion (noninflammatory): Secondary | ICD-10-CM | POA: Insufficient documentation

## 2018-10-29 LAB — CBC
HCT: 29.8 % — ABNORMAL LOW (ref 39.0–52.0)
Hemoglobin: 9.3 g/dL — ABNORMAL LOW (ref 13.0–17.0)
MCH: 31.6 pg (ref 26.0–34.0)
MCHC: 31.2 g/dL (ref 30.0–36.0)
MCV: 101.4 fL — ABNORMAL HIGH (ref 80.0–100.0)
Platelets: 181 10*3/uL (ref 150–400)
RBC: 2.94 MIL/uL — ABNORMAL LOW (ref 4.22–5.81)
RDW: 17.9 % — ABNORMAL HIGH (ref 11.5–15.5)
WBC: 6.2 10*3/uL (ref 4.0–10.5)
nRBC: 0 % (ref 0.0–0.2)

## 2018-10-29 LAB — BASIC METABOLIC PANEL
Anion gap: 10 (ref 5–15)
BUN: 49 mg/dL — ABNORMAL HIGH (ref 6–20)
CO2: 23 mmol/L (ref 22–32)
Calcium: 8.9 mg/dL (ref 8.9–10.3)
Chloride: 110 mmol/L (ref 98–111)
Creatinine, Ser: 1.66 mg/dL — ABNORMAL HIGH (ref 0.61–1.24)
GFR calc Af Amer: 51 mL/min — ABNORMAL LOW (ref >60)
GFR calc non Af Amer: 44 mL/min — ABNORMAL LOW (ref >60)
Glucose, Bld: 93 mg/dL (ref 70–99)
Potassium: 3.5 mmol/L (ref 3.5–5.1)
Sodium: 143 mmol/L (ref 135–145)

## 2018-10-29 NOTE — Telephone Encounter (Signed)
Last Visit: 06/24/18  Next Visit: 11/01/18 Labs: 09/27/18 RBC 2.90, Hgb 9.3, Hct 26.7, RDW 16.4, Potassium 3.3, Chloride 114, CO2 19, BUN 50, Creat. 2.04, GFR 40 Calcium 7.9  Okay to refill Allopruinol?

## 2018-10-29 NOTE — Telephone Encounter (Signed)
Labs are stable. Ok to refill allopurinol.

## 2018-10-29 NOTE — Progress Notes (Signed)
ADVANCED HF CLINIC NOTE  Referring Physician: [name]  Primary Care: [name]: [name] [name]  HPI:   [name] is a 58 y.o.male with h/o scleroderma (diagnosed early 2000s), tobacco abuse, HTN, CAD s/p MI (s/p dRCA stent 2010) and previous pericardial effusion s/p pericardial window in 2010 by Dr. [name] who is referred by Dr. [name] due to recurrent pericardial effusion.   2-D [id number] normal LV systolic function, grade 2 diastolic dysfunction with moderate pericardial effusion without evidence of pericardial tamponade.  He was seen in [facility] office on 01/15/2018 due to increasing shortness of breath. 2D echo showed normal LV systolic function with small nonhemodynamically significant pericardial effusion unchanged from last echo. Myoview stress test was normal as well. His hydrochlorthiazide was changed to torsemide.   He saw Dr. [name] in February with worsening SOB and echo done 06/13/18 showed LVEF 40-45% (I felt EF 50-55%)with severe RV dysfunction, RVSP > 191mHG and large pericardial effusion. His diuretics were increased. He has subsequently seen Dr. HRoxan Hockeywho felt his effusion was likely related to his scleroderma and felt he would benefit from medical therapy prior to repeat surgical pericardia drainage which would require VATS.   CXR 06/11/18: Bilateral pleural effusions. No mass  Seen in HF clinic for first time 07/01/18[facility]. Started on sildenafil and opsumit.  In early May found to have hgb 4.6 Got 2u RBC. Endoscopy showed gastritis, congestive gastropathy, duodenal polyp biopsied, duodenal mucosa lymphangiectasia. Colonoscopy; 12 mm polyp removed from transverse colon, multiples bleeding colonicangioectasistreated with argon plasma coagulation.   Readmitted 2 weeks later with SBP 60s, Initial Hgb 6.7 with additional AKI with sCr of 2.51Seen on CTA to have findings concerning  for active hemorrhage, possibly due to previously noted atrial venous malformations.  Gastroenterology and Radiology IR were consulted.  Patient underwent coil embolization of the cecal artery on 5/23 in IR.  Patient returned to ICU for monitoring.  He had no further hematochezia and Hgb counts remained stable.  Subsequently saw GI as outpatient. Had capsule endo 2 weeks ago. Awaiting results  Here for routine f/u. Doing much better. Breathing better. No orthopnea or PND. Weight still down. No dark stools. No edema, PND. No syncope, presyncope. Edema well controlled.   R/LHC 07/01/18  Prox RCA to Mid RCA lesion is 40% stenosed.  Mid RCA lesion is 40% stenosed.  Previously placed Mid RCA to Dist RCA stent (unknown type) is widely patent.  Acute Mrg lesion is 95% stenosed.  Prox LAD lesion is 30% stenosed.  Mid LAD to Dist LAD lesion is 40% stenosed. Findings: Ao = 140/77 (100) LV = 145/14 RA = 16 RV = 79/18 PA = 81/26 (48) PCW = 17 Fick cardiac output/index = 4.6/2.7 PVR = 6.0 WU Ao sat = 97% PA sat = 61%, 64% No RV-LV interaction on simultaneous pressure waveforms with deep breathing Assessment: 1. Non-obstructive CAD with patent RCA stent 2. Moderate to severe PAH 3. No evidence of tamponade   Past Medical History:  Diagnosis Date  . Anxiety   . CAD (coronary artery disease)   . Gout   . Hyperlipidemia   . Hypertension 05/14/2012    Lexiscan-- EF 51% ,LV normal  . Hypothyroid   . [place]  . Pericardial effusion 12/2008   Dr HRoxan Hockeyperformed a subxiphoid window removing 2032mof fluid  . Pericardial effusion 03/09/2010   Echo-LVEF >55%, very small pericardial effusion ,,Stage  1 (impaired ) diastolic fxn, elevated LV filling  . Pericarditis   . Raynaud's phenomenon   . Scleroderma ([place])   . Smoker 09/16/2018   1 ppd  . Vitiligo     Current Outpatient Medications  Medication Sig Dispense Refill  . allopurinol (ZYLOPRIM) 100 MG  tablet TAKE 1 TABLET BY MOUTH EVERY DAY (Patient taking differently: Take 100 mg by mouth daily. ) 30 tablet 2  . ALPRAZolam (XANAX) 0.5 MG tablet Take 0.5 mg by mouth daily as needed for anxiety.     . colchicine 0.6 MG tablet Take 1 tablet (0.6 mg total) by mouth daily as needed. (Patient taking differently: Take 0.6 mg by mouth daily as needed (gout flare). ) 30 tablet 2  . Cyanocobalamin (B-12) 2500 MCG TABS Take 2,500 mcg by mouth daily.    . fluticasone (FLONASE) 50 MCG/ACT nasal spray Place 2 sprays into both nostrils daily as needed for allergies.   11  . HYDROcodone-acetaminophen (NORCO) 10-325 MG tablet Take 0.5 tablets by mouth 2 (two) times daily as needed for moderate pain.     . macitentan (OPSUMIT) 10 MG tablet Take 1 tablet (10 mg total) by mouth daily. 90 tablet 3  . sildenafil (REVATIO) 20 MG tablet Take 1 tablet (20 mg total) by mouth 3 (three) times daily. 90 tablet 12  . simvastatin (ZOCOR) 40 MG tablet TAKE 1 TABLET BY MOUTH DAILY AT 6PM (Patient taking differently: Take 40 mg by mouth daily. Daily in the morning) 30 tablet 3   No current facility-administered medications for this encounter.     No Known Allergies    Social History   Socioeconomic History  . Marital status: Married    Spouse name: [name]  . Number of children: 6  . Years of education: 37  . Highest education level: Not on file  Occupational History  . Occupation: Development worker, community  Social Needs  . Financial resource strain: Somewhat hard  . Food insecurity    Worry: Never true    Inability: Sometimes true  . Transportation needs    Medical: No    Non-medical: No  Tobacco Use  . Smoking status: Current Every Day Smoker    Packs/day: 1.00    Years: 38.00    Pack years: 38.00    Types: Cigarettes    Start date: 10/21/1971  . Smokeless tobacco: Never Used  Substance and Sexual Activity  . Alcohol use: Not Currently    Comment: his drinking has decreased a lot   . Drug use: No  .  Sexual activity: Yes    Partners: Female  Lifestyle  . Physical activity    Days per week: Patient refused    Minutes per session: Patient refused  . Stress: Not on file  Relationships  . Social Herbalist on phone: Patient refused    Gets together: Patient refused    Attends religious service: Patient refused    Active member of club or organization: Patient refused    Attends meetings of clubs or organizations: Patient refused    Relationship status: Patient refused  . Intimate partner violence    Fear of current or ex partner: No    Emotionally abused: No    Physically abused: No    Forced sexual activity: No  Other Topics Concern  . Not on file  Social History Narrative   Lives with his wife and one daughter.      Family History  Problem Relation Age  of Onset  . Lupus Mother   . Cancer Mother        unknown per wife  . Kidney failure Father   . Autoimmune disease Sister   . Colon cancer Neg Hx     Vitals:   10/29/18 1113  BP: (!) 150/88  Pulse: 84  SpO2: 97%  Weight: 51.9 kg (114 lb 6.4 oz)    Wt Readings from Last 3 Encounters:  10/29/18 51.9 kg (114 lb 6.4 oz)  10/02/18 51.7 kg (114 lb)  09/20/18 52.8 kg (116 lb 6 oz)    PHYSICAL EXAM: General:Thin, weak appearing. No resp difficulty. HEENT: normal Neck: supple. JVP 7-8. Carotids 2+ bilat; no bruits. No lymphadenopathy or thryomegaly appreciated. Cor: PMI nondisplaced. Regular rate & rhythm 2/6 TR loud P2 Lungs: clear Abdomen: soft, nontender, nondistended. No hepatosplenomegaly. No bruits or masses. Good bowel sounds. Extremities: no cyanosis, clubbing, rash, tr edema diffuse scleroderma changes Neuro: alert & orientedx3, cranial nerves grossly intact. moves all 4 extremities w/o difficulty. Affect pleasant   ASSESSMENT & PLAN:  1. Pulmonary HTN with recurrent large pericardial effusion - PAH (WHO Group I) - R/LHC 07/01/18: mod to severe PAH, no evidence of tamponade. PA 81/26 (48), PCW  17, PVC 6.0 - Hard to follow O2 sats with peripheral changes but will need to ensure adequate oxygenation - Continue sildenafil 20 mg TID - Continue opsumit 10 mg daily - Repeat echo to decide on need for selexipag  2. 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 NYHA II-III - Volume status ok. Continue torsemide - Continue spiro 25 mg daily  3. CAD - s/p previous RCA stent.  - LHC 07/01/18: Non obstructive CAD with patent RCA stent. - No s/s ischemia - Off ASA with GIB. Continue statin  4. Scleroderma - Follows with Dr. Estanislado Pandy   5. Tobacco use - smoking 3/4 ppd. Encouraged cessation. No change.  6. 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 - await results of capsule - check CBC  Glori Bickers, MD  11:15 AM

## 2018-10-29 NOTE — Patient Instructions (Signed)
Labs were done today. We will call you with any ABNORMAL results. No news is good news!  Your physician has requested that you have an echocardiogram. Echocardiography is a painless test that uses sound waves to create images of your heart. It provides your doctor with information about the size and shape of your heart and how well your heart's chambers and valves are working. This procedure takes approximately one hour. There are no restrictions for this procedure.  Your physician wants you to follow-up in: 3-4 months. You will receive a reminder letter in the mail two months in advance. If you don't receive a letter, please call our office to schedule the follow-up appointment.  At the [facility], you and your health needs are our priority. As part of our continuing mission to provide you with exceptional heart care, we have created designated Provider Care Teams. These Care Teams include your primary Cardiologist (physician) and Advanced Practice Providers (APPs- Physician Assistants and Nurse Practitioners) who all work together to provide you with the care you need, when you need it.   You may see any of the following providers on your designated Care Team at your next follow up: [name] Dr Glori Bickers . Dr Loralie Champagne . Darrick Grinder, NP

## 2018-10-29 NOTE — Telephone Encounter (Signed)
Patient and wife called bc he developed right lower abd pain tonight after dinner. Feels constipated Pain is cramping No blood in stool or melena No fever, chills Ate hot dog for lunch and spaghetti for dinner, started after dinner. Comes and goes He think feels like "gas" but severe. Did not have pain during recently episodes of GI bleeding from known AVMs. Coil embolization to cecal artery in May for refractory bleeding. Went to ER but long wait and pain had improved, so he left.  Plan: Back to ER tonight if pain returns and is severe, for any bleeding. OTC Gas-ex  Will alert Dr. Bryan Lemma who can contact patient tomorrow and plan additional eval and treatment if pain persists.

## 2018-10-30 ENCOUNTER — Encounter: Payer: Self-pay | Admitting: Gastroenterology

## 2018-10-30 ENCOUNTER — Ambulatory Visit (INDEPENDENT_AMBULATORY_CARE_PROVIDER_SITE_OTHER): Payer: 59 | Admitting: Gastroenterology

## 2018-10-30 ENCOUNTER — Other Ambulatory Visit (INDEPENDENT_AMBULATORY_CARE_PROVIDER_SITE_OTHER): Payer: 59

## 2018-10-30 VITALS — BP 138/78 | HR 68 | Ht 68.5 in | Wt 113.0 lb

## 2018-10-30 DIAGNOSIS — R1011 Right upper quadrant pain: Secondary | ICD-10-CM

## 2018-10-30 LAB — HEPATIC FUNCTION PANEL
ALT: 579 U/L — ABNORMAL HIGH (ref 0–53)
AST: 1097 U/L — ABNORMAL HIGH (ref 0–37)
Albumin: 3.8 g/dL (ref 3.5–5.2)
Alkaline Phosphatase: 692 U/L — ABNORMAL HIGH (ref 39–117)
Bilirubin, Direct: 1.1 mg/dL — ABNORMAL HIGH (ref 0.0–0.3)
Total Bilirubin: 1.6 mg/dL — ABNORMAL HIGH (ref 0.2–1.2)
Total Protein: 7.1 g/dL (ref 6.0–8.3)

## 2018-10-30 NOTE — Telephone Encounter (Signed)
Patient is returning your call said that the patient is still in pain CB# [phone]

## 2018-10-30 NOTE — Telephone Encounter (Signed)
Left message for patient to call back to the office at [phone]; Will attempt to reach patient at a later date/time;

## 2018-10-30 NOTE — Patient Instructions (Signed)
Your provider has requested that you go to the basement level for lab work before leaving today. Press "B" on the elevator. The lab is located at the first door on the left as you exit the elevator.  You can start over the counter Colace 100 mg twice daily and simethicone as needed for gas and bloating.   Contact our office if your pain returns.

## 2018-10-30 NOTE — Telephone Encounter (Signed)
Called and spoke with patient's wife-DPR-Sharon-wife instructed that patient could be seen in the office today by MD at 2:00pm; patient's wife is agreeable with plan of care and will have the patient here to be seen then; wife was advised to call back should questions/concerns arise;

## 2018-10-30 NOTE — Progress Notes (Addendum)
P  Chief Complaint:    Abdominal pain  GI History: 58 year old malewith a history of CAD, CHF, pulmonary hypertension,pericardial effusions/ppericardial window in 2010, CKD, Scleroderma, hyperlipidemia, hypertension, prior MIadmitted 5/6-9 with duodenal and colonic AVMs, treated with APCand 2 units PRBCs.There was a significant burden of AVMs in the cecum, to include active oozing. Bleeding all treated with APC and discharged home without any antiplatelet therapy. No anticoagulation. Was unfortunately readmitted 5/23 for hemodynamically significant bleeding, requiring IR coil embolization of the cecal artery on 5/23 and 1 unit PRBCs.  Endoscopic history: -EGD (08/30/2018, Dr. Bryan Lemma): 4 duodenal AVMs treated with APC,polypoid cystic type lesion in third portion of the duodenum (biopsy benign), nodular area in the second portion of the duodenum (biopsybenign), benign lymphangiectasia in the small bowel, and diffuse, moderate gastritis (biopsywith benign fundic gland polyps, no H. pylori). Recommended repeat upper endoscopy in 8 weeks. -Colonoscopy (08/30/2018, Dr. Kennis Buell):multiple bleeding AVMs in the cecum, treated with APC, with additional AVMs in the ascending, descending, sigmoid colon, all treated with APC. 12 mm tubular adenoma resected from transverse colon. Prep was poor, with recommendation repeat in 6 months - VCE (09/2018, Dr. Bryan Lemma): AVMs noted in the proximal small bowel, starting at 00:27:42, and mid small intestine, located at 02:10, 02:32, 02:57 and 04:03.  Capsule did not transit into the cecum before end of study.  HPI:     Patient is a 58 y.o. male presenting to the [facility]ology Clinic for evaluation of right RUQ pain.  Endorses abrupt onset RUQ pain yesterday after eating hotdogs for lunch and spaghetti with [place] fries for dinner.  Pain started when after after eating, with radiation to the back.  Pain lasted a couple of hours, remitted, then  returned.  He went to the ER, but left due to prolonged wait times.  Pain recurred again this morning after taking medications (no food), but less intense.  He describes as a "gas-like" pain.  Tried Gas-X this morning, but vomited shortly after.  Otherwise had been without nausea/vomiting.  No associated fever, chills, CP, SOB, DOE.  No prior similar symptoms.  No associated hematochezia, melena.  He is now completely pain-free and without any symptoms.  Tolerated small amounts of bland food at lunchtime.  Feels back to his baseline now.  Was seen in the cardiology clinic yesterday.  Planning on repeat TTE.  No new medications.  Routine labs completed yesterday prior to symptom onset, and n/f nornal WBC, stable H/H at 9.3/29.8, with elevated MCV at 101.4 (previously 80's/low 90's). BMP unchanged from prior (has CKD with improved creat at 1.66; o/w normal lytes).  Completed VCE last month which was notable for small bowel AVMs in the proximal and mid/distal small bowel.  Capsule did not advance into the cecum prior to termination of the study.  Patient and his wife both state that it took a couple days to retrieve the capsule.   He does state that he has had decreased bowel movements lately.  Does not drink water, just some soda throughout the day.  Has not trialed any stool softeners, laxatives, etc.  Review of systems:     No chest pain, no SOB, no fevers, no urinary sx   Past Medical History:  Diagnosis Date  . Anxiety   . CAD (coronary artery disease)   . Gout   . Hyperlipidemia   . Hypertension 05/14/2012    Lexiscan-- EF 51% ,LV normal  . Hypothyroid   . MI (myocardial infarction) ([facility]) 2010  .  Pericardial effusion 12/2008   Dr [name] performed a subxiphoid window removing 250m of fluid  . Pericardial effusion 03/09/2010   Echo-LVEF >55%, very small pericardial effusion ,,Stage 1 (impaired ) diastolic fxn, elevated LV filling  . Pericarditis   . Raynaud's phenomenon   .  [place]   . Smoker 09/16/2018   1 ppd  . Vitiligo     Patient's surgical history, family medical history, social history, medications and allergies were all reviewed in Epic    Current Outpatient Medications  Medication Sig Dispense Refill  . allopurinol (ZYLOPRIM) 100 MG tablet TAKE 1 TABLET BY MOUTH EVERY DAY 30 tablet 2  . ALPRAZolam (XANAX) 0.5 MG tablet Take 0.5 mg by mouth daily as needed for anxiety.     . colchicine 0.6 MG tablet Take 1 tablet (0.6 mg total) by mouth daily as needed. (Patient taking differently: Take 0.6 mg by mouth daily as needed (gout flare). ) 30 tablet 2  . Cyanocobalamin (B-12) 2500 MCG TABS Take 2,500 mcg by mouth daily.    . fluticasone (FLONASE) 50 MCG/ACT nasal spray Place 2 sprays into both nostrils daily as needed for allergies.   11  . HYDROcodone-acetaminophen (NORCO) 10-325 MG tablet Take 0.5 tablets by mouth 2 (two) times daily as needed for moderate pain.     . macitentan (OPSUMIT) 10 MG tablet Take 1 tablet (10 mg total) by mouth daily. 90 tablet 3  . sildenafil (REVATIO) 20 MG tablet Take 1 tablet (20 mg total) by mouth 3 (three) times daily. 90 tablet 12  . simvastatin (ZOCOR) 40 MG tablet TAKE 1 TABLET BY MOUTH DAILY AT 6PM (Patient taking differently: Take 40 mg by mouth daily. Daily in the morning) 30 tablet 3   No current facility-administered medications for this visit.     Physical Exam:     BP 138/78   Pulse 68   Ht 5' 8.5" (1.74 m)   Wt 113 lb (51.3 kg)   BMI 16.93 kg/m   GENERAL:  Pleasant male in NAD PSYCH: : Cooperative, normal affect EENT:  conjunctiva pink, mucous membranes moist, neck supple without masses ABDOMEN:  Nondistended, soft, nontender. No obvious masses, no hepatomegaly,  normal bowel sounds SKIN:  turgor, no lesions seen Musculoskeletal:  Normal muscle tone, normal strength NEURO: Alert and oriented x 3, no focal neurologic deficits   IMPRESSION and PLAN:    1) AVMs: EGD/colonoscopy 08/2018  with duodenal AVMs and multiple colonic AVMs, all treated with APC. Unfortunately readmitted with hemodynamically significant bleed 5/23, requiring IR coil embolization of the cecal artery with cessation of bleeding.  No recurrent bleeding since then.  VCE completed and images reviewed today.  Demonstrated multiple small bowel AVMs in the proximal and mid/distal small bowel.  None were actively bleeding.  Discussed his duodenal/cecal AVM disease at length w the patient and his wife again today. Offered push enteroscopy or even single balloon enteroscopy, which he politely declined in favor of continued observation instead.  Discussed the role of enteroscopy and even repeat colonoscopy in order to treat the AVM disease and potentially preventing future bleed, but given the propensity for AVMs to recur, he elected to opt for observation.  -  Repeat CBC and iron panel in 1 month.  Continue monthly surveillance CBC checks for now - Previously had a long discussion with the patient and his wife regarding pathophysiology and natural course of AVMs, with discussion of medical management, which they declined owing to ADR profile, which is a  reasonable decision -No longer taking any antiplatelet therapy. No anticoagulants. Does not take any NSAIDs. -Any endoscopic exams will ne get ed to be done at [facility] given need for APC  2) Acute blood loss anemia: -Hemoglobin stable at 9.3. No ongoing bleeding. Baseline around 10 -Monthly CBC for now, particularly given his desire to hold off on repeat endoscopy at this time  3) CAD 4) CHF, cor pulmonale, pulmonary hypertension - Follows with Cardiology Clinic.  No antiplatelet therapy or anticoagulation planned  5) Non-H. pylori gastritis: -Continue high-dose PPI x8 weeks, then can reduce to 40 mg/day -Discussed repeat upper endoscopy to evaluate for appropriate mucosal healing, which he politely declined and would like to hold off on for now  -Evaluate for nodular appearing mucosa in the duodenum at the time of repeat upper endoscopy as well. Biopsies of this area were benign -Provided appropriate mucosal healing, can decrease PPI and hopefully titrate off, as he is otherwise without reflux, takes no NSAIDs, no plans for aspirin, etc.  6) Tubular Adenoma: -Colonoscopy with 12 mm pedunculated tubular adenoma. Prep was otherwise poor and cannot rule out additional polyps -Repeat colonoscopythis year  7) RUQ pain: Clinical symptoms seem most consistent with gas type pain.  Vomited after a single dose of Gas-X, so unclear whether or not this would be efficacious.  Otherwise, pain is now resolved.  Given location, will rule out biliary etiology as well. -Check LAE's today - Continue trial of simethicone/Gas-X -If pain recurs, plan for RUQ [place]  8) Constipation: - Increase water intake.  Stop soda - Colace 100 mg p.o. twice daily, then titrate to effect -Increase dietary fiber, and can consider adding fiber supplement  RTC in 107monthor sooner as needed.  Plan to review optimal timing for repeat upper endoscopy/colonoscopy at that time  I spent a total of 15 minutes of face-to-face time with the patient. Greater than 50% of the time was spent counseling and coordinating care.   VLavena Bullion,DO, FACG 10/30/2018, 2:00 PM   Addendum: Called the patient to discuss the results of his LAE's from this afternoon, which are notable for significantly elevated AST/ALT at 1097/579, along with elevated ALP at 692, all previously normal. T bili also mildly elevated at 1.6. Discussed these results at length with the patient. He still has not had recurrence of abdominal pain and otherwise feels well. Has taken TheraFlu over the last couple of days, but only taking 2 teaspoons at nighttime (approximately 650 mg of APAP). Otherwise, no new medications. Advice was to head to the ER for expedited evaluation with labs and imaging studies,  but he would prefer to stay out of the ER due to prolonged wait last night along with the current COVID-19 pandemic issues. Will instead plan for expedited outpatient evaluation as follows:   - Repeat LAE's tomorrow (10/31/2018) to trend  - Check Tylenol level, acute viral hepatitis panel, ANA, immunoglobulin panel, ceruloplasmin  - RUQ ultrasound with Dopplers. Will call first thing in the morning to expedite this study with Radiology  - If recurrence of pain, strongly encouraged him to return to the ER for expedited evaluation and possible admission  - If LAE is uptrending, may consider admission for expedited evaluation

## 2018-10-30 NOTE — Telephone Encounter (Signed)
Thanks Ulice Dash.  Larena Glassman, Can you please reach out to Mr. Dahan today to see that his sxs have resolved or if he's had any recurrence. I agree with Dr. Vena Rua advice of prn Gas-X, bland diet today. Thanks.

## 2018-10-30 NOTE — Telephone Encounter (Signed)
Called and spoke with patient's wife-verified DPR-Sharon-wife reports patient is still having abd pain that is unresolved with taking Gas-X (made the pain worse and the patient subsequently vomited after taking medication); patient has not eaten or drank anything since last night even though a bland diet was recommended by DOD; wife is requesting the patient be seen in the office and then transferred to the ER as the patinet "would get into the emergency department quicker if he is sent by the doctor to them"; wife states the patient is having times of extreme pain where he is nauseated and "no gas is able to come out through the bottom end and I think he has a blockage";  Please advise on plan

## 2018-10-30 NOTE — Telephone Encounter (Signed)
Are we able to work him in with one of the APPs to be seen in person today? If not, I can potentially see in between by afternoon procedures at the [facility] office (around 2:30).

## 2018-10-31 ENCOUNTER — Other Ambulatory Visit: Payer: Self-pay

## 2018-10-31 ENCOUNTER — Ambulatory Visit (HOSPITAL_BASED_OUTPATIENT_CLINIC_OR_DEPARTMENT_OTHER): Admission: RE | Admit: 2018-10-31 | Payer: 59 | Source: Ambulatory Visit

## 2018-10-31 ENCOUNTER — Other Ambulatory Visit: Payer: Self-pay | Admitting: Gastroenterology

## 2018-10-31 ENCOUNTER — Other Ambulatory Visit (INDEPENDENT_AMBULATORY_CARE_PROVIDER_SITE_OTHER): Payer: 59

## 2018-10-31 DIAGNOSIS — R1011 Right upper quadrant pain: Secondary | ICD-10-CM

## 2018-10-31 DIAGNOSIS — K5521 Angiodysplasia of colon with hemorrhage: Secondary | ICD-10-CM | POA: Diagnosis not present

## 2018-10-31 DIAGNOSIS — K2970 Gastritis, unspecified, without bleeding: Secondary | ICD-10-CM | POA: Diagnosis not present

## 2018-10-31 DIAGNOSIS — I519 Heart disease, unspecified: Secondary | ICD-10-CM

## 2018-10-31 DIAGNOSIS — K5520 Angiodysplasia of colon without hemorrhage: Secondary | ICD-10-CM

## 2018-10-31 DIAGNOSIS — D126 Benign neoplasm of colon, unspecified: Secondary | ICD-10-CM | POA: Diagnosis not present

## 2018-10-31 DIAGNOSIS — I509 Heart failure, unspecified: Secondary | ICD-10-CM

## 2018-10-31 DIAGNOSIS — D62 Acute posthemorrhagic anemia: Secondary | ICD-10-CM

## 2018-10-31 DIAGNOSIS — K2990 Gastroduodenitis, unspecified, without bleeding: Secondary | ICD-10-CM

## 2018-10-31 LAB — CBC WITH DIFFERENTIAL/PLATELET
Basophils Absolute: 0.1 10*3/uL (ref 0.0–0.1)
Basophils Relative: 0.8 % (ref 0.0–3.0)
Eosinophils Absolute: 0.3 10*3/uL (ref 0.0–0.7)
Eosinophils Relative: 4.3 % (ref 0.0–5.0)
HCT: 27.9 % — ABNORMAL LOW (ref 39.0–52.0)
Hemoglobin: 9.2 g/dL — ABNORMAL LOW (ref 13.0–17.0)
Lymphocytes Relative: 25 % (ref 12.0–46.0)
Lymphs Abs: 1.8 10*3/uL (ref 0.7–4.0)
MCHC: 33 g/dL (ref 30.0–36.0)
MCV: 97.5 fl (ref 78.0–100.0)
Monocytes Absolute: 0.7 10*3/uL (ref 0.1–1.0)
Monocytes Relative: 9.7 % (ref 3.0–12.0)
Neutro Abs: 4.3 10*3/uL (ref 1.4–7.7)
Neutrophils Relative %: 60.2 % (ref 43.0–77.0)
Platelets: 177 10*3/uL (ref 150.0–400.0)
RBC: 2.86 Mil/uL — ABNORMAL LOW (ref 4.22–5.81)
RDW: 19.4 % — ABNORMAL HIGH (ref 11.5–15.5)
WBC: 7.1 10*3/uL (ref 4.0–10.5)

## 2018-10-31 LAB — HEPATIC FUNCTION PANEL
ALT: 316 U/L — ABNORMAL HIGH (ref 0–53)
AST: 321 U/L — ABNORMAL HIGH (ref 0–37)
Albumin: 3.6 g/dL (ref 3.5–5.2)
Alkaline Phosphatase: 534 U/L — ABNORMAL HIGH (ref 39–117)
Bilirubin, Direct: 0.3 mg/dL (ref 0.0–0.3)
Total Bilirubin: 0.8 mg/dL (ref 0.2–1.2)
Total Protein: 7.1 g/dL (ref 6.0–8.3)

## 2018-10-31 LAB — FERRITIN: Ferritin: 210.4 ng/mL (ref 22.0–322.0)

## 2018-10-31 NOTE — Progress Notes (Signed)
I have spoke to patients wife and given her instructions. Patient is scheduled for a Ultrasound at [facility] today at 6:30pm. I have also ask him to go to our [place] office to have labs drawn today.

## 2018-10-31 NOTE — Progress Notes (Signed)
Was notified by [place] [facility] that they could not do the ultrasound. Patient is schedule at [facility] on 11/04/18 at 11:30am. Patient was notified.

## 2018-11-01 ENCOUNTER — Encounter: Payer: Self-pay | Admitting: Rheumatology

## 2018-11-01 ENCOUNTER — Other Ambulatory Visit: Payer: Self-pay

## 2018-11-01 ENCOUNTER — Ambulatory Visit (INDEPENDENT_AMBULATORY_CARE_PROVIDER_SITE_OTHER): Payer: 59 | Admitting: Rheumatology

## 2018-11-01 ENCOUNTER — Other Ambulatory Visit (HOSPITAL_BASED_OUTPATIENT_CLINIC_OR_DEPARTMENT_OTHER): Payer: 59

## 2018-11-01 VITALS — BP 145/90 | HR 93 | Resp 13 | Ht 67.5 in | Wt 114.0 lb

## 2018-11-01 DIAGNOSIS — I7300 Raynaud's syndrome without gangrene: Secondary | ICD-10-CM

## 2018-11-01 DIAGNOSIS — Z8709 Personal history of other diseases of the respiratory system: Secondary | ICD-10-CM

## 2018-11-01 DIAGNOSIS — I519 Heart disease, unspecified: Secondary | ICD-10-CM

## 2018-11-01 DIAGNOSIS — M349 Systemic sclerosis, unspecified: Secondary | ICD-10-CM

## 2018-11-01 DIAGNOSIS — Z8679 Personal history of other diseases of the circulatory system: Secondary | ICD-10-CM

## 2018-11-01 DIAGNOSIS — I3139 Other pericardial effusion (noninflammatory): Secondary | ICD-10-CM

## 2018-11-01 DIAGNOSIS — I313 Pericardial effusion (noninflammatory): Secondary | ICD-10-CM

## 2018-11-01 DIAGNOSIS — I272 Pulmonary hypertension, unspecified: Secondary | ICD-10-CM

## 2018-11-01 DIAGNOSIS — Z87448 Personal history of other diseases of urinary system: Secondary | ICD-10-CM

## 2018-11-01 DIAGNOSIS — R809 Proteinuria, unspecified: Secondary | ICD-10-CM

## 2018-11-01 DIAGNOSIS — I2510 Atherosclerotic heart disease of native coronary artery without angina pectoris: Secondary | ICD-10-CM

## 2018-11-01 DIAGNOSIS — R3121 Asymptomatic microscopic hematuria: Secondary | ICD-10-CM

## 2018-11-01 DIAGNOSIS — F17200 Nicotine dependence, unspecified, uncomplicated: Secondary | ICD-10-CM

## 2018-11-01 DIAGNOSIS — M1A09X0 Idiopathic chronic gout, multiple sites, without tophus (tophi): Secondary | ICD-10-CM

## 2018-11-01 DIAGNOSIS — L80 Vitiligo: Secondary | ICD-10-CM

## 2018-11-01 DIAGNOSIS — E559 Vitamin D deficiency, unspecified: Secondary | ICD-10-CM

## 2018-11-01 DIAGNOSIS — R945 Abnormal results of liver function studies: Secondary | ICD-10-CM

## 2018-11-01 DIAGNOSIS — R7989 Other specified abnormal findings of blood chemistry: Secondary | ICD-10-CM

## 2018-11-01 DIAGNOSIS — Z9861 Coronary angioplasty status: Secondary | ICD-10-CM

## 2018-11-04 ENCOUNTER — Encounter (HOSPITAL_COMMUNITY): Payer: 59 | Admitting: Internal Medicine

## 2018-11-04 ENCOUNTER — Other Ambulatory Visit: Payer: Self-pay

## 2018-11-04 ENCOUNTER — Ambulatory Visit (HOSPITAL_COMMUNITY)
Admission: RE | Admit: 2018-11-04 | Discharge: 2018-11-04 | Disposition: A | Discharge status: Home / Self Care | Payer: 59 | Source: Ambulatory Visit | Attending: Gastroenterology | Admitting: Gastroenterology

## 2018-11-04 DIAGNOSIS — R1011 Right upper quadrant pain: Secondary | ICD-10-CM | POA: Insufficient documentation

## 2018-11-04 IMAGING — US ULTRASOUND ABDOMEN LIMITED
2 series · 13 of 25 positions shown · non-contrast
Comparison: None.

CLINICAL DATA: Right upper quadrant pain.  History of GI bleed.

EXAM:
LIMITED RIGHT UPPER QUADRANT ULTRASOUND.
DUPLEX ULTRASOUND OF LIVER
TECHNIQUE: Color and duplex Doppler ultrasound was performed to evaluate the
hepatic in-flow and out-flow vessels.

[Series 1: ultrasound abdomen limited · 11 of 137 slices shown (1 of 2)]
[im 1/137]
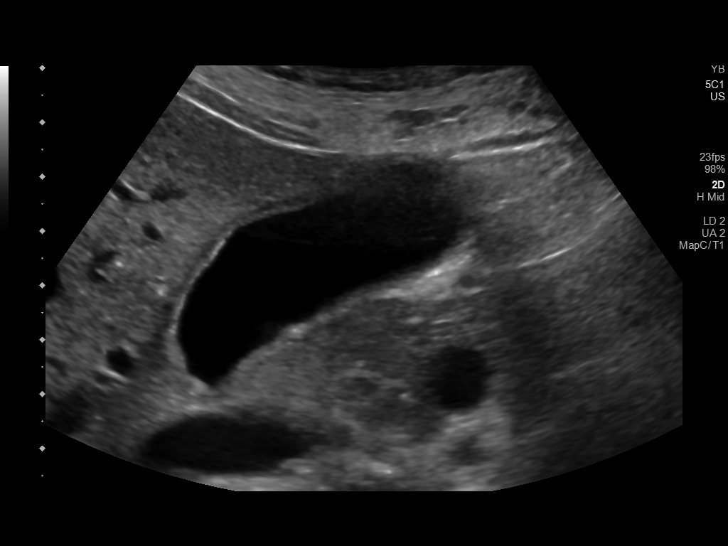
[im 14/137]
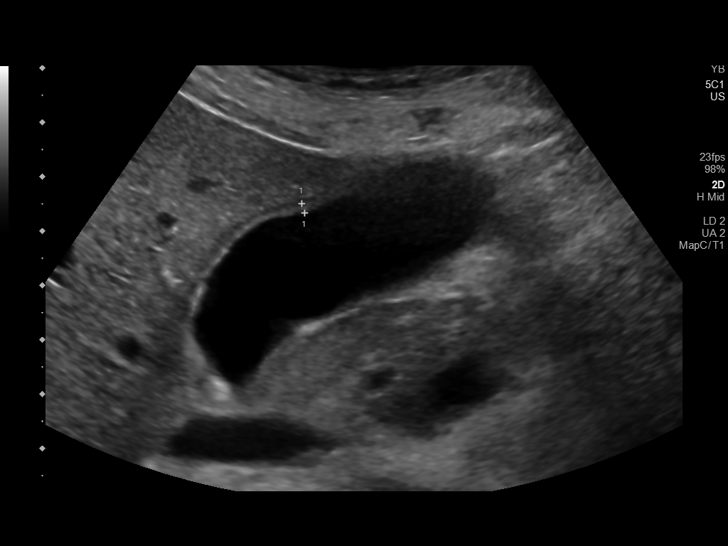
[im 28/137]
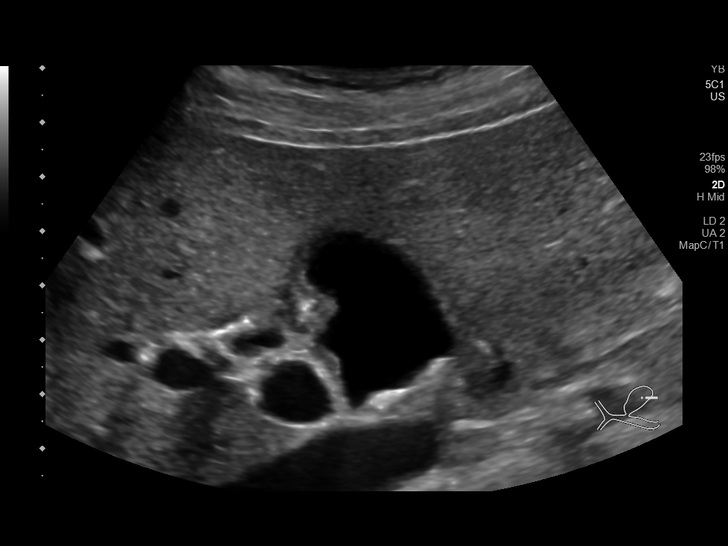
[im 41/137]
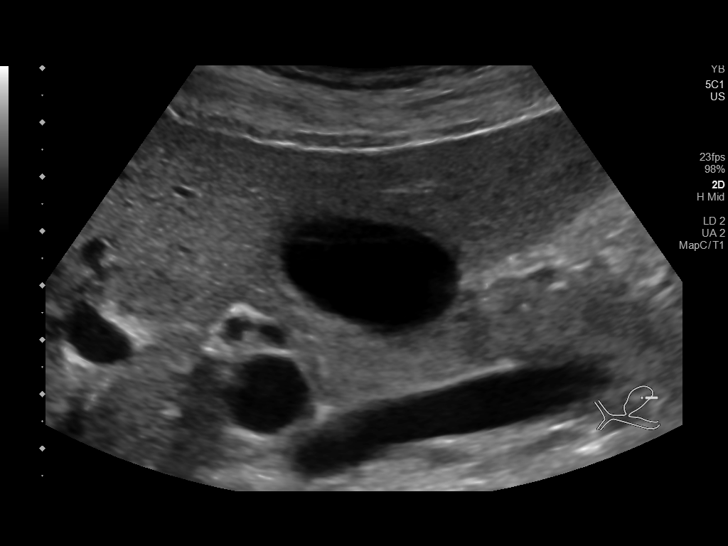
[im 55/137]
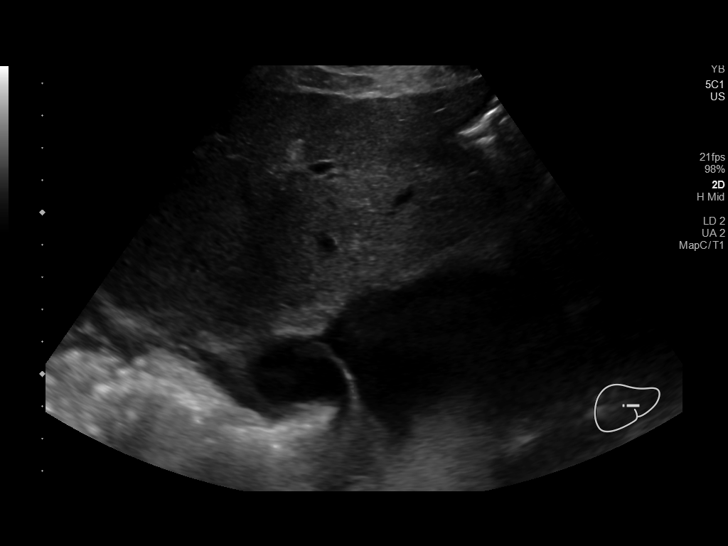
[im 69/137]
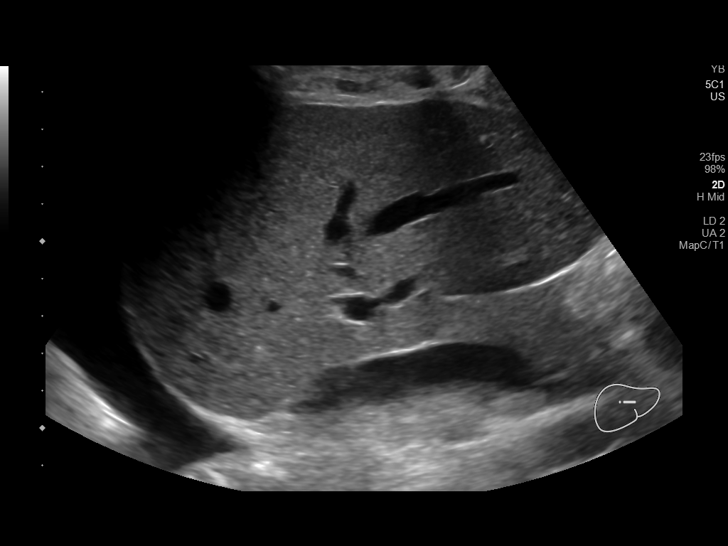
[im 82/137]
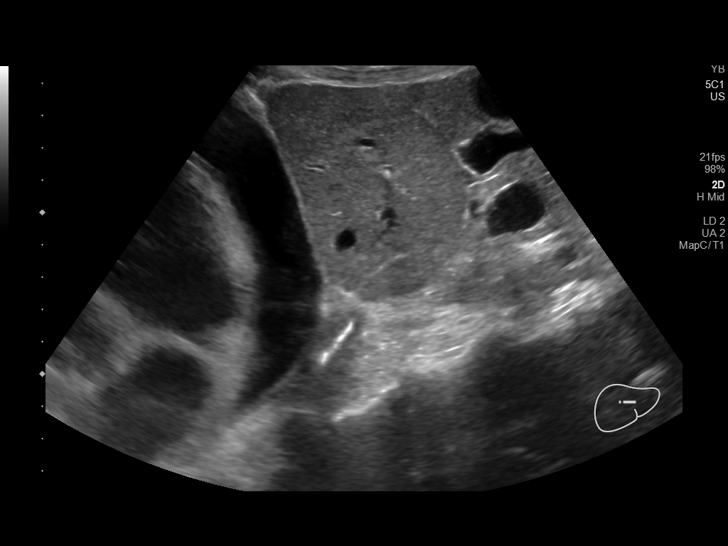
[im 96/137]
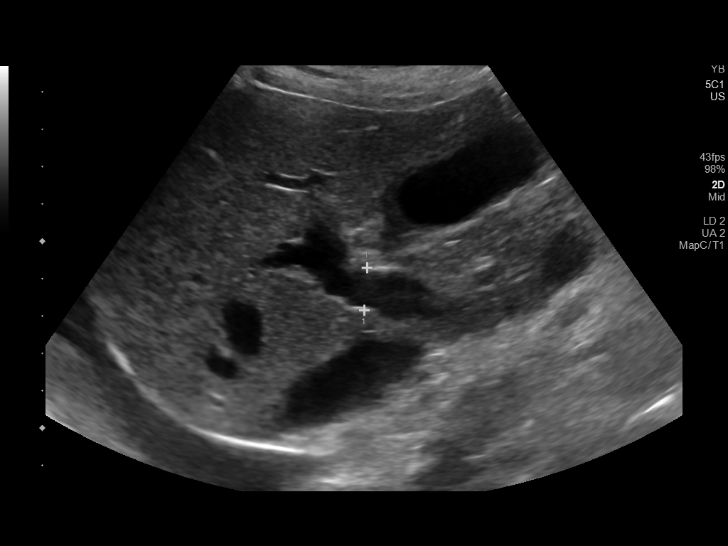
[im 109/137]
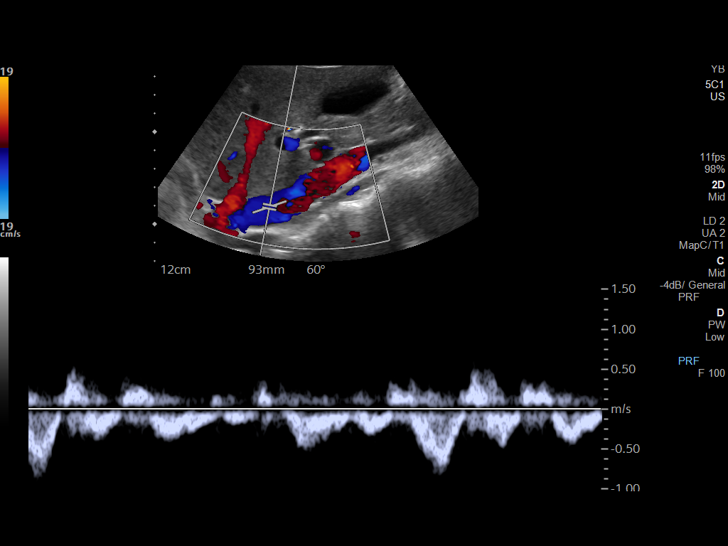
[im 123/137]
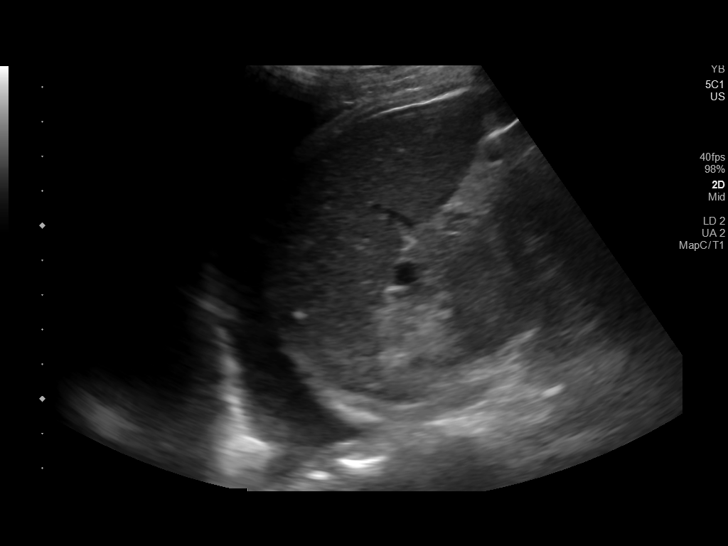
[im 137/137]
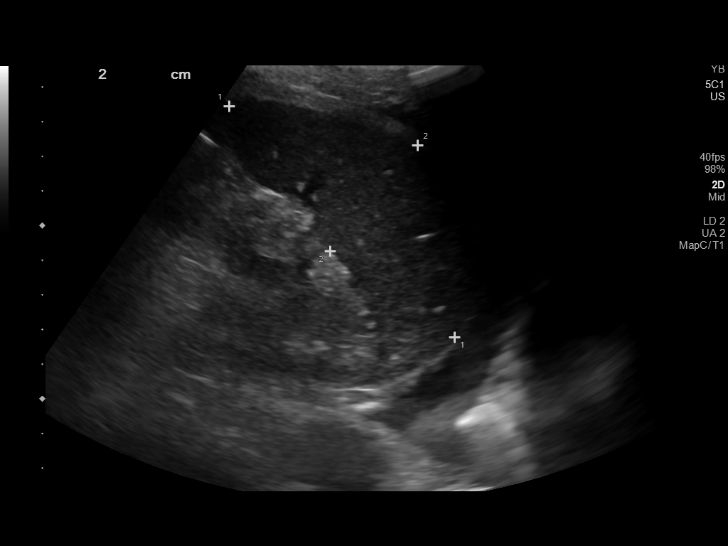

[Series 2: ultrasound abdomen limited · 2 of 23 slices shown (2 of 2)]
[im 8/23]
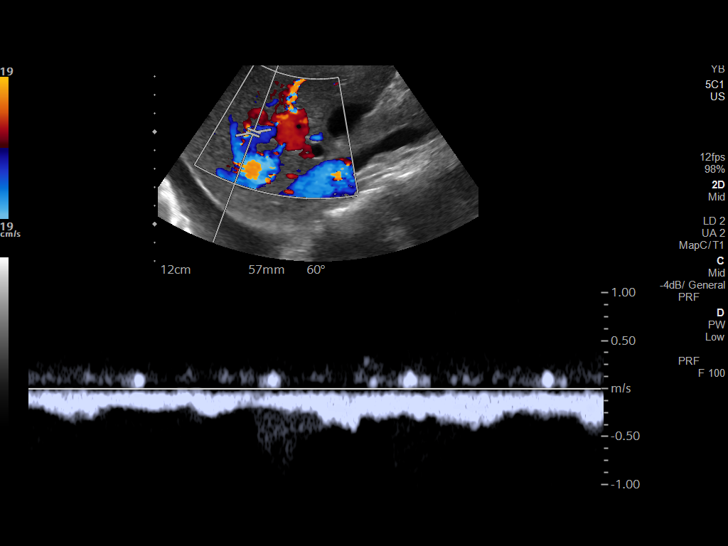
[im 23/23]
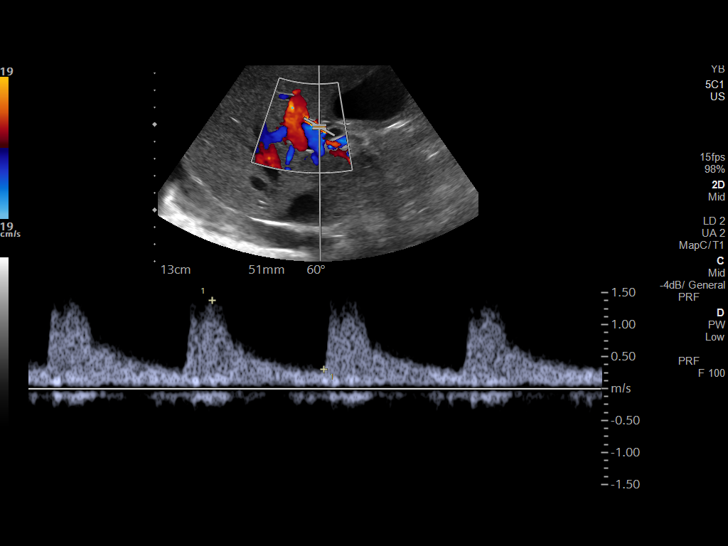

[13 of 25 positions shown; findings below may reference images not displayed]

FINDINGS: Gallbladder : Moderately distended. No definite stones. 6 mm
echogenic focus lies adjacent to the lower gallbladder segment. This
appears to be extraluminal. No wall thickening or pericholecystic
fluid.

Biliary tree: Common bile duct normal in caliber measuring 5 mm. No
intrahepatic bile duct dilation.

Liver: Normal parenchymal echogenicity. Normal hepatic contour
without nodularity.

No focal lesion, mass or intrahepatic biliary ductal dilatation.

Main Portal Vein size: 1.1 cm cm

Portal Vein Velocities-all hepatopetal

Main Prox:  34.9 cm/sec

Main Mid: 32.6 cm/sec

Main Dist:  34.5 cm/sec
Right: 46.7 cm/sec
Left: 28 cm/sec

Hepatic Vein Velocities-all hepatofugal

Right:  55.5 cm/sec

Middle:  38.3 cm/sec

Left:  45 cm/sec

IVC: Present and patent with normal respiratory phasicity.

Hepatic Artery Velocity:  137.9 cm/sec

Splenic Vein Velocity:  107.9 cm/sec

Spleen: 7.3 cm x 4.0 cm x 9.3 cm with a total volume of
approximately 120 cm^3 (411 cm^3 is upper limit normal)

Portal Vein Occlusion/Thrombus: No

Splenic Vein Occlusion/Thrombus: No

Ascites: No definite ascites visualized.

Varices: None

Bilateral pleural effusions.
IMPRESSION: 1. No acute findings. Normal sonographic appearance of the liver. No
bile duct dilation.
2. Normal liver Doppler. Normal velocity, normal directional flow in
the portal and hepatic veins.
3. Bilateral pleural effusions.

## 2018-11-05 LAB — ANTI-NUCLEAR AB-TITER (ANA TITER)
ANA TITER: 1:1280 {titer} — ABNORMAL HIGH
ANA TITER: 1:80 {titer} — ABNORMAL HIGH
ANA Titer 1: 1:1280 {titer} — ABNORMAL HIGH

## 2018-11-05 LAB — HEPATITIS PANEL, ACUTE
Hep A IgM: NONREACTIVE
Hep B C IgM: NONREACTIVE
Hepatitis B Surface Ag: NONREACTIVE
Hepatitis C Ab: NONREACTIVE
SIGNAL TO CUT-OFF: 0.05 (ref <1.00)

## 2018-11-05 LAB — CERULOPLASMIN: Ceruloplasmin: 35 mg/dL (ref 18–36)

## 2018-11-05 LAB — ANA: Anti Nuclear Antibody (ANA): POSITIVE — AB

## 2018-11-05 LAB — IGG, IGA, IGM
IgG (Immunoglobin G), Serum: 1288 mg/dL (ref 600–1640)
IgM, Serum: 101 mg/dL (ref 50–300)
Immunoglobulin A: 316 mg/dL — ABNORMAL HIGH (ref 47–310)

## 2018-11-05 LAB — ACETAMINOPHEN LEVEL: Acetaminophen (Tylenol), Serum: <10 mg/L — ABNORMAL LOW (ref 10–20)

## 2018-11-07 ENCOUNTER — Other Ambulatory Visit: Payer: Self-pay

## 2018-11-07 DIAGNOSIS — R7989 Other specified abnormal findings of blood chemistry: Secondary | ICD-10-CM

## 2018-11-07 DIAGNOSIS — R760 Raised antibody titer: Secondary | ICD-10-CM

## 2018-11-08 ENCOUNTER — Telehealth: Payer: Self-pay | Admitting: Gastroenterology

## 2018-11-08 ENCOUNTER — Other Ambulatory Visit: Payer: Self-pay | Admitting: Cardiovascular Disease

## 2018-11-08 NOTE — Telephone Encounter (Signed)
Called and informed patient's wife of previous note from Dr. Bryan Lemma; patient's wife verbalized understanding of information; also advised to call back to office should questions/concerns arise;

## 2018-11-08 NOTE — Telephone Encounter (Signed)
[name] called on behalf of patient to advised that the [facility] is [facility]

## 2018-11-08 NOTE — Telephone Encounter (Addendum)
Called and spoke with patient and patient's wife- patient reports he does not "want to go to the hospital where the COVID is" to get his requested COVID test done; patient informed of testing area not being near the patients that are being treated for COVID; patient and patient's wife verbalized understanding of need to go for COVID testing as it is a drive thru process where the patient does not get out of car;  Patient is adamant on not going to [facility] hospital-but will go to a testing site where there are not COVID patient's being treated--  Please advise on nest step for patient-

## 2018-11-08 NOTE — Telephone Encounter (Signed)
All I can do is advise him to have the test done, as it is a reasonable explanation for his elevated LAEs. Agree with the explanation of testing facility vs tx facility locations. But certainly cannot force him. Will continue all other evaluation as planned previously. Thanks for talking with them.

## 2018-11-12 ENCOUNTER — Encounter: Payer: Self-pay | Admitting: Gastroenterology

## 2018-11-15 ENCOUNTER — Other Ambulatory Visit (INDEPENDENT_AMBULATORY_CARE_PROVIDER_SITE_OTHER): Payer: 59

## 2018-11-15 DIAGNOSIS — R760 Raised antibody titer: Secondary | ICD-10-CM | POA: Diagnosis not present

## 2018-11-15 DIAGNOSIS — R7989 Other specified abnormal findings of blood chemistry: Secondary | ICD-10-CM

## 2018-11-15 DIAGNOSIS — R945 Abnormal results of liver function studies: Secondary | ICD-10-CM

## 2018-11-15 LAB — HEPATIC FUNCTION PANEL
ALT: 13 U/L (ref 0–53)
AST: 16 U/L (ref 0–37)
Albumin: 3.6 g/dL (ref 3.5–5.2)
Alkaline Phosphatase: 174 U/L — ABNORMAL HIGH (ref 39–117)
Bilirubin, Direct: 0.1 mg/dL (ref 0.0–0.3)
Total Bilirubin: 0.4 mg/dL (ref 0.2–1.2)
Total Protein: 6.7 g/dL (ref 6.0–8.3)

## 2018-11-19 LAB — ANTI-SMOOTH MUSCLE ANTIBODY, IGG: Actin (Smooth Muscle) Antibody (IGG): <20 U (ref <20)

## 2018-11-19 LAB — MITOCHONDRIAL ANTIBODIES: Mitochondrial M2 Ab, IgG: <=20 U

## 2018-11-19 LAB — ANTI-MICROSOMAL ANTIBODY LIVER / KIDNEY: LKM1 Ab: <=20 U (ref <=20.0)

## 2018-12-17 ENCOUNTER — Other Ambulatory Visit: Payer: Self-pay

## 2018-12-17 ENCOUNTER — Encounter: Payer: Self-pay | Admitting: Gastroenterology

## 2018-12-17 ENCOUNTER — Telehealth (HOSPITAL_COMMUNITY): Payer: Self-pay

## 2018-12-17 ENCOUNTER — Ambulatory Visit: Payer: 59 | Admitting: Gastroenterology

## 2018-12-17 ENCOUNTER — Telehealth: Payer: Self-pay

## 2018-12-17 VITALS — BP 152/80 | HR 84 | Temp 98.2°F | Ht 68.0 in | Wt 117.1 lb

## 2018-12-17 DIAGNOSIS — N183 Chronic kidney disease, stage 3 unspecified: Secondary | ICD-10-CM

## 2018-12-17 DIAGNOSIS — K2970 Gastritis, unspecified, without bleeding: Secondary | ICD-10-CM

## 2018-12-17 DIAGNOSIS — I509 Heart failure, unspecified: Secondary | ICD-10-CM

## 2018-12-17 DIAGNOSIS — R945 Abnormal results of liver function studies: Secondary | ICD-10-CM

## 2018-12-17 DIAGNOSIS — D126 Benign neoplasm of colon, unspecified: Secondary | ICD-10-CM

## 2018-12-17 DIAGNOSIS — R7989 Other specified abnormal findings of blood chemistry: Secondary | ICD-10-CM

## 2018-12-17 DIAGNOSIS — K5521 Angiodysplasia of colon with hemorrhage: Secondary | ICD-10-CM | POA: Diagnosis not present

## 2018-12-17 DIAGNOSIS — I519 Heart disease, unspecified: Secondary | ICD-10-CM

## 2018-12-17 DIAGNOSIS — K5520 Angiodysplasia of colon without hemorrhage: Secondary | ICD-10-CM | POA: Diagnosis not present

## 2018-12-17 DIAGNOSIS — K2990 Gastroduodenitis, unspecified, without bleeding: Secondary | ICD-10-CM

## 2018-12-17 NOTE — Telephone Encounter (Signed)
Faxed Medical Records to [employer] [phone] ) on 12/13/2018.

## 2018-12-17 NOTE — Telephone Encounter (Signed)
Covid-19 screening questions   Do you now or have you had a fever in the last 14 days? No  Do you have any respiratory symptoms of shortness of breath or cough now or in the last 14 days? No  Do you have any family members or close contacts with diagnosed or suspected Covid-19 in the past 14 days? No  Have you been tested for Covid-19 and found to be positive? Was tested but came back negative

## 2018-12-17 NOTE — Patient Instructions (Signed)
If you are age 60 or older, your body mass index should be between 23-30. Your Body mass index is 17.81 kg/m. If this is out of the aforementioned range listed, please consider follow up with your Primary Care Provider.  If you are age 29 or younger, your body mass index should be between 19-25. Your Body mass index is 17.81 kg/m. If this is out of the aformentioned range listed, please consider follow up with your Primary Care Provider.   It has been recommended to you by your physician that you have a(n) Colonoscopy completed. We did not schedule the procedure(s) today, you will be contacted by our office to have this set up.  Follow up in 6 months.   It was a pleasure to see you today!  Vito Cirigliano, D.O.

## 2018-12-17 NOTE — Progress Notes (Signed)
P  Chief Complaint:    Elevated liver enzymes  GI History: 58 year old malewith a history of CAD, CHF, pulmonary hypertension,pericardial effusions/ppericardial window in 2010, CKD, Scleroderma, Raynaud's, hyperlipidemia, hypertension, prior MIadmitted 5/6-9 with duodenal and colonic AVMs, treated with APCand 2 units PRBCs.There was a significant burden of AVMs in the cecum, to include active oozing. Bleeding all treated with APC and discharged home without any antiplatelet therapy. No anticoagulation. Was unfortunately readmitted 5/23 for hemodynamically significant bleeding, requiring IR coil embolization of the cecal artery on 5/23 and 1 unit PRBCs.  Last seen by me on 10/30/2018 with acute onset RUQ pain.  Evaluation notable for elevated liver enzymes at AST/ALT/ALP/T bili at 1097/579/692/1.6, all previously normal. Was advised to go to the ER, but he elected to treat as an outpatient due to COVID-19 related concerns.  LAEs rapidly down trended the following day, and further evaluation as below:  - 10/31/2018: AST/ALT/ALP/T bili: 321/316/534/0 0.8 -11/15/2018: AST/ALT/ALP/T bili: 16/13/174/0 0.4 - ANA+ at 1:1280 and a nuclear, speckled pattern (has history of scleroderma and Raynaud's though) - APAP, acute hepatitis panel negative/normal -IgA mildly elevated at 316 (ULN 310) - Normal IgG, IgM, ceruloplasmin, ferritin, ASMA, LKM, AMA -Refused to go for COVID testing - H/H at baseline (9.2/28) -RUQ [place] with Doppler: Normal, bilateral pleural effusions  Endoscopic history: -EGD (08/30/2018, Dr. Bryan Lemma): 4 duodenal AVMs treated with APC,polypoid cystic type lesion in third portion of the duodenum (biopsy benign), nodular area in the second portion of the duodenum (biopsybenign), benign lymphangiectasia in the small bowel, and diffuse, moderate gastritis (biopsywith benign fundic gland polyps, no H. pylori). Recommended repeat upper endoscopy in 8 weeks. -Colonoscopy (08/30/2018,  Dr. Cirigliano):multiple bleeding AVMs in the cecum, treated with APC, with additional AVMs in the ascending, descending, sigmoid colon, all treated with APC. 12 mm tubular adenoma resected from transverse colon. Prep was poor, with recommendation repeat in 6 months - VCE (09/2018, Dr. Bryan Lemma): AVMs noted in the proximal small bowel, starting at 00:27:42, and mid small intestine, located at 02:10, 02:32, 02:57 and 04:03.  Capsule did not transit into the cecum before end of study.  HPI:     Patient is a 58 y.o. male presenting to the [facility]ology [facility] for follow-up.  He was last seen by me in 10/2018 with RUQ pain.  Evaluation at that time was significantly elevated liver enzymes, which were all previously normal.  Extensive work-up mostly unrevealing, aside from an elevated ANA (does have history of Scleroderma).  Enzymes were rapidly downtrending the following day and normalized 2 weeks later.  No prior history of known liver disease, elevated liver enzymes, jaundice, icteric sclera, ascites.  Today states he feels well and back to baseline.  Tolerating all p.o. intake, and has gained a couple of pounds with improved appetite.  Offers no complaints today.   Review of systems:     No chest pain, no SOB, no fevers, no urinary sx   Past Medical History:  Diagnosis Date  . Anxiety   . CAD (coronary artery disease)   . Gout   . Hyperlipidemia   . Hypertension 05/14/2012    Lexiscan-- EF 51% ,LV normal  . Hypothyroid   . MI (myocardial infarction) ([place]) 2010  . Pericardial effusion 12/2008   Dr [name] performed a subxiphoid window removing 26m of fluid  . Pericardial effusion 03/09/2010   Echo-LVEF >55%, very small pericardial effusion ,,Stage 1 (impaired ) diastolic fxn, elevated LV filling  . Pericarditis   . Raynaud's phenomenon   .  Scleroderma ([place])   . Smoker 09/16/2018   1 ppd  . Vitiligo     Patient's surgical history, family medical history, social history,  medications and allergies were all reviewed in Epic    Current Outpatient Medications  Medication Sig Dispense Refill  . allopurinol (ZYLOPRIM) 100 MG tablet TAKE 1 TABLET BY MOUTH EVERY DAY 30 tablet 2  . ALPRAZolam (XANAX) 0.5 MG tablet Take 0.5 mg by mouth daily as needed for anxiety.     . colchicine 0.6 MG tablet Take 1 tablet (0.6 mg total) by mouth daily as needed. (Patient taking differently: Take 0.6 mg by mouth daily as needed (gout flare). ) 30 tablet 2  . Cyanocobalamin (B-12) 2500 MCG TABS Take 2,500 mcg by mouth daily.    . fluticasone (FLONASE) 50 MCG/ACT nasal spray Place 2 sprays into both nostrils daily as needed for allergies.   11  . HYDROcodone-acetaminophen (NORCO) 10-325 MG tablet Take 0.5 tablets by mouth 2 (two) times daily as needed for moderate pain.     . macitentan (OPSUMIT) 10 MG tablet Take 1 tablet (10 mg total) by mouth daily. 90 tablet 3  . sildenafil (REVATIO) 20 MG tablet Take 1 tablet (20 mg total) by mouth 3 (three) times daily. 90 tablet 12  . simvastatin (ZOCOR) 40 MG tablet Take 1 tablet (40 mg total) by mouth daily. Daily in the morning 30 tablet 11   No current facility-administered medications for this visit.     Physical Exam:     Temp 98.2 F (36.8 C)   Ht _0  (1.727 m)   Wt 117 lb 2 oz (53.1 kg)   BMI 17.81 kg/m   GENERAL:  Pleasant male in NAD PSYCH: : Cooperative, normal affect EENT:  conjunctiva pink, mucous membranes moist, neck supple without masses CARDIAC:  RRR, no murmur heard, no peripheral edema PULM: Normal respiratory effort, lungs CTA bilaterally, no wheezing ABDOMEN:  Nondistended, soft, nontender. No obvious masses, no hepatomegaly,  normal bowel sounds SKIN:  turgor, no lesions seen Musculoskeletal:  Normal muscle tone, normal strength NEURO: Alert and oriented x 3, no focal neurologic deficits   IMPRESSION and PLAN:    1)AVMs: EGD/colonoscopy5/2020with duodenal AVMs and multiple colonic AVMs, all treated  with APC. Unfortunately readmitted with hemodynamically significant bleed5/23, requiring IR coil embolization of the cecal artery with cessation of bleeding.No recurrent bleeding since then.  VCE completed and images reviewed today.  Demonstrated multiple small bowel AVMs in the proximal and mid/distal small bowel.  None were actively bleeding.  -Previously declined push enteroscopy or single balloon enteroscopy in favor of observation -Repeat colonoscopy for routine polyp surveillance as below.  Can certainly evaluate for colonic AVMs at that time -Recent CBC and iron panel stable -Repeat CBC and iron panel in another 2 months (October)  -No longer taking any antiplatelet therapy. No anticoagulants. Does not take any NSAIDs. -Any endoscopic exams will ne get ed to be done at [facility] given need for APC  2) Acute on chronic anemia: -Hemoglobin stable at 9.3. No ongoing bleeding. Baseline around 10 -Repeat labs in another 2 months  3) CAD 4) CHF, cor pulmonale, pulmonary hypertension -Follows with Cardiology Clinic. No antiplatelet therapy or anticoagulation planned  5) Non-H. pylori gastritis: - Completed 8-week course of high-dose PPI -Again discussed repeat upper endoscopy to evaluate for appropriate mucosal healing, which he politely declined and would like to hold off on for now  6) Tubular Adenoma: -Colonoscopy with 12 mm pedunculated tubular adenoma.  Prep was otherwise poor and cannot rule out additional polyps -Schedule for colonoscopy at [facility] in October  7) Elevated liver enzymes/RUQ pain -Resolved.  No recurrence of RUQ pain -RUQ [place] and Doppler normal - Exact etiology not certain, but swift return to normal a suspicious for viral etiology.  Could also be medication effect, but has not stopped any medications that he was taking at that time, so less suspicious of this.  Either way, with normalization and no return of symptoms, no further work-up at this  time.  8) Constipation: -Resolved/controlled with fluids, increase dietary fiber, Colace as needed  9) CKD 3: -Follows at [facility]  The indications, risks, and benefits of colonoscopy were explained to the patient in detail. Risks include but are not limited to bleeding, perforation, adverse reaction to medications, and cardiopulmonary compromise. Sequelae include but are not limited to the possibility of surgery, hospitalization, and mortality. The patient verbalized understanding and wished to proceed. All questions answered, referred to the scheduler and bowel prep ordered. Further recommendations pending results of the exam.            Lavena Bullion ,DO, FACG 12/17/2018, 9:26 AM

## 2018-12-18 ENCOUNTER — Telehealth: Payer: Self-pay | Admitting: Rheumatology

## 2018-12-18 NOTE — Telephone Encounter (Signed)
Patient's wife came by today with a disablity form for [employer] to fill out for patient. Wife paid $25.00 in cash for form, that was sent to [employer] on 12/18/2018.

## 2019-01-13 ENCOUNTER — Telehealth (HOSPITAL_COMMUNITY): Payer: Self-pay | Admitting: *Deleted

## 2019-01-13 NOTE — Telephone Encounter (Signed)
pts wife left VM stating patient had a "bad cough" she requested a return call to to get a list of otc medications. I called her and read her the list of OTC medications he could use also scheduled 41mf/u from recall.

## 2019-01-29 ENCOUNTER — Ambulatory Visit (HOSPITAL_COMMUNITY)
Admission: RE | Admit: 2019-01-29 | Discharge: 2019-01-29 | Disposition: A | Discharge status: Home / Self Care | Payer: 59 | Source: Ambulatory Visit | Attending: Internal Medicine | Admitting: Internal Medicine

## 2019-01-29 ENCOUNTER — Other Ambulatory Visit: Payer: Self-pay

## 2019-01-29 ENCOUNTER — Encounter (HOSPITAL_COMMUNITY): Payer: Self-pay | Admitting: Internal Medicine

## 2019-01-29 VITALS — BP 137/63 | HR 100 | Wt 117.2 lb

## 2019-01-29 DIAGNOSIS — M349 Systemic sclerosis, unspecified: Secondary | ICD-10-CM | POA: Diagnosis not present

## 2019-01-29 DIAGNOSIS — R0601 Orthopnea: Secondary | ICD-10-CM | POA: Diagnosis not present

## 2019-01-29 DIAGNOSIS — I110 Hypertensive heart disease with heart failure: Secondary | ICD-10-CM | POA: Diagnosis not present

## 2019-01-29 DIAGNOSIS — F419 Anxiety disorder, unspecified: Secondary | ICD-10-CM | POA: Diagnosis not present

## 2019-01-29 DIAGNOSIS — I313 Pericardial effusion (noninflammatory): Secondary | ICD-10-CM | POA: Diagnosis not present

## 2019-01-29 DIAGNOSIS — E785 Hyperlipidemia, unspecified: Secondary | ICD-10-CM | POA: Diagnosis not present

## 2019-01-29 DIAGNOSIS — M109 Gout, unspecified: Secondary | ICD-10-CM | POA: Insufficient documentation

## 2019-01-29 DIAGNOSIS — E039 Hypothyroidism, unspecified: Secondary | ICD-10-CM | POA: Diagnosis not present

## 2019-01-29 DIAGNOSIS — I50810 Right heart failure, unspecified: Secondary | ICD-10-CM

## 2019-01-29 DIAGNOSIS — Z955 Presence of coronary angioplasty implant and graft: Secondary | ICD-10-CM | POA: Diagnosis not present

## 2019-01-29 DIAGNOSIS — I5022 Chronic systolic (congestive) heart failure: Secondary | ICD-10-CM | POA: Diagnosis not present

## 2019-01-29 DIAGNOSIS — F17210 Nicotine dependence, cigarettes, uncomplicated: Secondary | ICD-10-CM | POA: Insufficient documentation

## 2019-01-29 DIAGNOSIS — D649 Anemia, unspecified: Secondary | ICD-10-CM | POA: Diagnosis not present

## 2019-01-29 DIAGNOSIS — I2510 Atherosclerotic heart disease of native coronary artery without angina pectoris: Secondary | ICD-10-CM | POA: Diagnosis not present

## 2019-01-29 DIAGNOSIS — R609 Edema, unspecified: Secondary | ICD-10-CM | POA: Diagnosis not present

## 2019-01-29 DIAGNOSIS — I3139 Other pericardial effusion (noninflammatory): Secondary | ICD-10-CM

## 2019-01-29 DIAGNOSIS — I252 Old myocardial infarction: Secondary | ICD-10-CM | POA: Insufficient documentation

## 2019-01-29 DIAGNOSIS — Z79899 Other long term (current) drug therapy: Secondary | ICD-10-CM | POA: Insufficient documentation

## 2019-01-29 DIAGNOSIS — J90 Pleural effusion, not elsewhere classified: Secondary | ICD-10-CM

## 2019-01-29 DIAGNOSIS — I272 Pulmonary hypertension, unspecified: Secondary | ICD-10-CM

## 2019-01-29 MED ORDER — POTASSIUM CHLORIDE CRYS ER 20 MEQ PO TBCR: 20.0000 meq | EXTENDED_RELEASE_TABLET | Freq: Two times a day (BID) | ORAL | 3 refills | Status: DC

## 2019-01-29 MED ORDER — TORSEMIDE 20 MG PO TABS: 20.0000 mg | ORAL_TABLET | Freq: Two times a day (BID) | ORAL | 6 refills | Status: DC

## 2019-01-29 NOTE — Patient Instructions (Signed)
INCREASE Torsemide to 30m (1 tab) twice a day  START Potassium (1 tab) twice a day  Chest Xray at 9am in Radiology  Your physician has requested that you have an echocardiogram on Monday 02/03/19 after the chest xray. Echocardiography is a painless test that uses sound waves to create images of your heart. It provides your doctor with information about the size and shape of your heart and how well your heart's chambers and valves are working. This procedure takes approximately one hour. There are no restrictions for this procedure.  Your physician recommends that you schedule a follow-up appointment in: Monday 02/03/19 with Dr BHaroldine Laws  At the [facility] you and your health needs are our priority. As part of our continuing mission to provide you with exceptional heart care, we have created designated Provider Care Teams. These Care Teams include your primary Cardiologist (physician) and Advanced Practice Providers (APPs- Physician Assistants and Nurse Practitioners) who all work together to provide you with the care you need, when you need it.   You may see any of the following providers on your designated Care Team at your next follow up: .Marland KitchenDr DGlori Bickers. Dr DLoralie Champagne. ADarrick Grinder NP   Please be sure to bring in all your medications bottles to every appointment.

## 2019-01-29 NOTE — Progress Notes (Signed)
ADVANCED HF CLINIC NOTE  Referring Physician: [name]  Primary Care: [name]: [name] [name]  HPI:  JHAN CONERY is a 58 y.o.male with h/o scleroderma (diagnosed early 2000s), tobacco abuse, HTN, CAD s/p MI (s/p dRCA stent 2010) and previous pericardial effusion s/p pericardial window in 2010 by Dr. [name] who is referred by Dr. [name] due to recurrent pericardial effusion.   2-D [id number]/2/[id number] normal LV systolic function, grade 2 diastolic dysfunction with moderate pericardial effusion without evidence of pericardial tamponade.  He was seen in [facility] office on 01/15/2018 due to increasing shortness of breath. 2D echo showed normal LV systolic function with small nonhemodynamically significant pericardial effusion unchanged from last echo. Myoview stress test was normal as well. His hydrochlorthiazide was changed to torsemide.   He saw Dr. [name] in February with worsening SOB and echo done 06/13/18 showed LVEF 40-45% (I felt EF 50-55%)with severe RV dysfunction, RVSP > 147mHG and large pericardial effusion. His diuretics were increased. He has subsequently seen Dr. HRoxan Hockeywho felt his effusion was likely related to his scleroderma and felt he would benefit from medical therapy prior to repeat surgical pericardia drainage which would require VATS.   CXR 06/11/18: Bilateral pleural effusions. No mass  Seen in HF clinic for first time 07/01/18. [facility] to [facility]. Started on sildenafil and opsumit.  In early May found to have hgb 4.6 Got 2u RBC. Endoscopy showed gastritis, congestive gastropathy, duodenal polyp biopsied, duodenal mucosa lymphangiectasia. Colonoscopy; 12 mm polyp removed from transverse colon, multiples bleeding colonicangioectasistreated with argon plasma coagulation.   Readmitted 2 weeks later with SBP 60s, Initial Hgb 6.7 with additional AKI with sCr of 2.51Seen on CTA to have findings concerning  for active hemorrhage, possibly due to previously noted atrial venous malformations.  Gastroenterology and Radiology IR were consulted.  Patient underwent coil embolization of the cecal artery on 5/23 in IR.  Patient returned to ICU for monitoring.  He had no further hematochezia and Hgb counts remained stable.  Subsequently saw GI as outpatient. Had capsule endo which was unrevealing.   Here for routine f[facility]  CXR with bilateral moderate pleural effusions. Hgb 7.8. Feels weak. + orthopnea and PND. No further GI bleeding. No syncope/presyncope. + LE and UE edema    R/LHC 07/01/18  Prox RCA to Mid RCA lesion is 40% stenosed.  Mid RCA lesion is 40% stenosed.  Previously placed Mid RCA to Dist RCA stent (unknown type) is widely patent.  Acute Mrg lesion is 95% stenosed.  Prox LAD lesion is 30% stenosed.  Mid LAD to Dist LAD lesion is 40% stenosed. Findings: Ao = 140/77 (100) LV = 145/14 RA = 16 RV = 79/18 PA = 81/26 (48) PCW = 17 Fick cardiac output/index = 4.6/2.7 PVR = 6.0 WU Ao sat = 97% PA sat = 61%, 64% No RV-LV interaction on simultaneous pressure waveforms with deep breathing Assessment: 1. Non-obstructive CAD with patent RCA stent 2. Moderate to severe PAH 3. No evidence of tamponade   Past Medical History:  Diagnosis Date  . Anxiety   . CAD (coronary artery disease)   . Gout   . Hyperlipidemia   . Hypertension 05/14/2012    Lexiscan-- EF 51% ,LV normal  . Hypothyroid   . [place]  . Pericardial effusion 12/2008   Dr HRoxan Hockeyperformed a subxiphoid window removing 2045mof fluid  . Pericardial  effusion 03/09/2010   Echo-LVEF >55%, very small pericardial effusion ,,Stage 1 (impaired ) diastolic fxn, elevated LV filling  . Pericarditis   . Raynaud's phenomenon   . Scleroderma ([place])   . Smoker 09/16/2018   1 ppd  . Vitiligo     Current Outpatient Medications   Medication Sig Dispense Refill  . allopurinol (ZYLOPRIM) 100 MG tablet TAKE 1 TABLET BY MOUTH EVERY DAY 30 tablet 2  . ALPRAZolam (XANAX) 0.5 MG tablet Take 0.5 mg by mouth daily as needed for anxiety.     . colchicine 0.6 MG tablet Take 0.6 mg by mouth as needed (gout).    . fluticasone (FLONASE) 50 MCG/ACT nasal spray Place 2 sprays into both nostrils daily as needed for allergies.   11  . HYDROcodone-acetaminophen (NORCO) 10-325 MG tablet Take 0.5 tablets by mouth 2 (two) times daily as needed for moderate pain.     . macitentan (OPSUMIT) 10 MG tablet Take 1 tablet (10 mg total) by mouth daily. 90 tablet 3  . Multiple Vitamins-Minerals (MULTIVITAMIN WITH MINERALS) tablet Take 1 tablet by mouth once a week.    . sildenafil (REVATIO) 20 MG tablet Take 1 tablet (20 mg total) by mouth 3 (three) times daily. 90 tablet 12  . simvastatin (ZOCOR) 40 MG tablet Take 1 tablet (40 mg total) by mouth daily. Daily in the morning 30 tablet 11  . torsemide (DEMADEX) 20 MG tablet Take 20 mg by mouth daily.     No current facility-administered medications for this encounter.     No Known Allergies    Social History   Socioeconomic History  . Marital status: Married    Spouse name: [name]  . Number of children: 6  . Years of education: 82  . Highest education level: Not on file  Occupational History  . Occupation: Development worker, community  Social Needs  . Financial resource strain: Somewhat hard  . Food insecurity    Worry: Never true    Inability: Sometimes true  . Transportation needs    Medical: No    Non-medical: No  Tobacco Use  . Smoking status: Current Every Day Smoker    Packs/day: 1.00    Years: 38.00    Pack years: 38.00    Types: Cigarettes    Start date: 10/21/1971  . Smokeless tobacco: Never Used  Substance and Sexual Activity  . Alcohol use: Not Currently    Comment: social   . Drug use: No  . Sexual activity: Yes    Partners: Female  Lifestyle  . Physical activity     Days per week: Patient refused    Minutes per session: Patient refused  . Stress: Not on file  Relationships  . Social Herbalist on phone: Patient refused    Gets together: Patient refused    Attends religious service: Patient refused    Active member of club or organization: Patient refused    Attends meetings of clubs or organizations: Patient refused    Relationship status: Patient refused  . Intimate partner violence    Fear of current or ex partner: No    Emotionally abused: No    Physically abused: No    Forced sexual activity: No  Other Topics Concern  . Not on file  Social History Narrative   Lives with his wife and one daughter.      Family History  Problem Relation Age of Onset  . Lupus Mother   . Cancer Mother  unknown per wife  . Kidney failure Father   . Autoimmune disease Sister   . Colon cancer Neg Hx     Vitals:   01/29/19 1503  BP: 137/63  Pulse: 100  SpO2: 97%  Weight: 53.2 kg (117 lb 3.2 oz)    Wt Readings from Last 3 Encounters:  01/29/19 53.2 kg (117 lb 3.2 oz)  12/17/18 53.1 kg (117 lb 2 oz)  11/01/18 51.7 kg (114 lb)    PHYSICAL EXAM: General:Thin, weak appearing. No resp difficulty. HEENT: normal Neck: supple. JVP 8-9 Carotids 2+ bilat; no bruits. No lymphadenopathy or thryomegaly appreciated. Cor: PMI nondisplaced. Regular rate & rhythm. 2/6 TR Lungs: decreased breath sounds 1/3 up bilaterally Abdomen: soft, nontender, nondistended. No hepatosplenomegaly. No bruits or masses. Good bowel sounds. Extremities: no cyanosis, clubbing, rash, 2+ edema diffuse scleroderma changes Neuro: alert & orientedx3, cranial nerves grossly intact. moves all 4 extremities w/o difficulty. Affect pleasant   ASSESSMENT & PLAN:  1. Cough and progressive dyspnea - ReDS 55% - CXR with moderate bilateral effusions - Hard to know if this is all his effusions or also has a component of pulmonary edema.  - Has clear volume overload on exam  - Increase torsemide to 20 bid. Add kdur 20 bid - See back early next week with repeat CXR and echo - May need bilateral thoracenteses  +/- Pleurex tubes - If worse over weekend come to ER  2. Large recurrent pericardial effusion - s/p window in 2010 with Dr. [name] - suspect related to [facility] and high coronary sinus pressures - repeat echo. May need repeat window   3. 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 PA 81/26 (48), PCW 17, PVC 6.0 - Hard to follow O2 sats with peripheral changes but will need to ensure adequate oxygenation - Continue sildenafil 20 mg TID - Continue opsumit 10 mg daily - Consider repeat RHC in near future to add selexipag  4. 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 Continue torsemide - Continue spiro 25 mg daily  4. CAD - s/p previous RCA stent.  - LHC 07/01/18: Non obstructive CAD with patent RCA stent. - No s/s ischemia currently - Off ASA with GIB. Continue statin  5. Scleroderma - Follows with Dr. Estanislado Pandy   6. Tobacco use - smoking 3/4 ppd. Encouraged cessation. No change.  7. 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 - recent hgb 7.8.Probably exacerbating dyspnea   Total time spent 45 minutes. Over half that time spent discussing above.    Glori Bickers, MD  3:46 PM

## 2019-01-29 NOTE — Progress Notes (Signed)
ReDS Vest - 01/29/19 1500      ReDS Vest   MR   Moderate    Estimated volume prior to reading  High    Fitting Posture  Sitting    Height Marker  Short    Ruler Value  [place] C

## 2019-01-31 ENCOUNTER — Other Ambulatory Visit: Payer: Self-pay | Admitting: Rheumatology

## 2019-01-31 NOTE — Telephone Encounter (Signed)
Last Visit: 11/01/18 Next Visit: 04/04/19 Labs: 01/28/19 Creat. 2.0, BUN 37 Potassium 2.9, RBC 2.7, Hgb 7.8, Hct 26.1, MCHC 30 MPV 5.2 NEU % 80.7  Okay to refill Allopurinol?

## 2019-01-31 NOTE — Telephone Encounter (Signed)
Ok to refill

## 2019-02-03 ENCOUNTER — Ambulatory Visit (HOSPITAL_BASED_OUTPATIENT_CLINIC_OR_DEPARTMENT_OTHER)
Admission: RE | Admit: 2019-02-03 | Discharge: 2019-02-03 | Disposition: A | Discharge status: Home / Self Care | Payer: 59 | Source: Ambulatory Visit | Attending: Internal Medicine | Admitting: Internal Medicine

## 2019-02-03 ENCOUNTER — Ambulatory Visit (HOSPITAL_COMMUNITY)
Admission: RE | Admit: 2019-02-03 | Discharge: 2019-02-03 | Disposition: A | Discharge status: Home / Self Care | Payer: 59 | Source: Ambulatory Visit | Attending: Internal Medicine | Admitting: Internal Medicine

## 2019-02-03 ENCOUNTER — Other Ambulatory Visit: Payer: Self-pay

## 2019-02-03 ENCOUNTER — Encounter (HOSPITAL_COMMUNITY): Payer: Self-pay | Admitting: Internal Medicine

## 2019-02-03 VITALS — BP 138/66 | HR 88 | Wt 111.2 lb

## 2019-02-03 DIAGNOSIS — J90 Pleural effusion, not elsewhere classified: Secondary | ICD-10-CM

## 2019-02-03 DIAGNOSIS — I110 Hypertensive heart disease with heart failure: Secondary | ICD-10-CM | POA: Diagnosis not present

## 2019-02-03 DIAGNOSIS — D649 Anemia, unspecified: Secondary | ICD-10-CM | POA: Diagnosis not present

## 2019-02-03 DIAGNOSIS — F17200 Nicotine dependence, unspecified, uncomplicated: Secondary | ICD-10-CM

## 2019-02-03 DIAGNOSIS — I272 Pulmonary hypertension, unspecified: Secondary | ICD-10-CM

## 2019-02-03 DIAGNOSIS — F419 Anxiety disorder, unspecified: Secondary | ICD-10-CM | POA: Diagnosis not present

## 2019-02-03 DIAGNOSIS — I3139 Other pericardial effusion (noninflammatory): Secondary | ICD-10-CM

## 2019-02-03 DIAGNOSIS — I890 Lymphedema, not elsewhere classified: Secondary | ICD-10-CM | POA: Diagnosis not present

## 2019-02-03 DIAGNOSIS — R05 Cough: Secondary | ICD-10-CM | POA: Diagnosis not present

## 2019-02-03 DIAGNOSIS — I252 Old myocardial infarction: Secondary | ICD-10-CM | POA: Insufficient documentation

## 2019-02-03 DIAGNOSIS — I313 Pericardial effusion (noninflammatory): Secondary | ICD-10-CM | POA: Diagnosis not present

## 2019-02-03 DIAGNOSIS — I2510 Atherosclerotic heart disease of native coronary artery without angina pectoris: Secondary | ICD-10-CM | POA: Diagnosis not present

## 2019-02-03 DIAGNOSIS — M109 Gout, unspecified: Secondary | ICD-10-CM | POA: Diagnosis not present

## 2019-02-03 DIAGNOSIS — E039 Hypothyroidism, unspecified: Secondary | ICD-10-CM | POA: Diagnosis not present

## 2019-02-03 DIAGNOSIS — E785 Hyperlipidemia, unspecified: Secondary | ICD-10-CM | POA: Insufficient documentation

## 2019-02-03 DIAGNOSIS — Z955 Presence of coronary angioplasty implant and graft: Secondary | ICD-10-CM | POA: Diagnosis not present

## 2019-02-03 DIAGNOSIS — R0600 Dyspnea, unspecified: Secondary | ICD-10-CM | POA: Diagnosis not present

## 2019-02-03 DIAGNOSIS — I5022 Chronic systolic (congestive) heart failure: Secondary | ICD-10-CM

## 2019-02-03 DIAGNOSIS — Z79899 Other long term (current) drug therapy: Secondary | ICD-10-CM | POA: Insufficient documentation

## 2019-02-03 DIAGNOSIS — R0602 Shortness of breath: Secondary | ICD-10-CM

## 2019-02-03 DIAGNOSIS — M349 Systemic sclerosis, unspecified: Secondary | ICD-10-CM | POA: Insufficient documentation

## 2019-02-03 DIAGNOSIS — F17210 Nicotine dependence, cigarettes, uncomplicated: Secondary | ICD-10-CM | POA: Insufficient documentation

## 2019-02-03 LAB — BASIC METABOLIC PANEL
Anion gap: 14 (ref 5–15)
BUN: 50 mg/dL — ABNORMAL HIGH (ref 6–20)
CO2: 20 mmol/L — ABNORMAL LOW (ref 22–32)
Calcium: 8.8 mg/dL — ABNORMAL LOW (ref 8.9–10.3)
Chloride: 106 mmol/L (ref 98–111)
Creatinine, Ser: 2.23 mg/dL — ABNORMAL HIGH (ref 0.61–1.24)
GFR calc Af Amer: 36 mL/min — ABNORMAL LOW (ref >60)
GFR calc non Af Amer: 31 mL/min — ABNORMAL LOW (ref >60)
Glucose, Bld: 80 mg/dL (ref 70–99)
Potassium: 4.2 mmol/L (ref 3.5–5.1)
Sodium: 140 mmol/L (ref 135–145)

## 2019-02-03 LAB — CBC
HCT: 27.4 % — ABNORMAL LOW (ref 39.0–52.0)
Hemoglobin: 8.5 g/dL — ABNORMAL LOW (ref 13.0–17.0)
MCH: 29.6 pg (ref 26.0–34.0)
MCHC: 31 g/dL (ref 30.0–36.0)
MCV: 95.5 fL (ref 80.0–100.0)
Platelets: 232 10*3/uL (ref 150–400)
RBC: 2.87 MIL/uL — ABNORMAL LOW (ref 4.22–5.81)
RDW: 16.3 % — ABNORMAL HIGH (ref 11.5–15.5)
WBC: 8.2 10*3/uL (ref 4.0–10.5)
nRBC: 0 % (ref 0.0–0.2)

## 2019-02-03 LAB — BRAIN NATRIURETIC PEPTIDE: B Natriuretic Peptide: 185.8 pg/mL — ABNORMAL HIGH (ref 0.0–100.0)

## 2019-02-03 IMAGING — DX DG CHEST 2V
2 series · 2 of 2 positions shown · non-contrast
Comparison: Radiograph [DATE]

CLINICAL DATA: Pleural effusions, pt has had wet cough for
"weeks"pleural effusions

EXAM:
CHEST - 2 VIEW

[chest pa]
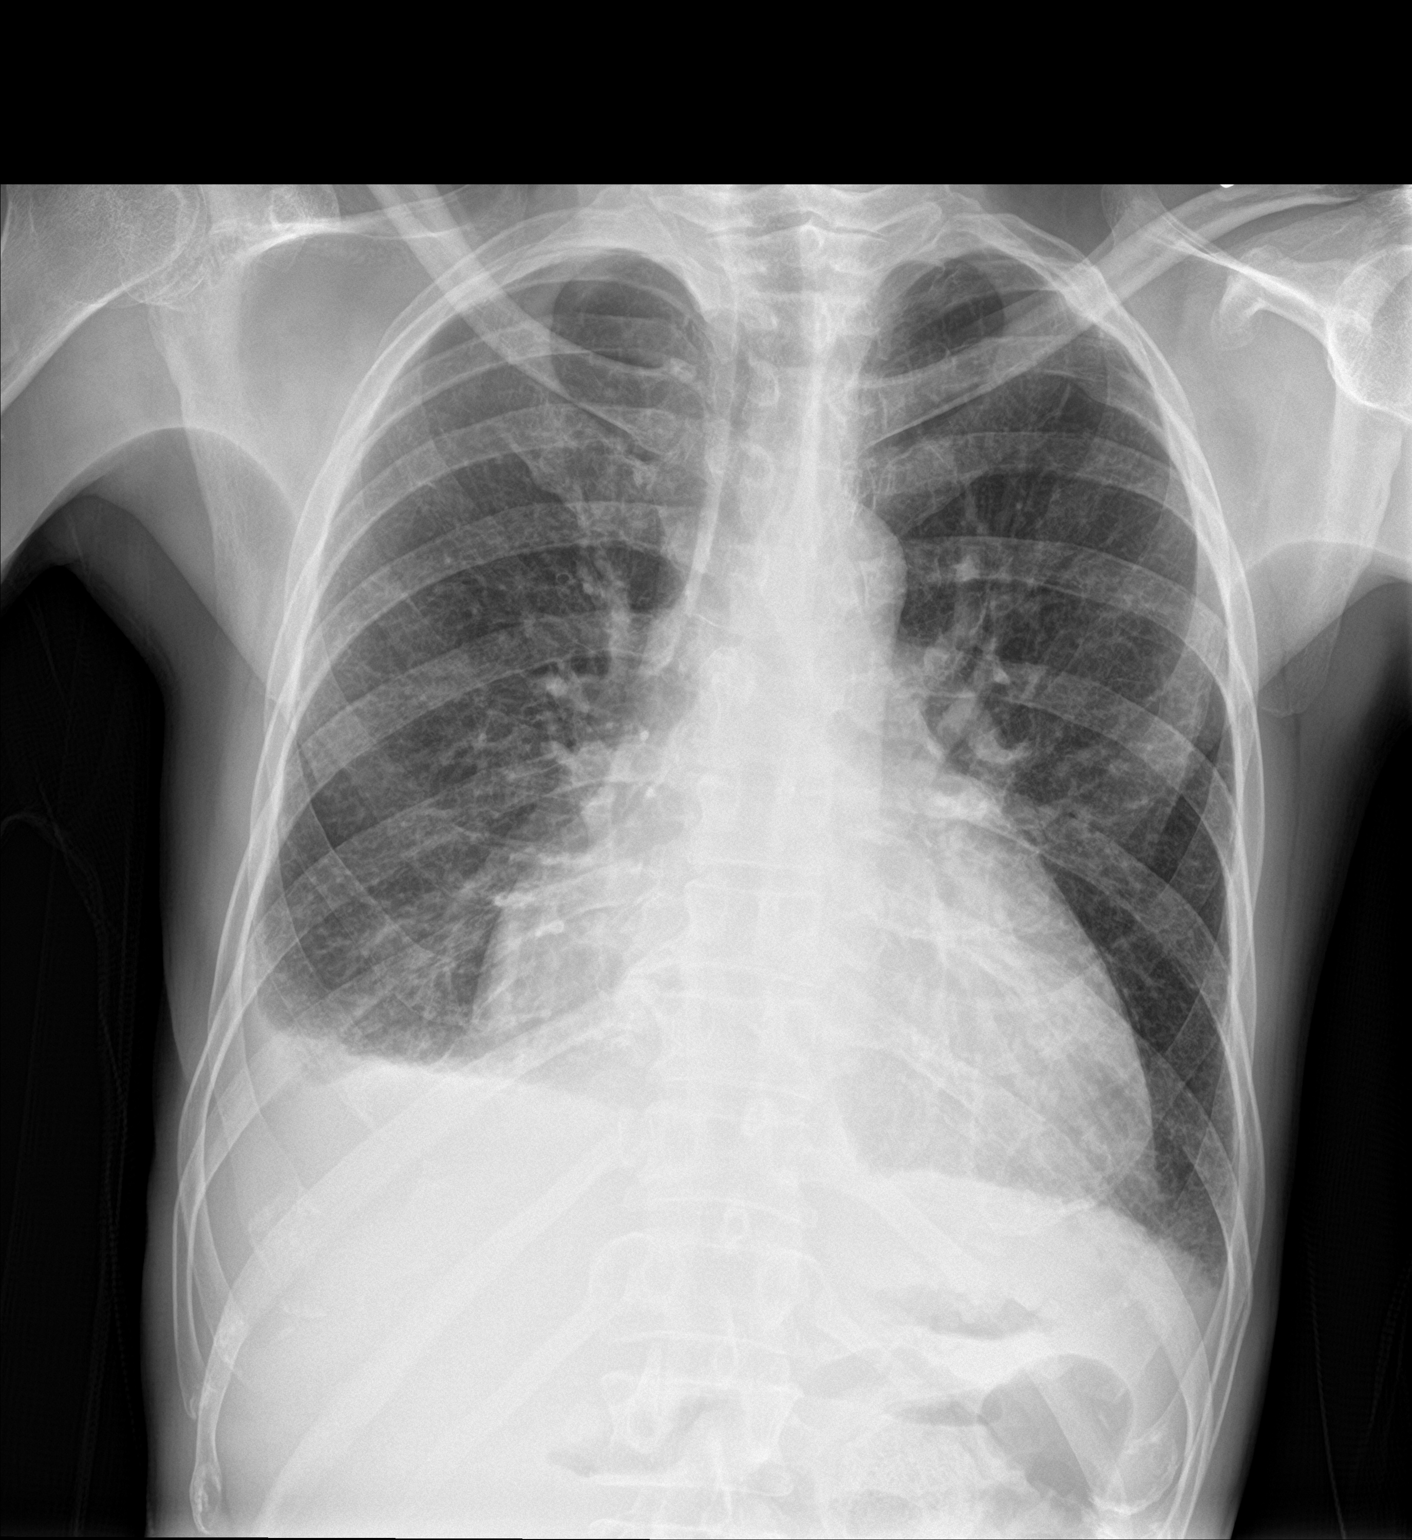

[chest lat]
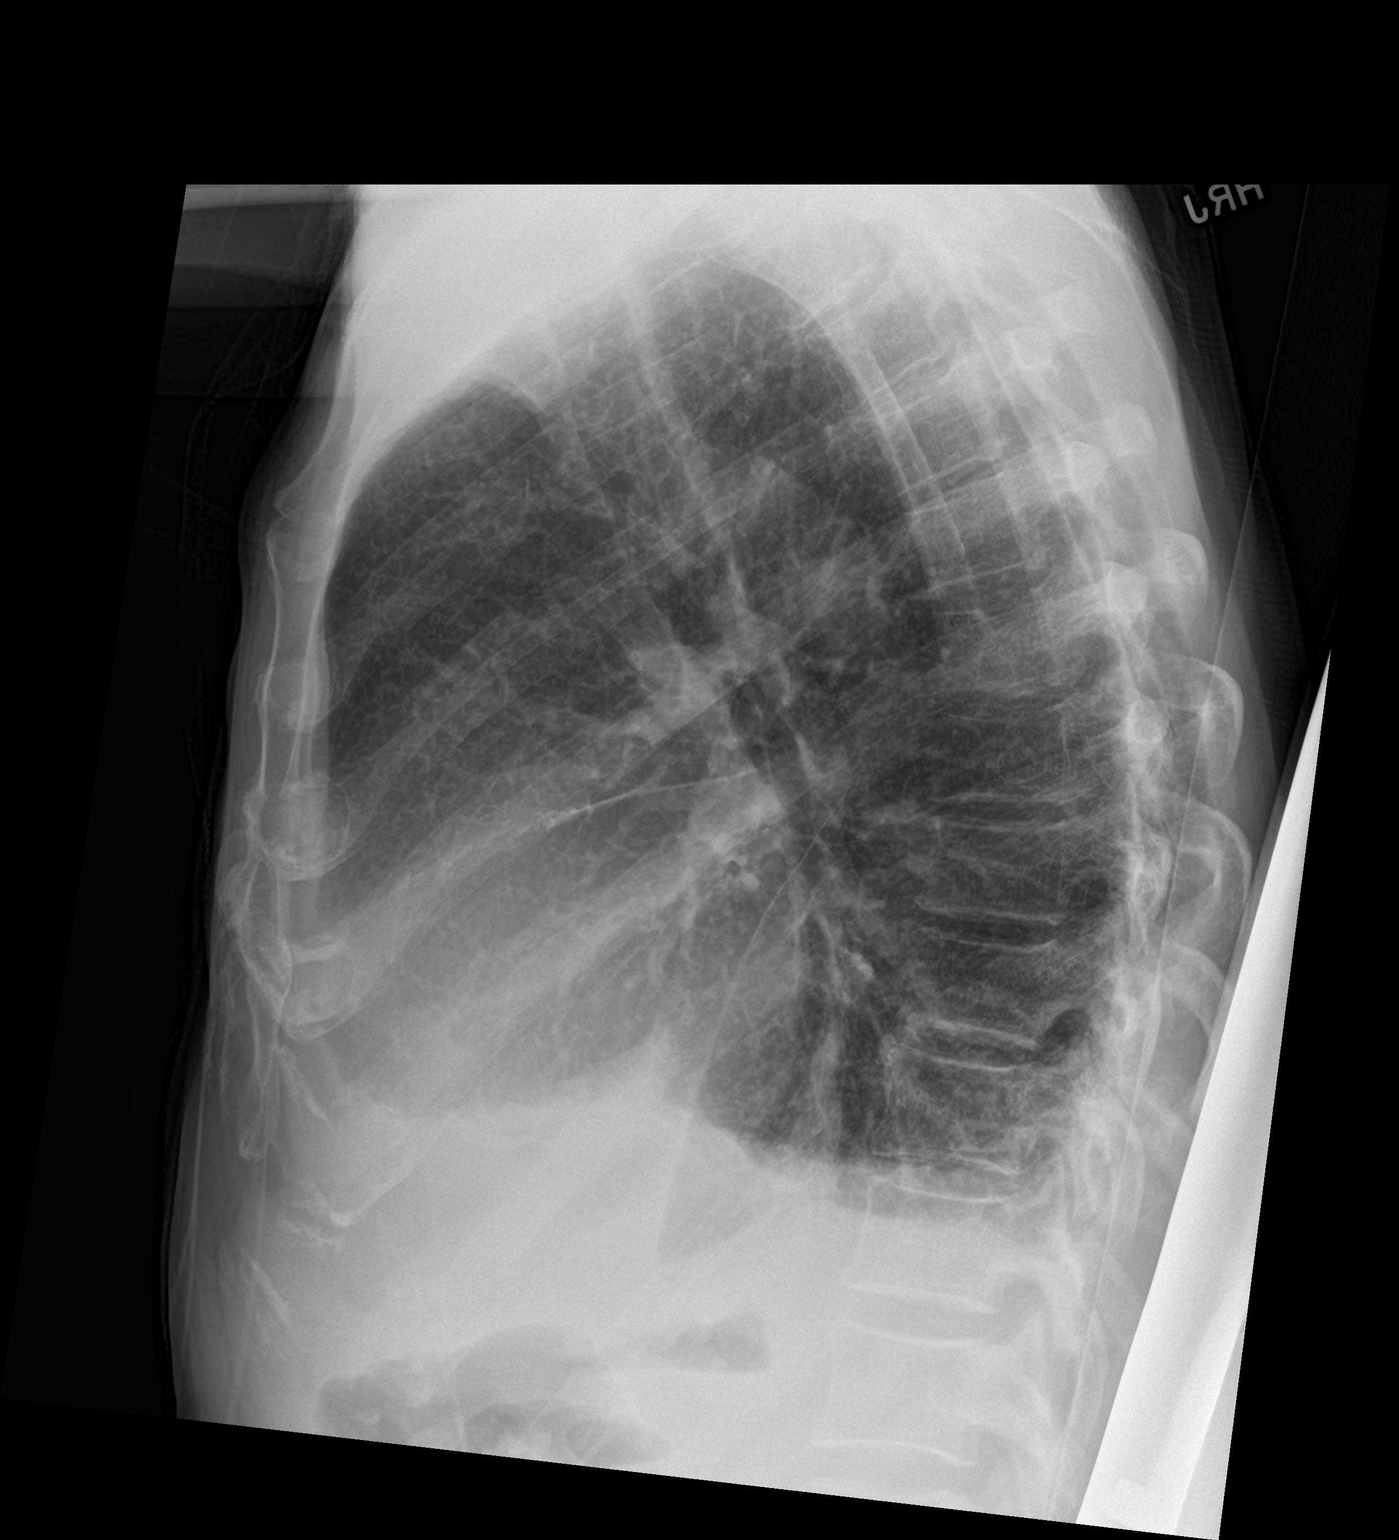

[2 of 2 positions shown; findings below may reference images not displayed]

FINDINGS: Globular enlarged heart. Interval increase in moderate RIGHT pleural
effusion. Small LEFT effusion. No overt pulmonary edema. No
pneumothorax.
IMPRESSION: Increasing bilateral pleural effusions, greater on the RIGHT.

## 2019-02-03 NOTE — Progress Notes (Signed)
ADVANCED HF CLINIC NOTE  Referring Physician: [name]  Primary Care: [name]: [name] [name]  HPI:  [name] is a 58 y.o.male with h/o scleroderma (diagnosed early 2000s), tobacco abuse, HTN, CAD s/p MI (s/p dRCA stent 2010) and previous pericardial effusion s/p pericardial window in 2010 by Dr. [name] who is referred by Dr. [name] due to recurrent pericardial effusion.   2-D [id number] normal LV systolic function, grade 2 diastolic dysfunction with moderate pericardial effusion without evidence of pericardial tamponade.  He was seen in [facility] office on 01/15/2018 due to increasing shortness of breath. 2D echo showed normal LV systolic function with small nonhemodynamically significant pericardial effusion unchanged from last echo. Myoview stress test was normal as well. His hydrochlorthiazide was changed to torsemide.   He saw Dr. [name] in February with worsening SOB and echo done 06/13/18 showed LVEF 40-45% (I felt EF 50-55%)with severe RV dysfunction, RVSP > 187mHG and large pericardial effusion. His diuretics were increased. He has subsequently seen Dr. HRoxan Hockeywho felt his effusion was likely related to his scleroderma and felt he would benefit from medical therapy prior to repeat surgical pericardia drainage which would require VATS.   CXR 06/11/18: Bilateral pleural effusions. No mass  Seen in HF clinic for first time 07/01/18. [facility] to severe [facility]. Started on sildenafil and opsumit.  In early May found to have hgb 4.6 Got 2u RBC. Endoscopy showed gastritis, congestive gastropathy, duodenal polyp biopsied, duodenal mucosa lymphangiectasia. Colonoscopy; 12 mm polyp removed from transverse colon, multiples bleeding colonicangioectasistreated with argon plasma coagulation. Readmitted 2 weeks later with SBP 60s, Initial Hgb 6.7 with additional AKI with sCr of 2.51Seen on CTA to have findings concerning for  active hemorrhage, possibly due to previously noted atrial venous malformations.  Gastroenterology and Radiology IR were consulted.  Patient underwent coil embolization of the cecal artery on 5/23 in IR.  Patient returned to ICU for monitoring.  He had no further hematochezia and Hgb counts remained stable. Subsequently saw GI as outpatient. Had capsule endo which was unrevealing.   We saw him last week for progressive volume overload. CXR with bilateral moderate pleural effusions. Hgb 7.8. We increased torsemide to 20 bid and had him come back today with echo.   Feels much better. Has  Lost 4 pounds. Less SOB. Cough improved. Echo today with normal LV function but persistent large pericardial effusion. Thre is some RA compression but no overt tamponade. RV moderately HK. RVSP ~40. IVC 1.5cm  R/LHC 07/01/18  Prox RCA to Mid RCA lesion is 40% stenosed.  Mid RCA lesion is 40% stenosed.  Previously placed Mid RCA to Dist RCA stent (unknown type) is widely patent.  Acute Mrg lesion is 95% stenosed.  Prox LAD lesion is 30% stenosed.  Mid LAD to Dist LAD lesion is 40% stenosed. Findings: Ao = 140/77 (100) LV = 145/14 RA = 16 RV = 79/18 PA = 81/26 (48) PCW = 17 Fick cardiac output/index = 4.6/2.7 PVR = 6.0 WU Ao sat = 97% PA sat = 61%, 64% No RV-LV interaction on simultaneous pressure waveforms with deep breathing Assessment: 1. Non-obstructive CAD with patent RCA stent 2. Moderate to severe PAH 3. No evidence of tamponade   Past Medical History:  Diagnosis Date   Anxiety    CAD (coronary artery disease)    Gout    Hyperlipidemia    Hypertension 05/14/2012    Lexiscan-- EF 51% ,LV normal   Hypothyroid  MI (myocardial infarction) ([place]) 2010   Pericardial effusion 12/2008   Dr [name] performed a subxiphoid window removing 221m of fluid   Pericardial effusion 03/09/2010   Echo-LVEF >55%, very small pericardial effusion ,,Stage 1 (impaired ) diastolic fxn, elevated  LV filling   Pericarditis    Raynaud's phenomenon    [place]    Smoker 09/16/2018   1 ppd   Vitiligo     Current Outpatient Medications  Medication Sig Dispense Refill   allopurinol (ZYLOPRIM) 100 MG tablet TAKE 1 TABLET BY MOUTH EVERY DAY 30 tablet 2   ALPRAZolam (XANAX) 0.5 MG tablet Take 0.5 mg by mouth daily as needed for anxiety.      colchicine 0.6 MG tablet Take 0.6 mg by mouth as needed (gout).     fluticasone (FLONASE) 50 MCG/ACT nasal spray Place 2 sprays into both nostrils daily as needed for allergies.   11   HYDROcodone-acetaminophen (NORCO) 10-325 MG tablet Take 0.5 tablets by mouth 2 (two) times daily as needed for moderate pain.      macitentan (OPSUMIT) 10 MG tablet Take 1 tablet (10 mg total) by mouth daily. 90 tablet 3   Multiple Vitamins-Minerals (MULTIVITAMIN WITH MINERALS) tablet Take 1 tablet by mouth once a week.     potassium chloride SA (KLOR-CON) 20 MEQ tablet Take 1 tablet (20 mEq total) by mouth 2 (two) times daily. 60 tablet 3   sildenafil (REVATIO) 20 MG tablet Take 1 tablet (20 mg total) by mouth 3 (three) times daily. 90 tablet 12   simvastatin (ZOCOR) 40 MG tablet Take 1 tablet (40 mg total) by mouth daily. Daily in the morning 30 tablet 11   torsemide (DEMADEX) 20 MG tablet Take 1 tablet (20 mg total) by mouth 2 (two) times daily. 60 tablet 6   No current facility-administered medications for this encounter.     No Known Allergies    Social History   Socioeconomic History   Marital status: Married    Spouse name: SIvin Booty  Number of children: 6   Years of education: 9   Highest education level: Not on file  Occupational History   Occupation: HDevelopment worker, community [employer]   Financial resource strain: Somewhat hard   Food insecurity    Worry: Never true    Inability: Sometimes true   Transportation needs    Medical: No    Non-medical: No  Tobacco Use   Smoking status: Current Every Day Smoker      Packs/day: 1.00    Years: 38.00    Pack years: 38.00    Types: Cigarettes    Start date: 10/21/1971   Smokeless tobacco: Never Used  Substance and Sexual Activity   Alcohol use: Not Currently    Comment: social    Drug use: No   Sexual activity: Yes    Partners: Female  Lifestyle   Physical activity    Days per week: Patient refused    Minutes per session: Patient refused   Stress: Not on file  Relationships   Social connections    Talks on phone: Patient refused    Gets together: Patient refused    Attends religious service: Patient refused    Active member of club or organization: Patient refused    Attends meetings of clubs or organizations: Patient refused    Relationship status: Patient refused   Intimate partner violence    Fear of current or ex partner: No    Emotionally abused: No  Physically abused: No    Forced sexual activity: No  Other Topics Concern   Not on file  Social History Narrative   Lives with his wife and one daughter.      Family History  Problem Relation Age of Onset   Lupus Mother    Cancer Mother        unknown per wife   Kidney failure Father    Autoimmune disease Sister    Colon cancer Neg Hx     Vitals:   02/03/19 0946  BP: 138/66  Pulse: 88  SpO2: 94%  Weight: 50.4 kg (111 lb 3.2 oz)    Wt Readings from Last 3 Encounters:  02/03/19 50.4 kg (111 lb 3.2 oz)  01/29/19 53.2 kg (117 lb 3.2 oz)  12/17/18 53.1 kg (117 lb 2 oz)    PHYSICAL EXAM: General:  Thin male No resp difficulty HEENT: normal Neck: supple. JVP 4-5. Carotids 2+ bilat; no bruits. No lymphadenopathy or thryomegaly appreciated. Cor: PMI nondisplaced. Regular rate & rhythm. 2/6 TR/ & AS. Lungs: clear decreased at right base  Abdomen: soft, nontender, nondistended. No hepatosplenomegaly. No bruits or masses. Good bowel sounds. Extremities: no cyanosis, clubbing, rash, edema diffuse scleroderma changes Neuro: alert & orientedx3, cranial nerves  grossly intact. moves all 4 extremities w/o difficulty. Affect pleasant  CXR with moderate R effusion and small left effusion   ASSESSMENT & PLAN:  1. Cough and progressive dyspnea - Much improved with diuresis and increased torsemide dosing. - Continue torsemide 20 bid.  - Check labs today  2. Large recurrent pericardial effusion and left pleural effusion  - s/p window in 2010 with Dr. [name] - suspect related to [facility] and high coronary sinus pressures - Echo today with persistent large effusion. No overt tamponade. Will refer back to Dr. [name]. For possible repeat window and R chest rube  3. 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 PA 81/26 (48), PCW 17, PVC 6.0 - Hard to follow O2 sats with peripheral changes but will need to ensure adequate oxygenation - Continue sildenafil 20 mg TID - Continue opsumit 10 mg daily - Pressures seem improved on echo today. Will need to consider repeat RHC in near future to add selexipag  4. RV failure due to cor pulmonale  - in reviewing echo personally LVEF looks normal but there is moderate RV dysfunction in setting of PAH - NYHA II-III  - Volume status ok. Continue torsemide 20 bid - Continue spiro 25 mg daily - Check labs today  4. CAD - s/p previous RCA stent.  - LHC 07/01/18: Non obstructive CAD with patent RCA stent. - No s/s ischemia currently - Off ASA with GIB. Continue statin  5. Scleroderma - Follows with Dr. Estanislado Pandy   6. Tobacco use - smoking 3/4 ppd. Encouraged cessation. No change.  7. 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 - recent hgb 7.8.Probably exacerbating dyspnea  - recheck today   Glori Bickers, MD  10:06 AM

## 2019-02-03 NOTE — Patient Instructions (Signed)
Labs today We will only contact you if something comes back abnormal or we need to make some changes. Otherwise no news is good news!  Chest Xray will be done today.  Our office will call you with the results  We have reached out to Dr Hendrickson's office to arrange an appointment for you to be seen this week.  You will get a call with that information.  Your physician recommends that you schedule a follow-up appointment in: 2 months with Dr Haroldine Laws  At the [facility], you and your health needs are our priority. As part of our continuing mission to provide you with exceptional heart care, we have created designated Provider Care Teams. These Care Teams include your primary Cardiologist (physician) and Advanced Practice Providers (APPs- Physician Assistants and Nurse Practitioners) who all work together to provide you with the care you need, when you need it.   You may see any of the following providers on your designated Care Team at your next follow up: [name] Dr Glori Bickers . Dr Loralie Champagne . Darrick Grinder, NP . Lyda Jester, PA   Please be sure to bring in all your medications bottles to every appointment.   a

## 2019-02-03 NOTE — Progress Notes (Signed)
  Echocardiogram 2D Echocardiogram has been performed.  [name] 02/03/2019, 9:44 AM

## 2019-02-04 ENCOUNTER — Encounter: Payer: Self-pay | Admitting: Thoracic Surgery (Cardiothoracic Vascular Surgery)

## 2019-02-04 ENCOUNTER — Other Ambulatory Visit: Payer: Self-pay | Admitting: *Deleted

## 2019-02-04 ENCOUNTER — Ambulatory Visit: Payer: 59 | Admitting: Thoracic Surgery (Cardiothoracic Vascular Surgery)

## 2019-02-04 VITALS — BP 122/78 | HR 100 | Temp 98.1°F | Resp 16 | Ht 68.0 in | Wt 110.0 lb

## 2019-02-04 DIAGNOSIS — I313 Pericardial effusion (noninflammatory): Secondary | ICD-10-CM | POA: Diagnosis not present

## 2019-02-04 DIAGNOSIS — J90 Pleural effusion, not elsewhere classified: Secondary | ICD-10-CM | POA: Diagnosis not present

## 2019-02-04 DIAGNOSIS — I3139 Other pericardial effusion (noninflammatory): Secondary | ICD-10-CM
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
## 2019-02-06 NOTE — Progress Notes (Signed)
[id number] [id number]:: [phone]:: [phone]     Your procedure is scheduled on Monday, Oct. 19th.  [facility].M., and check in at the Admitting office.  Call this number if you have problems the morning of surgery:  [phone] Call [phone] you have any questions prior to your surgery date Monday-Friday 8am-4pm    Remember:  Do not eat or drink after midnight the night before your surgery   Take these medicines the morning of surgery with A SIP OF WATER   Allopurinol (Zyloprim)  Alprazolam (Xanax)  Flonase nasal spray - if needed  Hydrocodone-Acetaminophen - if needed  Simvastatin (Zocor)  7 days prior to surgery STOP taking any Aspirin (unless otherwise instructed by your surgeon), Aleve, Naproxen, Ibuprofen, Motrin, Advil, Goody's, BC's, all herbal medications, fish oil, and all vitamins.    The Morning of Surgery  Do not wear jewelry.  Do not wear lotions, powders, colognes, or deodorant  Men may shave face and neck.  Do not bring valuables to the hospital.  [facility] belongings or [facility].  If you are a smoker, DO NOT Smoke 24 hours prior to surgery IF you wear a CPAP at night please bring your mask, tubing, and machine the morning of surgery   Remember that you must have someone to transport you home after your surgery, and remain with you for 24 hours if you are discharged the same day.   Contacts, glasses, hearing aids, dentures or bridgework may not be worn into surgery.    Leave your suitcase in the car.  After surgery it may be brought to your room.  For patients admitted to the hospital, discharge time will be determined by your treatment team.  Patients discharged the day of surgery will not be allowed to drive home.    Special instructions:   [facility]- Preparing For  Surgery  Before surgery, you can play an important role. Because skin is not sterile, your skin needs to be as free of germs as possible. You can reduce the number of germs on your skin by washing with CHG (chlorahexidine gluconate) Soap before surgery.  CHG is an antiseptic cleaner which kills germs and bonds with the skin to continue killing germs even after washing.    Oral Hygiene is also important to reduce your risk of infection.  Remember - BRUSH YOUR TEETH THE MORNING OF SURGERY WITH YOUR REGULAR TOOTHPASTE  Please do not use if you have an allergy to CHG or antibacterial soaps. If your skin becomes reddened/irritated stop using the CHG.  Do not shave (including legs and underarms) for at least 48 hours prior to first CHG shower. It is OK to shave your face.  Please follow these instructions carefully.   1. Shower the NIGHT BEFORE SURGERY and the MORNING OF SURGERY with CHG Soap.   2. If you chose to wash your hair, wash your hair first as usual with your normal shampoo.  3. After you shampoo, rinse your hair and body thoroughly to remove the shampoo.  4. Use CHG as you would any other liquid soap. You can apply CHG directly to the skin and wash gently with a scrungie or a clean washcloth.   5. Apply the CHG Soap to your body ONLY FROM THE NECK DOWN.  Do not use on open wounds or  open sores. Avoid contact with your eyes, ears, mouth and genitals (private parts). Wash Face and genitals (private parts)  with your normal soap.   6. Wash thoroughly, paying special attention to the area where your surgery will be performed.  7. Thoroughly rinse your body with warm water from the neck down.  8. DO NOT shower/wash with your normal soap after using and rinsing off the CHG Soap.  9. Pat yourself dry with a CLEAN TOWEL.  10. Wear CLEAN PAJAMAS to bed the night before surgery, wear comfortable clothes the morning of surgery  11. Place CLEAN SHEETS on your bed the night of your first  shower and DO NOT SLEEP WITH PETS.    Day of Surgery:  Do not apply any deodorants/lotions. Please shower the morning of surgery with the CHG soap  Please wear clean clothes to the hospital/surgery center.   Remember to brush your teeth WITH YOUR REGULAR TOOTHPASTE.   Please read over the following fact sheets that you were given.

## 2019-02-07 ENCOUNTER — Encounter (HOSPITAL_COMMUNITY): Payer: Self-pay

## 2019-02-07 ENCOUNTER — Ambulatory Visit (HOSPITAL_COMMUNITY)
Admission: RE | Admit: 2019-02-07 | Discharge: 2019-02-07 | Disposition: A | Discharge status: Home / Self Care | Payer: 59 | Source: Ambulatory Visit | Attending: Thoracic Surgery (Cardiothoracic Vascular Surgery) | Admitting: Thoracic Surgery (Cardiothoracic Vascular Surgery)

## 2019-02-07 ENCOUNTER — Other Ambulatory Visit: Payer: Self-pay

## 2019-02-07 ENCOUNTER — Other Ambulatory Visit (HOSPITAL_COMMUNITY)
Admission: RE | Admit: 2019-02-07 | Discharge: 2019-02-07 | Disposition: A | Discharge status: Home / Self Care | Payer: 59 | Source: Ambulatory Visit | Attending: Thoracic Surgery (Cardiothoracic Vascular Surgery) | Admitting: Thoracic Surgery (Cardiothoracic Vascular Surgery)

## 2019-02-07 ENCOUNTER — Encounter (HOSPITAL_COMMUNITY)
Admission: RE | Admit: 2019-02-07 | Discharge: 2019-02-07 | Disposition: A | Discharge status: Home / Self Care | Payer: 59 | Source: Ambulatory Visit | Attending: Thoracic Surgery (Cardiothoracic Vascular Surgery) | Admitting: Thoracic Surgery (Cardiothoracic Vascular Surgery)

## 2019-02-07 DIAGNOSIS — I313 Pericardial effusion (noninflammatory): Secondary | ICD-10-CM

## 2019-02-07 DIAGNOSIS — I3139 Other pericardial effusion (noninflammatory): Secondary | ICD-10-CM

## 2019-02-07 DIAGNOSIS — J90 Pleural effusion, not elsewhere classified: Secondary | ICD-10-CM

## 2019-02-07 DIAGNOSIS — Z01818 Encounter for other preprocedural examination: Secondary | ICD-10-CM | POA: Insufficient documentation

## 2019-02-07 DIAGNOSIS — Z20828 Contact with and (suspected) exposure to other viral communicable diseases: Secondary | ICD-10-CM | POA: Insufficient documentation

## 2019-02-07 LAB — URINALYSIS, ROUTINE W REFLEX MICROSCOPIC
Bacteria, UA: NONE SEEN
Bilirubin Urine: NEGATIVE
Glucose, UA: NEGATIVE mg/dL
Hgb urine dipstick: NEGATIVE
Ketones, ur: NEGATIVE mg/dL
Leukocytes,Ua: NEGATIVE
Nitrite: NEGATIVE
Protein, ur: 30 mg/dL — AB
Specific Gravity, Urine: 1.008 (ref 1.005–1.030)
pH: 5 (ref 5.0–8.0)

## 2019-02-07 LAB — CBC
HCT: 26.3 % — ABNORMAL LOW (ref 39.0–52.0)
Hemoglobin: 8.4 g/dL — ABNORMAL LOW (ref 13.0–17.0)
MCH: 29.3 pg (ref 26.0–34.0)
MCHC: 31.9 g/dL (ref 30.0–36.0)
MCV: 91.6 fL (ref 80.0–100.0)
Platelets: 292 10*3/uL (ref 150–400)
RBC: 2.87 MIL/uL — ABNORMAL LOW (ref 4.22–5.81)
RDW: 16.4 % — ABNORMAL HIGH (ref 11.5–15.5)
WBC: 9.6 10*3/uL (ref 4.0–10.5)
nRBC: 0 % (ref 0.0–0.2)

## 2019-02-07 LAB — BLOOD GAS, ARTERIAL
Acid-base deficit: 2.2 mmol/L — ABNORMAL HIGH (ref 0.0–2.0)
Bicarbonate: 21 mmol/L (ref 20.0–28.0)
Drawn by: 265211
FIO2: 0.21
O2 Saturation: 97.2 %
Patient temperature: 98.6
pCO2 arterial: 29.5 mmHg — ABNORMAL LOW (ref 32.0–48.0)
pH, Arterial: 7.466 — ABNORMAL HIGH (ref 7.350–7.450)
pO2, Arterial: 93.5 mmHg (ref 83.0–108.0)

## 2019-02-07 LAB — APTT: aPTT: 38 seconds — ABNORMAL HIGH (ref 24–36)

## 2019-02-07 LAB — COMPREHENSIVE METABOLIC PANEL
ALT: 40 U/L (ref 0–44)
AST: 68 U/L — ABNORMAL HIGH (ref 15–41)
Albumin: 2.8 g/dL — ABNORMAL LOW (ref 3.5–5.0)
Alkaline Phosphatase: 98 U/L (ref 38–126)
Anion gap: 12 (ref 5–15)
BUN: 72 mg/dL — ABNORMAL HIGH (ref 6–20)
CO2: 19 mmol/L — ABNORMAL LOW (ref 22–32)
Calcium: 8.8 mg/dL — ABNORMAL LOW (ref 8.9–10.3)
Chloride: 104 mmol/L (ref 98–111)
Creatinine, Ser: 2.85 mg/dL — ABNORMAL HIGH (ref 0.61–1.24)
GFR calc Af Amer: 27 mL/min — ABNORMAL LOW (ref >60)
GFR calc non Af Amer: 23 mL/min — ABNORMAL LOW (ref >60)
Glucose, Bld: 85 mg/dL (ref 70–99)
Potassium: 4.5 mmol/L (ref 3.5–5.1)
Sodium: 135 mmol/L (ref 135–145)
Total Bilirubin: 0.5 mg/dL (ref 0.3–1.2)
Total Protein: 7.2 g/dL (ref 6.5–8.1)

## 2019-02-07 LAB — SURGICAL PCR SCREEN
MRSA, PCR: NEGATIVE
Staphylococcus aureus: NEGATIVE

## 2019-02-07 LAB — PROTIME-INR
INR: 1.3 — ABNORMAL HIGH (ref 0.8–1.2)
Prothrombin Time: 15.7 seconds — ABNORMAL HIGH (ref 11.4–15.2)

## 2019-02-07 IMAGING — CR DG CHEST 2V
2 series · 2 of 2 positions shown · non-contrast
Comparison: [DATE]

CLINICAL DATA: Preoperative, right effusion

EXAM:
CHEST - 2 VIEW

[w chest pa]
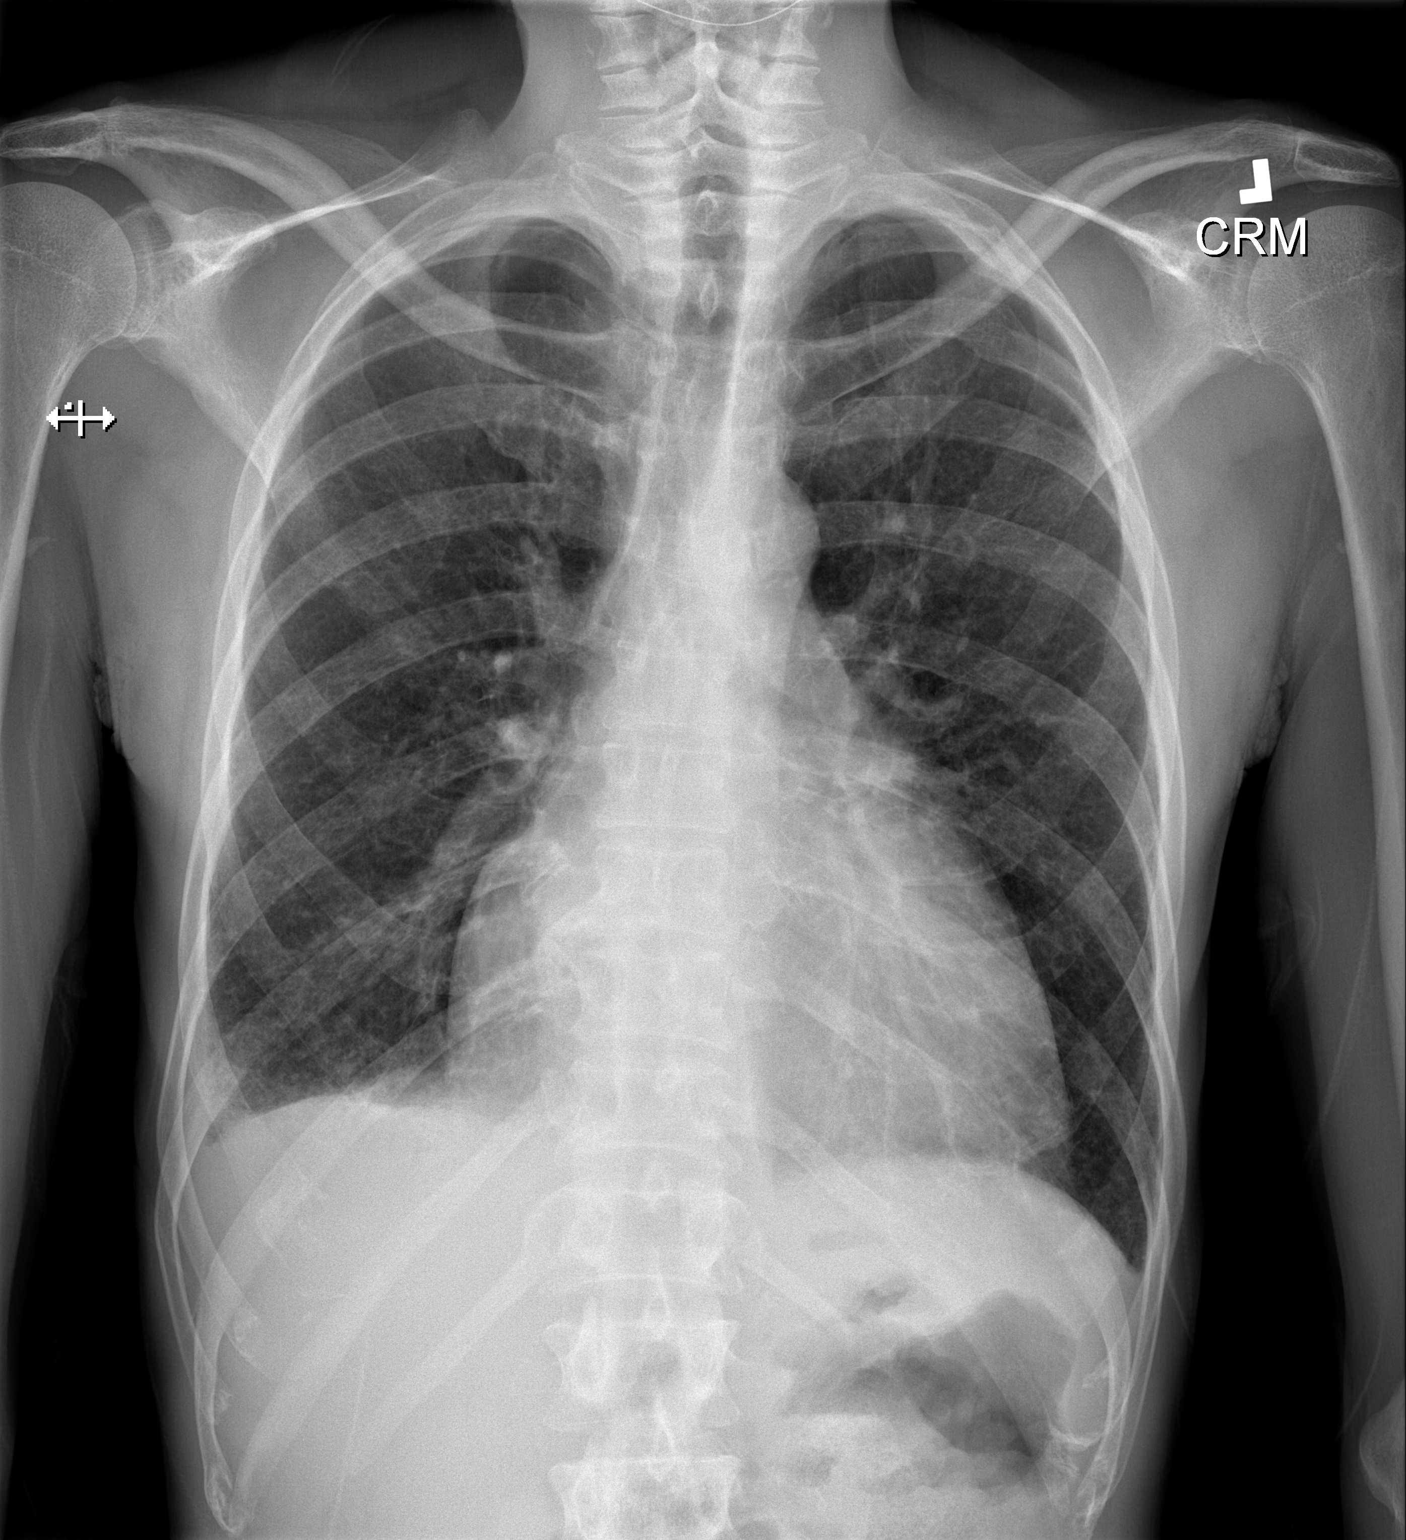

[w chest lat]
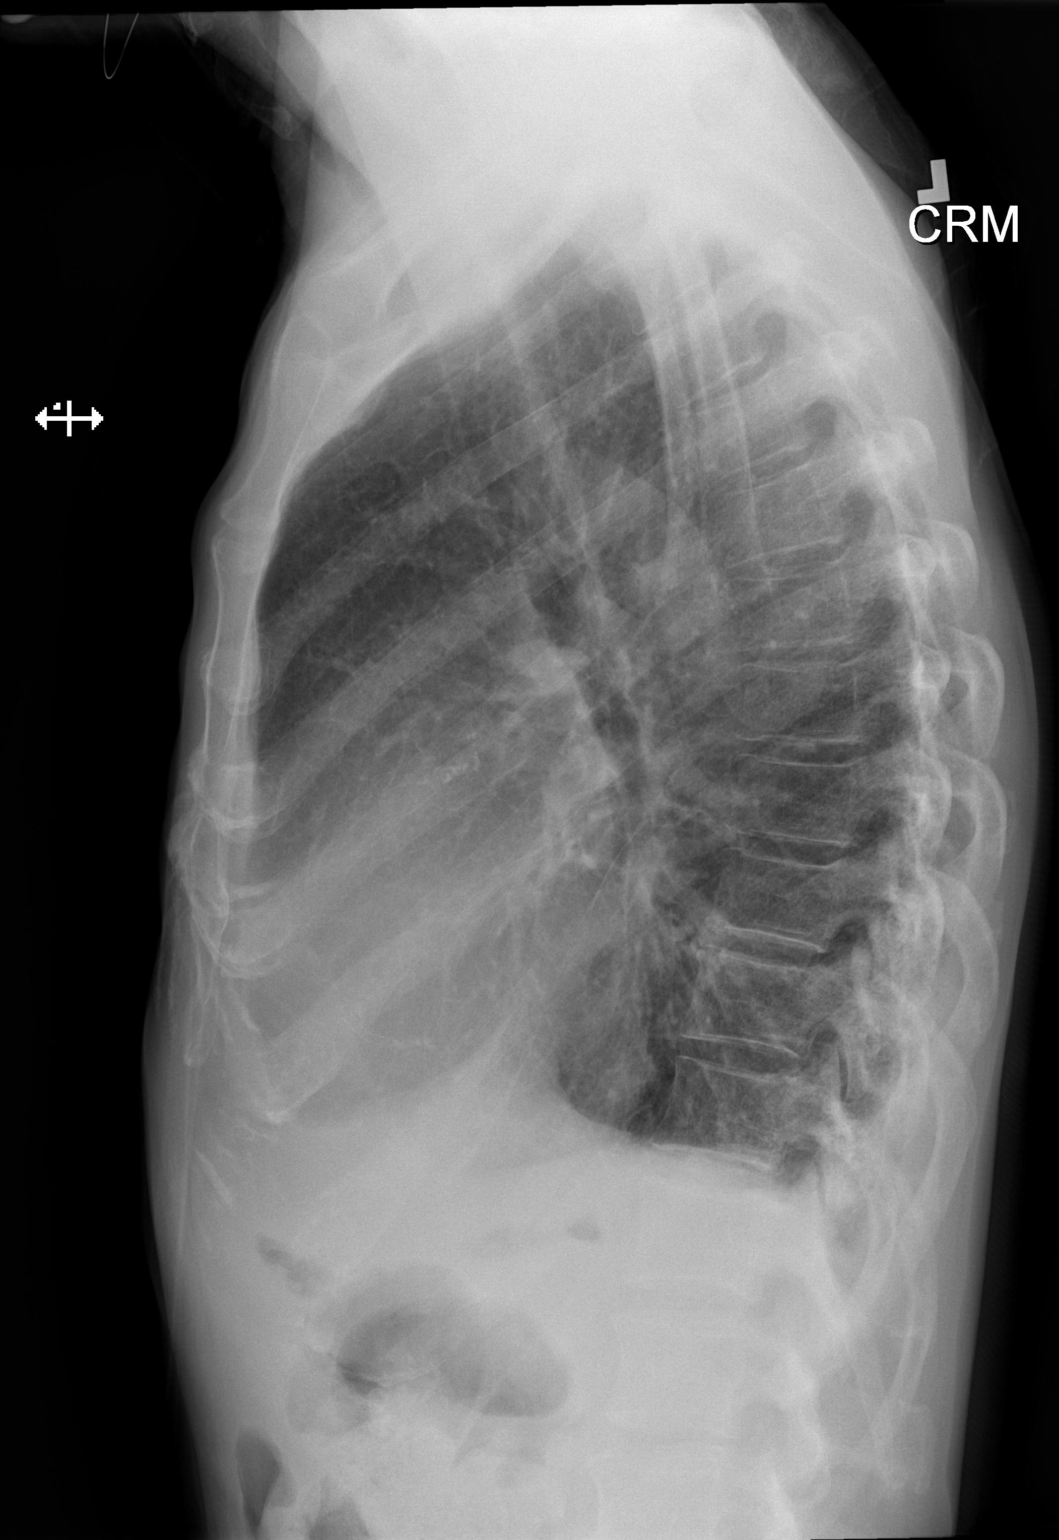

[2 of 2 positions shown; findings below may reference images not displayed]

FINDINGS: Cardiomegaly. Unchanged small right pleural effusion. The visualized
skeletal structures are unremarkable.
IMPRESSION: 1. Unchanged small right pleural effusion. No new airspace opacity.

2.  Cardiomegaly.

## 2019-02-07 NOTE — Progress Notes (Signed)
Anesthesia Chart Review: H/o scleroderma with sclerodactyly and raynaud's (diagnosed early 2000s), CAD s/p MI (s/p dRCA stent 2010), pulmonary HTN and previous pericardial effusion s/p pericardial window in 2010 - now with recurrent effusion. [facility] 06/30/17 showed mod to severe PAH. Started on sildenafil and opsumit. Last seen by Dr. Haroldine Laws 02/03/19, per OV note "We saw him last week for progressive volume overload. CXR with bilateral moderate pleural effusions. Hgb 7.8. We increased torsemide to 20 bid and had him come back today with echo. Feels much better. Has  Lost 4 pounds. Less SOB. Cough improved. Echo today with normal LV function but persistent large pericardial effusion. Thre is some RA compression but no overt tamponade. RV moderately HK. RVSP ~40. IVC 1.5cm." Pt was referred back to Dr. [name] for possible repeat window and R chest tube.  Follows with Dr. Joelyn Oms for CKD III, recently worsened creatinine due to use of diuretics for treatment of edema. Recent baseline creatinine appears to be ~2.20. Preop labs with creatinine 2.85. Per Dr. Leonarda Salon note, part of the impetus to proceed with surgery is to hopefully reduce need for diuretics in setting of worsened renal function.  TTE 02/03/19: 1. Left ventricular ejection fraction, by visual estimation, is 55 to 60%. The left ventricle has normal function. Normal left ventricular size. Left ventricular septal wall thickness was mildly increased. Mildly increased left ventricular posterior  wall thickness. There is mildly increased left ventricular hypertrophy.  2. Elevated left ventricular end-diastolic pressure.  3. Left ventricular diastolic Doppler parameters are consistent with pseudonormalization pattern of LV diastolic filling.  4. Global right ventricle mild-moderately reduced.The right ventricular size is normal. No increase in right ventricular wall thickness.  5. Left atrial size was moderately dilated.  6. Right atrial  size was normal.  7. Large pericardial effusion.  8. The pericardial effusion is circumferential.  9. There is inversion of the right ventricular wall. 10. Large circumferential pericarcial effusion measuring up to 2.71 cm. Comapred with the echo 05/2018, pericardial effusion is unchanged. 11. The mitral valve is normal in structure. Trace mitral valve regurgitation. No evidence of mitral stenosis. 12. The tricuspid valve is normal in structure. Tricuspid valve regurgitation is mild. 13. The aortic valve is tricuspid Aortic valve regurgitation was not visualized by color flow Doppler. Structurally normal aortic valve, with no evidence of sclerosis or stenosis. 14. The pulmonic valve was normal in structure. Pulmonic valve regurgitation is not visualized by color flow Doppler. 15. Moderately elevated pulmonary artery systolic pressure. 16. The inferior vena cava is normal in size with <50% respiratory variability, suggesting right atrial pressure of 8 mmHg.   Wynonia Musty [facility]/Anesthesiology Phone [phone] 02/07/2019 12:10 PM

## 2019-02-07 NOTE — Progress Notes (Signed)
Abnormal labs resulted, Dr. [name] made aware via [employer].

## 2019-02-07 NOTE — Progress Notes (Signed)
PCP - Dr. [name] Cardiologist - Dr. Glori Bickers  Chest x-ray - 02/07/2019 EKG - 01/29/2019 Stress Test - 01/30/2019 ECHO - 02/03/2019 Cardiac Cath - 07/01/2018  Sleep Study - Yes, per patient 10 years ago, but was negative for sleep apnea CPAP - N/a  Blood Thinner Instructions: N/A Aspirin Instructions: N/A  ERAS Protcol - No PRE-SURGERY Ensure or G2- N/A  COVID TEST-  Scheduled today after PAT appointment. Patient verbalized understanding of self-quarantine and appointment time and place.  Anesthesia review: YES, abnormal EKG, heart history  Patient denies shortness of breath, fever, cough and chest pain at PAT appointment  All instructions explained to the patient, with a verbal understanding of the material. Patient agrees to go over the instructions while at home for a better understanding. Patient also instructed to self quarantine after being tested for COVID-19. The opportunity to ask questions was provided.

## 2019-02-07 NOTE — Progress Notes (Signed)
Patient presented to PAT appointment. VS taken, unable to get O2 from pulse oximetry due to Raynaud's syndrome. Patient given heating packs to warm fingers, still unsuccessful. No c/o SOB, difficulty breathing, or chest pain. Patient does not wear oxygen at home. PA-c made aware.

## 2019-02-07 NOTE — Anesthesia Preprocedure Evaluation (Addendum)
Anesthesia Evaluation  Patient identified by MRN, date of birth, ID band Patient awake    Reviewed: Allergy & Precautions, H&P , NPO status , Patient's Chart, lab work & pertinent test results  Airway Mallampati: II  TM Distance: >3 FB Neck ROM: Full    Dental no notable dental hx. (+) Edentulous Upper, Partial Lower, Dental Advisory Given   Pulmonary asthma , Current Smoker and Patient abstained from smoking.,    Pulmonary exam normal breath sounds clear to auscultation       Cardiovascular Exercise Tolerance: Good hypertension, + CAD and +CHF   Rhythm:Regular Rate:Normal     Neuro/Psych Anxiety negative neurological ROS     GI/Hepatic negative GI ROS, Neg liver ROS,   Endo/Other  Hypothyroidism   Renal/GU negative Renal ROS  negative genitourinary   Musculoskeletal   Abdominal   Peds  Hematology  (+) Blood dyscrasia, anemia ,   Anesthesia Other Findings   Reproductive/Obstetrics negative OB ROS                           Anesthesia Physical Anesthesia Plan  ASA: III  Anesthesia Plan: General   Post-op Pain Management:    Induction: Intravenous  PONV Risk Score and Plan: 2 and Propofol infusion, Midazolam, Ondansetron and Dexamethasone  Airway Management Planned: Oral ETT  Additional Equipment: Arterial line  Intra-op Plan:   Post-operative Plan: Extubation in OR  Informed Consent: I have reviewed the patients History and Physical, chart, labs and discussed the procedure including the risks, benefits and alternatives for the proposed anesthesia with the patient or authorized representative who has indicated his/her understanding and acceptance.     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 [facility] 06/30/17 showed mod to severe PAH. Started on sildenafil and opsumit. Last seen by Dr. Haroldine Laws 02/03/19, per OV note "We saw him last week for progressive volume overload. CXR with bilateral moderate pleural effusions. Hgb 7.8. We increased torsemide to 20 bid and had him come back today with echo. Feels much better. Has  Lost 4 pounds. Less SOB. Cough improved. Echo today with normal LV function but persistent large pericardial effusion. Thre is some RA compression but no overt tamponade. RV moderately HK. RVSP ~40. IVC 1.5cm." Pt was referred back to Dr. [name] for possible repeat window and R chest tube.  Follows with Dr. Joelyn Oms for CKD III, recently worsened creatinine due to use of diuretics for treatment of edema. Recent baseline creatinine appears to be ~2.20. Preop labs with creatinine 2.85. Per Dr. Leonarda Salon note, part of the impetus to proceed with surgery is to hopefully reduce need for diuretics in setting of worsened renal function.  TTE 02/03/19: 1. Left ventricular ejection fraction, by visual estimation, is 55 to 60%. The left ventricle has normal function. Normal left ventricular size. Left ventricular septal wall thickness was mildly increased. Mildly increased left ventricular posterior  wall thickness. There is mildly increased left ventricular hypertrophy.  2. Elevated left ventricular end-diastolic pressure.  3. Left ventricular diastolic Doppler parameters are consistent with pseudonormalization pattern of LV diastolic filling.  4. Global right ventricle mild-moderately reduced.The right ventricular size is normal. No increase in right ventricular wall thickness.  5. Left atrial size was moderately dilated.  6.  Right atrial size was normal.  7. Large pericardial effusion.  8. The pericardial effusion is circumferential.  9. There is inversion of the right ventricular wall. 10. Large  circumferential pericarcial effusion measuring up to 2.71 cm. Comapred with the echo 05/2018, pericardial effusion is unchanged. 11. The mitral valve is normal in structure. Trace mitral valve regurgitation. No evidence of mitral stenosis. 12. The tricuspid valve is normal in structure. Tricuspid valve regurgitation is mild. 13. The aortic valve is tricuspid Aortic valve regurgitation was not visualized by color flow Doppler. Structurally normal aortic valve, with no evidence of sclerosis or stenosis. 14. The pulmonic valve was normal in structure. Pulmonic valve regurgitation is not visualized by color flow Doppler. 15. Moderately elevated pulmonary artery systolic pressure. 16. The inferior vena cava is normal in size with <50% respiratory variability, suggesting right atrial pressure of 8 mmHg.      )       Anesthesia Quick Evaluation

## 2019-02-08 LAB — NOVEL CORONAVIRUS, NAA (HOSP ORDER, SEND-OUT TO REF LAB; TAT 18-24 HRS): SARS-CoV-2, NAA: NOT DETECTED

## 2019-02-10 ENCOUNTER — Inpatient Hospital Stay (HOSPITAL_COMMUNITY): Payer: 59 | Admitting: Physician Assistant

## 2019-02-10 ENCOUNTER — Inpatient Hospital Stay (HOSPITAL_COMMUNITY): Payer: 59

## 2019-02-10 ENCOUNTER — Other Ambulatory Visit: Payer: Self-pay

## 2019-02-10 ENCOUNTER — Inpatient Hospital Stay (HOSPITAL_COMMUNITY)
Admission: RE | Admit: 2019-02-10 | Discharge: 2019-02-14 | DRG: 270 | Disposition: A | Discharge status: Home / Self Care | Payer: 59 | Attending: Thoracic Surgery (Cardiothoracic Vascular Surgery) | Admitting: Thoracic Surgery (Cardiothoracic Vascular Surgery)

## 2019-02-10 ENCOUNTER — Inpatient Hospital Stay (HOSPITAL_COMMUNITY): Payer: 59 | Admitting: Anesthesiology

## 2019-02-10 ENCOUNTER — Encounter (HOSPITAL_COMMUNITY)
Admission: RE | Disposition: A | Discharge status: Home / Self Care | Payer: Self-pay | Source: Home / Self Care | Attending: Thoracic Surgery (Cardiothoracic Vascular Surgery)

## 2019-02-10 ENCOUNTER — Encounter (HOSPITAL_COMMUNITY): Payer: Self-pay

## 2019-02-10 DIAGNOSIS — I2721 Secondary pulmonary arterial hypertension: Secondary | ICD-10-CM | POA: Diagnosis not present

## 2019-02-10 DIAGNOSIS — Z841 Family history of disorders of kidney and ureter: Secondary | ICD-10-CM

## 2019-02-10 DIAGNOSIS — J90 Pleural effusion, not elsewhere classified: Secondary | ICD-10-CM | POA: Diagnosis not present

## 2019-02-10 DIAGNOSIS — E039 Hypothyroidism, unspecified: Secondary | ICD-10-CM | POA: Diagnosis present

## 2019-02-10 DIAGNOSIS — I130 Hypertensive heart and chronic kidney disease with heart failure and stage 1 through stage 4 chronic kidney disease, or unspecified chronic kidney disease: Secondary | ICD-10-CM | POA: Diagnosis present

## 2019-02-10 DIAGNOSIS — I313 Pericardial effusion (noninflammatory): Principal | ICD-10-CM | POA: Diagnosis present

## 2019-02-10 DIAGNOSIS — Q2733 Arteriovenous malformation of digestive system vessel: Secondary | ICD-10-CM

## 2019-02-10 DIAGNOSIS — E869 Volume depletion, unspecified: Secondary | ICD-10-CM | POA: Diagnosis present

## 2019-02-10 DIAGNOSIS — D62 Acute posthemorrhagic anemia: Secondary | ICD-10-CM | POA: Diagnosis not present

## 2019-02-10 DIAGNOSIS — M349 Systemic sclerosis, unspecified: Secondary | ICD-10-CM | POA: Diagnosis present

## 2019-02-10 DIAGNOSIS — Z681 Body mass index (BMI) 19 or less, adult: Secondary | ICD-10-CM

## 2019-02-10 DIAGNOSIS — Z4682 Encounter for fitting and adjustment of non-vascular catheter: Secondary | ICD-10-CM

## 2019-02-10 DIAGNOSIS — I2729 Other secondary pulmonary hypertension: Secondary | ICD-10-CM | POA: Diagnosis present

## 2019-02-10 DIAGNOSIS — Z09 Encounter for follow-up examination after completed treatment for conditions other than malignant neoplasm: Secondary | ICD-10-CM

## 2019-02-10 DIAGNOSIS — E43 Unspecified severe protein-calorie malnutrition: Secondary | ICD-10-CM | POA: Diagnosis present

## 2019-02-10 DIAGNOSIS — Z9889 Other specified postprocedural states: Secondary | ICD-10-CM

## 2019-02-10 DIAGNOSIS — I2510 Atherosclerotic heart disease of native coronary artery without angina pectoris: Secondary | ICD-10-CM | POA: Diagnosis present

## 2019-02-10 DIAGNOSIS — I2781 Cor pulmonale (chronic): Secondary | ICD-10-CM | POA: Diagnosis present

## 2019-02-10 DIAGNOSIS — Z86010 Personal history of colonic polyps: Secondary | ICD-10-CM

## 2019-02-10 DIAGNOSIS — N179 Acute kidney failure, unspecified: Secondary | ICD-10-CM | POA: Diagnosis not present

## 2019-02-10 DIAGNOSIS — Z8719 Personal history of other diseases of the digestive system: Secondary | ICD-10-CM

## 2019-02-10 DIAGNOSIS — K317 Polyp of stomach and duodenum: Secondary | ICD-10-CM | POA: Diagnosis present

## 2019-02-10 DIAGNOSIS — N183 Chronic kidney disease, stage 3 unspecified: Secondary | ICD-10-CM | POA: Diagnosis present

## 2019-02-10 DIAGNOSIS — I071 Rheumatic tricuspid insufficiency: Secondary | ICD-10-CM | POA: Diagnosis present

## 2019-02-10 DIAGNOSIS — E785 Hyperlipidemia, unspecified: Secondary | ICD-10-CM | POA: Diagnosis present

## 2019-02-10 DIAGNOSIS — Z20828 Contact with and (suspected) exposure to other viral communicable diseases: Secondary | ICD-10-CM | POA: Diagnosis present

## 2019-02-10 DIAGNOSIS — F17210 Nicotine dependence, cigarettes, uncomplicated: Secondary | ICD-10-CM | POA: Diagnosis present

## 2019-02-10 DIAGNOSIS — I252 Old myocardial infarction: Secondary | ICD-10-CM

## 2019-02-10 DIAGNOSIS — T502X5A Adverse effect of carbonic-anhydrase inhibitors, benzothiadiazides and other diuretics, initial encounter: Secondary | ICD-10-CM | POA: Diagnosis not present

## 2019-02-10 DIAGNOSIS — Z7951 Long term (current) use of inhaled steroids: Secondary | ICD-10-CM

## 2019-02-10 DIAGNOSIS — Z955 Presence of coronary angioplasty implant and graft: Secondary | ICD-10-CM

## 2019-02-10 DIAGNOSIS — Z79899 Other long term (current) drug therapy: Secondary | ICD-10-CM

## 2019-02-10 DIAGNOSIS — Z832 Family history of diseases of the blood and blood-forming organs and certain disorders involving the immune mechanism: Secondary | ICD-10-CM

## 2019-02-10 DIAGNOSIS — F419 Anxiety disorder, unspecified: Secondary | ICD-10-CM | POA: Diagnosis present

## 2019-02-10 DIAGNOSIS — I3139 Other pericardial effusion (noninflammatory): Secondary | ICD-10-CM

## 2019-02-10 DIAGNOSIS — I7300 Raynaud's syndrome without gangrene: Secondary | ICD-10-CM | POA: Diagnosis present

## 2019-02-10 DIAGNOSIS — M109 Gout, unspecified: Secondary | ICD-10-CM | POA: Diagnosis present

## 2019-02-10 DIAGNOSIS — I493 Ventricular premature depolarization: Secondary | ICD-10-CM | POA: Diagnosis not present

## 2019-02-10 DIAGNOSIS — Z79891 Long term (current) use of opiate analgesic: Secondary | ICD-10-CM

## 2019-02-10 HISTORY — PX: VIDEO ASSISTED THORACOSCOPY: SHX5073

## 2019-02-10 HISTORY — PX: PLEURAL EFFUSION DRAINAGE: SHX5099

## 2019-02-10 HISTORY — PX: PERICARDIAL WINDOW: SHX2213

## 2019-02-10 LAB — ECHO INTRAOPERATIVE TEE
Height: 68 in
Weight: 1760.01 oz

## 2019-02-10 LAB — BODY FLUID CELL COUNT WITH DIFFERENTIAL
Eos, Fluid: 0 %
Eos, Fluid: 0 %
Lymphs, Fluid: 14 %
Lymphs, Fluid: 41 %
Monocyte-Macrophage-Serous Fluid: 80 % (ref 50–90)
Monocyte-Macrophage-Serous Fluid: 20 % — ABNORMAL LOW (ref 50–90)
Neutrophil Count, Fluid: 6 % (ref 0–25)
Neutrophil Count, Fluid: 39 % — ABNORMAL HIGH (ref 0–25)
Total Nucleated Cell Count, Fluid: 7 cu mm (ref 0–1000)
Total Nucleated Cell Count, Fluid: 16 cu mm (ref 0–1000)

## 2019-02-10 LAB — POCT I-STAT 7, (LYTES, BLD GAS, ICA,H+H)
Acid-base deficit: 5 mmol/L — ABNORMAL HIGH (ref 0.0–2.0)
Acid-base deficit: 5 mmol/L — ABNORMAL HIGH (ref 0.0–2.0)
Bicarbonate: 20 mmol/L (ref 20.0–28.0)
Bicarbonate: 20.3 mmol/L (ref 20.0–28.0)
Calcium, Ion: 1.11 mmol/L — ABNORMAL LOW (ref 1.15–1.40)
Calcium, Ion: 1.14 mmol/L — ABNORMAL LOW (ref 1.15–1.40)
HCT: 21 % — ABNORMAL LOW (ref 39.0–52.0)
HCT: 31 % — ABNORMAL LOW (ref 39.0–52.0)
Hemoglobin: 7.1 g/dL — ABNORMAL LOW (ref 13.0–17.0)
Hemoglobin: 10.5 g/dL — ABNORMAL LOW (ref 13.0–17.0)
O2 Saturation: 100 %
O2 Saturation: 95 %
Patient temperature: 35
Potassium: 4.3 mmol/L (ref 3.5–5.1)
Potassium: 4.7 mmol/L (ref 3.5–5.1)
Sodium: 140 mmol/L (ref 135–145)
Sodium: 140 mmol/L (ref 135–145)
TCO2: 21 mmol/L — ABNORMAL LOW (ref 22–32)
TCO2: 21 mmol/L — ABNORMAL LOW (ref 22–32)
pCO2 arterial: 32.7 mmHg (ref 32.0–48.0)
pCO2 arterial: 36.8 mmHg (ref 32.0–48.0)
pH, Arterial: 7.385 (ref 7.350–7.450)
pH, Arterial: 7.35 (ref 7.350–7.450)
pO2, Arterial: 284 mmHg — ABNORMAL HIGH (ref 83.0–108.0)
pO2, Arterial: 80 mmHg — ABNORMAL LOW (ref 83.0–108.0)

## 2019-02-10 LAB — PREPARE RBC (CROSSMATCH)

## 2019-02-10 IMAGING — DX DG CHEST 1V PORT
1 series · 1 of 1 positions shown · non-contrast
Comparison: Chest x-ray dated [DATE].

CLINICAL DATA: Status post pericardial window.

EXAM:
PORTABLE CHEST 1 VIEW

[chest ap]
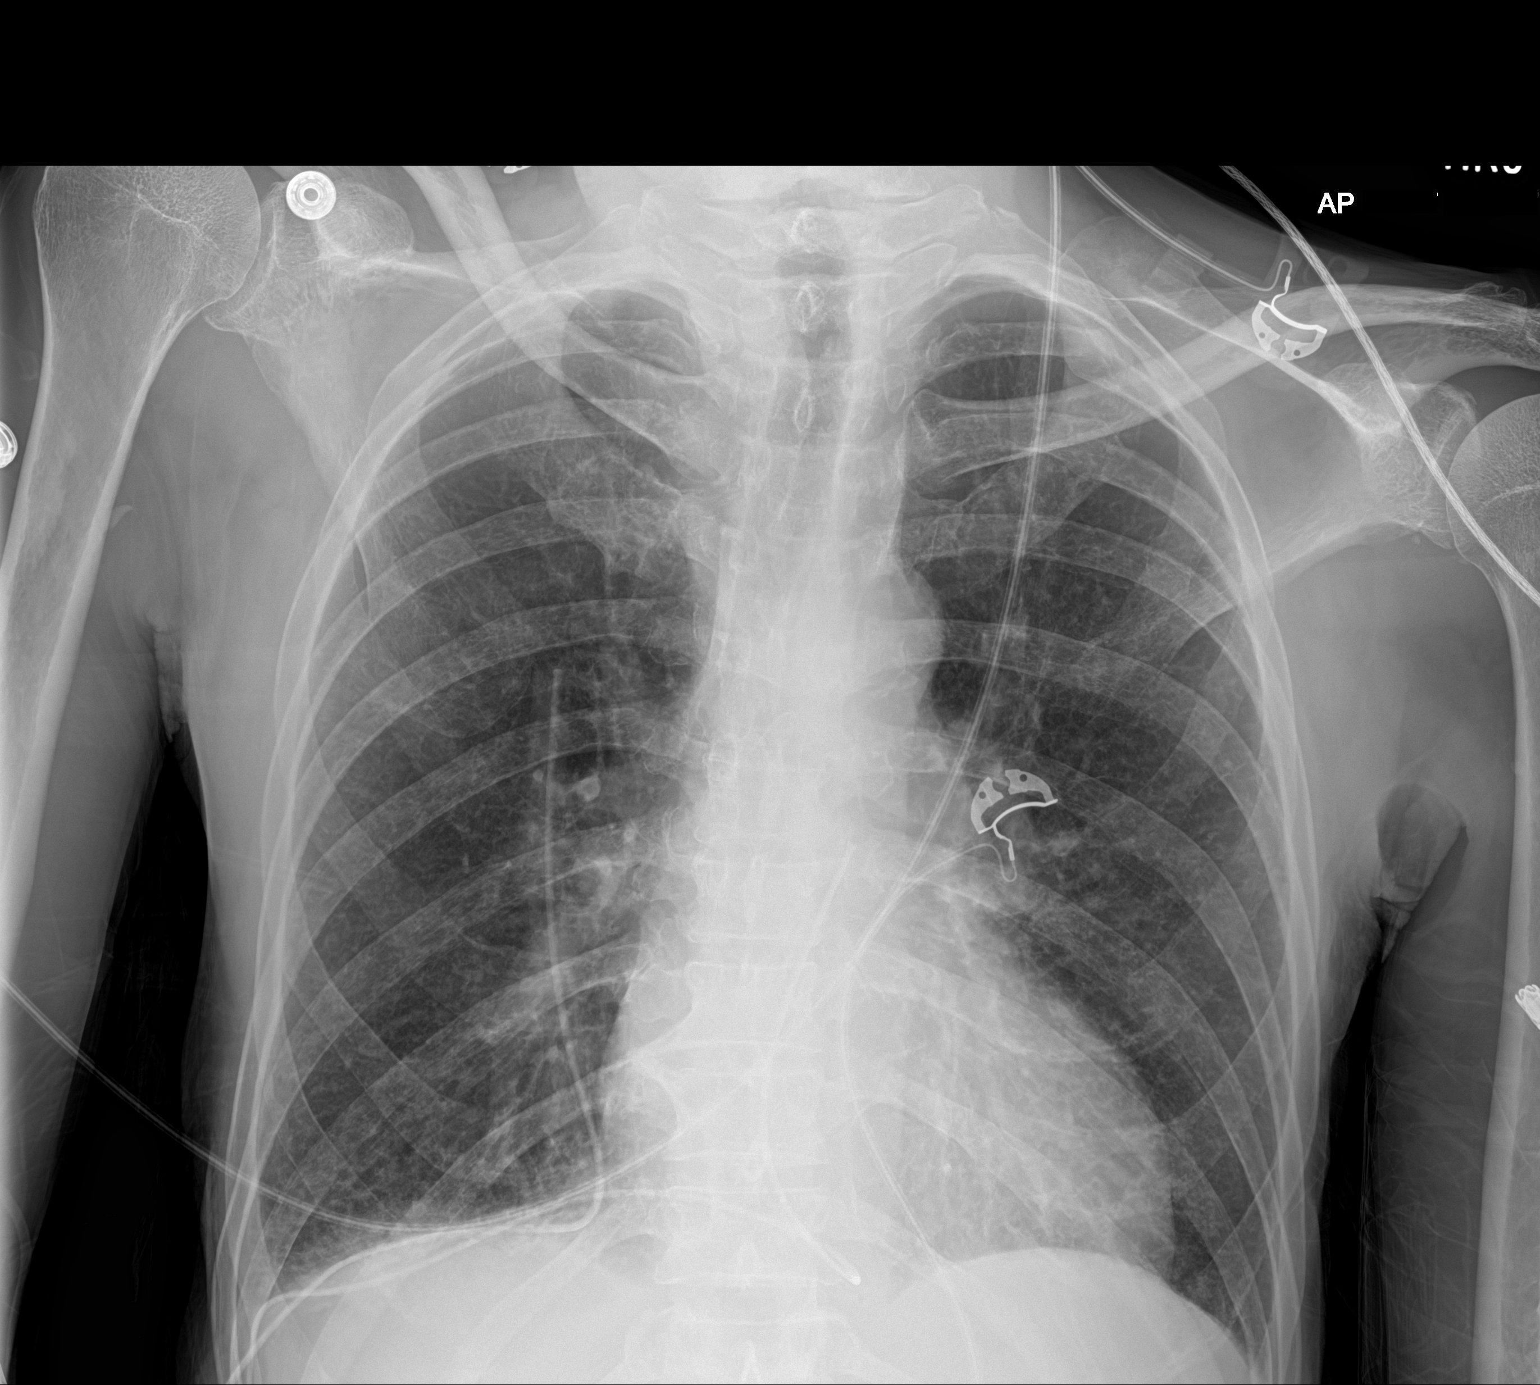

[1 of 1 positions shown; findings below may reference images not displayed]

FINDINGS: Normal heart size. Decreased size of the cardiopericardial
silhouette with new pericardial drainage catheter. New right-sided
chest tube. Unchanged trace right pleural effusion with adjacent
mild basilar atelectasis. No focal consolidation, pleural effusion,
or pneumothorax. No acute osseous abnormality.
IMPRESSION: 1. Decreased size of the cardiopericardial silhouette status post
pericardial drainage catheter placement.
2. New right-sided chest tube. No pneumothorax.
3. Unchanged trace right pleural effusion.

## 2019-02-10 SURGERY — VIDEO ASSISTED THORACOSCOPY
Anesthesia: General | Site: Chest | Laterality: Right

## 2019-02-10 MED ORDER — ACETAMINOPHEN 160 MG/5ML PO SOLN: 1000.0000 mg | Freq: Four times a day (QID) | ORAL | Status: DC

## 2019-02-10 MED ORDER — ONDANSETRON HCL 4 MG/2ML IJ SOLN: 4.0000 mg | Freq: Four times a day (QID) | INTRAMUSCULAR | Status: DC | PRN

## 2019-02-10 MED ORDER — IPRATROPIUM-ALBUTEROL 0.5-2.5 (3) MG/3ML IN SOLN: 3.0000 mL | Freq: Four times a day (QID) | RESPIRATORY_TRACT | Status: DC | PRN

## 2019-02-10 MED ORDER — PHENYLEPHRINE 40 MCG/ML (10ML) SYRINGE FOR IV PUSH (FOR BLOOD PRESSURE SUPPORT)
PREFILLED_SYRINGE | INTRAVENOUS | Status: DC | PRN
  Administered 2019-02-10 (×2): 80 ug via INTRAVENOUS
  Administered 2019-02-10: 200 ug via INTRAVENOUS
  Administered 2019-02-10: 40 ug via INTRAVENOUS

## 2019-02-10 MED ORDER — SODIUM CHLORIDE 0.9% FLUSH: 9.0000 mL | INTRAVENOUS | Status: DC | PRN

## 2019-02-10 MED ORDER — SODIUM CHLORIDE (PF) 0.9 % IJ SOLN
INTRAMUSCULAR | Status: DC | PRN
  Administered 2019-02-10: 50 mL via INTRAVENOUS

## 2019-02-10 MED ORDER — FENTANYL CITRATE (PF) 250 MCG/5ML IJ SOLN
INTRAMUSCULAR | Status: AC
  Filled 2019-02-10: qty 5

## 2019-02-10 MED ORDER — CEFAZOLIN SODIUM-DEXTROSE 2-4 GM/100ML-% IV SOLN
2.0000 g | INTRAVENOUS | Status: AC
  Administered 2019-02-10: 2 g via INTRAVENOUS
  Filled 2019-02-10: qty 100

## 2019-02-10 MED ORDER — ALBUMIN HUMAN 5 % IV SOLN
12.5000 g | Freq: Once | INTRAVENOUS | Status: AC
  Administered 2019-02-10: 12.5 g via INTRAVENOUS

## 2019-02-10 MED ORDER — LIDOCAINE 2% (20 MG/ML) 5 ML SYRINGE
INTRAMUSCULAR | Status: DC | PRN
  Administered 2019-02-10: 60 mg via INTRAVENOUS

## 2019-02-10 MED ORDER — ONDANSETRON HCL 4 MG/2ML IJ SOLN
INTRAMUSCULAR | Status: DC | PRN
  Administered 2019-02-10: 4 mg via INTRAVENOUS

## 2019-02-10 MED ORDER — ROCURONIUM BROMIDE 10 MG/ML (PF) SYRINGE
PREFILLED_SYRINGE | INTRAVENOUS | Status: DC | PRN
  Administered 2019-02-10: 50 mg via INTRAVENOUS

## 2019-02-10 MED ORDER — FLUTICASONE PROPIONATE 50 MCG/ACT NA SUSP
2.0000 | Freq: Every day | NASAL | Status: DC
  Administered 2019-02-11: 2 via NASAL
  Filled 2019-02-10: qty 16

## 2019-02-10 MED ORDER — IPRATROPIUM-ALBUTEROL 0.5-2.5 (3) MG/3ML IN SOLN
3.0000 mL | Freq: Four times a day (QID) | RESPIRATORY_TRACT | Status: DC
  Administered 2019-02-10: 3 mL via RESPIRATORY_TRACT
  Filled 2019-02-10: qty 3

## 2019-02-10 MED ORDER — BUPIVACAINE LIPOSOME 1.3 % IJ SUSP
INTRAMUSCULAR | Status: DC | PRN
  Administered 2019-02-10: 81 mL

## 2019-02-10 MED ORDER — SIMVASTATIN 20 MG PO TABS
40.0000 mg | ORAL_TABLET | Freq: Every day | ORAL | Status: DC
  Administered 2019-02-11 – 2019-02-14 (×4): 40 mg via ORAL
  Filled 2019-02-10 (×4): qty 2

## 2019-02-10 MED ORDER — SODIUM CHLORIDE 0.9 % IV SOLN
INTRAVENOUS | Status: DC
  Administered 2019-02-10 – 2019-02-13 (×2): via INTRAVENOUS

## 2019-02-10 MED ORDER — ALPRAZOLAM 0.5 MG PO TABS: 0.5000 mg | ORAL_TABLET | Freq: Every day | ORAL | Status: DC | PRN

## 2019-02-10 MED ORDER — PROPOFOL 10 MG/ML IV BOLUS
INTRAVENOUS | Status: DC | PRN
  Administered 2019-02-10: 110 mg via INTRAVENOUS

## 2019-02-10 MED ORDER — DEXAMETHASONE SODIUM PHOSPHATE 10 MG/ML IJ SOLN
INTRAMUSCULAR | Status: DC | PRN
  Administered 2019-02-10: 10 mg via INTRAVENOUS

## 2019-02-10 MED ORDER — OXYCODONE HCL 5 MG PO TABS: 5.0000 mg | ORAL_TABLET | ORAL | Status: DC | PRN

## 2019-02-10 MED ORDER — TRAMADOL HCL 50 MG PO TABS
50.0000 mg | ORAL_TABLET | Freq: Four times a day (QID) | ORAL | Status: DC | PRN
  Administered 2019-02-12 (×2): 50 mg via ORAL
  Filled 2019-02-10 (×2): qty 1

## 2019-02-10 MED ORDER — SUCCINYLCHOLINE CHLORIDE 20 MG/ML IJ SOLN
INTRAMUSCULAR | Status: DC | PRN
  Administered 2019-02-10: 80 mg via INTRAVENOUS

## 2019-02-10 MED ORDER — ALLOPURINOL 100 MG PO TABS
100.0000 mg | ORAL_TABLET | Freq: Every day | ORAL | Status: DC
  Administered 2019-02-11 – 2019-02-14 (×4): 100 mg via ORAL
  Filled 2019-02-10 (×4): qty 1

## 2019-02-10 MED ORDER — CEFAZOLIN SODIUM-DEXTROSE 1-4 GM/50ML-% IV SOLN
1.0000 g | Freq: Two times a day (BID) | INTRAVENOUS | Status: AC
  Administered 2019-02-10 – 2019-02-11 (×2): 1 g via INTRAVENOUS
  Filled 2019-02-10 (×2): qty 50

## 2019-02-10 MED ORDER — BUPIVACAINE HCL (PF) 0.5 % IJ SOLN
INTRAMUSCULAR | Status: AC
  Filled 2019-02-10: qty 30

## 2019-02-10 MED ORDER — FENTANYL CITRATE (PF) 100 MCG/2ML IJ SOLN
INTRAMUSCULAR | Status: DC | PRN
  Administered 2019-02-10: 50 ug via INTRAVENOUS
  Administered 2019-02-10: 100 ug via INTRAVENOUS

## 2019-02-10 MED ORDER — TORSEMIDE 20 MG PO TABS
20.0000 mg | ORAL_TABLET | Freq: Two times a day (BID) | ORAL | Status: DC
  Administered 2019-02-10: 20 mg via ORAL
  Filled 2019-02-10: qty 1

## 2019-02-10 MED ORDER — ALBUMIN HUMAN 5 % IV SOLN
INTRAVENOUS | Status: AC
  Administered 2019-02-10: 12.5 g
  Filled 2019-02-10: qty 250

## 2019-02-10 MED ORDER — SODIUM CHLORIDE 0.9 % IV SOLN
INTRAVENOUS | Status: DC | PRN
  Administered 2019-02-10: 07:00:00 via INTRAVENOUS

## 2019-02-10 MED ORDER — NALOXONE HCL 0.4 MG/ML IJ SOLN: 0.4000 mg | INTRAMUSCULAR | Status: DC | PRN

## 2019-02-10 MED ORDER — POTASSIUM CHLORIDE CRYS ER 20 MEQ PO TBCR
20.0000 meq | EXTENDED_RELEASE_TABLET | Freq: Two times a day (BID) | ORAL | Status: DC
  Administered 2019-02-11: 20 meq via ORAL
  Filled 2019-02-10: qty 1

## 2019-02-10 MED ORDER — SODIUM CHLORIDE 0.9 % IV SOLN: INTRAVENOUS | Status: DC | PRN

## 2019-02-10 MED ORDER — SILDENAFIL CITRATE 20 MG PO TABS
20.0000 mg | ORAL_TABLET | Freq: Three times a day (TID) | ORAL | Status: DC
  Administered 2019-02-10 – 2019-02-14 (×12): 20 mg via ORAL
  Filled 2019-02-10 (×12): qty 1

## 2019-02-10 MED ORDER — MIDAZOLAM HCL 5 MG/5ML IJ SOLN
INTRAMUSCULAR | Status: DC | PRN
  Administered 2019-02-10: .5 mg via INTRAVENOUS

## 2019-02-10 MED ORDER — BUPIVACAINE LIPOSOME 1.3 % IJ SUSP
20.0000 mL | INTRAMUSCULAR | Status: DC
  Filled 2019-02-10: qty 20

## 2019-02-10 MED ORDER — PROPOFOL 10 MG/ML IV BOLUS
INTRAVENOUS | Status: AC
  Filled 2019-02-10: qty 20

## 2019-02-10 MED ORDER — SODIUM CHLORIDE 0.9 % IV SOLN: 10.0000 mL/h | Freq: Once | INTRAVENOUS | Status: DC

## 2019-02-10 MED ORDER — MACITENTAN 10 MG PO TABS
10.0000 mg | ORAL_TABLET | Freq: Every day | ORAL | Status: DC
  Administered 2019-02-11 – 2019-02-14 (×4): 10 mg via ORAL
  Filled 2019-02-10 (×4): qty 1

## 2019-02-10 MED ORDER — 0.9 % SODIUM CHLORIDE (POUR BTL) OPTIME
TOPICAL | Status: DC | PRN
  Administered 2019-02-10: 1000 mL

## 2019-02-10 MED ORDER — SENNOSIDES-DOCUSATE SODIUM 8.6-50 MG PO TABS
1.0000 | ORAL_TABLET | Freq: Every day | ORAL | Status: DC
  Administered 2019-02-11 – 2019-02-12 (×2): 1 via ORAL
  Filled 2019-02-10 (×3): qty 1

## 2019-02-10 MED ORDER — PHENYLEPHRINE HCL (PRESSORS) 10 MG/ML IV SOLN
INTRAVENOUS | Status: DC | PRN
  Administered 2019-02-10 (×2): 80 ug via INTRAVENOUS

## 2019-02-10 MED ORDER — DIPHENHYDRAMINE HCL 12.5 MG/5ML PO ELIX
12.5000 mg | ORAL_SOLUTION | Freq: Four times a day (QID) | ORAL | Status: DC | PRN
  Filled 2019-02-10: qty 5

## 2019-02-10 MED ORDER — HYDROMORPHONE 1 MG/ML IV SOLN
INTRAVENOUS | Status: AC
  Filled 2019-02-10: qty 30

## 2019-02-10 MED ORDER — BISACODYL 5 MG PO TBEC
10.0000 mg | DELAYED_RELEASE_TABLET | Freq: Every day | ORAL | Status: DC
  Administered 2019-02-11 – 2019-02-12 (×2): 10 mg via ORAL
  Filled 2019-02-10 (×4): qty 2

## 2019-02-10 MED ORDER — NICOTINE 14 MG/24HR TD PT24
14.0000 mg | MEDICATED_PATCH | Freq: Every day | TRANSDERMAL | Status: DC
  Administered 2019-02-10 – 2019-02-14 (×5): 14 mg via TRANSDERMAL
  Filled 2019-02-10 (×5): qty 1

## 2019-02-10 MED ORDER — SUGAMMADEX SODIUM 200 MG/2ML IV SOLN
INTRAVENOUS | Status: DC | PRN
  Administered 2019-02-10: 200 mg via INTRAVENOUS

## 2019-02-10 MED ORDER — SODIUM CHLORIDE 0.9 % IV SOLN
INTRAVENOUS | Status: DC | PRN
  Administered 2019-02-10: 30 ug/min via INTRAVENOUS

## 2019-02-10 MED ORDER — ACETAMINOPHEN 500 MG PO TABS
1000.0000 mg | ORAL_TABLET | Freq: Four times a day (QID) | ORAL | Status: DC
  Administered 2019-02-10 – 2019-02-14 (×15): 1000 mg via ORAL
  Filled 2019-02-10 (×16): qty 2

## 2019-02-10 MED ORDER — DIPHENHYDRAMINE HCL 50 MG/ML IJ SOLN: 12.5000 mg | Freq: Four times a day (QID) | INTRAMUSCULAR | Status: DC | PRN

## 2019-02-10 MED ORDER — HYDROMORPHONE HCL 1 MG/ML IJ SOLN: 0.2500 mg | INTRAMUSCULAR | Status: DC | PRN

## 2019-02-10 MED ORDER — HYDROMORPHONE 1 MG/ML IV SOLN
INTRAVENOUS | Status: DC
  Administered 2019-02-10: 30 mg via INTRAVENOUS
  Administered 2019-02-11: 2 mg via INTRAVENOUS
  Administered 2019-02-11: 0 mg via INTRAVENOUS
  Administered 2019-02-11: 1 mg via INTRAVENOUS
  Administered 2019-02-11: 0.2 mg via INTRAVENOUS
  Administered 2019-02-11: 2 mg via INTRAVENOUS
  Administered 2019-02-12: 0 mg via INTRAVENOUS
  Administered 2019-02-12: 0.2 mg via INTRAVENOUS
  Administered 2019-02-12: 0.8 mg via INTRAVENOUS
  Administered 2019-02-12: 1 mg via INTRAVENOUS
  Administered 2019-02-12 – 2019-02-13 (×4): 0.2 mg via INTRAVENOUS
  Administered 2019-02-13: 0.4 mg via INTRAVENOUS

## 2019-02-10 MED ORDER — ACETAMINOPHEN 500 MG PO TABS
1000.0000 mg | ORAL_TABLET | Freq: Once | ORAL | Status: AC
  Administered 2019-02-10: 1000 mg via ORAL
  Filled 2019-02-10: qty 2

## 2019-02-10 MED ORDER — INFLUENZA VAC SPLIT QUAD 0.5 ML IM SUSY: 0.5000 mL | PREFILLED_SYRINGE | INTRAMUSCULAR | Status: DC

## 2019-02-10 MED ORDER — MIDAZOLAM HCL 2 MG/2ML IJ SOLN
INTRAMUSCULAR | Status: AC
  Filled 2019-02-10: qty 2

## 2019-02-10 SURGICAL SUPPLY — 104 items
ADH SKN CLS APL DERMABOND .7 (GAUZE/BANDAGES/DRESSINGS) ×2
APL SKNCLS STERI-STRIP NONHPOA (GAUZE/BANDAGES/DRESSINGS) ×2
APL SWBSTK 6 STRL LF DISP (MISCELLANEOUS)
APPLICATOR COTTON TIP 6 STRL (MISCELLANEOUS) IMPLANT
APPLICATOR COTTON TIP 6IN STRL (MISCELLANEOUS)
APPLIER CLIP ROT 10 11.4 M/L (STAPLE)
APR CLP MED LRG 11.4X10 (STAPLE)
BAG SPEC RTRVL LRG 6X4 10 (ENDOMECHANICALS)
BENZOIN TINCTURE PRP APPL 2/3 (GAUZE/BANDAGES/DRESSINGS) ×4 IMPLANT
BLADE CLIPPER SURG (BLADE) ×4 IMPLANT
CANISTER SUCT 3000ML PPV (MISCELLANEOUS) ×4 IMPLANT
CATH THORACIC 28FR (CATHETERS) ×4 IMPLANT
CATH THORACIC 28FR RT ANG (CATHETERS) IMPLANT
CATH THORACIC 36FR (CATHETERS) ×2 IMPLANT
CATH THORACIC 36FR RT ANG (CATHETERS) IMPLANT
CLIP APPLIE ROT 10 11.4 M/L (STAPLE) IMPLANT
CLIP VESOCCLUDE MED 6/CT (CLIP) IMPLANT
CLOSURE WOUND 1/2 X4 (GAUZE/BANDAGES/DRESSINGS) ×1
CONN ST 1/4X3/8  BEN (MISCELLANEOUS) ×4
CONN ST 1/4X3/8 BEN (MISCELLANEOUS) IMPLANT
CONN Y 3/8X3/8X3/8  BEN (MISCELLANEOUS) ×2
CONN Y 3/8X3/8X3/8 BEN (MISCELLANEOUS) ×2 IMPLANT
CONT SPEC 4OZ CLIKSEAL STRL BL (MISCELLANEOUS) ×10 IMPLANT
COVER SURGICAL LIGHT HANDLE (MISCELLANEOUS) ×4 IMPLANT
COVER WAND RF STERILE (DRAPES) ×4 IMPLANT
DEFOGGER ANTIFOG KIT (MISCELLANEOUS) ×2 IMPLANT
DERMABOND ADVANCED (GAUZE/BANDAGES/DRESSINGS) ×2
DERMABOND ADVANCED .7 DNX12 (GAUZE/BANDAGES/DRESSINGS) IMPLANT
DRAIN CHANNEL 28F RND 3/8 FF (WOUND CARE) IMPLANT
DRAIN CHANNEL 32F RND 10.7 FF (WOUND CARE) IMPLANT
DRAPE LAPAROSCOPIC ABDOMINAL (DRAPES) ×4 IMPLANT
DRAPE SLUSH/WARMER DISC (DRAPES) ×4 IMPLANT
ELECT BLADE 6.5 EXT (BLADE) ×2 IMPLANT
ELECT REM PT RETURN 9FT ADLT (ELECTROSURGICAL) ×4
ELECTRODE REM PT RTRN 9FT ADLT (ELECTROSURGICAL) ×2 IMPLANT
GAUZE 4X4 16PLY RFD (DISPOSABLE) ×4 IMPLANT
GAUZE SPONGE 4X4 12PLY STRL (GAUZE/BANDAGES/DRESSINGS) ×8 IMPLANT
GAUZE SPONGE 4X4 12PLY STRL LF (GAUZE/BANDAGES/DRESSINGS) ×2 IMPLANT
GLOVE BIOGEL PI IND STRL 7.5 (GLOVE) IMPLANT
GLOVE BIOGEL PI INDICATOR 7.5 (GLOVE) ×2
GLOVE SURG SIGNA 7.5 PF LTX (GLOVE) ×4 IMPLANT
GLOVE TRIUMPH SURG SIZE 7.5 (KITS) ×8 IMPLANT
GOWN STRL REUS W/ TWL LRG LVL3 (GOWN DISPOSABLE) ×4 IMPLANT
GOWN STRL REUS W/ TWL XL LVL3 (GOWN DISPOSABLE) ×2 IMPLANT
GOWN STRL REUS W/TWL LRG LVL3 (GOWN DISPOSABLE) ×8
GOWN STRL REUS W/TWL XL LVL3 (GOWN DISPOSABLE) ×4
HEMOSTAT SURGICEL 2X14 (HEMOSTASIS) IMPLANT
IV CATH 22GX1 FEP (IV SOLUTION) IMPLANT
KIT BASIN OR (CUSTOM PROCEDURE TRAY) ×4 IMPLANT
KIT SUCTION CATH 14FR (SUCTIONS) ×4 IMPLANT
KIT TURNOVER KIT B (KITS) ×4 IMPLANT
NDL SPNL 18GX3.5 QUINCKE PK (NEEDLE) ×2 IMPLANT
NEEDLE SPNL 18GX3.5 QUINCKE PK (NEEDLE) ×4 IMPLANT
NS IRRIG 1000ML POUR BTL (IV SOLUTION) ×8 IMPLANT
PACK CHEST (CUSTOM PROCEDURE TRAY) ×4 IMPLANT
PAD ARMBOARD 7.5X6 YLW CONV (MISCELLANEOUS) ×8 IMPLANT
PAD ELECT DEFIB RADIOL ZOLL (MISCELLANEOUS) ×4 IMPLANT
POUCH ENDO CATCH II 15MM (MISCELLANEOUS) IMPLANT
POUCH SPECIMEN RETRIEVAL 10MM (ENDOMECHANICALS) IMPLANT
SEALANT PROGEL (MISCELLANEOUS) IMPLANT
SEALANT SURG COSEAL 4ML (VASCULAR PRODUCTS) IMPLANT
SEALANT SURG COSEAL 8ML (VASCULAR PRODUCTS) IMPLANT
SOL ANTI FOG 6CC (MISCELLANEOUS) ×2 IMPLANT
SOLUTION ANTI FOG 6CC (MISCELLANEOUS) ×2
SPECIMEN JAR MEDIUM (MISCELLANEOUS) ×4 IMPLANT
SPONGE INTESTINAL PEANUT (DISPOSABLE) IMPLANT
SPONGE TONSIL TAPE 1 RFD (DISPOSABLE) ×4 IMPLANT
STAPLER VISISTAT 35W (STAPLE) IMPLANT
STRIP CLOSURE SKIN 1/2X4 (GAUZE/BANDAGES/DRESSINGS) ×3 IMPLANT
SUT PROLENE 4 0 RB 1 (SUTURE)
SUT PROLENE 4-0 RB1 .5 CRCL 36 (SUTURE) IMPLANT
SUT SILK  1 MH (SUTURE) ×6
SUT SILK 1 MH (SUTURE) ×4 IMPLANT
SUT SILK 2 0 SH CR/8 (SUTURE) ×2 IMPLANT
SUT SILK 2 0SH CR/8 30 (SUTURE) IMPLANT
SUT SILK 3 0SH CR/8 30 (SUTURE) IMPLANT
SUT VIC AB 1 CTX 36 (SUTURE) ×4
SUT VIC AB 1 CTX36XBRD ANBCTR (SUTURE) ×2 IMPLANT
SUT VIC AB 2-0 CT1 27 (SUTURE) ×4
SUT VIC AB 2-0 CT1 TAPERPNT 27 (SUTURE) IMPLANT
SUT VIC AB 2-0 CTX 36 (SUTURE) ×4 IMPLANT
SUT VIC AB 2-0 UR6 27 (SUTURE) IMPLANT
SUT VIC AB 3-0 MH 27 (SUTURE) IMPLANT
SUT VIC AB 3-0 X1 27 (SUTURE) ×6 IMPLANT
SUT VICRYL 2 TP 1 (SUTURE) IMPLANT
SWAB COLLECTION DEVICE MRSA (MISCELLANEOUS) IMPLANT
SWAB CULTURE ESWAB REG 1ML (MISCELLANEOUS) IMPLANT
SYR 10ML LL (SYRINGE) ×2 IMPLANT
SYR 20ML LL LF (SYRINGE) IMPLANT
SYR 30ML SLIP (SYRINGE) IMPLANT
SYR CONTROL 10ML LL (SYRINGE) ×2 IMPLANT
SYSTEM SAHARA CHEST DRAIN ATS (WOUND CARE) ×4 IMPLANT
TAPE CLOTH 4X10 WHT NS (GAUZE/BANDAGES/DRESSINGS) ×4 IMPLANT
TAPE CLOTH SURG 4X10 WHT LF (GAUZE/BANDAGES/DRESSINGS) ×2 IMPLANT
TIP APPLICATOR SPRAY EXTEND 16 (VASCULAR PRODUCTS) IMPLANT
TOWEL GREEN STERILE (TOWEL DISPOSABLE) ×4 IMPLANT
TOWEL GREEN STERILE FF (TOWEL DISPOSABLE) ×4 IMPLANT
TRAP SPECIMEN MUCOUS 40CC (MISCELLANEOUS) ×4 IMPLANT
TRAY FOLEY MTR SLVR 14FR STAT (SET/KITS/TRAYS/PACK) ×4 IMPLANT
TRAY FOLEY MTR SLVR 16FR STAT (SET/KITS/TRAYS/PACK) ×4 IMPLANT
TROCAR XCEL BLADELESS 5X75MML (TROCAR) ×4 IMPLANT
TROCAR XCEL NON-BLD 5MMX100MML (ENDOMECHANICALS) ×2 IMPLANT
TUBING EXTENTION W/L.L. (IV SETS) ×2 IMPLANT
WATER STERILE IRR 1000ML POUR (IV SOLUTION) ×8 IMPLANT

## 2019-02-10 NOTE — Anesthesia Procedure Notes (Signed)
Procedure Name: Intubation Date/Time: 02/10/2019 9:39 AM Performed by: Neldon Newport, CRNA Pre-anesthesia Checklist: Patient identified, Emergency Drugs available, Suction available and Patient being monitored Patient Re-evaluated:Patient Re-evaluated prior to induction Oxygen Delivery Method: Circle System Utilized Preoxygenation: Pre-oxygenation with 100% oxygen Induction Type: IV induction Ventilation: Mask ventilation without difficulty Laryngoscope Size: Miller and 3 Grade View: Grade I Tube type: Oral Endobronchial tube: Double lumen EBT, Left and EBT position confirmed by fiberoptic bronchoscope and 39 Fr Number of attempts: 1 Airway Equipment and Method: Stylet and Oral airway Placement Confirmation: ETT inserted through vocal cords under direct vision,  positive ETCO2 and breath sounds checked- equal and bilateral Tube secured with: Tape Dental Injury: Teeth and Oropharynx as per pre-operative assessment

## 2019-02-10 NOTE — Anesthesia Postprocedure Evaluation (Signed)
Anesthesia Post Note  Patient: [name]  Procedure(s) Performed: VIDEO ASSISTED THORACOSCOPY (Right Chest) DRAINAGE OF PLEURAL EFFUSION (Right Chest) PERICARDIAL WINDOW (N/A Chest)     Patient location during evaluation: PACU Anesthesia Type: General Level of consciousness: awake and alert Pain management: pain level controlled Vital Signs Assessment: post-procedure vital signs reviewed and stable Respiratory status: spontaneous breathing, nonlabored ventilation and respiratory function stable Cardiovascular status: blood pressure returned to baseline and stable Postop Assessment: no apparent nausea or vomiting Anesthetic complications: no    Last Vitals:  Vitals:   02/10/19 1310 02/10/19 1315  BP: 101/64   Pulse:    Resp: 20 20  Temp:    SpO2:      Last Pain:  Vitals:   02/10/19 1300  TempSrc:   PainSc: 0-No pain                 Taheerah Guldin,W. EDMOND

## 2019-02-10 NOTE — Interval H&P Note (Signed)
History and Physical Interval Note: Creatinine elevated to 2.8 on preop labs. Will monitor while in hospital  02/10/2019 8:43 AM  [name]  has presented today for surgery, with the diagnosis of RIGHT PLEURAL EFFUSION PERICARDIAL EFFUSION.  The various methods of treatment have been discussed with the patient and family. After consideration of risks, benefits and other options for treatment, the patient has consented to  Procedure(s): VIDEO ASSISTED THORACOSCOPY (Right) DRAINAGE OF PLEURAL EFFUSION (Right) PERICARDIAL WINDOW (N/A) as a surgical intervention.  [name] history has been reviewed, patient examined, no change in status, stable for surgery.  I have reviewed the patient's chart and labs.  Questions were answered to the patient's satisfaction.     Melrose Nakayama

## 2019-02-10 NOTE — Plan of Care (Signed)

## 2019-02-10 NOTE — Anesthesia Procedure Notes (Signed)
Arterial Line Insertion Start/End10/19/2020 7:23 AM, 02/10/2019 7:35 AM Performed by: Sammie Bench, CRNA, CRNA  Patient location: Pre-op. Preanesthetic checklist: patient identified, IV checked, site marked, risks and benefits discussed, surgical consent, monitors and equipment checked, pre-op evaluation, timeout performed and anesthesia consent Lidocaine 1% used for infiltration Right, radial was placed Catheter size: 20 G Hand hygiene performed , maximum sterile barriers used  and Seldinger technique used Allen's test indicative of satisfactory collateral circulation Attempts: 1 Procedure performed without using ultrasound guided technique. Following insertion, dressing applied and Biopatch. Post procedure assessment: normal  Patient tolerated the procedure well with no immediate complications.

## 2019-02-10 NOTE — Transfer of Care (Signed)
Immediate Anesthesia Transfer of Care Note  Patient: [name]  Procedure(s) Performed: VIDEO ASSISTED THORACOSCOPY (Right Chest) DRAINAGE OF PLEURAL EFFUSION (Right Chest) PERICARDIAL WINDOW (N/A Chest)  Patient Location: PACU  Anesthesia Type:General  Level of Consciousness: awake, alert  and oriented  Airway & Oxygen Therapy: Patient Spontanous Breathing and Patient connected to face mask oxygen  Post-op Assessment: Report given to RN, Post -op Vital signs reviewed and stable and Patient moving all extremities X 4  Post vital signs: Reviewed and stable  Last Vitals:  Vitals Value Taken Time  BP    Temp    Pulse    Resp    SpO2      Last Pain:  Vitals:   02/10/19 0749  TempSrc: Oral  PainSc:          Complications: No apparent anesthesia complications

## 2019-02-10 NOTE — Progress Notes (Signed)
  Echocardiogram Echocardiogram Transesophageal has been performed.  Bobbye Charleston 02/10/2019, 9:56 AM

## 2019-02-10 NOTE — Progress Notes (Signed)
Several attempts made to obtain SPO2 using digit (adult and pediatric disposable probes), and temporal sites. Unable to obtain. Dr. Therisa Doyne notified. Patient resting comfortably, no acute distress noted.

## 2019-02-10 NOTE — Brief Op Note (Signed)
02/10/2019  11:00 AM  PATIENT:  [name]  58 y.o. male  PRE-OPERATIVE DIAGNOSIS:  RIGHT PLEURAL EFFUSION and RECURRENT PERICARDIAL EFFUSION  POST-OPERATIVE DIAGNOSIS:  RIGHT PLEURAL EFFUSION and RECURRENT PERICARDIAL EFFUSION  PROCEDURE:  Procedure(s): VIDEO ASSISTED THORACOSCOPY (Right) DRAINAGE OF PLEURAL EFFUSION (Right) PERICARDIAL WINDOW (N/A)  SURGEON:  Surgeon(s) and Role:    * Melrose Nakayama, MD - Primary  PHYSICIAN ASSISTANT: Roddenberry  ANESTHESIA:   general  EBL:  None   BLOOD ADMINISTERED:none  DRAINS: 19Fr Blake pericardial drain and 28Fr.Blake right pleural drain   LOCAL MEDICATIONS USED:  MARCAINE     SPECIMEN:  Right pleural fluid and pericardial fluid  DISPOSITION OF SPECIMEN:  Lab, pathology  COUNTS:  YES  DICTATION: .Dragon Dictation  PLAN OF CARE: Admit to inpatient   PATIENT DISPOSITION:  PACU - hemodynamically stable.   Delay start of Pharmacological VTE agent (>24hrs) due to surgical blood loss or risk of bleeding: no

## 2019-02-11 ENCOUNTER — Encounter (HOSPITAL_COMMUNITY): Payer: Self-pay | Admitting: Thoracic Surgery (Cardiothoracic Vascular Surgery)

## 2019-02-11 ENCOUNTER — Inpatient Hospital Stay (HOSPITAL_COMMUNITY): Payer: 59

## 2019-02-11 DIAGNOSIS — E43 Unspecified severe protein-calorie malnutrition: Secondary | ICD-10-CM | POA: Insufficient documentation

## 2019-02-11 DIAGNOSIS — Z9889 Other specified postprocedural states: Secondary | ICD-10-CM

## 2019-02-11 DIAGNOSIS — J90 Pleural effusion, not elsewhere classified: Secondary | ICD-10-CM

## 2019-02-11 DIAGNOSIS — I313 Pericardial effusion (noninflammatory): Principal | ICD-10-CM

## 2019-02-11 LAB — SURGICAL PATHOLOGY

## 2019-02-11 LAB — BASIC METABOLIC PANEL
Anion gap: 13 (ref 5–15)
BUN: 85 mg/dL — ABNORMAL HIGH (ref 6–20)
CO2: 19 mmol/L — ABNORMAL LOW (ref 22–32)
Calcium: 8.1 mg/dL — ABNORMAL LOW (ref 8.9–10.3)
Chloride: 102 mmol/L (ref 98–111)
Creatinine, Ser: 3.22 mg/dL — ABNORMAL HIGH (ref 0.61–1.24)
GFR calc Af Amer: 23 mL/min — ABNORMAL LOW (ref >60)
GFR calc non Af Amer: 20 mL/min — ABNORMAL LOW (ref >60)
Glucose, Bld: 192 mg/dL — ABNORMAL HIGH (ref 70–99)
Potassium: 4.9 mmol/L (ref 3.5–5.1)
Sodium: 134 mmol/L — ABNORMAL LOW (ref 135–145)

## 2019-02-11 LAB — BLOOD GAS, ARTERIAL
Acid-base deficit: 2.7 mmol/L — ABNORMAL HIGH (ref 0.0–2.0)
Bicarbonate: 21.1 mmol/L (ref 20.0–28.0)
Drawn by: 441351
FIO2: 21
O2 Saturation: 96.4 %
Patient temperature: 97
pCO2 arterial: 31.9 mmHg — ABNORMAL LOW (ref 32.0–48.0)
pH, Arterial: 7.431 (ref 7.350–7.450)
pO2, Arterial: 82.9 mmHg — ABNORMAL LOW (ref 83.0–108.0)

## 2019-02-11 LAB — CBC
HCT: 26.4 % — ABNORMAL LOW (ref 39.0–52.0)
Hemoglobin: 8.9 g/dL — ABNORMAL LOW (ref 13.0–17.0)
MCH: 29.4 pg (ref 26.0–34.0)
MCHC: 33.7 g/dL (ref 30.0–36.0)
MCV: 87.1 fL (ref 80.0–100.0)
Platelets: 248 10*3/uL (ref 150–400)
RBC: 3.03 MIL/uL — ABNORMAL LOW (ref 4.22–5.81)
RDW: 15.6 % — ABNORMAL HIGH (ref 11.5–15.5)
WBC: 14.5 10*3/uL — ABNORMAL HIGH (ref 4.0–10.5)
nRBC: 0 % (ref 0.0–0.2)

## 2019-02-11 LAB — GLUCOSE, BODY FLUID OTHER
Glucose, Body Fluid Other: 91 mg/dL
Glucose, Body Fluid Other: 94 mg/dL

## 2019-02-11 LAB — LD, BODY FLUID (OTHER)
LD, Body Fluid: 168 IU/L
LD, Body Fluid: 163 IU/L

## 2019-02-11 LAB — PROTEIN, BODY FLUID (OTHER)
Total Protein, Body Fluid Other: 5.2 g/dL
Total Protein, Body Fluid Other: 3.9 g/dL

## 2019-02-11 IMAGING — DX DG CHEST 1V PORT
1 series · 1 of 1 positions shown · non-contrast
Comparison: Chest x-ray [DATE].

CLINICAL DATA: Chest tube. Status post pericardial window creation.

EXAM:
PORTABLE CHEST 1 VIEW

[chest ap]
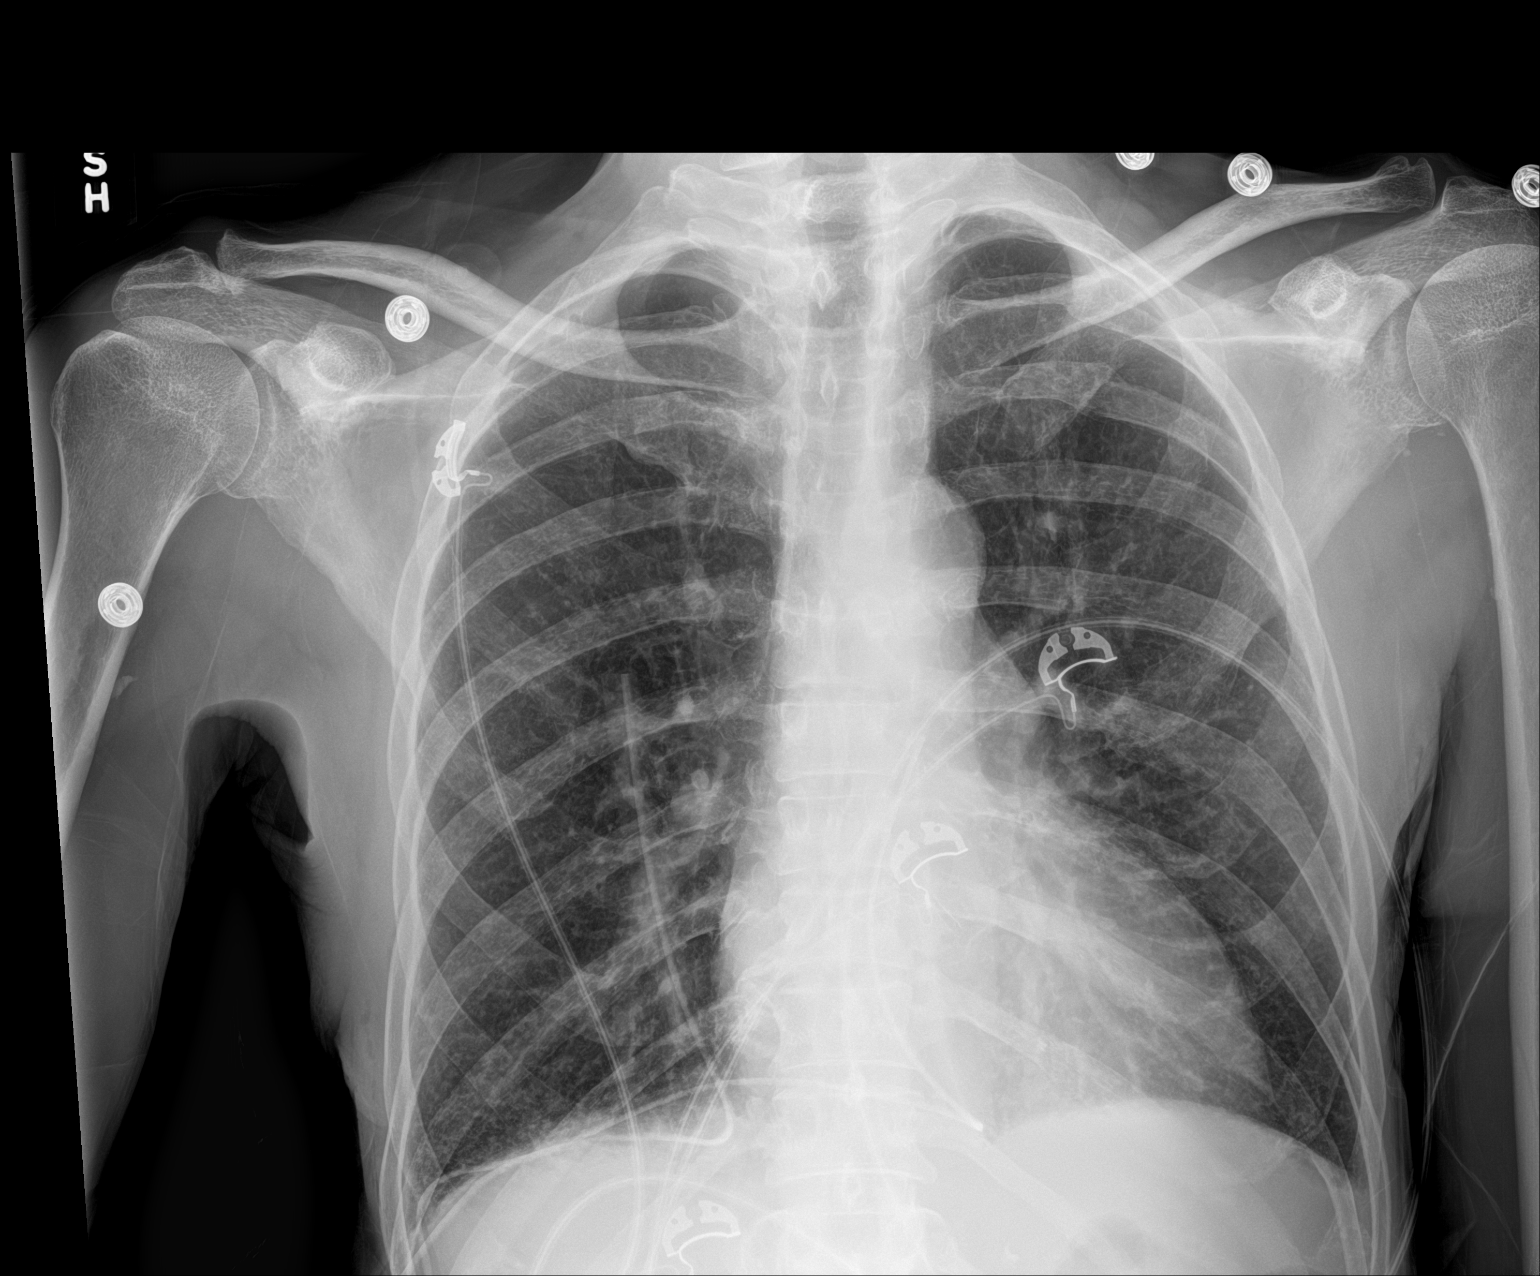

[1 of 1 positions shown; findings below may reference images not displayed]

FINDINGS: Pericardial drainage catheter and right chest tube in stable
position. Heart size stable. No pulmonary venous congestion. Mild
bibasilar subsegmental atelectasis. No pleural effusion or
pneumothorax.
IMPRESSION: 1. Pericardial drainage catheter and right chest tube in stable
position. No pneumothorax.

2.  Heart size stable.  No pulmonary venous congestion.

## 2019-02-11 MED ORDER — ENOXAPARIN SODIUM 30 MG/0.3ML ~~LOC~~ SOLN
30.0000 mg | SUBCUTANEOUS | Status: DC
  Administered 2019-02-11 – 2019-02-13 (×3): 30 mg via SUBCUTANEOUS
  Filled 2019-02-11 (×4): qty 0.3

## 2019-02-11 MED ORDER — SODIUM CHLORIDE 0.9 % IV BOLUS
500.0000 mL | Freq: Once | INTRAVENOUS | Status: AC
  Administered 2019-02-11: 500 mL via INTRAVENOUS

## 2019-02-11 MED ORDER — ENSURE ENLIVE PO LIQD
237.0000 mL | Freq: Three times a day (TID) | ORAL | Status: DC
  Administered 2019-02-11 – 2019-02-14 (×5): 237 mL via ORAL

## 2019-02-11 NOTE — Progress Notes (Addendum)
1 Day Post-Op Procedure(s) (LRB): VIDEO ASSISTED THORACOSCOPY (Right) DRAINAGE OF PLEURAL EFFUSION (Right) PERICARDIAL WINDOW (N/A) Subjective: Awake and alert. C/O soreness associated with the pleural tubes.  Currently on room air with no distress except for mild tachypnea.  CBC obtained immediately post-op in PACU revealed Hct 21% despite minimal operative blood loss. Two units PRBC's transfused.   Objective: Vital signs in last 24 hours: Temp:  [97.4 F (36.3 C)-98 F (36.7 C)] 97.4 F (36.3 C) (10/20 0509) Pulse Rate:  [69-81] 70 (10/19 1926) Cardiac Rhythm: Normal sinus rhythm (10/20 0700) Resp:  [16-29] 29 (10/20 0509) BP: (84-124)/(62-79) 124/79 (10/20 0509) SpO2:  [91 %-100 %] 95 % (10/20 0400) Arterial Line BP: (71-110)/(40-57) 91/48 (10/19 1300)    Intake/Output from previous day: 10/19 0701 - 10/20 0700 In: 2693 [I.V.:1913; Blood:630; IV Piggyback:150] Out: 1315 [Urine:975; Blood:20; Chest Tube:320] Intake/Output this shift: No intake/output data recorded.  General appearance: alert, cooperative and mild distress Neurologic: intact Heart: RRR with HR 60's.  Monitor shows SR. Lungs: clear to auscultation bilaterally. Pleural tubes drained 135m serous fluid past 12 hours. No air leak. CXR reviewed: Lungs well expanded, both tubes appear to be in the pleural space. Lung fields clear.  Extremities: No peripheral edema. Wound: The right chest incision is clean and dry, covered with Dermabond.   Lab Results: Recent Labs    02/10/19 1211 02/11/19 0233  WBC  --  14.5*  HGB 10.5* 8.9*  HCT 31.0* 26.4*  PLT  --  248   BMET:  Recent Labs    02/10/19 1211 02/11/19 0233  NA 140 134*  K 4.7 4.9  CL  --  102  CO2  --  19*  GLUCOSE  --  192*  BUN  --  85*  CREATININE  --  3.22*  CALCIUM  --  8.1*    PT/INR: No results for input(s): LABPROT, INR in the last 72 hours. ABG    Component Value Date/Time   PHART 7.431 02/11/2019 0341   HCO3 21.1 02/11/2019  0341   TCO2 21 (L) 02/10/2019 1211   ACIDBASEDEF 2.7 (H) 02/11/2019 0341   O2SAT 96.4 02/11/2019 0341   CBG (last 3)  No results for input(s): GLUCAP in the last 72 hours.  Assessment/Plan: S/P Procedure(s) (LRB): VIDEO ASSISTED THORACOSCOPY (Right) DRAINAGE OF PLEURAL EFFUSION (Right) PERICARDIAL WINDOW (N/A)  -POD-1 right VATS for drainage of pleural effusion and pericardial window for recurrent pericardial effusion. Minimal drainage, no air leak. Leave pleural tubes for now. D/C Foley catheter, mobilize. Continue PCA.  -History of pulmonary HTN- Chronic medications resumed. Avoid volume overload. Also to resume Demadex today.   -DVT PPX- Lovenox   -Anemia- present on admission. Hct stable after transfusion. Monitorl.  -Stage III CKD-K= and UO OK. Monitor closely. Hopefully renal function will improve with drainage of pericardial effusion.    LOS: 1 day    MAntony Odea [id number] [phone] 02/11/2019 Patient seen and examined, d/w Dr. BHaroldine Laws He is a dry with current dose of demadex. Will hold that today and give some additional fluid. Ambulate Enoxaparin + SCD + ambulation for DVT prophylaxis  SRemo LippsC. HRoxan Hockey MD [facility] ([phone]
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
## 2019-02-11 NOTE — Op Note (Signed)
NAME: [name], TREML MEDICAL RECORD HU:[id number] ACCOUNT [id number] DATE OF BIRTH:1960-11-26 FACILITY: [facility] LOCATION: [facility] PHYSICIAN:Terek Bee Chaya Jan, MD  OPERATIVE REPORT  DATE OF PROCEDURE:  02/10/2019  PREOPERATIVE DIAGNOSIS:  Right pleural effusion and pericardial effusion.  POSTOPERATIVE DIAGNOSIS:  Right pleural effusion and pericardial effusion.  PROCEDURE:   Right video-assisted thoracoscopy, Drainage of right pleural effusion, Pericardial window, Intercostal nerve blocks, levels 3-10  SURGEON:  Modesto Charon, MD  ASSISTANT:  Enid Cutter, PA  ANESTHESIA:  General.  FINDINGS:  Transesophageal echocardiography showed a large pericardial effusion. Near complete resolution after window. Small right pleural effusion.  Both effusions serous in nature.  CLINICAL NOTE:  The patient is a 58 year old gentleman with a complex medical history including tobacco abuse, pulmonary hypertension, hypertension, hyperlipidemia, hypothyroidism, coronary artery disease, MI, stage III chronic kidney disease,  scleroderma, Raynaud's syndrome and pericarditis.  He has had a history of a pericardial window approximately 10 years ago.  He recently has been having issues with an enlarging pericardial effusion along with a persistent right pleural effusion.  He is  now sent for drainage of his pericardial effusion.  Given the associated right pleural effusion, I recommend that we do a right VATS approach with drainage of both the pleural and pericardial effusions.  The indications, risks, benefits, and alternatives  were discussed in detail with the patient.  He understood and accepted the risks and agreed to proceed.  OPERATIVE NOTE:  The patient and was brought to the preoperative holding area on 02/10/2019.  Anesthesia service established central venous access and placed an arterial blood pressure monitor line.  He was taken to the operating room, anesthetized, and  intubated  with a double-lumen endotracheal tube.  Intravenous antibiotics were administered.  A Foley catheter was placed.  Sequential compression devices were placed on the calves for DVT prophylaxis.  He was placed in a left lateral decubitus position,  and the right chest was prepped and draped in the usual sterile fashion.  Single-lung ventilation of the left lung was initiated and was tolerated well throughout the procedure.  A timeout was performed.  A solution containing 20 mL of liposomal bupivacaine, 30 mL of 0.5% bupivacaine, and 50 mL of normal saline was prepared and was used for the local at the skin incisions as well as the intercostal nerve blocks.  An incision was  made in the 8th interspace in the midaxillary line.  A 5 mm port was inserted into the chest.  The thoracoscope was advanced into the chest.  There was a small right pleural effusion present.  The fluid appeared serous.  A 4 cm working incision was made  in the 6th interspace anterolaterally.  The pleural effusion was evacuated.  The fluid was sent for cell count with differential and chemistries.  Approximately 200 mL of fluid was evacuated.  The right middle lobe then was retracted posteriorly,  exposing the pericardium.  The pericardium was grasped and incised.  A 2 x 2 cm piece of the pericardium was excised using cautery.  This was sent for permanent pathology.  There was approximately 500 mL of fluid in the pericardial space.  This fluid  was serous in nature.  It was sent for cell count, differential, and chemistries as well as cytology.  There was no significant hemodynamic change with drainage of the pericardium.  A sucker was advanced into the pericardial space.  Transesophageal echocardiography  showed near complete resolution of the effusion, with possibly some small amount of fluid loculated  posteriorly behind the left atrium.  Intercostal nerve blocks were performed from the 3rd to the 10th interspaces.  10 mL of the  bupivacaine solution was injected at each level into a subpleural plane.  A 19-French Blake drain was then placed through a separate incision in the 8th interspace and placed into the pericardial space.  It was secured to skin with a 0 silk suture.  A 28-French chest tube was placed through the original port incision and  directed posteriorly.  It also was secured with a 0-silk suture.  Dual-lung ventilation was resumed.  The working incision was closed with a #1 Vicryl fascial suture.  The skin and subcutaneous tissue was closed in standard fashion.  Dermabond was  applied.  The chest tubes were placed to suction.  The patient was placed back in the supine position.  He was extubated in the operating room and taken to the postanesthetic care unit in good condition.  LN/NUANCE  D:02/10/2019 T:02/11/2019 JOB:[id number]

## 2019-02-11 NOTE — Progress Notes (Signed)
Initial Nutrition Assessment  DOCUMENTATION CODES:   Underweight, Severe malnutrition in context of chronic illness  INTERVENTION:   - Ensure Enlive po TID, each supplement provides 350 kcal and 20 grams of protein  - Magic cup TID with meals, each supplement provides 290 kcal and 9 grams of protein  - Encourage adequate PO intake  NUTRITION DIAGNOSIS:   Severe Malnutrition related to chronic illness (recurrent pleural effusion, CKD stage III) as evidenced by severe fat depletion, severe muscle depletion, percent weight loss (14% weight loss in less than 6 months).  GOAL:   Patient will meet greater than or equal to 90% of their needs  MONITOR:   PO intake, Supplement acceptance, Labs, Weight trends, Skin, I & O's  REASON FOR ASSESSMENT:   Other (underweight BMI)    ASSESSMENT:   58 year old male who presented on 10/19 for VATS, drainage of right pleural effusion, and pericardial window. PMH tobacco use, pulmonary HTN, HTN, HLD, hypothyroidism, CAD, MI, CKD stage III, scleroderma, gout, and pericarditis.   Spoke with pt at bedside. Pt reports that his appetite is improving. Pt states that at home, he has a pretty good appetite. Pt reports that he can't eat much at one time and doesn't eat 3 full meals daily. Pt states that he "nibbles" and eats 6 smaller meals daily. Pt reports he eats whatever his wife cooks or they go out to get something.  Pt reports that he used to drink Boost drinks but does not drink them anymore.  Pt states that he has been losing weight for about 2 weeks. Pt attributes weight loss to his "new medicine." Pt reports his UBW as 125-130 lbs and states that he has gotten down to 117 lbs.  Reviewed weight history in chart. Pt with an 8.1 kg weight loss since 08/30/18. This is a 14% weight loss in less than 6 months which is significant for timeframe.  Pt willing to drink Ensure Enlive and eat Magic Cups during admission. RD to order.  Meal Completion:  70% x 1 meal  Medications reviewed and include: Dulcolax, Senna  Labs reviewed: sodium 134, ionized calcium 1.14, hemoglobin 8.9  UOP: 975 ml x 24 hours CT: 320 ml x 24 hours I/O's: +1.4 L since admit  NUTRITION - FOCUSED PHYSICAL EXAM:    Most Recent Value  Orbital Region  Severe depletion  Upper Arm Region  Severe depletion  Thoracic and Lumbar Region  Severe depletion  Buccal Region  Severe depletion  Temple Region  Moderate depletion  Clavicle Bone Region  Severe depletion  Clavicle and Acromion Bone Region  Severe depletion  Scapular Bone Region  Severe depletion  Dorsal Hand  Moderate depletion  Patellar Region  Severe depletion  Anterior Thigh Region  Severe depletion  Posterior Calf Region  Severe depletion  Edema (RD Assessment)  None  Hair  Reviewed  Eyes  Reviewed  Mouth  Reviewed  Skin  Reviewed  Nails  Reviewed       Diet Order:   Diet Order            Diet Heart Room service appropriate? Yes; Fluid consistency: Thin  Diet effective now              EDUCATION NEEDS:   Education needs have been addressed  Skin:  Skin Assessment: Skin Integrity Issues: Skin Integrity Issues: Incisions: closed incision right abdomen  Last BM:  02/09/19  Height:   Ht Readings from Last 1 Encounters:  02/10/19 _0  (1.727  m)    Weight:   Wt Readings from Last 1 Encounters:  02/10/19 49.9 kg    Ideal Body Weight:  70 kg  BMI:  Body mass index is 16.73 kg/m.  Estimated Nutritional Needs:   Kcal:  2395-3202  Protein:  60-80 grams  Fluid:  1.5 L    Gaynell Face, MS, RD, LDN Inpatient Clinical Dietitian Pager: [phone] Weekend/After Hours: [phone]

## 2019-02-11 NOTE — TOC Initial Note (Signed)
Transition of Care [facility]) - Initial/Assessment Note    Patient Details  Name: [name] MRN: [id number] Date of Birth: 1961/01/20  [facility] CM/SW Contact:    Zenon Mayo, RN Phone Number: 02/11/2019, 4:54 PM  Clinical Narrative:                 Patient from home with wife, s/p VATs, chest tube to waterseal, has pca.  He has no issues with getting meds, uses no DME at home, pretty much indep.  Expected Discharge Plan: Home/Self Care Barriers to Discharge: No Barriers Identified   Patient Goals and CMS Choice Patient states their goals for this hospitalization and ongoing recovery are:: go home CMS Medicare.gov Compare Post Acute Care list provided to:: Patient Represenative (must comment)(wife) Choice offered to / list presented to : NA  Expected Discharge Plan and Services Expected Discharge Plan: Home/Self Care In-house Referral: NA Discharge Planning Services: CM Consult Post Acute Care Choice: NA Living arrangements for the past 2 months: Single Family Home                 DME Arranged: (NA)         HH Arranged: NA          Prior Living Arrangements/Services Living arrangements for the past 2 months: Single Family Home Lives with:: Spouse Patient language and need for interpreter reviewed:: Yes Do you feel safe going back to the place where you live?: Yes      Need for Family Participation in Patient Care: Yes (Comment) Care giver support system in place?: Yes (comment)   Criminal Activity/Legal Involvement Pertinent to Current Situation/Hospitalization: No - Comment as needed  Activities of Daily Living      Permission Sought/Granted                  Emotional Assessment       Orientation: : Oriented to Self, Oriented to Place, Oriented to  Time, Oriented to Situation Alcohol / Substance Use: Not Applicable Psych Involvement: No (comment)  Admission diagnosis:  RIGHT PLEURAL EFFUSION PERICARDIAL  EFFUSION Patient Active Problem List   Diagnosis Date Noted  . Protein-calorie malnutrition, severe 02/11/2019  . S/P pericardial surgery 02/10/2019  . GIB (gastrointestinal bleeding) 09/14/2018  . Arteriovenous malformation of gastrointestinal tract   . Gastritis and gastroduodenitis   . Adenomatous polyp of transverse colon   . Anemia 08/29/2018  . Symptomatic anemia 08/28/2018  . Acute renal failure superimposed on chronic kidney disease ([place]) 08/28/2018  . GI bleed 08/28/2018  . Chronic systolic (congestive) heart failure ([place])   . Melena   . Pulmonary hypertension ([place]) 06/18/2018  . Left ventricular dysfunction 06/18/2018  . Raynaud's syndrome without gangrene 05/24/2016  . Acute CHF (congestive heart failure) ([place]) 02/24/2016  . Essential hypertension 02/24/2016  . Pericardial effusion 05/01/2014  . Hyperlipidemia 10/18/2012  . CAD S/P percutaneous coronary angioplasty 04/26/2012  . Gout 04/26/2012  . Vitiligo   . Smoker 06/16/2011  . Chronic renal insufficiency, stage 3 (moderate) 06/16/2011  . 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 Scleroderma ([place]) 06/15/2011   PCP:  [name], MD Pharmacy:   [id number] [address], [place] [address]Phone: [phone]: [phone]    Social Determinants of Health (SDOH) Interventions    Readmission Risk Interventions Readmission Risk Prevention Plan 02/11/2019  Transportation Screening Complete  PCP or Specialist Appt within 3-5 Days Complete  [place]  [place]  Social Work Consult for [place] Planning/Counseling [place] Not Applicable  Medication Review Press photographer) Complete  Some recent data might be hidden

## 2019-02-11 NOTE — Discharge Summary (Signed)
Physician Discharge Summary  Patient ID: PINCHOS TOPEL MRN: [id number] DOB/AGE: 1960/08/11 58 y.o.  Admit date: 02/10/2019 Discharge date: 02/14/2019  Admission Diagnoses:  Recurrent pericardial effusion Right pleural effusion Scleroderma Stage 3 chronic kidney disease Coronary artery disease Pulmonary hypertension Anemia, chronic History of AV malformations History of colon polyps Right ventricular failure   Discharge Diagnoses:   Recurrent pericardial effusion Right pleural effusion Scleroderma Stage 3 chronic kidney disease Coronary artery disease Pulmonary hypertension Anemia, chronic History of AV malformations History of colon polyps Right ventricular failure Protein-calorie malnutrition, severe   Discharged Condition: good   History of Present Illness: [name] is a 58 year old man with a past history of tobacco abuse, pulmonary hypertension, hypertension, hyperlipidemia, hypothyroidism, coronary artery disease, MI, stage III chronic kidney disease, scleroderma, gout, Raynaud's, and pericarditis.  I did a subxiphoid pericardial window on him in 2010.  He has had a moderate pericardial effusion for several years.  Back in March he presented with worsening shortness of breath and swelling in his lower extremities.  An echocardiogram showed an ejection fraction of 45 to 50%.  He had a large pericardial effusion.  He also had elevated pulmonary artery pressures.  He was treated with Dr. Haroldine Laws and had improvement in his edema but had some worsening of his chronic kidney disease.  Recent echo showed a large pericardial effusion.  Of interest his PA pressures were estimated to be moderately elevated with an RV systolic of 41 mmHg (versus estimated 165mHg back in February).  LV systolic function was preserved, but there was moderate right ventricular dysfunction.  A chest x-ray showed a right pleural effusion.  His peripheral edema has improved with the  higher doses of diuretics.  He still gets short of breath with exertion.  It is unclear exactly what his exercise tolerance is. Dr. BHaroldine Lawsreferred him for consideration for pericardial window.  I discussed the proposed procedure of right VATS for drainage of his pleural effusion and pericardial window with Mr. SDolphand his family.  I informed them of the general nature of the procedure, the incisions to be used, the use of drains to postoperatively, the expected hospital stay, and the overall recovery.  They understand there is no guarantee that he could not have recurrent pleural or pericardial effusions.  I informed him of the indications, risks, benefits, and alternatives.  They understand the risks include, but not limited to death, MI, DVT, PE, bleeding, possible need for transfusion, infection, cardiac arrhythmias, respiratory or renal failure, as well as possibility of other and procedural complications.  He accepts the risks and wishes to proceed.  Hospital Course:   Mr. SMcgroartywas admitted for same-day surgery and taken to the OR where right VATS was performed for drainage of the pleural effusion and for pericardial window for recurrent pericardial effusion. He tolerated the procedure well, was extubated in the OR and recovered in the PACU. A CBC obtained in the PACU revealed a Hgb of 7 with Hct of 21%. He was transfused with 2 units PRBC's.  [facility] His pleual and pericardial tubes had minimal drainage overnight. Pain was managed with a PCA.  He was started back on his usual dosages of sildenifil and Opsumit.  Cardiology consult was requested for management of his chronic pulmonary hypertension and RV failure. We held diuretics for AKI. On 10/22 the chest tubes were removed. Follow-up CXR was stable. Will place patient back on torsemide and lasix per heart failure team. He  is ambulating with limited assistance, tolerating room air, his incisions are  healing well, and he is ready for discharge.   Consults: cardiology  Significant Diagnostic Studies:   ECHOCARDIOGRAM REPORT       Patient Name:   [name] Date of Exam: 02/03/2019 Medical Rec #:  [id number]         Height:       68.0 in Accession #:    [id number]        Weight:       117.2 lb Date of Birth:  05-07-1960         BSA:          1.63 m Patient Age:    60 years          BP:           138/66 mmHg Patient Gender: M                 HR:           92 bpm. Exam Location:  Outpatient  Procedure: 2D Echo  Indications:    Congestive Heart Failure 428.0 / I50.9   History:        Patient has prior history of Echocardiogram examinations, most                 recent 06/13/2018. CAD Risk Factors:Hypertension and                 Dyslipidemia.   Sonographer:    Talmage Coin Referring Phys: [id number] DANIEL R BENSIMHON  IMPRESSIONS    1. Left ventricular ejection fraction, by visual estimation, is 55 to 60%. The left ventricle has normal function. Normal left ventricular size. Left ventricular septal wall thickness was mildly increased. Mildly increased left ventricular posterior  wall thickness. There is mildly increased left ventricular hypertrophy.  2. Elevated left ventricular end-diastolic pressure.  3. Left ventricular diastolic Doppler parameters are consistent with pseudonormalization pattern of LV diastolic filling.  4. Global right ventricle mild-moderately reduced.The right ventricular size is normal. No increase in right ventricular wall thickness.  5. Left atrial size was moderately dilated.  6. Right atrial size was normal.  7. Large pericardial effusion.  8. The pericardial effusion is circumferential.  9. There is inversion of the right ventricular wall. 10. Large circumferential pericarcial effusion measuring up to 2.71 cm. Comapred with the echo 05/2018, pericardial effusion is unchanged. 11. The mitral valve is normal in structure. Trace mitral valve  regurgitation. No evidence of mitral stenosis. 12. The tricuspid valve is normal in structure. Tricuspid valve regurgitation is mild. 13. The aortic valve is tricuspid Aortic valve regurgitation was not visualized by color flow Doppler. Structurally normal aortic valve, with no evidence of sclerosis or stenosis. 14. The pulmonic valve was normal in structure. Pulmonic valve regurgitation is not visualized by color flow Doppler. 15. Moderately elevated pulmonary artery systolic pressure. 16. The inferior vena cava is normal in size with <50% respiratory variability, suggesting right atrial pressure of 8 mmHg.  FINDINGS  Left Ventricle: Left ventricular ejection fraction, by visual estimation, is 55 to 60%. The left ventricle has normal function. Left ventricular septal wall thickness was mildly increased. Mildly increased left ventricular posterior wall thickness.  There is mildly increased left ventricular hypertrophy. Normal left ventricular size. Spectral Doppler shows Left ventricular diastolic Doppler parameters are consistent with pseudonormalization pattern of LV diastolic filling. Elevated left ventricular  end-diastolic pressure.  Right Ventricle: The right ventricular size is  normal. No increase in right ventricular wall thickness. Global RV systolic function is mild-moderately reduced. The tricuspid regurgitant velocity is 2.90 m/s, and with an assumed right atrial pressure of 8  mmHg, the estimated right ventricular systolic pressure is moderately elevated at 41.6 mmHg.  Left Atrium: Left atrial size was moderately dilated.  Right Atrium: Right atrial size was normal in size  Pericardium: A large pericardial effusion is present. The pericardial effusion is circumferential. There is inversion of the right ventricular wall. Large circumferential pericarcial effusion measuring up to 2.71 cm. Comapred with the echo 05/2018,  pericardial effusion is unchanged.  Mitral Valve: The  mitral valve is normal in structure. No evidence of mitral valve stenosis by observation. Trace mitral valve regurgitation.  Tricuspid Valve: The tricuspid valve is normal in structure. Tricuspid valve regurgitation is mild by color flow Doppler.  Aortic Valve: The aortic valve is tricuspid. Aortic valve regurgitation was not visualized by color flow Doppler. The aortic valve is structurally normal, with no evidence of sclerosis or stenosis. Aortic valve mean gradient measures 7.0 mmHg. Aortic  valve peak gradient measures 11.6 mmHg. Aortic valve area, by VTI measures 2.03 cm.  Pulmonic Valve: The pulmonic valve was normal in structure. Pulmonic valve regurgitation is not visualized by color flow Doppler.  Aorta: The aortic root, ascending aorta and aortic arch are all structurally normal, with no evidence of dilitation or obstruction.  Venous: The inferior vena cava is normal in size with less than 50% respiratory variability, suggesting right atrial pressure of 8 mmHg.  IAS/Shunts: No atrial level shunt detected by color flow Doppler. No ventricular septal defect is seen or detected. There is no evidence of an atrial septal defect.     LEFT VENTRICLE PLAX 2D LVIDd:         4.30 cm  Diastology LVIDs:         3.40 cm  LV e' lateral:   9.57 cm/s LV PW:         1.20 cm  LV E/e' lateral: 12.2 LV IVS:        1.20 cm  LV e' medial:    7.51 cm/s LVOT diam:     1.80 cm  LV E/e' medial:  15.6 LV SV:         36 ml LV SV Index:   22.49    2D Longitudinal Strain LVOT Area:     2.54 cm 2D Strain GLS (A2C):   -13.3 %                         2D Strain GLS (A3C):   -14.8 %                         2D Strain GLS (A4C):   -14.0 %                         2D Strain GLS Avg:     -14.0 %  RIGHT VENTRICLE RV S prime:     9.25 cm/s TAPSE (M-mode): 1.6 cm  LEFT ATRIUM             Index       RIGHT ATRIUM           Index LA diam:        4.30 cm 2.64 cm/m  RA Area:     13.30 cm LA Vol (A2C):  60.9 ml 37.40 ml/m RA Volume:   30.00 ml  18.42 ml/m LA Vol (A4C):   57.1 ml 35.06 ml/m LA Biplane Vol: 59.5 ml 36.54 ml/m  AORTIC VALVE AV Area (Vmax):    2.19 cm AV Area (Vmean):   2.03 cm AV Area (VTI):     2.03 cm AV Vmax:           170.00 cm/s AV Vmean:          126.500 cm/s AV VTI:            0.312 m AV Peak Grad:      11.6 mmHg AV Mean Grad:      7.0 mmHg LVOT Vmax:         146.00 cm/s LVOT Vmean:        101.000 cm/s LVOT VTI:          0.248 m LVOT/AV VTI ratio: 0.80   AORTA Ao Root diam: 2.90 cm  MITRAL VALVE                         TRICUSPID VALVE MV Area (PHT): 3.60 cm              TR Peak grad:   33.6 mmHg MV PHT:        61.19 msec            TR Vmax:        290.00 cm/s MV Decel Time: 211 msec MV E velocity: 117.00 cm/s 103 cm/s  SHUNTS MV A velocity: 85.70 cm/s  70.3 cm/s Systemic VTI:  0.25 m MV E/A ratio:  1.37        1.5       Systemic Diam: 1.80 cm    Skeet Latch MD Electronically signed by Skeet Latch MD Signature Date/Time: 02/03/2019/9:44:41 PM    Treatments:   OPERATIVE REPORT  DATE OF PROCEDURE:  02/10/2019  PREOPERATIVE DIAGNOSIS:  Right pleural effusion and pericardial effusion.  POSTOPERATIVE DIAGNOSIS:  Right pleural effusion and pericardial effusion.  PROCEDURE:  Right video-assisted thoracoscopy, drainage of right pleural effusion, pericardial window.  SURGEON:  Modesto Charon, MD  ASSISTANT:  Enid Cutter, PA  ANESTHESIA:  General.  FINDINGS:  Transesophageal echocardiography showed a large pericardial effusion, near complete resolution after window, small right pleural effusion.  Both effusions serous in nature.    Blood pressure (!) 151/75, pulse 87, temperature 98.1 F (36.7 C), temperature source Oral, resp. rate 15, height _0  (1.727 m), weight 48.6 kg, SpO2 96 %.   General appearance: alert, cooperative and no distress Heart: regular rate and rhythm, S1, S2 normal, no murmur, click,  rub or gallop Lungs: clear to auscultation bilaterally Abdomen: soft, non-tender; bowel sounds normal; no masses,  no organomegaly Extremities: extremities normal, atraumatic, no cyanosis or edema Wound: clean and dry   Disposition: Discharge disposition: 01-Home or Self Care       Discharge Instructions    Discharge patient   Complete by: As directed    Discharge disposition: 01-Home or Self Care   Discharge patient date: 02/14/2019     Allergies as of 02/14/2019   No Known Allergies     Medication List    STOP taking these medications   colchicine 0.6 MG tablet   HYDROcodone-acetaminophen 10-325 MG tablet Commonly known as: NORCO     TAKE these medications   acetaminophen 500 MG tablet Commonly known as: TYLENOL Take 2 tablets (1,000 mg total) by mouth  every 6 (six) hours.   allopurinol 100 MG tablet Commonly known as: ZYLOPRIM TAKE 1 TABLET BY MOUTH EVERY DAY   ALPRAZolam 0.5 MG tablet Commonly known as: XANAX Take 0.5 mg by mouth daily as needed for anxiety.   fluticasone 50 MCG/ACT nasal spray Commonly known as: FLONASE Place 2 sprays into both nostrils daily.   macitentan 10 MG tablet Commonly known as: Opsumit Take 1 tablet (10 mg total) by mouth daily.   multivitamin with minerals tablet Take 1 tablet by mouth 3 (three) times a week.   nicotine 14 mg/24hr patch Commonly known as: NICODERM CQ - dosed in mg/24 hours Place 1 patch (14 mg total) onto the skin daily.   oxyCODONE 5 MG immediate release tablet Commonly known as: Oxy IR/ROXICODONE Take 1 tablet (5 mg total) by mouth every 6 (six) hours as needed for moderate pain.   potassium chloride SA 20 MEQ tablet Commonly known as: KLOR-CON Take 1 tablet (20 mEq total) by mouth daily. What changed: when to take this   sildenafil 20 MG tablet Commonly known as: REVATIO Take 1 tablet (20 mg total) by mouth 3 (three) times daily.   simvastatin 40 MG tablet Commonly known as: ZOCOR Take 1  tablet (40 mg total) by mouth daily. Daily in the morning   torsemide 20 MG tablet Commonly known as: DEMADEX Take 1 tablet (20 mg total) by mouth daily. What changed: when to take this      Follow-up Information    [name], Ravisankar, MD. Call in 1 day(s).   Specialty: Internal Medicine Contact information: [place] [address] [phone]        Lorretta Harp, MD .   Specialties: Cardiology, Radiology Contact information: [address] [place] [address] [phone]        Melrose Nakayama, MD Follow up.   Specialty: Cardiothoracic Surgery Why: Your routine follow-up appointment is on 02/25/2019 at 1:00pm. Please arrive at 12:30pm for a chest xray located at [facility] which is on the first floor of our building. Contact information: [address] [place] [address] [phone]        Bensimhon, Shaune Pascal, MD Follow up.   Specialty: Cardiology Why: Your follow-up appointment is on 04/09/2019 at 9:40am. Please bring your hospital paperwork. Contact information: [address] [place] [address] [phone]           Signed: Elgie Collard, PA-C 02/14/2019, 11:30 AM

## 2019-02-11 NOTE — Plan of Care (Signed)

## 2019-02-12 ENCOUNTER — Inpatient Hospital Stay (HOSPITAL_COMMUNITY): Payer: 59

## 2019-02-12 LAB — COMPREHENSIVE METABOLIC PANEL
ALT: 19 U/L (ref 0–44)
AST: 40 U/L (ref 15–41)
Albumin: 2.1 g/dL — ABNORMAL LOW (ref 3.5–5.0)
Alkaline Phosphatase: 76 U/L (ref 38–126)
Anion gap: 8 (ref 5–15)
BUN: 84 mg/dL — ABNORMAL HIGH (ref 6–20)
CO2: 20 mmol/L — ABNORMAL LOW (ref 22–32)
Calcium: 8.2 mg/dL — ABNORMAL LOW (ref 8.9–10.3)
Chloride: 112 mmol/L — ABNORMAL HIGH (ref 98–111)
Creatinine, Ser: 2.77 mg/dL — ABNORMAL HIGH (ref 0.61–1.24)
GFR calc Af Amer: 28 mL/min — ABNORMAL LOW (ref >60)
GFR calc non Af Amer: 24 mL/min — ABNORMAL LOW (ref >60)
Glucose, Bld: 103 mg/dL — ABNORMAL HIGH (ref 70–99)
Potassium: 5.3 mmol/L — ABNORMAL HIGH (ref 3.5–5.1)
Sodium: 140 mmol/L (ref 135–145)
Total Bilirubin: 0.4 mg/dL (ref 0.3–1.2)
Total Protein: 5.8 g/dL — ABNORMAL LOW (ref 6.5–8.1)

## 2019-02-12 LAB — CBC
HCT: 25.4 % — ABNORMAL LOW (ref 39.0–52.0)
Hemoglobin: 8.5 g/dL — ABNORMAL LOW (ref 13.0–17.0)
MCH: 29.7 pg (ref 26.0–34.0)
MCHC: 33.5 g/dL (ref 30.0–36.0)
MCV: 88.8 fL (ref 80.0–100.0)
Platelets: 215 10*3/uL (ref 150–400)
RBC: 2.86 MIL/uL — ABNORMAL LOW (ref 4.22–5.81)
RDW: 15.9 % — ABNORMAL HIGH (ref 11.5–15.5)
WBC: 13.9 10*3/uL — ABNORMAL HIGH (ref 4.0–10.5)
nRBC: 0 % (ref 0.0–0.2)

## 2019-02-12 IMAGING — DX DG CHEST 1V PORT
1 series · 1 of 1 positions shown · non-contrast
Comparison: Yesterday

CLINICAL DATA: Chest tube and pneumothorax

EXAM:
PORTABLE CHEST 1 VIEW

[chest ap]
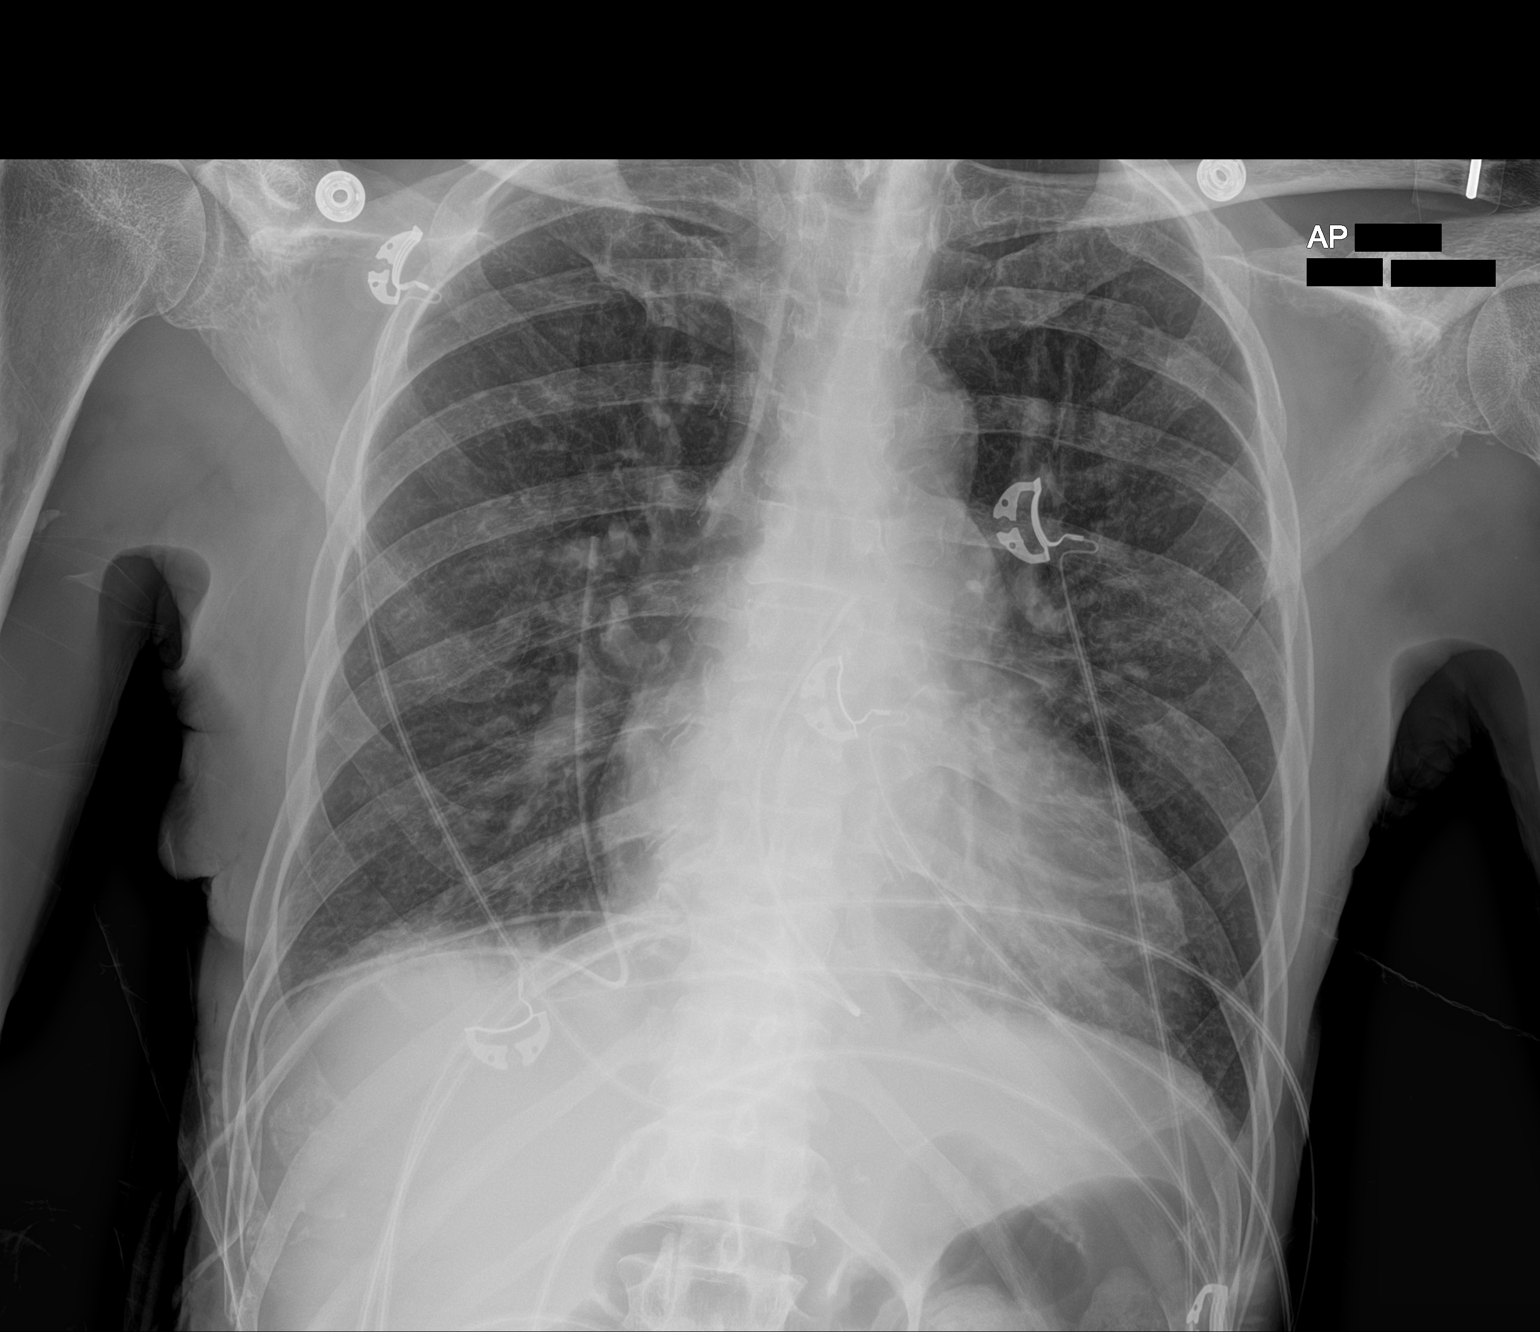

[1 of 1 positions shown; findings below may reference images not displayed]

FINDINGS: Right-sided chest tubes in place. No air bronchogram or Kerley
lines. No effusion or convincing pneumothorax. Stable normal for
technique heart size.
IMPRESSION: Stable exam with no convincing pneumothorax.

## 2019-02-12 NOTE — Progress Notes (Addendum)
2 Days Post-Op Procedure(s) (LRB): VIDEO ASSISTED THORACOSCOPY (Right) DRAINAGE OF PLEURAL EFFUSION (Right) PERICARDIAL WINDOW (N/A) Subjective: Awake and alert, no new concerns. Says pain control is better. Still using the PCA fairly frequently.   Objective: Vital signs in last 24 hours: Temp:  [97.4 F (36.3 C)-97.9 F (36.6 C)] 97.8 F (36.6 C) (10/21 0700) Pulse Rate:  [72-76] 72 (10/21 0700) Cardiac Rhythm: Normal sinus rhythm (10/21 0700) Resp:  [17-26] 20 (10/21 0700) BP: (110-135)/(65-77) 110/72 (10/21 0700) SpO2:  [94 %-99 %] 99 % (10/21 0700) Weight:  [60.1 kg] 60.1 kg (10/21 0434)      Intake/Output from previous day: 10/20 0701 - 10/21 0700 In: 1610 [P.O.:478; I.V.:879] Out: 905 [Urine:825; Chest Tube:80] Intake/Output this shift: No intake/output data recorded.  Physical Exam: General appearance: alert, cooperative and no distress Neurologic: intact Heart: RRR with HR 70's.  Monitor shows stable SR. Lungs: clear to auscultation bilaterally. 60m chest tube drainage documented for past 24 hours but drained 1065mduring exam after tubes straightened.   No air leak. CXR reviewed: Lungs remain well expanded.  Lung fields clear.  Extremities: No peripheral edema. Wound: The right chest incision is clean and dry, covered with Dermabond.   Lab Results: Recent Labs    02/11/19 0233 02/12/19 0247  WBC 14.5* 13.9*  HGB 8.9* 8.5*  HCT 26.4* 25.4*  PLT 248 215   BMET:  Recent Labs    02/11/19 0233 02/12/19 0247  NA 134* 140  K 4.9 5.3*  CL 102 112*  CO2 19* 20*  GLUCOSE 192* 103*  BUN 85* 84*  CREATININE 3.22* 2.77*  CALCIUM 8.1* 8.2*    PT/INR: No results for input(s): LABPROT, INR in the last 72 hours. ABG    Component Value Date/Time   PHART 7.431 02/11/2019 0341   HCO3 21.1 02/11/2019 0341   TCO2 21 (L) 02/10/2019 1211   ACIDBASEDEF 2.7 (H) 02/11/2019 0341   O2SAT 96.4 02/11/2019 0341   CBG (last 3)  No results for input(s): GLUCAP in the  last 72 hours.  Assessment/Plan: S/P Procedure(s) (LRB): VIDEO ASSISTED THORACOSCOPY (Right) DRAINAGE OF PLEURAL EFFUSION (Right) PERICARDIAL WINDOW (N/A)  -POD-2 right VATS for drainage of pleural effusion and pericardial window for recurrent pericardial effusion. 18077mrainage past 24 hours, no air leak. Leave pleural tubes another 24 hours.  Continue PCA until tubes removed.   -History of pulmonary HTN and right heart failure. Appreciate cardiology consult and management. Sildenafil and Opsumit resumed. Will defer diuretic mgt and IVF to cardiology.   -DVT PPX- Continue subQ Lovenox   -Anemia- Hct 21 immediately post-op despite minimal operative losses.Transfused 2 units PRBC's.  Hct reasonably stable past 24 hours.  Monitor.  -Stage III CKD-K+ 5.3 and UO 825m37m hours. Creat trending down today.  Monitor closely.   LOS: 2 days    MyroMalon [id number].[phone] Patient seen and examined, agree with above Will leave CT and pericardial drain in today Ambulate  StevRemo LippsHendRoxan Hockey [facility] ([phone]

## 2019-02-12 NOTE — Progress Notes (Addendum)
Advanced Heart Failure Rounding Note  PCP-Cardiologist: [name], MD   Subjective:     No dyspnea. Main complaint is that he has not been able to have a BM.    Objective:   Weight Range: 60.1 kg Body mass index is 20.15 kg/m.   Vital Signs:   Temp:  [97.6 F (36.4 C)-97.9 F (36.6 C)] 97.6 F (36.4 C) (10/21 1100) Pulse Rate:  [72-76] 75 (10/21 1100) Resp:  [17-26] 18 (10/21 1200) BP: (110-129)/(65-79) 124/79 (10/21 1100) SpO2:  [95 %-100 %] 100 % (10/21 1200) Weight:  [60.1 kg] 60.1 kg (10/21 0434) Last BM Date: 02/09/19  Weight change: Filed Weights   02/10/19 0749 02/12/19 0434  Weight: 49.9 kg 60.1 kg    Intake/Output:   Intake/Output Summary (Last 24 hours) at 02/12/2019 1522 Last data filed at 02/12/2019 1400 Gross per 24 hour  Intake 1720 ml  Output 560 ml  Net 1160 ml      Physical Exam    General:  Thin/ frail appearing AAM No resp difficulty HEENT: Normal Neck: Supple. JVP . Carotids 2+ bilat; no bruits. No lymphadenopathy or thyromegaly appreciated. Cor: PMI nondisplaced. Regular rate & rhythm. No rubs, gallops or murmurs. Lungs: Clear Abdomen: Soft, nontender, nondistended. No hepatosplenomegaly. No bruits or masses. Good bowel sounds. Extremities: No cyanosis, rash, edema. Diffuse scleroderma changes Neuro: Alert & orientedx3, cranial nerves grossly intact. moves all 4 extremities w/o difficulty. Affect pleasant   Telemetry   NSR 80s   EKG    N/A  Labs    CBC Recent Labs    02/11/19 0233 02/12/19 0247  WBC 14.5* 13.9*  HGB 8.9* 8.5*  HCT 26.4* 25.4*  MCV 87.1 88.8  PLT 248 364   Basic Metabolic Panel Recent Labs    02/11/19 0233 02/12/19 0247  NA 134* 140  K 4.9 5.3*  CL 102 112*  CO2 19* 20*  GLUCOSE 192* 103*  BUN 85* 84*  CREATININE 3.22* 2.77*  CALCIUM 8.1* 8.2*   Liver Function Tests Recent Labs    02/12/19 0247  AST 40  ALT 19  ALKPHOS 76  BILITOT 0.4  PROT 5.8*  ALBUMIN 2.1*   No  results for input(s): LIPASE, AMYLASE in the last 72 hours. Cardiac Enzymes No results for input(s): CKTOTAL, CKMB, CKMBINDEX, TROPONINI in the last 72 hours.  BNP: BNP (last 3 results) Recent Labs    08/13/18 1251 08/26/18 1037 02/03/19 1015  BNP 359.8* 342.8* 185.8*    ProBNP (last 3 results) No results for input(s): PROBNP in the last 8760 hours.   D-Dimer No results for input(s): DDIMER in the last 72 hours. Hemoglobin A1C No results for input(s): HGBA1C in the last 72 hours. Fasting Lipid Panel No results for input(s): CHOL, HDL, LDLCALC, TRIG, CHOLHDL, LDLDIRECT in the last 72 hours. Thyroid Function Tests No results for input(s): TSH, T4TOTAL, T3FREE, THYROIDAB in the last 72 hours.  Invalid input(s): FREET3  Other results:   Imaging    Dg Chest Port 1 View  Result Date: 02/12/2019 CLINICAL DATA:  Chest tube and pneumothorax EXAM: PORTABLE CHEST 1 VIEW COMPARISON:  Yesterday FINDINGS: Right-sided chest tubes in place. No air bronchogram or Kerley lines. No effusion or convincing pneumothorax. Stable normal for technique heart size. IMPRESSION: Stable exam with no convincing pneumothorax. Electronically Signed   By: Monte Fantasia M.D.   On: 02/12/2019 09:01      Medications:     Scheduled Medications: . acetaminophen  1,000 mg Oral  Q6H   Or  . acetaminophen (TYLENOL) oral liquid 160 mg/5 mL  1,000 mg Oral Q6H  . allopurinol  100 mg Oral Daily  . bisacodyl  10 mg Oral Daily  . enoxaparin (LOVENOX) injection  30 mg Subcutaneous Q24H  . feeding supplement (ENSURE ENLIVE)  237 mL Oral TID BM  . fluticasone  2 spray Each Nare Daily  . HYDROmorphone   Intravenous Q4H  . influenza vac split quadrivalent PF  0.5 mL Intramuscular Tomorrow-1000  . macitentan  10 mg Oral Daily  . nicotine  14 mg Transdermal Daily  . senna-docusate  1 tablet Oral QHS  . sildenafil  20 mg Oral TID  . simvastatin  40 mg Oral Daily     Infusions: . sodium chloride    .  sodium chloride 60 mL/hr at 02/12/19 1400     PRN Medications:  Place/Maintain arterial line **AND** sodium chloride, ALPRAZolam, diphenhydrAMINE **OR** diphenhydrAMINE, ipratropium-albuterol, naloxone **AND** sodium chloride flush, ondansetron (ZOFRAN) IV, ondansetron (ZOFRAN) IV, oxyCODONE, traMADol    Patient Profile   [name] is seen today for management of RV failure and PAH during the post operative period (POD#2 right VATS for drainage of right pleural effusion and pericardial window), at the request of Dr. [name], [facility].   Assessment/Plan    1. Large recurrent pericardial effusionand moderate right pleural effusion - s/p window in 2010 with Dr. Nilda Simmer developed large recurrent pericardial effusion w/o tamponade and moderate right sided pleural effusion requiring repeat intervention - s/p right VATS drainage of his pleural effusion and pericardial window 02/10/19. -Fluid analysis c/w transudative effusion  - POD#2. Pleural tubes drained an additional 149m serous fluid past 24 hours. No air leak. Per CT surgery, possible d/c of pleural tubes tomorrow  - Post op management per CT surgery.  3. 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 PA 81/26 (48), PCW 17, PVC 6.0 -Echo 02/03/19 showed moderately elevated right ventricular systolic pressure at 481.0mmHg. - Continue sildenafil 20 mg TID - Continue opsumit 10 mg daily - Will need to consider repeat RHC in near future to add selexipag  4. 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 - a bolus of IVFs 500 mL was given yesterday for hydration - volume appears stable. Will continue to hold diuretics today. Will monitor wt trend closely   4. CAD  - s/p previous RCA stent. - LHC 07/01/18: Non obstructive CAD with patent RCA stent. - Nos/s ischemiacurrently - Off ASA with GIB. Continue statin, simvastatin 40   5. Scleroderma - Follows with Dr. DEstanislado Pandy  6. Tobacco use -smoking 3/4 ppd. Encouraged cessation. No change.  7. 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 - Hgb dropped to 7.1 from surgical blood loss. S/p transfusion x 2 units. Hgb improved to 10.5, but down again to 8.9, c/w baseline . hgb stable today at 8.5  - follow closely   8. Acute on Chronic CKD: recent increase in SCr from 2.85>>3.22. Mostly likely due to aggressive diuresis over past 2 weeks.  -IVF bolus given and diuretics held -SCr improving, down to 2.77 today -Continue to hold diuretics. F/u BMP in the AM      Length of Stay: 2  Lyda Jester, PA-C  02/12/2019, 3:22 PM  Advanced Heart Failure Team Pager [phone] (M-F; 7a - 4p)  Please contact [facility] for night-coverage after hours (4p -7a ) and weekends on [email or web address]  Patient seen and examined with the above-signed Advanced Practice Provider and/or Housestaff. I personally reviewed laboratory data, imaging studies and relevant notes. I independently examined the patient and formulated the important aspects of the plan. I have edited the note to reflect any of my changes or salient points. I have personally discussed the plan with the patient and/or family.  Chest tube still in and draining sero-sanguinous fluid. Still with some pain at site. Breathing better. Creatinine improved with holding diuretics and IVF. hgb stable at 8.5 after blood transfusion. Will need to mobilize more. PT has seen. Continue to hold diuretics. Hopefully CT can come out soon.   Glori Bickers, MD  6:08 PM

## 2019-02-12 NOTE — Evaluation (Signed)
Physical Therapy Evaluation Patient Details Name: [name] MRN: [id number] DOB: [birth year] Today's Date: 02/12/2019   History of Present Illness  Pt is a 58 y.o. male admitted 02/10/19 with pericardial effusion, s/p R VATS drainage and pericardial window 10/19. PMH includes HTN, HLD, CAD, MI, CKD III, gout, Raynaud's, previous subxiphoid pericardial window (2010).    Clinical Impression  Pt presents with an overall decrease in functional mobility secondary to above. PTA, pt independent and lives with supportive wife available for assist. Today, pt required max encouragement to participate; able to transfer to/from [facility] with up to [place]. Pt adamant he will move more once chest tube removed despite repeated attempts to educate on importance of mobility even with chest tube in; expect pt to progress well. Pt would benefit from continued acute PT services to maximize functional mobility and independence prior to d/c home.     Follow Up Recommendations Home health PT;Supervision for mobility/OOB(may not need)    Equipment Recommendations  (TBD)    Recommendations for Other Services       Precautions / Restrictions Precautions Precautions: Fall Precaution Comments: R-side chest tube, PCA pump Restrictions Weight Bearing Restrictions: No      Mobility  Bed Mobility Overal bed mobility: Needs Assistance Bed Mobility: Supine to Sit;Sit to Supine     Supine to sit: Supervision;HOB elevated Sit to supine: Min assist;HOB elevated   General bed mobility comments: Pt adamant about HOB elevated; able to come to EOB with increased time, no physical assist; minA to assist BLEs into bed  Transfers Overall transfer level: Needs assistance Equipment used: None Transfers: Sit to/from [facility] Sit to Stand: Min guard Stand pivot transfers: Min guard       General transfer comment: Stand pivot from bed<>BSC with UE support on bed rail and min guard for balance;  increased time and effort, limited by pain  Ambulation/Gait             General Gait Details: Declining further mobility  Stairs            Wheelchair Mobility    Modified Rankin (Stroke Patients Only)       Balance Overall balance assessment: Needs assistance   Sitting balance-Leahy Scale: Fair       Standing balance-Leahy Scale: Fair                               Pertinent Vitals/Pain Pain Assessment: Faces Faces Pain Scale: Hurts little more Pain Location: Chest tube insertion site Pain Descriptors / Indicators: Grimacing;Guarding Pain Intervention(s): Limited activity within patient's tolerance;PCA encouraged    Home Living Family/patient expects to be discharged to:: Private residence Living Arrangements: Spouse/significant other Available Help at Discharge: Family;Available 24 hours/day Type of Home: House Home Access: Stairs to enter Entrance Stairs-Rails: Right Entrance Stairs-Number of Steps: 3-4 Home Layout: One level Home Equipment: None Additional Comments: Lives with wife who is retired Chartered certified accountant from [employer], also worked in wound care    Prior Function Level of Independence: Independent         Comments: Does not work.     Hand Dominance        Extremity/Trunk Assessment   Upper Extremity Assessment Upper Extremity Assessment: Generalized weakness    Lower Extremity Assessment Lower Extremity Assessment: Generalized weakness    Cervical / Trunk Assessment Cervical / Trunk Assessment: Kyphotic  Communication   Communication: No difficulties  Cognition Arousal/Alertness: Awake/alert Behavior  During Therapy: WFL for tasks assessed/performed Overall Cognitive Status: Within Functional Limits for tasks assessed                                 General Comments: WFL for tasks, but pt with poor insight into importance of mobility, continues to state "I'll move when the tube is out"      General  Comments General comments (skin integrity, edema, etc.): Wife present and supportive. Pt responded well to not being pushed, give and take with simple goals    Exercises     Assessment/Plan    PT Assessment Patient needs continued PT services  PT Problem List Decreased strength;Decreased activity tolerance;Decreased balance;Decreased mobility;Decreased knowledge of precautions;Cardiopulmonary status limiting activity       PT Treatment Interventions DME instruction;Gait training;Stair training;Functional mobility training;Therapeutic activities;Therapeutic exercise;Balance training;Patient/family education    PT Goals (Current goals can be found in the Care Plan section)  Acute Rehab PT Goals Patient Stated Goal: "I'll walk fine when the tube is out" PT Goal Formulation: With patient Time For Goal Achievement: 02/26/19 Potential to Achieve Goals: Good    Frequency Min 3X/week   Barriers to discharge        Co-evaluation               AM-PAC PT "6 Clicks" Mobility  Outcome Measure Help needed turning from your back to your side while in a flat bed without using bedrails?: None Help needed moving from lying on your back to sitting on the side of a flat bed without using bedrails?: A Little Help needed moving to and from a bed to a chair (including a wheelchair)?: A Little Help needed standing up from a chair using your arms (e.g., wheelchair or bedside chair)?: A Little Help needed to walk in hospital room?: A Little Help needed climbing 3-5 steps with a railing? : A Little 6 Click Score: 19    End of Session   Activity Tolerance: Patient tolerated treatment well;Patient limited by pain Patient left: in bed;with call bell/phone within reach;with family/visitor present Nurse Communication: Mobility status PT Visit Diagnosis: Other abnormalities of gait and mobility (R26.89)    Time: 0998-3382 PT Time Calculation (min) (ACUTE ONLY): 39 min   Charges:   PT  Evaluation $PT Eval Moderate Complexity: 1 Mod PT Treatments $Therapeutic Activity: 23-37 mins      Mabeline Caras, PT, DPT Acute Rehabilitation Services  Pager [phone] Office [place] 02/12/2019, 5:10 PM

## 2019-02-12 NOTE — Plan of Care (Signed)

## 2019-02-13 ENCOUNTER — Other Ambulatory Visit (HOSPITAL_COMMUNITY): Payer: 59

## 2019-02-13 ENCOUNTER — Inpatient Hospital Stay (HOSPITAL_COMMUNITY): Payer: 59

## 2019-02-13 ENCOUNTER — Encounter (HOSPITAL_COMMUNITY): Payer: 59 | Admitting: Internal Medicine

## 2019-02-13 DIAGNOSIS — I2721 Secondary pulmonary arterial hypertension: Secondary | ICD-10-CM

## 2019-02-13 LAB — BASIC METABOLIC PANEL
Anion gap: 9 (ref 5–15)
BUN: 77 mg/dL — ABNORMAL HIGH (ref 6–20)
CO2: 19 mmol/L — ABNORMAL LOW (ref 22–32)
Calcium: 8.4 mg/dL — ABNORMAL LOW (ref 8.9–10.3)
Chloride: 114 mmol/L — ABNORMAL HIGH (ref 98–111)
Creatinine, Ser: 2.19 mg/dL — ABNORMAL HIGH (ref 0.61–1.24)
GFR calc Af Amer: 37 mL/min — ABNORMAL LOW (ref >60)
GFR calc non Af Amer: 32 mL/min — ABNORMAL LOW (ref >60)
Glucose, Bld: 73 mg/dL (ref 70–99)
Potassium: 4.9 mmol/L (ref 3.5–5.1)
Sodium: 142 mmol/L (ref 135–145)

## 2019-02-13 LAB — CBC
HCT: 27.5 % — ABNORMAL LOW (ref 39.0–52.0)
Hemoglobin: 8.7 g/dL — ABNORMAL LOW (ref 13.0–17.0)
MCH: 28.8 pg (ref 26.0–34.0)
MCHC: 31.6 g/dL (ref 30.0–36.0)
MCV: 91.1 fL (ref 80.0–100.0)
Platelets: 240 10*3/uL (ref 150–400)
RBC: 3.02 MIL/uL — ABNORMAL LOW (ref 4.22–5.81)
RDW: 16.3 % — ABNORMAL HIGH (ref 11.5–15.5)
WBC: 10.6 10*3/uL — ABNORMAL HIGH (ref 4.0–10.5)
nRBC: 0 % (ref 0.0–0.2)

## 2019-02-13 NOTE — Progress Notes (Addendum)
[address]       [place] [address]             [phone]      3 Days Post-Op Procedure(s) (LRB): VIDEO ASSISTED THORACOSCOPY (Right) DRAINAGE OF PLEURAL EFFUSION (Right) PERICARDIAL WINDOW (N/A) Subjective: Feels okay this morning. Unhappy with the care he received yesterday.   Objective: Vital signs in last 24 hours: Temp:  [97.6 F (36.4 C)-97.8 F (36.6 C)] 97.8 F (36.6 C) (10/22 0256) Pulse Rate:  [75-81] 78 (10/22 0256) Cardiac Rhythm: Normal sinus rhythm (10/22 0700) Resp:  [17-26] 22 (10/22 0305) BP: (119-140)/(75-84) 140/84 (10/22 0256) SpO2:  [98 %-100 %] 100 % (10/22 0305) Weight:  [61 kg] 61 kg (10/22 0256)     Intake/Output from previous day: 10/21 0701 - 10/22 0700 In: 1840 [P.O.:580; I.V.:1260] Out: 980 [Urine:700; Chest Tube:280] Intake/Output this shift: Total I/O In: -  Out: 250 [Urine:250]  General appearance: alert, cooperative and no distress Heart: regular rate and rhythm, S1, S2 normal, no murmur, click, rub or gallop Lungs: clear to auscultation bilaterally Abdomen: soft, non-tender; bowel sounds normal; no masses,  no organomegaly Extremities: extremities normal, atraumatic, no cyanosis or edema Wound: clean and dry  Lab Results: Recent Labs    02/12/19 0247 02/13/19 0239  WBC 13.9* 10.6*  HGB 8.5* 8.7*  HCT 25.4* 27.5*  PLT 215 240   BMET:  Recent Labs    02/12/19 0247 02/13/19 0239  NA 140 142  K 5.3* 4.9  CL 112* 114*  CO2 20* 19*  GLUCOSE 103* 73  BUN 84* 77*  CREATININE 2.77* 2.19*  CALCIUM 8.2* 8.4*    PT/INR: No results for input(s): LABPROT, INR in the last 72 hours. ABG    Component Value Date/Time   PHART 7.431 02/11/2019 0341   HCO3 21.1 02/11/2019 0341   TCO2 21 (L) 02/10/2019 1211   ACIDBASEDEF 2.7 (H) 02/11/2019 0341   O2SAT 96.4 02/11/2019 0341   CBG (last 3)  No results for input(s): GLUCAP in the last 72 hours.  Assessment/Plan: S/P Procedure(s) (LRB): VIDEO  ASSISTED THORACOSCOPY (Right) DRAINAGE OF PLEURAL EFFUSION (Right) PERICARDIAL WINDOW (N/A)   -POD-3 right VATS for drainage of pleural effusion and pericardial window for recurrent pericardial effusion. 238m drainage past 24 hours, no air leak.  Continue PCA until tubes removed.   -History of pulmonary HTN and right heart failure. Appreciate cardiology consult and management. Sildenafil and Opsumit resumed. Will defer diuretic mgt and IVF to cardiology.   -DVT PPX- Continue subQ Lovenox   -Anemia- Hct 21 immediately post-op despite minimal operative losses.Transfused 2 units PRBC's.  Hct reasonably stable past 24 hours. 8.7/27.5 Monitor.  -Stage III CKD-K+ 4.9 and UO 7047m24 hours. Creat trending down today.  Monitor closely.  Plan:  Will check on chest tubes later today and if not much output will discontinue at that point. Continue ambulation in the halls today. Continue incentive spirometer.    LOS: 3 days    TeElgie Collard0/22/2020  Patient seen and examined, agree with above Increased drainage, mostly from manipulating tubes early yesterday AM. If drainage remains low today will dc CTs this afternoon  StRemo Lipps. HeRoxan HockeyMD [facility] [phone]

## 2019-02-13 NOTE — Progress Notes (Signed)
Chest tubes DC per order. No complications noted. Pt resting in bed on RA. VSS. Spouse at bedside. Pt tolerated well.

## 2019-02-13 NOTE — Progress Notes (Signed)
[address]       [place], [address]             [phone]       Patient has only had 40cc out since 8am. Will discontinue chest tubes. Discussed with Drema Dallas, RN.   Nicholes Rough, PA-C

## 2019-02-13 NOTE — Plan of Care (Signed)

## 2019-02-13 NOTE — Progress Notes (Signed)
Physical Therapy Treatment Patient Details Name: [name] MRN: [id number] DOB: 1961-04-01 Today's Date: 02/13/2019    History of Present Illness Pt is a 58 y.o. male admitted 02/10/19 with pericardial effusion, s/p R VATS drainage and pericardial window 10/19. PMH includes HTN, HLD, CAD, MI, CKD III, gout, Raynaud's, previous subxiphoid pericardial window (2010).   PT Comments    Pt progressing well with mobility. Agreeable to ambulate now that chest tube removed. Independent with mobility. Wife present and supportive. Encouraged continued ambulation during hospital admission. Has met short-term acute PT goals. Will d/c PT.    Follow Up Recommendations  No PT follow up;Supervision - Intermittent     Equipment Recommendations  None recommended by PT    Recommendations for Other Services       Precautions / Restrictions Precautions Precautions: Fall Restrictions Weight Bearing Restrictions: No    Mobility  Bed Mobility Overal bed mobility: Modified Independent Bed Mobility: Supine to Sit;Sit to Supine           General bed mobility comments: HOB elevated, increased time/effort  Transfers Overall transfer level: Independent Equipment used: None                Ambulation/Gait Ambulation/Gait assistance: Independent Gait Distance (Feet): 350 Feet Assistive device: None Gait Pattern/deviations: Step-through pattern;Decreased stride length     General Gait Details: Slow, steady gait without DME   Stairs Stairs: (pt declined)           Wheelchair Mobility    Modified Rankin (Stroke Patients Only)       Balance                                            Cognition Arousal/Alertness: Awake/alert Behavior During Therapy: WFL for tasks assessed/performed Overall Cognitive Status: Within Functional Limits for tasks assessed                                        Exercises      General Comments         Pertinent Vitals/Pain Pain Assessment: Faces Faces Pain Scale: Hurts a little bit Pain Location: Chest tube insertion site post-removal Pain Descriptors / Indicators: Sore Pain Intervention(s): Monitored during session    Home Living                      Prior Function            PT Goals (current goals can now be found in the care plan section) Progress towards PT goals: Goals met/education completed, patient discharged from PT    Frequency    Min 3X/week      PT Plan Discharge plan needs to be updated    Co-evaluation              AM-PAC PT "6 Clicks" Mobility   Outcome Measure  Help needed turning from your back to your side while in a flat bed without using bedrails?: None Help needed moving from lying on your back to sitting on the side of a flat bed without using bedrails?: None Help needed moving to and from a bed to a chair (including a wheelchair)?: None Help needed standing up from a chair using your arms (e.g., wheelchair or bedside chair)?: None Help  needed to walk in hospital room?: None Help needed climbing 3-5 steps with a railing? : A Little 6 Click Score: 23    End of Session   Activity Tolerance: Patient tolerated treatment well Patient left: in bed;with call bell/phone within reach;with family/visitor present Nurse Communication: Mobility status PT Visit Diagnosis: Other abnormalities of gait and mobility (R26.89)     Time: 8250-0370 PT Time Calculation (min) (ACUTE ONLY): 15 min  Charges:  $Gait Training: 8-22 mins                     Mabeline Caras, PT, DPT Acute Rehabilitation Services  Pager [phone] Office [place] 02/13/2019, 5:53 PM

## 2019-02-13 NOTE — Progress Notes (Addendum)
Advanced Heart Failure Rounding Note  PCP-Cardiologist: [name], MD   Subjective:    No resting dyspnea. Poor appetite. Still no BM.  Wants to go home    Objective:   Weight Range: 61 kg Body mass index is 20.45 kg/m.   Vital Signs:   Temp:  [97.6 F (36.4 C)-97.8 F (36.6 C)] 97.8 F (36.6 C) (10/22 0256) Pulse Rate:  [75-81] 78 (10/22 0256) Resp:  [17-26] 22 (10/22 0305) BP: (119-140)/(75-84) 140/84 (10/22 0256) SpO2:  [98 %-100 %] 100 % (10/22 0305) Weight:  [61 kg] 61 kg (10/22 0256) Last BM Date: 02/12/19  Weight change: Filed Weights   02/10/19 0749 02/12/19 0434 02/13/19 0256  Weight: 49.9 kg 60.1 kg 61 kg    Intake/Output:   Intake/Output Summary (Last 24 hours) at 02/13/2019 0747 Last data filed at 02/13/2019 0719 Gross per 24 hour  Intake 1840 ml  Output 1230 ml  Net 610 ml      Physical Exam    PHYSICAL EXAM: General:  Thin frail appearing AAM w/ scleroderma. No respiratory difficulty HEENT: normal Neck: supple. no JVD. Carotids 2+ bilat; no bruits. No lymphadenopathy or thyromegaly appreciated. Cor: PMI nondisplaced. Regular rate & rhythm. No rubs, gallops or murmurs. Lungs: clear. Chest tubes remain in place Abdomen: soft, nontender, nondistended. No hepatosplenomegaly. No bruits or masses. Good bowel sounds. Extremities: no cyanosis, clubbing, rash, edema, diffuse scleroderma changes  Neuro: alert & oriented x 3, cranial nerves grossly intact. moves all 4 extremities w/o difficulty. Affect pleasant.   Telemetry   NSR 90s   EKG    N/A  Labs    CBC Recent Labs    02/12/19 0247 02/13/19 0239  WBC 13.9* 10.6*  HGB 8.5* 8.7*  HCT 25.4* 27.5*  MCV 88.8 91.1  PLT 215 977   Basic Metabolic Panel Recent Labs    02/12/19 0247 02/13/19 0239  NA 140 142  K 5.3* 4.9  CL 112* 114*  CO2 20* 19*  GLUCOSE 103* 73  BUN 84* 77*  CREATININE 2.77* 2.19*  CALCIUM 8.2* 8.4*   Liver Function Tests Recent Labs    02/12/19  0247  AST 40  ALT 19  ALKPHOS 76  BILITOT 0.4  PROT 5.8*  ALBUMIN 2.1*   No results for input(s): LIPASE, AMYLASE in the last 72 hours. Cardiac Enzymes No results for input(s): CKTOTAL, CKMB, CKMBINDEX, TROPONINI in the last 72 hours.  BNP: BNP (last 3 results) Recent Labs    08/13/18 1251 08/26/18 1037 02/03/19 1015  BNP 359.8* 342.8* 185.8*    ProBNP (last 3 results) No results for input(s): PROBNP in the last 8760 hours.   D-Dimer No results for input(s): DDIMER in the last 72 hours. Hemoglobin A1C No results for input(s): HGBA1C in the last 72 hours. Fasting Lipid Panel No results for input(s): CHOL, HDL, LDLCALC, TRIG, CHOLHDL, LDLDIRECT in the last 72 hours. Thyroid Function Tests No results for input(s): TSH, T4TOTAL, T3FREE, THYROIDAB in the last 72 hours.  Invalid input(s): FREET3  Other results:   Imaging    No results found.   Medications:     Scheduled Medications: . acetaminophen  1,000 mg Oral Q6H   Or  . acetaminophen (TYLENOL) oral liquid 160 mg/5 mL  1,000 mg Oral Q6H  . allopurinol  100 mg Oral Daily  . bisacodyl  10 mg Oral Daily  . enoxaparin (LOVENOX) injection  30 mg Subcutaneous Q24H  . feeding supplement (ENSURE ENLIVE)  237 mL Oral  TID BM  . fluticasone  2 spray Each Nare Daily  . HYDROmorphone   Intravenous Q4H  . influenza vac split quadrivalent PF  0.5 mL Intramuscular Tomorrow-1000  . macitentan  10 mg Oral Daily  . nicotine  14 mg Transdermal Daily  . senna-docusate  1 tablet Oral QHS  . sildenafil  20 mg Oral TID  . simvastatin  40 mg Oral Daily    Infusions: . sodium chloride    . sodium chloride 60 mL/hr at 02/13/19 0216    PRN Medications: Place/Maintain arterial line **AND** sodium chloride, ALPRAZolam, diphenhydrAMINE **OR** diphenhydrAMINE, ipratropium-albuterol, naloxone **AND** sodium chloride flush, ondansetron (ZOFRAN) IV, ondansetron (ZOFRAN) IV, oxyCODONE, traMADol    Patient Profile   [name] is seen today for management of RV failure and PAH during the post operative period (POD#3 right VATS for drainage of right pleural effusion and pericardial window), at the request of Dr. [name], [facility].   Assessment/Plan    1. Large recurrent pericardial effusionand moderate right pleural effusion - s/p window in 2010 with Dr. Nilda Simmer developed large recurrent pericardial effusion w/o tamponade and moderate right sided pleural effusion requiring repeat intervention - s/p right VATS drainage of his pleural effusion and pericardial window 02/10/19. -Fluid analysis c/w transudative effusion  - POD#3. higher chest tube output yesterday. Tubes will remain in place for now. CT surgery to reassess this afternoon. Possible d/c of pleural tubes later today or tomorrow  - Post op management per CT surgery.  3. 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 PA 81/26 (48), PCW 17, PVC 6.0 -Echo 02/03/19 showed moderately elevated right ventricular systolic pressure at 02.5 mmHg. - Continue sildenafil 20 mg TID - Continue opsumit 10 mg daily - Will need to consider repeat RHC in near future to add selexipag  4. 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 - a bolus of IVFs 500 mL was given 10/20 for hydration - volume appears stable. Will continue to hold diuretics today. Will monitor wt trend closely   4. CAD - s/p previous RCA stent. - LHC 07/01/18: Non obstructive CAD with patent RCA stent. - Nos/s ischemiacurrently - Off ASA with GIB. Continue statin, simvastatin 40   5. Scleroderma - Follows with Dr. Estanislado Pandy   6. Tobacco use -smoking 3/4 ppd. Encouraged cessation. No change.  7. 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 - Hgb dropped to 7.1 from surgical blood loss. S/p transfusion x 2 units. Hgb improved to 10.5, but dropped back down again to 8.9, c/w baseline . Hgb has been stable over the last 72 hrs, 8.7 today   8. Acute on Chronic CKD: recent increase in SCr from 2.85>>3.22. Mostly likely due to aggressive diuresis over past 2 weeks.  -IVF bolus given post op and diuretics held -SCr improving, down 3.22>>2.77>>2.19 (close to baseline) -Volume looks ok. Continue to hold diuretics. F/u BMP in the AM      Length of Stay: [place], PA-C  02/13/2019, 7:47 AM  Advanced Heart Failure Team Pager [phone] (M-F; [place])  Please contact [facility] for night-coverage after hours (4p -7a ) and weekends on [email or web address]  7:47 AM   Patient seen and  examined with the above-signed Advanced Practice Provider and/or Housestaff. I personally reviewed laboratory data, imaging studies and relevant notes. I independently examined the patient and formulated the important aspects of the plan. I have edited the note to reflect any of my changes or salient points. I have personally discussed the plan with the patient and/or family.  CT drainage seems to have slowed. Volume status ok. Creatinine improved. Has a bit of hyperchloremia. May be wasting bicarb.   [place] for d/c from our standpoint when CT out. Would send home on torsemide 20 daily + home PAH meds.   Glori Bickers, MD  9:24 AM

## 2019-02-14 ENCOUNTER — Inpatient Hospital Stay (HOSPITAL_COMMUNITY): Payer: 59

## 2019-02-14 LAB — TYPE AND SCREEN
ABO/RH(D): A POS
Antibody Screen: NEGATIVE
Unit division: 0
Unit division: 0
Unit division: 0
Unit division: 0

## 2019-02-14 LAB — BPAM RBC
Blood Product Expiration Date: 202011112359
Blood Product Expiration Date: 202011112359
Blood Product Expiration Date: 202011112359
Blood Product Expiration Date: 202011112359
ISSUE DATE / TIME: 202010191017
ISSUE DATE / TIME: 202010191017
Unit Type and Rh: 6200
Unit Type and Rh: 6200
Unit Type and Rh: 6200
Unit Type and Rh: 6200

## 2019-02-14 LAB — PATHOLOGIST SMEAR REVIEW

## 2019-02-14 IMAGING — DX DG CHEST 1V PORT
1 series · 1 of 1 positions shown · non-contrast
Comparison: Chest x-ray [DATE].

CLINICAL DATA: 58-year-old male status post chest tube removal
yesterday.

EXAM:
PORTABLE CHEST 1 VIEW

[chest]
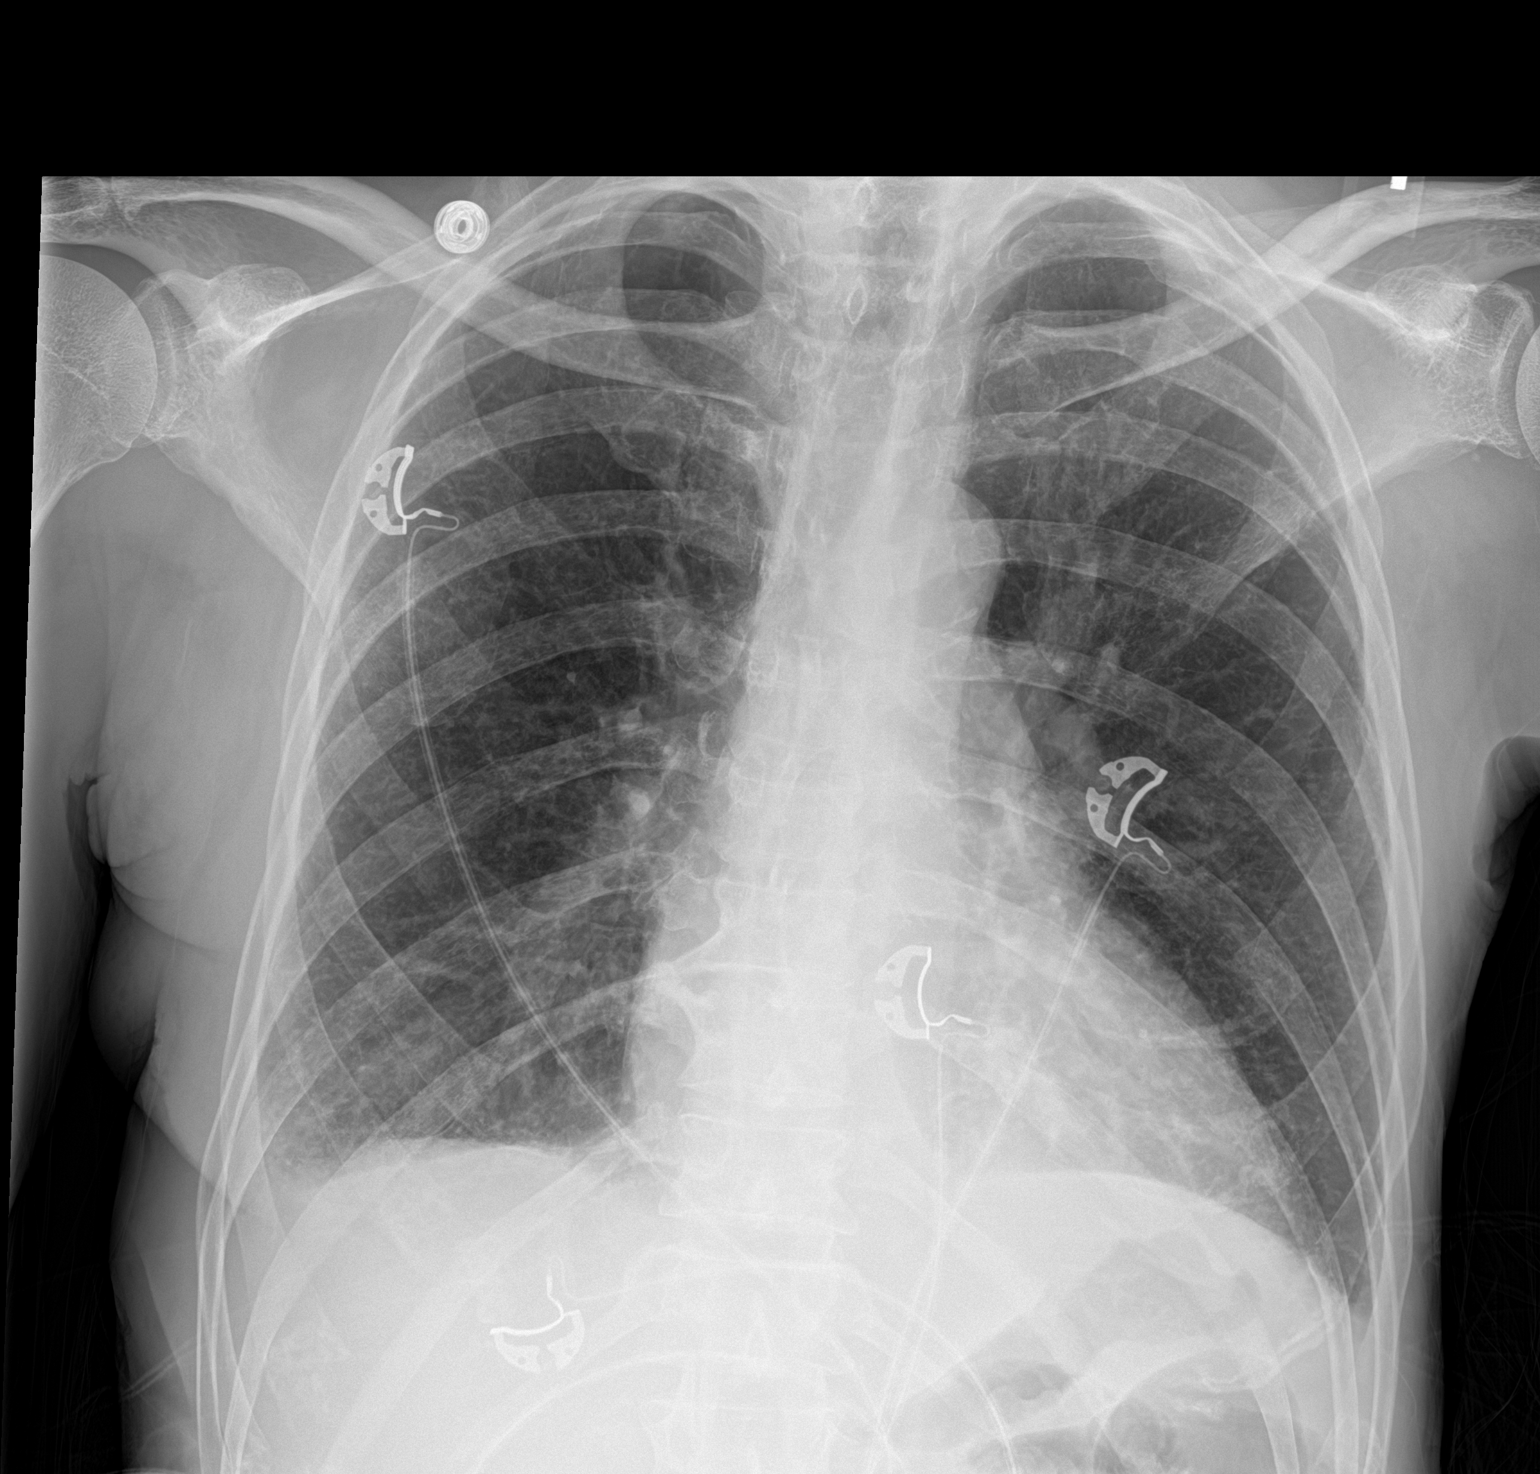

[1 of 1 positions shown; findings below may reference images not displayed]

FINDINGS: Trace right apical pneumothorax noted on the prior study is no
longer confidently identified. No acute consolidative airspace
disease. Small right pleural effusion. No left pleural effusion. No
evidence of pulmonary edema. Heart size is normal. Upper mediastinal
contours are within normal limits. Aortic atherosclerosis.
IMPRESSION: 1. Previously noted trace right-sided pneumothorax has resolved.
2. Small right pleural effusion.
3. Aortic atherosclerosis.

## 2019-02-14 MED ORDER — NICOTINE 14 MG/24HR TD PT24: 14.0000 mg | MEDICATED_PATCH | Freq: Every day | TRANSDERMAL | 0 refills | Status: DC

## 2019-02-14 MED ORDER — POTASSIUM CHLORIDE CRYS ER 20 MEQ PO TBCR: 20.0000 meq | EXTENDED_RELEASE_TABLET | Freq: Every day | ORAL | 3 refills | Status: DC

## 2019-02-14 MED ORDER — OXYCODONE HCL 5 MG PO TABS: 5.0000 mg | ORAL_TABLET | Freq: Four times a day (QID) | ORAL | 0 refills | Status: DC | PRN

## 2019-02-14 MED ORDER — ACETAMINOPHEN 500 MG PO TABS: 1000.0000 mg | ORAL_TABLET | Freq: Four times a day (QID) | ORAL | 0 refills | Status: DC

## 2019-02-14 MED ORDER — TORSEMIDE 20 MG PO TABS: 20.0000 mg | ORAL_TABLET | Freq: Every day | ORAL | 6 refills | Status: DC

## 2019-02-14 NOTE — Discharge Instructions (Signed)
Discharge Instructions:  1. You may shower, please wash incisions daily with soap and water and keep dry.  If you wish to cover wounds with dressing you may do so but please keep clean and change daily.  No tub baths or swimming until incisions have completely healed.  If your incisions become red or develop any drainage please call our office at [phone]  2. No Driving until cleared by Dr. Leonarda Salon office and you are no longer taking pain medications.   3. Monitor your weight daily.. Please use the same scale and weigh at same time... If you gain 3-5 lbs in 48 hours with associated lower extremity swelling, please contact our office at [phone]  4. Fever of 101.5 for at least 24 hours with no source, please contact our office at [phone]  5. Activity- up as tolerated, please walk at least 3 times per day.  Avoid strenuous activity, no lifting, pushing, or pulling with your arms over 8-10 lbs for a minimum of 6 weeks  6. If any questions or concerns arise, please do not hesitate to contact our office at [phone]   Pericardial Effusion  Pericardial effusion is a buildup of fluid around the heart. The heart is surrounded by a thin, double-layered sac (pericardium). When fluid builds up in this sac, it can put pressure on the heart and cause problems. When fluid builds up in the pericardial sac and pressure on the heart increases, it becomes harder for the heart to pump blood. The fluid can prevent the heart from pumping enough blood (cardiac tamponade). This can be life-threatening. What are the causes? Often, the cause of pericardial effusion is not known (idiopathic effusion). In some cases, the condition may be caused by:  Infections from a virus, fungus, parasite, or bacteria.  Damage to the pericardium from heart surgery or a heart attack.  Inflammatory diseases, such as rheumatoid arthritis or lupus.  Kidney disease.  Thyroid disease.  Cancer or treatment for  cancer, including radiation or chemotherapy.  Certain medicines, including medicines for tuberculosis or seizures.  Chest injury. What are the signs or symptoms? Pericardial effusion may not cause symptoms at first, especially if the fluid builds up slowly. In time, pressure on the heart may cause:  Chest pain.  Trouble with breathing.  Pain and shortness of breath that get worse when lying down.  Dizziness.  Fainting.  Cough.  Hiccups.  Skipped heartbeats (palpitations).  Anxiety and confusion.  A bluish skin color (cyanosis).  Swollen legs and ankles.  A feeling of fullness in the chest. How is this diagnosed? This condition is diagnosed based on your symptoms and testing, which may include:  A test that creates ultrasound images of your heart (echocardiogram).  A test to examine the electrical functions of your heart (electrocardiogram, ECG).  Chest X-ray.  CT scan.  MRI.  Blood tests. How is this treated? Treatment for this condition depends on the cause of your condition and how severe your symptoms are. Treatment may include:  Medicines, such as: ? NSAIDs or other anti-inflammatory medicines such as steroids. ? Antibiotic medicine. ? Antifungal medicine.  Hospital treatment. This may be necessary for cardiac tamponade. Treatment in the hospital may include: ? IV fluids. ? Breathing support.  Surgery. This may be needed in severe cases. Surgery may include: ? A procedure to remove fluid from the pericardium by placing a needle into it (pericardiocentesis). ? A procedure to make a permanent opening in the pericardium (pericardial window). ? Open heart surgery.  Follow these instructions at home:  Take over-the-counter and prescription medicines only as told by your health care provider.  If you were prescribed antibiotic medicine, take it as told by your health care provider. Do not stop taking the antibiotic even if you start to feel  better.  Rest as told by your health care provider. Ask your health care provider what activities are safe for you.  Keep all follow-up visits as told by your health care provider. This is important. Contact a health care provider if:  You have a cough or hiccups that do not go away.  You have severe swelling in your legs or ankles. Get help right away if:  You have fast or irregular heartbeats (palpitations).  You feel dizzy or light-headed.  You faint.  You have chest pain.  You have trouble breathing. These symptoms may represent a serious problem that is an emergency. Do not wait to see if the symptoms will go away. Get medical help right away. Call your local emergency services (911 in the [place] Do not drive yourself to the hospital. Summary  Pericardial effusion is a buildup of fluid around the heart. The fluid can eventually prevent the heart from pumping enough blood (cardiac tamponade), which can be life-threatening.  Pericardial effusion may not cause symptoms at first.  Treatment for pericardial effusion depends on the cause of your condition and how severe your symptoms are. In severe cases, hospital treatment or surgery may be required.  Rest as told by your health care provider. Ask your health care provider what activities are safe for you. This information is not intended to replace advice given to you by your health care provider. Make sure you discuss any questions you have with your health care provider. Document Released: 12/06/2004 Document Revised: 03/23/2017 Document Reviewed: 05/18/2016 Elsevier Patient Education  2020 [employer].  office and you are no longer using narcotic pain medications

## 2019-02-14 NOTE — Consult Note (Signed)
[facility] CM Inpatient Consult   02/14/2019  CALYN RUBI [birth year] [id number]   Patient screened for high risk score for unplanned readmission and to check if potential [facility] services.  Patient in the [employer]. Patient with 3 admissions in the past 6 months.   Review of patient's medical record reveals patient is s/p right VATs.  Primary Care Provider is  [name], Ravisankar, MD and this office provides the TOC follow up.  Ph[facility] [address] Transportation to provider: wife  Plan: Spoke with wife, HIPAA verified, states patient is getting ready for home.  She denies any follow up needs for post hospital stay.  She did accept [facility] information to be sent by mail for future needs.  Currently, declines services.  Please place a [facility] Management consult as appropriate and for questions contact:   Natividad Brood, RN BSN [facility] Liaison  [phone] business mobile phone Toll free office [phone]  Fax number: [phone] [email or web address]

## 2019-02-14 NOTE — TOC Transition Note (Signed)
Transition of Care [facility]) - CM/SW Discharge Note   Patient Details  Name: [name] MRN: [id number] Date of Birth: 1960/09/28  [facility] of [facility]) CM/SW Contact:  Zenon Mayo, RN Phone Number: 02/14/2019, 1:43 PM   Clinical Narrative:    Patient for dc home today, per pt eval no pt f/u needed.  Patient wife would like bsc and hospital bed, she would like [employer] to supply this , referral given to Zack, the DME will be delivered to patient's home which is what wife asked for.    Final next level of care: Home/Self Care Barriers to Discharge: No Barriers Identified   Patient Goals and CMS Choice Patient states their goals for this hospitalization and ongoing recovery are:: go home CMS Medicare.gov Compare Post Acute Care list provided to:: Patient Represenative (must comment)(wife) Choice offered to / list presented to : NA  Discharge Placement                       Discharge Plan and Services In-house Referral: NA Discharge Planning Services: CM Consult Post Acute Care Choice: NA          DME Arranged: Hospital bed, Bedside commode DME Agency: [facility] Date DME Agency Contacted: 02/14/19 Time DME Agency Contacted: [phone] Representative spoke with at DME Agency: zack HH Arranged: NA          Social Determinants of Health ([place]) Interventions     Readmission Risk Interventions Readmission Risk Prevention Plan 02/11/2019  Transportation Screening Complete  PCP or Specialist Appt within 3-5 Days Complete  HRI or [place] Complete  Social Work Consult for [place] Planning/Counseling Complete  Palliative Care Screening Not Applicable  Medication Review Press photographer) Complete  Some recent data might be hidden

## 2019-02-14 NOTE — Progress Notes (Signed)
Durable Medical Equipment  (From admission, onward)         Start     Ordered   02/14/19 1334  For home use only DME Bedside commode  Once    Question:  Patient needs a bedside commode to treat with the following condition  Answer:  Weakness   02/14/19 1333   02/14/19 1334  For home use only DME Hospital bed  Once    Question Answer Comment  Length of Need Lifetime   Patient has (list medical condition): Heart Failure, pulm htn   The above medical condition requires: Patient requires the ability to reposition frequently   Head must be elevated greater than: 30 degrees   Bed type Semi-electric   Hoyer Lift Yes   Support Surface: Gel Overlay      02/14/19 1336

## 2019-02-14 NOTE — Plan of Care (Addendum)
  Problem: Education: Goal: Knowledge of General Education information will improve Description: Including pain rating scale, medication(s)/side effects and non-pharmacologic comfort measures Outcome: Adequate for Discharge   Problem: Health Behavior/Discharge Planning: Goal: Ability to manage health-related needs will improve Outcome: Adequate for Discharge   Problem: Clinical Measurements: Goal: Ability to maintain clinical measurements within normal limits will improve Outcome: Adequate for Discharge Goal: Will remain free from infection Outcome: Adequate for Discharge Goal: Diagnostic test results will improve Outcome: Adequate for Discharge Goal: Respiratory complications will improve Outcome: Adequate for Discharge Goal: Cardiovascular complication will be avoided Outcome: Adequate for Discharge   Problem: Activity: Goal: Risk for activity intolerance will decrease Outcome: Adequate for Discharge   Problem: Nutrition: Goal: Adequate nutrition will be maintained Outcome: Adequate for Discharge   Problem: Coping: Goal: Level of anxiety will decrease Outcome: Adequate for Discharge   Problem: Elimination: Goal: Will not experience complications related to bowel motility Outcome: Adequate for Discharge Goal: Will not experience complications related to urinary retention Outcome: Adequate for Discharge   Problem: Pain Managment: Goal: General experience of comfort will improve Outcome: Adequate for Discharge   Problem: Safety: Goal: Ability to remain free from injury will improve Outcome: Adequate for Discharge   Problem: Skin Integrity: Goal: Risk for impaired skin integrity will decrease Outcome: Adequate for Discharge  Discharge instructions given to patient. Questions answered. Educated on new medication regimen, signs/symptoms, restrictions, and follow up appointments. [facility] [address] PIV DC, hemostasis achieved. Vital signs stable.  All belongings sent home with patient.   Pt escorted by NT via wheelchair to private vehicle driven by spouse.    DC delay d/t pt family requesting home equipment of a BSC and hospital bed. Placed order for CM, call and left voicemail with Neoma Laming CM and [employer].

## 2019-02-14 NOTE — Progress Notes (Signed)
Advanced Heart Failure Rounding Note  PCP-Cardiologist: [name], MD   Subjective:    CT out. Feels good. Had BM x2 yesterday.  No SOB. Wants to go home    Objective:   Weight Range: 48.6 kg Body mass index is 16.29 kg/m.   Vital Signs:   Temp:  [97.6 F (36.4 C)-98.2 F (36.8 C)] 98.1 F (36.7 C) (10/23 0700) Pulse Rate:  [78-91] 87 (10/23 0700) Resp:  [14-22] 15 (10/23 0700) BP: (118-154)/(70-90) 151/75 (10/23 0700) SpO2:  [96 %-99 %] 96 % (10/23 0916) Weight:  [48.6 kg] 48.6 kg (10/23 0632) Last BM Date: 02/13/19(Per pt)  Weight change: Filed Weights   02/12/19 0434 02/13/19 0256 02/14/19 8563  Weight: 60.1 kg 61 kg 48.6 kg    Intake/Output:   Intake/Output Summary (Last 24 hours) at 02/14/2019 1053 Last data filed at 02/14/2019 0900 Gross per 24 hour  Intake 651.1 ml  Output 90 ml  Net 561.1 ml      Physical Exam    PHYSICAL EXAM: General:  Thin frail appearing AAM w/ scleroderma. No respiratory difficulty HEENT: normal Neck: supple. no JVD. Carotids 2+ bilat; no bruits. No lymphadenopathy or thryomegaly appreciated. Cor: PMI nondisplaced. Regular rate & rhythm. No rubs, gallops or murmurs. Lungs: clear Abdomen: soft, nontender, nondistended. No hepatosplenomegaly. No bruits or masses. Good bowel sounds. Extremities: no cyanosis, clubbing, rash, edema diffuse scleroderma changes Neuro: alert & orientedx3, cranial nerves grossly intact. moves all 4 extremities w/o difficulty. Affect pleasant    Telemetry   NSR 80-90s Personally reviewed    EKG    N/A  Labs    CBC Recent Labs    02/12/19 0247 02/13/19 0239  WBC 13.9* 10.6*  HGB 8.5* 8.7*  HCT 25.4* 27.5*  MCV 88.8 91.1  PLT 215 149   Basic Metabolic Panel Recent Labs    02/12/19 0247 02/13/19 0239  NA 140 142  K 5.3* 4.9  CL 112* 114*  CO2 20* 19*  GLUCOSE 103* 73  BUN 84* 77*  CREATININE 2.77* 2.19*  CALCIUM 8.2* 8.4*   Liver Function Tests Recent Labs   02/12/19 0247  AST 40  ALT 19  ALKPHOS 76  BILITOT 0.4  PROT 5.8*  ALBUMIN 2.1*   No results for input(s): LIPASE, AMYLASE in the last 72 hours. Cardiac Enzymes No results for input(s): CKTOTAL, CKMB, CKMBINDEX, TROPONINI in the last 72 hours.  BNP: BNP (last 3 results) Recent Labs    08/13/18 1251 08/26/18 1037 02/03/19 1015  BNP 359.8* 342.8* 185.8*    ProBNP (last 3 results) No results for input(s): PROBNP in the last 8760 hours.   D-Dimer No results for input(s): DDIMER in the last 72 hours. Hemoglobin A1C No results for input(s): HGBA1C in the last 72 hours. Fasting Lipid Panel No results for input(s): CHOL, HDL, LDLCALC, TRIG, CHOLHDL, LDLDIRECT in the last 72 hours. Thyroid Function Tests No results for input(s): TSH, T4TOTAL, T3FREE, THYROIDAB in the last 72 hours.  Invalid input(s): FREET3  Other results:   Imaging    Dg Chest Port 1 View  Result Date: 02/14/2019 CLINICAL DATA:  58 year old male status post chest tube removal yesterday. EXAM: PORTABLE CHEST 1 VIEW COMPARISON:  Chest x-ray 02/13/2019. FINDINGS: Trace right apical pneumothorax noted on the prior study is no longer confidently identified. No acute consolidative airspace disease. Small right pleural effusion. No left pleural effusion. No evidence of pulmonary edema. Heart size is normal. Upper mediastinal contours are within normal limits. Aortic  atherosclerosis. IMPRESSION: 1. Previously noted trace right-sided pneumothorax has resolved. 2. Small right pleural effusion. 3. Aortic atherosclerosis. Electronically Signed   By: Vinnie Langton M.D.   On: 02/14/2019 08:03     Medications:     Scheduled Medications: . acetaminophen  1,000 mg Oral Q6H   Or  . acetaminophen (TYLENOL) oral liquid 160 mg/5 mL  1,000 mg Oral Q6H  . allopurinol  100 mg Oral Daily  . bisacodyl  10 mg Oral Daily  . enoxaparin (LOVENOX) injection  30 mg Subcutaneous Q24H  . feeding supplement (ENSURE ENLIVE)  237  mL Oral TID BM  . fluticasone  2 spray Each Nare Daily  . influenza vac split quadrivalent PF  0.5 mL Intramuscular Tomorrow-1000  . macitentan  10 mg Oral Daily  . nicotine  14 mg Transdermal Daily  . senna-docusate  1 tablet Oral QHS  . sildenafil  20 mg Oral TID  . simvastatin  40 mg Oral Daily    Infusions: . sodium chloride Stopped (02/13/19 1900)    PRN Medications: ALPRAZolam, diphenhydrAMINE **OR** diphenhydrAMINE, ipratropium-albuterol, ondansetron (ZOFRAN) IV, ondansetron (ZOFRAN) IV, oxyCODONE, traMADol    Patient Profile   [name] is seen today for management of RV failure and PAH during the post operative period (POD#3 right VATS for drainage of right pleural effusion and pericardial window), at the request of Dr. [name], [facility].   Assessment/Plan    1. Large recurrent pericardial effusionand moderate right pleural effusion - s/p window in 2010 with Dr. Nilda Simmer developed large recurrent pericardial effusion w/o tamponade and moderate right sided pleural effusion requiring repeat intervention - s/p right VATS drainage of his pleural effusion and pericardial window 02/10/19. -Fluid analysis c/w transudative effusion  - POD#4. CT out. Sounds good   3. 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 PA 81/26 (48), PCW 17, PVC 6.0 -Echo 02/03/19 showed moderately elevated right ventricular systolic pressure at 59.0 mmHg. - Continue sildenafil 20 mg TID - Continue opsumit 10 mg daily - Will need to consider repeat RHC in near future to add selexipag  4. 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 CAD - s/p previous RCA stent. - LHC 07/01/18: Non obstructive CAD with patent RCA stent. - Nos/s ischemia - Off ASA with GIB. Continue statin, simvastatin 40   5.  Scleroderma - Follows with Dr. Estanislado Pandy   6. Tobacco use -smoking 3/4 ppd. Encouraged cessation. No change.  7. 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 - Hgb stable 8.7  8. Acute on Chronic CKD: recent increase in SCr from 2.85>>3.22. Mostly likely due to aggressive diuresis over past 2 weeks.  -IVF bolus given post op and diuretics held -SCr improving, down 3.22>>2.77>>2.19 (close to baseline) -Volume looks ok. Continue to hold diuretics. F/u BMP in the AM    [facility] for d/c from our standpoint. Would send home on torsemide 20 daily + home PAH meds. I will see in f/u   Length of Stay: King Salmon, MD  02/14/2019, 10:53 AM  Advanced Heart Failure Team Pager [phone] (M-F; 7a - 4p)  Please contact [facility] for night-coverage after hours (4p -7a ) and weekends on [email or web address]

## 2019-02-14 NOTE — Progress Notes (Addendum)
[address]       [place] [address]             [phone]      4 Days Post-Op Procedure(s) (LRB): VIDEO ASSISTED THORACOSCOPY (Right) DRAINAGE OF PLEURAL EFFUSION (Right) PERICARDIAL WINDOW (N/A) Subjective: Feels okay this morning. Wants to go home.  Objective: Vital signs in last 24 hours: Temp:  [97.6 F (36.4 C)-98.2 F (36.8 C)] 98.2 F (36.8 C) (10/23 0348) Pulse Rate:  [78-91] 81 (10/23 0348) Cardiac Rhythm: Normal sinus rhythm (10/23 0700) Resp:  [14-22] 22 (10/23 0348) BP: (118-154)/(70-90) 126/70 (10/23 0348) SpO2:  [96 %-99 %] 97 % (10/23 0348) Weight:  [48.6 kg] 48.6 kg (10/23 4276)     Intake/Output from previous day: 10/22 0701 - 10/23 0700 In: 531.1 [P.O.:220; I.V.:311.1] Out: 775 [Urine:625; Chest Tube:150] Intake/Output this shift: No intake/output data recorded.  General appearance: alert, cooperative and no distress Heart: regular rate and rhythm, S1, S2 normal, no murmur, click, rub or gallop Lungs: clear to auscultation bilaterally Abdomen: soft, non-tender; bowel sounds normal; no masses,  no organomegaly Extremities: extremities normal, atraumatic, no cyanosis or edema Wound: clean and dry  Lab Results: Recent Labs    02/12/19 0247 02/13/19 0239  WBC 13.9* 10.6*  HGB 8.5* 8.7*  HCT 25.4* 27.5*  PLT 215 240   BMET:  Recent Labs    02/12/19 0247 02/13/19 0239  NA 140 142  K 5.3* 4.9  CL 112* 114*  CO2 20* 19*  GLUCOSE 103* 73  BUN 84* 77*  CREATININE 2.77* 2.19*  CALCIUM 8.2* 8.4*    PT/INR: No results for input(s): LABPROT, INR in the last 72 hours. ABG    Component Value Date/Time   PHART 7.431 02/11/2019 0341   HCO3 21.1 02/11/2019 0341   TCO2 21 (L) 02/10/2019 1211   ACIDBASEDEF 2.7 (H) 02/11/2019 0341   O2SAT 96.4 02/11/2019 0341   CBG (last 3)  No results for input(s): GLUCAP in the last 72 hours.  Assessment/Plan: S/P Procedure(s) (LRB): VIDEO ASSISTED THORACOSCOPY (Right)  DRAINAGE OF PLEURAL EFFUSION (Right) PERICARDIAL WINDOW (N/A)  1. CV-NSR in the 80s. BP well controlled. 2. Pulm-tolerating room air with good oxygen saturation. Chest tube removed yesterday. CXR stable. 3. Renal-creatinine 2.19, electrolytes okay. Stage III CKD.  4. H and H 8.7/27.5, expected acute blood loss anemia. 5. Endo-well controlled 6. HF-appreciate cardiology management  Plan: Home today. Instructions provided to the patient and questions answered.   LOS: 4 days    Elgie Collard 02/14/2019 Patient seen and examined, agree with above Home later today if [facility] with AHF team  Revonda Standard. [name], MD [facility] [phone]

## 2019-02-18 ENCOUNTER — Ambulatory Visit: Payer: 59 | Admitting: Cardiovascular Disease

## 2019-02-24 ENCOUNTER — Other Ambulatory Visit: Payer: Self-pay | Admitting: Thoracic Surgery (Cardiothoracic Vascular Surgery)

## 2019-02-24 DIAGNOSIS — I3139 Other pericardial effusion (noninflammatory): Secondary | ICD-10-CM

## 2019-02-24 DIAGNOSIS — I313 Pericardial effusion (noninflammatory): Secondary | ICD-10-CM

## 2019-02-25 ENCOUNTER — Other Ambulatory Visit: Payer: Self-pay

## 2019-02-25 ENCOUNTER — Encounter: Payer: Self-pay | Admitting: Thoracic Surgery (Cardiothoracic Vascular Surgery)

## 2019-02-25 ENCOUNTER — Ambulatory Visit
Admission: RE | Admit: 2019-02-25 | Discharge: 2019-02-25 | Disposition: A | Discharge status: Home / Self Care | Payer: 59 | Source: Ambulatory Visit | Attending: Thoracic Surgery (Cardiothoracic Vascular Surgery) | Admitting: Thoracic Surgery (Cardiothoracic Vascular Surgery)

## 2019-02-25 ENCOUNTER — Ambulatory Visit (INDEPENDENT_AMBULATORY_CARE_PROVIDER_SITE_OTHER): Payer: Self-pay | Admitting: Thoracic Surgery (Cardiothoracic Vascular Surgery)

## 2019-02-25 VITALS — BP 114/76 | HR 91 | Temp 97.7°F | Resp 16 | Ht 68.0 in | Wt 106.6 lb

## 2019-02-25 DIAGNOSIS — I313 Pericardial effusion (noninflammatory): Secondary | ICD-10-CM

## 2019-02-25 DIAGNOSIS — I3139 Other pericardial effusion (noninflammatory): Secondary | ICD-10-CM

## 2019-02-25 DIAGNOSIS — Z09 Encounter for follow-up examination after completed treatment for conditions other than malignant neoplasm: Secondary | ICD-10-CM

## 2019-02-25 DIAGNOSIS — J90 Pleural effusion, not elsewhere classified: Secondary | ICD-10-CM

## 2019-02-25 IMAGING — CR DG CHEST 2V
2 series · 2 of 2 positions shown · non-contrast
Comparison: [DATE]

CLINICAL DATA: Pericardial effusion, pericardial window [DATE],
right pleural effusion

EXAM:
CHEST - 2 VIEW

[w chest pa]
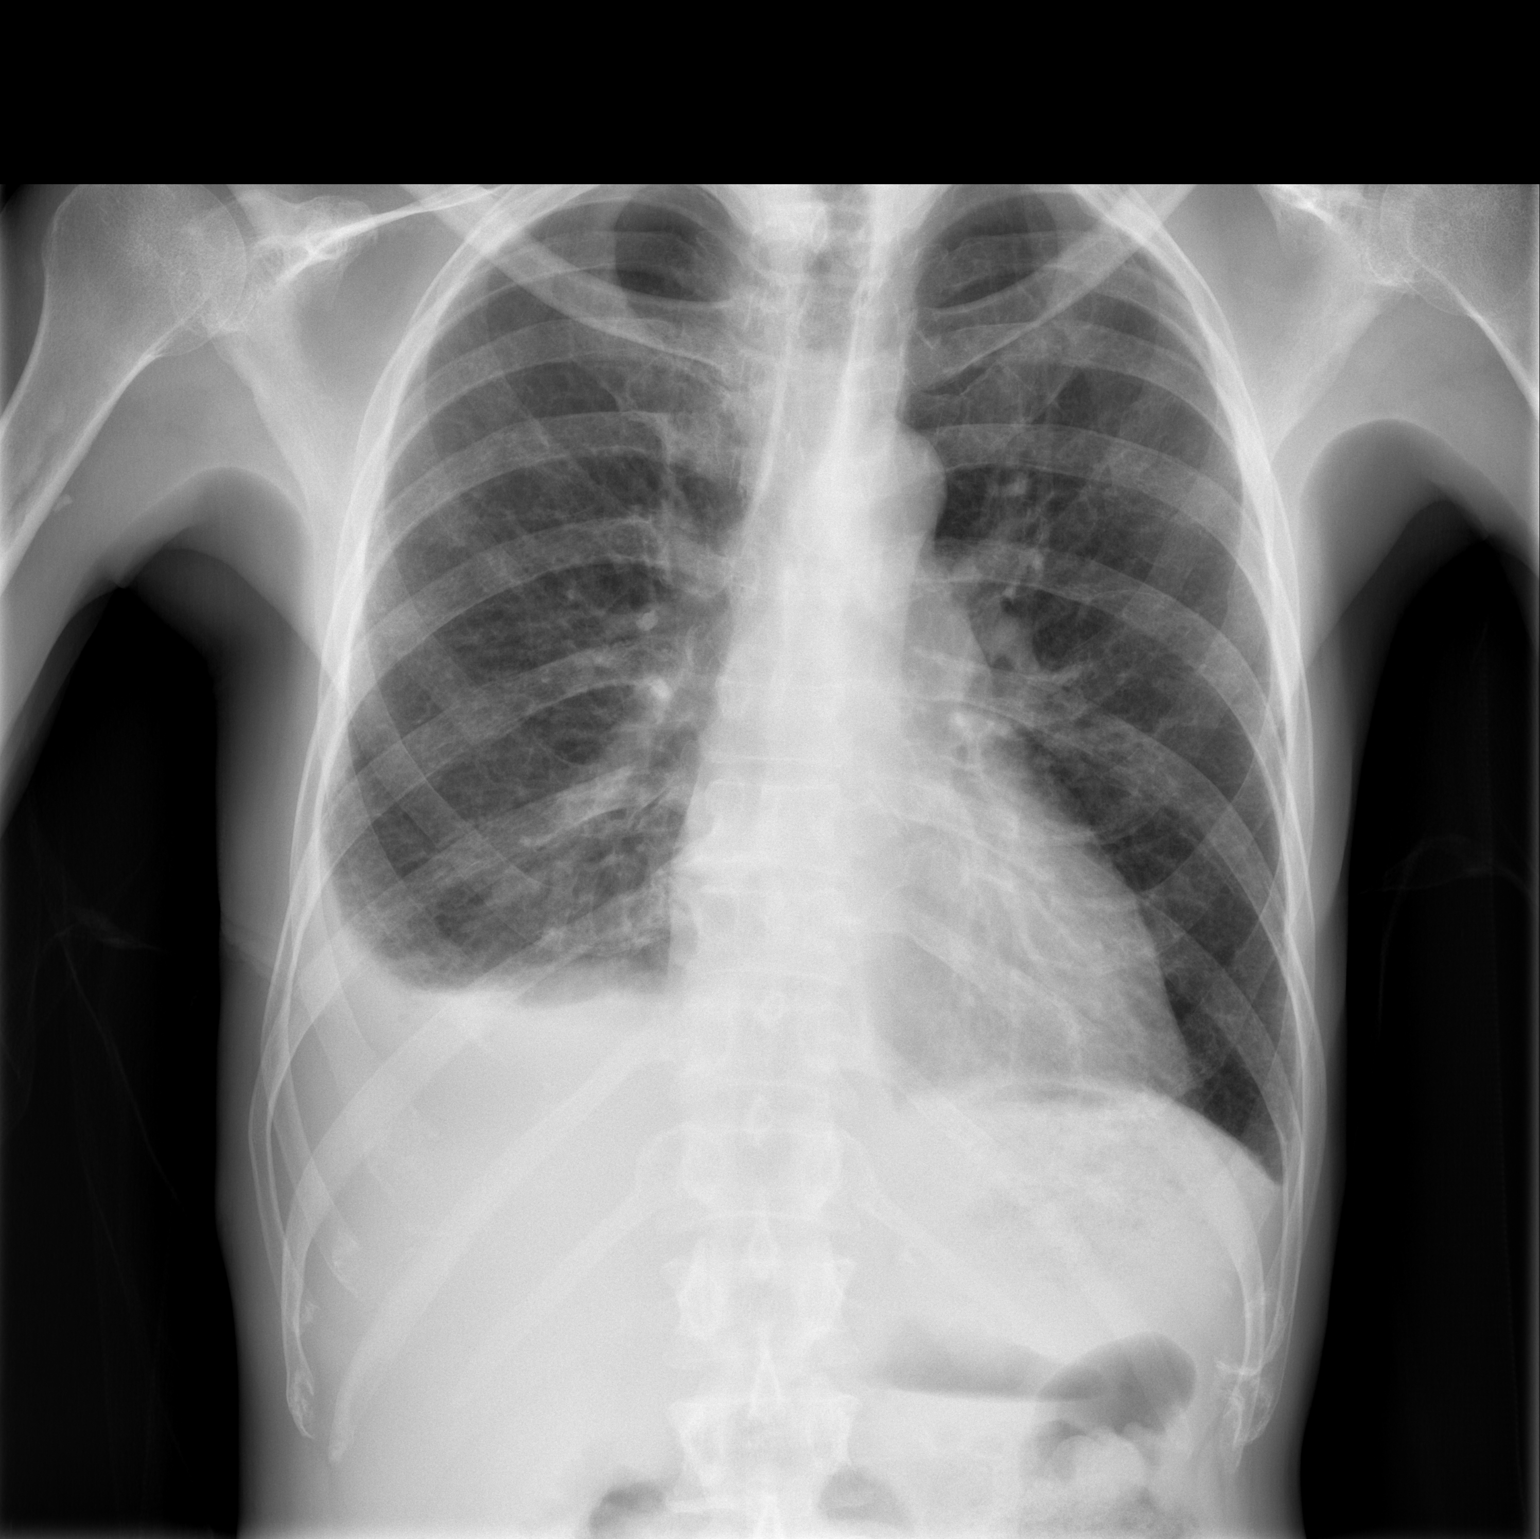

[w chest lat]
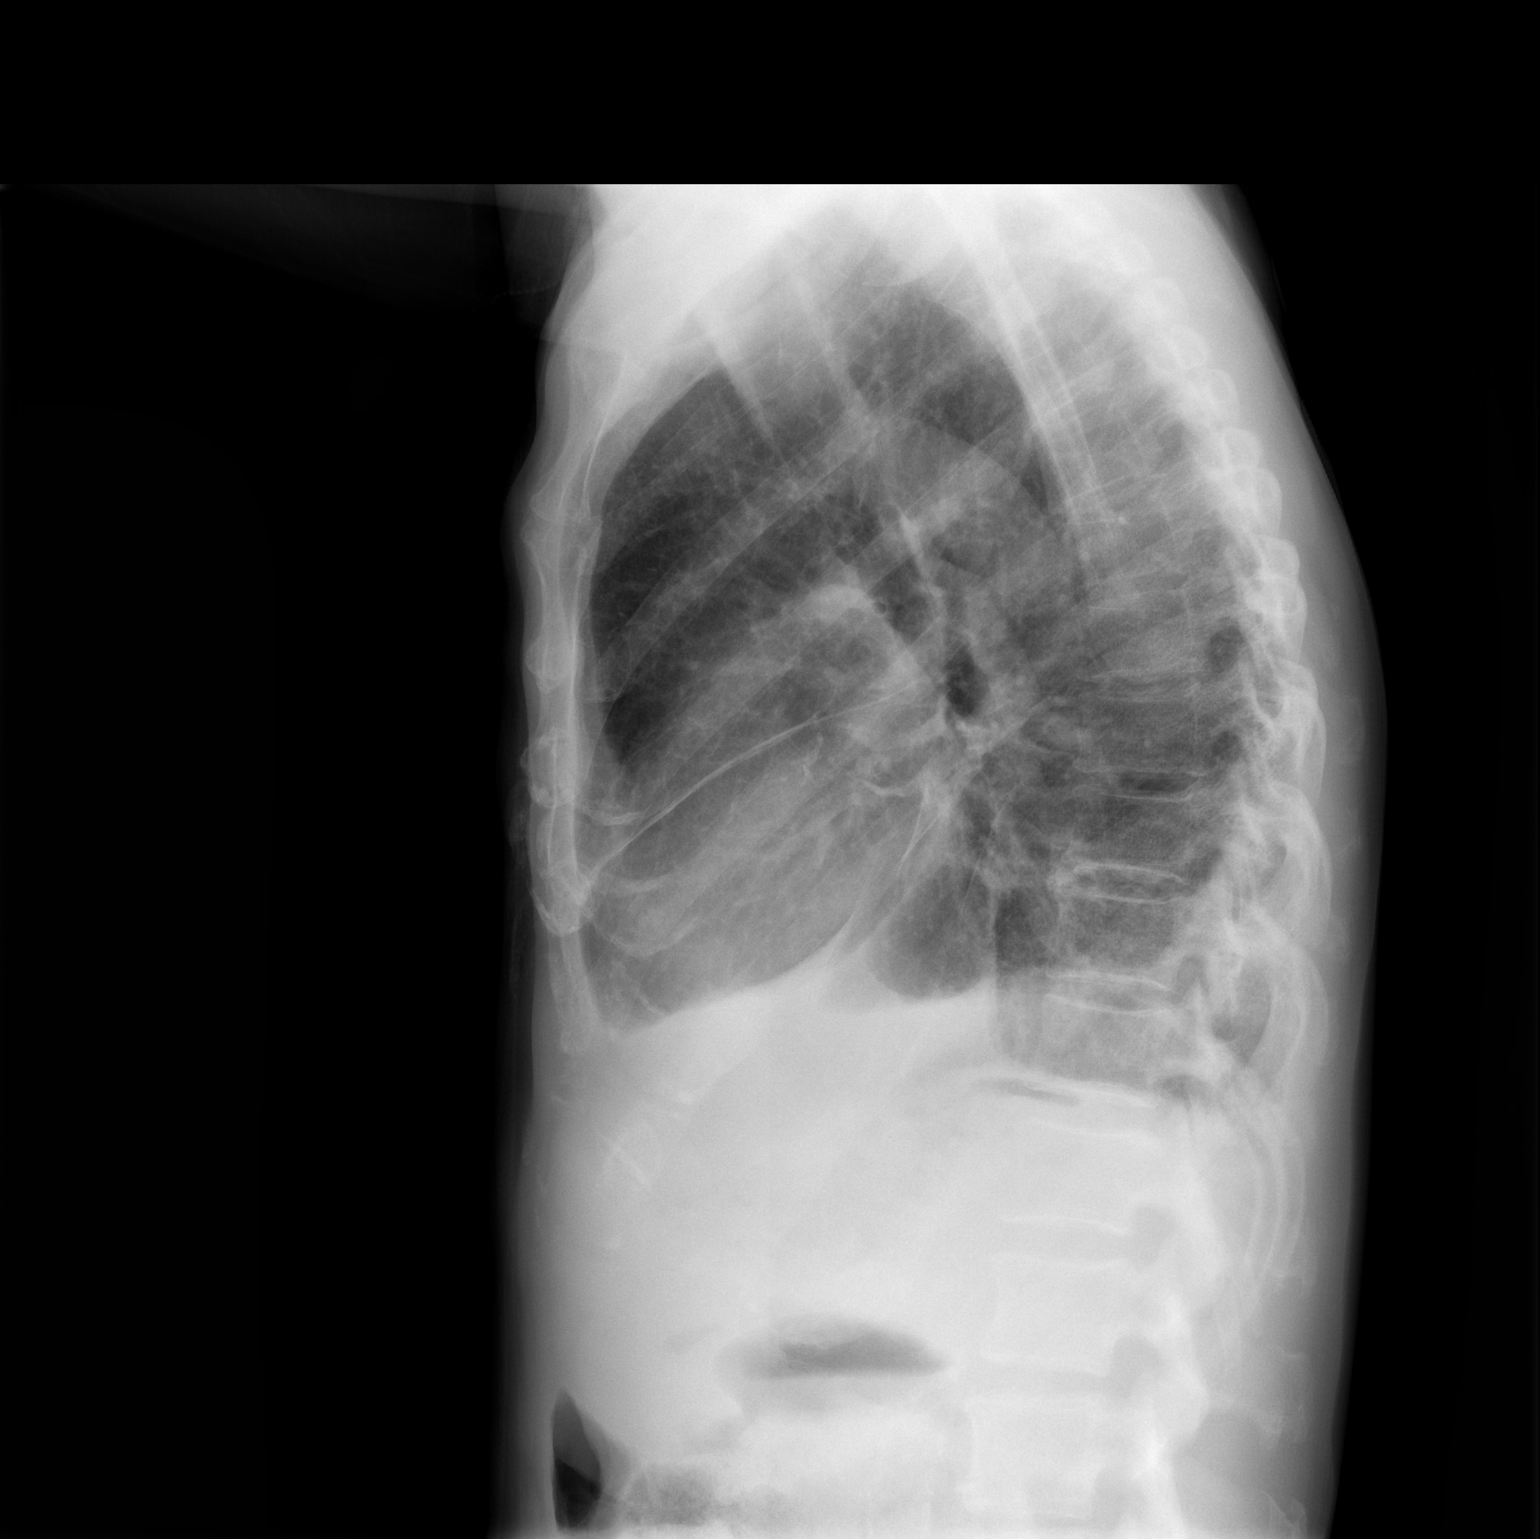

[2 of 2 positions shown; findings below may reference images not displayed]

FINDINGS: The cardiomediastinal silhouette is unchanged. There is a small to
moderate right pleural effusion. Mildly increased interstitial
markings are seen the right lung base. The left lung is clear. No
pneumothorax.
IMPRESSION: Small to moderate right pleural effusion with adjacent layering
pleural effusion/atelectasis at the right lung base.

## 2019-02-25 MED ORDER — PREDNISONE 10 MG (21) PO TBPK: ORAL_TABLET | ORAL | 0 refills | Status: AC

## 2019-02-25 NOTE — Progress Notes (Signed)
[address]       [place] [address]             [phone]    HPI: [name] returns for a scheduled postoperative follow-up visit  Kamran Coker is a 58 year old man with a past history of tobacco abuse, hypertension, pulmonary hypertension, hyperlipidemia, coronary disease, stage III chronic kidney disease, scleroderma, gout, Raynaud's, and pericarditis.  He had a pericardial window in 2010.  In March 2020 he started noting worsening swelling in his legs and shortness of breath.  Echocardiogram showed an ejection fraction of 45 to 50%.  He did have a large pericardial effusion and elevated pulmonary arterial pressures.  He was treated with aggressive diuretics which helped his edema but did adversely affect his renal function.  Repeat echo showed a persistent large pericardial effusion and chest x-ray showed a right pleural effusion.  He was referred for a repeat pericardial window.  I did a right VATS for drainage of a pleural effusion and pericardial window on 02/10/2019.  Pathology on the pericardium showed some inflammation.  The fluid was serous in nature but did have a slightly elevated LDH.  His diuretic regimen was adjusted by Dr. Haroldine Laws while he was in the hospital.  His postoperative course was uncomplicated and he went home on 02/14/2019.  Since discharge he has been feeling well.  He has not had any worsening of his respiratory status.  He is not having to take any narcotics for pain.  He has not had swelling in his legs.  Past Medical History:  Diagnosis Date  . Anxiety   . CAD (coronary artery disease)   . Gout   . Hyperlipidemia   . Hypertension 05/14/2012    Lexiscan-- EF 51% ,LV normal  . Hypothyroid   . MI (myocardial infarction) ([place]) 2010  . Pericardial effusion 12/2008   Dr [name] performed a subxiphoid window removing 242m of fluid  . Pericardial effusion 03/09/2010   Echo-LVEF >55%, very small pericardial effusion ,,Stage 1  (impaired ) diastolic fxn, elevated LV filling  . Pericarditis   . Raynaud's phenomenon   [place]   . Smoker 09/16/2018   1 ppd  . Vitiligo     Current Outpatient Medications  Medication Sig Dispense Refill  . acetaminophen (TYLENOL) 500 MG tablet Take 2 tablets (1,000 mg total) by mouth every 6 (six) hours. 30 tablet 0  . allopurinol (ZYLOPRIM) 100 MG tablet TAKE 1 TABLET BY MOUTH EVERY DAY (Patient taking differently: Take 100 mg by mouth daily. ) 30 tablet 2  . ALPRAZolam (XANAX) 0.5 MG tablet Take 0.5 mg by mouth daily as needed for anxiety.     . fluticasone (FLONASE) 50 MCG/ACT nasal spray Place 2 sprays into both nostrils daily.   11  . macitentan (OPSUMIT) 10 MG tablet Take 1 tablet (10 mg total) by mouth daily. 90 tablet 3  . Multiple Vitamins-Minerals (MULTIVITAMIN WITH MINERALS) tablet Take 1 tablet by mouth 3 (three) times a week.     . nicotine (NICODERM CQ - DOSED IN MG/24 HOURS) 14 mg/24hr patch Place 1 patch (14 mg total) onto the skin daily. 28 patch 0  . oxyCODONE (OXY IR/ROXICODONE) 5 MG immediate release tablet Take 1 tablet (5 mg total) by mouth every 6 (six) hours as needed for moderate pain. 30 tablet 0  . potassium chloride SA (KLOR-CON) 20 MEQ tablet Take 1 tablet (20 mEq total) by mouth daily. 60 tablet 3  .  sildenafil (REVATIO) 20 MG tablet Take 1 tablet (20 mg total) by mouth 3 (three) times daily. 90 tablet 12  . simvastatin (ZOCOR) 40 MG tablet Take 1 tablet (40 mg total) by mouth daily. Daily in the morning 30 tablet 11  . torsemide (DEMADEX) 20 MG tablet Take 1 tablet (20 mg total) by mouth daily. 60 tablet 6  . predniSONE (STERAPRED UNI-PAK 21 TAB) 10 MG (21) TBPK tablet Take 6 tablets (60 mg total) by mouth daily for 1 day, THEN 5 tablets (50 mg total) daily for 1 day, THEN 4 tablets (40 mg total) daily for 1 day, THEN 3 tablets (30 mg total) daily for 1 day, THEN 2 tablets (20 mg total) daily for 1 day, THEN 1 tablet (10 mg total) daily for 1 day.  21 tablet 0   No current facility-administered medications for this visit.     Physical Exam BP 114/76 (BP Location: Left Arm, Patient Position: Sitting, Cuff Size: Normal)   Pulse 91   Temp 97.7 F (36.5 C)   Resp 16   Ht _0  (1.727 m)   Wt 106 lb 9.6 oz (48.4 kg)   BMI 16.43 kg/m  58 year old man in no acute distress Cachectic Alert and oriented x3 with no focal deficits Cardiac regular rate and rhythm Lungs slightly diminished at right base, otherwise clear Incisions healing well No peripheral edema  Diagnostic Tests: CHEST - 2 VIEW  COMPARISON:  February 14, 2019  FINDINGS: The cardiomediastinal silhouette is unchanged. There is a small to moderate right pleural effusion. Mildly increased interstitial markings are seen the right lung base. The left lung is clear. No pneumothorax.  IMPRESSION: Small to moderate right pleural effusion with adjacent layering pleural effusion/atelectasis at the right lung base.   Electronically Signed   By: Prudencio Pair M.D.   On: 02/25/2019 12:18 I personally reviewed the chest x-ray images and concur with the findings noted above  Impression: Ysabel Cowgill is a 58 year old man with a history of tobacco abuse, hypertension, pulmonary hypertension, hypothyroidism, hyperlipidemia, CAD, MI, stage III chronic kidney disease, scleroderma, gout, Raynaud's, and pericarditis.  I did a right VATS to drain a pleural effusion and do a pericardial window on 02/10/2019.  His postoperative course was uncomplicated and he went home on day 4.  His chest x-ray today's does show a small right pleural effusion.  Is about where it was prior to surgery.  He does not have any edema and I think is fluid balance is about euvolemic.  He did have some inflammation on the pericardial tissue, so he might benefit from a steroid taper.  He is taken prednisone before without any significant issues.  I am going to give him a taper from 60 mg to 10 mg over 6  days.  There are no restrictions on his activities.  He will follow-up with Dr. Haroldine Laws in mid December.  Plan: Prednisone taper Follow-up with Dr. Haroldine Laws I will be happy to see Mr. Holquin back anytime in the future if I can be of any further assistance in his care  Melrose Nakayama, MD [facility] [phone]

## 2019-03-03 ENCOUNTER — Ambulatory Visit: Payer: 59 | Admitting: Gastroenterology

## 2019-03-03 ENCOUNTER — Encounter: Payer: Self-pay | Admitting: Gastroenterology

## 2019-03-03 ENCOUNTER — Other Ambulatory Visit: Payer: Self-pay

## 2019-03-03 VITALS — BP 116/66 | HR 82 | Temp 98.0°F | Ht 68.5 in | Wt 109.2 lb

## 2019-03-03 DIAGNOSIS — K5520 Angiodysplasia of colon without hemorrhage: Secondary | ICD-10-CM | POA: Diagnosis not present

## 2019-03-03 DIAGNOSIS — D126 Benign neoplasm of colon, unspecified: Secondary | ICD-10-CM

## 2019-03-03 DIAGNOSIS — K2970 Gastritis, unspecified, without bleeding: Secondary | ICD-10-CM

## 2019-03-03 DIAGNOSIS — I509 Heart failure, unspecified: Secondary | ICD-10-CM

## 2019-03-03 DIAGNOSIS — I519 Heart disease, unspecified: Secondary | ICD-10-CM

## 2019-03-03 DIAGNOSIS — K219 Gastro-esophageal reflux disease without esophagitis: Secondary | ICD-10-CM | POA: Diagnosis not present

## 2019-03-03 DIAGNOSIS — K5521 Angiodysplasia of colon with hemorrhage: Secondary | ICD-10-CM

## 2019-03-03 DIAGNOSIS — K2990 Gastroduodenitis, unspecified, without bleeding: Secondary | ICD-10-CM

## 2019-03-03 MED ORDER — PANTOPRAZOLE SODIUM 40 MG PO TBEC: 40.0000 mg | DELAYED_RELEASE_TABLET | Freq: Every day | ORAL | 5 refills | Status: DC

## 2019-03-03 NOTE — Patient Instructions (Signed)
We have sent the following medications to your pharmacy for you to pick up at your convenience: Protonix  Normal BMI (Body Mass Index- based on height and weight) is between 19 and 25. Your BMI today is Body mass index is 16.37 kg/m. [name] Please consider follow up  regarding your BMI with your Primary Care Provider.  Follow up in 6 months  It was a pleasure to see you today!  Vito Cirigliano, D.O.

## 2019-03-03 NOTE — Progress Notes (Signed)
P  Chief Complaint:    GERD, heartburn  GI History: [name] is a 58 year old man with a history of CAD, CHF, pulmonary hypertension,pericardial effusions/ppericardial window in 2010 and again 01/2019, CKD, Scleroderma, Raynaud's, hyperlipidemia, hypertension, prior MIadmitted 5/6-9 with duodenal and colonic AVMs, treated with APCand 2 units PRBCs.There was a significant burden of AVMs in the cecum, to include active oozing. Bleeding all treated with APC and discharged home without any antiplatelet therapy. No anticoagulation. Was unfortunately readmitted 5/23 for hemodynamically significant bleeding, requiring IR coil embolization of the cecal artery on 5/23 and 1 unit PRBCs.  - 10/31/2018: AST/ALT/ALP/T bili: 321/316/534/0 0.8 -11/15/2018: AST/ALT/ALP/T bili: 16/13/174/0 0.4 - ANA+ at 1:1280 and a nuclear, speckled pattern (has history of scleroderma and Raynaud's though) - APAP, acute hepatitis panel negative/normal -IgA mildly elevated at 316 (ULN 310) - Normal IgG, IgM, ceruloplasmin, ferritin, ASMA, LKM, AMA -Refused to go for COVID testing - H/H at baseline (9.2/28) -RUQ [place] with Doppler: Normal, bilateral pleural effusions  Endoscopic history: -EGD (08/30/2018, Dr. Bryan Lemma): 4 duodenal AVMs treated with APC,polypoid cystic type lesion in third portion of the duodenum (biopsy benign), nodular area in the second portion of the duodenum (biopsybenign), benign lymphangiectasia in the small bowel, and diffuse, moderate gastritis (biopsywith benign fundic gland polyps, no H. pylori). Recommended repeat upper endoscopy in 8 weeks. -Colonoscopy (08/30/2018, Dr. Risha Barretta):multiple bleeding AVMs in the cecum, treated with APC, with additional AVMs in the ascending, descending, sigmoid colon, all treated with APC. 12 mm tubular adenoma resected from transverse colon. Prep was poor, with recommendation repeat in 6 months - VCE (09/2018, Dr. Catina Nuss):AVMs noted in the  proximal small bowel, starting at 00:27:42, and mid small intestine, located at 02:10, 02:32, 02:57 and 04:03. Capsule did not transit into the cecum before end of study.  HPI:     Patient is a 58 y.o. male presenting [facility] for follow-up. Last seen by me in 11/2018 for follow-up of elevated liver enzymes.  Extensive work-up mostly unrevealing aside from elevated ANA (does have a history of scleroderma), and enzymes were rapidly down trended/normalized within 2 weeks.  At the time of his last appointment, he had declined push enteroscopy, single balloon enteroscopy for treatment of AVMs.  Recommended colonoscopy for routine polyp surveillance along with treatment of any remaining colonic AVMs, but he has not yet scheduled this, mostly stemming from not wanting to be tested for Covid.  No longer taking antiplatelet therapy or anticoagulation.   Was recently admitted in 01/2019 with persistent large pericardial effusion and right pleural effusion, requiring repeat pericardial window and right VATS on 10/19, diuretic adjustment, prednisone (now tapering), and discharged 02/14/2019.  Hemoglobin 8.7 at discharge, stable from prior.    Today, his only complaint is of acid reflux and hiccups. +HB, regurgitation.  Symptoms started shortly after recent surgery.  His Protonix was stopped for some unknown period of time before that admission. Worse after eating and supine.  No dysphagia.  Tolerating p.o. intake otherwise without issue.   Review of systems:     No chest pain, no SOB, no fevers, no urinary sx   Past Medical History:  Diagnosis Date  . Anxiety   . CAD (coronary artery disease)   . Gout   . Hyperlipidemia   . Hypertension 05/14/2012    Lexiscan-- EF 51% ,LV normal  . Hypothyroid   . MI (myocardial infarction) ([place]) 2010  . Pericardial effusion 12/2008   Dr [name] performed a subxiphoid window removing  239m of fluid  . Pericardial effusion 03/09/2010    Echo-LVEF >55%, very small pericardial effusion ,,Stage 1 (impaired ) diastolic fxn, elevated LV filling  . Pericarditis   . Raynaud's phenomenon   . [place]   . Smoker 09/16/2018   1 ppd  . Vitiligo     Patient's surgical history, family medical history, social history, medications and allergies were all reviewed in Epic    Current Outpatient Medications  Medication Sig Dispense Refill  . acetaminophen (TYLENOL) 500 MG tablet Take 2 tablets (1,000 mg total) by mouth every 6 (six) hours. 30 tablet 0  . allopurinol (ZYLOPRIM) 100 MG tablet TAKE 1 TABLET BY MOUTH EVERY DAY (Patient taking differently: Take 100 mg by mouth daily. ) 30 tablet 2  . ALPRAZolam (XANAX) 0.5 MG tablet Take 0.5 mg by mouth daily as needed for anxiety.     . fluticasone (FLONASE) 50 MCG/ACT nasal spray Place 2 sprays into both nostrils daily.   11  . macitentan (OPSUMIT) 10 MG tablet Take 1 tablet (10 mg total) by mouth daily. 90 tablet 3  . Multiple Vitamins-Minerals (MULTIVITAMIN WITH MINERALS) tablet Take 1 tablet by mouth 3 (three) times a week.     . nicotine (NICODERM CQ - DOSED IN MG/24 HOURS) 14 mg/24hr patch Place 1 patch (14 mg total) onto the skin daily. 28 patch 0  . oxyCODONE (OXY IR/ROXICODONE) 5 MG immediate release tablet Take 1 tablet (5 mg total) by mouth every 6 (six) hours as needed for moderate pain. 30 tablet 0  . potassium chloride SA (KLOR-CON) 20 MEQ tablet Take 1 tablet (20 mEq total) by mouth daily. 60 tablet 3  . predniSONE (STERAPRED UNI-PAK 21 TAB) 10 MG (21) TBPK tablet Take 6 tablets (60 mg total) by mouth daily for 1 day, THEN 5 tablets (50 mg total) daily for 1 day, THEN 4 tablets (40 mg total) daily for 1 day, THEN 3 tablets (30 mg total) daily for 1 day, THEN 2 tablets (20 mg total) daily for 1 day, THEN 1 tablet (10 mg total) daily for 1 day. 21 tablet 0  . sildenafil (REVATIO) 20 MG tablet Take 1 tablet (20 mg total) by mouth 3 (three) times daily. 90 tablet 12  .  simvastatin (ZOCOR) 40 MG tablet Take 1 tablet (40 mg total) by mouth daily. Daily in the morning 30 tablet 11  . torsemide (DEMADEX) 20 MG tablet Take 1 tablet (20 mg total) by mouth daily. 60 tablet 6   No current facility-administered medications for this visit.     Physical Exam:     BP 116/66   Pulse 82   Temp 98 F (36.7 C)   Ht 5' 8.5" (1.74 m)   Wt 109 lb 4 oz (49.6 kg)   BMI 16.37 kg/m   GENERAL:  Pleasant male in NAD PSYCH: : Cooperative, normal affect EENT:  conjunctiva pink, mucous membranes moist, neck supple without masses CARDIAC:  RRR, no murmur heard, no peripheral edema PULM: Normal respiratory effort, lungs CTA bilaterally, no wheezing ABDOMEN:  Nondistended, soft, nontender. No obvious masses, no hepatomegaly,  normal bowel sounds SKIN:  turgor, no lesions seen Musculoskeletal:  Normal muscle tone, normal strength NEURO: Alert and oriented x 3, no focal neurologic deficits   IMPRESSION and PLAN:    1) GERD 2) Heartburn -Restart Protonix 40 mg/day -Discussed antireflux lifestyle/dietary modifications -Suspect hiccups are related to recent cardiothoracic surgery, but potentially exacerbated by reflux.  Observe for clinical improvement with time  and PPI  3) History of AVMs -No recent overt GI blood loss -Given recent cardiopulmonary surgery, stable hemoglobin at discharge, no e/o bleeding, no plan for endoscopic evaluation at this time -Previously had a long discussion with the patient and his wife regarding pathophysiology and natural course of AVMs, with discussion of medical management, which they declined owing to ADR profile, which is a reasonable decision -No longer taking any antiplatelet therapy. No anticoagulants. Does not take any NSAIDs -Repeat CBC in 3 months or sooner if symptoms  4) History of tubular adenomas: -He is due for repeat colonoscopy for polyp surveillance in prior suboptimal bowel prep.  However, in light of recent  cardiopulmonary surgery, he and his wife would reasonably like to postpone any elective type endoscopic evaluation for at least the next 6 months.       5) Non-H. pylori gastritis: -Resuming PPI for reflux as above, but would also need ongoing prophylaxis in the setting of prednisone  6) Constipation: -Well-controlled  7) CAD 8) CHF -Any repeat endoscopies should be performed at [facility] given comorbidities and high likelihood for APC  9) Elevated liver enzymes-resolved  RTC in  6 months or sooner as needed  I spent a total of 25 minutes of face-to-face time with the patient and his wife. Greater than 50% of the time was spent counseling and coordinating care.     RTC in 6 months or sooner as needed      Lavena Bullion ,DO, FACG 03/03/2019, 10:28 AM

## 2019-03-08 ENCOUNTER — Other Ambulatory Visit: Payer: Self-pay | Admitting: Physician Assistant

## 2019-04-01 ENCOUNTER — Telehealth: Payer: Self-pay | Admitting: Rheumatology

## 2019-04-01 NOTE — Telephone Encounter (Signed)
Patient's wife dropped off a form for [employer] to be completed, and $25.00 in cash. Form, and monies forwarded to [employer] attn Vern thru [employer].

## 2019-04-02 NOTE — Progress Notes (Signed)
Virtual Visit via Video Note  I connected with [name] on 04/04/19 at 10:30 AM EST by a video enabled telemedicine application and verified that I am speaking with the correct person using two identifiers.  Location: Patient: Home  Provider: Clinic  This service was conducted via virtual visit.  Both audio and visual tools were used.  The patient was located at home. I was located in my office.  Consent was obtained prior to the virtual visit and is aware of possible charges through their insurance for this visit.  The patient is an established patient.  Dr. Estanislado Pandy, MD conducted the virtual visit and Hazel Sams, PA-C acted as scribe during the service.  Office staff helped with scheduling follow up visits after the service was conducted.     I discussed the limitations of evaluation and management by telemedicine and the availability of in person appointments. The patient expressed understanding and agreed to proceed.  CC: History of Present Illness: Patient is a 58 year old male with a past medical history of  He denies any symptoms of raynaud's.  He is not having any digital ulcerations.  He quit smoking over 1 month ago.    Review of Systems  Constitutional: Negative for fever and malaise/fatigue.  Eyes: Negative for photophobia, pain, discharge and redness.  Respiratory: Negative for cough, shortness of breath and wheezing.   Cardiovascular: Negative for chest pain and palpitations.  Gastrointestinal: Negative for blood in stool, constipation and diarrhea.  Genitourinary: Negative for dysuria.  Musculoskeletal: Negative for back pain, joint pain, myalgias and neck pain.  Skin: Negative for rash.  Neurological: Negative for dizziness and headaches.  Psychiatric/Behavioral: Negative for depression. The patient is not nervous/anxious and does not have insomnia.       Observations/Objective: Physical Exam  Constitutional: He is oriented to person, place, and time and  well-developed, well-nourished, and in no distress.  HENT:  Head: Normocephalic and atraumatic.  Eyes: Conjunctivae are normal.  Pulmonary/Chest: Effort normal.  Neurological: He is alert and oriented to person, place, and time.  Psychiatric: Mood, memory, affect and judgment normal.   Patient reports morning stiffness for 10 minutes.   Patient denies nocturnal pain.  Difficulty dressing/grooming: Denies Difficulty climbing stairs: Denies Difficulty getting out of chair: Denies Difficulty using hands for taps, buttons, cutlery, and/or writing: Denies   Assessment and Plan:  Visit Diagnoses: Scleroderma ([place]) -He has severe sclerodactyly and a scleroderma.  He has not had any symptoms of Raynaud's recently.  No digital ulcerations or signs of gangrene.    Raynaud's syndrome without gangrene - His symptoms have improved on Revatio.  Idiopathic chronic gout of multiple sites without tophus - He has not had any recent gout flares.  He is taking allopurinol and colchicine as prescribed.  He is due to update uric acid level.  Patient states that he will get uric acid level with his next labs.  Elevated LFTs - He is seeing gastroenterologist.  Pericardial effusion - Recurrent-Related to [facility] and high coronary sinus pressures.   Pleural effusion-Right VATS for drainage of right pleural effusion and pericardial window on 02/10/19 performed by Dr. [name]. Fluid was serous with slightly elevated LDH.   Pulmonary hypertension ([place]) -Secondary to scleroderma   Right ventricular dysfunction-Due to cor pulmonale   History of chronic kidney disease - Followed by Dr. Joelyn Oms. Dr. Joelyn Oms contributes his renal disease to severe atherosclerosis and severe IF/TA   History of coronary artery disease   CAD S/P percutaneous coronary  angioplasty  Vitamin D deficiency  History of asthma   Asymptomatic microscopic hematuria   Asymptomatic proteinuria   History of pericarditis    History of hypertension  Vitiligo   Smoker   Follow Up Instructions: He will follow up in    I discussed the assessment and treatment plan with the patient. The patient was provided an opportunity to ask questions and all were answered. The patient agreed with the plan and demonstrated an understanding of the instructions.   The patient was advised to call back or seek an in-person evaluation if the symptoms worsen or if the condition fails to improve as anticipated.  I provided 25 minutes of non-face-to-face time during this encounter.   Bo Merino, MD  Scribed by-  Hazel Sams, PA-C

## 2019-04-04 ENCOUNTER — Other Ambulatory Visit: Payer: Self-pay

## 2019-04-04 ENCOUNTER — Encounter: Payer: Self-pay | Admitting: Rheumatology

## 2019-04-04 ENCOUNTER — Telehealth (INDEPENDENT_AMBULATORY_CARE_PROVIDER_SITE_OTHER): Payer: 59 | Admitting: Rheumatology

## 2019-04-04 DIAGNOSIS — I519 Heart disease, unspecified: Secondary | ICD-10-CM

## 2019-04-04 DIAGNOSIS — Z8679 Personal history of other diseases of the circulatory system: Secondary | ICD-10-CM

## 2019-04-04 DIAGNOSIS — I7300 Raynaud's syndrome without gangrene: Secondary | ICD-10-CM

## 2019-04-04 DIAGNOSIS — M349 Systemic sclerosis, unspecified: Secondary | ICD-10-CM

## 2019-04-04 DIAGNOSIS — R7989 Other specified abnormal findings of blood chemistry: Secondary | ICD-10-CM

## 2019-04-04 DIAGNOSIS — I313 Pericardial effusion (noninflammatory): Secondary | ICD-10-CM

## 2019-04-04 DIAGNOSIS — I272 Pulmonary hypertension, unspecified: Secondary | ICD-10-CM

## 2019-04-04 DIAGNOSIS — I2510 Atherosclerotic heart disease of native coronary artery without angina pectoris: Secondary | ICD-10-CM

## 2019-04-04 DIAGNOSIS — J90 Pleural effusion, not elsewhere classified: Secondary | ICD-10-CM

## 2019-04-04 DIAGNOSIS — R3121 Asymptomatic microscopic hematuria: Secondary | ICD-10-CM

## 2019-04-04 DIAGNOSIS — Z87448 Personal history of other diseases of urinary system: Secondary | ICD-10-CM

## 2019-04-04 DIAGNOSIS — M1A09X0 Idiopathic chronic gout, multiple sites, without tophus (tophi): Secondary | ICD-10-CM | POA: Diagnosis not present

## 2019-04-04 DIAGNOSIS — I3139 Other pericardial effusion (noninflammatory): Secondary | ICD-10-CM

## 2019-04-04 DIAGNOSIS — Z8709 Personal history of other diseases of the respiratory system: Secondary | ICD-10-CM

## 2019-04-04 DIAGNOSIS — E559 Vitamin D deficiency, unspecified: Secondary | ICD-10-CM

## 2019-04-04 DIAGNOSIS — Z9861 Coronary angioplasty status: Secondary | ICD-10-CM

## 2019-04-09 ENCOUNTER — Other Ambulatory Visit: Payer: Self-pay

## 2019-04-09 ENCOUNTER — Telehealth (HOSPITAL_COMMUNITY): Payer: Self-pay | Admitting: Cardiology

## 2019-04-09 ENCOUNTER — Ambulatory Visit (HOSPITAL_COMMUNITY)
Admission: RE | Admit: 2019-04-09 | Discharge: 2019-04-09 | Disposition: A | Discharge status: Home / Self Care | Payer: 59 | Source: Ambulatory Visit | Attending: Internal Medicine | Admitting: Internal Medicine

## 2019-04-09 DIAGNOSIS — D500 Iron deficiency anemia secondary to blood loss (chronic): Secondary | ICD-10-CM

## 2019-04-09 DIAGNOSIS — I2781 Cor pulmonale (chronic): Secondary | ICD-10-CM | POA: Diagnosis not present

## 2019-04-09 DIAGNOSIS — I313 Pericardial effusion (noninflammatory): Secondary | ICD-10-CM

## 2019-04-09 DIAGNOSIS — N189 Chronic kidney disease, unspecified: Secondary | ICD-10-CM

## 2019-04-09 DIAGNOSIS — M349 Systemic sclerosis, unspecified: Secondary | ICD-10-CM | POA: Diagnosis not present

## 2019-04-09 DIAGNOSIS — I2510 Atherosclerotic heart disease of native coronary artery without angina pectoris: Secondary | ICD-10-CM | POA: Diagnosis not present

## 2019-04-09 DIAGNOSIS — I129 Hypertensive chronic kidney disease with stage 1 through stage 4 chronic kidney disease, or unspecified chronic kidney disease: Secondary | ICD-10-CM

## 2019-04-09 DIAGNOSIS — I272 Pulmonary hypertension, unspecified: Secondary | ICD-10-CM

## 2019-04-09 DIAGNOSIS — K922 Gastrointestinal hemorrhage, unspecified: Secondary | ICD-10-CM

## 2019-04-09 DIAGNOSIS — I5022 Chronic systolic (congestive) heart failure: Secondary | ICD-10-CM

## 2019-04-09 DIAGNOSIS — F17200 Nicotine dependence, unspecified, uncomplicated: Secondary | ICD-10-CM

## 2019-04-09 DIAGNOSIS — I3139 Other pericardial effusion (noninflammatory): Secondary | ICD-10-CM

## 2019-04-09 DIAGNOSIS — Z955 Presence of coronary angioplasty implant and graft: Secondary | ICD-10-CM

## 2019-04-09 DIAGNOSIS — J90 Pleural effusion, not elsewhere classified: Secondary | ICD-10-CM

## 2019-04-09 DIAGNOSIS — Z720 Tobacco use: Secondary | ICD-10-CM

## 2019-04-09 NOTE — Progress Notes (Signed)
Spoke with patients wife, appts scheduled for lab, echo and f/u.  avs mailed with appt cards.  Verbalized appreciation.
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
## 2019-04-09 NOTE — Patient Instructions (Addendum)
No medication changes  Labs scheduled for January 18th ( see appt card) We will only contact you if something comes back abnormal or we need to make some changes. Otherwise no news is good news!  Your physician has requested that you have an echocardiogram. Echocardiography is a painless test that uses sound waves to create images of your heart. It provides your doctor with information about the size and shape of your heart and how well your heart's chambers and valves are working. This procedure takes approximately one hour. There are no restrictions for this procedure. This is scheduled for January 18th ( see appt card)  Your physician recommends that you schedule a follow-up appointment in: 2 months with Dr Haroldine Laws, you are scheduled for February 17th ( see appt card)  At the [facility], you and your health needs are our priority. As part of our continuing mission to provide you with exceptional heart care, we have created designated Provider Care Teams. These Care Teams include your primary Cardiologist (physician) and Advanced Practice Providers (APPs- Physician Assistants and Nurse Practitioners) who all work together to provide you with the care you need, when you need it.   You may see any of the following providers on your designated Care Team at your next follow up: [name] Dr Glori Bickers . Dr Loralie Champagne . Darrick Grinder, NP . Lyda Jester, PA . Audry Riles, PharmD   Please be sure to bring in all your medications bottles to every appointment.

## 2019-04-09 NOTE — Addendum Note (Signed)
Encounter addended by: Valeda Malm, RN on: 04/09/2019 11:08 AM  Actions taken: Visit diagnoses modified, Order list changed, Diagnosis association updated, Clinical Note Signed

## 2019-04-09 NOTE — Telephone Encounter (Signed)
The [employer] disability forms LTD completed Claim #[id number] Form and OV requested faxed back to [phone]  Copy mailed to patient as requested

## 2019-04-10 ENCOUNTER — Other Ambulatory Visit: Payer: Self-pay | Admitting: Physician Assistant

## 2019-04-16 ENCOUNTER — Telehealth: Payer: Self-pay | Admitting: Rheumatology

## 2019-04-16 NOTE — Telephone Encounter (Signed)
Spoke with [name] who stated she will call back after the holidays to schedule [name]'s 4 month follow-up appointment.

## 2019-04-16 NOTE — Telephone Encounter (Signed)
-----  Message from Shona Needles, RT sent at 04/04/2019 12:45 PM EST ----- Regarding: 3-4 MONTH F/U

## 2019-04-22 ENCOUNTER — Other Ambulatory Visit: Payer: Self-pay | Admitting: Thoracic Surgery (Cardiothoracic Vascular Surgery)

## 2019-05-05 ENCOUNTER — Other Ambulatory Visit: Payer: Self-pay | Admitting: Rheumatology

## 2019-05-05 NOTE — Telephone Encounter (Signed)
Last Visit: 04/04/2019 telemedicine  Next Visit: message sent to the front desk to schedule.  Uric acid: 03/07/2019 5.5  Okay to refill per Dr. Estanislado Pandy.

## 2019-05-12 ENCOUNTER — Other Ambulatory Visit: Payer: Self-pay

## 2019-05-12 ENCOUNTER — Ambulatory Visit (HOSPITAL_COMMUNITY)
Admission: RE | Admit: 2019-05-12 | Discharge: 2019-05-12 | Disposition: A | Discharge status: Home / Self Care | Payer: 59 | Source: Ambulatory Visit | Attending: Internal Medicine | Admitting: Internal Medicine

## 2019-05-12 ENCOUNTER — Ambulatory Visit (HOSPITAL_COMMUNITY)
Admission: RE | Admit: 2019-05-12 | Discharge: 2019-05-12 | Disposition: A | Discharge status: Home / Self Care | Payer: 59 | Source: Ambulatory Visit | Attending: Cardiology | Admitting: Cardiology

## 2019-05-12 DIAGNOSIS — I5022 Chronic systolic (congestive) heart failure: Secondary | ICD-10-CM | POA: Diagnosis present

## 2019-05-12 DIAGNOSIS — I252 Old myocardial infarction: Secondary | ICD-10-CM | POA: Insufficient documentation

## 2019-05-12 DIAGNOSIS — I083 Combined rheumatic disorders of mitral, aortic and tricuspid valves: Secondary | ICD-10-CM | POA: Diagnosis not present

## 2019-05-12 DIAGNOSIS — F17200 Nicotine dependence, unspecified, uncomplicated: Secondary | ICD-10-CM | POA: Insufficient documentation

## 2019-05-12 DIAGNOSIS — I313 Pericardial effusion (noninflammatory): Secondary | ICD-10-CM | POA: Insufficient documentation

## 2019-05-12 DIAGNOSIS — I110 Hypertensive heart disease with heart failure: Secondary | ICD-10-CM | POA: Insufficient documentation

## 2019-05-12 DIAGNOSIS — I2510 Atherosclerotic heart disease of native coronary artery without angina pectoris: Secondary | ICD-10-CM | POA: Diagnosis not present

## 2019-05-12 LAB — CBC
HCT: 30.7 % — ABNORMAL LOW (ref 39.0–52.0)
Hemoglobin: 9.3 g/dL — ABNORMAL LOW (ref 13.0–17.0)
MCH: 30.3 pg (ref 26.0–34.0)
MCHC: 30.3 g/dL (ref 30.0–36.0)
MCV: 100 fL (ref 80.0–100.0)
Platelets: 203 10*3/uL (ref 150–400)
RBC: 3.07 MIL/uL — ABNORMAL LOW (ref 4.22–5.81)
RDW: 17.4 % — ABNORMAL HIGH (ref 11.5–15.5)
WBC: 5 10*3/uL (ref 4.0–10.5)
nRBC: 0 % (ref 0.0–0.2)

## 2019-05-12 LAB — COMPREHENSIVE METABOLIC PANEL
ALT: 11 U/L (ref 0–44)
AST: 18 U/L (ref 15–41)
Albumin: 3.4 g/dL — ABNORMAL LOW (ref 3.5–5.0)
Alkaline Phosphatase: 72 U/L (ref 38–126)
Anion gap: 13 (ref 5–15)
BUN: 57 mg/dL — ABNORMAL HIGH (ref 6–20)
CO2: 19 mmol/L — ABNORMAL LOW (ref 22–32)
Calcium: 7.7 mg/dL — ABNORMAL LOW (ref 8.9–10.3)
Chloride: 108 mmol/L (ref 98–111)
Creatinine, Ser: 2.68 mg/dL — ABNORMAL HIGH (ref 0.61–1.24)
GFR calc Af Amer: 29 mL/min — ABNORMAL LOW (ref >60)
GFR calc non Af Amer: 25 mL/min — ABNORMAL LOW (ref >60)
Glucose, Bld: 94 mg/dL (ref 70–99)
Potassium: 3.9 mmol/L (ref 3.5–5.1)
Sodium: 140 mmol/L (ref 135–145)
Total Bilirubin: 0.6 mg/dL (ref 0.3–1.2)
Total Protein: 6.6 g/dL (ref 6.5–8.1)

## 2019-05-12 LAB — BRAIN NATRIURETIC PEPTIDE: B Natriuretic Peptide: 297 pg/mL — ABNORMAL HIGH (ref 0.0–100.0)

## 2019-05-12 NOTE — Progress Notes (Signed)
  Echocardiogram 2D Echocardiogram has been performed.  [name] 05/12/2019, 11:42 AM

## 2019-05-16 ENCOUNTER — Other Ambulatory Visit (HOSPITAL_COMMUNITY): Payer: Self-pay | Admitting: Internal Medicine

## 2019-06-09 ENCOUNTER — Ambulatory Visit: Payer: 59 | Attending: Internal Medicine

## 2019-06-09 DIAGNOSIS — Z20822 Contact with and (suspected) exposure to covid-19: Secondary | ICD-10-CM

## 2019-06-10 LAB — NOVEL CORONAVIRUS, NAA: SARS-CoV-2, NAA: NOT DETECTED

## 2019-06-11 ENCOUNTER — Other Ambulatory Visit: Payer: Self-pay

## 2019-06-11 ENCOUNTER — Ambulatory Visit (HOSPITAL_COMMUNITY)
Admission: RE | Admit: 2019-06-11 | Discharge: 2019-06-11 | Disposition: A | Discharge status: Home / Self Care | Payer: 59 | Source: Ambulatory Visit | Attending: Internal Medicine | Admitting: Internal Medicine

## 2019-06-11 ENCOUNTER — Encounter (HOSPITAL_COMMUNITY): Payer: Self-pay | Admitting: Internal Medicine

## 2019-06-11 VITALS — BP 164/84 | HR 78 | Wt 122.6 lb

## 2019-06-11 DIAGNOSIS — J90 Pleural effusion, not elsewhere classified: Secondary | ICD-10-CM | POA: Diagnosis not present

## 2019-06-11 DIAGNOSIS — I10 Essential (primary) hypertension: Secondary | ICD-10-CM

## 2019-06-11 DIAGNOSIS — E785 Hyperlipidemia, unspecified: Secondary | ICD-10-CM | POA: Insufficient documentation

## 2019-06-11 DIAGNOSIS — Z79899 Other long term (current) drug therapy: Secondary | ICD-10-CM | POA: Insufficient documentation

## 2019-06-11 DIAGNOSIS — I272 Pulmonary hypertension, unspecified: Secondary | ICD-10-CM | POA: Diagnosis not present

## 2019-06-11 DIAGNOSIS — I313 Pericardial effusion (noninflammatory): Secondary | ICD-10-CM | POA: Insufficient documentation

## 2019-06-11 DIAGNOSIS — I130 Hypertensive heart and chronic kidney disease with heart failure and stage 1 through stage 4 chronic kidney disease, or unspecified chronic kidney disease: Secondary | ICD-10-CM | POA: Insufficient documentation

## 2019-06-11 DIAGNOSIS — I50810 Right heart failure, unspecified: Secondary | ICD-10-CM | POA: Insufficient documentation

## 2019-06-11 DIAGNOSIS — M109 Gout, unspecified: Secondary | ICD-10-CM | POA: Insufficient documentation

## 2019-06-11 DIAGNOSIS — R42 Dizziness and giddiness: Secondary | ICD-10-CM | POA: Diagnosis not present

## 2019-06-11 DIAGNOSIS — Z87891 Personal history of nicotine dependence: Secondary | ICD-10-CM | POA: Insufficient documentation

## 2019-06-11 DIAGNOSIS — F419 Anxiety disorder, unspecified: Secondary | ICD-10-CM | POA: Diagnosis not present

## 2019-06-11 DIAGNOSIS — I2510 Atherosclerotic heart disease of native coronary artery without angina pectoris: Secondary | ICD-10-CM | POA: Diagnosis not present

## 2019-06-11 DIAGNOSIS — Z955 Presence of coronary angioplasty implant and graft: Secondary | ICD-10-CM | POA: Diagnosis not present

## 2019-06-11 DIAGNOSIS — I252 Old myocardial infarction: Secondary | ICD-10-CM | POA: Diagnosis not present

## 2019-06-11 DIAGNOSIS — N179 Acute kidney failure, unspecified: Secondary | ICD-10-CM | POA: Insufficient documentation

## 2019-06-11 DIAGNOSIS — I3139 Other pericardial effusion (noninflammatory): Secondary | ICD-10-CM

## 2019-06-11 DIAGNOSIS — N1832 Chronic kidney disease, stage 3b: Secondary | ICD-10-CM | POA: Diagnosis not present

## 2019-06-11 DIAGNOSIS — Z841 Family history of disorders of kidney and ureter: Secondary | ICD-10-CM | POA: Diagnosis not present

## 2019-06-11 DIAGNOSIS — M349 Systemic sclerosis, unspecified: Secondary | ICD-10-CM | POA: Diagnosis not present

## 2019-06-11 DIAGNOSIS — I5022 Chronic systolic (congestive) heart failure: Secondary | ICD-10-CM | POA: Insufficient documentation

## 2019-06-11 LAB — CBC
HCT: 29.9 % — ABNORMAL LOW (ref 39.0–52.0)
Hemoglobin: 8.8 g/dL — ABNORMAL LOW (ref 13.0–17.0)
MCH: 30.1 pg (ref 26.0–34.0)
MCHC: 29.4 g/dL — ABNORMAL LOW (ref 30.0–36.0)
MCV: 102.4 fL — ABNORMAL HIGH (ref 80.0–100.0)
Platelets: 157 10*3/uL (ref 150–400)
RBC: 2.92 MIL/uL — ABNORMAL LOW (ref 4.22–5.81)
RDW: 15.9 % — ABNORMAL HIGH (ref 11.5–15.5)
WBC: 3.9 10*3/uL — ABNORMAL LOW (ref 4.0–10.5)
nRBC: 0 % (ref 0.0–0.2)

## 2019-06-11 LAB — COMPREHENSIVE METABOLIC PANEL
ALT: 11 U/L (ref 0–44)
AST: 18 U/L (ref 15–41)
Albumin: 3.5 g/dL (ref 3.5–5.0)
Alkaline Phosphatase: 60 U/L (ref 38–126)
Anion gap: 7 (ref 5–15)
BUN: 63 mg/dL — ABNORMAL HIGH (ref 6–20)
CO2: 19 mmol/L — ABNORMAL LOW (ref 22–32)
Calcium: 7.6 mg/dL — ABNORMAL LOW (ref 8.9–10.3)
Chloride: 113 mmol/L — ABNORMAL HIGH (ref 98–111)
Creatinine, Ser: 2.75 mg/dL — ABNORMAL HIGH (ref 0.61–1.24)
GFR calc Af Amer: 28 mL/min — ABNORMAL LOW (ref >60)
GFR calc non Af Amer: 24 mL/min — ABNORMAL LOW (ref >60)
Glucose, Bld: 98 mg/dL (ref 70–99)
Potassium: 4 mmol/L (ref 3.5–5.1)
Sodium: 139 mmol/L (ref 135–145)
Total Bilirubin: 0.4 mg/dL (ref 0.3–1.2)
Total Protein: 6.6 g/dL (ref 6.5–8.1)

## 2019-06-11 LAB — BRAIN NATRIURETIC PEPTIDE: B Natriuretic Peptide: 197.3 pg/mL — ABNORMAL HIGH (ref 0.0–100.0)

## 2019-06-11 LAB — TSH: TSH: 9.776 u[IU]/mL — ABNORMAL HIGH (ref 0.350–4.500)

## 2019-06-11 MED ORDER — SILDENAFIL CITRATE 20 MG PO TABS: 40.0000 mg | ORAL_TABLET | Freq: Three times a day (TID) | ORAL | 6 refills | Status: DC

## 2019-06-11 MED ORDER — AMLODIPINE BESYLATE 5 MG PO TABS: 5.0000 mg | ORAL_TABLET | Freq: Every day | ORAL | 1 refills | Status: DC

## 2019-06-11 NOTE — Patient Instructions (Signed)
Lab work done today. We will notify you of any abnormal lab work. No news is good news!  EKG done today.  START Amlodipine 86m tab daily.  INCREASE Sildenafil to 426m(2 tabs) three times daily.  Your provider has recommended that  you wear a Zio Patch for 14 days.  This monitor will record your heart rhythm for our review.  IF you have any symptoms while wearing the monitor please press the button.  If you have any issues with the patch or you notice a red or orange light on it please call the company at [phone] Once you remove the patch please mail it back to the company as soon as possible so we can get the results.  [facility]  [facility] and your [facility] needs are our priority. As part of our continuing mission to provide you with exceptional heart care, we have created designated Provider Care Teams. These Care Teams include your primary Cardiologist (physician) and Advanced Practice Providers (APPs- Physician Assistants and Nurse Practitioners) who all work together to provide you with the care you need, when you need it.   You may see any of the following providers on your designated Care Team at your next follow up: . Marland Kitchenr DaGlori Bickers Dr DaLoralie Champagne AmDarrick GrinderNP . BrLyda JesterPA . LaAudry RilesPharmD   Please be sure to bring in all your medications bottles to every appointment.

## 2019-06-11 NOTE — Addendum Note (Signed)
Encounter addended by: Marlise Eves, RN on: 06/11/2019 12:11 PM  Actions taken: Visit diagnoses modified, Diagnosis association updated, Order list changed, Charge Capture section accepted, Clinical Note Signed
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
## 2019-06-30 ENCOUNTER — Telehealth (HOSPITAL_COMMUNITY): Payer: Self-pay | Admitting: Pharmacist

## 2019-06-30 NOTE — Telephone Encounter (Signed)
Patient Advocate Encounter   Received notification from [employer] that prior authorization for Opsumit is required.   PA submitted on CoverMyMeds Key [id number] Status is pending   Will continue to follow.  Audry Riles, PharmD, BCPS, BCCP, CPP Heart Failure Clinic Pharmacist [phone]

## 2019-07-01 NOTE — Telephone Encounter (Signed)
Advanced Heart Failure Patient Advocate Encounter  Prior Authorization for Opsumit has been approved.    PA#  [id number] Effective dates: 06/30/19 through 06/29/20  Audry Riles, PharmD, BCPS, BCCP, CPP Heart Failure Clinic Pharmacist [phone]

## 2019-07-03 NOTE — Addendum Note (Signed)
Encounter addended by: Micki Riley, RN on: 07/03/2019 3:52 PM  Actions taken: Imaging Exam ended

## 2019-07-04 ENCOUNTER — Other Ambulatory Visit (HOSPITAL_COMMUNITY): Payer: Self-pay

## 2019-07-04 MED ORDER — OPSUMIT 10 MG PO TABS: 10.0000 mg | ORAL_TABLET | Freq: Every day | ORAL | 11 refills | Status: DC

## 2019-07-14 ENCOUNTER — Other Ambulatory Visit (HOSPITAL_COMMUNITY): Payer: Self-pay

## 2019-07-14 MED ORDER — OPSUMIT 10 MG PO TABS: 10.0000 mg | ORAL_TABLET | Freq: Every day | ORAL | 3 refills | Status: DC

## 2019-07-18 ENCOUNTER — Ambulatory Visit: Payer: 59 | Attending: Internal Medicine

## 2019-07-18 DIAGNOSIS — Z23 Encounter for immunization: Secondary | ICD-10-CM

## 2019-07-18 NOTE — Progress Notes (Signed)
   Covid-19 Vaccination Clinic  Name:  KENNEITH STIEF    MRN: [id number] DOB: 1960-04-27  07/18/2019  Mr. Ardizzone was observed post Covid-19 immunization for 15 minutes without incident. He was provided with Vaccine Information Sheet and instruction to access the V-Safe system.   Mr. Zetina was instructed to call 911 with any severe reactions post vaccine: [name] Difficulty breathing  . Swelling of face and throat  . A fast heartbeat  . A bad rash all over body  . Dizziness and weakness   Immunizations Administered    Name Date Dose VIS Date Route   Pfizer COVID-19 Vaccine 07/18/2019  9:30 AM 0.3 mL 04/04/2019 Intramuscular   Manufacturer: [place]   Lot: [id number]   [place]: [address]-3

## 2019-08-03 ENCOUNTER — Telehealth: Payer: Self-pay | Admitting: Rheumatology

## 2019-08-04 NOTE — Telephone Encounter (Signed)
Last Visit: 04/04/2019 telemedicine  Next Visit: message sent to the front desk to schedule.  Uric acid: 03/07/2019 5.5  Okay to refill per Dr. Estanislado Pandy

## 2019-08-04 NOTE — Telephone Encounter (Signed)
LMOM for patient to call and schedule follow-up appointment.   °

## 2019-08-04 NOTE — Telephone Encounter (Signed)
Please schedule patient a follow up visit.  Patient due April 2021. Thanks!

## 2019-08-11 ENCOUNTER — Ambulatory Visit: Payer: 59 | Attending: Internal Medicine

## 2019-08-11 DIAGNOSIS — Z23 Encounter for immunization: Secondary | ICD-10-CM

## 2019-08-11 NOTE — Progress Notes (Signed)
   Covid-19 Vaccination Clinic  Name:  [name]    MRN: [id number] DOB: Aug 05, 1960  08/11/2019  Mr. [name] was observed post Covid-19 immunization for 15 minutes without incident. He was provided with Vaccine Information Sheet and instruction to access the V-Safe system.   Mr. [name] was instructed to call 911 with any severe reactions post vaccine: [name] Difficulty breathing  . Swelling of face and throat  . A fast heartbeat  . A bad rash all over body  . Dizziness and weakness   Immunizations Administered    Name Date Dose VIS Date Route   Pfizer COVID-19 Vaccine 08/11/2019  2:46 PM 0.3 mL 06/18/2018 Intramuscular   Manufacturer: [place]   Lot: [id number]   [place]: [address]-8

## 2019-08-29 ENCOUNTER — Encounter (HOSPITAL_COMMUNITY): Payer: 59

## 2019-09-01 ENCOUNTER — Other Ambulatory Visit (HOSPITAL_COMMUNITY): Payer: Self-pay | Admitting: *Deleted

## 2019-09-02 ENCOUNTER — Other Ambulatory Visit: Payer: Self-pay

## 2019-09-02 ENCOUNTER — Ambulatory Visit (HOSPITAL_COMMUNITY)
Admission: RE | Admit: 2019-09-02 | Discharge: 2019-09-02 | Disposition: A | Discharge status: Home / Self Care | Payer: 59 | Source: Ambulatory Visit | Attending: Nephrology | Admitting: Nephrology

## 2019-09-02 VITALS — BP 138/74 | HR 76 | Temp 97.7°F | Resp 18

## 2019-09-02 DIAGNOSIS — D631 Anemia in chronic kidney disease: Secondary | ICD-10-CM | POA: Insufficient documentation

## 2019-09-02 DIAGNOSIS — N183 Chronic kidney disease, stage 3 unspecified: Secondary | ICD-10-CM | POA: Insufficient documentation

## 2019-09-02 LAB — POCT HEMOGLOBIN-HEMACUE: Hemoglobin: 7.9 g/dL — ABNORMAL LOW (ref 13.0–17.0)

## 2019-09-02 MED ORDER — EPOETIN ALFA-EPBX 10000 UNIT/ML IJ SOLN
INTRAMUSCULAR | Status: AC
  Filled 2019-09-02: qty 1

## 2019-09-02 MED ORDER — SODIUM CHLORIDE 0.9 % IV SOLN
510.0000 mg | INTRAVENOUS | Status: DC
  Administered 2019-09-02: 09:00:00 510 mg via INTRAVENOUS
  Filled 2019-09-02: qty 17

## 2019-09-02 MED ORDER — EPOETIN ALFA-EPBX 10000 UNIT/ML IJ SOLN
10000.0000 [IU] | INTRAMUSCULAR | Status: DC
  Administered 2019-09-02: 10:00:00 10000 [IU] via SUBCUTANEOUS

## 2019-09-02 NOTE — Discharge Instructions (Signed)
Epoetin Alfa injection What is this medicine? EPOETIN ALFA (e POE e tin AL fa) helps your body make more red blood cells. This medicine is used to treat anemia caused by chronic kidney disease, cancer chemotherapy, or HIV-therapy. It may also be used before surgery if you have anemia. This medicine may be used for other purposes; ask your health care provider or pharmacist if you have questions. COMMON BRAND NAME(S): Epogen, Procrit, Retacrit What should I tell my health care provider before I take this medicine? They need to know if you have any of these conditions:  cancer  heart disease  high blood pressure  history of blood clots  history of stroke  low levels of folate, iron, or vitamin B12 in the blood  seizures  an unusual or allergic reaction to erythropoietin, albumin, benzyl alcohol, hamster proteins, other medicines, foods, dyes, or preservatives  pregnant or trying to get pregnant  breast-feeding How should I use this medicine? This medicine is for injection into a vein or under the skin. It is usually given by a health care professional in a hospital or clinic setting. If you get this medicine at home, you will be taught how to prepare and give this medicine. Use exactly as directed. Take your medicine at regular intervals. Do not take your medicine more often than directed. It is important that you put your used needles and syringes in a special sharps container. Do not put them in a trash can. If you do not have a sharps container, call your pharmacist or healthcare provider to get one. A special MedGuide will be given to you by the pharmacist with each prescription and refill. Be sure to read this information carefully each time. Talk to your pediatrician regarding the use of this medicine in children. While this drug may be prescribed for selected conditions, precautions do apply. Overdosage: If you think you have taken too much of this medicine contact a  poison control center or emergency room at once. NOTE: This medicine is only for you. Do not share this medicine with others. What if I miss a dose? If you miss a dose, take it as soon as you can. If it is almost time for your next dose, take only that dose. Do not take double or extra doses. What may interact with this medicine? Interactions have not been studied. This list may not describe all possible interactions. Give your health care provider a list of all the medicines, herbs, non-prescription drugs, or dietary supplements you use. Also tell them if you smoke, drink alcohol, or use illegal drugs. Some items may interact with your medicine. What should I watch for while using this medicine? Your condition will be monitored carefully while you are receiving this medicine. You may need blood work done while you are taking this medicine. This medicine may cause a decrease in vitamin B6. You should make sure that you get enough vitamin B6 while you are taking this medicine. Discuss the foods you eat and the vitamins you take with your health care professional. What side effects may I notice from receiving this medicine? Side effects that you should report to your doctor or health care professional as soon as possible:  allergic reactions like skin rash, itching or hives, swelling of the face, lips, or tongue  seizures  signs and symptoms of a blood clot such as breathing problems; changes in vision; chest pain; severe, sudden headache; pain, swelling, warmth in the leg; trouble speaking; sudden  numbness or weakness of the face, arm or leg  signs and symptoms of a stroke like changes in vision; confusion; trouble speaking or understanding; severe headaches; sudden numbness or weakness of the face, arm or leg; trouble walking; dizziness; loss of balance or coordination Side effects that usually do not require medical attention (report to your doctor or health care professional if they continue  or are bothersome):  chills  cough  dizziness  fever  headaches  joint pain  muscle cramps  muscle pain  nausea, vomiting  pain, redness, or irritation at site where injected This list may not describe all possible side effects. Call your doctor for medical advice about side effects. You may report side effects to FDA at 1-800-FDA-1088. Where should I keep my medicine? Keep out of the reach of children. Store in a refrigerator between 2 and 8 degrees C (36 and 46 degrees F). Do not freeze or shake. Throw away any unused portion if using a single-dose vial. Multi-dose vials can be kept in the refrigerator for up to 21 days after the initial dose. Throw away unused medicine. NOTE: This sheet is a summary. It may not cover all possible information. If you have questions about this medicine, talk to your doctor, pharmacist, or health care provider.  2020 Elsevier/Gold Standard (2016-11-17 08:35:19) Ferumoxytol injection What is this medicine? FERUMOXYTOL is an iron complex. Iron is used to make healthy red blood cells, which carry oxygen and nutrients throughout the body. This medicine is used to treat iron deficiency anemia. This medicine may be used for other purposes; ask your health care provider or pharmacist if you have questions. COMMON BRAND NAME(S): Feraheme What should I tell my health care provider before I take this medicine? They need to know if you have any of these conditions:  anemia not caused by low iron levels  high levels of iron in the blood  magnetic resonance imaging (MRI) test scheduled  an unusual or allergic reaction to iron, other medicines, foods, dyes, or preservatives  pregnant or trying to get pregnant  breast-feeding How should I use this medicine? This medicine is for injection into a vein. It is given by a health care professional in a hospital or clinic setting. Talk to your pediatrician regarding the use of this medicine in children.  Special care may be needed. Overdosage: If you think you have taken too much of this medicine contact a poison control center or emergency room at once. NOTE: This medicine is only for you. Do not share this medicine with others. What if I miss a dose? It is important not to miss your dose. Call your doctor or health care professional if you are unable to keep an appointment. What may interact with this medicine? This medicine may interact with the following medications:  other iron products This list may not describe all possible interactions. Give your health care provider a list of all the medicines, herbs, non-prescription drugs, or dietary supplements you use. Also tell them if you smoke, drink alcohol, or use illegal drugs. Some items may interact with your medicine. What should I watch for while using this medicine? Visit your doctor or healthcare professional regularly. Tell your doctor or healthcare professional if your symptoms do not start to get better or if they get worse. You may need blood work done while you are taking this medicine. You may need to follow a special diet. Talk to your doctor. Foods that contain iron include: whole grains/cereals, dried fruits,  beans, or peas, leafy green vegetables, and organ meats (liver, kidney). What side effects may I notice from receiving this medicine? Side effects that you should report to your doctor or health care professional as soon as possible:  allergic reactions like skin rash, itching or hives, swelling of the face, lips, or tongue  breathing problems  changes in blood pressure  feeling faint or lightheaded, falls  fever or chills  flushing, sweating, or hot feelings  swelling of the ankles or feet Side effects that usually do not require medical attention (report to your doctor or health care professional if they continue or are bothersome):  diarrhea  headache  nausea, vomiting  stomach pain This list may not  describe all possible side effects. Call your doctor for medical advice about side effects. You may report side effects to FDA at 1-800-FDA-1088. Where should I keep my medicine? This drug is given in a hospital or clinic and will not be stored at home. NOTE: This sheet is a summary. It may not cover all possible information. If you have questions about this medicine, talk to your doctor, pharmacist, or health care provider.  2020 Elsevier/Gold Standard (2016-05-29 20:21:10)

## 2019-09-08 ENCOUNTER — Ambulatory Visit (HOSPITAL_COMMUNITY)
Admission: RE | Admit: 2019-09-08 | Discharge: 2019-09-08 | Disposition: A | Discharge status: Home / Self Care | Payer: 59 | Source: Ambulatory Visit | Attending: Internal Medicine | Admitting: Internal Medicine

## 2019-09-08 ENCOUNTER — Other Ambulatory Visit: Payer: Self-pay

## 2019-09-08 ENCOUNTER — Encounter (HOSPITAL_COMMUNITY): Payer: Self-pay | Admitting: Internal Medicine

## 2019-09-08 VITALS — BP 148/80 | HR 81 | Wt 121.2 lb

## 2019-09-08 DIAGNOSIS — F419 Anxiety disorder, unspecified: Secondary | ICD-10-CM | POA: Diagnosis not present

## 2019-09-08 DIAGNOSIS — I5022 Chronic systolic (congestive) heart failure: Secondary | ICD-10-CM

## 2019-09-08 DIAGNOSIS — I272 Pulmonary hypertension, unspecified: Secondary | ICD-10-CM | POA: Diagnosis not present

## 2019-09-08 DIAGNOSIS — Z79899 Other long term (current) drug therapy: Secondary | ICD-10-CM | POA: Diagnosis not present

## 2019-09-08 DIAGNOSIS — M349 Systemic sclerosis, unspecified: Secondary | ICD-10-CM | POA: Insufficient documentation

## 2019-09-08 DIAGNOSIS — I252 Old myocardial infarction: Secondary | ICD-10-CM | POA: Diagnosis not present

## 2019-09-08 DIAGNOSIS — I3139 Other pericardial effusion (noninflammatory): Secondary | ICD-10-CM

## 2019-09-08 DIAGNOSIS — N1832 Chronic kidney disease, stage 3b: Secondary | ICD-10-CM | POA: Insufficient documentation

## 2019-09-08 DIAGNOSIS — Z87891 Personal history of nicotine dependence: Secondary | ICD-10-CM | POA: Diagnosis not present

## 2019-09-08 DIAGNOSIS — I130 Hypertensive heart and chronic kidney disease with heart failure and stage 1 through stage 4 chronic kidney disease, or unspecified chronic kidney disease: Secondary | ICD-10-CM | POA: Insufficient documentation

## 2019-09-08 DIAGNOSIS — E785 Hyperlipidemia, unspecified: Secondary | ICD-10-CM | POA: Insufficient documentation

## 2019-09-08 DIAGNOSIS — I2510 Atherosclerotic heart disease of native coronary artery without angina pectoris: Secondary | ICD-10-CM | POA: Diagnosis not present

## 2019-09-08 DIAGNOSIS — I313 Pericardial effusion (noninflammatory): Secondary | ICD-10-CM | POA: Diagnosis not present

## 2019-09-08 DIAGNOSIS — I10 Essential (primary) hypertension: Secondary | ICD-10-CM

## 2019-09-08 DIAGNOSIS — I50810 Right heart failure, unspecified: Secondary | ICD-10-CM | POA: Diagnosis not present

## 2019-09-08 DIAGNOSIS — D649 Anemia, unspecified: Secondary | ICD-10-CM | POA: Diagnosis not present

## 2019-09-08 DIAGNOSIS — M109 Gout, unspecified: Secondary | ICD-10-CM | POA: Diagnosis not present

## 2019-09-08 DIAGNOSIS — Z955 Presence of coronary angioplasty implant and graft: Secondary | ICD-10-CM | POA: Insufficient documentation

## 2019-09-08 DIAGNOSIS — R42 Dizziness and giddiness: Secondary | ICD-10-CM | POA: Insufficient documentation

## 2019-09-08 DIAGNOSIS — E039 Hypothyroidism, unspecified: Secondary | ICD-10-CM | POA: Insufficient documentation

## 2019-09-08 NOTE — Patient Instructions (Signed)
Your physician recommends that you schedule a follow-up appointment in: 4 months with echocardiogram  If you have any questions or concerns before your next appointment please send [place] a message through [place] or call our office at [phone].  At the [facility], you and your health needs are our priority. As part of our continuing mission to provide you with exceptional heart care, we have created designated Provider Care Teams. These Care Teams include your primary Cardiologist (physician) and Advanced Practice Providers (APPs- Physician Assistants and Nurse Practitioners) who all work together to provide you with the care you need, when you need it.   You may see any of the following providers on your designated Care Team at your next follow up: [name] Dr Glori Bickers . Dr Loralie Champagne . Darrick Grinder, NP . Lyda Jester, PA . Audry Riles, PharmD   Please be sure to bring in all your medications bottles to every appointment.

## 2019-09-08 NOTE — Addendum Note (Signed)
Encounter addended by: Scarlette Calico, RN on: 09/08/2019 10:54 AM  Actions taken: Clinical Note Signed

## 2019-09-08 NOTE — Addendum Note (Signed)
Encounter addended by: Scarlette Calico, RN on: 09/08/2019 10:58 AM  Actions taken: Visit diagnoses modified, Order list changed, Diagnosis association updated
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
## 2019-09-09 ENCOUNTER — Ambulatory Visit (HOSPITAL_COMMUNITY)
Admission: RE | Admit: 2019-09-09 | Discharge: 2019-09-09 | Disposition: A | Discharge status: Home / Self Care | Payer: 59 | Source: Ambulatory Visit | Attending: Nephrology | Admitting: Nephrology

## 2019-09-09 ENCOUNTER — Other Ambulatory Visit: Payer: Self-pay

## 2019-09-09 DIAGNOSIS — D631 Anemia in chronic kidney disease: Secondary | ICD-10-CM | POA: Diagnosis not present

## 2019-09-09 DIAGNOSIS — N183 Chronic kidney disease, stage 3 unspecified: Secondary | ICD-10-CM | POA: Diagnosis not present

## 2019-09-09 MED ORDER — SODIUM CHLORIDE 0.9 % IV SOLN
510.0000 mg | INTRAVENOUS | Status: AC
  Administered 2019-09-09: 510 mg via INTRAVENOUS
  Filled 2019-09-09: qty 17
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
## 2019-09-19 ENCOUNTER — Telehealth: Payer: Self-pay | Admitting: Gastroenterology

## 2019-09-19 ENCOUNTER — Other Ambulatory Visit: Payer: Self-pay | Admitting: Gastroenterology

## 2019-09-19 ENCOUNTER — Other Ambulatory Visit (INDEPENDENT_AMBULATORY_CARE_PROVIDER_SITE_OTHER): Payer: 59

## 2019-09-19 DIAGNOSIS — K5520 Angiodysplasia of colon without hemorrhage: Secondary | ICD-10-CM

## 2019-09-19 DIAGNOSIS — K2970 Gastritis, unspecified, without bleeding: Secondary | ICD-10-CM | POA: Diagnosis not present

## 2019-09-19 DIAGNOSIS — K219 Gastro-esophageal reflux disease without esophagitis: Secondary | ICD-10-CM | POA: Diagnosis not present

## 2019-09-19 DIAGNOSIS — K2990 Gastroduodenitis, unspecified, without bleeding: Secondary | ICD-10-CM | POA: Diagnosis not present

## 2019-09-19 LAB — COMPREHENSIVE METABOLIC PANEL
ALT: 7 U/L (ref 0–53)
AST: 15 U/L (ref 0–37)
Albumin: 4 g/dL (ref 3.5–5.2)
Alkaline Phosphatase: 64 U/L (ref 39–117)
BUN: 58 mg/dL — ABNORMAL HIGH (ref 6–23)
CO2: 23 mEq/L (ref 19–32)
Calcium: 7.5 mg/dL — ABNORMAL LOW (ref 8.4–10.5)
Chloride: 108 mEq/L (ref 96–112)
Creatinine, Ser: 2.97 mg/dL — ABNORMAL HIGH (ref 0.40–1.50)
GFR: 26.18 mL/min — ABNORMAL LOW (ref >60.00)
Glucose, Bld: 92 mg/dL (ref 70–99)
Potassium: 3.8 mEq/L (ref 3.5–5.1)
Sodium: 140 mEq/L (ref 135–145)
Total Bilirubin: 0.3 mg/dL (ref 0.2–1.2)
Total Protein: 6.9 g/dL (ref 6.0–8.3)

## 2019-09-19 LAB — CBC WITH DIFFERENTIAL/PLATELET
Basophils Absolute: 0 10*3/uL (ref 0.0–0.1)
Basophils Relative: 1 % (ref 0.0–3.0)
Eosinophils Absolute: 0.3 10*3/uL (ref 0.0–0.7)
Eosinophils Relative: 6.9 % — ABNORMAL HIGH (ref 0.0–5.0)
HCT: 26.3 % — ABNORMAL LOW (ref 39.0–52.0)
Hemoglobin: 8.5 g/dL — ABNORMAL LOW (ref 13.0–17.0)
Lymphocytes Relative: 20.7 % (ref 12.0–46.0)
Lymphs Abs: 0.9 10*3/uL (ref 0.7–4.0)
MCHC: 32.3 g/dL (ref 30.0–36.0)
MCV: 97.1 fl (ref 78.0–100.0)
Monocytes Absolute: 0.6 10*3/uL (ref 0.1–1.0)
Monocytes Relative: 13.8 % — ABNORMAL HIGH (ref 3.0–12.0)
Neutro Abs: 2.4 10*3/uL (ref 1.4–7.7)
Neutrophils Relative %: 57.6 % (ref 43.0–77.0)
Platelets: 135 10*3/uL — ABNORMAL LOW (ref 150.0–400.0)
RBC: 2.71 Mil/uL — ABNORMAL LOW (ref 4.22–5.81)
RDW: 18.9 % — ABNORMAL HIGH (ref 11.5–15.5)
WBC: 4.2 10*3/uL (ref 4.0–10.5)

## 2019-09-19 NOTE — Telephone Encounter (Signed)
Spoke to patients wife who states that patient noted BRB in his mouth and coated on tongue this morning when he woke up.Addressed with Dr Lyndel Safe who ordered blood work and to increase his Protonix to 40 mg twice daily for 2 weeks then back to daily. Patient has a follow up appointment with Dr Bryan Lemma on 10/02/19. He knows to go to the ER over the weekend if his symptoms worsen. All questions answered. The wife voiced understanding.

## 2019-09-23 ENCOUNTER — Other Ambulatory Visit: Payer: Self-pay

## 2019-09-23 ENCOUNTER — Encounter: Payer: Self-pay | Admitting: Rheumatology

## 2019-09-23 ENCOUNTER — Ambulatory Visit (INDEPENDENT_AMBULATORY_CARE_PROVIDER_SITE_OTHER): Payer: 59 | Admitting: Rheumatology

## 2019-09-23 VITALS — BP 119/74 | HR 74 | Resp 16 | Ht 67.5 in | Wt 122.0 lb

## 2019-09-23 DIAGNOSIS — I272 Pulmonary hypertension, unspecified: Secondary | ICD-10-CM

## 2019-09-23 DIAGNOSIS — I3139 Other pericardial effusion (noninflammatory): Secondary | ICD-10-CM

## 2019-09-23 DIAGNOSIS — Z8679 Personal history of other diseases of the circulatory system: Secondary | ICD-10-CM

## 2019-09-23 DIAGNOSIS — R7989 Other specified abnormal findings of blood chemistry: Secondary | ICD-10-CM

## 2019-09-23 DIAGNOSIS — M349 Systemic sclerosis, unspecified: Secondary | ICD-10-CM | POA: Diagnosis not present

## 2019-09-23 DIAGNOSIS — I7300 Raynaud's syndrome without gangrene: Secondary | ICD-10-CM | POA: Diagnosis not present

## 2019-09-23 DIAGNOSIS — R3121 Asymptomatic microscopic hematuria: Secondary | ICD-10-CM

## 2019-09-23 DIAGNOSIS — I313 Pericardial effusion (noninflammatory): Secondary | ICD-10-CM | POA: Diagnosis not present

## 2019-09-23 DIAGNOSIS — E559 Vitamin D deficiency, unspecified: Secondary | ICD-10-CM

## 2019-09-23 DIAGNOSIS — J90 Pleural effusion, not elsewhere classified: Secondary | ICD-10-CM

## 2019-09-23 DIAGNOSIS — I2510 Atherosclerotic heart disease of native coronary artery without angina pectoris: Secondary | ICD-10-CM

## 2019-09-23 DIAGNOSIS — Z87448 Personal history of other diseases of urinary system: Secondary | ICD-10-CM

## 2019-09-23 DIAGNOSIS — Z8709 Personal history of other diseases of the respiratory system: Secondary | ICD-10-CM

## 2019-09-23 DIAGNOSIS — M1A09X0 Idiopathic chronic gout, multiple sites, without tophus (tophi): Secondary | ICD-10-CM

## 2019-09-23 DIAGNOSIS — I519 Heart disease, unspecified: Secondary | ICD-10-CM

## 2019-09-23 DIAGNOSIS — Z9861 Coronary angioplasty status: Secondary | ICD-10-CM

## 2019-09-24 ENCOUNTER — Telehealth: Payer: Self-pay | Admitting: Gastroenterology

## 2019-09-24 NOTE — Telephone Encounter (Signed)
Spoke to patients wife who states that patient continues to spit up BRB mostly in the morning upon waking up. She describes the amount to be aprox.1/4 cup in  volume . She is requesting a sooner appointment then already scheduled 10/02/19 with Dr Bryan Lemma. Appointment with Alonza Bogus for 09/25/19 at 10 am. Patient knows to go to the [place] office. Also knows to go to the ER if symptoms worsen.

## 2019-09-25 ENCOUNTER — Encounter: Payer: Self-pay | Admitting: Gastroenterology

## 2019-09-25 ENCOUNTER — Ambulatory Visit: Payer: 59 | Admitting: Gastroenterology

## 2019-09-25 VITALS — BP 118/72 | HR 72 | Ht 68.0 in | Wt 122.0 lb

## 2019-09-25 DIAGNOSIS — Z860100 Personal history of colon polyps, unspecified: Secondary | ICD-10-CM | POA: Insufficient documentation

## 2019-09-25 DIAGNOSIS — Z8774 Personal history of (corrected) congenital malformations of heart and circulatory system: Secondary | ICD-10-CM

## 2019-09-25 DIAGNOSIS — R042 Hemoptysis: Secondary | ICD-10-CM | POA: Diagnosis not present

## 2019-09-25 MED ORDER — NA SULFATE-K SULFATE-MG SULF 17.5-3.13-1.6 GM/177ML PO SOLN
1.0000 | Freq: Once | ORAL | 0 refills | Status: AC
Start: 2019-09-25 — End: 2019-09-25

## 2019-09-25 NOTE — Patient Instructions (Signed)
You have been scheduled for an endoscopy and colonoscopy. Please follow the written instructions given to you at your visit today. Please pick up your prep supplies at the pharmacy within the next 1-3 days. If you use inhalers (even only as needed), please bring them with you on the day of your procedure.  Continue Pantoprazole twice a day for now.
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
## 2019-09-26 ENCOUNTER — Ambulatory Visit: Payer: 59 | Admitting: Cardiovascular Disease

## 2019-09-26 ENCOUNTER — Encounter: Payer: Self-pay | Admitting: Cardiovascular Disease

## 2019-09-26 VITALS — BP 140/82 | HR 81 | Ht 68.0 in | Wt 122.6 lb

## 2019-09-26 DIAGNOSIS — E782 Mixed hyperlipidemia: Secondary | ICD-10-CM

## 2019-09-26 DIAGNOSIS — F17200 Nicotine dependence, unspecified, uncomplicated: Secondary | ICD-10-CM | POA: Diagnosis not present

## 2019-09-26 DIAGNOSIS — I10 Essential (primary) hypertension: Secondary | ICD-10-CM

## 2019-09-26 DIAGNOSIS — I313 Pericardial effusion (noninflammatory): Secondary | ICD-10-CM

## 2019-09-26 DIAGNOSIS — I3139 Other pericardial effusion (noninflammatory): Secondary | ICD-10-CM

## 2019-09-26 DIAGNOSIS — I272 Pulmonary hypertension, unspecified: Secondary | ICD-10-CM

## 2019-09-26 DIAGNOSIS — Z9861 Coronary angioplasty status: Secondary | ICD-10-CM

## 2019-09-26 DIAGNOSIS — R0989 Other specified symptoms and signs involving the circulatory and respiratory systems: Secondary | ICD-10-CM

## 2019-09-26 DIAGNOSIS — I2510 Atherosclerotic heart disease of native coronary artery without angina pectoris: Secondary | ICD-10-CM | POA: Diagnosis not present

## 2019-09-26 NOTE — Assessment & Plan Note (Signed)
History of hyperlipidemia on statin therapy with lipid profile performed 03/07/2019 revealing total cholesterol 111, LDL of 41 HDL 58.

## 2019-09-26 NOTE — Assessment & Plan Note (Signed)
History of pulmonary hypertension (WHO group 1) on sildenafil and OPSUMIT followed by Dr. Haroldine Laws.

## 2019-09-26 NOTE — Assessment & Plan Note (Signed)
History of essential hypertension with blood pressure measured today 140/82.  He is on amlodipine.

## 2019-09-26 NOTE — Assessment & Plan Note (Signed)
History of pericardial effusion probably related to scleroderma status post subxiphoid pericardial window in September 2010 with a second window 02/04/2019.  He has minimal pericardial effusion by echo 05/12/2019.[name]

## 2019-09-26 NOTE — Assessment & Plan Note (Signed)
Discontinue tobacco abuse 02/04/2019

## 2019-09-26 NOTE — Progress Notes (Signed)
Agree with the assessment and plan as outlined by Alonza Bogus, PA-C.  Plan for EGD and colonoscopy as outlined.  If EGD otherwise unrevealing for etiology of bleeding, consider referral to ENT or potentially dental clinic.  Raelie Lohr, DO, [facility]

## 2019-09-26 NOTE — Assessment & Plan Note (Signed)
History of RCA stenting by myself 2010 and subsequent LAD stenting.  Recent right left heart cath performed by Dr. Haroldine Laws 07/01/2018 revealed widely patent stents with normal LV function.  He denies chest pain or shortness of breath.

## 2019-09-26 NOTE — Patient Instructions (Signed)
Medication Instructions:  Your physician recommends that you continue on your current medications as directed. Please refer to the Current Medication list given to you today.  *If you need a refill on your cardiac medications before your next appointment, please call your pharmacy*  Testing/Procedures: Carotid Doppler to be scheduled at Dr. Kennon Holter office - [address]   Follow-Up: At [facility], you and your health needs are our priority.  As part of our continuing mission to provide you with exceptional heart care, we have created designated Provider Care Teams.  These Care Teams include your primary Cardiologist (physician) and Advanced Practice Providers (APPs -  Physician Assistants and Nurse Practitioners) who all work together to provide you with the care you need, when you need it.  We recommend signing up for the patient portal called "MyChart".  Sign up information is provided on this After Visit Summary.  MyChart is used to connect with patients for Virtual Visits (Telemedicine).  Patients are able to view lab/test results, encounter notes, upcoming appointments, etc.  Non-urgent messages can be sent to your provider as well.   To learn more about what you can do with MyChart, go to [email or web address].    Your next appointment:   12 month(s)  The format for your next appointment:   In Person  Provider:   You may see [name], MD or one of the following Advanced Practice Providers on your designated Care Team:    Kerin Ransom, PA-C  [place], [place]  Coletta Memos, [place]    Other Instructions

## 2019-09-26 NOTE — Progress Notes (Signed)
09/26/2019 [name]   [birth year]  [id number]  Primary Physician [name], MD Primary Cardiologist: Lorretta Harp MD [name], [place]  HPI:  [name] is a 59 y.o.  thin-appearing, African American male, father of 88, grandfather to 2 grandchildren who I last sawfor a virtual telemedicine video visit 09/25/2018.[name] He isaccompaniedby his wife today.[name] He has a history of idiopathic pericardial effusion as well as scleroderma, hypertension, hyperlipidemia with low HDL. I catheterized him December 24, 2008, revealing a tight distal RCA stenosis which I ultimately stented with a bare-metal stent on January 07, 2009. He continues to smoke a pack a day. Dr. Merilynn Finland performed a subxiphoid window removing 200 mL of pericardial fluid which resulted in clinical improvement at that time. Followup echo performed November 2011 showed no effusion with normal EF. His other problems include hypertension and hyperlipidemia. He is otherwise asymptomatic and denied any chest pain or shortness of breath. The patient admits to not taking his simvastatin. His last Myoview stress test performed 05/14/12 was nonischemic. He denies chest painbut does have some dyspnea on exertion probably related to COPD.He had a recent chest x-ray performed because of chronic cough suspected bronchitis that was read as "consistent with congestive heart failure.His last 2-D [id number] normal LV systolic function, grade 2 diastolic dysfunction with moderate pericardial effusion without evidence of pericardial tamponade. He did see Angie Duke PA-C in the office 01/15/2018 because of increasing shortness of breath. Did have some mild peripheral edema. 2D echo showed normal LV systolic function with small nonhemodynamically significant pericardial effusion unchanged from last echo. Myoview stress test was normal as well. His hydrochlorthiazide was changed to torsemide. His edema is  improved as has his shortness of breath.  When I saw him in early March he had developed some increasing shortness of breath and dependent edema towards the end of the day. Follow-up echo performed on 06/13/2018 showed an increase in his pericardial effusion, decrease in his LV function down to an EF of 40 to 45% and an increase in his PA pressures up to 100 mmHg. His diuretics were adjusted.  I referred him to Dr. Haroldine Laws for further evaluation.  He ultimately underwent right left heart cath on 07/01/2018 revealing no evidence of tamponade, patent RCA and LAD stents and pulmonary pressures in the 80s.  Medical therapy was recommended.  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  He was admitted several times since then for GI bleeding most recently 5/23 through 5/26.  He ultimately underwent interventional radiology procedure with coil embolization of a third order SMA branch after documentation of cecal extravasation.  He was transfused packed red blood cells.  He feels clinically improved.  He has no peripheral edema and his shortness of breath is better.  He underwent repeat video-assisted thorascopic to be in pericardial window by Dr. [name] 02/10/2019.  His most recent 2D echo performed 05/12/2019 shows only small pericardial effusion.  He is on medications for pulmonary hypertension (WHO group 1) followed by Dr. Haroldine Laws and he feels clinically improved.    Current Meds  Medication Sig  . allopurinol (ZYLOPRIM) 100 MG tablet TAKE 1 TABLET BY MOUTH EVERY DAY  . ALPRAZolam (XANAX) 0.5 MG tablet Take 0.5 mg by mouth daily as needed for anxiety.   [name] amLODipine (NORVASC) 5 MG tablet Take 1 tablet (5 mg total) by mouth daily.  [name] Epoetin Alfa-epbx (RETACRIT IJ) Inject as directed every 30 (thirty) days.  . fluticasone (FLONASE) 50 MCG/ACT  nasal spray Place 2 sprays into both nostrils daily as needed for allergies.   . macitentan (OPSUMIT) 10 MG tablet Take 1 tablet (10 mg total) by mouth daily.  .  pantoprazole (PROTONIX) 40 MG tablet Take 1 tablet (40 mg total) by mouth daily.  . potassium chloride SA (KLOR-CON) 20 MEQ tablet Take 20 mEq by mouth daily.  . sildenafil (REVATIO) 20 MG tablet Take 2 tablets (40 mg total) by mouth 3 (three) times daily.  . simvastatin (ZOCOR) 40 MG tablet Take 1 tablet (40 mg total) by mouth daily. Daily in the morning  . torsemide (DEMADEX) 20 MG tablet Take 1 tablet (20 mg total) by mouth daily.     No Known Allergies  Social History   Socioeconomic History  . Marital status: Married    Spouse name: [name]  . Number of children: 6  . Years of education: 39  . Highest education level: Not on file  Occupational History  . 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 Smoking status: Former Smoker    Packs/day: 1.00    Years: 38.00    Pack years: 38.00    Types: Cigarettes    Start date: 10/21/1971    Quit date: 02/03/2019    Years since quitting: 0.6  . Smokeless tobacco: Never Used  . Tobacco comment: haven't smoked since surery   Substance and Sexual Activity  . Alcohol use: Yes    Comment: rarely   . Drug use: No  . Sexual activity: Yes    Partners: Female  Other Topics Concern  . Not on file  Social History Narrative   Lives with his wife and one daughter.   Social Determinants of Health   Financial Resource Strain:   . Difficulty of Paying Living Expenses:   Food Insecurity:   . Worried About Charity fundraiser in the Last Year:   . Arboriculturist in the Last Year:   Transportation Needs:   . Film/video editor (Medical):   [name] Lack of Transportation (Non-Medical):   Physical Activity:   . Days of Exercise per Week:   . Minutes of Exercise per Session:   Stress:   . Feeling of Stress :   Social Connections:   . Frequency of Communication with Friends and Family:   . Frequency of Social Gatherings with Friends and Family:   . Attends Religious Services:   . Active Member of Clubs or Organizations:   .  Attends Archivist Meetings:   [name] Marital Status:   Intimate Partner Violence:   . Fear of Current or Ex-Partner:   . Emotionally Abused:   [name] Physically Abused:   . Sexually Abused:      Review of Systems: General: negative for chills, fever, night sweats or weight changes.  Cardiovascular: negative for chest pain, dyspnea on exertion, edema, orthopnea, palpitations, paroxysmal nocturnal dyspnea or shortness of breath Dermatological: negative for rash Respiratory: negative for cough or wheezing Urologic: negative for hematuria Abdominal: negative for nausea, vomiting, diarrhea, bright red blood per rectum, melena, or hematemesis Neurologic: negative for visual changes, syncope, or dizziness All other systems reviewed and are otherwise negative except as noted above.    Blood pressure 140/82, pulse 81, height _0  (1.727 m), weight 122 lb 9.6 oz (55.6 kg), SpO2 99 %.  General appearance: alert and no distress Neck: no adenopathy, no carotid bruit, no JVD, supple, symmetrical, trachea midline and thyroid not enlarged, symmetric, no tenderness/mass/nodules Lungs:  clear to auscultation bilaterally Heart: regular rate and rhythm, S1, S2 normal, no murmur, click, rub or gallop Extremities: extremities normal, atraumatic, no cyanosis or edema Pulses: 2+ and symmetric Skin: Skin color, texture, turgor normal. No rashes or lesions Neurologic: Alert and oriented X 3, normal strength and tone. Normal symmetric reflexes. Normal coordination and gait  EKG sinus rhythm 81 with low limb voltage and nonspecific ST and T wave changes.  I personally reviewed this EKG.  ASSESSMENT AND PLAN:   Smoker Discontinue tobacco abuse 02/04/2019  CAD S/P percutaneous coronary angioplasty History of RCA stenting by myself 2010 and subsequent LAD stenting.  Recent right left heart cath performed by Dr. Haroldine Laws 07/01/2018 revealed widely patent stents with normal LV function.  He denies chest pain  or shortness of breath.  Hyperlipidemia History of hyperlipidemia on statin therapy with lipid profile performed 03/07/2019 revealing total cholesterol 111, LDL of 41 HDL 58.  Pericardial effusion History of pericardial effusion probably related to scleroderma status post subxiphoid pericardial window in September 2010 with a second window 02/04/2019.  He has minimal pericardial effusion by echo 05/12/2019.[name]  Essential hypertension History of essential hypertension with blood pressure measured today 140/82.  He is on amlodipine.  Pulmonary hypertension (HCC) History of pulmonary hypertension (WHO group 1) on sildenafil and OPSUMIT followed by Dr. Haroldine Laws.       Lorretta Harp MD FACP,FACC,FAHA, [facility] 09/26/2019 2:44 PM

## 2019-09-28 ENCOUNTER — Emergency Department (HOSPITAL_COMMUNITY): Payer: 59

## 2019-09-28 ENCOUNTER — Encounter (HOSPITAL_COMMUNITY): Payer: Self-pay

## 2019-09-28 ENCOUNTER — Other Ambulatory Visit: Payer: Self-pay

## 2019-09-28 ENCOUNTER — Telehealth: Payer: Self-pay | Admitting: Physician Assistant

## 2019-09-28 ENCOUNTER — Inpatient Hospital Stay (HOSPITAL_COMMUNITY)
Admission: EM | Admit: 2019-09-28 | Discharge: 2019-10-12 | DRG: 335 | Disposition: A | Discharge status: Home / Self Care | Payer: 59 | Attending: Family Medicine | Admitting: Family Medicine

## 2019-09-28 DIAGNOSIS — E876 Hypokalemia: Secondary | ICD-10-CM | POA: Diagnosis present

## 2019-09-28 DIAGNOSIS — K5900 Constipation, unspecified: Secondary | ICD-10-CM | POA: Diagnosis present

## 2019-09-28 DIAGNOSIS — Z681 Body mass index (BMI) 19 or less, adult: Secondary | ICD-10-CM

## 2019-09-28 DIAGNOSIS — I5022 Chronic systolic (congestive) heart failure: Secondary | ICD-10-CM | POA: Diagnosis not present

## 2019-09-28 DIAGNOSIS — Z87891 Personal history of nicotine dependence: Secondary | ICD-10-CM

## 2019-09-28 DIAGNOSIS — D509 Iron deficiency anemia, unspecified: Secondary | ICD-10-CM | POA: Diagnosis present

## 2019-09-28 DIAGNOSIS — E861 Hypovolemia: Secondary | ICD-10-CM | POA: Diagnosis present

## 2019-09-28 DIAGNOSIS — R5381 Other malaise: Secondary | ICD-10-CM | POA: Diagnosis present

## 2019-09-28 DIAGNOSIS — E782 Mixed hyperlipidemia: Secondary | ICD-10-CM | POA: Diagnosis not present

## 2019-09-28 DIAGNOSIS — N184 Chronic kidney disease, stage 4 (severe): Secondary | ICD-10-CM | POA: Diagnosis present

## 2019-09-28 DIAGNOSIS — K56609 Unspecified intestinal obstruction, unspecified as to partial versus complete obstruction: Secondary | ICD-10-CM

## 2019-09-28 DIAGNOSIS — Z20822 Contact with and (suspected) exposure to covid-19: Secondary | ICD-10-CM | POA: Diagnosis present

## 2019-09-28 DIAGNOSIS — R112 Nausea with vomiting, unspecified: Secondary | ICD-10-CM | POA: Diagnosis present

## 2019-09-28 DIAGNOSIS — R05 Cough: Secondary | ICD-10-CM | POA: Diagnosis not present

## 2019-09-28 DIAGNOSIS — E43 Unspecified severe protein-calorie malnutrition: Secondary | ICD-10-CM | POA: Diagnosis present

## 2019-09-28 DIAGNOSIS — R7989 Other specified abnormal findings of blood chemistry: Secondary | ICD-10-CM | POA: Diagnosis present

## 2019-09-28 DIAGNOSIS — Z9861 Coronary angioplasty status: Secondary | ICD-10-CM

## 2019-09-28 DIAGNOSIS — R509 Fever, unspecified: Secondary | ICD-10-CM | POA: Diagnosis not present

## 2019-09-28 DIAGNOSIS — K8020 Calculus of gallbladder without cholecystitis without obstruction: Secondary | ICD-10-CM | POA: Diagnosis present

## 2019-09-28 DIAGNOSIS — N1832 Chronic kidney disease, stage 3b: Secondary | ICD-10-CM | POA: Diagnosis not present

## 2019-09-28 DIAGNOSIS — Z4659 Encounter for fitting and adjustment of other gastrointestinal appliance and device: Secondary | ICD-10-CM

## 2019-09-28 DIAGNOSIS — Z955 Presence of coronary angioplasty implant and graft: Secondary | ICD-10-CM

## 2019-09-28 DIAGNOSIS — Z832 Family history of diseases of the blood and blood-forming organs and certain disorders involving the immune mechanism: Secondary | ICD-10-CM

## 2019-09-28 DIAGNOSIS — K317 Polyp of stomach and duodenum: Secondary | ICD-10-CM | POA: Diagnosis present

## 2019-09-28 DIAGNOSIS — E870 Hyperosmolality and hypernatremia: Secondary | ICD-10-CM | POA: Diagnosis not present

## 2019-09-28 DIAGNOSIS — I493 Ventricular premature depolarization: Secondary | ICD-10-CM | POA: Diagnosis present

## 2019-09-28 DIAGNOSIS — E872 Acidosis: Secondary | ICD-10-CM | POA: Diagnosis not present

## 2019-09-28 DIAGNOSIS — E46 Unspecified protein-calorie malnutrition: Secondary | ICD-10-CM

## 2019-09-28 DIAGNOSIS — R933 Abnormal findings on diagnostic imaging of other parts of digestive tract: Secondary | ICD-10-CM | POA: Diagnosis not present

## 2019-09-28 DIAGNOSIS — I252 Old myocardial infarction: Secondary | ICD-10-CM

## 2019-09-28 DIAGNOSIS — E785 Hyperlipidemia, unspecified: Secondary | ICD-10-CM | POA: Diagnosis present

## 2019-09-28 DIAGNOSIS — Z841 Family history of disorders of kidney and ureter: Secondary | ICD-10-CM

## 2019-09-28 DIAGNOSIS — D631 Anemia in chronic kidney disease: Secondary | ICD-10-CM | POA: Diagnosis present

## 2019-09-28 DIAGNOSIS — Z01810 Encounter for preprocedural cardiovascular examination: Secondary | ICD-10-CM | POA: Diagnosis not present

## 2019-09-28 DIAGNOSIS — E871 Hypo-osmolality and hyponatremia: Secondary | ICD-10-CM | POA: Diagnosis not present

## 2019-09-28 DIAGNOSIS — N179 Acute kidney failure, unspecified: Secondary | ICD-10-CM | POA: Diagnosis not present

## 2019-09-28 DIAGNOSIS — R64 Cachexia: Secondary | ICD-10-CM | POA: Diagnosis not present

## 2019-09-28 DIAGNOSIS — N022 Recurrent and persistent hematuria with diffuse membranous glomerulonephritis: Secondary | ICD-10-CM | POA: Diagnosis present

## 2019-09-28 DIAGNOSIS — F419 Anxiety disorder, unspecified: Secondary | ICD-10-CM | POA: Diagnosis present

## 2019-09-28 DIAGNOSIS — K5520 Angiodysplasia of colon without hemorrhage: Secondary | ICD-10-CM | POA: Diagnosis present

## 2019-09-28 DIAGNOSIS — I2510 Atherosclerotic heart disease of native coronary artery without angina pectoris: Secondary | ICD-10-CM | POA: Diagnosis present

## 2019-09-28 DIAGNOSIS — D72829 Elevated white blood cell count, unspecified: Secondary | ICD-10-CM | POA: Diagnosis not present

## 2019-09-28 DIAGNOSIS — I272 Pulmonary hypertension, unspecified: Secondary | ICD-10-CM | POA: Diagnosis present

## 2019-09-28 DIAGNOSIS — N183 Chronic kidney disease, stage 3 unspecified: Secondary | ICD-10-CM | POA: Diagnosis not present

## 2019-09-28 DIAGNOSIS — I5032 Chronic diastolic (congestive) heart failure: Secondary | ICD-10-CM | POA: Diagnosis present

## 2019-09-28 DIAGNOSIS — E039 Hypothyroidism, unspecified: Secondary | ICD-10-CM | POA: Diagnosis present

## 2019-09-28 DIAGNOSIS — Z8719 Personal history of other diseases of the digestive system: Secondary | ICD-10-CM

## 2019-09-28 DIAGNOSIS — K5652 Intestinal adhesions [bands] with complete obstruction: Secondary | ICD-10-CM | POA: Diagnosis present

## 2019-09-28 DIAGNOSIS — I130 Hypertensive heart and chronic kidney disease with heart failure and stage 1 through stage 4 chronic kidney disease, or unspecified chronic kidney disease: Secondary | ICD-10-CM | POA: Diagnosis present

## 2019-09-28 DIAGNOSIS — R1110 Vomiting, unspecified: Secondary | ICD-10-CM

## 2019-09-28 DIAGNOSIS — D649 Anemia, unspecified: Secondary | ICD-10-CM | POA: Diagnosis not present

## 2019-09-28 DIAGNOSIS — I10 Essential (primary) hypertension: Secondary | ICD-10-CM | POA: Diagnosis present

## 2019-09-28 DIAGNOSIS — R935 Abnormal findings on diagnostic imaging of other abdominal regions, including retroperitoneum: Secondary | ICD-10-CM | POA: Diagnosis not present

## 2019-09-28 DIAGNOSIS — R809 Proteinuria, unspecified: Secondary | ICD-10-CM | POA: Diagnosis not present

## 2019-09-28 DIAGNOSIS — Z0189 Encounter for other specified special examinations: Secondary | ICD-10-CM

## 2019-09-28 DIAGNOSIS — E162 Hypoglycemia, unspecified: Secondary | ICD-10-CM | POA: Diagnosis not present

## 2019-09-28 DIAGNOSIS — K2970 Gastritis, unspecified, without bleeding: Secondary | ICD-10-CM | POA: Diagnosis present

## 2019-09-28 DIAGNOSIS — M349 Systemic sclerosis, unspecified: Secondary | ICD-10-CM | POA: Diagnosis present

## 2019-09-28 DIAGNOSIS — I7300 Raynaud's syndrome without gangrene: Secondary | ICD-10-CM | POA: Diagnosis present

## 2019-09-28 DIAGNOSIS — M109 Gout, unspecified: Secondary | ICD-10-CM | POA: Diagnosis present

## 2019-09-28 DIAGNOSIS — E860 Dehydration: Secondary | ICD-10-CM | POA: Diagnosis present

## 2019-09-28 DIAGNOSIS — Z79899 Other long term (current) drug therapy: Secondary | ICD-10-CM

## 2019-09-28 DIAGNOSIS — Z86010 Personal history of colonic polyps: Secondary | ICD-10-CM

## 2019-09-28 LAB — COMPREHENSIVE METABOLIC PANEL
ALT: 15 U/L (ref 0–44)
AST: 22 U/L (ref 15–41)
Albumin: 4.2 g/dL (ref 3.5–5.0)
Alkaline Phosphatase: 77 U/L (ref 38–126)
Anion gap: 16 — ABNORMAL HIGH (ref 5–15)
BUN: 51 mg/dL — ABNORMAL HIGH (ref 6–20)
CO2: 21 mmol/L — ABNORMAL LOW (ref 22–32)
Calcium: 8.5 mg/dL — ABNORMAL LOW (ref 8.9–10.3)
Chloride: 108 mmol/L (ref 98–111)
Creatinine, Ser: 3.05 mg/dL — ABNORMAL HIGH (ref 0.61–1.24)
GFR calc Af Amer: 25 mL/min — ABNORMAL LOW (ref >60)
GFR calc non Af Amer: 21 mL/min — ABNORMAL LOW (ref >60)
Glucose, Bld: 122 mg/dL — ABNORMAL HIGH (ref 70–99)
Potassium: 4 mmol/L (ref 3.5–5.1)
Sodium: 145 mmol/L (ref 135–145)
Total Bilirubin: 0.6 mg/dL (ref 0.3–1.2)
Total Protein: 8 g/dL (ref 6.5–8.1)

## 2019-09-28 LAB — CBC
HCT: 32.7 % — ABNORMAL LOW (ref 39.0–52.0)
Hemoglobin: 9.9 g/dL — ABNORMAL LOW (ref 13.0–17.0)
MCH: 30.8 pg (ref 26.0–34.0)
MCHC: 30.3 g/dL (ref 30.0–36.0)
MCV: 101.9 fL — ABNORMAL HIGH (ref 80.0–100.0)
Platelets: 193 10*3/uL (ref 150–400)
RBC: 3.21 MIL/uL — ABNORMAL LOW (ref 4.22–5.81)
RDW: 16.1 % — ABNORMAL HIGH (ref 11.5–15.5)
WBC: 5.6 10*3/uL (ref 4.0–10.5)
nRBC: 0 % (ref 0.0–0.2)

## 2019-09-28 LAB — SARS CORONAVIRUS 2 BY RT PCR (HOSPITAL ORDER, PERFORMED IN ~~LOC~~ HOSPITAL LAB): SARS Coronavirus 2: NEGATIVE

## 2019-09-28 LAB — LIPASE, BLOOD: Lipase: 40 U/L (ref 11–51)

## 2019-09-28 LAB — BRAIN NATRIURETIC PEPTIDE: B Natriuretic Peptide: 184.7 pg/mL — ABNORMAL HIGH (ref 0.0–100.0)

## 2019-09-28 IMAGING — CT CT ABD-PELV W/O CM
2 of 4 series · 16 of 46 positions shown, 18 images · non-contrast
Comparison: [DATE]

CLINICAL DATA: Vomiting for 2 days

EXAM:
CT ABDOMEN AND PELVIS WITHOUT CONTRAST
TECHNIQUE: Multidetector CT imaging of the abdomen and pelvis was performed
following the standard protocol without IV contrast.

[Series 3: ap without · axial · non-contrast · 0.61mm/px · z∈[-355,+80]mm · 13 of 101 slices shown, 15 images]
[im 7/101  soft-tissue]
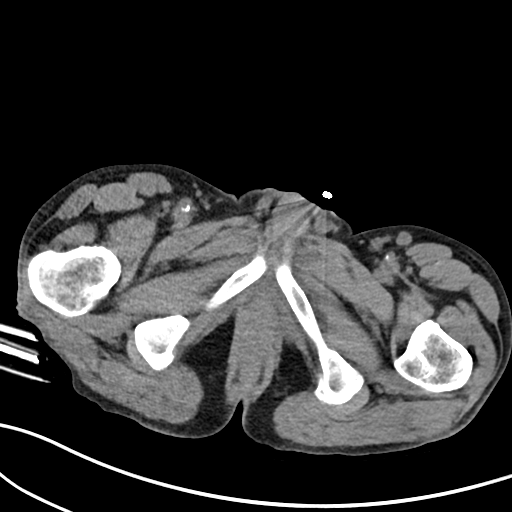
[im 7/101  bone]
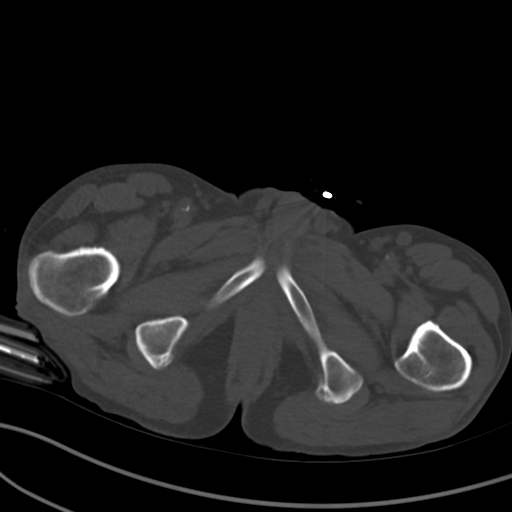
[im 14/101  soft-tissue]
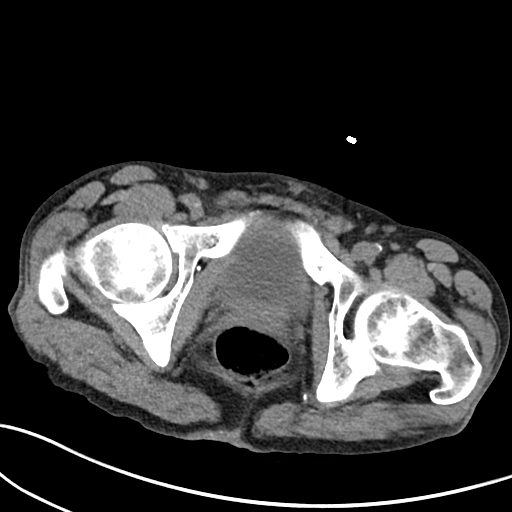
[im 21/101  soft-tissue]
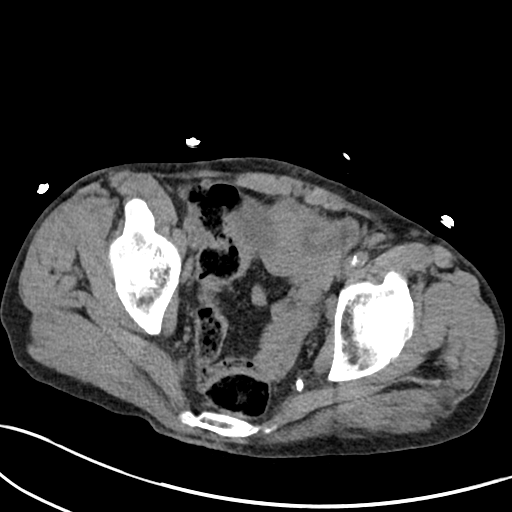
[im 27/101  soft-tissue]
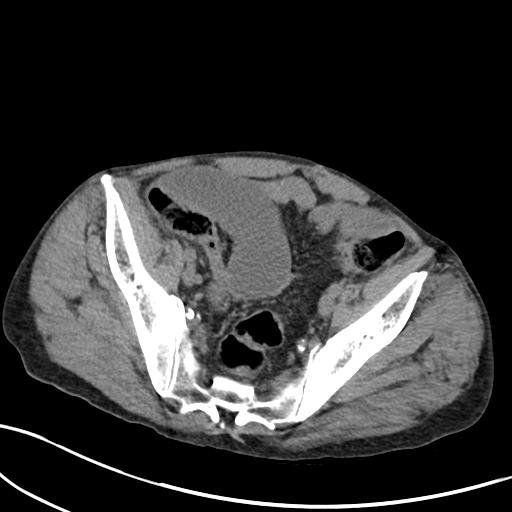
[im 34/101  soft-tissue]
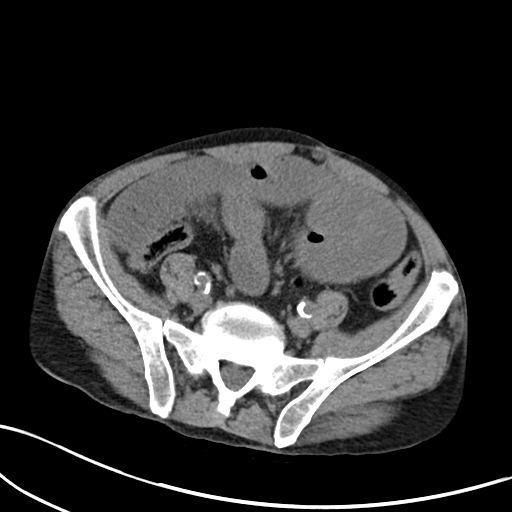
[im 41/101  soft-tissue]
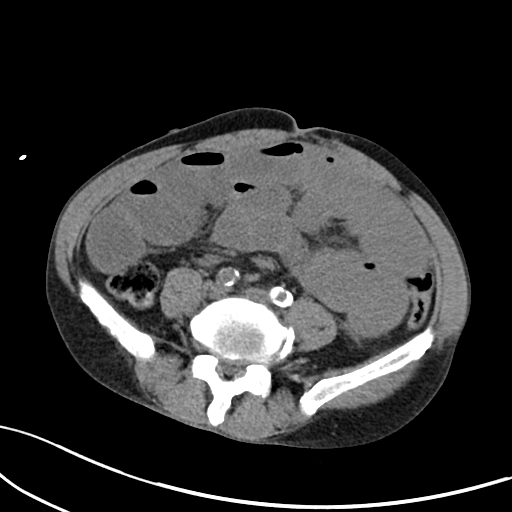
[im 54/101  soft-tissue]
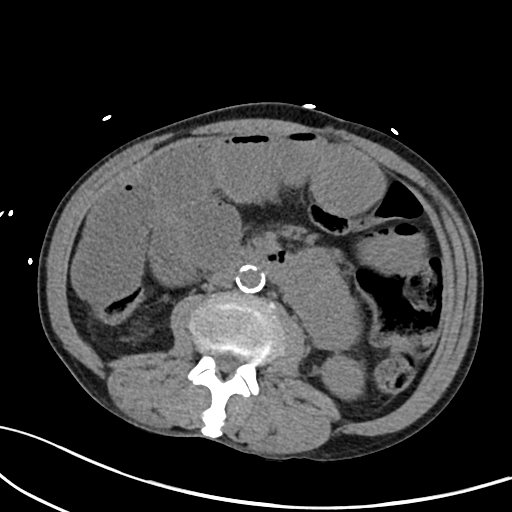
[im 61/101  soft-tissue]
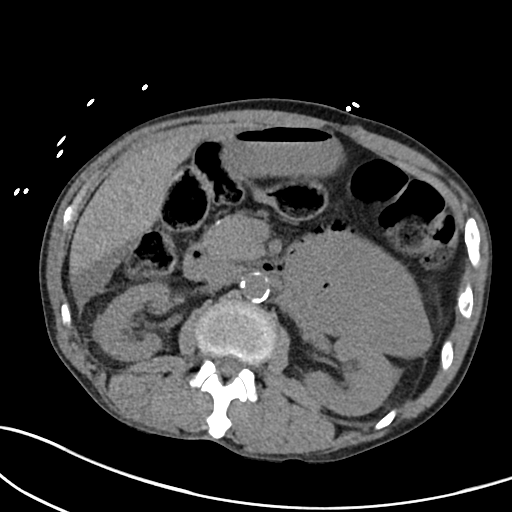
[im 67/101  soft-tissue]
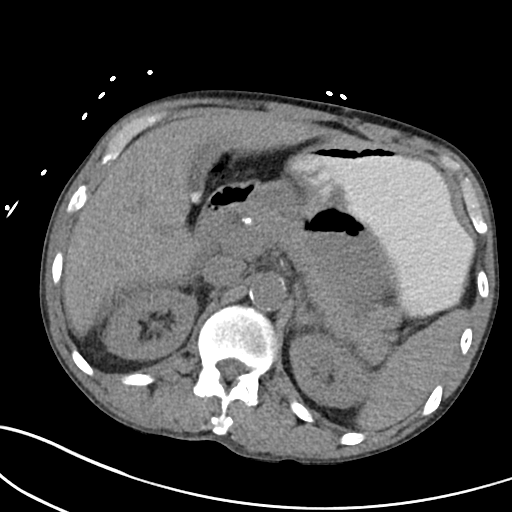
[im 67/101  bone]
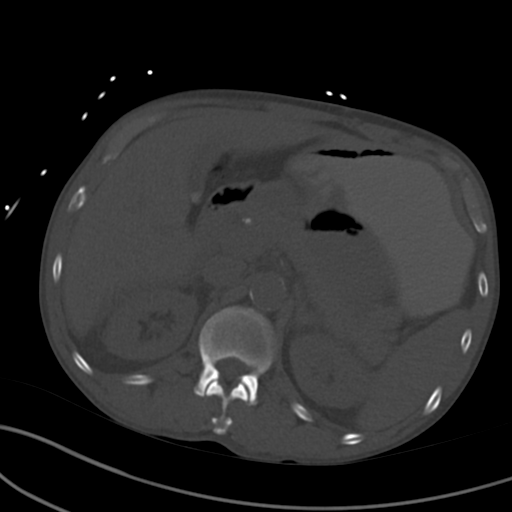
[im 74/101  soft-tissue]
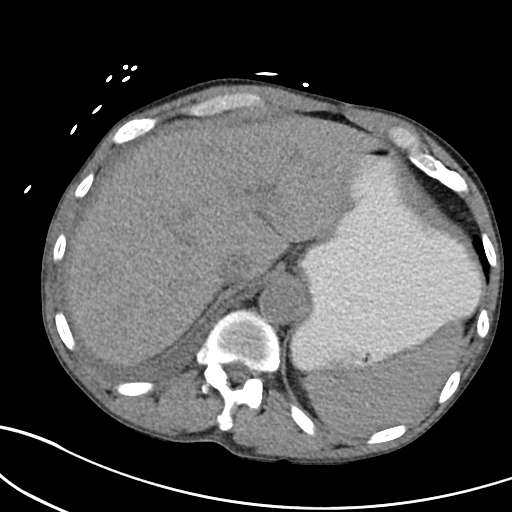
[im 81/101  soft-tissue]
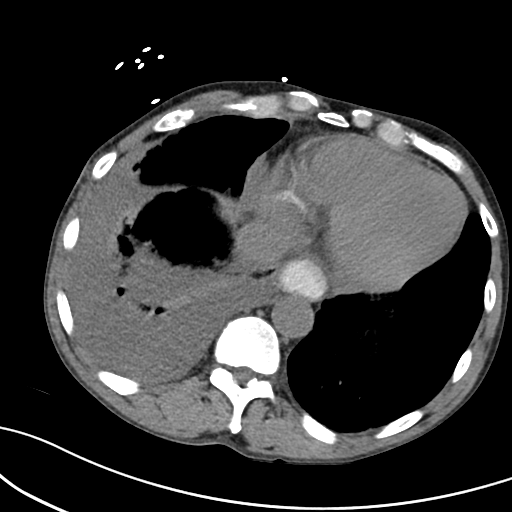
[im 87/101  soft-tissue]
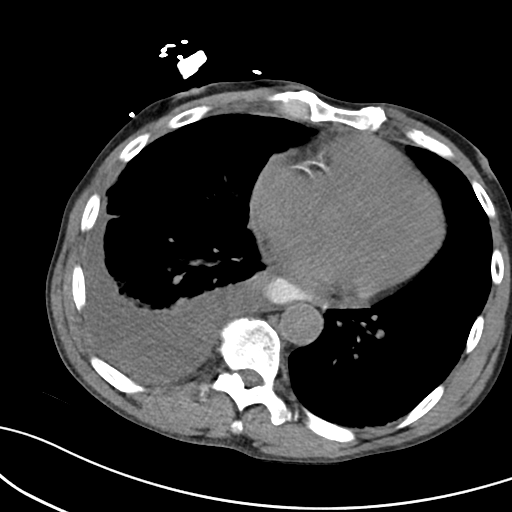
[im 94/101  soft-tissue]
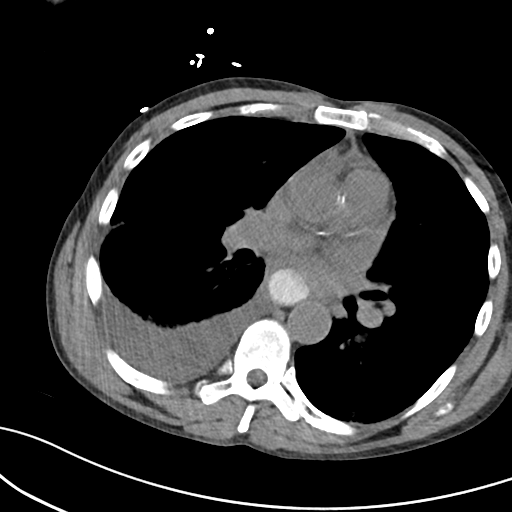

[Series 6: cor · coronal · 0.74mm/px · 3 of 80 slices shown]
[im 27/80  soft-tissue]
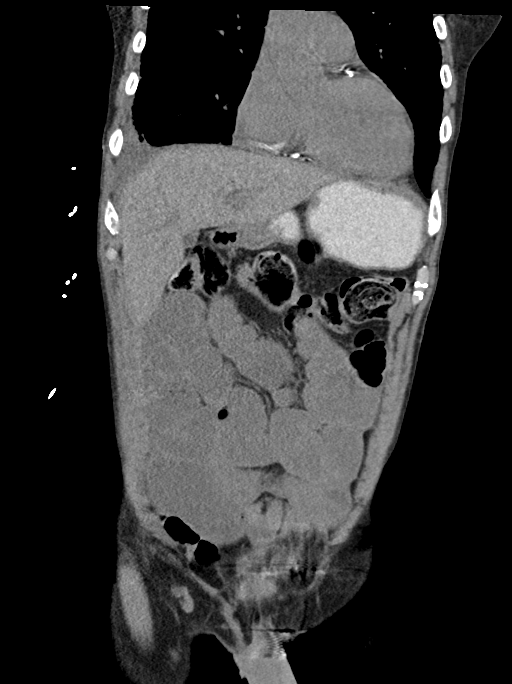
[im 36/80  soft-tissue]
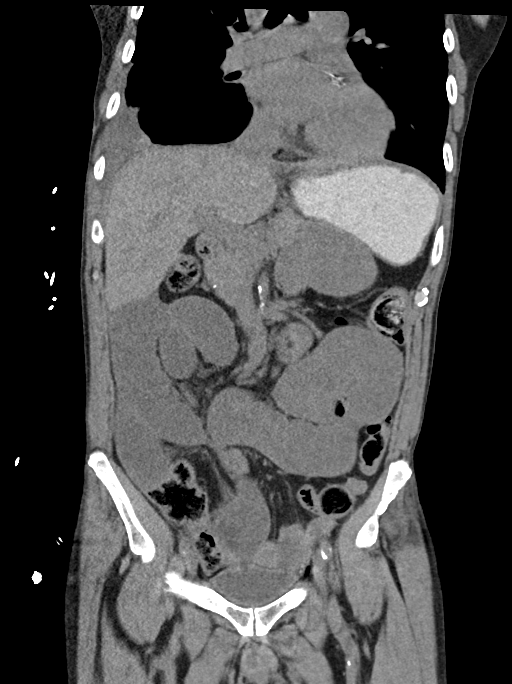
[im 44/80  soft-tissue]
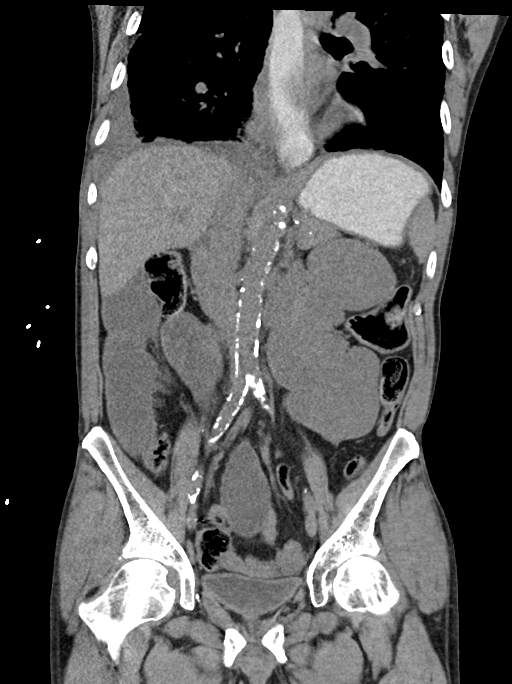

[16 of 46 positions shown; findings below may reference images not displayed]

FINDINGS: Lower chest: Chronic small right pleural effusion unchanged.
Bibasilar scarring and fibrosis again noted. Stable atherosclerosis
of the coronary vasculature.

Hepatobiliary: Calcified gallstone identified without cholecystitis.
No focal liver abnormalities.

Pancreas: Unremarkable. No pancreatic ductal dilatation or
surrounding inflammatory changes.

Spleen: Normal in size without focal abnormality.

Adrenals/Urinary Tract: No urinary tract calculi or obstructive
uropathy within either kidney. Bladder is unremarkable. Adrenals are
normal.

Stomach/Bowel: Multiple dilated loops of proximal small bowel to the
level of the distal jejunum, measuring up to 3.8 cm in diameter.
There are diffuse gas fluid level seen within the proximal to mid
jejunum, consistent small bowel obstruction. The exact point of
obstruction is difficult to identify, but may be within the central
pelvis on image 68 at a site of jejunal wall thickening.

There is a normal appendix right lower quadrant.

Vascular/Lymphatic: Aortic atherosclerosis. No enlarged abdominal or
pelvic lymph nodes.

Reproductive: Prostate is unremarkable.

Other: There is trace free fluid in the sub hepatic region. No free
intraperitoneal gas.

Musculoskeletal: No acute or destructive bony lesions. Reconstructed
images demonstrate no additional findings.
IMPRESSION: 1. Small-bowel obstruction, with the exact point of obstruction
difficult to identify, but may be within the central pelvis at a
site of jejunal wall thickening.
2. Cholelithiasis without cholecystitis.
3. Chronic small right pleural effusion.
4. Aortic Atherosclerosis ([EL]-[EL]).

## 2019-09-28 IMAGING — DX DG ABDOMEN 1V
1 series · 1 of 1 positions shown · non-contrast
Comparison: None.

CLINICAL DATA: Status post nasogastric tube placement.

EXAM:
ABDOMEN - 1 VIEW

[abdomen kub]
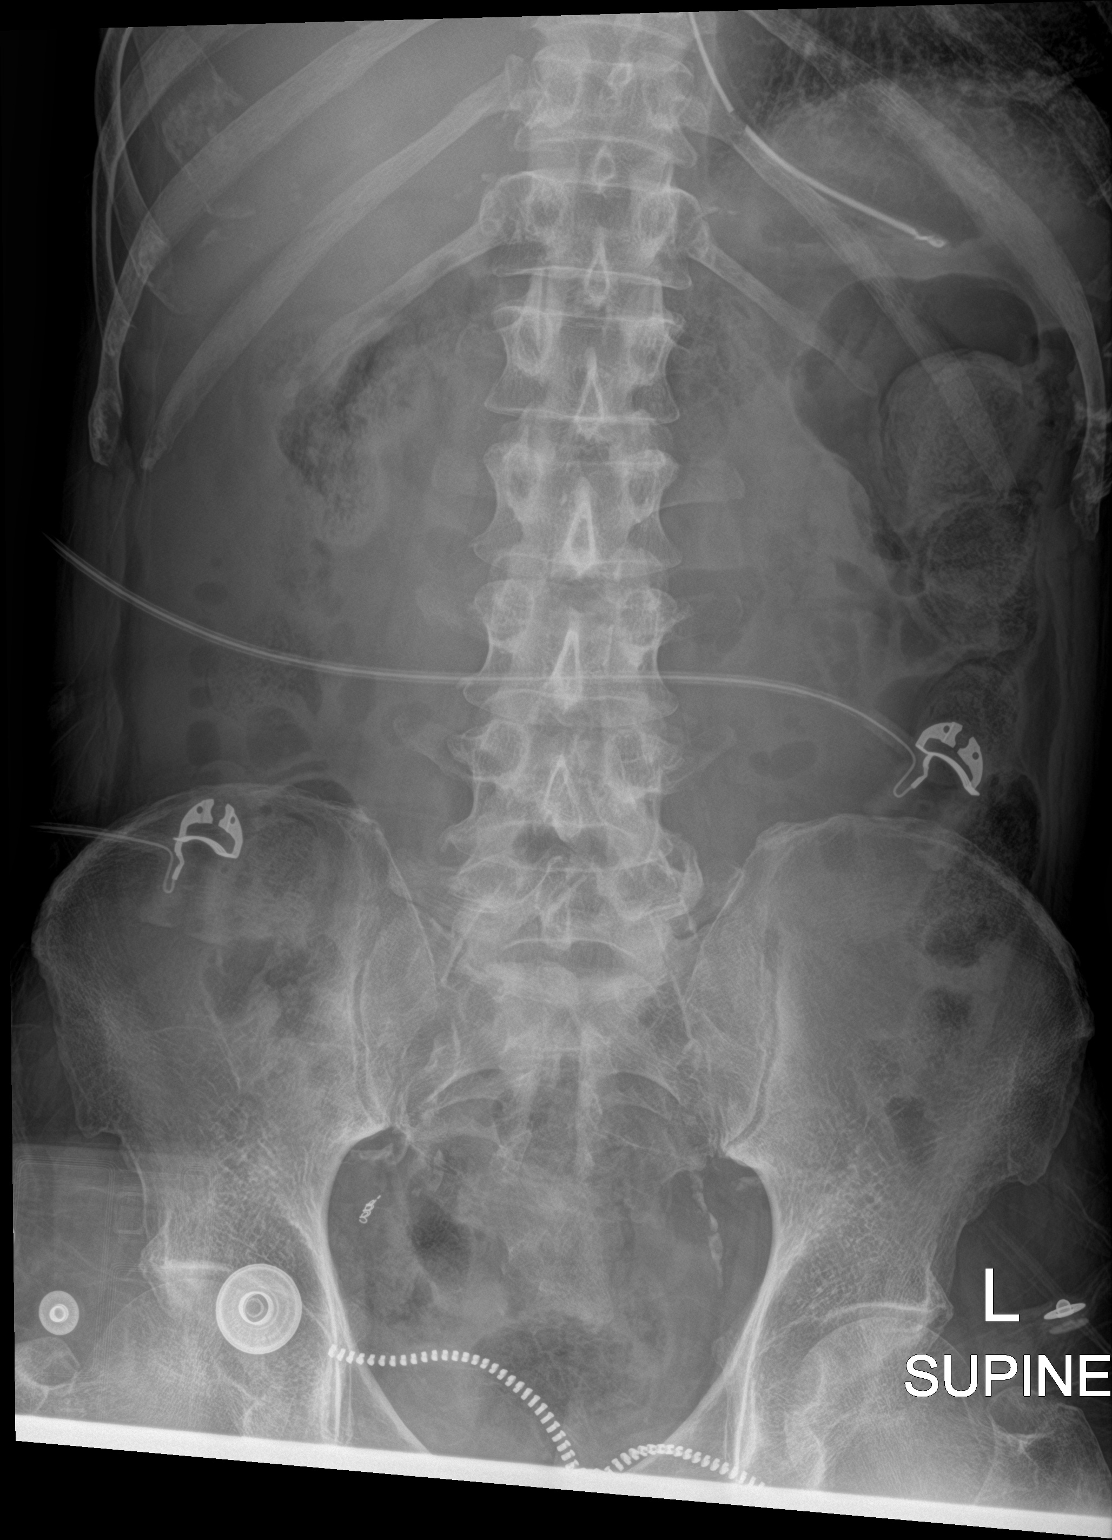

[1 of 1 positions shown; findings below may reference images not displayed]

FINDINGS: A nasogastric tube is seen with its distal tip overlying the body of
the stomach. There is opacification of the visualized portion of the
right lung base. The bowel gas pattern is normal. No radio-opaque
calculi or other significant radiographic abnormality are seen.
Radiopaque surgical coils are seen overlying the pelvis on the
right.
IMPRESSION: Nasogastric tube positioning, as described above.

## 2019-09-28 MED ORDER — ONDANSETRON HCL 4 MG/2ML IJ SOLN
4.0000 mg | Freq: Once | INTRAMUSCULAR | Status: AC
  Administered 2019-09-28: 4 mg via INTRAVENOUS
  Filled 2019-09-28: qty 2

## 2019-09-28 MED ORDER — MORPHINE SULFATE (PF) 2 MG/ML IV SOLN: 2.0000 mg | INTRAVENOUS | Status: DC | PRN

## 2019-09-28 MED ORDER — ONDANSETRON HCL 4 MG/2ML IJ SOLN
4.0000 mg | Freq: Four times a day (QID) | INTRAMUSCULAR | Status: DC | PRN
  Administered 2019-09-29 – 2019-10-07 (×12): 4 mg via INTRAVENOUS
  Filled 2019-09-28 (×12): qty 2

## 2019-09-28 MED ORDER — SODIUM CHLORIDE 0.9% FLUSH: 3.0000 mL | Freq: Once | INTRAVENOUS | Status: DC

## 2019-09-28 MED ORDER — SODIUM CHLORIDE 0.9 % IV BOLUS
500.0000 mL | Freq: Once | INTRAVENOUS | Status: AC
  Administered 2019-09-28: 500 mL via INTRAVENOUS

## 2019-09-28 MED ORDER — SODIUM CHLORIDE 0.9 % IV SOLN: INTRAVENOUS | Status: DC

## 2019-09-28 MED ORDER — IOHEXOL 9 MG/ML PO SOLN
500.0000 mL | ORAL | Status: AC
  Administered 2019-09-28: 500 mL via ORAL

## 2019-09-28 MED ORDER — HEPARIN SODIUM (PORCINE) 5000 UNIT/ML IJ SOLN
5000.0000 [IU] | Freq: Three times a day (TID) | INTRAMUSCULAR | Status: DC
  Administered 2019-09-28 – 2019-10-12 (×41): 5000 [IU] via SUBCUTANEOUS
  Filled 2019-09-28 (×40): qty 1

## 2019-09-28 MED ORDER — ONDANSETRON HCL 4 MG PO TABS: 4.0000 mg | ORAL_TABLET | Freq: Four times a day (QID) | ORAL | Status: DC | PRN

## 2019-09-28 MED ORDER — LABETALOL HCL 5 MG/ML IV SOLN: 5.0000 mg | INTRAVENOUS | Status: DC | PRN
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
## 2019-09-28 NOTE — Telephone Encounter (Signed)
Patient's wife called.  Since about 9:00 last night he has been having frequent bilious emesis, no coffee grounds, no blood.  Feels a bit weak.  No shortness of breath or chest pain.  No significant abdominal pain. Wife made contact with GI coverage to find out what to do.  Given that this patient has other medical issues, do not feel comfortable calling in a prescription for antiemetics.  Needs to be seen at urgent care or ED for lab work, especially renal function and chemistries to make sure that he is stable enough to be treated as an outpatient. The wife, and the patient in the background, understood this and plan to proceed to local ED.  They seem most interested in going to the [facility] ED and I said that was fine but he also could come to [place] long or [facility] hospital, it was their choice.

## 2019-09-28 NOTE — ED Triage Notes (Signed)
Patient complains of vomiting x 14 hours. Denies abdominal pain, no diarrhea. Started after eating hot skins yesterday

## 2019-09-29 ENCOUNTER — Inpatient Hospital Stay (HOSPITAL_COMMUNITY): Payer: 59

## 2019-09-29 LAB — COMPREHENSIVE METABOLIC PANEL
ALT: 12 U/L (ref 0–44)
AST: 16 U/L (ref 15–41)
Albumin: 3.7 g/dL (ref 3.5–5.0)
Alkaline Phosphatase: 66 U/L (ref 38–126)
Anion gap: 15 (ref 5–15)
BUN: 58 mg/dL — ABNORMAL HIGH (ref 6–20)
CO2: 20 mmol/L — ABNORMAL LOW (ref 22–32)
Calcium: 7.9 mg/dL — ABNORMAL LOW (ref 8.9–10.3)
Chloride: 110 mmol/L (ref 98–111)
Creatinine, Ser: 3.53 mg/dL — ABNORMAL HIGH (ref 0.61–1.24)
GFR calc Af Amer: 21 mL/min — ABNORMAL LOW (ref >60)
GFR calc non Af Amer: 18 mL/min — ABNORMAL LOW (ref >60)
Glucose, Bld: 101 mg/dL — ABNORMAL HIGH (ref 70–99)
Potassium: 4 mmol/L (ref 3.5–5.1)
Sodium: 145 mmol/L (ref 135–145)
Total Bilirubin: 0.4 mg/dL (ref 0.3–1.2)
Total Protein: 7 g/dL (ref 6.5–8.1)

## 2019-09-29 LAB — CBC
HCT: 30.5 % — ABNORMAL LOW (ref 39.0–52.0)
Hemoglobin: 9.2 g/dL — ABNORMAL LOW (ref 13.0–17.0)
MCH: 30.2 pg (ref 26.0–34.0)
MCHC: 30.2 g/dL (ref 30.0–36.0)
MCV: 100 fL (ref 80.0–100.0)
Platelets: 166 10*3/uL (ref 150–400)
RBC: 3.05 MIL/uL — ABNORMAL LOW (ref 4.22–5.81)
RDW: 16.1 % — ABNORMAL HIGH (ref 11.5–15.5)
WBC: 4.7 10*3/uL (ref 4.0–10.5)
nRBC: 0 % (ref 0.0–0.2)

## 2019-09-29 LAB — MAGNESIUM: Magnesium: 1 mg/dL — ABNORMAL LOW (ref 1.7–2.4)

## 2019-09-29 LAB — HIV ANTIBODY (ROUTINE TESTING W REFLEX): HIV Screen 4th Generation wRfx: NONREACTIVE

## 2019-09-29 IMAGING — DX DG ABD PORTABLE 1V
1 series · 1 of 1 positions shown · non-contrast
Comparison: Radiograph in CT yesterday.

CLINICAL DATA: NG tube placement.

EXAM:
PORTABLE ABDOMEN - 1 VIEW

[abdomen]
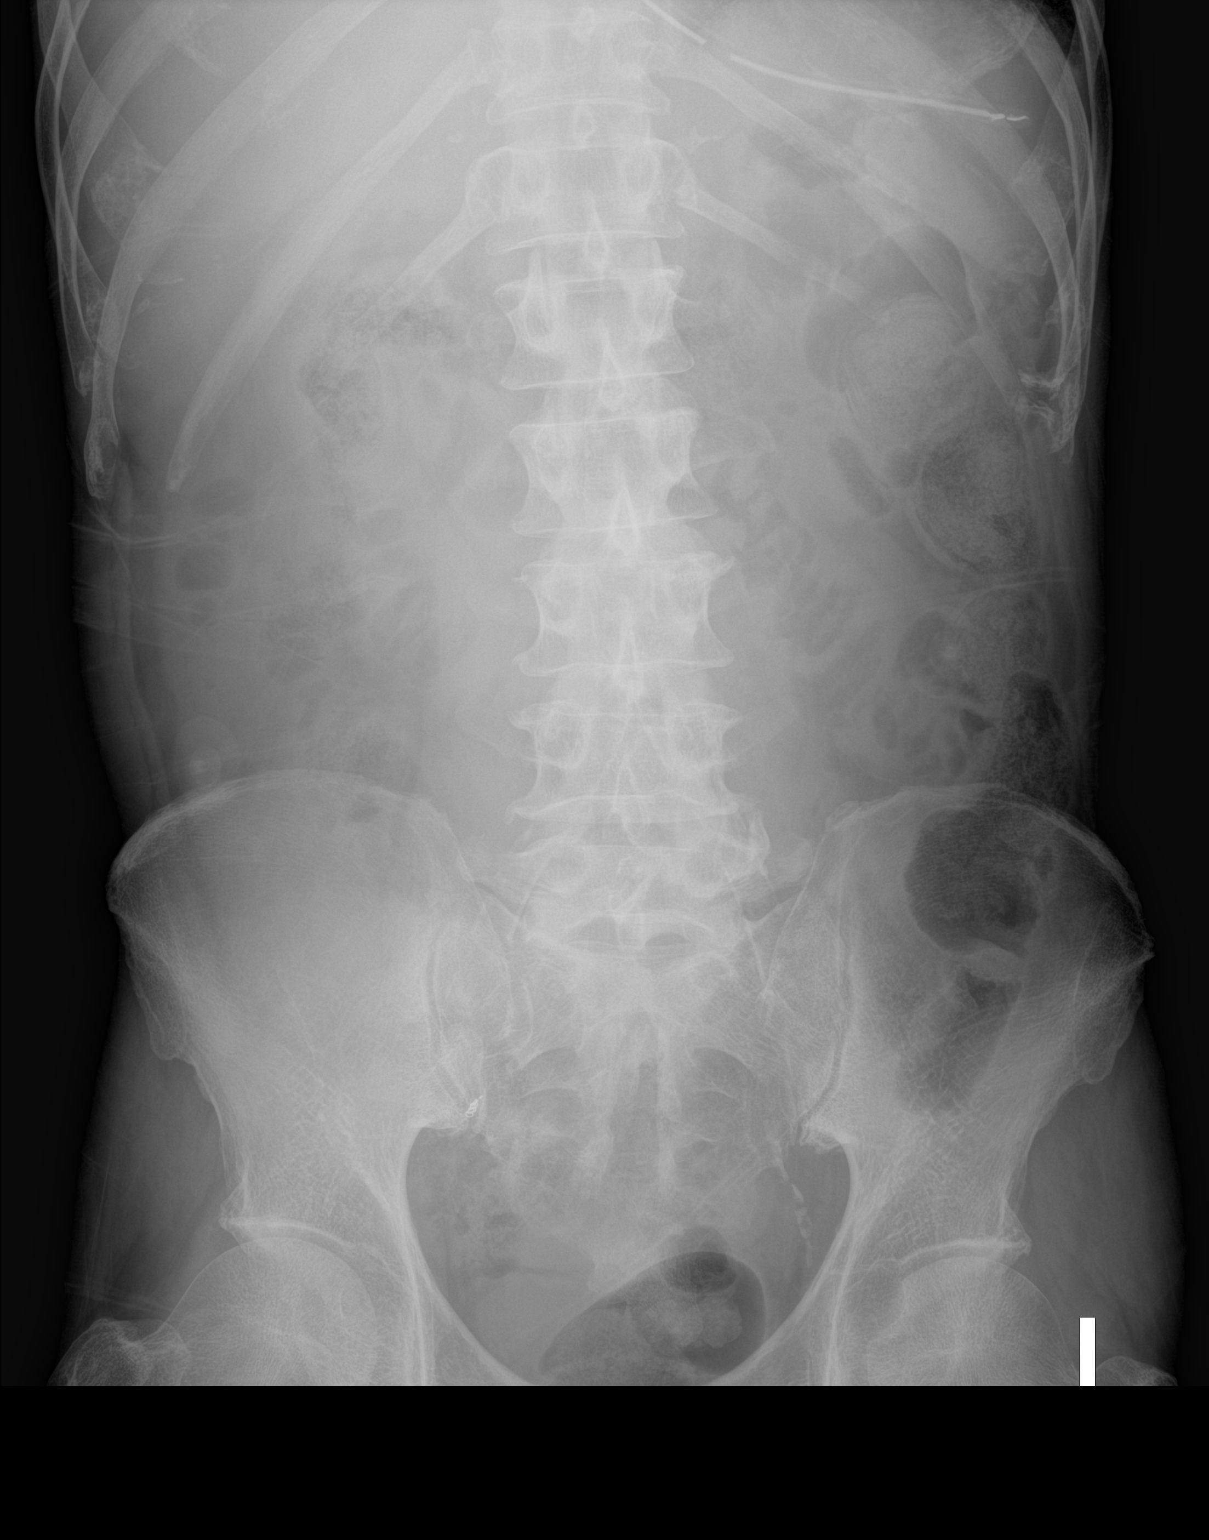

[1 of 1 positions shown; findings below may reference images not displayed]

FINDINGS: Tip and side port of the enteric tube below the diaphragm in the
stomach. Patient small-bowel distention on CT is primarily
fluid-filled and not well demonstrated by radiograph. There is stool
in the left colon.
IMPRESSION: 1. Tip and side port of the enteric tube below the diaphragm in the
stomach.
2. Small-bowel distention is primarily fluid-filled and better
demonstrated on CT yesterday.

## 2019-09-29 IMAGING — DX DG ABD PORTABLE 1V
2 series · 2 of 2 positions shown · non-contrast
Comparison: [DATE]

CLINICAL DATA: Abdominal pain.

EXAM:
PORTABLE ABDOMEN - 1 VIEW

[abdomen kub]
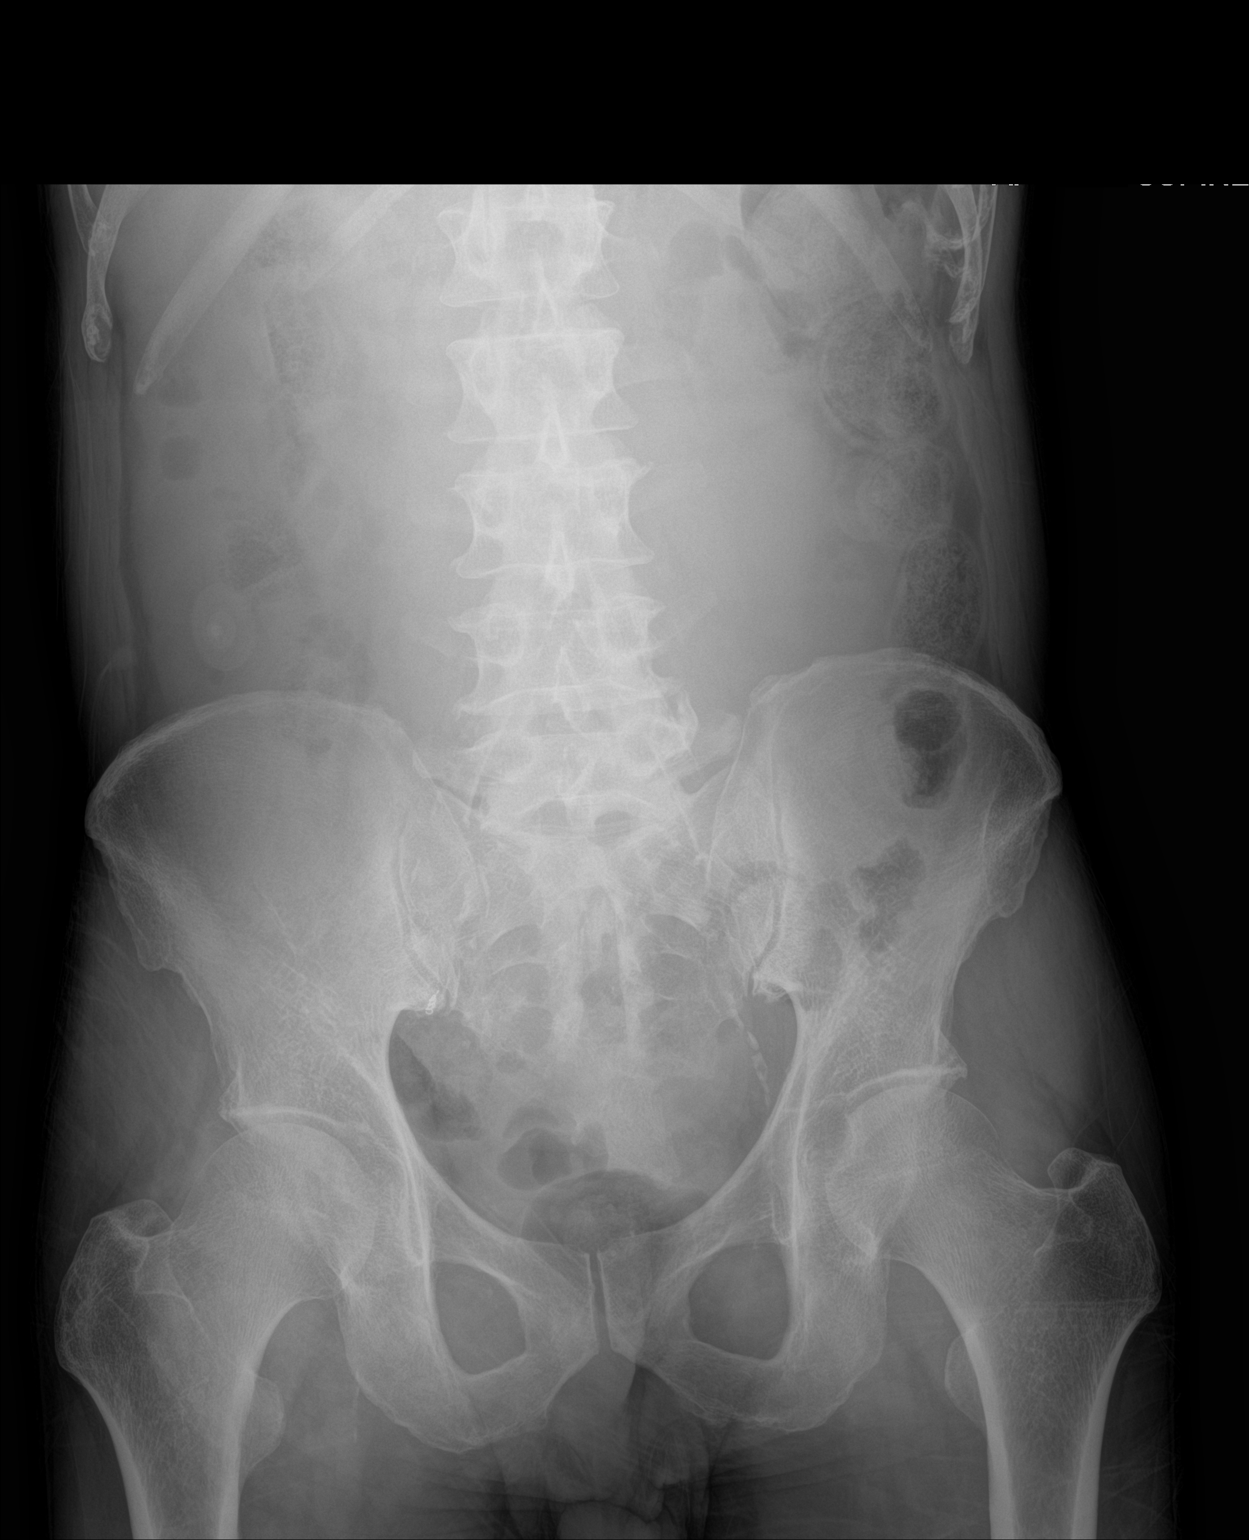

[abdomen]
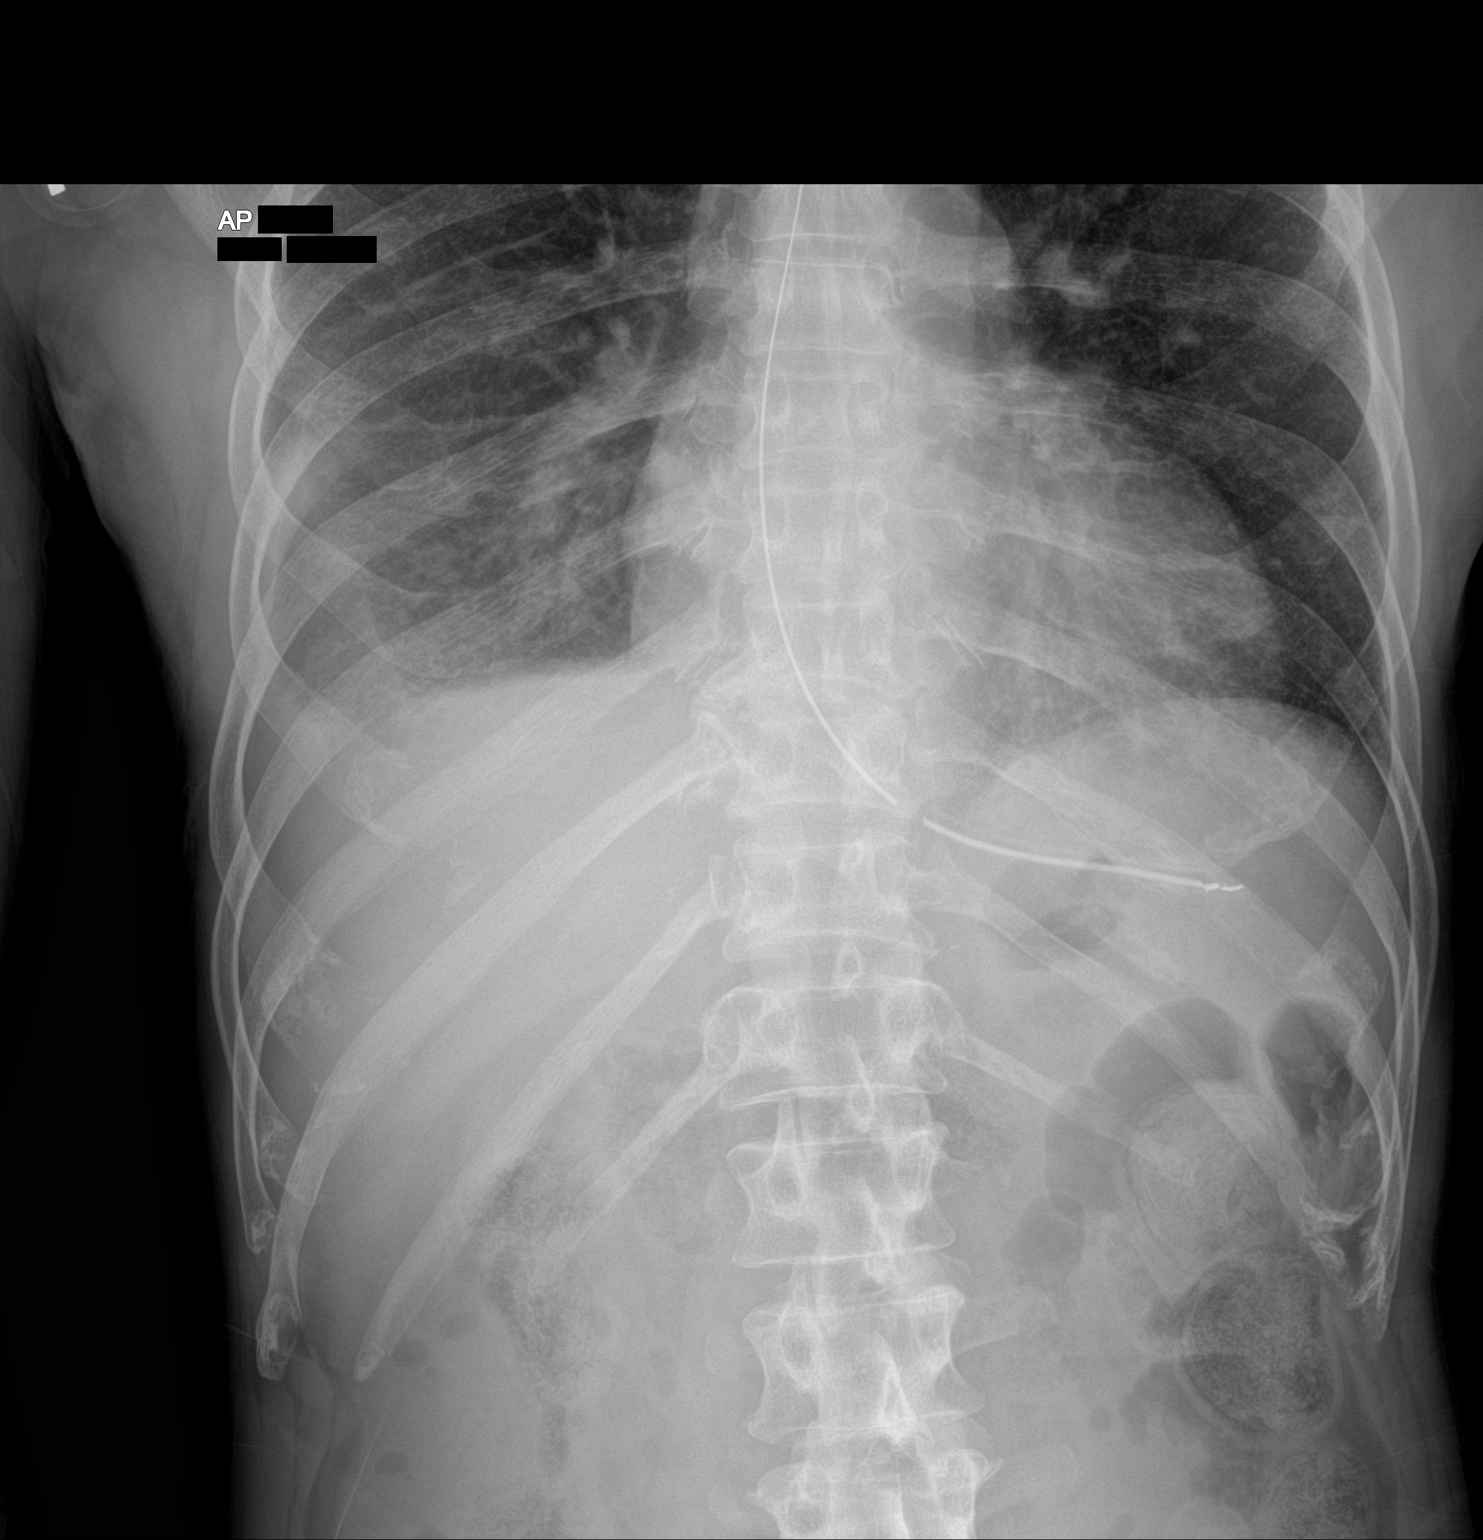

[2 of 2 positions shown; findings below may reference images not displayed]

FINDINGS: The distal tip of the nasogastric tube is seen overlying the left
upper quadrant. The bowel gas pattern is normal. A moderate amount
of stool is seen within the large bowel. No radio-opaque calculi or
other significant radiographic abnormality are seen.
IMPRESSION: 1. Moderate severity stool burden without evidence of bowel
obstruction.

## 2019-09-29 MED ORDER — DIATRIZOATE MEGLUMINE & SODIUM 66-10 % PO SOLN
90.0000 mL | Freq: Once | ORAL | Status: AC
  Administered 2019-09-29: 90 mL via NASOGASTRIC
  Filled 2019-09-29: qty 90

## 2019-09-29 NOTE — Plan of Care (Signed)
  Problem: Education: Goal: Knowledge of General Education information will improve Description: Including pain rating scale, medication(s)/side effects and non-pharmacologic comfort measures Outcome: Progressing   Problem: Pain Managment: Goal: General experience of comfort will improve Outcome: Progressing   Problem: Safety: Goal: Ability to remain free from injury will improve Outcome: Progressing

## 2019-09-29 NOTE — Telephone Encounter (Signed)
Thank you Sarah.  Chart reviewed and patient was admitted yesterday with SBO, being managed by the Hospitalist and General Surgery services.
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
## 2019-09-29 NOTE — Progress Notes (Signed)
Pt vomited large amt- with NGT in- had noted upon arrival tube was not secure in holder- while it was resecured was tested for placement- when hooked up to suction small amt drainage  drawn up into tubing- after episode tube readjusted retaped- and 237m drawn up into cannister- notified on call phys- and abd x-ray ordered to check for placement

## 2019-09-29 NOTE — Progress Notes (Addendum)
Progress Note    [name]  QJF:[id number] DOB: 1961/01/26  DOA: 09/28/2019 PCP: [name], MD    Brief Narrative:    Medical records reviewed and are as summarized below:  [name] is an 59 y.o. male with pmhx that includes scleraderma, htn, cad, hld, systolic HF admitted 6/6 with SBO. Reported 2 day hx n/v. Called GI and was referred to ED. Work up revealed SBO and NG placed and surgery conusult obtained.   Assessment/Plan:   Principal Problem:   SBO (small bowel obstruction) (HCC) Active Problems:   Scleroderma (HCC)   Chronic renal insufficiency, stage 3 (moderate)   CAD S/P percutaneous coronary angioplasty   Hyperlipidemia   Essential hypertension   Pulmonary hypertension (HCC)   Chronic systolic (congestive) heart failure ([place])  1. Small Bowel Obstruction. CT reviewed, findings suggesting SBO. Vomited around NG last night. NG tube repositioned and now draining brown liquid 850cc. General Surgery consulted and recommend continuing NG and following protocol 1. Will keep NPO with gentle IVF 2. Repeat lytes in AM 2. Scleroderma     1. Seems to be stable at this time 2. Pt follows Dr. Estanislado Pandy 3. On novasc 21m with sildenafil 234m2 tabs TID prior to admit, will resume when able to tolerate PO reliably 3. Stage 3 ckd. Seems to be near baseline. 1. Cont on gentle IVF as tolerated 4. Hx CAD s/p PCA. No chest pain 1. Followed by Cardiology (Dr. BeGwenlyn Found2. Seems stable at this time. Without chest pains 5. HTN. Fair control 1. Cont on PRN labetalol as needed 2. Would resume home meds when able to tolerate PO reliably 6. Chronic systolic CHF. Followed by heart failure team as outpatient. Most recent EF of 55% 1. As pt is NPO on gentle IVF, will hold diuretic for now 7. HLD 1. On statin as outpatient 2. Would resume simvastatin when pt is able to tolerate PO reliably    Family Communication/Anticipated D/C date and plan/Code Status   DVT  prophylaxis: Lovenox ordered. Code Status: Full Code.  Family Communication: patient Disposition Plan: Status is: Inpatient  Remains inpatient appropriate because:Inpatient level of care appropriate due to severity of illness   Dispo: The patient is from: Home              Anticipated d/c is to: Home              Anticipated d/c date is: 2 days              Patient currently is not medically stable to d/c.         Medical Consultants:    General surgery   Anti-Infectives:    None  Subjective:   Denies pain/discomfort. Requesting something to drink.   Objective:    Vitals:   09/28/19 1950 09/28/19 2005 09/29/19 0530 09/29/19 0821  BP:  (!) 145/87 (!) 152/90 (!) 145/80  Pulse:  89 91 72  Resp:  _0 Temp: 98.2 F (36.8 C) 98.4 F (36.9 C) 98.5 F (36.9 C) 98 F (36.7 C)  TempSrc: Oral Oral Oral Oral  SpO2:  99% 100% 100%    Intake/Output Summary (Last 24 hours) at 09/29/2019 1134 Last data filed at 09/29/2019 0700 Gross per 24 hour  Intake 564.82 ml  Output 1050 ml  Net -485.18 ml   There were no vitals filed for this visit.  Exam: Gen: thin frail no acute distress CV rrr no mgr no LE  edema Resp: no increased work of breathing, BS clear bilaterally Abd: mild distention, firm BS quite sluggish, moderate diffuse tenderness to palpation. NG in tact MS: joints without swelling/erythema full rom Neuro: alert oriented x3 speech clear  Data Reviewed:   I have personally reviewed following labs and imaging studies:  Labs: Labs show the following:   Basic Metabolic Panel: Recent Labs  Lab 09/28/19 1310 09/29/19 0425  NA 145 145  K 4.0 4.0  CL 108 110  CO2 21* 20*  GLUCOSE 122* 101*  BUN 51* 58*  CREATININE 3.05* 3.53*  CALCIUM 8.5* 7.9*   GFR Estimated Creatinine Clearance: 17.5 mL/min (A) (by C-G formula based on SCr of 3.53 mg/dL (H)). Liver Function Tests: Recent Labs  Lab 09/28/19 1310 09/29/19 0425  AST 22 16  ALT 15 12    ALKPHOS 77 66  BILITOT 0.6 0.4  PROT 8.0 7.0  ALBUMIN 4.2 3.7   Recent Labs  Lab 09/28/19 1310  LIPASE 40   No results for input(s): AMMONIA in the last 168 hours. Coagulation profile No results for input(s): INR, PROTIME in the last 168 hours.  CBC: Recent Labs  Lab 09/28/19 1310 09/29/19 0425  WBC 5.6 4.7  HGB 9.9* 9.2*  HCT 32.7* 30.5*  MCV 101.9* 100.0  PLT 193 166   Cardiac Enzymes: No results for input(s): CKTOTAL, CKMB, CKMBINDEX, TROPONINI in the last 168 hours. BNP (last 3 results) No results for input(s): PROBNP in the last 8760 hours. CBG: No results for input(s): GLUCAP in the last 168 hours. D-Dimer: No results for input(s): DDIMER in the last 72 hours. Hgb A1c: No results for input(s): HGBA1C in the last 72 hours. Lipid Profile: No results for input(s): CHOL, HDL, LDLCALC, TRIG, CHOLHDL, LDLDIRECT in the last 72 hours. Thyroid function studies: No results for input(s): TSH, T4TOTAL, T3FREE, THYROIDAB in the last 72 hours.  Invalid input(s): FREET3 Anemia work up: No results for input(s): VITAMINB12, FOLATE, FERRITIN, TIBC, IRON, RETICCTPCT in the last 72 hours. Sepsis Labs: Recent Labs  Lab 09/28/19 1310 09/29/19 0425  WBC 5.6 4.7    Microbiology Recent Results (from the past 240 hour(s))  SARS Coronavirus 2 by RT PCR (hospital order, performed in [facility] hospital lab) Nasopharyngeal Nasopharyngeal Swab     Status: None   Collection Time: 09/28/19  4:59 PM   Specimen: Nasopharyngeal Swab  Result Value Ref Range Status   SARS Coronavirus 2 NEGATIVE NEGATIVE Final    Comment: (NOTE) SARS-CoV-2 target nucleic acids are NOT DETECTED. The SARS-CoV-2 RNA is generally detectable in upper and lower respiratory specimens during the acute phase of infection. The lowest concentration of SARS-CoV-2 viral copies this assay can detect is 250 copies / mL. A negative result does not preclude SARS-CoV-2 infection and should not be used as the sole  basis for treatment or other patient management decisions.  A negative result may occur with improper specimen collection / handling, submission of specimen other than nasopharyngeal swab, presence of viral mutation(s) within the areas targeted by this assay, and inadequate number of viral copies (<250 copies / mL). A negative result must be combined with clinical observations, patient history, and epidemiological information. Fact Sheet for Patients:   StrictlyIdeas.no Fact Sheet for Healthcare Providers: BankingDealers.co.za This test is not yet approved or cleared  by the [place] FDA and has been authorized for detection and/or diagnosis of SARS-CoV-2 by FDA under an Emergency Use Authorization (EUA).  This EUA will remain in effect (meaning this test  can be used) for the duration of the COVID-19 declaration under Section 564(b)(1) of the Act, 21 U.S.C. section 360bbb-3(b)(1), unless the authorization is terminated or revoked sooner. Performed at [facility], [place] [address], [place], [place] [address]     Procedures and diagnostic studies:  CT Abdomen Pelvis Wo Contrast  Result Date: 09/28/2019 CLINICAL DATA:  Vomiting for 2 days EXAM: CT ABDOMEN AND PELVIS WITHOUT CONTRAST TECHNIQUE: Multidetector CT imaging of the abdomen and pelvis was performed following the standard protocol without IV contrast. COMPARISON:  09/14/2018 FINDINGS: Lower chest: Chronic small right pleural effusion unchanged. Bibasilar scarring and fibrosis again noted. Stable atherosclerosis of the coronary vasculature. Hepatobiliary: Calcified gallstone identified without cholecystitis. No focal liver abnormalities. Pancreas: Unremarkable. No pancreatic ductal dilatation or surrounding inflammatory changes. Spleen: Normal in size without focal abnormality. Adrenals/Urinary Tract: No urinary tract calculi or obstructive uropathy within either kidney. Bladder is  unremarkable. Adrenals are normal. Stomach/Bowel: Multiple dilated loops of proximal small bowel to the level of the distal jejunum, measuring up to 3.8 cm in diameter. There are diffuse gas fluid level seen within the proximal to mid jejunum, consistent small bowel obstruction. The exact point of obstruction is difficult to identify, but may be within the central pelvis on image 68 at a site of jejunal wall thickening. There is a normal appendix right lower quadrant. Vascular/Lymphatic: Aortic atherosclerosis. No enlarged abdominal or pelvic lymph nodes. Reproductive: Prostate is unremarkable. Other: There is trace free fluid in the sub hepatic region. No free intraperitoneal gas. Musculoskeletal: No acute or destructive bony lesions. Reconstructed images demonstrate no additional findings. IMPRESSION: 1. Small-bowel obstruction, with the exact point of obstruction difficult to identify, but may be within the central pelvis at a site of jejunal wall thickening. 2. Cholelithiasis without cholecystitis. 3. Chronic small right pleural effusion. 4. Aortic Atherosclerosis (ICD10-I70.0). Electronically Signed   By: Randa Ngo M.D.   On: 09/28/2019 16:54   DG Abd 1 View  Result Date: 09/28/2019 CLINICAL DATA:  Status post nasogastric tube placement. EXAM: ABDOMEN - 1 VIEW COMPARISON:  None. FINDINGS: A nasogastric tube is seen with its distal tip overlying the body of the stomach. There is opacification of the visualized portion of the right lung base. The bowel gas pattern is normal. No radio-opaque calculi or other significant radiographic abnormality are seen. Radiopaque surgical coils are seen overlying the pelvis on the right. IMPRESSION: Nasogastric tube positioning, as described above. Electronically Signed   By: Virgina Norfolk M.D.   On: 09/28/2019 17:52   DG Abd Portable 1V  Result Date: 09/29/2019 CLINICAL DATA:  NG tube placement. EXAM: PORTABLE ABDOMEN - 1 VIEW COMPARISON:  Radiograph in CT  yesterday. FINDINGS: Tip and side port of the enteric tube below the diaphragm in the stomach. Patient small-bowel distention on CT is primarily fluid-filled and not well demonstrated by radiograph. There is stool in the left colon. IMPRESSION: 1. Tip and side port of the enteric tube below the diaphragm in the stomach. 2. Small-bowel distention is primarily fluid-filled and better demonstrated on CT yesterday. Electronically Signed   By: Keith Rake M.D.   On: 09/29/2019 01:15    Medications:   . heparin  5,000 Units Subcutaneous Q8H  . sodium chloride flush  3 mL Intravenous Once   Continuous Infusions: . sodium chloride 50 mL/hr at 09/29/19 1128     LOS: 1 day   [facility] M NP  [facility]   How to contact the [facility] Attending or Consulting  provider [place] or covering provider during after hours [place], for this patient?  1. Check the care team in [facility] and look for a) attending/consulting [facility] provider listed and b) the [facility] team listed 2. Log into [email or web address] and use [place]'s universal password to access. If you do not have the password, please contact the hospital operator. 3. Locate the [facility] provider you are looking for under [facility] and page to a number that you can be directly reached. 4. If you still have difficulty reaching the provider, please page the [facility] (Director on Call) for the Hospitalists listed on amion for assistance.  09/29/2019, 11:34 AM

## 2019-09-30 ENCOUNTER — Encounter (HOSPITAL_COMMUNITY): Payer: 59

## 2019-09-30 ENCOUNTER — Inpatient Hospital Stay (HOSPITAL_COMMUNITY): Payer: 59

## 2019-09-30 LAB — CBC
HCT: 32.6 % — ABNORMAL LOW (ref 39.0–52.0)
Hemoglobin: 9.8 g/dL — ABNORMAL LOW (ref 13.0–17.0)
MCH: 30.3 pg (ref 26.0–34.0)
MCHC: 30.1 g/dL (ref 30.0–36.0)
MCV: 100.9 fL — ABNORMAL HIGH (ref 80.0–100.0)
Platelets: 190 10*3/uL (ref 150–400)
RBC: 3.23 MIL/uL — ABNORMAL LOW (ref 4.22–5.81)
RDW: 15.9 % — ABNORMAL HIGH (ref 11.5–15.5)
WBC: 4.8 10*3/uL (ref 4.0–10.5)
nRBC: 0 % (ref 0.0–0.2)

## 2019-09-30 LAB — COMPREHENSIVE METABOLIC PANEL
ALT: 12 U/L (ref 0–44)
AST: 15 U/L (ref 15–41)
Albumin: 3.9 g/dL (ref 3.5–5.0)
Alkaline Phosphatase: 67 U/L (ref 38–126)
Anion gap: 18 — ABNORMAL HIGH (ref 5–15)
BUN: 73 mg/dL — ABNORMAL HIGH (ref 6–20)
CO2: 21 mmol/L — ABNORMAL LOW (ref 22–32)
Calcium: 7.9 mg/dL — ABNORMAL LOW (ref 8.9–10.3)
Chloride: 108 mmol/L (ref 98–111)
Creatinine, Ser: 4.53 mg/dL — ABNORMAL HIGH (ref 0.61–1.24)
GFR calc Af Amer: 15 mL/min — ABNORMAL LOW (ref >60)
GFR calc non Af Amer: 13 mL/min — ABNORMAL LOW (ref >60)
Glucose, Bld: 100 mg/dL — ABNORMAL HIGH (ref 70–99)
Potassium: 4.3 mmol/L (ref 3.5–5.1)
Sodium: 147 mmol/L — ABNORMAL HIGH (ref 135–145)
Total Bilirubin: 0.4 mg/dL (ref 0.3–1.2)
Total Protein: 7 g/dL (ref 6.5–8.1)

## 2019-09-30 IMAGING — DX DG ABD PORTABLE 1V
1 series · 1 of 1 positions shown · non-contrast
Comparison: Plain films of the abdomen [DATE]. CT abdomen and
pelvis [DATE].

CLINICAL DATA: Small bowel obstruction.

EXAM:
PORTABLE ABDOMEN - 1 VIEW

[abdomen]
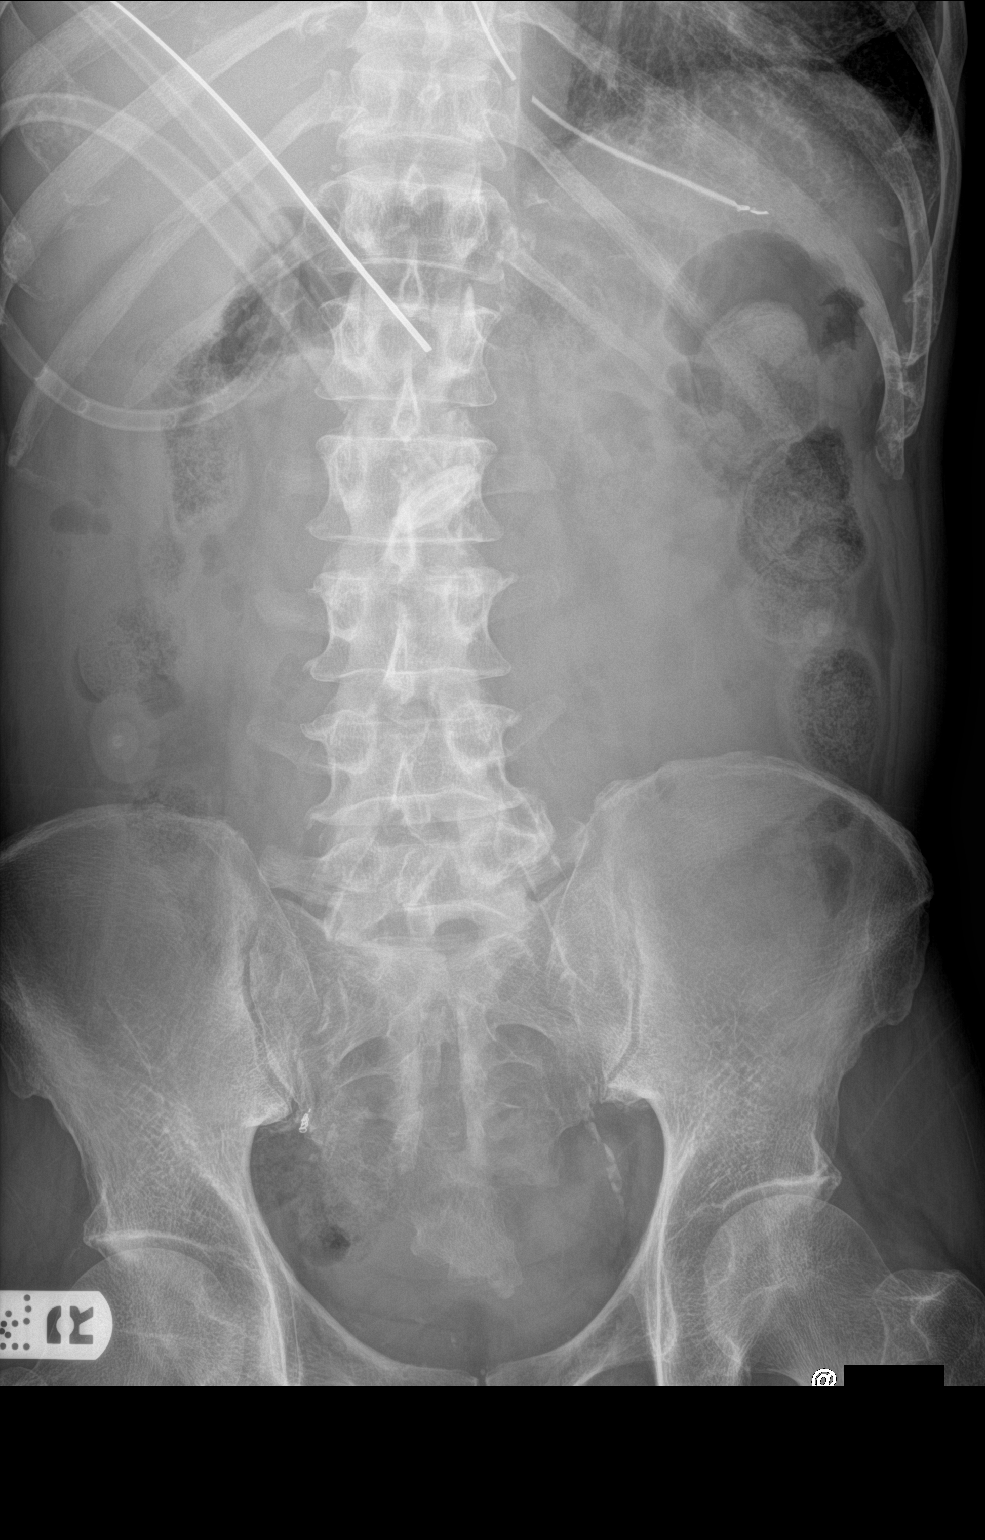

[1 of 1 positions shown; findings below may reference images not displayed]

FINDINGS: NG tube side port is above the stomach. The tube should be advanced
5-6 cm. No small bowel dilatation is seen. There is gas and stool in
the colon.
IMPRESSION: NG tube side port is above the gastroesophageal junction and the
tube should be advanced 5-6 cm.

No plain film evidence of bowel obstruction.

## 2019-09-30 MED ORDER — FLEET ENEMA 7-19 GM/118ML RE ENEM
1.0000 | ENEMA | Freq: Once | RECTAL | Status: AC
  Administered 2019-09-30: 1 via RECTAL
  Filled 2019-09-30: qty 1

## 2019-09-30 MED ORDER — MAGNESIUM SULFATE 4 GM/100ML IV SOLN
4.0000 g | Freq: Once | INTRAVENOUS | Status: AC
  Administered 2019-09-30: 4 g via INTRAVENOUS
  Filled 2019-09-30: qty 100

## 2019-09-30 MED ORDER — BISACODYL 10 MG RE SUPP
10.0000 mg | Freq: Every day | RECTAL | Status: DC | PRN
  Administered 2019-09-30 – 2019-10-04 (×2): 10 mg via RECTAL
  Filled 2019-09-30 (×2): qty 1

## 2019-09-30 MED ORDER — BISACODYL 10 MG RE SUPP: 10.0000 mg | Freq: Every day | RECTAL | Status: DC | PRN
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
## 2019-09-30 NOTE — Progress Notes (Signed)
Progress Note    [name]  HWK:[id number] DOB: June 11, 1960  DOA: 09/28/2019 PCP: [name], MD    Brief Narrative:     Medical records reviewed and are as summarized below:  [name] is an 59 y.o. male with medical history significant of scleroderma, HTN, CAD, HLD, systolic CHF who presents to ED with complaints of 2 days hx of n/v, unable to tolerate PO. Patient recently had discussed with GI regarding coughing up blood and was planned for EGD and colonoscopy at a later date. Patient later called GI with complaints of frequent bilious emesis without coffee grounds. Patient was referred to ED for further work up.  Assessment/Plan:   Principal Problem:   SBO (small bowel obstruction) (HCC) Active Problems:   Scleroderma (HCC)   Chronic renal insufficiency, stage 3 (moderate)   CAD S/P percutaneous coronary angioplasty   Hyperlipidemia   Essential hypertension   Pulmonary hypertension (HCC)   Chronic systolic (congestive) heart failure (HCC)   Small Bowel Obstruction with marked large bowel constipation   -CT reviewed, findings suggesting SBO. Vomited around NG last night. NG tube repositioned and now draining brown liquid  - General Surgery consulted and recommend continuing NG and following protocol  -enema today followed by daily suppository  Scleroderma -Seems to be stable at this time -Pt follows Dr. Estanislado Pandy -On novasc 51m with sildenafil 250m2 tabs TID prior to admit, will resume when able to tolerate PO reliably  AKI with Stage 3b ckd -Cont on gentle IVF as tolerated -check bladder scan -strict I/os -monitor labs  Hx CAD s/p PCA. No chest pain -Followed by Cardiology (Dr. BeGwenlyn Found HTN. Fair control -Cont on PRN labetalol as needed -resume home meds when able to tolerate PO reliably  Chronic systolic CHF. Followed by heart failure team as outpatient. Most recent EF of 55% -As pt is NPO on gentle IVF, will hold diuretic for  now  HLD -On statin as outpatient -resume simvastatin when pt is able to tolerate PO reliably  Hypomagnesemia  -replete IV x 4 grams   Family Communication/Anticipated D/C date and plan/Code Status   DVT prophylaxis: Heparin Code Status: Full Code.  Disposition Plan: Status is: Inpatient  Remains inpatient appropriate because:Persistent severe electrolyte disturbances   Dispo: The patient is from: Home              Anticipated d/c is to: Home              Anticipated d/c date is: 3 days              Patient currently is not medically stable to d/c.   Medical Consultants:    General surgery   Subjective:   Patient would like something to drink  Objective:    Vitals:   09/30/19 0241 09/30/19 0437 09/30/19 0830 09/30/19 0845  BP: 136/79  135/83   Pulse: 73  78   Resp: 16  14   Temp: 97.9 F (36.6 C)  97.8 F (36.6 C)   TempSrc: Oral  Oral   SpO2: 99%  100%   Weight:  54.9 kg    Height:    _0  (1.727 m)    Intake/Output Summary (Last 24 hours) at 09/30/2019 1020 Last data filed at 09/30/2019 0500 Gross per 24 hour  Intake --  Output 2200 ml  Net -2200 ml   Filed Weights   09/30/19 0437  Weight: 54.9 kg    Exam:  General: Appearance:  Thin male in no acute distress   abdomen taut, NG tube in place  Lungs:     Clear to auscultation bilaterally, respirations unlabored  Heart:    Normal heart rate. Normal rhythm. No murmurs, rubs, or gallops.   MS:   All extremities are intact.   Neurologic:   Awake, alert, oriented x 3. No apparent focal neurological           defect.     Data Reviewed:   I have personally reviewed following labs and imaging studies:  Labs: Labs show the following:   Basic Metabolic Panel: Recent Labs  Lab 09/28/19 1310 09/28/19 1310 09/29/19 0425 09/29/19 1159 09/30/19 0358  NA 145  --  145  --  147*  K 4.0   < > 4.0  --  4.3  CL 108  --  110  --  108  CO2 21*  --  20*  --  21*  GLUCOSE 122*  --  101*  --  100*   BUN 51*  --  58*  --  73*  CREATININE 3.05*  --  3.53*  --  4.53*  CALCIUM 8.5*  --  7.9*  --  7.9*  MG  --   --   --  1.0*  --    < > = values in this interval not displayed.   GFR Estimated Creatinine Clearance: 13.5 mL/min (A) (by C-G formula based on SCr of 4.53 mg/dL (H)). Liver Function Tests: Recent Labs  Lab 09/28/19 1310 09/29/19 0425 09/30/19 0358  AST _0 ALT _1 ALKPHOS 77 66 67  BILITOT 0.6 0.4 0.4  PROT 8.0 7.0 7.0  ALBUMIN 4.2 3.7 3.9   Recent Labs  Lab 09/28/19 1310  LIPASE 40   No results for input(s): AMMONIA in the last 168 hours. Coagulation profile No results for input(s): INR, PROTIME in the last 168 hours.  CBC: Recent Labs  Lab 09/28/19 1310 09/29/19 0425 09/30/19 0358  WBC 5.6 4.7 4.8  HGB 9.9* 9.2* 9.8*  HCT 32.7* 30.5* 32.6*  MCV 101.9* 100.0 100.9*  PLT 193 166 190   Cardiac Enzymes: No results for input(s): CKTOTAL, CKMB, CKMBINDEX, TROPONINI in the last 168 hours. BNP (last 3 results) No results for input(s): PROBNP in the last 8760 hours. CBG: No results for input(s): GLUCAP in the last 168 hours. D-Dimer: No results for input(s): DDIMER in the last 72 hours. Hgb A1c: No results for input(s): HGBA1C in the last 72 hours. Lipid Profile: No results for input(s): CHOL, HDL, LDLCALC, TRIG, CHOLHDL, LDLDIRECT in the last 72 hours. Thyroid function studies: No results for input(s): TSH, T4TOTAL, T3FREE, THYROIDAB in the last 72 hours.  Invalid input(s): FREET3 Anemia work up: No results for input(s): VITAMINB12, FOLATE, FERRITIN, TIBC, IRON, RETICCTPCT in the last 72 hours. Sepsis Labs: Recent Labs  Lab 09/28/19 1310 09/29/19 0425 09/30/19 0358  WBC 5.6 4.7 4.8    Microbiology Recent Results (from the past 240 hour(s))  SARS Coronavirus 2 by RT PCR (hospital order, performed in [facility] hospital lab) Nasopharyngeal Nasopharyngeal Swab     Status: None   Collection Time: 09/28/19  4:59 PM   Specimen:  Nasopharyngeal Swab  Result Value Ref Range Status   SARS Coronavirus 2 NEGATIVE NEGATIVE Final    Comment: (NOTE) SARS-CoV-2 target nucleic acids are NOT DETECTED. The SARS-CoV-2 RNA is generally detectable in upper and lower respiratory specimens during the acute phase of infection. The lowest  concentration of SARS-CoV-2 viral copies this assay can detect is 250 copies / mL. A negative result does not preclude SARS-CoV-2 infection and should not be used as the sole basis for treatment or other patient management decisions.  A negative result may occur with improper specimen collection / handling, submission of specimen other than nasopharyngeal swab, presence of viral mutation(s) within the areas targeted by this assay, and inadequate number of viral copies (<250 copies / mL). A negative result must be combined with clinical observations, patient history, and epidemiological information. Fact Sheet for Patients:   StrictlyIdeas.no Fact Sheet for Healthcare Providers: BankingDealers.co.za This test is not yet approved or cleared  by the [place] FDA and has been authorized for detection and/or diagnosis of SARS-CoV-2 by FDA under an Emergency Use Authorization (EUA).  This EUA will remain in effect (meaning this test can be used) for the duration of the COVID-19 declaration under Section 564(b)(1) of the Act, 21 U.S.C. section 360bbb-3(b)(1), unless the authorization is terminated or revoked sooner. Performed at [facility], [place] [address], [place], [place] [address]     Procedures and diagnostic studies:  CT Abdomen Pelvis Wo Contrast  Result Date: 09/28/2019 CLINICAL DATA:  Vomiting for 2 days EXAM: CT ABDOMEN AND PELVIS WITHOUT CONTRAST TECHNIQUE: Multidetector CT imaging of the abdomen and pelvis was performed following the standard protocol without IV contrast. COMPARISON:  09/14/2018 FINDINGS: Lower chest: Chronic  small right pleural effusion unchanged. Bibasilar scarring and fibrosis again noted. Stable atherosclerosis of the coronary vasculature. Hepatobiliary: Calcified gallstone identified without cholecystitis. No focal liver abnormalities. Pancreas: Unremarkable. No pancreatic ductal dilatation or surrounding inflammatory changes. Spleen: Normal in size without focal abnormality. Adrenals/Urinary Tract: No urinary tract calculi or obstructive uropathy within either kidney. Bladder is unremarkable. Adrenals are normal. Stomach/Bowel: Multiple dilated loops of proximal small bowel to the level of the distal jejunum, measuring up to 3.8 cm in diameter. There are diffuse gas fluid level seen within the proximal to mid jejunum, consistent small bowel obstruction. The exact point of obstruction is difficult to identify, but may be within the central pelvis on image 68 at a site of jejunal wall thickening. There is a normal appendix right lower quadrant. Vascular/Lymphatic: Aortic atherosclerosis. No enlarged abdominal or pelvic lymph nodes. Reproductive: Prostate is unremarkable. Other: There is trace free fluid in the sub hepatic region. No free intraperitoneal gas. Musculoskeletal: No acute or destructive bony lesions. Reconstructed images demonstrate no additional findings. IMPRESSION: 1. Small-bowel obstruction, with the exact point of obstruction difficult to identify, but may be within the central pelvis at a site of jejunal wall thickening. 2. Cholelithiasis without cholecystitis. 3. Chronic small right pleural effusion. 4. Aortic Atherosclerosis (ICD10-I70.0). Electronically Signed   By: Randa Ngo M.D.   On: 09/28/2019 16:54   DG Abd 1 View  Result Date: 09/28/2019 CLINICAL DATA:  Status post nasogastric tube placement. EXAM: ABDOMEN - 1 VIEW COMPARISON:  None. FINDINGS: A nasogastric tube is seen with its distal tip overlying the body of the stomach. There is opacification of the visualized portion of the  right lung base. The bowel gas pattern is normal. No radio-opaque calculi or other significant radiographic abnormality are seen. Radiopaque surgical coils are seen overlying the pelvis on the right. IMPRESSION: Nasogastric tube positioning, as described above. Electronically Signed   By: Virgina Norfolk M.D.   On: 09/28/2019 17:52   DG Abd Portable 1V-Small Bowel Obstruction Protocol-initial, 8 hr delay  Result Date: 09/29/2019 CLINICAL DATA:  Abdominal pain. EXAM: PORTABLE ABDOMEN - 1 VIEW COMPARISON:  September 29, 2019 FINDINGS: The distal tip of the nasogastric tube is seen overlying the left upper quadrant. The bowel gas pattern is normal. A moderate amount of stool is seen within the large bowel. No radio-opaque calculi or other significant radiographic abnormality are seen. IMPRESSION: 1. Moderate severity stool burden without evidence of bowel obstruction. Electronically Signed   By: Virgina Norfolk M.D.   On: 09/29/2019 20:23   DG Abd Portable 1V  Result Date: 09/29/2019 CLINICAL DATA:  NG tube placement. EXAM: PORTABLE ABDOMEN - 1 VIEW COMPARISON:  Radiograph in CT yesterday. FINDINGS: Tip and side port of the enteric tube below the diaphragm in the stomach. Patient small-bowel distention on CT is primarily fluid-filled and not well demonstrated by radiograph. There is stool in the left colon. IMPRESSION: 1. Tip and side port of the enteric tube below the diaphragm in the stomach. 2. Small-bowel distention is primarily fluid-filled and better demonstrated on CT yesterday. Electronically Signed   By: Keith Rake M.D.   On: 09/29/2019 01:15    Medications:   . heparin  5,000 Units Subcutaneous Q8H  . sodium chloride flush  3 mL Intravenous Once   Continuous Infusions: . sodium chloride 50 mL/hr at 09/30/19 0725     LOS: 2 days   Geradine Girt  [facility]   How to contact the [facility] Attending or Consulting provider [place] or covering provider during after hours [place], for  this patient?  1. Check the care team in [facility] and look for a) attending/consulting [facility] provider listed and b) the [facility] team listed 2. Log into [email or web address] and use [place]'s universal password to access. If you do not have the password, please contact the hospital operator. 3. Locate the [facility] provider you are looking for under [facility] and page to a number that you can be directly reached. 4. If you still have difficulty reaching the provider, please page the [facility] (Director on Call) for the Hospitalists listed on amion for assistance.  09/30/2019, 10:20 AM

## 2019-10-01 DIAGNOSIS — I5022 Chronic systolic (congestive) heart failure: Secondary | ICD-10-CM

## 2019-10-01 DIAGNOSIS — D649 Anemia, unspecified: Secondary | ICD-10-CM

## 2019-10-01 DIAGNOSIS — Z9861 Coronary angioplasty status: Secondary | ICD-10-CM

## 2019-10-01 DIAGNOSIS — E782 Mixed hyperlipidemia: Secondary | ICD-10-CM

## 2019-10-01 LAB — BASIC METABOLIC PANEL
Anion gap: 14 (ref 5–15)
Anion gap: 11 (ref 5–15)
BUN: 83 mg/dL — ABNORMAL HIGH (ref 6–20)
BUN: 78 mg/dL — ABNORMAL HIGH (ref 6–20)
CO2: 20 mmol/L — ABNORMAL LOW (ref 22–32)
CO2: 22 mmol/L (ref 22–32)
Calcium: 8.3 mg/dL — ABNORMAL LOW (ref 8.9–10.3)
Calcium: 8.3 mg/dL — ABNORMAL LOW (ref 8.9–10.3)
Chloride: 115 mmol/L — ABNORMAL HIGH (ref 98–111)
Chloride: 111 mmol/L (ref 98–111)
Creatinine, Ser: 4.52 mg/dL — ABNORMAL HIGH (ref 0.61–1.24)
Creatinine, Ser: 4.01 mg/dL — ABNORMAL HIGH (ref 0.61–1.24)
GFR calc Af Amer: 15 mL/min — ABNORMAL LOW (ref >60)
GFR calc Af Amer: 18 mL/min — ABNORMAL LOW (ref >60)
GFR calc non Af Amer: 13 mL/min — ABNORMAL LOW (ref >60)
GFR calc non Af Amer: 15 mL/min — ABNORMAL LOW (ref >60)
Glucose, Bld: 83 mg/dL (ref 70–99)
Glucose, Bld: 107 mg/dL — ABNORMAL HIGH (ref 70–99)
Potassium: 4.2 mmol/L (ref 3.5–5.1)
Potassium: 4 mmol/L (ref 3.5–5.1)
Sodium: 149 mmol/L — ABNORMAL HIGH (ref 135–145)
Sodium: 144 mmol/L (ref 135–145)

## 2019-10-01 LAB — CBC
HCT: 31.5 % — ABNORMAL LOW (ref 39.0–52.0)
Hemoglobin: 9.5 g/dL — ABNORMAL LOW (ref 13.0–17.0)
MCH: 30.9 pg (ref 26.0–34.0)
MCHC: 30.2 g/dL (ref 30.0–36.0)
MCV: 102.6 fL — ABNORMAL HIGH (ref 80.0–100.0)
Platelets: 176 10*3/uL (ref 150–400)
RBC: 3.07 MIL/uL — ABNORMAL LOW (ref 4.22–5.81)
RDW: 15.9 % — ABNORMAL HIGH (ref 11.5–15.5)
WBC: 5 10*3/uL (ref 4.0–10.5)
nRBC: 0 % (ref 0.0–0.2)

## 2019-10-01 LAB — MAGNESIUM: Magnesium: 3 mg/dL — ABNORMAL HIGH (ref 1.7–2.4)

## 2019-10-01 MED ORDER — ACETAMINOPHEN 325 MG PO TABS: 650.0000 mg | ORAL_TABLET | Freq: Four times a day (QID) | ORAL | Status: DC | PRN

## 2019-10-01 MED ORDER — ENSURE ENLIVE PO LIQD
237.0000 mL | Freq: Three times a day (TID) | ORAL | Status: DC
  Administered 2019-10-01 – 2019-10-02 (×2): 237 mL via ORAL

## 2019-10-01 MED ORDER — DEXTROSE-NACL 5-0.2 % IV SOLN: INTRAVENOUS | Status: DC

## 2019-10-01 MED ORDER — OXYCODONE HCL 5 MG PO TABS: 5.0000 mg | ORAL_TABLET | Freq: Four times a day (QID) | ORAL | Status: DC | PRN

## 2019-10-01 MED ORDER — ADULT MULTIVITAMIN W/MINERALS CH
1.0000 | ORAL_TABLET | Freq: Every day | ORAL | Status: DC
  Administered 2019-10-01: 1 via ORAL
  Filled 2019-10-01 (×2): qty 1

## 2019-10-01 NOTE — Plan of Care (Signed)
Problem: Education: Goal: Knowledge of General Education information will improve Description: Including pain rating scale, medication(s)/side effects and non-pharmacologic comfort measures Outcome: Progressing   Problem: Nutrition: Goal: Adequate nutrition will be maintained Outcome: Not Progressing

## 2019-10-01 NOTE — Progress Notes (Signed)
Initial Nutrition Assessment  DOCUMENTATION CODES:   Underweight, Severe malnutrition in context of chronic illness  INTERVENTION:   -Ensure Enlive po TID, each supplement provides 350 kcal and 20 grams of protein -Magic cup TID with meals, each supplement provides 290 kcal and 9 grams of protein -MVI with minerals daily  NUTRITION DIAGNOSIS:   Severe Malnutrition related to chronic illness(CHF, scleroderma) as evidenced by moderate fat depletion, severe fat depletion, moderate muscle depletion, severe muscle depletion.  GOAL:   Patient will meet greater than or equal to 90% of their needs  MONITOR:   PO intake, Supplement acceptance, Diet advancement, Labs, Weight trends, Skin, I & O's  REASON FOR ASSESSMENT:   Consult Assessment of nutrition requirement/status  ASSESSMENT:   [name] is a 59 y.o. male with medical history significant of scleroderma, HTN, CAD, HLD, systolic CHF who presents to ED with complaints of 2 days hx of n/v, unable to tolerate PO. Patient recently had discussed with GI regarding coughing up blood and was planned for EGD and colonoscopy at a later date. Patient later called GI with complaints of frequent bilious emesis without coffee grounds. Patient was referred to ED for further work up.  Pt admitted with SBO.   6/6- NGT placed 6/9- NGT removed, advanced to full liquid diet  Reviewed I/O's: -1.6 L x 24 hours and -4.2 L since admission  UOP: 950 ml x 24 hours  NGT output: 600 ml x 24 hours  Pt waking up from nap at time of visit and not very talkative. He deferred conversation to his wife at bedside. Per wife, pt has a great appetite. He does not eat structured meals, but grazes throughout the day. Pt's preference is very calorie dense foods, such as rice, potatoes, and fried chicken. Pt wife reports "you'd never know he'd ate that much by how he looks. He can lose weight by eating".   Pt underwent cardiac surgery in October 2020 and  lost weight during recovery. Pt wife recalls that his UBW is around 125# (which has has weighed for the entiretly of their relationship over two decades). After hospitalization, pt was down to 109#, but regained lost weight by consuming 2-3 Ensure supplements daily. He has regained to his UBW and maintained his UBW over the past two month and usually only consumes 1-2 Ensures per week.   Discussed with pt wife rationale for full liquid diet and items offered on it. She is amenable to Ensure supplements during hospitalization due to diet restriction and prior NPO order.   Medications reviewed and include dextrose 5% and 0.2% NaCl infusion @ 100 ml/hr.   Labs reviewed.   NUTRITION - FOCUSED PHYSICAL EXAM:    Most Recent Value  Orbital Region  Moderate depletion  Upper Arm Region  Severe depletion  Thoracic and Lumbar Region  Moderate depletion  Buccal Region  Moderate depletion  Temple Region  Moderate depletion  Clavicle Bone Region  Severe depletion  Clavicle and Acromion Bone Region  Severe depletion  Scapular Bone Region  Severe depletion  Dorsal Hand  Moderate depletion  Patellar Region  Severe depletion  Anterior Thigh Region  Severe depletion  Posterior Calf Region  Severe depletion  Edema (RD Assessment)  None  Hair  Reviewed  Eyes  Reviewed  Mouth  Reviewed  Skin  Reviewed  Nails  Reviewed       Diet Order:   Diet Order            Diet full  liquid Room service appropriate? Yes; Fluid consistency: Thin  Diet effective now              EDUCATION NEEDS:   Education needs have been addressed  Skin:  Skin Assessment: Reviewed RN Assessment  Last BM:  09/30/19  Height:   Ht Readings from Last 1 Encounters:  09/30/19 _0  (1.727 m)    Weight:   Wt Readings from Last 1 Encounters:  09/30/19 54.9 kg    Ideal Body Weight:  63.6 kg  BMI:  Body mass index is 18.4 kg/m.  Estimated Nutritional Needs:   Kcal:  2000-2200  Protein:  105-110 grams  Fluid:   > 2 L    Loistine Chance, RD, LDN, [place] Registered Dietitian II Certified Diabetes Care and Education Specialist Please refer to [facility] for RD and/or RD on-call/weekend/after hours pager

## 2019-10-01 NOTE — Progress Notes (Signed)
Pt NGT removed, tolerated well. Pt now enjoying full liquid diet, will continue to monitor for complications.

## 2019-10-01 NOTE — Progress Notes (Signed)
Pt had several episodes of emesis for which Zofran did not help. This RN spoke with attending and attempted to reach out to [place] [facility], which did not answer. Will continue to monitor.

## 2019-10-01 NOTE — Progress Notes (Signed)
PROGRESS NOTE  [name]:[id number] DOB: 1960/05/09   PCP: [name], MD  Patient is from: Home.  DOA: 09/28/2019 LOS: 3  Brief Narrative / Interim history: 59 year old male with history of systolic CHF, scleroderma, HTN, CAD/PCI, HLD and malnutrition presenting with 2 days of nausea and vomiting and found to have small bowel obstruction.  He was managed conservatively with NG tube bowel decompression and IV fluid with improvement in symptoms.  NG tube discontinued on 10/01/2019.  Cleared for discharge by general surgery from surgical standpoint.  Hospital course complicated by mild hypernatremia and AKI.  On IV fluid.  Subjective: Seen and examined earlier this morning.  No major events overnight of this morning.  No complaint this morning.  Reports some bowel movements overnight.  Passing gases.  Denies nausea, vomiting or abdominal pain.  Denies chest pain, dyspnea or UTI symptoms.  Objective: Vitals:   10/01/19 0410 10/01/19 0748 10/01/19 0809 10/01/19 1518  BP: (!) 152/85 (!) 167/76 (!) 161/89 (!) 148/88  Pulse: 70 65 65 70  Resp: _0 Temp: 98.2 F (36.8 C) (!) 97.5 F (36.4 C) 97.6 F (36.4 C) 98.5 F (36.9 C)  TempSrc: Oral Oral Oral Oral  SpO2: 100% 99%    Weight:      Height:        Intake/Output Summary (Last 24 hours) at 10/01/2019 1728 Last data filed at 10/01/2019 1600 Gross per 24 hour  Intake 1042.18 ml  Output 1500 ml  Net -457.82 ml   Filed Weights   09/30/19 0437  Weight: 54.9 kg    Examination:  GENERAL: No apparent distress.  Nontoxic. HEENT: MMM.  Vision and hearing grossly intact.  NECK: Supple.  No apparent JVD.  RESP: On room air.  No IWOB.  Fair aeration bilaterally. CVS:  RRR. Heart sounds normal.  ABD/GI/GU: BS+. Abd soft, NTND.  MSK/EXT:  Moves extremities. No apparent deformity. No edema.  SKIN: no apparent skin lesion or wound NEURO: Awake, alert and oriented appropriately.  No apparent focal neuro  deficit. PSYCH: Calm. Normal affect.  Procedures:  6/6-6/9 NG tube for SBO  Microbiology summarized: COVID-19 PCR negative.  Assessment & Plan: Small bowel obstruction with marked large bowel constipation-resolved. -Tolerating diet and having bowel movements. -Cleared for discharge by general surgery  AKI on CKD-3B with azotemia: Cr plateaued at 4.52.  BUN 83.  No signs of uremia. -Continue IV fluid -Avoid nephrotoxic meds -Recheck in the morning  Hypernatremia: Na 149. -Change fluids to D5-1/4NS-KCl -Continue monitoring  Scleroderma-stable.  Followed by Dr. Estanislado Pandy -Continue home Norvasc, sildenafil  History of CAD s/p PCA-no anginal symptoms.  Followed by cardiology, Dr. Alvester Chou -Continue home medications  Hypomagnesemia: Resolved.  Normocytic anemia: Baseline Hgb 9-10.  H&H relatively stable. -Check anemia panel in the morning  Debility/physical deconditioning due to acute illness and hospitalization -PT/OT eval  Severe malnutrition:Body mass index is 18.4 kg/m.  Significant muscle mass and subcu fat loss. -Appreciate dietitian input Nutrition Problem: Severe Malnutrition Etiology: chronic illness(CHF, scleroderma) Signs/Symptoms: moderate fat depletion, severe fat depletion, moderate muscle depletion, severe muscle depletion Interventions: Ensure Enlive (each supplement provides 350kcal and 20 grams of protein), Magic cup, MVI   DVT prophylaxis: Subcu heparin Code Status: Full code Family Communication: Updated patient's wife at bedside. Status is: Inpatient  Remains inpatient appropriate because:Persistent severe electrolyte disturbances, IV treatments appropriate due to intensity of illness or inability to take PO and Inpatient level of care appropriate due to severity of illness  Dispo: The patient is from: Home              Anticipated d/c is to: Home              Anticipated d/c date is: 1 day              Patient currently is not medically stable to  d/c.       Consultants:  General surgery   Sch Meds:  Scheduled Meds: . feeding supplement (ENSURE ENLIVE)  237 mL Oral TID BM  . heparin  5,000 Units Subcutaneous Q8H  . multivitamin with minerals  1 tablet Oral Daily  . sodium chloride flush  3 mL Intravenous Once   Continuous Infusions: . dextrose 5 % and 0.2 % NaCl 50 mL/hr at 10/01/19 1600   PRN Meds:.acetaminophen, bisacodyl, labetalol, ondansetron **OR** ondansetron (ZOFRAN) IV, oxyCODONE  Antimicrobials: Anti-infectives (From admission, onward)   None       I have personally reviewed the following labs and images: CBC: Recent Labs  Lab 09/28/19 1310 09/29/19 0425 09/30/19 0358 10/01/19 0358  WBC 5.6 4.7 4.8 5.0  HGB 9.9* 9.2* 9.8* 9.5*  HCT 32.7* 30.5* 32.6* 31.5*  MCV 101.9* 100.0 100.9* 102.6*  PLT 193 166 190 176   BMP &GFR Recent Labs  Lab 09/28/19 1310 09/29/19 0425 09/29/19 1159 09/30/19 0358 10/01/19 0358 10/01/19 1301  NA 145 145  --  147* 149* 144  K 4.0 4.0  --  4.3 4.2 4.0  CL 108 110  --  108 115* 111  CO2 21* 20*  --  21* 20* 22  GLUCOSE 122* 101*  --  100* 83 107*  BUN 51* 58*  --  73* 83* 78*  CREATININE 3.05* 3.53*  --  4.53* 4.52* 4.01*  CALCIUM 8.5* 7.9*  --  7.9* 8.3* 8.3*  MG  --   --  1.0*  --  3.0*  --    Estimated Creatinine Clearance: 15.2 mL/min (A) (by C-G formula based on SCr of 4.01 mg/dL (H)). Liver & Pancreas: Recent Labs  Lab 09/28/19 1310 09/29/19 0425 09/30/19 0358  AST _0 ALT _1 ALKPHOS 77 66 67  BILITOT 0.6 0.4 0.4  PROT 8.0 7.0 7.0  ALBUMIN 4.2 3.7 3.9   Recent Labs  Lab 09/28/19 1310  LIPASE 40   No results for input(s): AMMONIA in the last 168 hours. Diabetic: No results for input(s): HGBA1C in the last 72 hours. No results for input(s): GLUCAP in the last 168 hours. Cardiac Enzymes: No results for input(s): CKTOTAL, CKMB, CKMBINDEX, TROPONINI in the last 168 hours. No results for input(s): PROBNP in the last 8760  hours. Coagulation Profile: No results for input(s): INR, PROTIME in the last 168 hours. Thyroid Function Tests: No results for input(s): TSH, T4TOTAL, FREET4, T3FREE, THYROIDAB in the last 72 hours. Lipid Profile: No results for input(s): CHOL, HDL, LDLCALC, TRIG, CHOLHDL, LDLDIRECT in the last 72 hours. Anemia Panel: No results for input(s): VITAMINB12, FOLATE, FERRITIN, TIBC, IRON, RETICCTPCT in the last 72 hours. Urine analysis:    Component Value Date/Time   COLORURINE YELLOW 02/07/2019 0847   APPEARANCEUR CLEAR 02/07/2019 0847   LABSPEC 1.008 02/07/2019 0847   PHURINE 5.0 02/07/2019 0847   GLUCOSEU NEGATIVE 02/07/2019 0847   HGBUR NEGATIVE 02/07/2019 [place] 02/07/2019 [place] 02/07/2019 0847   PROTEINUR 30 (A) 02/07/2019 0847   NITRITE NEGATIVE 02/07/2019 0847   LEUKOCYTESUR  NEGATIVE 02/07/2019 0847   Sepsis Labs: Invalid input(s): PROCALCITONIN, [place]  Microbiology: Recent Results (from the past 240 hour(s))  SARS Coronavirus 2 by RT PCR (hospital order, performed in [facility] hospital lab) Nasopharyngeal Nasopharyngeal Swab     Status: None   Collection Time: 09/28/19  4:59 PM   Specimen: Nasopharyngeal Swab  Result Value Ref Range Status   SARS Coronavirus 2 NEGATIVE NEGATIVE Final    Comment: (NOTE) SARS-CoV-2 target nucleic acids are NOT DETECTED. The SARS-CoV-2 RNA is generally detectable in upper and lower respiratory specimens during the acute phase of infection. The lowest concentration of SARS-CoV-2 viral copies this assay can detect is 250 copies / mL. A negative result does not preclude SARS-CoV-2 infection and should not be used as the sole basis for treatment or other patient management decisions.  A negative result Skinner occur with improper specimen collection / handling, submission of specimen other than nasopharyngeal swab, presence of viral mutation(s) within the areas targeted by this assay, and  inadequate number of viral copies (<250 copies / mL). A negative result must be combined with clinical observations, patient history, and epidemiological information. Fact Sheet for Patients:   StrictlyIdeas.no Fact Sheet for Healthcare Providers: BankingDealers.co.za This test is not yet approved or cleared  by the [place] FDA and has been authorized for detection and/or diagnosis of SARS-CoV-2 by FDA under an Emergency Use Authorization (EUA).  This EUA will remain in effect (meaning this test can be used) for the duration of the COVID-19 declaration under Section 564(b)(1) of the Act, 21 U.S.C. section 360bbb-3(b)(1), unless the authorization is terminated or revoked sooner. Performed at [facility], [place] [address], [place], [place] [address]     Radiology Studies: No results found.    Miron Marxen T. [place]  If 7PM-7AM, please contact night-coverage [email or web address] Password [facility] 10/01/2019, 5:28 PM
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
## 2019-10-01 NOTE — Plan of Care (Signed)

## 2019-10-02 ENCOUNTER — Ambulatory Visit: Payer: 59 | Admitting: Gastroenterology

## 2019-10-02 ENCOUNTER — Inpatient Hospital Stay (HOSPITAL_COMMUNITY): Payer: 59

## 2019-10-02 LAB — RETICULOCYTES
Immature Retic Fract: 9 % (ref 2.3–15.9)
RBC.: 3.54 MIL/uL — ABNORMAL LOW (ref 4.22–5.81)
Retic Count, Absolute: 54.2 10*3/uL (ref 19.0–186.0)
Retic Ct Pct: 1.5 % (ref 0.4–3.1)

## 2019-10-02 LAB — RENAL FUNCTION PANEL
Albumin: 3.7 g/dL (ref 3.5–5.0)
Anion gap: 17 — ABNORMAL HIGH (ref 5–15)
BUN: 82 mg/dL — ABNORMAL HIGH (ref 6–20)
CO2: 22 mmol/L (ref 22–32)
Calcium: 8.8 mg/dL — ABNORMAL LOW (ref 8.9–10.3)
Chloride: 104 mmol/L (ref 98–111)
Creatinine, Ser: 4.19 mg/dL — ABNORMAL HIGH (ref 0.61–1.24)
GFR calc Af Amer: 17 mL/min — ABNORMAL LOW (ref >60)
GFR calc non Af Amer: 14 mL/min — ABNORMAL LOW (ref >60)
Glucose, Bld: 99 mg/dL (ref 70–99)
Phosphorus: 4.8 mg/dL — ABNORMAL HIGH (ref 2.5–4.6)
Potassium: 3.5 mmol/L (ref 3.5–5.1)
Sodium: 143 mmol/L (ref 135–145)

## 2019-10-02 LAB — IRON AND TIBC
Iron: 26 ug/dL — ABNORMAL LOW (ref 45–182)
Saturation Ratios: 10 % — ABNORMAL LOW (ref 17.9–39.5)
TIBC: 266 ug/dL (ref 250–450)
UIBC: 240 ug/dL

## 2019-10-02 LAB — MAGNESIUM: Magnesium: 2.7 mg/dL — ABNORMAL HIGH (ref 1.7–2.4)

## 2019-10-02 LAB — CBC
HCT: 32.9 % — ABNORMAL LOW (ref 39.0–52.0)
Hemoglobin: 10 g/dL — ABNORMAL LOW (ref 13.0–17.0)
MCH: 30.2 pg (ref 26.0–34.0)
MCHC: 30.4 g/dL (ref 30.0–36.0)
MCV: 99.4 fL (ref 80.0–100.0)
Platelets: 206 10*3/uL (ref 150–400)
RBC: 3.31 MIL/uL — ABNORMAL LOW (ref 4.22–5.81)
RDW: 15.7 % — ABNORMAL HIGH (ref 11.5–15.5)
WBC: 5.4 10*3/uL (ref 4.0–10.5)
nRBC: 0 % (ref 0.0–0.2)

## 2019-10-02 LAB — VITAMIN B12: Vitamin B-12: 277 pg/mL (ref 180–914)

## 2019-10-02 LAB — FOLATE: Folate: 16.1 ng/mL (ref >5.9)

## 2019-10-02 LAB — FERRITIN: Ferritin: 603 ng/mL — ABNORMAL HIGH (ref 24–336)

## 2019-10-02 IMAGING — DX DG ABD PORTABLE 1V
1 series · 1 of 1 positions shown · non-contrast
Comparison: [DATE] and prior exams

CLINICAL DATA: Follow-up small bowel obstruction

EXAM:
PORTABLE ABDOMEN - 1 VIEW

[abdomen]
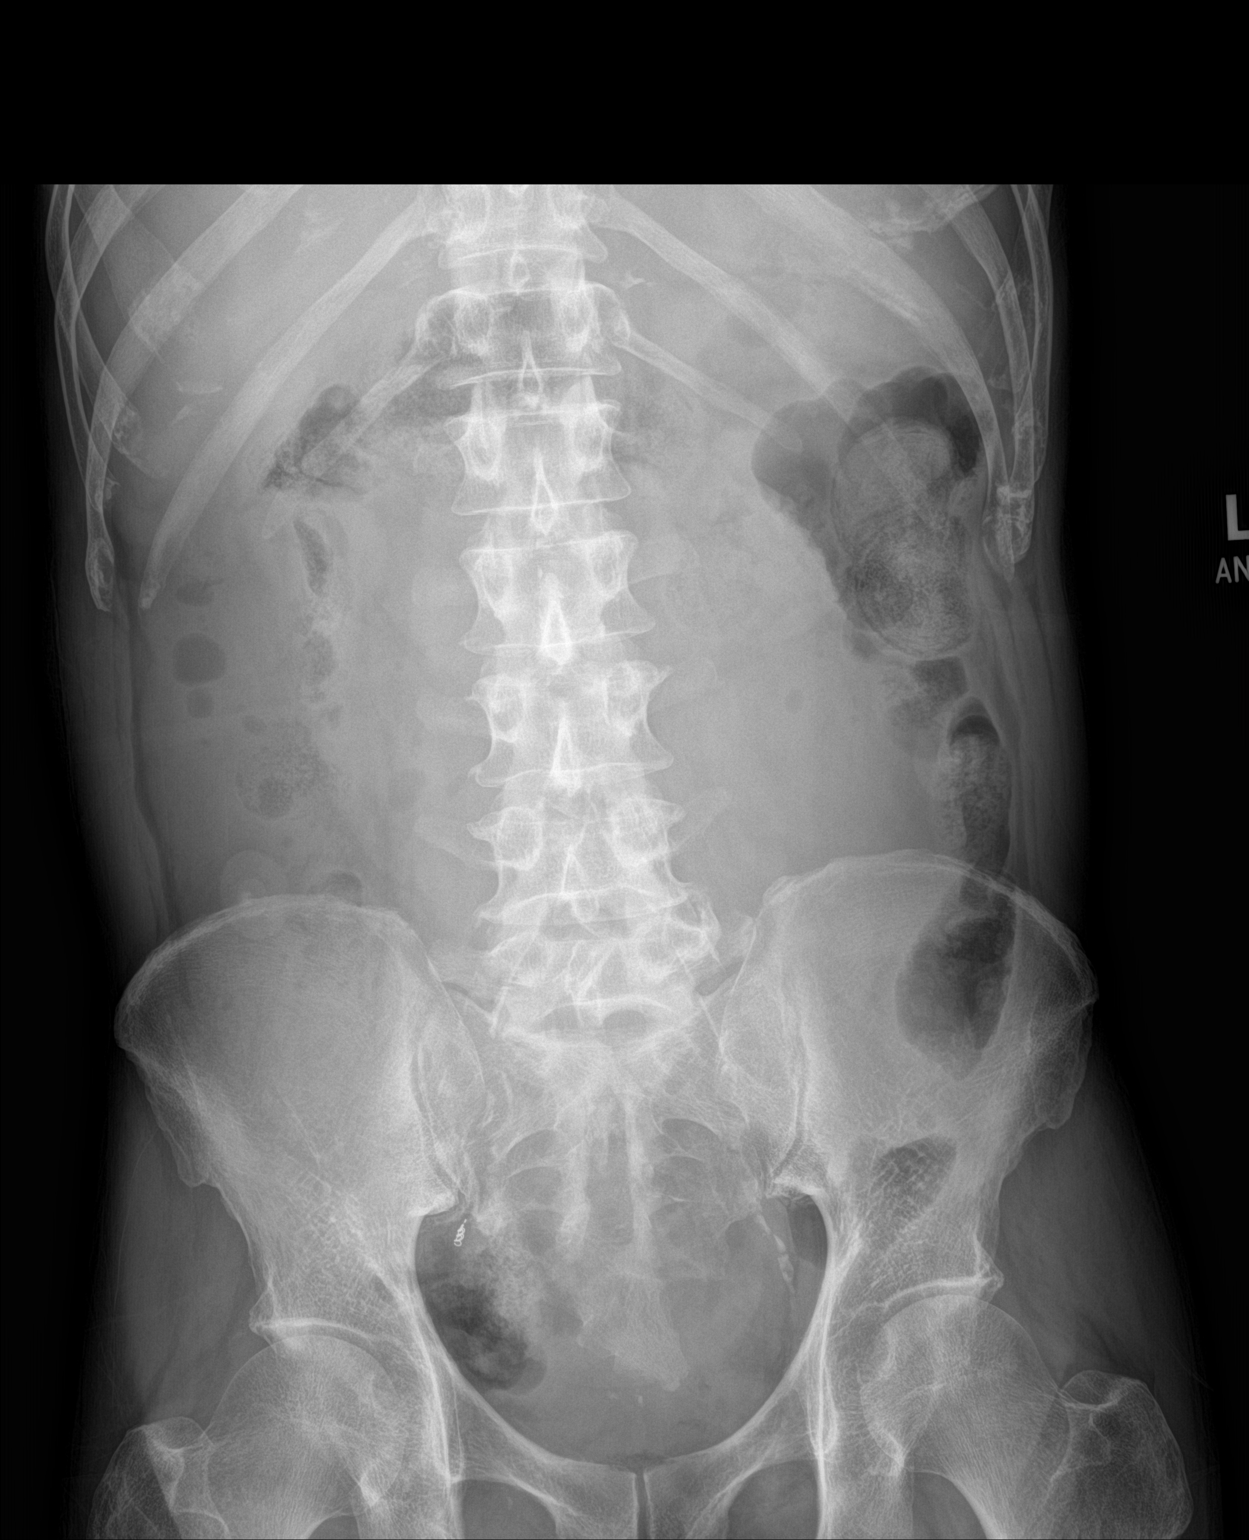

[1 of 1 positions shown; findings below may reference images not displayed]

FINDINGS: No bowel dilatation is identified.

A small to moderate amount of stool in the colonic hepatic flexure
noted.

No acute abnormalities are identified.

NG tube is not visualized.
IMPRESSION: No dilated bowel loops identified.

## 2019-10-02 MED ORDER — POLYETHYLENE GLYCOL 3350 17 G PO PACK
17.0000 g | PACK | Freq: Two times a day (BID) | ORAL | Status: DC
  Administered 2019-10-02: 17 g via ORAL
  Filled 2019-10-02 (×3): qty 1

## 2019-10-02 MED ORDER — PROCHLORPERAZINE EDISYLATE 10 MG/2ML IJ SOLN
10.0000 mg | Freq: Four times a day (QID) | INTRAMUSCULAR | Status: DC | PRN
  Administered 2019-10-02 – 2019-10-03 (×3): 10 mg via INTRAVENOUS
  Filled 2019-10-02 (×3): qty 2

## 2019-10-02 NOTE — Progress Notes (Signed)
Pt received tap water enema, tolerated well. Pt then wanted to use bathroom, no luck yet. Will continue to monitor and try again soon.

## 2019-10-02 NOTE — Progress Notes (Signed)
KUB with no dilated bowel. Will start scheduled MiraLAX twice daily.  If no improvement, tapwater enema.  Encourage mobilization.  Discussed with RN.

## 2019-10-02 NOTE — Evaluation (Signed)
Physical Therapy Evaluation Patient Details Name: [name] MRN: [id number] DOB: [birth year] Today's Date: 10/02/2019   History of Present Illness  Pt is a 59 year old man admitted on 09/28/19 with N/V due to SBO. NGT placed, removed 10/01/19 with resumption of vomiting. Hospital course complicated by mild hypernatremia and AKI. PMH: CHF, scleroderma, HTN CAD/PCI, malnutrition.  Clinical Impression  Pt with continued discomfort from emesis; x 2 episodes during PT session. However, pt agreeable to mobility, ambulating 250 feet at a supervision level. Worked on sit to stands for functional strength training. Noted mild dynamic balance deficits and decreased endurance. Will continue to follow acutely to promote ambulation. No PT needs anticipated.     Follow Up Recommendations No PT follow up    Equipment Recommendations  None recommended by PT    Recommendations for Other Services       Precautions / Restrictions Precautions Precautions: None Restrictions Weight Bearing Restrictions: No      Mobility  Bed Mobility Overal bed mobility: Modified Independent             General bed mobility comments: HOB up  Transfers Overall transfer level: Needs assistance Equipment used: None Transfers: Sit to/from Stand Sit to Stand: Supervision         General transfer comment: supervised for safety and IV line  Ambulation/Gait Ambulation/Gait assistance: Supervision Gait Distance (Feet): 250 Feet Assistive device: None Gait Pattern/deviations: Step-through pattern;Decreased stride length Gait velocity: decreased   General Gait Details: Slow pace, no overt LOB. Occasionally reaching for external support  Stairs            Wheelchair Mobility    Modified Rankin (Stroke Patients Only)       Balance Overall balance assessment: Mild deficits observed, not formally tested                                           Pertinent Vitals/Pain Pain  Assessment: No/denies pain    Home Living Family/patient expects to be discharged to:: Private residence Living Arrangements: Spouse/significant other;Children (teenage daughter) Available Help at Discharge: Family;Available 24 hours/day Type of Home: House Home Access: Stairs to enter Entrance Stairs-Rails: Right Entrance Stairs-Number of Steps: 3-4 Home Layout: One level Home Equipment: Bedside commode;Crutches (walking sticks) Additional Comments: wife is disabled    Prior Function Level of Independence: Independent         Comments: Does not work.     Hand Dominance   Dominant Hand: Right    Extremity/Trunk Assessment   Upper Extremity Assessment Upper Extremity Assessment: Overall WFL for tasks assessed    Lower Extremity Assessment Lower Extremity Assessment: Overall WFL for tasks assessed       Communication   Communication: No difficulties  Cognition Arousal/Alertness: Awake/alert Behavior During Therapy: Flat affect Overall Cognitive Status: Within Functional Limits for tasks assessed                                        General Comments      Exercises Other Exercises Other Exercises: x5 Sit to stands for functional strength training   Assessment/Plan    PT Assessment Patient needs continued PT services  PT Problem List Decreased strength;Decreased activity tolerance;Decreased balance;Decreased mobility       PT Treatment Interventions Gait training;Stair training;Functional  mobility training;Therapeutic activities;Therapeutic exercise;Balance training;Patient/family education    PT Goals (Current goals can be found in the Care Plan section)  Acute Rehab PT Goals Patient Stated Goal: stop vomiting PT Goal Formulation: With patient Time For Goal Achievement: 10/16/19 Potential to Achieve Goals: Good    Frequency Min 3X/week   Barriers to discharge        Co-evaluation               AM-PAC PT "6 Clicks"  Mobility  Outcome Measure Help needed turning from your back to your side while in a flat bed without using bedrails?: None Help needed moving from lying on your back to sitting on the side of a flat bed without using bedrails?: None Help needed moving to and from a bed to a chair (including a wheelchair)?: None Help needed standing up from a chair using your arms (e.g., wheelchair or bedside chair)?: None Help needed to walk in hospital room?: None Help needed climbing 3-5 steps with a railing? : A Little 6 Click Score: 23    End of Session   Activity Tolerance: Patient tolerated treatment well Patient left: in bed;with call bell/phone within reach;with family/visitor present   PT Visit Diagnosis: Unsteadiness on feet (R26.81)    Time: 4765-4650 PT Time Calculation (min) (ACUTE ONLY): 14 min   Charges:   PT Evaluation $PT Eval Low Complexity: 1 Low            Wyona Almas, PT, DPT Acute Rehabilitation Services Pager [phone] Office [phone]   Deno Etienne 10/02/2019, 5:17 PM

## 2019-10-02 NOTE — Plan of Care (Signed)
  Problem: Education: Goal: Knowledge of General Education information will improve Description: Including pain rating scale, medication(s)/side effects and non-pharmacologic comfort measures Outcome: Progressing   Problem: Health Behavior/Discharge Planning: Goal: Ability to manage health-related needs will improve Outcome: Progressing   Problem: Clinical Measurements: Goal: Cardiovascular complication will be avoided Outcome: Progressing   Problem: Activity: Goal: Risk for activity intolerance will decrease Outcome: Progressing   Problem: Coping: Goal: Level of anxiety will decrease Outcome: Progressing

## 2019-10-02 NOTE — Progress Notes (Addendum)
Able to get in contact with [facility] on call regarding pt having multiple episodes of emesis after NG tube was removed. Got telephone order for NG tube to be placed back.   Educated pt on order, pt is refusing at this moment and stated that he will try to increase ambulation to decrease episodes of emesis and nausea and if that doesn't work then he is okay having NG tube placed again.  Pt also states that he emesis episodes has decreased a bit and not as bad as yesterday. Will continue to monitor pt and give PRN Zofran as needed

## 2019-10-02 NOTE — Progress Notes (Signed)
PROGRESS NOTE  [name] TZG:[id number] DOB: 1960-08-20   PCP: [name], MD  Patient is from: Home.  DOA: 09/28/2019 LOS: 4  Brief Narrative / Interim history: 59 year old male with history of systolic CHF, scleroderma, HTN, CAD/PCI, HLD and malnutrition presenting with 2 days of nausea and vomiting and found to have small bowel obstruction.  He was managed conservatively with NG tube bowel decompression and IV fluid with improvement in symptoms.  NG tube discontinued on 10/01/2019.  Cleared for discharge by general surgery from surgical standpoint. However, patient has significant emesis after NG tube removal.  General surgery recommended reinserting NGT but patient refused.   Hospital course complicated by mild hypernatremia and AKI.  On IV fluid.  Subjective: Seen and examined earlier this morning.  Continues to have emesis.  He has not had bowel movement in over 24 hours.  Denies passing gas.  Denies chest pain or dyspnea.  Would like to avoid NG tube if possible.  Ambulating in the hallway.  Objective: Vitals:   10/01/19 1941 10/02/19 0500 10/02/19 0513 10/02/19 0812  BP: (!) 152/79  (!) 133/91 130/90  Pulse: 74  76 77  Resp: _0 Temp: 98.2 F (36.8 C)  98.9 F (37.2 C) 98.3 F (36.8 C)  TempSrc: Oral  Oral Oral  SpO2: 100%  100% 99%  Weight:  55.3 kg    Height:        Intake/Output Summary (Last 24 hours) at 10/02/2019 1200 Last data filed at 10/02/2019 0900 Gross per 24 hour  Intake 1162.18 ml  Output 700 ml  Net 462.18 ml   Filed Weights   09/30/19 0437 10/02/19 0500  Weight: 54.9 kg 55.3 kg    Examination:  GENERAL: No apparent distress.  Nontoxic. HEENT: MMM.  Vision and hearing grossly intact.  NECK: Supple.  No apparent JVD.  RESP:  No IWOB.  Fair aeration bilaterally. CVS:  RRR. Heart sounds normal.  ABD/GI/GU: BS diminished. Abd soft, NTND.  MSK/EXT:  Moves extremities.  Significant muscle mass and subcu fat loss. SKIN: no  apparent skin lesion or wound NEURO: Awake, alert and oriented appropriately.  No apparent focal neuro deficit. PSYCH: Calm. Normal affect.   Procedures:  6/6-6/9 NG tube for SBO  Microbiology summarized: COVID-19 PCR negative.  Assessment & Plan: Small bowel obstruction with marked large bowel constipation-has had frequent emesis after NG tube removal.  General surgery recommended restarting NG tube but patient refusing.  Has some azotemia which might contribute to his emesis. -Check abdominal x-ray -May have to downgrade diet  AKI on CKD-3B with azotemia: Baseline Cr 2.7-3.0 > 3.05 (admit)> 4.53 (peak)>> 4.19.  BUN 51 >83. -Increase IV fluid to 75 cc an hour -Avoid nephrotoxic meds -Recheck in the morning  Hypernatremia: Na 149> 143.  Resolved. -Change fluids to D5-1/4NS-KCl -Continue monitoring  Scleroderma-stable.  Followed by Dr. Estanislado Pandy -Continue home Norvasc, sildenafil  History of CAD s/p PCA-no anginal symptoms.  Followed by cardiology, Dr. [name] -Continue home medications  Hypomagnesemia: Resolved.  Normocytic anemia: Baseline Hgb 9-10.  H&H relatively stable. -Check anemia panel -Monitor H&H intermittently  Debility/physical deconditioning due to acute illness and hospitalization -PT/OT eval  Severe malnutrition:Body mass index is 18.54 kg/m.  Significant muscle mass and subcu fat loss. -Appreciate dietitian input Nutrition Problem: Severe Malnutrition Etiology: chronic illness (CHF, scleroderma) Signs/Symptoms: moderate fat depletion, severe fat depletion, moderate muscle depletion, severe muscle depletion Interventions: Ensure Enlive (each supplement provides 350kcal and 20 grams of protein), Magic  cup, MVI   DVT prophylaxis: Subcu heparin Code Status: Full code Family Communication: Updated patient's daughter at bedside. Status is: Inpatient  Remains inpatient appropriate because:IV treatments appropriate due to intensity of illness or inability to  take PO, Inpatient level of care appropriate due to severity of illness and Ongoing emesis   Dispo: The patient is from: Home              Anticipated d/c is to: Home              Anticipated d/c date is: 1 day              Patient currently is not medically stable to d/c.       Consultants:  General surgery   Sch Meds:  Scheduled Meds: . feeding supplement (ENSURE ENLIVE)  237 mL Oral TID BM  . heparin  5,000 Units Subcutaneous Q8H  . multivitamin with minerals  1 tablet Oral Daily  . sodium chloride flush  3 mL Intravenous Once   Continuous Infusions: . dextrose 5 % and 0.2 % NaCl 75 mL/hr at 10/02/19 0740   PRN Meds:.acetaminophen, bisacodyl, labetalol, ondansetron **OR** ondansetron (ZOFRAN) IV, oxyCODONE  Antimicrobials: Anti-infectives (From admission, onward)   None       I have personally reviewed the following labs and images: CBC: Recent Labs  Lab 09/28/19 1310 09/29/19 0425 09/30/19 0358 10/01/19 0358 10/02/19 0403  WBC 5.6 4.7 4.8 5.0 5.4  HGB 9.9* 9.2* 9.8* 9.5* 10.0*  HCT 32.7* 30.5* 32.6* 31.5* 32.9*  MCV 101.9* 100.0 100.9* 102.6* 99.4  PLT 193 166 190 176 206   BMP &GFR Recent Labs  Lab 09/29/19 0425 09/29/19 1159 09/30/19 0358 10/01/19 0358 10/01/19 1301 10/02/19 0403  NA 145  --  147* 149* 144 143  K 4.0  --  4.3 4.2 4.0 3.5  CL 110  --  108 115* 111 104  CO2 20*  --  21* 20* 22 22  GLUCOSE 101*  --  100* 83 107* 99  BUN 58*  --  73* 83* 78* 82*  CREATININE 3.53*  --  4.53* 4.52* 4.01* 4.19*  CALCIUM 7.9*  --  7.9* 8.3* 8.3* 8.8*  MG  --  1.0*  --  3.0*  --  2.7*  PHOS  --   --   --   --   --  4.8*   Estimated Creatinine Clearance: 14.7 mL/min (A) (by C-G formula based on SCr of 4.19 mg/dL (H)). Liver & Pancreas: Recent Labs  Lab 09/28/19 1310 09/29/19 0425 09/30/19 0358 10/02/19 0403  AST _0 --   ALT _1 --   ALKPHOS 77 66 67  --   BILITOT 0.6 0.4 0.4  --   PROT 8.0 7.0 7.0  --   ALBUMIN 4.2 3.7 3.9  3.7   Recent Labs  Lab 09/28/19 1310  LIPASE 40   No results for input(s): AMMONIA in the last 168 hours. Diabetic: No results for input(s): HGBA1C in the last 72 hours. No results for input(s): GLUCAP in the last 168 hours. Cardiac Enzymes: No results for input(s): CKTOTAL, CKMB, CKMBINDEX, TROPONINI in the last 168 hours. No results for input(s): PROBNP in the last 8760 hours. Coagulation Profile: No results for input(s): INR, PROTIME in the last 168 hours. Thyroid Function Tests: No results for input(s): TSH, T4TOTAL, FREET4, T3FREE, THYROIDAB in the last 72 hours. Lipid Profile: No results for input(s): CHOL, HDL, LDLCALC, TRIG, CHOLHDL,  LDLDIRECT in the last 72 hours. Anemia Panel: No results for input(s): VITAMINB12, FOLATE, FERRITIN, TIBC, IRON, RETICCTPCT in the last 72 hours. Urine analysis:    Component Value Date/Time   COLORURINE YELLOW 02/07/2019 0847   APPEARANCEUR CLEAR 02/07/2019 0847   LABSPEC 1.008 02/07/2019 0847   PHURINE 5.0 02/07/2019 0847   GLUCOSEU NEGATIVE 02/07/2019 0847   HGBUR NEGATIVE 02/07/2019 [place] 02/07/2019 [place] 02/07/2019 0847   PROTEINUR 30 (A) 02/07/2019 0847   NITRITE NEGATIVE 02/07/2019 0847   LEUKOCYTESUR NEGATIVE 02/07/2019 0847   Sepsis Labs: Invalid input(s): PROCALCITONIN, [place]  Microbiology: Recent Results (from the past 240 hour(s))  SARS Coronavirus 2 by RT PCR (hospital order, performed in [facility] hospital lab) Nasopharyngeal Nasopharyngeal Swab     Status: None   Collection Time: 09/28/19  4:59 PM   Specimen: Nasopharyngeal Swab  Result Value Ref Range Status   SARS Coronavirus 2 NEGATIVE NEGATIVE Final    Comment: (NOTE) SARS-CoV-2 target nucleic acids are NOT DETECTED. The SARS-CoV-2 RNA is generally detectable in upper and lower respiratory specimens during the acute phase of infection. The lowest concentration of SARS-CoV-2 viral copies this assay can detect is  250 copies / mL. A negative result does not preclude SARS-CoV-2 infection and should not be used as the sole basis for treatment or other patient management decisions.  A negative result may occur with improper specimen collection / handling, submission of specimen other than nasopharyngeal swab, presence of viral mutation(s) within the areas targeted by this assay, and inadequate number of viral copies (<250 copies / mL). A negative result must be combined with clinical observations, patient history, and epidemiological information. Fact Sheet for Patients:   StrictlyIdeas.no Fact Sheet for Healthcare Providers: BankingDealers.co.za This test is not yet approved or cleared  by the [place] FDA and has been authorized for detection and/or diagnosis of SARS-CoV-2 by FDA under an Emergency Use Authorization (EUA).  This EUA will remain in effect (meaning this test can be used) for the duration of the COVID-19 declaration under Section 564(b)(1) of the Act, 21 U.S.C. section 360bbb-3(b)(1), unless the authorization is terminated or revoked sooner. Performed at [facility], [place] [address] [place], [place] [address]     Radiology Studies: No results found.    Marylen Zuk T. Butters  If 7PM-7AM, please contact night-coverage [email or web address] Password [email or web address] 10/02/2019, 12:00 PM

## 2019-10-02 NOTE — Evaluation (Signed)
Occupational Therapy Evaluation Patient Details Name: [name] MRN: [id number] DOB: 26-Apr-1960 Today's Date: 10/02/2019    History of Present Illness Pt is a 59 year old man admitted on 09/28/19 with N/V due to SBO. NGT placed, removed 10/01/19 with resumption of vomiting. Hospital course complicated by mild hypernatremia and AKI. PMH: CHF, scleroderma, HTN CAD/PCI, malnutrition.   Clinical Impression   Pt uncomfortable because of consistent vomiting, but overall functioning at a set up to supervision level. No OT needs. Wife is bedside with many questions about medical management, notified MD and RN.    Follow Up Recommendations  No OT follow up    Equipment Recommendations  None recommended by OT    Recommendations for Other Services       Precautions / Restrictions Precautions Precautions: None      Mobility Bed Mobility Overal bed mobility: Modified Independent             General bed mobility comments: HOB up  Transfers Overall transfer level: Needs assistance Equipment used: None Transfers: Sit to/from Stand Sit to Stand: Supervision         General transfer comment: supervised for safety and IV line    Balance Overall balance assessment: No apparent balance deficits (not formally assessed)                                         ADL either performed or assessed with clinical judgement   ADL                                         General ADL Comments: Overall functioning at a supervision level for safety and IV line.     Vision Patient Visual Report: No change from baseline       Perception     Praxis      Pertinent Vitals/Pain Pain Assessment: No/denies pain     Hand Dominance Right   Extremity/Trunk Assessment Upper Extremity Assessment Upper Extremity Assessment: Overall WFL for tasks assessed   Lower Extremity Assessment Lower Extremity Assessment: Defer to PT evaluation        Communication Communication Communication: No difficulties   Cognition Arousal/Alertness: Awake/alert Behavior During Therapy: Flat affect Overall Cognitive Status: Within Functional Limits for tasks assessed                                     General Comments       Exercises     Shoulder Instructions      Home Living Family/patient expects to be discharged to:: Private residence Living Arrangements: Spouse/significant other;Children (teenage daughter) Available Help at Discharge: Family;Available 24 hours/day Type of Home: House Home Access: Stairs to enter [employer] of Steps: 3-4 Entrance Stairs-Rails: Right Home Layout: One level     Bathroom Shower/Tub: Teacher, early years/pre: Handicapped height     Home Equipment: Bedside commode;Crutches (walking sticks)   Additional Comments: wife is disabled      Prior Functioning/Environment Level of Independence: Independent        Comments: Does not work.        OT Problem List:        OT Treatment/Interventions:  OT Goals(Current goals can be found in the care plan section) Acute Rehab OT Goals Patient Stated Goal: stop vomiting  OT Frequency:     Barriers to D/C:            Co-evaluation              AM-PAC OT "6 Clicks" Daily Activity     Outcome Measure Help from another person eating meals?: None Help from another person taking care of personal grooming?: None Help from another person toileting, which includes using toliet, bedpan, or urinal?: None Help from another person bathing (including washing, rinsing, drying)?: None Help from another person to put on and taking off regular upper body clothing?: None Help from another person to put on and taking off regular lower body clothing?: None 6 Click Score: 24   End of Session Nurse Communication: Other (comment) (wife with medical questions)  Activity Tolerance: Treatment limited secondary to  medical complications (Comment) (pt vomiting) Patient left: in bed;with call bell/phone within reach;with family/visitor present  OT Visit Diagnosis: Muscle weakness (generalized) (M62.81)                Time: 7048-8891 OT Time Calculation (min): 15 min Charges:  OT General Charges $OT Visit: 1 Visit OT Evaluation $OT Eval Low Complexity: 1 Low  Nestor Lewandowsky, OTR/L Acute Rehabilitation Services Pager: [phone] Office: [phone]  Malka So 10/02/2019, 3:52 PM

## 2019-10-03 ENCOUNTER — Inpatient Hospital Stay (HOSPITAL_COMMUNITY): Payer: 59

## 2019-10-03 DIAGNOSIS — R933 Abnormal findings on diagnostic imaging of other parts of digestive tract: Secondary | ICD-10-CM

## 2019-10-03 DIAGNOSIS — R1110 Vomiting, unspecified: Secondary | ICD-10-CM

## 2019-10-03 DIAGNOSIS — R935 Abnormal findings on diagnostic imaging of other abdominal regions, including retroperitoneum: Secondary | ICD-10-CM

## 2019-10-03 DIAGNOSIS — N179 Acute kidney failure, unspecified: Secondary | ICD-10-CM

## 2019-10-03 DIAGNOSIS — R112 Nausea with vomiting, unspecified: Secondary | ICD-10-CM

## 2019-10-03 DIAGNOSIS — R7989 Other specified abnormal findings of blood chemistry: Secondary | ICD-10-CM

## 2019-10-03 DIAGNOSIS — K56609 Unspecified intestinal obstruction, unspecified as to partial versus complete obstruction: Secondary | ICD-10-CM

## 2019-10-03 LAB — URINALYSIS, ROUTINE W REFLEX MICROSCOPIC
Glucose, UA: NEGATIVE mg/dL
Hgb urine dipstick: NEGATIVE
Ketones, ur: NEGATIVE mg/dL
Leukocytes,Ua: NEGATIVE
Nitrite: NEGATIVE
Protein, ur: 100 mg/dL — AB
Specific Gravity, Urine: 1.016 (ref 1.005–1.030)
pH: 5 (ref 5.0–8.0)

## 2019-10-03 LAB — RENAL FUNCTION PANEL
Albumin: 4.1 g/dL (ref 3.5–5.0)
Anion gap: 16 — ABNORMAL HIGH (ref 5–15)
BUN: 94 mg/dL — ABNORMAL HIGH (ref 6–20)
CO2: 27 mmol/L (ref 22–32)
Calcium: 9 mg/dL (ref 8.9–10.3)
Chloride: 93 mmol/L — ABNORMAL LOW (ref 98–111)
Creatinine, Ser: 7.32 mg/dL — ABNORMAL HIGH (ref 0.61–1.24)
GFR calc Af Amer: 9 mL/min — ABNORMAL LOW (ref >60)
GFR calc non Af Amer: 7 mL/min — ABNORMAL LOW (ref >60)
Glucose, Bld: 103 mg/dL — ABNORMAL HIGH (ref 70–99)
Phosphorus: 7 mg/dL — ABNORMAL HIGH (ref 2.5–4.6)
Potassium: 3.3 mmol/L — ABNORMAL LOW (ref 3.5–5.1)
Sodium: 136 mmol/L (ref 135–145)

## 2019-10-03 LAB — MAGNESIUM: Magnesium: 2.8 mg/dL — ABNORMAL HIGH (ref 1.7–2.4)

## 2019-10-03 IMAGING — DX DG ABD PORTABLE 1V
1 series · 1 of 1 positions shown · non-contrast
Comparison: [DATE] KUB and earlier.

CLINICAL DATA: 58-year-old male enteric tube placement.

EXAM:
PORTABLE ABDOMEN - 1 VIEW

[abdomen kub]
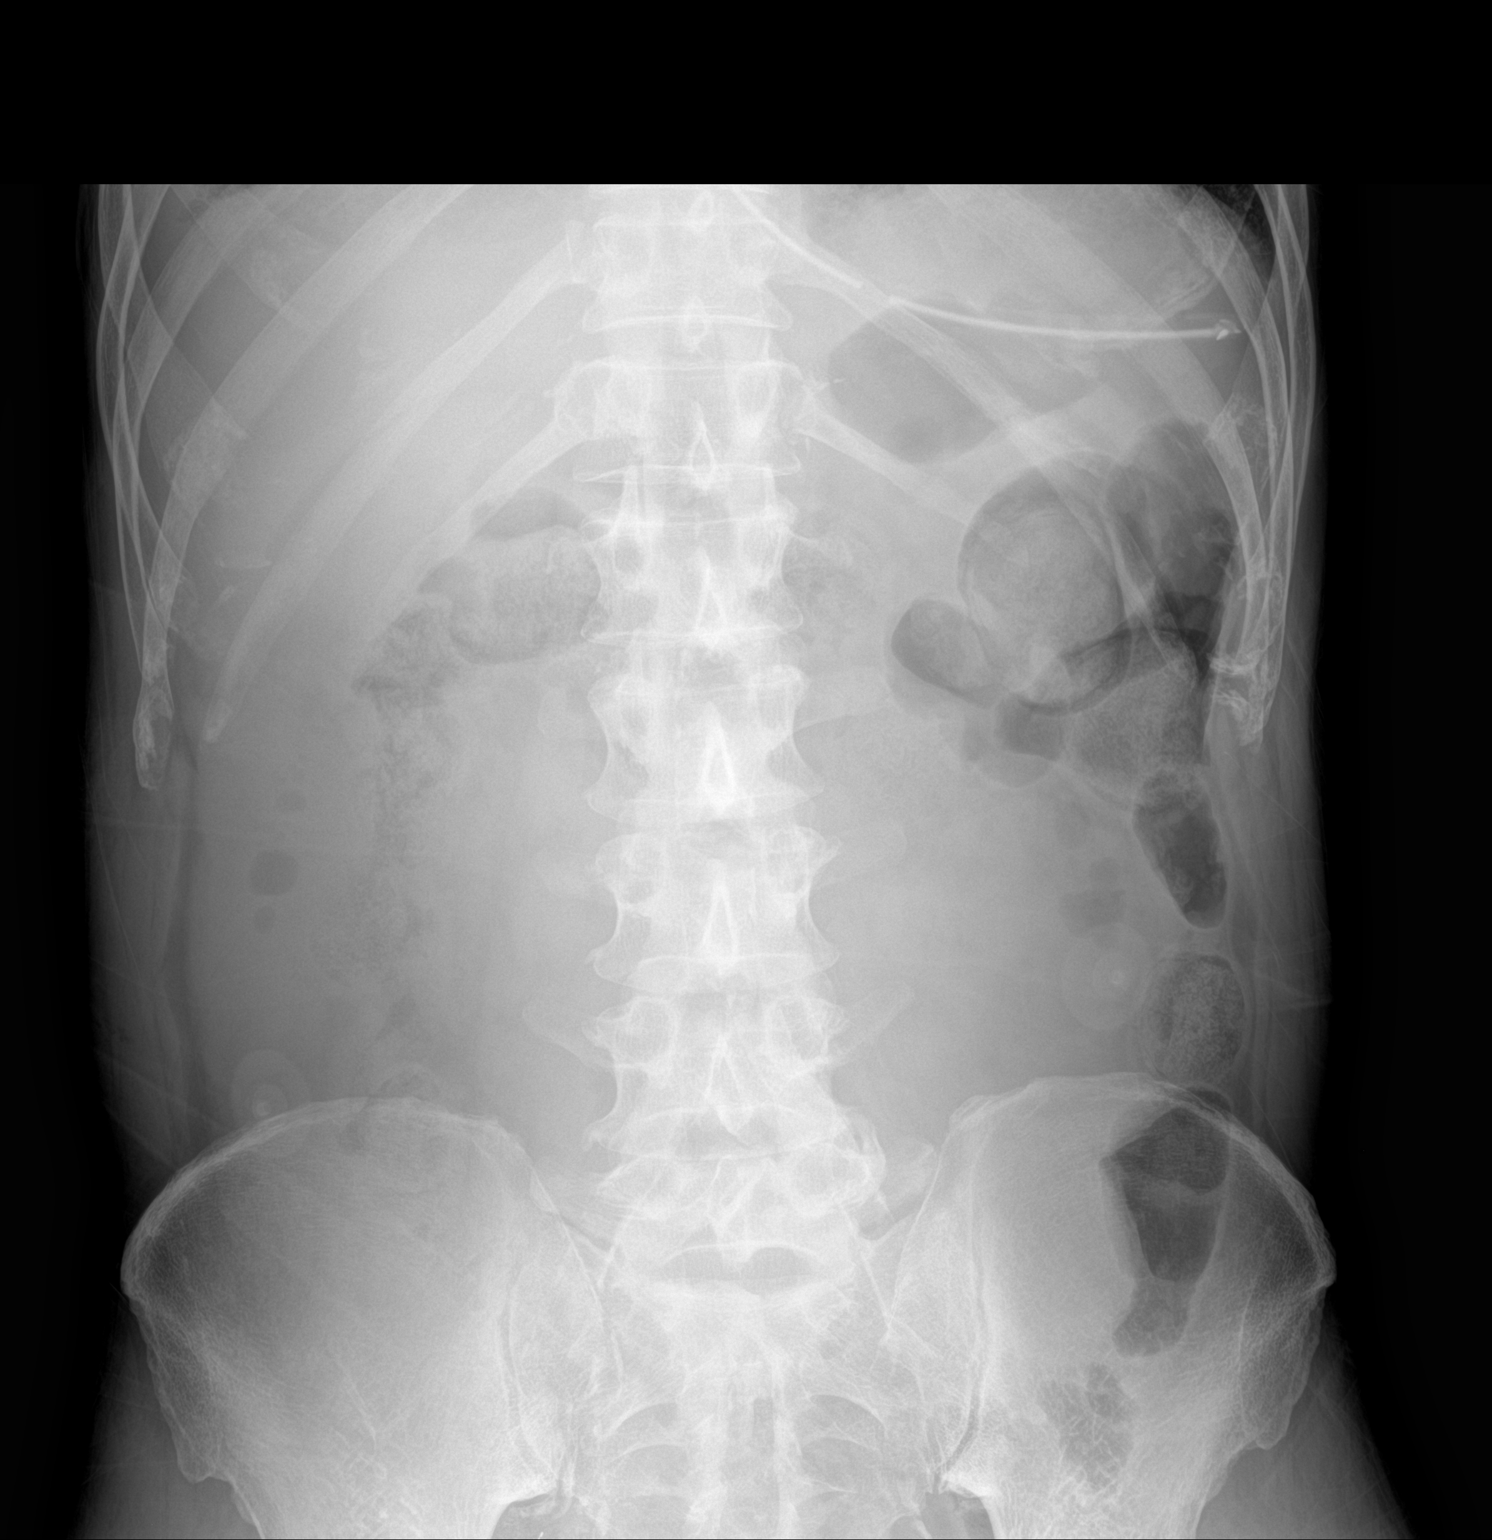

[1 of 1 positions shown; findings below may reference images not displayed]

FINDINGS: Portable AP supine view at [9I] hours. Enteric tube terminates in
the left upper quadrant, side hole at the level of the gastric
cardia. Bowel-gas pattern has not significantly changed from the CT
on [DATE], and might indicate continued fluid-filled small bowel
loops throughout the abdomen. Right pleural effusion is less
apparent. Lung bases appear stable. No acute osseous abnormality
identified.
IMPRESSION: 1. Enteric tube placed into the stomach, side hole at the level of
the gastric cardia.
2. Stable bowel gas pattern since [DATE], suspicious for ongoing
fluid-filled small bowel.

## 2019-10-03 IMAGING — US US RENAL
1 series · 14 of 25 positions shown · non-contrast
Comparison: Abdominal CT [DATE]

CLINICAL DATA: Acute kidney injury.

EXAM:
RENAL / URINARY TRACT ULTRASOUND COMPLETE

[Series 1: us renal · 14 of 36 slices shown]
[im 1/36]
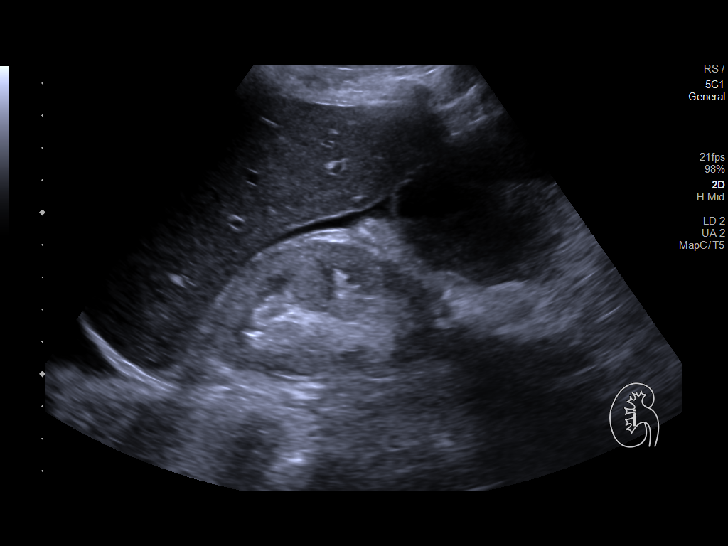
[im 3/36]
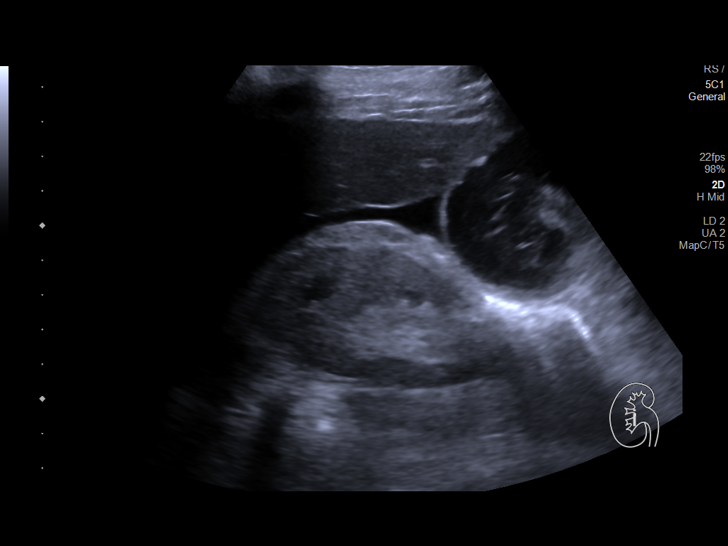
[im 6/36]
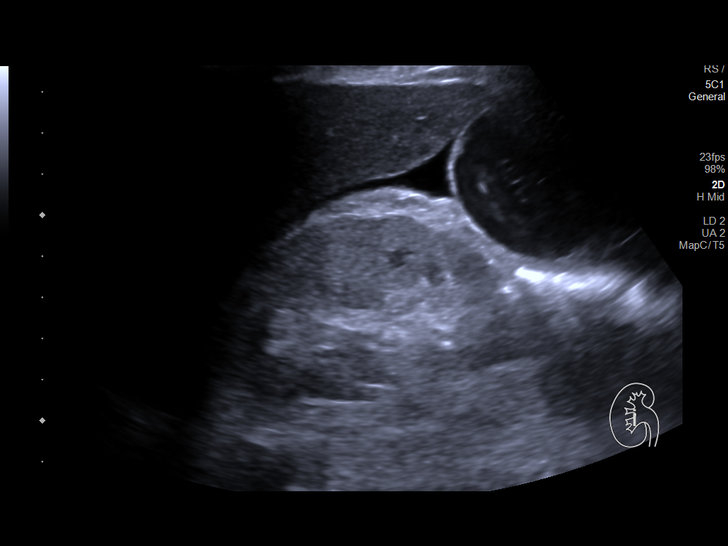
[im 9/36]
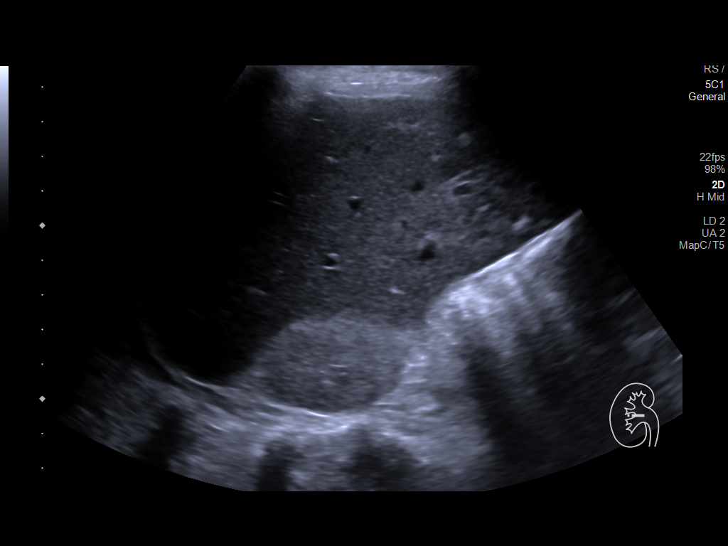
[im 12/36]
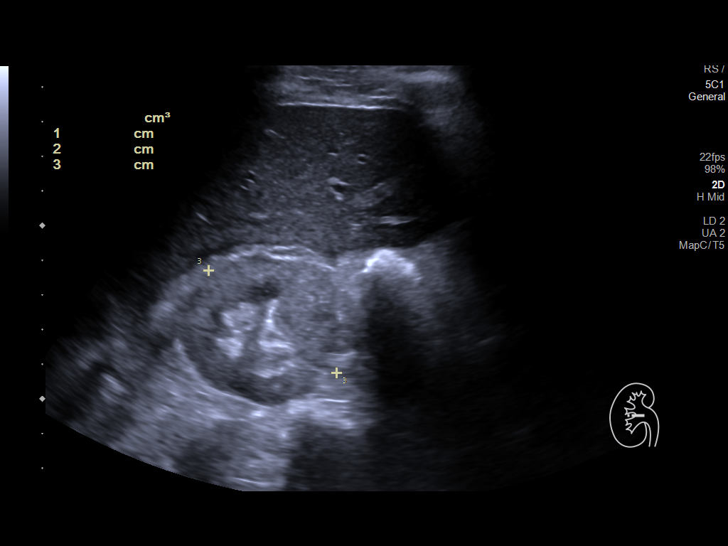
[im 14/36]
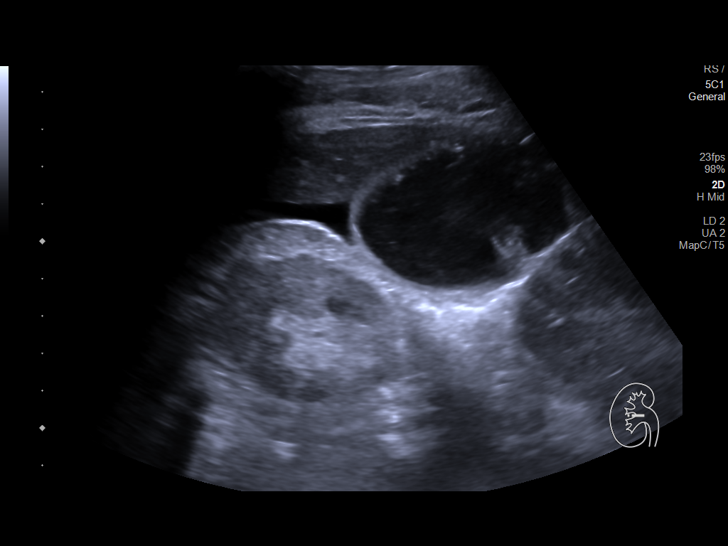
[im 17/36]
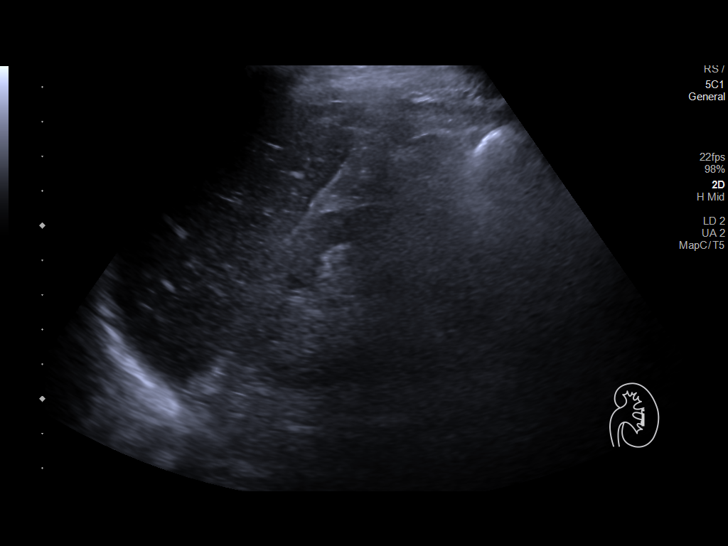
[im 19/36]
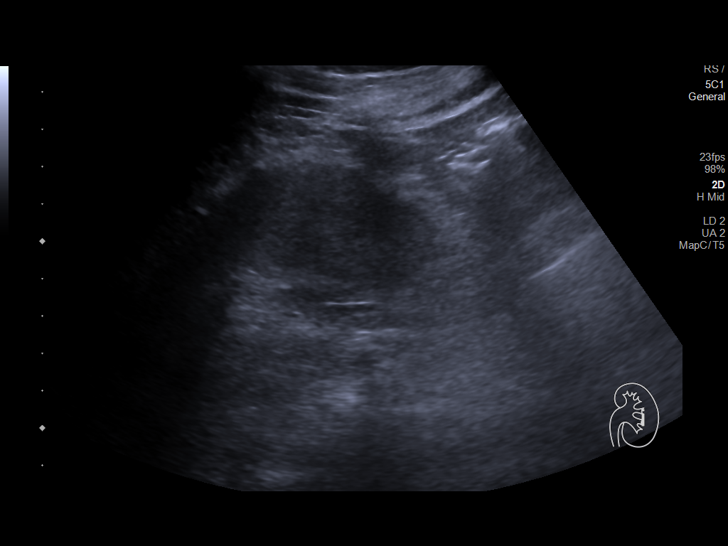
[im 22/36]
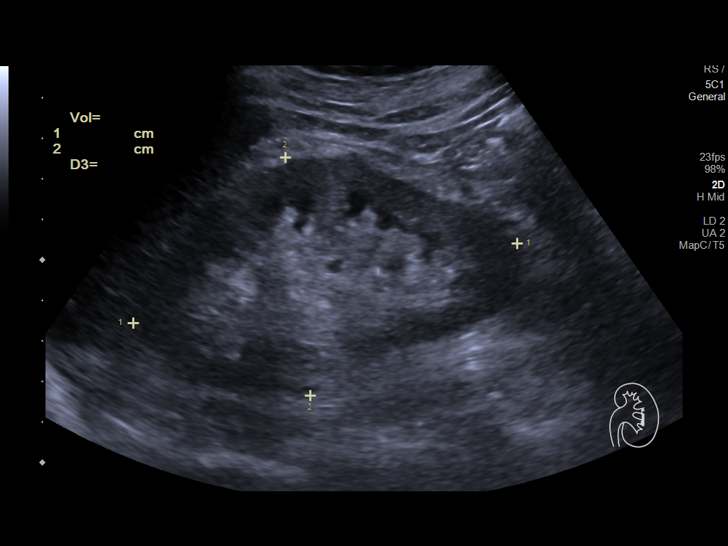
[im 24/36]
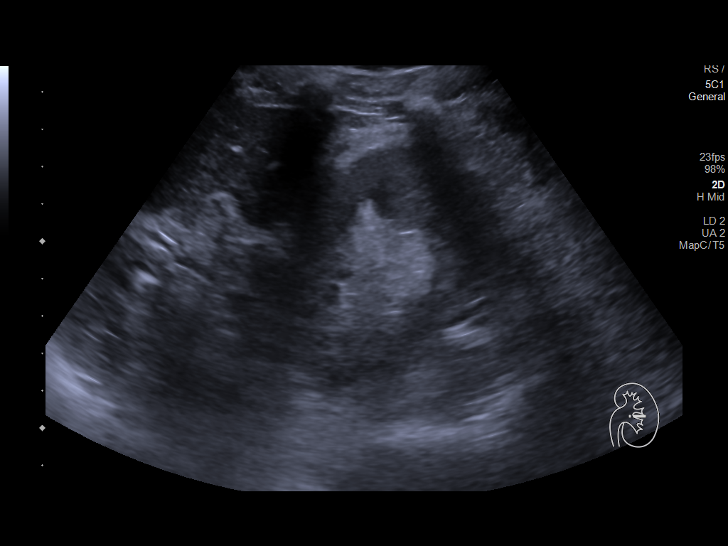
[im 27/36]
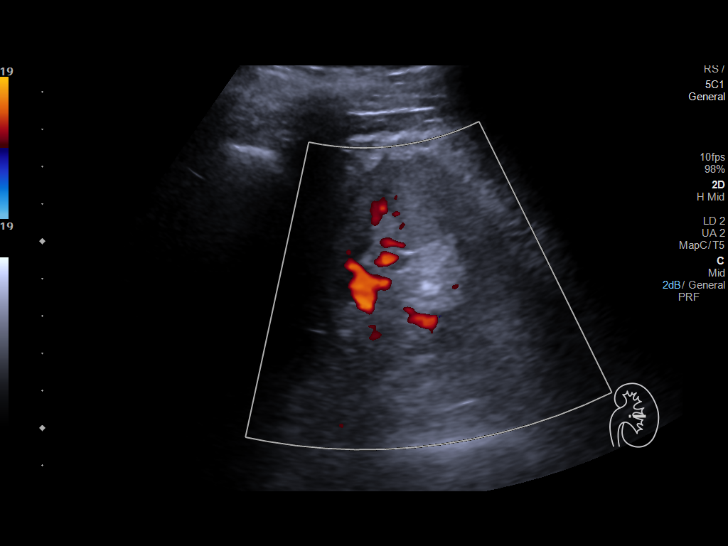
[im 30/36]
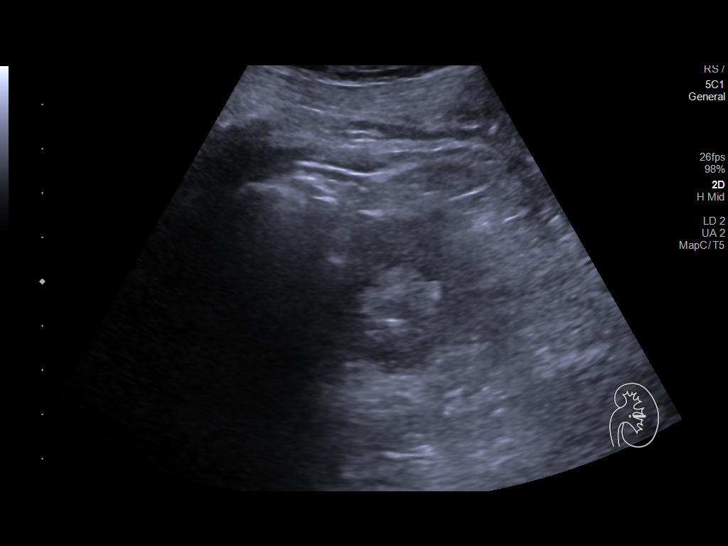
[im 33/36]
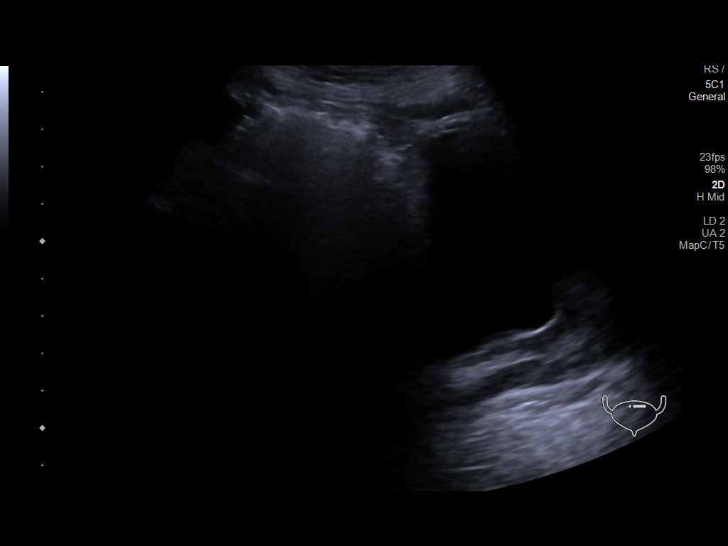
[im 36/36]
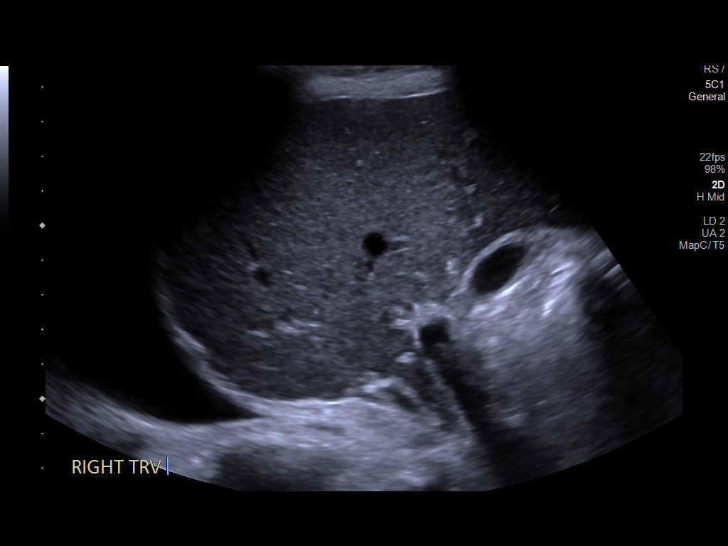

[14 of 25 positions shown; findings below may reference images not displayed]

FINDINGS: Right Kidney:

Renal measurements: AP 1 x 4.8 x 4.7 cm = volume: 96 mL. Diffusely
increased renal echogenicity. Mild thinning of the renal parenchyma.
No hydronephrosis. No evidence of focal lesion.

Left Kidney:

Renal measurements: 9.7 x 5.9 x 5.1 cm = volume: 152 mL. Slight
increased renal echogenicity. No hydronephrosis. No focal lesion.

Bladder:

Appears normal for degree of bladder distention.

Other:

Ascites and right pleural effusion, also seen on recent CT.
IMPRESSION: 1. Increased bilateral renal echogenicity consistent with chronic
medical renal disease. Mild right renal atrophy. No obstructive
uropathy.
2. Small volume ascites and right pleural effusion, as seen on
recent CT.

## 2019-10-03 IMAGING — CT CT ABD-PELV W/O CM
2 of 4 series · 15 of 46 positions shown, 17 images · non-contrast
Comparison: CT [DATE]

CLINICAL DATA: Nausea and vomiting. Small-bowel obstruction on CT
[DATE]. Improvement in clinical symptoms and symptoms return.

EXAM:
CT ABDOMEN AND PELVIS WITHOUT CONTRAST
TECHNIQUE: Multidetector CT imaging of the abdomen and pelvis was performed
following the standard protocol without IV contrast.

[Series 3: a/p w/o 5mm · axial · non-contrast · 0.67mm/px · z∈[-444,-29]mm · 12 of 91 slices shown, 14 images]
[im 4/91  soft-tissue]
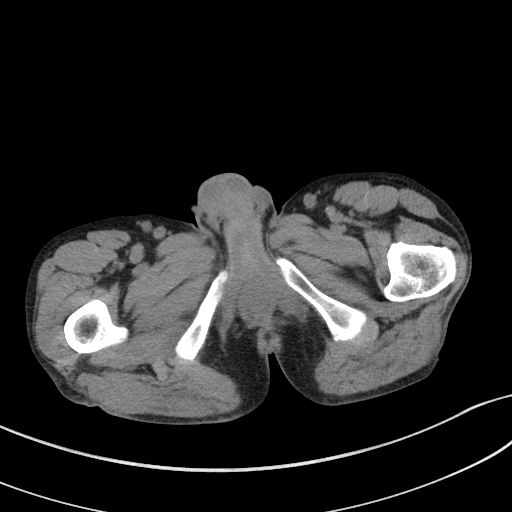
[im 4/91  bone]
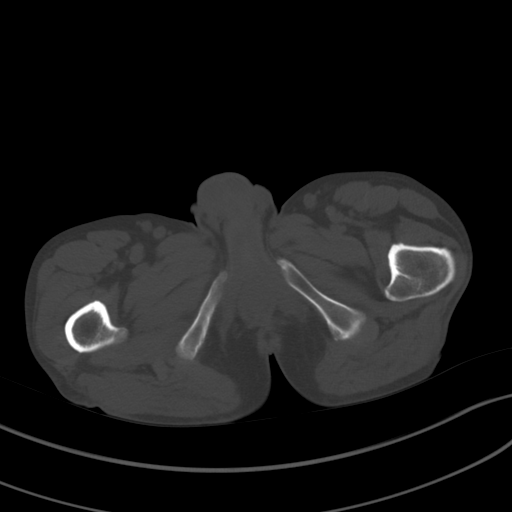
[im 12/91  soft-tissue]
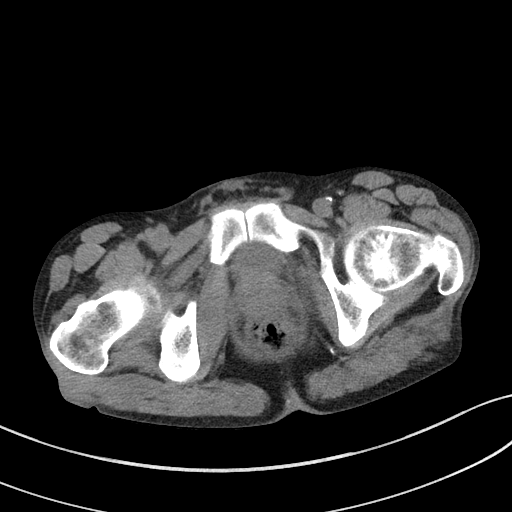
[im 20/91  soft-tissue]
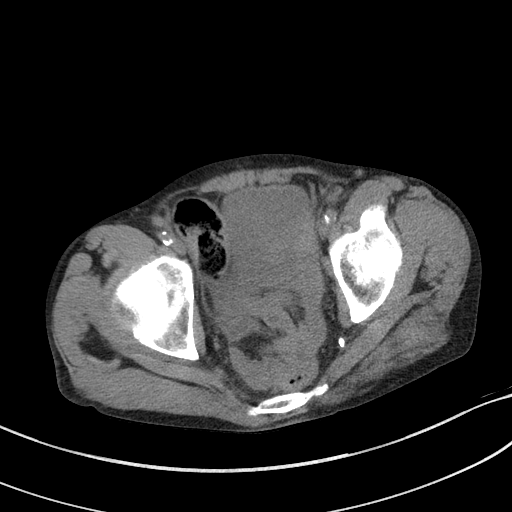
[im 28/91  soft-tissue]
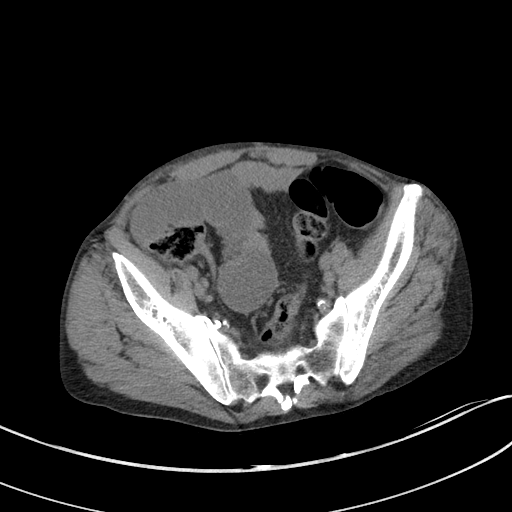
[im 36/91  soft-tissue]
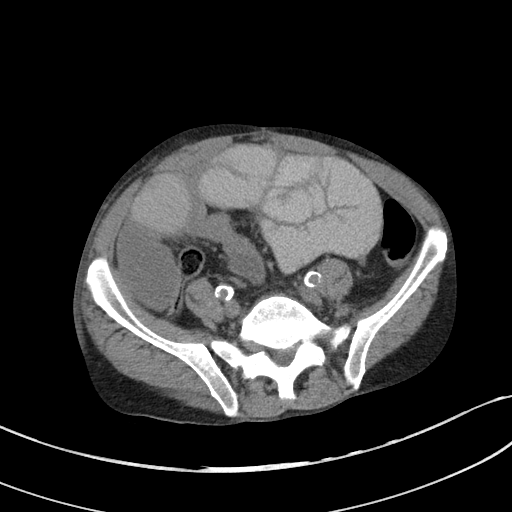
[im 44/91  soft-tissue]
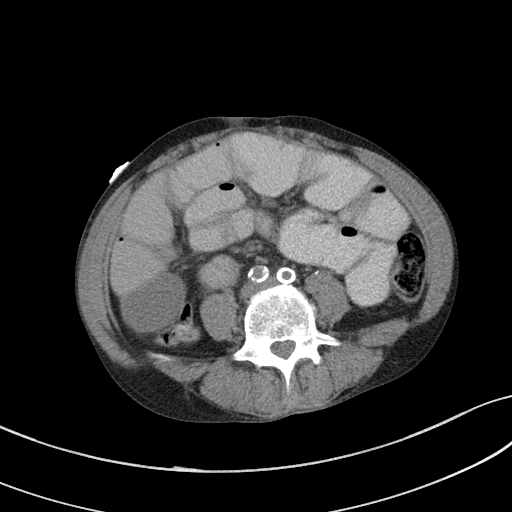
[im 47/91  soft-tissue]
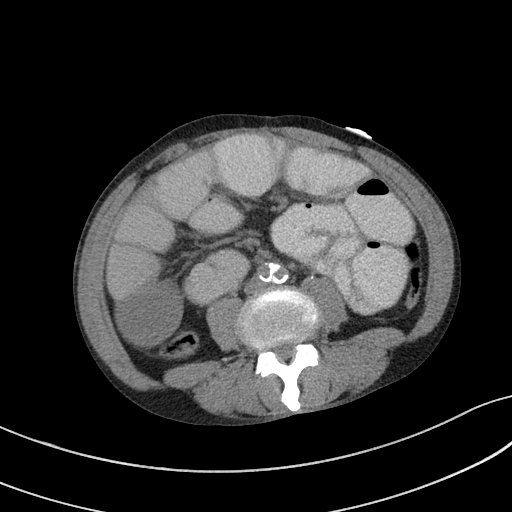
[im 55/91  soft-tissue]
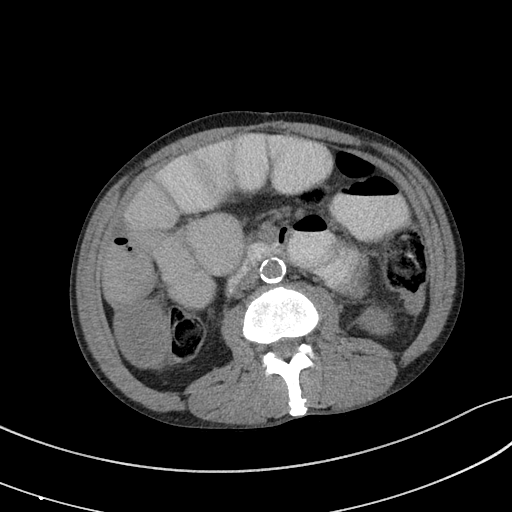
[im 63/91  soft-tissue]
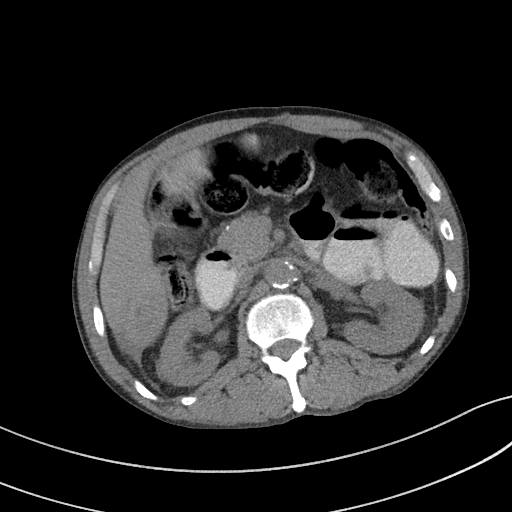
[im 63/91  bone]
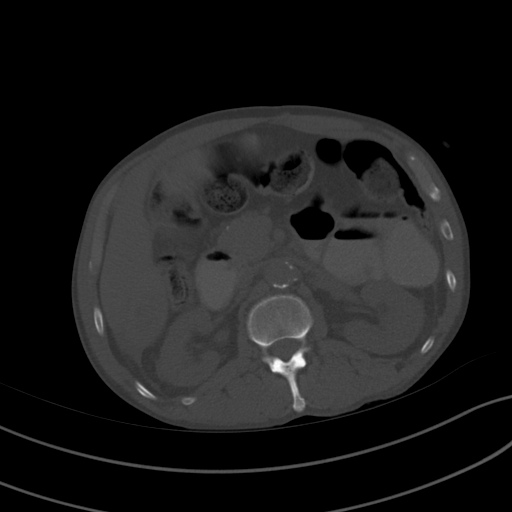
[im 71/91  soft-tissue]
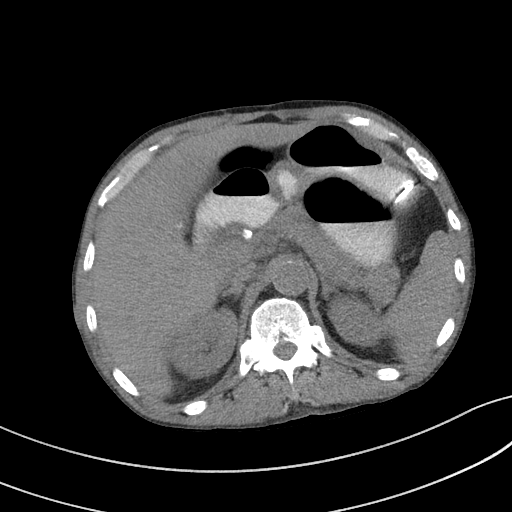
[im 79/91  soft-tissue]
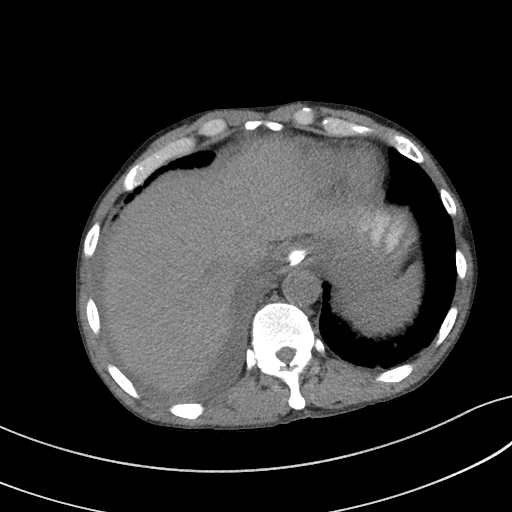
[im 87/91  soft-tissue]
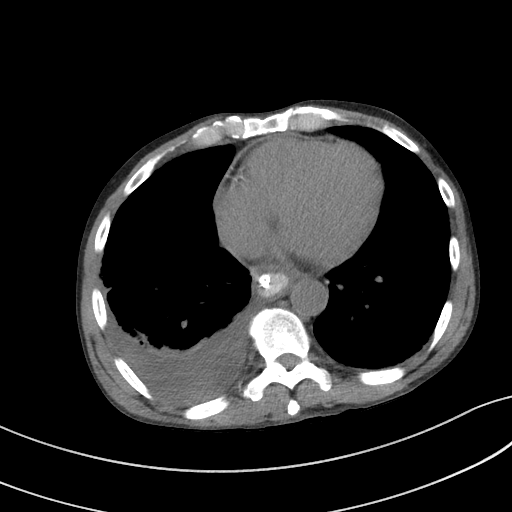

[Series 6: a/p w/o cor · coronal · non-contrast · 0.56mm/px · 3 of 121 slices shown]
[im 41/121  soft-tissue]
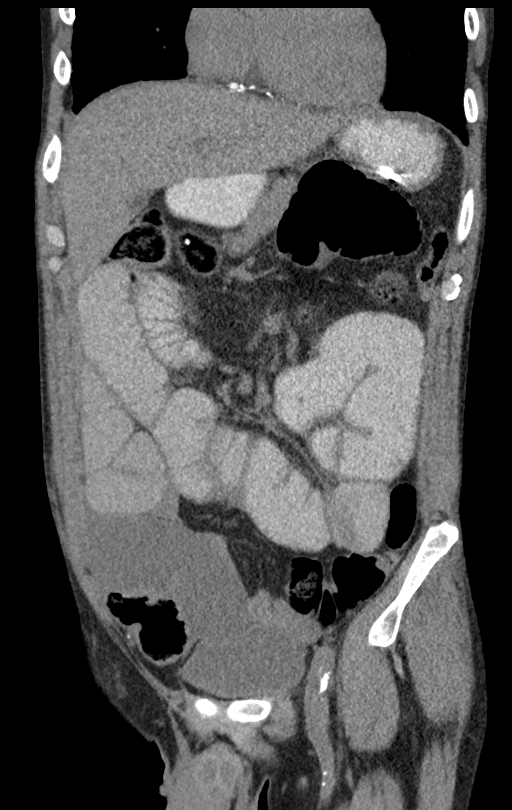
[im 54/121  soft-tissue]
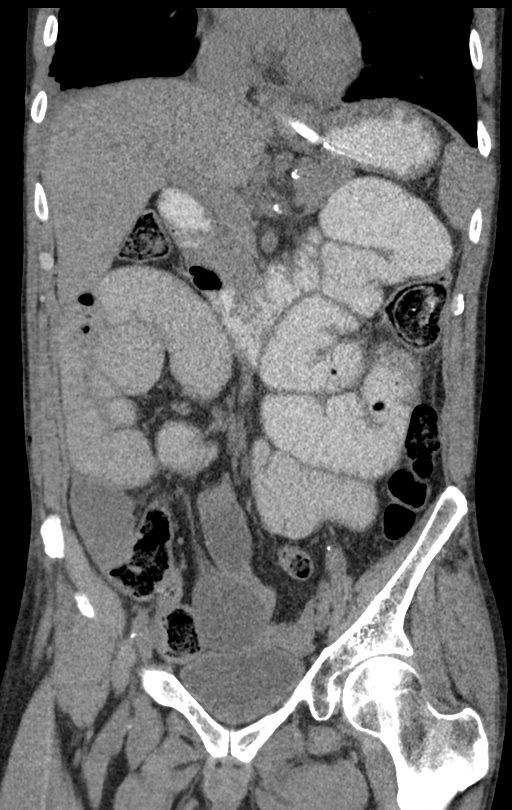
[im 67/121  soft-tissue]
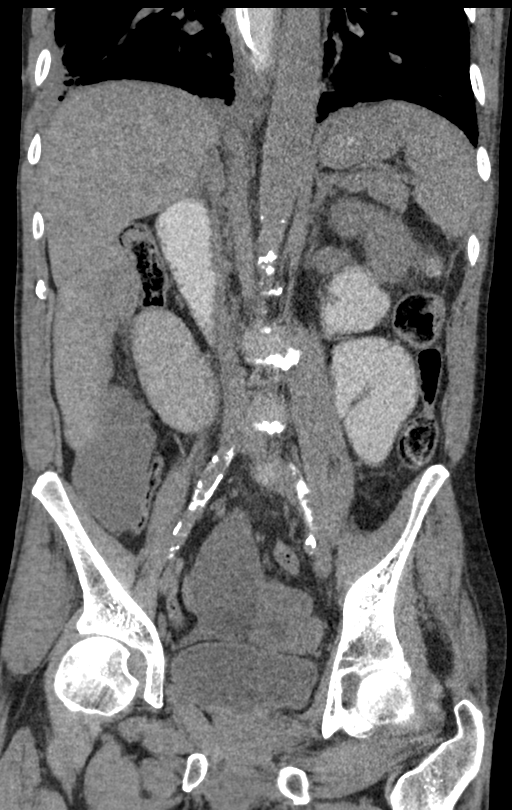

[15 of 46 positions shown; findings below may reference images not displayed]

FINDINGS: Lower chest: Moderate right pleural effusion is only partially
included. Adjacent compressive atelectasis. Subpleural reticulation
within both lung bases. There is enteric contrast in the distal
esophagus which is dilated. Coronary artery calcifications.

Hepatobiliary: No evidence of focal hepatic lesion on noncontrast
exam. Branching calcifications in the left liver likely vascular.
Calcified gallstones with[REDACTED]ompressed gallbladder.

Pancreas: No ductal dilatation or inflammation.

Spleen: Normal in size without focal abnormality.

Adrenals/Urinary Tract: No adrenal nodule. No hydronephrosis. No
significant perinephric edema. No renal calculi. Urinary bladder is
physiologically distended.

Stomach/Bowel: Enteric tube in the stomach. The distal esophagus is
dilated with enteric contrast. Mild gastric dilatation. Small bowel
are diffusely dilated and fluid/contrast filled. Transition point in
the right mid abdomen, series 3, image 56 with mild small bowel wall
thickening, also seen on prior exam. No pneumatosis. The more distal
small bowel are decompressed. Small to moderate volume of stool
throughout the colon. No colonic wall thickening. Normal appendix
tentatively visualized, series 3, image 66.

Vascular/Lymphatic: Advanced aortic and branch atherosclerosis. No
aortic aneurysm. No bulky abdominopelvic adenopathy.

Reproductive: Prostate is unremarkable.

Other: Small amount of perihepatic free fluid.  No free air.

Musculoskeletal: There are no acute or suspicious osseous
abnormalities.
IMPRESSION: 1. Persistent or recurrent small bowel obstruction with transition
point in the right mid abdomen. There is small bowel wall thickening
at the site of transition.
2. Small amount of perihepatic free fluid. No free air.
3. Moderate right pleural effusion is only partially included.
4. Cholelithiasis.

Aortic Atherosclerosis ([IU]-[IU]).

## 2019-10-03 MED ORDER — SODIUM CHLORIDE 0.9 % IV SOLN
510.0000 mg | Freq: Once | INTRAVENOUS | Status: AC
  Administered 2019-10-03: 510 mg via INTRAVENOUS
  Filled 2019-10-03: qty 17

## 2019-10-03 MED ORDER — DARBEPOETIN ALFA 40 MCG/0.4ML IJ SOSY
40.0000 ug | PREFILLED_SYRINGE | Freq: Once | INTRAMUSCULAR | Status: AC
  Administered 2019-10-03: 40 ug via SUBCUTANEOUS
  Filled 2019-10-03: qty 0.4

## 2019-10-03 MED ORDER — SODIUM CHLORIDE 0.9 % IV BOLUS
1000.0000 mL | Freq: Once | INTRAVENOUS | Status: AC
  Administered 2019-10-03: 1000 mL via INTRAVENOUS

## 2019-10-03 MED ORDER — DEXTROSE-NACL 5-0.9 % IV SOLN: INTRAVENOUS | Status: DC

## 2019-10-03 MED ORDER — POTASSIUM CHLORIDE 10 MEQ/100ML IV SOLN
10.0000 meq | INTRAVENOUS | Status: AC
  Administered 2019-10-03 (×2): 10 meq via INTRAVENOUS
  Filled 2019-10-03 (×2): qty 100

## 2019-10-03 NOTE — Progress Notes (Signed)
Subjective: CC: N/V  Patient last seen on our service on 6/9.  At that time he was tolerating full liquids and having bowel function.  Patient and family report that the night of 6/9 the patient began developing constant nausea along with multiple episodes of emesis.  They report that patient has had greater than 10 episodes of emesis in the last 24 hours.  He feels his abdomen is slightly more distended than baseline.  He has not passed any flatus since the night of 6/9 and last BM was 6/8 (may have had a small amount with enema yesterday but unsure). He denies any associated abdominal pain since the return of above symptoms. Notes dark urine and decreased urine output.    Objective: Vital signs in last 24 hours: Temp:  [98.2 F (36.8 C)-98.9 F (37.2 C)] 98.4 F (36.9 C) (06/11 0753) Pulse Rate:  [77-98] 98 (06/11 0753) Resp:  [14-17] 14 (06/11 0753) BP: (106-137)/(78-91) 106/84 (06/11 0753) SpO2:  [97 %-100 %] 97 % (06/11 0753) Weight:  [54.7 kg] 54.7 kg (06/11 0434) Last BM Date: 09/30/19  Intake/Output from previous day: 06/10 0701 - 06/11 0700 In: 360 [P.O.:360] Out: 550 [Urine:550] Intake/Output this shift: Total I/O In: 120 [P.O.:120] Out: -   PE: Gen: Alert, NAD, pleasant Pulm: Normal rate and effort Abd: Soft withmild to moderate distension,NT with light or deep palpation. No obvious hernias. HypoactiveBS, prior midline scar that is well healed Ext:No LE edema Skin: no rashes noted, warm and dry  Lab Results:  Recent Labs    10/01/19 0358 10/02/19 0403  WBC 5.0 5.4  HGB 9.5* 10.0*  HCT 31.5* 32.9*  PLT 176 206   BMET Recent Labs    10/02/19 0403 10/03/19 0451  NA 143 136  K 3.5 3.3*  CL 104 93*  CO2 22 27  GLUCOSE 99 103*  BUN 82* 94*  CREATININE 4.19* 7.32*  CALCIUM 8.8* 9.0   PT/INR No results for input(s): LABPROT, INR in the last 72 hours. CMP     Component Value Date/Time   NA 136 10/03/2019 0451   NA 145 (H) 06/11/2018  0852   K 3.3 (L) 10/03/2019 0451   CL 93 (L) 10/03/2019 0451   CO2 27 10/03/2019 0451   GLUCOSE 103 (H) 10/03/2019 0451   BUN 94 (H) 10/03/2019 0451   BUN 34 (H) 06/11/2018 0852   CREATININE 7.32 (H) 10/03/2019 0451   CREATININE 2.01 (H) 03/23/2017 1527   CALCIUM 9.0 10/03/2019 0451   PROT 7.0 09/30/2019 0358   ALBUMIN 4.1 10/03/2019 0451   AST 15 09/30/2019 0358   ALT 12 09/30/2019 0358   ALKPHOS 67 09/30/2019 0358   BILITOT 0.4 09/30/2019 0358   GFRNONAA 7 (L) 10/03/2019 0451   GFRNONAA 36 (L) 03/23/2017 1527   GFRAA 9 (L) 10/03/2019 0451   GFRAA 41 (L) 03/23/2017 1527   Lipase     Component Value Date/Time   LIPASE 40 09/28/2019 1310       Studies/Results: DG Abd Portable 1V  Result Date: 10/02/2019 CLINICAL DATA:  Follow-up small bowel obstruction EXAM: PORTABLE ABDOMEN - 1 VIEW COMPARISON:  09/29/2019 and prior exams FINDINGS: No bowel dilatation is identified. A small to moderate amount of stool in the colonic hepatic flexure noted. No acute abnormalities are identified. NG tube is not visualized. IMPRESSION: No dilated bowel loops identified. Electronically Signed   By: Margarette Canada M.D.   On: 10/02/2019 15:14    Anti-infectives: Anti-infectives (  From admission, onward)   None       Assessment/Plan HTN HLD CAD  Hypothyroidism ARF - Cr 7.32  Anion gap acidosis  - Per TRH -   SBO - Hx ex [facility] for a stab [facility] he [facility] 59[facility] -Patient previously improved but now with recurrent n/v and distension - Replace NGT and confirm placement with xray - Repeat CT with oral contrast (administered through NGT)  - Keep K>4 and Mg > 2 for bowel function - AM labs (CBC, K, Mg, Phosphorus) - Mobilize for bowel function  FEN -NPO, IVF, NGT. K 3.3, Mg 2.8, Phosphorus 7.0 VTE -SCDs, heparin subq ID -none. WBC 5.4 (6/10)   LOS: 5 days    Jillyn Ledger , [facility] 10/03/2019, 11:47 AM Please see  Amion for pager number during day hours 7:00am-4:30pm

## 2019-10-03 NOTE — Plan of Care (Signed)
  Problem: Education: Goal: Knowledge of General Education information will improve Description: Including pain rating scale, medication(s)/side effects and non-pharmacologic comfort measures Outcome: Progressing   Problem: Pain Managment: Goal: General experience of comfort will improve Outcome: Progressing   Problem: Skin Integrity: Goal: Risk for impaired skin integrity will decrease Outcome: Progressing
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
## 2019-10-03 NOTE — Progress Notes (Signed)
Nutrition Follow-up  DOCUMENTATION CODES:   Underweight, Severe malnutrition in context of chronic illness  INTERVENTION:   -D/c Ensure Enlive po BID, each supplement provides 350 kcal and 20 grams of protein -D/c Magic cup TID with meals, each supplement provides 290 kcal and 9 grams of protein -D/c MVI with minerals daily -If prolonged NPO status is anticipated, consider initiation of nutrition support  NUTRITION DIAGNOSIS:   Severe Malnutrition related to chronic illness (CHF, scleroderma) as evidenced by moderate fat depletion, severe fat depletion, moderate muscle depletion, severe muscle depletion.  Ongoing  GOAL:   Patient will meet greater than or equal to 90% of their needs  Unmet  MONITOR:   PO intake, Supplement acceptance, Diet advancement, Labs, Weight trends, Skin, I & O's  REASON FOR ASSESSMENT:   Consult Assessment of nutrition requirement/status  ASSESSMENT:   [name] is a 59 y.o. male with medical history significant of scleroderma, HTN, CAD, HLD, systolic CHF who presents to ED with complaints of 2 days hx of n/v, unable to tolerate PO. Patient recently had discussed with GI regarding coughing up blood and was planned for EGD and colonoscopy at a later date. Patient later called GI with complaints of frequent bilious emesis without coffee grounds. Patient was referred to ED for further work up.  6/6- NGT placed 6/9- NGT removed, advanced to full liquid diet 6/10- downgraded to clear liquid diet  Reviewed I/O's: -190 ml x 24 hours and -34 L since admission  UOP: 500 ml x 24 hours  Pt now NPO. Plan for NGT placement and CT with PO contrast. Pt continues to have nausea and vomiting.   Medications reviewed and include dextrose 5%-0.9% sodium chloride infusion and IV KCl.   Labs reviewed: K: 3.3 (on IV supplementation). Phos: 7.0, Mg: 2.8.  Diet Order:   Diet Order            Diet NPO time specified  Diet effective now                  EDUCATION NEEDS:   Education needs have been addressed  Skin:  Skin Assessment: Reviewed RN Assessment  Last BM:  10/02/19  Height:   Ht Readings from Last 1 Encounters:  09/30/19 _0  (1.727 m)    Weight:   Wt Readings from Last 1 Encounters:  10/03/19 54.7 kg    Ideal Body Weight:  63.6 kg  BMI:  Body mass index is 18.34 kg/m.  Estimated Nutritional Needs:   Kcal:  2000-2200  Protein:  105-110 grams  Fluid:  > 2 L    Loistine Chance, RD, LDN, [place] Registered Dietitian II Certified Diabetes Care and Education Specialist Please refer to [facility] for RD and/or RD on-call/weekend/after hours pager
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
## 2019-10-03 NOTE — Plan of Care (Signed)
  Problem: Education: Goal: Knowledge of General Education information will improve Description: Including pain rating scale, medication(s)/side effects and non-pharmacologic comfort measures Outcome: Progressing   Problem: Clinical Measurements: Goal: Respiratory complications will improve Outcome: Progressing Note: On room air   Problem: Coping: Goal: Level of anxiety will decrease Outcome: Progressing   Problem: Pain Managment: Goal: General experience of comfort will improve Outcome: Progressing Note: No complaints of pain, just nausea   Problem: Nutrition: Goal: Adequate nutrition will be maintained Outcome: Not Progressing Note: Unable to keep much down today, NG tube was replaced, filled up a whole canister immediately after being placed, let run on Low intermittent suction for an hour then contrast was given while being clamped for 2 hours in prep for CT. Renal US done, bladder scan done, 79m in bladder, pt has been unable to urinate for me today to obtain urine samples.

## 2019-10-03 NOTE — Progress Notes (Addendum)
VAST consulted to obtain 2nd IV access for fluid bolus. Pt currently with IVF's running through 24g IV at high rate. Assessed bilateral lower and upper arms utilizing ultrasound. Pt has extremely limited vasculature in both arms. Most vessels visualized were 24g vessels or smaller. VAST RN able to obtain deep IV access utilizing brachial vein in right arm with 22g 2.5 inch catheter. Educated pt's nurse, Lexi that this line should NOT be used for any fluid or drug that is caustic to veins (including phenergan or potassium) as vessel is deep and it will take longer to note an infiltrate. She verbalized understanding and stated she would share information with oncoming shift during shift report. If patient needs further access, it is recommended that he receive a central line d/t extremely limited vasculature. Due to patient's CKD he is not an appropriate candidate for midline or PICC placement unless first approved by nephrologist.

## 2019-10-03 NOTE — Progress Notes (Signed)
Continues to experience nausea and vomiting

## 2019-10-03 NOTE — Progress Notes (Signed)
PROGRESS NOTE  [name] YFV:[id number] DOB: 01-30-1961   PCP: [name], MD  Patient is from: Home.  DOA: 09/28/2019 LOS: 5  Brief Narrative / Interim history: 59 year old male with history of systolic CHF, scleroderma, HTN, CAD/PCI, HLD and malnutrition presenting with 2 days of nausea and vomiting and found to have small bowel obstruction.  He was managed conservatively with NG tube bowel decompression and IV fluid with improvement in symptoms.  NG tube discontinued on 10/01/2019.  He was cleared for discharge by general surgery from surgical standpoint. However, patient has significant emesis after NG tube removal.  General surgery recommended reinserting NGT.  Initially, patient refused NG tube replacement but he continued to have nausea and vomiting without bowel movement.  KUB without signs of SBO.  Attempted water enema without significant results continue to have intractable nausea and vomiting.  General surgery reconsulted on 6/11.  NG tube replaced.  CT abdomen and pelvis ordered.  Lumbar GI consulted as well for possible ileus/constipation.  Patient also developed worsening AKI with significant azotemia.  IV fluid continued.  Nephrology consulted.  Subjective: Seen and examined earlier this morning.  Continues to have intractable nausea and vomiting.  He had small stool after tapwater enema yesterday but not any more.  Denies abdominal pain.  Denies chest pain and dyspnea.  Patient's wife at bedside.  Frustrated about patient's poor progress.   Objective: Vitals:   10/02/19 1924 10/03/19 0348 10/03/19 0434 10/03/19 0753  BP: 137/78 (!) 106/91  106/84  Pulse: 77 91  98  Resp: _0 Temp: 98.9 F (37.2 C) 98.4 F (36.9 C)  98.4 F (36.9 C)  TempSrc: Oral Oral  Oral  SpO2: 100% 100%  97%  Weight:   54.7 kg   Height:        Intake/Output Summary (Last 24 hours) at 10/03/2019 1335 Last data filed at 10/03/2019 0900 Gross per 24 hour  Intake 240 ml  Output  150 ml  Net 90 ml   Filed Weights   09/30/19 0437 10/02/19 0500 10/03/19 0434  Weight: 54.9 kg 55.3 kg 54.7 kg    Examination:  GENERAL: Frail looking male.  No apparent distress.  Nontoxic. HEENT: MMM.  Vision and hearing grossly intact.  NECK: Supple.  No apparent JVD.  RESP: On room air.  No IWOB.  Fair aeration bilaterally. CVS:  RRR. Heart sounds normal.  ABD/GI/GU: BS diminished. Abd soft, NTND.  MSK/EXT:  Moves extremities. No apparent deformity. No edema.  SKIN: no apparent skin lesion or wound NEURO: Awake, alert and oriented appropriately.  No apparent focal neuro deficit. PSYCH: Calm. Normal affect.   Procedures:  6/6-6/9 NG tube for SBO  Microbiology summarized: COVID-19 PCR negative.  Assessment & Plan: Small bowel obstruction with marked large bowel constipation.   Intractable nausea and vomiting -Reconsulted general surgery-NG tube replaced.  CT abdomen ordered. -Consulted gastroenterology-appreciate input. 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  BUN 51 >83> 94. -Change fluids to D5-NS at 75 cc an hour that his hyponatremia has resolved.  -Avoid nephrotoxic meds -Check renal ultrasound, urine sodium and creatinine -Nephrology, Dr. Carolin Sicks consulted.  Chronic diastolic CHF: Echo on 1/63/8466 with EF of 55%, G2 DD and RVSP of 33.5.  He is probably hypovolemic from GI loss.  No cardiopulmonary symptoms.  Followed by Dr. Haroldine Laws. -Monitor fluid status while on IV fluid -Hold home diuretics  Pulmonary hypertension: Echo as  above. -We will resume home Opsumit and Revatio once he started tolerating p.o.  Hypernatremia: Na 149> 143>136.  Resolved. -Change IV fluid to D5-NS.  Hypokalemia: Likely due to GI loss. -Replenish carefully with IV KCl 00 mEq x 1.  Scleroderma-stable.  Followed by Dr. Estanislado Pandy -Continue home Norvasc, sildenafil  History of CAD s/p PCA-no anginal symptoms.   Followed by cardiology, Dr. [name] -Continue home medications  Hypomagnesemia: Resolved.  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  H&H relatively stable. -Per nephrology -Continue monitoring.  Debility/physical deconditioning due to acute illness and hospitalization -PT/OT eval  Severe malnutrition:Body mass index is 18.34 kg/m.  Significant muscle mass and subcu fat loss. -Appreciate dietitian input Nutrition Problem: Severe Malnutrition Etiology: chronic illness (CHF, scleroderma) Signs/Symptoms: moderate fat depletion, severe fat depletion, moderate muscle depletion, severe muscle depletion Interventions: Ensure Enlive (each supplement provides 350kcal and 20 grams of protein), Magic cup, MVI   DVT prophylaxis: Subcu heparin Code Status: Full code Family Communication: Updated patient's wife at the bedside. Status is: Inpatient  Remains inpatient appropriate because:IV treatments appropriate due to intensity of illness or inability to take PO, Inpatient level of care appropriate due to severity of illness and Ongoing emesis   Dispo: The patient is from: Home              Anticipated d/c is to: Home              Anticipated d/c date is: 3 days              Patient currently is not medically stable to d/c.       Consultants:  General surgery Gastroenterology Nephrology  Sch Meds:  Scheduled Meds: . feeding supplement (ENSURE ENLIVE)  237 mL Oral TID BM  . heparin  5,000 Units Subcutaneous Q8H  . multivitamin with minerals  1 tablet Oral Daily  . sodium chloride flush  3 mL Intravenous Once   Continuous Infusions: . dextrose 5 % and 0.2 % NaCl 100 mL/hr at 10/03/19 1315  . potassium chloride 10 mEq (10/03/19 1320)   PRN Meds:.acetaminophen, bisacodyl, labetalol, ondansetron **OR** ondansetron (ZOFRAN) IV, prochlorperazine  Antimicrobials: Anti-infectives (From admission, onward)   None       I have personally  reviewed the following labs and images: CBC: Recent Labs  Lab 09/28/19 1310 09/29/19 0425 09/30/19 0358 10/01/19 0358 10/02/19 0403  WBC 5.6 4.7 4.8 5.0 5.4  HGB 9.9* 9.2* 9.8* 9.5* 10.0*  HCT 32.7* 30.5* 32.6* 31.5* 32.9*  MCV 101.9* 100.0 100.9* 102.6* 99.4  PLT 193 166 190 176 206   BMP &GFR Recent Labs  Lab 09/29/19 0425 09/29/19 1159 09/30/19 0358 10/01/19 0358 10/01/19 1301 10/02/19 0403 10/03/19 0451  NA   < >  --  147* 149* 144 143 136  K   < >  --  4.3 4.2 4.0 3.5 3.3*  CL   < >  --  108 115* 111 104 93*  CO2   < >  --  21* 20* _0 GLUCOSE   < >  --  100* 83 107* 99 103*  BUN   < >  --  73* 83* 78* 82* 94*  CREATININE   < >  --  4.53* 4.52* 4.01* 4.19* 7.32*  CALCIUM   < >  --  7.9* 8.3* 8.3* 8.8* 9.0  MG  --  1.0*  --  3.0*  --  2.7* 2.8*  PHOS  --   --   --   --   --  4.8* 7.0*   < > = values in this interval not displayed.   Estimated Creatinine Clearance: 8.3 mL/min (A) (by C-G formula based on SCr of 7.32 mg/dL (H)). Liver & Pancreas: Recent Labs  Lab 09/28/19 1310 09/29/19 0425 09/30/19 0358 10/02/19 0403 10/03/19 0451  AST _0 --   --   ALT _1 --   --   ALKPHOS 77 66 67  --   --   BILITOT 0.6 0.4 0.4  --   --   PROT 8.0 7.0 7.0  --   --   ALBUMIN 4.2 3.7 3.9 3.7 4.1   Recent Labs  Lab 09/28/19 1310  LIPASE 40   No results for input(s): AMMONIA in the last 168 hours. Diabetic: No results for input(s): HGBA1C in the last 72 hours. No results for input(s): GLUCAP in the last 168 hours. Cardiac Enzymes: No results for input(s): CKTOTAL, CKMB, CKMBINDEX, TROPONINI in the last 168 hours. No results for input(s): PROBNP in the last 8760 hours. Coagulation Profile: No results for input(s): INR, PROTIME in the last 168 hours. Thyroid Function Tests: No results for input(s): TSH, T4TOTAL, FREET4, T3FREE, THYROIDAB in the last 72 hours. Lipid Profile: No results for input(s): CHOL, HDL, LDLCALC, TRIG, CHOLHDL, LDLDIRECT in  the last 72 hours. Anemia Panel: Recent Labs    10/02/19 1209  VITAMINB12 277  FOLATE 16.1  FERRITIN 603*  TIBC 266  IRON 26*  RETICCTPCT 1.5   Urine analysis:    Component Value Date/Time   COLORURINE YELLOW 02/07/2019 0847   APPEARANCEUR CLEAR 02/07/2019 0847   LABSPEC 1.008 02/07/2019 0847   PHURINE 5.0 02/07/2019 0847   GLUCOSEU NEGATIVE 02/07/2019 0847   HGBUR NEGATIVE 02/07/2019 0847   BILIRUBINUR NEGATIVE 02/07/2019 [place] 02/07/2019 0847   PROTEINUR 30 (A) 02/07/2019 0847   NITRITE NEGATIVE 02/07/2019 0847   LEUKOCYTESUR NEGATIVE 02/07/2019 0847   Sepsis Labs: Invalid input(s): PROCALCITONIN, [place]  Microbiology: Recent Results (from the past 240 hour(s))  SARS Coronavirus 2 by RT PCR (hospital order, performed in [facility] hospital lab) Nasopharyngeal Nasopharyngeal Swab     Status: None   Collection Time: 09/28/19  4:59 PM   Specimen: Nasopharyngeal Swab  Result Value Ref Range Status   SARS Coronavirus 2 NEGATIVE NEGATIVE Final    Comment: (NOTE) SARS-CoV-2 target nucleic acids are NOT DETECTED. The SARS-CoV-2 RNA is generally detectable in upper and lower respiratory specimens during the acute phase of infection. The lowest concentration of SARS-CoV-2 viral copies this assay can detect is 250 copies / mL. A negative result does not preclude SARS-CoV-2 infection and should not be used as the sole basis for treatment or other patient management decisions.  A negative result may occur with improper specimen collection / handling, submission of specimen other than nasopharyngeal swab, presence of viral mutation(s) within the areas targeted by this assay, and inadequate number of viral copies (<250 copies / mL). A negative result must be combined with clinical observations, patient history, and epidemiological information. Fact Sheet for Patients:   StrictlyIdeas.no Fact Sheet for Healthcare  Providers: BankingDealers.co.za This test is not yet approved or cleared  by the [place] FDA and has been authorized for detection and/or diagnosis of SARS-CoV-2 by FDA under an Emergency Use Authorization (EUA).  This EUA will remain in effect (meaning this test can be used) for the duration of the COVID-19 declaration under Section 564(b)(1) of the Act, 21 U.S.C. section 360bbb-3(b)(1), unless  the authorization is terminated or revoked sooner. Performed at [facility], [place] [address] [place], [place] [address]     Radiology Studies: No results found.    Adar Rase T. [place]  If 7PM-7AM, please contact night-coverage [email or web address] Password [facility] 10/03/2019, 1:35 PM

## 2019-10-04 ENCOUNTER — Inpatient Hospital Stay (HOSPITAL_COMMUNITY): Payer: 59

## 2019-10-04 DIAGNOSIS — R933 Abnormal findings on diagnostic imaging of other parts of digestive tract: Secondary | ICD-10-CM

## 2019-10-04 DIAGNOSIS — Z01810 Encounter for preprocedural cardiovascular examination: Secondary | ICD-10-CM

## 2019-10-04 LAB — CBC
HCT: 34.4 % — ABNORMAL LOW (ref 39.0–52.0)
Hemoglobin: 10.8 g/dL — ABNORMAL LOW (ref 13.0–17.0)
MCH: 30.5 pg (ref 26.0–34.0)
MCHC: 31.4 g/dL (ref 30.0–36.0)
MCV: 97.2 fL (ref 80.0–100.0)
Platelets: 179 10*3/uL (ref 150–400)
RBC: 3.54 MIL/uL — ABNORMAL LOW (ref 4.22–5.81)
RDW: 15.3 % (ref 11.5–15.5)
WBC: 9.5 10*3/uL (ref 4.0–10.5)
nRBC: 0 % (ref 0.0–0.2)

## 2019-10-04 LAB — RENAL FUNCTION PANEL
Albumin: 3.4 g/dL — ABNORMAL LOW (ref 3.5–5.0)
Anion gap: 18 — ABNORMAL HIGH (ref 5–15)
BUN: 106 mg/dL — ABNORMAL HIGH (ref 6–20)
CO2: 23 mmol/L (ref 22–32)
Calcium: 8.4 mg/dL — ABNORMAL LOW (ref 8.9–10.3)
Chloride: 94 mmol/L — ABNORMAL LOW (ref 98–111)
Creatinine, Ser: 7.58 mg/dL — ABNORMAL HIGH (ref 0.61–1.24)
GFR calc Af Amer: 8 mL/min — ABNORMAL LOW (ref >60)
GFR calc non Af Amer: 7 mL/min — ABNORMAL LOW (ref >60)
Glucose, Bld: 96 mg/dL (ref 70–99)
Phosphorus: 7.3 mg/dL — ABNORMAL HIGH (ref 2.5–4.6)
Potassium: 3.9 mmol/L (ref 3.5–5.1)
Sodium: 135 mmol/L (ref 135–145)

## 2019-10-04 LAB — SODIUM, URINE, RANDOM: Sodium, Ur: 54 mmol/L

## 2019-10-04 LAB — CREATININE, URINE, RANDOM: Creatinine, Urine: 201.43 mg/dL

## 2019-10-04 LAB — MAGNESIUM: Magnesium: 2.3 mg/dL (ref 1.7–2.4)

## 2019-10-04 IMAGING — DX DG ABD PORTABLE 1V
1 series · 1 of 1 positions shown · non-contrast
Comparison: [DATE]

CLINICAL DATA: Nausea and vomiting. Evaluate for small bowel
obstruction.

EXAM:
PORTABLE ABDOMEN - 1 VIEW

[abdomen kub]
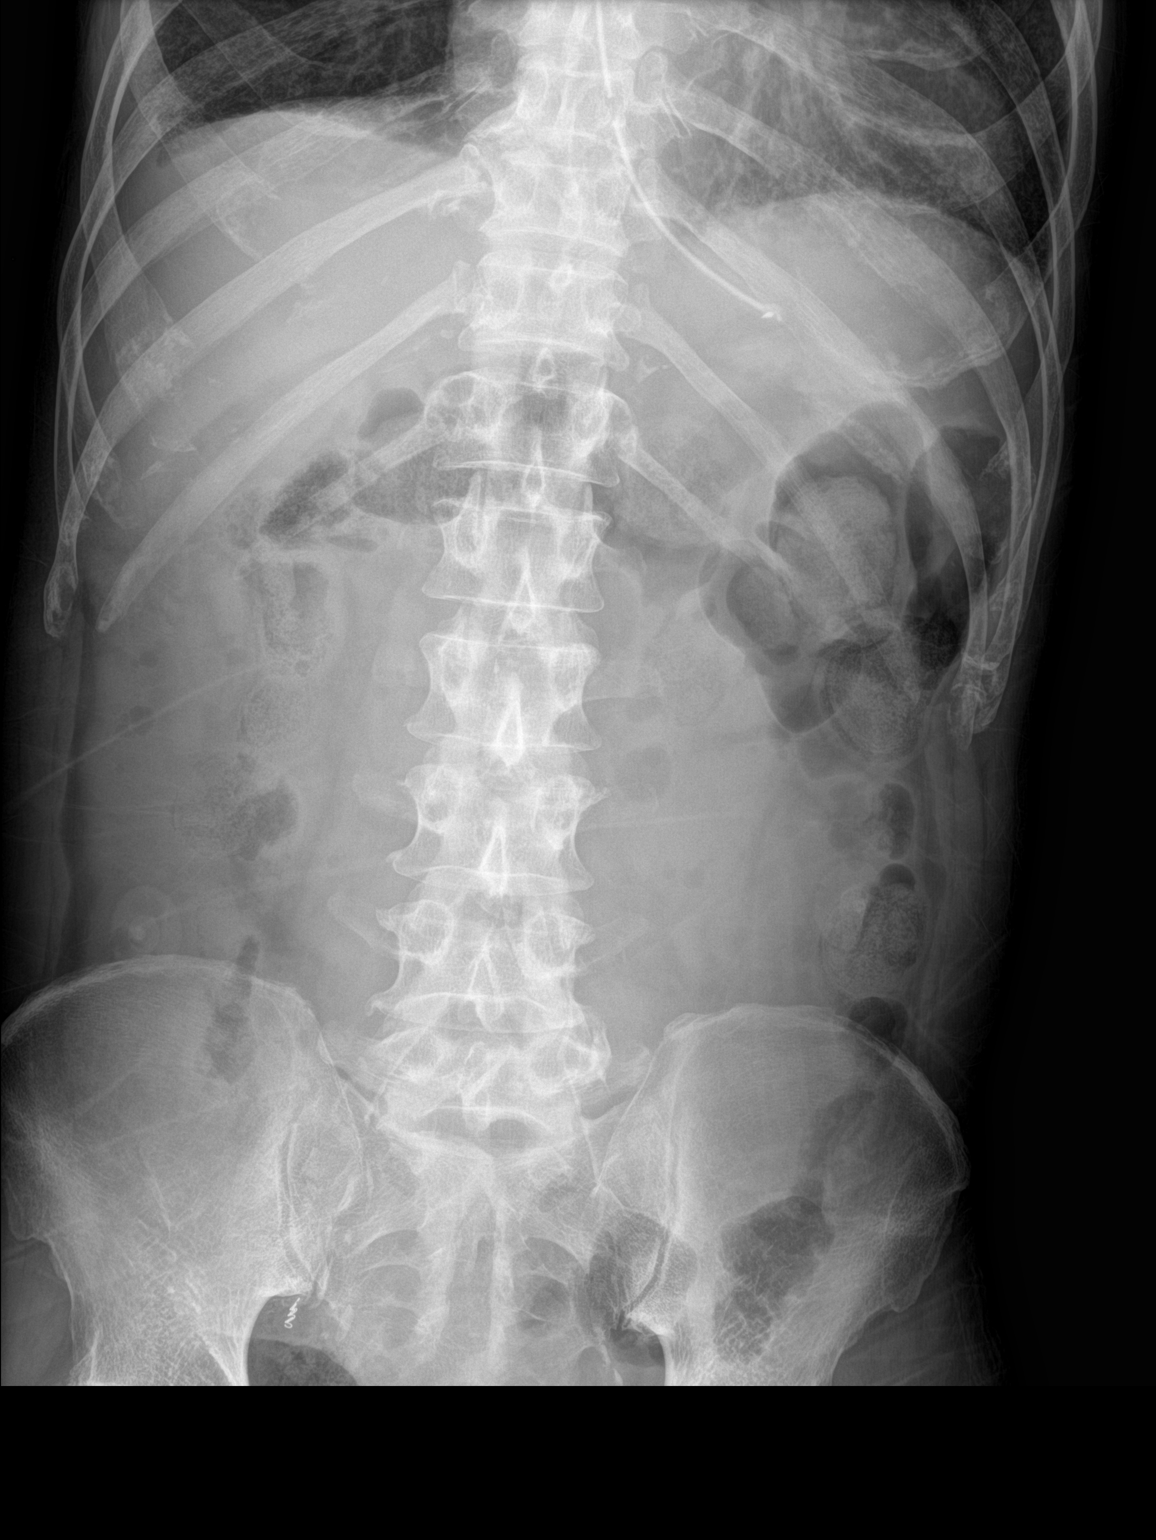

[1 of 1 positions shown; findings below may reference images not displayed]

FINDINGS: Nasogastric tube has been pulled back and has tip over the stomach
in the left upper quadrant and side-port over the distal esophagus
above the gastroesophageal junction. Bowel gas pattern is
nonobstructive. No free peritoneal air. Cardiomediastinal silhouette
and remainder of the exam is unchanged.
IMPRESSION: 1.  Nonobstructive bowel gas pattern.

2. Nasogastric tube has been pulled back slightly as tip is over the
stomach in the left upper quadrant and side-port over the distal
esophagus. This could be advanced 6-7 cm.

## 2019-10-04 MED ORDER — ACETAMINOPHEN 650 MG RE SUPP
650.0000 mg | Freq: Four times a day (QID) | RECTAL | Status: DC | PRN
  Administered 2019-10-04 (×2): 650 mg via RECTAL
  Filled 2019-10-04 (×2): qty 1

## 2019-10-04 MED ORDER — BISACODYL 5 MG PO TBEC
5.0000 mg | DELAYED_RELEASE_TABLET | Freq: Once | ORAL | Status: AC
  Administered 2019-10-04: 5 mg via ORAL
  Filled 2019-10-04: qty 1

## 2019-10-04 MED ORDER — PHENOL 1.4 % MT LIQD
1.0000 | OROMUCOSAL | Status: DC | PRN
  Administered 2019-10-04: 1 via OROMUCOSAL
  Filled 2019-10-04: qty 177

## 2019-10-04 MED ORDER — SODIUM CHLORIDE 0.9 % IV BOLUS
1500.0000 mL | Freq: Once | INTRAVENOUS | Status: AC
  Administered 2019-10-04: 1500 mL via INTRAVENOUS

## 2019-10-04 NOTE — Progress Notes (Addendum)
Subjective/Chief Complaint: Pt cont' with no BM NGT in place   Objective: Vital signs in last 24 hours: Temp:  [98 F (36.7 C)-101.4 F (38.6 C)] 98.4 F (36.9 C) (06/12 0800) Pulse Rate:  [95-110] 104 (06/12 0800) Resp:  [14-16] 16 (06/12 0530) BP: (96-122)/(68-82) 111/76 (06/12 0800) SpO2:  [98 %-100 %] 100 % (06/12 0800) Last BM Date: 10/02/19  Intake/Output from previous day: 06/11 0701 - 06/12 0700 In: 4641.8 [P.O.:300; I.V.:3094.3; IV Piggyback:1247.4] Out: 2100 [Urine:250; Emesis/NG [id number]] Intake/Output this shift: No intake/output data recorded.  PE: Constitutional: No acute distress, conversant, appears states age. Eyes: Anicteric sclerae, moist conjunctiva, no lid lag Lungs: Clear to auscultation bilaterally, normal respiratory effort CV: regular rate and rhythm, no murmurs, no peripheral edema, pedal pulses 2+ GI: Soft, no masses or hepatosplenomegaly, non-tender to palpation, ND Skin: No rashes, palpation reveals normal turgor Psychiatric: appropriate judgment and insight, oriented to person, place, and time   Lab Results:  Recent Labs    10/02/19 0403 10/04/19 0633  WBC 5.4 9.5  HGB 10.0* 10.8*  HCT 32.9* 34.4*  PLT 206 179   BMET Recent Labs    10/03/19 0451 10/04/19 0633  NA 136 135  K 3.3* 3.9  CL 93* 94*  CO2 27 23  GLUCOSE 103* 96  BUN 94* 106*  CREATININE 7.32* 7.58*  CALCIUM 9.0 8.4*  Studies/Results: CT ABDOMEN PELVIS WO CONTRAST  Result Date: 10/03/2019 CLINICAL DATA:  Nausea and vomiting. Small-bowel obstruction on CT 09/28/2019. Improvement in clinical symptoms and symptoms return. EXAM: CT ABDOMEN AND PELVIS WITHOUT CONTRAST TECHNIQUE: Multidetector CT imaging of the abdomen and pelvis was performed following the standard protocol without IV contrast. COMPARISON:  CT 09/28/2019 FINDINGS: Lower chest: Moderate right pleural effusion is only partially included. Adjacent compressive atelectasis. Subpleural reticulation  within both lung bases. There is enteric contrast in the distal esophagus which is dilated. Coronary artery calcifications. Hepatobiliary: No evidence of focal hepatic lesion on noncontrast exam. Branching calcifications in the left liver likely vascular. Calcified gallstones within decompressed gallbladder. Pancreas: No ductal dilatation or inflammation. Spleen: Normal in size without focal abnormality. Adrenals/Urinary Tract: No adrenal nodule. No hydronephrosis. No significant perinephric edema. No renal calculi. Urinary bladder is physiologically distended. Stomach/Bowel: Enteric tube in the stomach. The distal esophagus is dilated with enteric contrast. Mild gastric dilatation. Small bowel are diffusely dilated and fluid/contrast filled. Transition point in the right mid abdomen, series 3, image 56 with mild small bowel wall thickening, also seen on prior exam. No pneumatosis. The more distal small bowel are decompressed. Small to moderate volume of stool throughout the colon. No colonic wall thickening. Normal appendix tentatively visualized, series 3, image 66. Vascular/Lymphatic: Advanced aortic and branch atherosclerosis. No aortic aneurysm. No bulky abdominopelvic adenopathy. Reproductive: Prostate is unremarkable. Other: Small amount of perihepatic free fluid.  No free air. Musculoskeletal: There are no acute or suspicious osseous abnormalities. IMPRESSION: 1. Persistent or recurrent small bowel obstruction with transition point in the right mid abdomen. There is small bowel wall thickening at the site of transition. 2. Small amount of perihepatic free fluid. No free air. 3. Moderate right pleural effusion is only partially included. 4. Cholelithiasis. Aortic Atherosclerosis (ICD10-I70.0). Electronically Signed   By: Keith Rake M.D.   On: 10/03/2019 20:46   US RENAL  Result Date: 10/03/2019 CLINICAL DATA:  Acute kidney injury. EXAM: RENAL / URINARY TRACT ULTRASOUND COMPLETE COMPARISON:   Abdominal CT 09/28/2018 FINDINGS: Right Kidney: Renal measurements: AP 1 x 4.8 x 4.7  cm = volume: 96 mL. Diffusely increased renal echogenicity. Mild thinning of the renal parenchyma. No hydronephrosis. No evidence of focal lesion. Left Kidney: Renal measurements: 9.7 x 5.9 x 5.1 cm = volume: 152 mL. Slight increased renal echogenicity. No hydronephrosis. No focal lesion. Bladder: Appears normal for degree of bladder distention. Other: Ascites and right pleural effusion, also seen on recent CT. IMPRESSION: 1. Increased bilateral renal echogenicity consistent with chronic medical renal disease. Mild right renal atrophy. No obstructive uropathy. 2. Small volume ascites and right pleural effusion, as seen on recent CT. Electronically Signed   By: Keith Rake M.D.   On: 10/03/2019 17:03   DG Abd Portable 1V  Result Date: 10/03/2019 CLINICAL DATA:  59 year old male enteric tube placement. EXAM: PORTABLE ABDOMEN - 1 VIEW COMPARISON:  10/02/2019 KUB and earlier. FINDINGS: Portable AP supine view at 1446 hours. Enteric tube terminates in the left upper quadrant, side hole at the level of the gastric cardia. Bowel-gas pattern has not significantly changed from the CT on 09/28/2019, and might indicate continued fluid-filled small bowel loops throughout the abdomen. Right pleural effusion is less apparent. Lung bases appear stable. No acute osseous abnormality identified. IMPRESSION: 1. Enteric tube placed into the stomach, side hole at the level of the gastric cardia. 2. Stable bowel gas pattern since 09/28/2019, suspicious for ongoing fluid-filled small bowel. Electronically Signed   By: Genevie Ann M.D.   On: 10/03/2019 15:14   DG Abd Portable 1V  Result Date: 10/02/2019 CLINICAL DATA:  Follow-up small bowel obstruction EXAM: PORTABLE ABDOMEN - 1 VIEW COMPARISON:  09/29/2019 and prior exams FINDINGS: No bowel dilatation is identified. A small to moderate amount of stool in the colonic hepatic flexure noted. No  acute abnormalities are identified. NG tube is not visualized. IMPRESSION: No dilated bowel loops identified. Electronically Signed   By: Margarette Canada M.D.   On: 10/02/2019 15:14    Assessment/Plan: HTN HLD CAD  Hypothyroidism ARF - Cr 7.32  Anion gap acidosis  - Per [facility] -  SBO - Hx ex [facility] for a stab [facility] he was 59[facility] -Patient previously improved but now with recurrent n/v and distension - Replace NGT and confirm placement with xray - Repeat CT with oral contrast (administered through NGT)  - Keep K>4 and Mg > 2 for bowel function - AM labs (CBC, K, Mg, Phosphorus) - Mobilize for bowel function -Will repeat AXR Sun.  If not any better may need OR Monday -Would benefit from Cards eval for possible surgery   FEN -NPO, IVF, NGT. K 3.3, Mg 2.8, Phosphorus 7.0 VTE -SCDs, heparin subq ID -none. WBC 9.5 (6/10)  LOS: 6 days    Ralene Ok 10/04/2019

## 2019-10-04 NOTE — Progress Notes (Signed)
PROGRESS NOTE  [name] HGD:[id number] DOB: 09-30-1960   PCP: [name], MD  Patient is from: Home.  DOA: 09/28/2019 LOS: 6  Brief Narrative / Interim history: 59 year old male with history of systolic CHF, scleroderma, HTN, CAD/PCI, HLD and malnutrition presenting with 2 days of nausea and vomiting and found to have small bowel obstruction.  He was managed conservatively with NG tube bowel decompression and IV fluid with improvement in symptoms.  NG tube discontinued on 10/01/2019.  He was cleared for discharge by general surgery from surgical standpoint. However, patient has significant emesis after NG tube removal.  General surgery recommended reinserting NGT.  Initially, patient refused NG tube replacement but he continued to have nausea and vomiting without bowel movement.  KUB without signs of SBO.  Attempted water enema without significant results continue to have intractable nausea and vomiting.  General surgery reconsulted on 6/11.  NG tube replaced.  CT abdomen and pelvis ordered.  Lumbar GI consulted as well for possible ileus/constipation.  Repeat CT abdomen and pelvis with persistent or recurrent SBO.  Cardiology consulted for preop eval in case he needs surgery.  Patient also developed worsening AKI with significant azotemia.  IV fluid continued.  Nephrology consulted.  Renal ultrasound without acute finding.  Subjective: Seen and examined earlier this morning.  Spiked fever to 101.4 Fahrenheit overnight.  No complaint this morning.  He denies chest pain, dyspnea, nausea, vomiting, abdominal pain or UTI symptoms.  He did not have much urine output.  About 2 L output from NG tube.  No BM or flatus.  Objective: Vitals:   10/04/19 0530 10/04/19 0730 10/04/19 0800 10/04/19 1130  BP: 113/81 111/76 111/76 115/72  Pulse: (!) 106 (!) 104 (!) 104 95  Resp: _0 Temp: 99.9 F (37.7 C) 98.4 F (36.9 C) 98.4 F (36.9 C) 98.4 F (36.9 C)  TempSrc: Oral Oral Oral Oral    SpO2: 100% 100% 100% 92%  Weight:      Height:        Intake/Output Summary (Last 24 hours) at 10/04/2019 1257 Last data filed at 10/04/2019 0300 Gross per 24 hour  Intake 4521.75 ml  Output 2100 ml  Net 2421.75 ml   Filed Weights   09/30/19 0437 10/02/19 0500 10/03/19 0434  Weight: 54.9 kg 55.3 kg 54.7 kg    Examination:  GENERAL: Frail and chronically ill-appearing.  No apparent distress.  Nontoxic. HEENT: MMM.  NG tube in place. NECK: Supple.  No apparent JVD.  RESP: 100% on room air.  No IWOB.  Fair aeration bilaterally. CVS:  RRR. Heart sounds normal.  ABD/GI/GU: BS+. Abd soft, NTND.  MSK/EXT:  Moves extremities.  Significant muscle mass and subcu fat loss. SKIN: no apparent skin lesion or wound NEURO: Awake, alert and oriented appropriately.  No apparent focal neuro deficit. PSYCH: Calm. Normal affect.  Procedures:  6/6-6/9 NG tube for SBO 6/11-NG tube replaced.  Microbiology summarized: COVID-19 PCR negative.  Assessment & Plan: Small bowel obstruction-repeat CT abdomen and pelvis on 6/11 with persistent or recurrent SBO. Intractable nausea and vomiting -Reconsulted general surgery-NG tube replaced.  About 2 L output over the last 24 hours. -Consulted gastroenterology-appreciate input. -Antiemetics and IV fluid -Cardiology consulted for preop eval if SBO does not resolve with conservative measures.  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  BUN 51 >83> 94> 106.  About 250 cc UOP charted.  Renal ultrasound without acute finding.  FENa  1.5% -IV fluid per nephrology -Avoid nephrotoxic meds -Continue monitoring.  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  Hypernatremia: Na 149> 143>136.  Resolved. -IV fluid as above.  Hypokalemia/hypomagnesemia: Likely due to GI loss.  Resolved.  Fever: Isolated fever to 101.4 overnight.  WBC 9.5 (was 5.4 yesterday). Unclear etiology of this.   -Continue monitoring -Further work-up if he spikes fever again.  Chronic diastolic CHF: Echo on 5/78/4696 with EF of 55%, G2 DD and RVSP of 33.5.  He is probably hypovolemic from GI loss.  No cardiopulmonary symptoms.  Followed by Dr. Haroldine Laws. -Monitor fluid status while on IV fluid -Hold home diuretics  Pulmonary hypertension: Echo as above. -Resume home Opsumit and Revatio once he started tolerating p.o.  Scleroderma-stable.  Followed by Dr. Estanislado Pandy -Continue home Norvasc, sildenafil  History of CAD s/p PCA-no anginal symptoms.  Followed by cardiology, Dr. 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  H&H relatively stable. -Per nephrology -Continue monitoring.  Debility/physical deconditioning due to acute illness and hospitalization -PT/OT eval  Severe malnutrition:Body mass index is 18.34 kg/m.  Significant muscle mass and subcu fat loss. -Appreciate dietitian input Nutrition Problem: Severe Malnutrition Etiology: chronic illness (CHF, scleroderma) Signs/Symptoms: moderate fat depletion, severe fat depletion, moderate muscle depletion, severe muscle depletion Interventions: Ensure Enlive (each supplement provides 350kcal and 20 grams of protein), Magic cup, MVI   DVT prophylaxis: Subcu heparin Code Status: Full code Family Communication: Updated patient's wife at the bedside. Status is: Inpatient  Remains inpatient appropriate because:IV treatments appropriate due to intensity of illness or inability to take PO, Inpatient level of care appropriate due to severity of illness and Ongoing emesis   Dispo: The patient is from: Home              Anticipated d/c is to: Home              Anticipated d/c date is: 3 days              Patient currently is not medically stable to d/c.       Consultants:  General surgery Gastroenterology Nephrology Cardiology  Sch Meds:  Scheduled Meds: .  heparin  5,000 Units Subcutaneous Q8H  . sodium chloride flush  3 mL Intravenous Once   Continuous Infusions: . dextrose 5 % and 0.9% NaCl 125 mL/hr at 10/04/19 1107   PRN Meds:.acetaminophen, acetaminophen, bisacodyl, labetalol, ondansetron **OR** ondansetron (ZOFRAN) IV, phenol, prochlorperazine  Antimicrobials: Anti-infectives (From admission, onward)   None       I have personally reviewed the following labs and images: CBC: Recent Labs  Lab 09/29/19 0425 09/30/19 0358 10/01/19 0358 10/02/19 0403 10/04/19 0633  WBC 4.7 4.8 5.0 5.4 9.5  HGB 9.2* 9.8* 9.5* 10.0* 10.8*  HCT 30.5* 32.6* 31.5* 32.9* 34.4*  MCV 100.0 100.9* 102.6* 99.4 97.2  PLT 166 190 176 206 179   BMP &GFR Recent Labs  Lab 09/29/19 1159 09/30/19 0358 10/01/19 0358 10/01/19 1301 10/02/19 0403 10/03/19 0451 10/04/19 0633  NA  --    < > 149* 144 143 136 135  K  --    < > 4.2 4.0 3.5 3.3* 3.9  CL  --    < > 115* 111 104 93* 94*  CO2  --    < > 20* _0 GLUCOSE  --    < > 83 107* 99 103* 96  BUN  --    < >  83* 78* 82* 94* 106*  CREATININE  --    < > 4.52* 4.01* 4.19* 7.32* 7.58*  CALCIUM  --    < > 8.3* 8.3* 8.8* 9.0 8.4*  MG 1.0*  --  3.0*  --  2.7* 2.8* 2.3  PHOS  --   --   --   --  4.8* 7.0* 7.3*   < > = values in this interval not displayed.   Estimated Creatinine Clearance: 8 mL/min (A) (by C-G formula based on SCr of 7.58 mg/dL (H)). Liver & Pancreas: Recent Labs  Lab 09/28/19 1310 09/28/19 1310 09/29/19 0425 09/30/19 0358 10/02/19 0403 10/03/19 0451 10/04/19 0633  AST 22  --  16 15  --   --   --   ALT 15  --  12 12  --   --   --   ALKPHOS 77  --  66 67  --   --   --   BILITOT 0.6  --  0.4 0.4  --   --   --   PROT 8.0  --  7.0 7.0  --   --   --   ALBUMIN 4.2   < > 3.7 3.9 3.7 4.1 3.4*   < > = values in this interval not displayed.   Recent Labs  Lab 09/28/19 1310  LIPASE 40   No results for input(s): AMMONIA in the last 168 hours. Diabetic: No results for  input(s): HGBA1C in the last 72 hours. No results for input(s): GLUCAP in the last 168 hours. Cardiac Enzymes: No results for input(s): CKTOTAL, CKMB, CKMBINDEX, TROPONINI in the last 168 hours. No results for input(s): PROBNP in the last 8760 hours. Coagulation Profile: No results for input(s): INR, PROTIME in the last 168 hours. Thyroid Function Tests: No results for input(s): TSH, T4TOTAL, FREET4, T3FREE, THYROIDAB in the last 72 hours. Lipid Profile: No results for input(s): CHOL, HDL, LDLCALC, TRIG, CHOLHDL, LDLDIRECT in the last 72 hours. Anemia Panel: Recent Labs    10/02/19 1209  VITAMINB12 277  FOLATE 16.1  FERRITIN 603*  TIBC 266  IRON 26*  RETICCTPCT 1.5   Urine analysis:    Component Value Date/Time   COLORURINE YELLOW 10/03/2019 2300   APPEARANCEUR HAZY (A) 10/03/2019 2300   LABSPEC 1.016 10/03/2019 2300   PHURINE 5.0 10/03/2019 2300   GLUCOSEU NEGATIVE 10/03/2019 2300   HGBUR NEGATIVE 10/03/2019 2300   BILIRUBINUR SMALL (A) 10/03/2019 2300   KETONESUR NEGATIVE 10/03/2019 2300   PROTEINUR 100 (A) 10/03/2019 2300   NITRITE NEGATIVE 10/03/2019 2300   LEUKOCYTESUR NEGATIVE 10/03/2019 2300   Sepsis Labs: Invalid input(s): PROCALCITONIN, [place]  Microbiology: Recent Results (from the past 240 hour(s))  SARS Coronavirus 2 by RT PCR (hospital order, performed in [facility] hospital lab) Nasopharyngeal Nasopharyngeal Swab     Status: None   Collection Time: 09/28/19  4:59 PM   Specimen: Nasopharyngeal Swab  Result Value Ref Range Status   SARS Coronavirus 2 NEGATIVE NEGATIVE Final    Comment: (NOTE) SARS-CoV-2 target nucleic acids are NOT DETECTED. The SARS-CoV-2 RNA is generally detectable in upper and lower respiratory specimens during the acute phase of infection. The lowest concentration of SARS-CoV-2 viral copies this assay can detect is 250 copies / mL. A negative result does not preclude SARS-CoV-2 infection and should not be used as the sole  basis for treatment or other patient management decisions.  A negative result may occur with improper specimen collection / handling, submission of specimen other than  nasopharyngeal swab, presence of viral mutation(s) within the areas targeted by this assay, and inadequate number of viral copies (<250 copies / mL). A negative result must be combined with clinical observations, patient history, and epidemiological information. Fact Sheet for Patients:   StrictlyIdeas.no Fact Sheet for Healthcare Providers: BankingDealers.co.za This test is not yet approved or cleared  by the [place] FDA and has been authorized for detection and/or diagnosis of SARS-CoV-2 by FDA under an Emergency Use Authorization (EUA).  This EUA will remain in effect (meaning this test can be used) for the duration of the COVID-19 declaration under Section 564(b)(1) of the Act, 21 U.S.C. section 360bbb-3(b)(1), unless the authorization is terminated or revoked sooner. Performed at [facility], [place] [address] [place], [place] [address]     Radiology Studies: CT ABDOMEN PELVIS WO CONTRAST  Result Date: 10/03/2019 CLINICAL DATA:  Nausea and vomiting. Small-bowel obstruction on CT 09/28/2019. Improvement in clinical symptoms and symptoms return. EXAM: CT ABDOMEN AND PELVIS WITHOUT CONTRAST TECHNIQUE: Multidetector CT imaging of the abdomen and pelvis was performed following the standard protocol without IV contrast. COMPARISON:  CT 09/28/2019 FINDINGS: Lower chest: Moderate right pleural effusion is only partially included. Adjacent compressive atelectasis. Subpleural reticulation within both lung bases. There is enteric contrast in the distal esophagus which is dilated. Coronary artery calcifications. Hepatobiliary: No evidence of focal hepatic lesion on noncontrast exam. Branching calcifications in the left liver likely vascular. Calcified gallstones within  decompressed gallbladder. Pancreas: No ductal dilatation or inflammation. Spleen: Normal in size without focal abnormality. Adrenals/Urinary Tract: No adrenal nodule. No hydronephrosis. No significant perinephric edema. No renal calculi. Urinary bladder is physiologically distended. Stomach/Bowel: Enteric tube in the stomach. The distal esophagus is dilated with enteric contrast. Mild gastric dilatation. Small bowel are diffusely dilated and fluid/contrast filled. Transition point in the right mid abdomen, series 3, image 56 with mild small bowel wall thickening, also seen on prior exam. No pneumatosis. The more distal small bowel are decompressed. Small to moderate volume of stool throughout the colon. No colonic wall thickening. Normal appendix tentatively visualized, series 3, image 66. Vascular/Lymphatic: Advanced aortic and branch atherosclerosis. No aortic aneurysm. No bulky abdominopelvic adenopathy. Reproductive: Prostate is unremarkable. Other: Small amount of perihepatic free fluid.  No free air. Musculoskeletal: There are no acute or suspicious osseous abnormalities. IMPRESSION: 1. Persistent or recurrent small bowel obstruction with transition point in the right mid abdomen. There is small bowel wall thickening at the site of transition. 2. Small amount of perihepatic free fluid. No free air. 3. Moderate right pleural effusion is only partially included. 4. Cholelithiasis. Aortic Atherosclerosis (ICD10-I70.0). Electronically Signed   By: Keith Rake M.D.   On: 10/03/2019 20:46   US RENAL  Result Date: 10/03/2019 CLINICAL DATA:  Acute kidney injury. EXAM: RENAL / URINARY TRACT ULTRASOUND COMPLETE COMPARISON:  Abdominal CT 09/28/2018 FINDINGS: Right Kidney: Renal measurements: AP 1 x 4.8 x 4.7 cm = volume: 96 mL. Diffusely increased renal echogenicity. Mild thinning of the renal parenchyma. No hydronephrosis. No evidence of focal lesion. Left Kidney: Renal measurements: 9.7 x 5.9 x 5.1 cm =  volume: 152 mL. Slight increased renal echogenicity. No hydronephrosis. No focal lesion. Bladder: Appears normal for degree of bladder distention. Other: Ascites and right pleural effusion, also seen on recent CT. IMPRESSION: 1. Increased bilateral renal echogenicity consistent with chronic medical renal disease. Mild right renal atrophy. No obstructive uropathy. 2. Small volume ascites and right pleural effusion, as seen on recent CT. Electronically Signed  By: Keith Rake M.D.   On: 10/03/2019 17:03   DG Abd Portable 1V  Result Date: 10/04/2019 CLINICAL DATA:  Nausea and vomiting. Evaluate for small bowel obstruction. EXAM: PORTABLE ABDOMEN - 1 VIEW COMPARISON:  10/03/2019 FINDINGS: Nasogastric tube has been pulled back and has tip over the stomach in the left upper quadrant and side-port over the distal esophagus above the gastroesophageal junction. Bowel gas pattern is nonobstructive. No free peritoneal air. Cardiomediastinal silhouette and remainder of the exam is unchanged. IMPRESSION: 1.  Nonobstructive bowel gas pattern. 2. Nasogastric tube has been pulled back slightly as tip is over the stomach in the left upper quadrant and side-port over the distal esophagus. This could be advanced 6-7 cm. Electronically Signed   By: Marin Olp M.D.   On: 10/04/2019 11:37   DG Abd Portable 1V  Result Date: 10/03/2019 CLINICAL DATA:  59 year old male enteric tube placement. EXAM: PORTABLE ABDOMEN - 1 VIEW COMPARISON:  10/02/2019 KUB and earlier. FINDINGS: Portable AP supine view at 1446 hours. Enteric tube terminates in the left upper quadrant, side hole at the level of the gastric cardia. Bowel-gas pattern has not significantly changed from the CT on 09/28/2019, and might indicate continued fluid-filled small bowel loops throughout the abdomen. Right pleural effusion is less apparent. Lung bases appear stable. No acute osseous abnormality identified. IMPRESSION: 1. Enteric tube placed into the stomach,  side hole at the level of the gastric cardia. 2. Stable bowel gas pattern since 09/28/2019, suspicious for ongoing fluid-filled small bowel. Electronically Signed   By: Genevie Ann M.D.   On: 10/03/2019 15:14      Aleysha Meckler T. [place]  If 7PM-7AM, please contact night-coverage [email or web address] Password [facility] 10/04/2019, 12:57 PM

## 2019-10-04 NOTE — Progress Notes (Signed)
Pt's MEWS is yellow. Temp 101.4                                      BP 101/71                                       HR 110                                       RR16                                       O2 100  Pt is alert and oriented X4; no pain. [facility] is notified at 0340 about temp being 99.63F. A new order for  Tylenol rectal PRN is placed and administered at 0356. Pt is educated about using IS.
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
## 2019-10-04 NOTE — Progress Notes (Addendum)
[name]  Assessment/ Plan: Pt is a 59 y.o. yo male  with history of hypertension, CAD status post stent, HLD, scleroderma, systolic CHF, CKD stage IV, biopsy-proven membranous GN PLA 2R antibody positive followed by Dr. Joelyn Oms at [facility] admitted with a small bowel obstruction, seen as a consultation at the request of Dr. Cyndia Skeeters for AKI on CKD.B/l cr around 2.5.  #Acute kidney injury on CKD stage IV likely hemodynamically mediated due to ongoing nausea vomiting and GI loss.  CKD stage IV due to scleroderma and biopsy-proven PLA 2R Ab positive membranous GN. UA with no RBC however has chronic proteinuria.  Kidney ultrasound ruled out obstruction however has chronic changes. He received a liter of NS yesterday without much improvement in BUN and creatinine level.  He has increased GI loss via NG tube suction.  He still looks very dry and cachectic.  Urine output is minimal.  I will order another 1.5 L of NS bolus and increase the maintenance fluid. Hopefully the creatinine level will plateau in couple of days. I have discussed with the patient and his wife that if there is no improvement in renal function or any associated symptoms then he may need dialysis.  The family would like to avoid dialysis if possible.  No urgent indication for dialysis at the moment. Please avoid hypotensive episode, no IV contrast.  #Small bowel obstruction/nausea vomiting: Getting repeat CT scan.  Has NG tube and management per GI and general surgery.  #Hypokalemia: Replete potassium chloride.  Monitor lab.  #Anemia of CKD: Iron saturation was 10%.  He received IV iron.  Order a dose of Aranesp.  Hemoglobin at goal.  #CKD-MBD: Phosphorus level significantly elevated.  He is currently n.p.o.  Start binders when able to eat.  #Hypertension: Blood pressure acceptable at the moment.  #Severe protein calorie malnutrition with ongoing GI history: Seen by dietitian.  Currently  n.p.o.  #Acute febrile illness: Temperature 101 earlier this morning.  Not on antibiotics.  Evaluation per primary team.  Subjective: Seen and examined at bedside.  Still remaining on NG tube with 1.8 L of GI secretion.  Urine output is minimal only 250 cc.  He denies nausea vomiting chest pain or shortness of breath.  His wife at bedside. Objective Vital signs in last 24 hours: Vitals:   10/04/19 0303 10/04/19 0350 10/04/19 0530 10/04/19 0800  BP: 96/71 101/71 113/81 111/76  Pulse: 100 (!) 110 (!) 106 (!) 104  Resp: _0 Temp: 99.8 F (37.7 C) (!) 101.4 F (38.6 C) 99.9 F (37.7 C) 98.4 F (36.9 C)  TempSrc: Oral Oral Oral Oral  SpO2: 100% 100% 100% 100%  Weight:      Height:       Weight change:   Intake/Output Summary (Last 24 hours) at 10/04/2019 1017 Last data filed at 10/04/2019 0300 Gross per 24 hour  Intake 4521.75 ml  Output 2100 ml  Net 2421.75 ml       Labs: Basic Metabolic Panel: Recent Labs  Lab 10/02/19 0403 10/03/19 0451 10/04/19 0633  NA 143 136 135  K 3.5 3.3* 3.9  CL 104 93* 94*  CO2 _1 GLUCOSE 99 103* 96  BUN 82* 94* 106*  CREATININE 4.19* 7.32* 7.58*  CALCIUM 8.8* 9.0 8.4*  PHOS 4.8* 7.0* 7.3*   Liver Function Tests: Recent Labs  Lab 09/28/19 1310 09/28/19 1310 09/29/19 0425 09/29/19 0425 09/30/19 0358 09/30/19 0358 10/02/19 0403 10/03/19 0451 10/04/19 7915  AST 22  --  16  --  15  --   --   --   --   ALT 15  --  12  --  12  --   --   --   --   ALKPHOS 77  --  66  --  67  --   --   --   --   BILITOT 0.6  --  0.4  --  0.4  --   --   --   --   PROT 8.0  --  7.0  --  7.0  --   --   --   --   ALBUMIN 4.2   < > 3.7   < > 3.9   < > 3.7 4.1 3.4*   < > = values in this interval not displayed.   Recent Labs  Lab 09/28/19 1310  LIPASE 40   No results for input(s): AMMONIA in the last 168 hours. CBC: Recent Labs  Lab 09/29/19 0425 09/29/19 0425 09/30/19 0358 09/30/19 0358 10/01/19 0358 10/02/19 0403  10/04/19 0633  WBC 4.7   < > 4.8   < > 5.0 5.4 9.5  HGB 9.2*   < > 9.8*   < > 9.5* 10.0* 10.8*  HCT 30.5*   < > 32.6*   < > 31.5* 32.9* 34.4*  MCV 100.0  --  100.9*  --  102.6* 99.4 97.2  PLT 166   < > 190   < > 176 206 179   < > = values in this interval not displayed.   Cardiac Enzymes: No results for input(s): CKTOTAL, CKMB, CKMBINDEX, TROPONINI in the last 168 hours. CBG: No results for input(s): GLUCAP in the last 168 hours.  Iron Studies:  Recent Labs    10/02/19 1209  IRON 26*  TIBC 266  FERRITIN 603*   Studies/Results: CT ABDOMEN PELVIS WO CONTRAST  Result Date: 10/03/2019 CLINICAL DATA:  Nausea and vomiting. Small-bowel obstruction on CT 09/28/2019. Improvement in clinical symptoms and symptoms return. EXAM: CT ABDOMEN AND PELVIS WITHOUT CONTRAST TECHNIQUE: Multidetector CT imaging of the abdomen and pelvis was performed following the standard protocol without IV contrast. COMPARISON:  CT 09/28/2019 FINDINGS: Lower chest: Moderate right pleural effusion is only partially included. Adjacent compressive atelectasis. Subpleural reticulation within both lung bases. There is enteric contrast in the distal esophagus which is dilated. Coronary artery calcifications. Hepatobiliary: No evidence of focal hepatic lesion on noncontrast exam. Branching calcifications in the left liver likely vascular. Calcified gallstones within decompressed gallbladder. Pancreas: No ductal dilatation or inflammation. Spleen: Normal in size without focal abnormality. Adrenals/Urinary Tract: No adrenal nodule. No hydronephrosis. No significant perinephric edema. No renal calculi. Urinary bladder is physiologically distended. Stomach/Bowel: Enteric tube in the stomach. The distal esophagus is dilated with enteric contrast. Mild gastric dilatation. Small bowel are diffusely dilated and fluid/contrast filled. Transition point in the right mid abdomen, series 3, image 56 with mild small bowel wall thickening, also  seen on prior exam. No pneumatosis. The more distal small bowel are decompressed. Small to moderate volume of stool throughout the colon. No colonic wall thickening. Normal appendix tentatively visualized, series 3, image 66. Vascular/Lymphatic: Advanced aortic and branch atherosclerosis. No aortic aneurysm. No bulky abdominopelvic adenopathy. Reproductive: Prostate is unremarkable. Other: Small amount of perihepatic free fluid.  No free air. Musculoskeletal: There are no acute or suspicious osseous abnormalities. IMPRESSION: 1. Persistent or recurrent small bowel obstruction with transition point in the right mid abdomen. There  is small bowel wall thickening at the site of transition. 2. Small amount of perihepatic free fluid. No free air. 3. Moderate right pleural effusion is only partially included. 4. Cholelithiasis. Aortic Atherosclerosis (ICD10-I70.0). Electronically Signed   By: Keith Rake M.D.   On: 10/03/2019 20:46   US RENAL  Result Date: 10/03/2019 CLINICAL DATA:  Acute kidney injury. EXAM: RENAL / URINARY TRACT ULTRASOUND COMPLETE COMPARISON:  Abdominal CT 09/28/2018 FINDINGS: Right Kidney: Renal measurements: AP 1 x 4.8 x 4.7 cm = volume: 96 mL. Diffusely increased renal echogenicity. Mild thinning of the renal parenchyma. No hydronephrosis. No evidence of focal lesion. Left Kidney: Renal measurements: 9.7 x 5.9 x 5.1 cm = volume: 152 mL. Slight increased renal echogenicity. No hydronephrosis. No focal lesion. Bladder: Appears normal for degree of bladder distention. Other: Ascites and right pleural effusion, also seen on recent CT. IMPRESSION: 1. Increased bilateral renal echogenicity consistent with chronic medical renal disease. Mild right renal atrophy. No obstructive uropathy. 2. Small volume ascites and right pleural effusion, as seen on recent CT. Electronically Signed   By: Keith Rake M.D.   On: 10/03/2019 17:03   DG Abd Portable 1V  Result Date: 10/03/2019 CLINICAL DATA:   59 year old male enteric tube placement. EXAM: PORTABLE ABDOMEN - 1 VIEW COMPARISON:  10/02/2019 KUB and earlier. FINDINGS: Portable AP supine view at 1446 hours. Enteric tube terminates in the left upper quadrant, side hole at the level of the gastric cardia. Bowel-gas pattern has not significantly changed from the CT on 09/28/2019, and might indicate continued fluid-filled small bowel loops throughout the abdomen. Right pleural effusion is less apparent. Lung bases appear stable. No acute osseous abnormality identified. IMPRESSION: 1. Enteric tube placed into the stomach, side hole at the level of the gastric cardia. 2. Stable bowel gas pattern since 09/28/2019, suspicious for ongoing fluid-filled small bowel. Electronically Signed   By: Genevie Ann M.D.   On: 10/03/2019 15:14   DG Abd Portable 1V  Result Date: 10/02/2019 CLINICAL DATA:  Follow-up small bowel obstruction EXAM: PORTABLE ABDOMEN - 1 VIEW COMPARISON:  09/29/2019 and prior exams FINDINGS: No bowel dilatation is identified. A small to moderate amount of stool in the colonic hepatic flexure noted. No acute abnormalities are identified. NG tube is not visualized. IMPRESSION: No dilated bowel loops identified. Electronically Signed   By: Margarette Canada M.D.   On: 10/02/2019 15:14    Medications: Infusions: . dextrose 5 % and 0.9% NaCl 100 mL/hr at 10/04/19 0346  . sodium chloride      Scheduled Medications: . heparin  5,000 Units Subcutaneous Q8H  . sodium chloride flush  3 mL Intravenous Once    have reviewed scheduled and prn medications.  Physical Exam: General: Cachectic older looking male lying on bed with NG tube in place Heart:RRR, s1s2 nl, no rubs Lungs:clear b/l, no crackle Abdomen:soft, Non-tender, non-distended Extremities:No LE edema Neurology: Alert, awake and following commands.  No asterixis  [name] Prasad Margalit Leece 10/04/2019,10:17 AM  LOS: 6 days  Pager: [phone]

## 2019-10-04 NOTE — Progress Notes (Addendum)
[facility] Progress Note  CC:  N/V  Subjective:  NGT in place.  Wife and patient says that he keeps getting a little confused.  CT scan yesterday showed the following:  IMPRESSION: 1. Persistent or recurrent small bowel obstruction with transition point in the right mid abdomen. There is small bowel wall thickening at the site of transition. 2. Small amount of perihepatic free fluid. No free air. 3. Moderate right pleural effusion is only partially included. 4. Cholelithiasis.  Aortic Atherosclerosis (ICD10-I70.0).  Abdominal x-ray from this AM shows non-obstructive bowel gas pattern.  Objective:  Vital signs in last 24 hours: Temp:  [98 F (36.7 C)-101.4 F (38.6 C)] 98.4 F (36.9 C) (06/12 0800) Pulse Rate:  [95-110] 104 (06/12 0800) Resp:  [14-16] 16 (06/12 0530) BP: (96-122)/(68-82) 111/76 (06/12 0800) SpO2:  [98 %-100 %] 100 % (06/12 0800) Last BM Date: 10/02/19 General:  Alert, Well-developed, in NAD Heart:  Slightly tachy; no murmurs Pulm:  CTAB.  No increased WOB. Abdomen:  Soft, non-distended.  BS present but quiet.  Non-tender. Extremities:  Without edema. Neurologic:  Alert and oriented x 4;  grossly normal neurologically. Psych:  Alert and cooperative. Normal mood and affect.  Intake/Output from previous day: 06/11 0701 - 06/12 0700 In: 4641.8 [P.O.:300; I.V.:3094.3; IV Piggyback:1247.4] Out: 2100 [Urine:250; Emesis/NG output:1850]  Lab Results: Recent Labs    10/02/19 0403 10/04/19 0633  WBC 5.4 9.5  HGB 10.0* 10.8*  HCT 32.9* 34.4*  PLT 206 179   BMET Recent Labs    10/02/19 0403 10/03/19 0451 10/04/19 0633  NA 143 136 135  K 3.5 3.3* 3.9  CL 104 93* 94*  CO2 _0 GLUCOSE 99 103* 96  BUN 82* 94* 106*  CREATININE 4.19* 7.32* 7.58*  CALCIUM 8.8* 9.0 8.4*   LFT Recent Labs    10/04/19 0633  ALBUMIN 3.4*   CT ABDOMEN PELVIS WO CONTRAST  Result Date: 10/03/2019 CLINICAL DATA:  Nausea and vomiting.  Small-bowel obstruction on CT 09/28/2019. Improvement in clinical symptoms and symptoms return. EXAM: CT ABDOMEN AND PELVIS WITHOUT CONTRAST TECHNIQUE: Multidetector CT imaging of the abdomen and pelvis was performed following the standard protocol without IV contrast. COMPARISON:  CT 09/28/2019 FINDINGS: Lower chest: Moderate right pleural effusion is only partially included. Adjacent compressive atelectasis. Subpleural reticulation within both lung bases. There is enteric contrast in the distal esophagus which is dilated. Coronary artery calcifications. Hepatobiliary: No evidence of focal hepatic lesion on noncontrast exam. Branching calcifications in the left liver likely vascular. Calcified gallstones within decompressed gallbladder. Pancreas: No ductal dilatation or inflammation. Spleen: Normal in size without focal abnormality. Adrenals/Urinary Tract: No adrenal nodule. No hydronephrosis. No significant perinephric edema. No renal calculi. Urinary bladder is physiologically distended. Stomach/Bowel: Enteric tube in the stomach. The distal esophagus is dilated with enteric contrast. Mild gastric dilatation. Small bowel are diffusely dilated and fluid/contrast filled. Transition point in the right mid abdomen, series 3, image 56 with mild small bowel wall thickening, also seen on prior exam. No pneumatosis. The more distal small bowel are decompressed. Small to moderate volume of stool throughout the colon. No colonic wall thickening. Normal appendix tentatively visualized, series 3, image 66. Vascular/Lymphatic: Advanced aortic and branch atherosclerosis. No aortic aneurysm. No bulky abdominopelvic adenopathy. Reproductive: Prostate is unremarkable. Other: Small amount of perihepatic free fluid.  No free air. Musculoskeletal: There are no acute or suspicious osseous abnormalities. IMPRESSION: 1. Persistent or recurrent small bowel obstruction with transition point in the  right mid abdomen. There is small bowel  wall thickening at the site of transition. 2. Small amount of perihepatic free fluid. No free air. 3. Moderate right pleural effusion is only partially included. 4. Cholelithiasis. Aortic Atherosclerosis (ICD10-I70.0). Electronically Signed   By: Keith Rake M.D.   On: 10/03/2019 20:46   US RENAL  Result Date: 10/03/2019 CLINICAL DATA:  Acute kidney injury. EXAM: RENAL / URINARY TRACT ULTRASOUND COMPLETE COMPARISON:  Abdominal CT 09/28/2018 FINDINGS: Right Kidney: Renal measurements: AP 1 x 4.8 x 4.7 cm = volume: 96 mL. Diffusely increased renal echogenicity. Mild thinning of the renal parenchyma. No hydronephrosis. No evidence of focal lesion. Left Kidney: Renal measurements: 9.7 x 5.9 x 5.1 cm = volume: 152 mL. Slight increased renal echogenicity. No hydronephrosis. No focal lesion. Bladder: Appears normal for degree of bladder distention. Other: Ascites and right pleural effusion, also seen on recent CT. IMPRESSION: 1. Increased bilateral renal echogenicity consistent with chronic medical renal disease. Mild right renal atrophy. No obstructive uropathy. 2. Small volume ascites and right pleural effusion, as seen on recent CT. Electronically Signed   By: Keith Rake M.D.   On: 10/03/2019 17:03   DG Abd Portable 1V  Result Date: 10/03/2019 CLINICAL DATA:  59 year old male enteric tube placement. EXAM: PORTABLE ABDOMEN - 1 VIEW COMPARISON:  10/02/2019 KUB and earlier. FINDINGS: Portable AP supine view at 1446 hours. Enteric tube terminates in the left upper quadrant, side hole at the level of the gastric cardia. Bowel-gas pattern has not significantly changed from the CT on 09/28/2019, and might indicate continued fluid-filled small bowel loops throughout the abdomen. Right pleural effusion is less apparent. Lung bases appear stable. No acute osseous abnormality identified. IMPRESSION: 1. Enteric tube placed into the stomach, side hole at the level of the gastric cardia. 2. Stable bowel gas pattern  since 09/28/2019, suspicious for ongoing fluid-filled small bowel. Electronically Signed   By: Genevie Ann M.D.   On: 10/03/2019 15:14   DG Abd Portable 1V  Result Date: 10/02/2019 CLINICAL DATA:  Follow-up small bowel obstruction EXAM: PORTABLE ABDOMEN - 1 VIEW COMPARISON:  09/29/2019 and prior exams FINDINGS: No bowel dilatation is identified. A small to moderate amount of stool in the colonic hepatic flexure noted. No acute abnormalities are identified. NG tube is not visualized. IMPRESSION: No dilated bowel loops identified. Electronically Signed   By: Margarette Canada M.D.   On: 10/02/2019 15:14   Assessment / Plan: *   SBO resolved radiologically, NGT out then vomiting returned again.  Previous hx minor bloody coughing, spitting up in AM, was for EGD next month.  CT scan 6/11 showed the following:  IMPRESSION: 1. Persistent or recurrent small bowel obstruction with transition point in the right mid abdomen. There is small bowel wall thickening at the site of transition. 2. Small amount of perihepatic free fluid. No free air. 3. Moderate right pleural effusion is only partially included. 4. Cholelithiasis.  Aortic Atherosclerosis (ICD10-I70.0).  *   Progressive AKI.  ? Uremia contributing to N/V.  Pt followed by Dr Joelyn Oms.   *   Severe pulmonary htn.    *   TA colon polyp on sub-optimal bowel prep 08/2018.  Is for repeat colonoscopy next month.    *   Malnourished in appearance.  Albumin at normal limits.  *    Anemia of chronic disease.  Low iron, low iron sats.  Ferritin elevated.  Folate, B12 okay.  Received Feraheme 6/11.  Received Aranesp as well.  Hgb stable.  -Surgery following.  CT scan yesterday showed persistent/recurrent SBO, but abdominal x-ray this AM ok.  They plan to repeat x-ray again tomorrow and if does not improve the will need surgery on Monday.   LOS: 6 days   Laban Emperor. Zehr  10/04/2019, 11:17 AM      Attending Physician Note   I have taken an interval  history, reviewed the chart and examined the patient. I agree with the Advanced Practitioner's note, impression and recommendations.   Persistent, recurrent SBO with wall thickening at transition site per CT AP Continue NG suction and IVF Repeat abdominal films tomorrow Agree with plans for surgical mgmt if SBO not resolving over next couple of days GI available as needed  Lucio Edward, MD [facility]

## 2019-10-05 ENCOUNTER — Inpatient Hospital Stay (HOSPITAL_COMMUNITY): Payer: 59

## 2019-10-05 LAB — RENAL FUNCTION PANEL
Albumin: 2.7 g/dL — ABNORMAL LOW (ref 3.5–5.0)
Anion gap: 12 (ref 5–15)
BUN: 100 mg/dL — ABNORMAL HIGH (ref 6–20)
CO2: 21 mmol/L — ABNORMAL LOW (ref 22–32)
Calcium: 8.2 mg/dL — ABNORMAL LOW (ref 8.9–10.3)
Chloride: 108 mmol/L (ref 98–111)
Creatinine, Ser: 4.74 mg/dL — ABNORMAL HIGH (ref 0.61–1.24)
GFR calc Af Amer: 14 mL/min — ABNORMAL LOW (ref >60)
GFR calc non Af Amer: 12 mL/min — ABNORMAL LOW (ref >60)
Glucose, Bld: 107 mg/dL — ABNORMAL HIGH (ref 70–99)
Phosphorus: 6.2 mg/dL — ABNORMAL HIGH (ref 2.5–4.6)
Potassium: 3.6 mmol/L (ref 3.5–5.1)
Sodium: 141 mmol/L (ref 135–145)

## 2019-10-05 LAB — CBC
HCT: 30.6 % — ABNORMAL LOW (ref 39.0–52.0)
Hemoglobin: 9.5 g/dL — ABNORMAL LOW (ref 13.0–17.0)
MCH: 30.6 pg (ref 26.0–34.0)
MCHC: 31 g/dL (ref 30.0–36.0)
MCV: 98.7 fL (ref 80.0–100.0)
Platelets: 166 10*3/uL (ref 150–400)
RBC: 3.1 MIL/uL — ABNORMAL LOW (ref 4.22–5.81)
RDW: 15.3 % (ref 11.5–15.5)
WBC: 7 10*3/uL (ref 4.0–10.5)
nRBC: 0 % (ref 0.0–0.2)

## 2019-10-05 LAB — SURGICAL PCR SCREEN
MRSA, PCR: NEGATIVE
Staphylococcus aureus: NEGATIVE

## 2019-10-05 LAB — MAGNESIUM: Magnesium: 2.4 mg/dL (ref 1.7–2.4)

## 2019-10-05 IMAGING — DX DG ABD PORTABLE 1V
1 series · 1 of 1 positions shown · non-contrast
Comparison: [DATE]; [DATE]; CT abdomen pelvis-[DATE]

CLINICAL DATA: Small-bowel obstruction.  Vomiting

EXAM:
PORTABLE ABDOMEN - 1 VIEW

[abdomen kub]
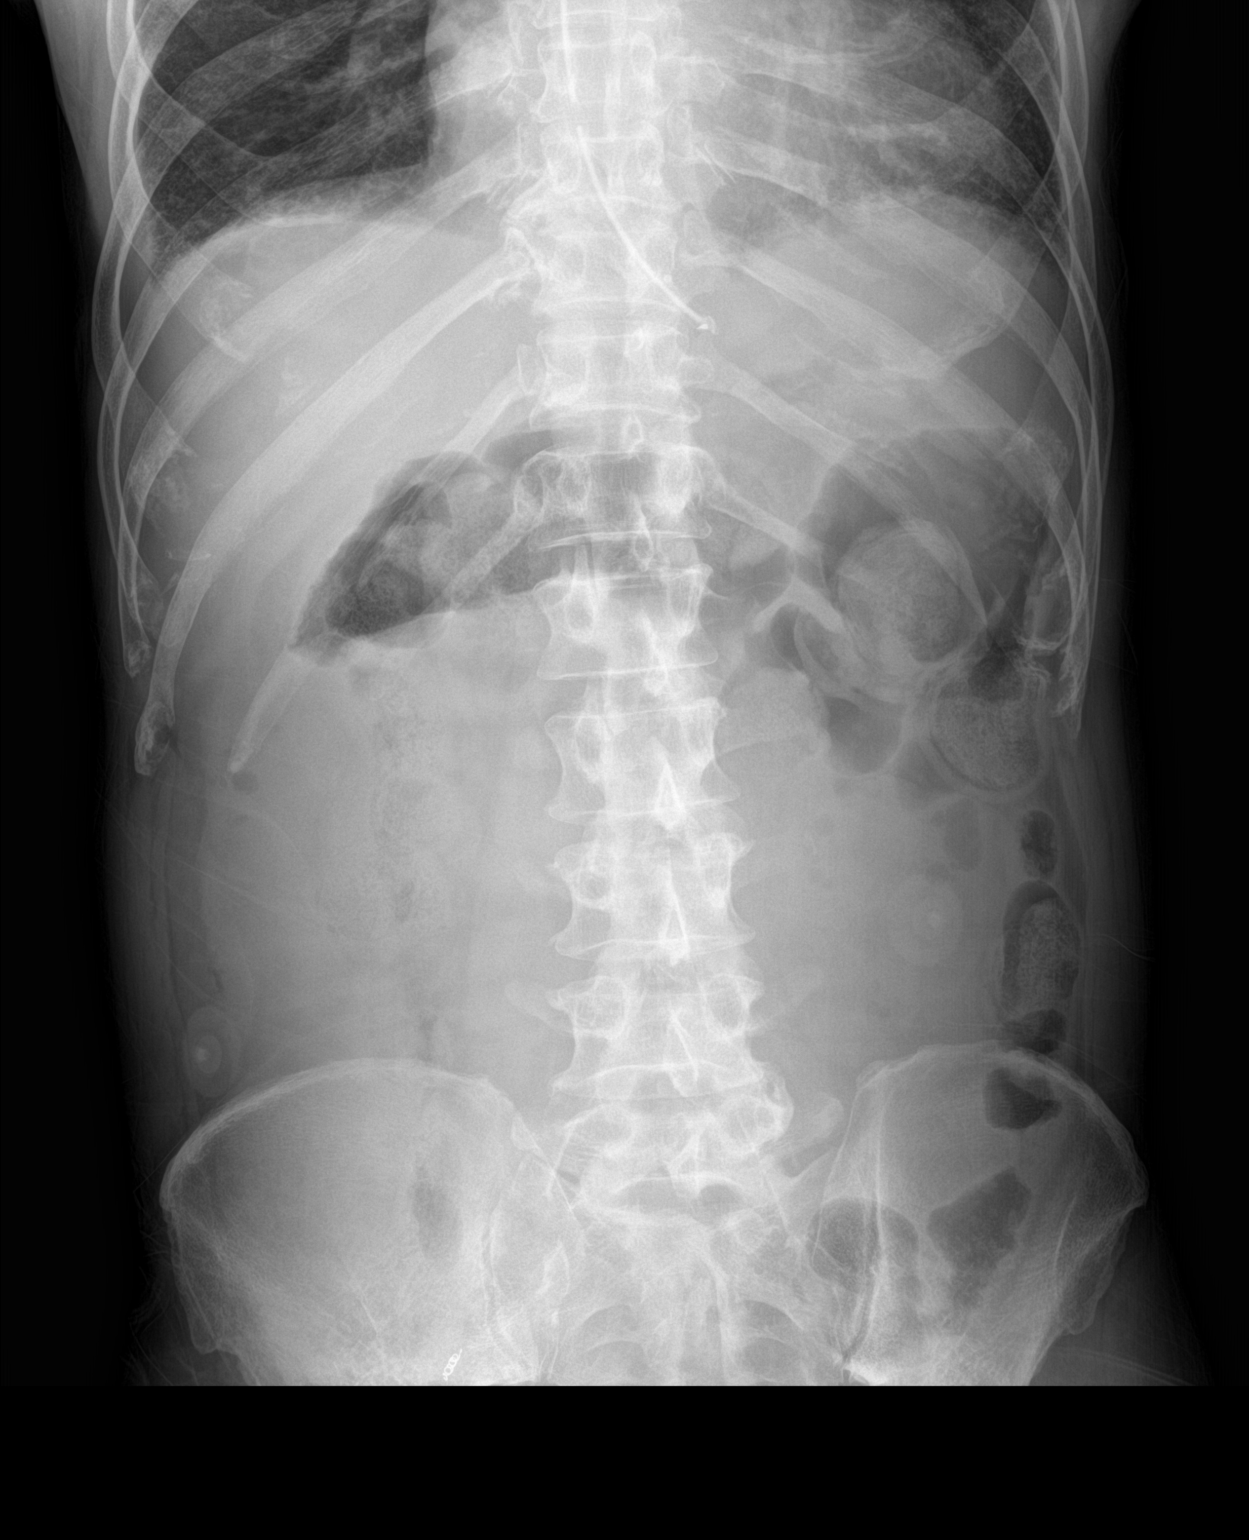

[1 of 1 positions shown; findings below may reference images not displayed]

FINDINGS: Enteric tube side port again projects over the distal esophagus.

Moderate colonic stool burden without evidence of enteric
obstruction. No supine evidence of pneumoperitoneum. No pneumatosis
or portal venous gas.

Embolization coil overlies the right lower pelvis. Vascular
calcifications overlie the pelvis bilaterally. No definitive
abnormal intra-abdominal calcifications.

Limited visualization of lower thorax suggests a trace right-sided
pleural effusion. No acute osseous abnormalities.
IMPRESSION: 1. Enteric tube side port projects over the distal esophagus.
Advancement at least 12 cm is advised.
2. Paucity of bowel gas without evidence of enteric obstruction.
3. Trace right-sided pleural effusion.

## 2019-10-05 MED ORDER — SODIUM CHLORIDE 0.9 % IV BOLUS
1500.0000 mL | Freq: Once | INTRAVENOUS | Status: AC
  Administered 2019-10-05: 1500 mL via INTRAVENOUS

## 2019-10-05 MED ORDER — SODIUM CHLORIDE 0.9 % IV SOLN: INTRAVENOUS | Status: DC

## 2019-10-05 MED ORDER — SODIUM CHLORIDE 0.9 % IV SOLN
2.0000 g | INTRAVENOUS | Status: AC
  Administered 2019-10-06: 2 g via INTRAVENOUS
  Filled 2019-10-05: qty 2

## 2019-10-05 NOTE — Plan of Care (Signed)
  Problem: Pain Managment: Goal: General experience of comfort will improve Outcome: Progressing   Problem: Safety: Goal: Ability to remain free from injury will improve Outcome: Progressing   Problem: Skin Integrity: Goal: Risk for impaired skin integrity will decrease Outcome: Progressing

## 2019-10-05 NOTE — Progress Notes (Signed)
[name] NEPHROLOGY PROGRESS NOTE  Assessment/ Plan: Pt is a 59 y.o. yo male  with history of hypertension, CAD status post stent, HLD, scleroderma, systolic CHF, CKD stage IV, biopsy-proven membranous GN PLA 2R antibody positive followed by Dr. Joelyn Oms at [facility] admitted with a small bowel obstruction, seen as a consultation at the request of Dr. Cyndia Skeeters for AKI on CKD.B/l cr around 2.5.  #Acute kidney injury on CKD stage IV likely hemodynamically mediated due to ongoing nausea vomiting and GI loss.  CKD stage IV due to scleroderma and biopsy-proven PLA 2R Ab positive membranous GN. UA with no RBC however has chronic proteinuria.  Kidney ultrasound ruled out obstruction however has chronic changes. He has been having very high output from the NG tube.  He received NS IV fluid bolus last 2 days and we increase the dose of maintenance fluid.  Today the creatinine level has significantly improved.  Hopefully the kidney function continue to improve to avoid dialysis. He still looks very dry and cachectic.  I am ordering another 1.5 L of NS bolus and we will continue maintenance IV fluid. No urgent indication for dialysis at the moment.  Since urine output is very low I will order bladder scan. Please avoid hypotensive episode, no IV contrast.  #Small bowel obstruction/nausea vomiting: No improvement with conservative management.  Plan for ex lap tomorrow.  Recommend to avoid hypotensive episode.  #Hypokalemia: Replete potassium chloride.  Monitor lab.  #Anemia of CKD: Iron saturation was 10%.  He received IV iron and Aranesp.  Hemoglobin at goal with some fluctuation.  #CKD-MBD: Phosphorus level significantly elevated.  He is currently n.p.o.  Start binders when able to eat.  #Hypertension: Blood pressure acceptable at the moment.  #Severe protein calorie malnutrition with ongoing GI history: Seen by dietitian.  Currently n.p.o.  #Acute febrile illness: Afebrile now.  Per  primary.  Subjective: Seen and examined at bedside.  Urine output is only 200 cc recorded.  Lab looks much better today.  Denies nausea vomiting chest pain shortness of breath.  His wife at bedside. Objective Vital signs in last 24 hours: Vitals:   10/04/19 1547 10/04/19 2014 10/05/19 0252 10/05/19 0810  BP: 98/67 104/65 101/66 117/73  Pulse: 100 98 92 92  Resp: _0 Temp: 100.1 F (37.8 C) 99.1 F (37.3 C) 98.4 F (36.9 C) 98.9 F (37.2 C)  TempSrc: Oral Oral Oral Oral  SpO2: 97% 97% 99%   Weight:      Height:       Weight change:   Intake/Output Summary (Last 24 hours) at 10/05/2019 1102 Last data filed at 10/05/2019 1000 Gross per 24 hour  Intake 0 ml  Output 2850 ml  Net -2850 ml       Labs: Basic Metabolic Panel: Recent Labs  Lab 10/03/19 0451 10/04/19 0633 10/05/19 0833  NA 136 135 141  K 3.3* 3.9 3.6  CL 93* 94* 108  CO2 27 23 21*  GLUCOSE 103* 96 107*  BUN 94* 106* 100*  CREATININE 7.32* 7.58* 4.74*  CALCIUM 9.0 8.4* 8.2*  PHOS 7.0* 7.3* 6.2*   Liver Function Tests: Recent Labs  Lab 09/28/19 1310 09/28/19 1310 09/29/19 0425 09/29/19 0425 09/30/19 0358 10/02/19 0403 10/03/19 0451 10/04/19 0633 10/05/19 0833  AST 22  --  16  --  15  --   --   --   --   ALT 15  --  12  --  12  --   --   --   --  ALKPHOS 77  --  66  --  67  --   --   --   --   BILITOT 0.6  --  0.4  --  0.4  --   --   --   --   PROT 8.0  --  7.0  --  7.0  --   --   --   --   ALBUMIN 4.2   < > 3.7   < > 3.9   < > 4.1 3.4* 2.7*   < > = values in this interval not displayed.   Recent Labs  Lab 09/28/19 1310  LIPASE 40   No results for input(s): AMMONIA in the last 168 hours. CBC: Recent Labs  Lab 09/30/19 0358 09/30/19 0358 10/01/19 0358 10/01/19 0358 10/02/19 0403 10/04/19 0633 10/05/19 0833  WBC 4.8   < > 5.0   < > 5.4 9.5 7.0  HGB 9.8*   < > 9.5*   < > 10.0* 10.8* 9.5*  HCT 32.6*   < > 31.5*   < > 32.9* 34.4* 30.6*  MCV 100.9*  --  102.6*  --  99.4  97.2 98.7  PLT 190   < > 176   < > 206 179 166   < > = values in this interval not displayed.   Cardiac Enzymes: No results for input(s): CKTOTAL, CKMB, CKMBINDEX, TROPONINI in the last 168 hours. CBG: No results for input(s): GLUCAP in the last 168 hours.  Iron Studies:  Recent Labs    10/02/19 1209  IRON 26*  TIBC 266  FERRITIN 603*   Studies/Results: CT ABDOMEN PELVIS WO CONTRAST  Result Date: 10/03/2019 CLINICAL DATA:  Nausea and vomiting. Small-bowel obstruction on CT 09/28/2019. Improvement in clinical symptoms and symptoms return. EXAM: CT ABDOMEN AND PELVIS WITHOUT CONTRAST TECHNIQUE: Multidetector CT imaging of the abdomen and pelvis was performed following the standard protocol without IV contrast. COMPARISON:  CT 09/28/2019 FINDINGS: Lower chest: Moderate right pleural effusion is only partially included. Adjacent compressive atelectasis. Subpleural reticulation within both lung bases. There is enteric contrast in the distal esophagus which is dilated. Coronary artery calcifications. Hepatobiliary: No evidence of focal hepatic lesion on noncontrast exam. Branching calcifications in the left liver likely vascular. Calcified gallstones within decompressed gallbladder. Pancreas: No ductal dilatation or inflammation. Spleen: Normal in size without focal abnormality. Adrenals/Urinary Tract: No adrenal nodule. No hydronephrosis. No significant perinephric edema. No renal calculi. Urinary bladder is physiologically distended. Stomach/Bowel: Enteric tube in the stomach. The distal esophagus is dilated with enteric contrast. Mild gastric dilatation. Small bowel are diffusely dilated and fluid/contrast filled. Transition point in the right mid abdomen, series 3, image 56 with mild small bowel wall thickening, also seen on prior exam. No pneumatosis. The more distal small bowel are decompressed. Small to moderate volume of stool throughout the colon. No colonic wall thickening. Normal appendix  tentatively visualized, series 3, image 66. Vascular/Lymphatic: Advanced aortic and branch atherosclerosis. No aortic aneurysm. No bulky abdominopelvic adenopathy. Reproductive: Prostate is unremarkable. Other: Small amount of perihepatic free fluid.  No free air. Musculoskeletal: There are no acute or suspicious osseous abnormalities. IMPRESSION: 1. Persistent or recurrent small bowel obstruction with transition point in the right mid abdomen. There is small bowel wall thickening at the site of transition. 2. Small amount of perihepatic free fluid. No free air. 3. Moderate right pleural effusion is only partially included. 4. Cholelithiasis. Aortic Atherosclerosis (ICD10-I70.0). Electronically Signed   By: Keith Rake M.D.   On:  10/03/2019 20:46   US RENAL  Result Date: 10/03/2019 CLINICAL DATA:  Acute kidney injury. EXAM: RENAL / URINARY TRACT ULTRASOUND COMPLETE COMPARISON:  Abdominal CT 09/28/2018 FINDINGS: Right Kidney: Renal measurements: AP 1 x 4.8 x 4.7 cm = volume: 96 mL. Diffusely increased renal echogenicity. Mild thinning of the renal parenchyma. No hydronephrosis. No evidence of focal lesion. Left Kidney: Renal measurements: 9.7 x 5.9 x 5.1 cm = volume: 152 mL. Slight increased renal echogenicity. No hydronephrosis. No focal lesion. Bladder: Appears normal for degree of bladder distention. Other: Ascites and right pleural effusion, also seen on recent CT. IMPRESSION: 1. Increased bilateral renal echogenicity consistent with chronic medical renal disease. Mild right renal atrophy. No obstructive uropathy. 2. Small volume ascites and right pleural effusion, as seen on recent CT. Electronically Signed   By: Keith Rake M.D.   On: 10/03/2019 17:03   DG Abd Portable 1V  Result Date: 10/05/2019 CLINICAL DATA:  Small-bowel obstruction.  Vomiting EXAM: PORTABLE ABDOMEN - 1 VIEW COMPARISON:  10/04/2019; 10/03/2019; CT abdomen pelvis-10/03/2019 FINDINGS: Enteric tube side port again projects  over the distal esophagus. Moderate colonic stool burden without evidence of enteric obstruction. No supine evidence of pneumoperitoneum. No pneumatosis or portal venous gas. Embolization coil overlies the right lower pelvis. Vascular calcifications overlie the pelvis bilaterally. No definitive abnormal intra-abdominal calcifications. Limited visualization of lower thorax suggests a trace right-sided pleural effusion. No acute osseous abnormalities. IMPRESSION: 1. Enteric tube side port projects over the distal esophagus. Advancement at least 12 cm is advised. 2. Paucity of bowel gas without evidence of enteric obstruction. 3. Trace right-sided pleural effusion. Electronically Signed   By: Sandi Mariscal M.D.   On: 10/05/2019 07:57   DG Abd Portable 1V  Result Date: 10/04/2019 CLINICAL DATA:  Nausea and vomiting. Evaluate for small bowel obstruction. EXAM: PORTABLE ABDOMEN - 1 VIEW COMPARISON:  10/03/2019 FINDINGS: Nasogastric tube has been pulled back and has tip over the stomach in the left upper quadrant and side-port over the distal esophagus above the gastroesophageal junction. Bowel gas pattern is nonobstructive. No free peritoneal air. Cardiomediastinal silhouette and remainder of the exam is unchanged. IMPRESSION: 1.  Nonobstructive bowel gas pattern. 2. Nasogastric tube has been pulled back slightly as tip is over the stomach in the left upper quadrant and side-port over the distal esophagus. This could be advanced 6-7 cm. Electronically Signed   By: Marin Olp M.D.   On: 10/04/2019 11:37   DG Abd Portable 1V  Result Date: 10/03/2019 CLINICAL DATA:  59 year old male enteric tube placement. EXAM: PORTABLE ABDOMEN - 1 VIEW COMPARISON:  10/02/2019 KUB and earlier. FINDINGS: Portable AP supine view at 1446 hours. Enteric tube terminates in the left upper quadrant, side hole at the level of the gastric cardia. Bowel-gas pattern has not significantly changed from the CT on 09/28/2019, and might indicate  continued fluid-filled small bowel loops throughout the abdomen. Right pleural effusion is less apparent. Lung bases appear stable. No acute osseous abnormality identified. IMPRESSION: 1. Enteric tube placed into the stomach, side hole at the level of the gastric cardia. 2. Stable bowel gas pattern since 09/28/2019, suspicious for ongoing fluid-filled small bowel. Electronically Signed   By: Genevie Ann M.D.   On: 10/03/2019 15:14    Medications: Infusions: . [START ON 10/06/2019] cefoTEtan (CEFOTAN) IV    . dextrose 5 % and 0.9% NaCl 125 mL/hr at 10/05/19 0958  . sodium chloride      Scheduled Medications: . heparin  5,000 Units Subcutaneous  Q8H  . sodium chloride flush  3 mL Intravenous Once    have reviewed scheduled and prn medications.  Physical Exam: General: Cachectic older looking male lying on bed with NG tube in place Heart:RRR, s1s2 nl, no rubs Lungs: Clear b/l, no crackle Abdomen:soft, Non-tender, non-distended Extremities:No LE edema Neurology: Alert, awake and following commands.  No asterixis  Batina Dougan Tanna Furry 10/05/2019,11:02 AM  LOS: 7 days  Pager: [phone]

## 2019-10-05 NOTE — Progress Notes (Signed)
PROGRESS NOTE  [name]:[id number] DOB: 10/12/1960   PCP: [name], MD  Patient is from: Home.  DOA: 09/28/2019 LOS: 7  Brief Narrative / Interim history: 59 year old male with history of systolic CHF, scleroderma, HTN, CAD/PCI, HLD and malnutrition presenting with 2 days of nausea and vomiting and found to have small bowel obstruction.  He was managed conservatively with NG tube bowel decompression and IV fluid with improvement in symptoms.  NG tube discontinued on 10/01/2019.  He was cleared for discharge by general surgery from surgical standpoint. However, patient has significant emesis after NG tube removal.  General surgery recommended reinserting NGT.  Initially, patient refused NG tube replacement but he continued to have nausea and vomiting without bowel movement.  KUB without signs of SBO.  Attempted water enema without significant results continue to have intractable nausea and vomiting.  General surgery reconsulted on 6/11.  NG tube replaced.  CT abdomen and pelvis ordered.  Lumbar GI consulted as well for possible ileus/constipation.  Repeat CT abdomen and pelvis with persistent or recurrent SBO.  Cardiology consulted for preop eval in case he needs surgery.  Patient also developed worsening AKI with significant azotemia. Nephrology consulted.  Renal ultrasound without acute finding.  Received IV fluid boluses with increased maintenance rate.  Renal function improved.  Subjective: Seen and examined earlier this morning.  No major events overnight of this morning.  Reports 3 small bowel movements yesterday but not overnight.  Not passing gas.  Feels thirsty.  He wishes the surgery could be done sooner so he can have NGT out and drink water.  He had about 3 L from NG tube in the last 24 hours.  Denies nausea, vomiting or abdominal pain.  Denies chest pain or dyspnea.  Objective: Vitals:   10/04/19 1547 10/04/19 2014 10/05/19 0252 10/05/19 0810  BP: 98/67 104/65  101/66 117/73  Pulse: 100 98 92 92  Resp: _0 Temp: 100.1 F (37.8 C) 99.1 F (37.3 C) 98.4 F (36.9 C) 98.9 F (37.2 C)  TempSrc: Oral Oral Oral Oral  SpO2: 97% 97% 99%   Weight:      Height:        Intake/Output Summary (Last 24 hours) at 10/05/2019 1302 Last data filed at 10/05/2019 1000 Gross per 24 hour  Intake 0 ml  Output 2850 ml  Net -2850 ml   Filed Weights   09/30/19 0437 10/02/19 0500 10/03/19 0434  Weight: 54.9 kg 55.3 kg 54.7 kg    Examination:  GENERAL: Frail and chronically ill-appearing.  No apparent distress.  Nontoxic. HEENT: MMM.  NG tube to wall suction. NECK: Supple.  No apparent JVD.  RESP: On room air.  No IWOB.  Fair aeration bilaterally. CVS:  RRR. Heart sounds normal.  ABD/GI/GU: BS+. Abd soft, NTND.  MSK/EXT:  Moves extremities.  Significant muscle mass and subcu fat loss. SKIN: no apparent skin lesion or wound NEURO: Awake, alert and oriented appropriately.  No apparent focal neuro deficit. PSYCH: Calm. Normal affect.  Procedures:  6/6-6/9 NG tube for SBO 6/11-NG tube replaced.  Microbiology summarized: COVID-19 PCR negative.  Assessment & Plan: Small bowel obstruction-repeat CT abdomen and pelvis on 6/11 with persistent or recurrent SBO. Intractable nausea and vomiting -Reconsulted general surgery-NG tube replaced.  Plan for ex lap on 6/14. -Consulted gastroenterology-appreciate input. -Antiemetics and IV fluid -Appreciate cardiology input.  Cleared for surgery.  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  BUN  51 >83>> 106> 100.  About 250 cc UOP charted.  Renal ultrasound without acute finding.  FENa 1.5%. -IV fluid per nephrology -Avoid nephrotoxic meds -Continue monitoring.  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  Hypernatremia: Na 149> 143> 141.  Resolved. -IV fluid as above.  Hypokalemia/hypomagnesemia: Likely due to GI loss.  Resolved.  Fever:  Isolated fever to 101.4 overnight.  WBC 9.5 (was 5.4 yesterday). Unclear etiology of this.  No further fever. -Continue monitoring -Further work-up if he spikes fever again.  Chronic diastolic CHF: Echo on 9/89/2119 with EF of 55%, G2-DD and RVSP of 33.28mHg.  He is probably hypovolemic from GI loss.  No cardiopulmonary symptoms.  Net negative due to GI loss.  Followed by Dr. BHaroldine Laws -Cardiology following. -Monitor fluid status while on IV fluid -Hold home diuretics  Pulmonary hypertension: Echo as above. RHC in 06/2018 with PA of 81/26 and PCWP of 17 -Resume home Opsumit and Revatio once he started tolerating p.o.  Scleroderma-stable.  Followed by Dr. DEstanislado Pandy-Continue home Norvasc, sildenafil  History of CAD s/p PCA-no anginal symptoms. 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  H&H relatively stable. -Per nephrology -Continue monitoring.  Debility/physical deconditioning due to acute illness and hospitalization -PT/OT eval  Severe malnutrition:Body mass index is 18.34 kg/m.  Significant muscle mass and subcu fat loss. -Appreciate dietitian input Nutrition Problem: Severe Malnutrition Etiology: chronic illness (CHF, scleroderma) Signs/Symptoms: moderate fat depletion, severe fat depletion, moderate muscle depletion, severe muscle depletion Interventions: Ensure Enlive (each supplement provides 350kcal and 20 grams of protein), Magic cup, MVI   DVT prophylaxis: Subcu heparin Code Status: Full code Family Communication: Updated patient's wife at the bedside. Status is: Inpatient  Remains inpatient appropriate because:IV treatments appropriate due to intensity of illness or inability to take PO, Inpatient level of care appropriate due to severity of illness and Ongoing emesis   Dispo: The patient is from: Home               Anticipated d/c is to: Home              Anticipated d/c date is: 3 days              Patient currently is not medically stable to d/c.       Consultants:  General surgery Gastroenterology Nephrology Cardiology  Sch Meds:  Scheduled Meds: . heparin  5,000 Units Subcutaneous Q8H  . sodium chloride flush  3 mL Intravenous Once   Continuous Infusions: . [START ON 10/06/2019] cefoTEtan (CEFOTAN) IV    . dextrose 5 % and 0.9% NaCl 125 mL/hr at 10/05/19 0958   PRN Meds:.acetaminophen, acetaminophen, bisacodyl, labetalol, ondansetron **OR** ondansetron (ZOFRAN) IV, phenol, prochlorperazine  Antimicrobials: Anti-infectives (From admission, onward)   Start     Dose/Rate Route Frequency Ordered Stop   10/06/19 0600  cefoTEtan (CEFOTAN) 2 g in sodium chloride 0.9 % 100 mL IVPB     Discontinue     2 g 200 mL/hr over 30 Minutes Intravenous On call to O.R. 10/05/19 0810 10/07/19 0559       I have personally reviewed the following labs and images: CBC: Recent Labs  Lab 09/30/19 0358 10/01/19 0358 10/02/19 0403 10/04/19 0633 10/05/19 0833  WBC 4.8 5.0 5.4 9.5 7.0  HGB 9.8* 9.5* 10.0* 10.8* 9.5*  HCT 32.6* 31.5*  32.9* 34.4* 30.6*  MCV 100.9* 102.6* 99.4 97.2 98.7  PLT 190 176 206 179 166   BMP &GFR Recent Labs  Lab 10/01/19 0358 10/01/19 0358 10/01/19 1301 10/02/19 0403 10/03/19 0451 10/04/19 0633 10/05/19 0833  NA 149*   < > 144 143 136 135 141  K 4.2   < > 4.0 3.5 3.3* 3.9 3.6  CL 115*   < > 111 104 93* 94* 108  CO2 20*   < > _0 21*  GLUCOSE 83   < > 107* 99 103* 96 107*  BUN 83*   < > 78* 82* 94* 106* 100*  CREATININE 4.52*   < > 4.01* 4.19* 7.32* 7.58* 4.74*  CALCIUM 8.3*   < > 8.3* 8.8* 9.0 8.4* 8.2*  MG 3.0*  --   --  2.7* 2.8* 2.3 2.4  PHOS  --   --   --  4.8* 7.0* 7.3* 6.2*   < > = values in this interval not displayed.   Estimated Creatinine Clearance: 12.8 mL/min (A) (by C-G formula based on SCr of 4.74 mg/dL (H)). Liver & Pancreas: Recent  Labs  Lab 09/28/19 1310 09/28/19 1310 09/29/19 0425 09/29/19 0425 09/30/19 0358 10/02/19 0403 10/03/19 0451 10/04/19 0633 10/05/19 0833  AST 22  --  16  --  15  --   --   --   --   ALT 15  --  12  --  12  --   --   --   --   ALKPHOS 77  --  66  --  67  --   --   --   --   BILITOT 0.6  --  0.4  --  0.4  --   --   --   --   PROT 8.0  --  7.0  --  7.0  --   --   --   --   ALBUMIN 4.2   < > 3.7   < > 3.9 3.7 4.1 3.4* 2.7*   < > = values in this interval not displayed.   Recent Labs  Lab 09/28/19 1310  LIPASE 40   No results for input(s): AMMONIA in the last 168 hours. Diabetic: No results for input(s): HGBA1C in the last 72 hours. No results for input(s): GLUCAP in the last 168 hours. Cardiac Enzymes: No results for input(s): CKTOTAL, CKMB, CKMBINDEX, TROPONINI in the last 168 hours. No results for input(s): PROBNP in the last 8760 hours. Coagulation Profile: No results for input(s): INR, PROTIME in the last 168 hours. Thyroid Function Tests: No results for input(s): TSH, T4TOTAL, FREET4, T3FREE, THYROIDAB in the last 72 hours. Lipid Profile: No results for input(s): CHOL, HDL, LDLCALC, TRIG, CHOLHDL, LDLDIRECT in the last 72 hours. Anemia Panel: No results for input(s): VITAMINB12, FOLATE, FERRITIN, TIBC, IRON, RETICCTPCT in the last 72 hours. Urine analysis:    Component Value Date/Time   COLORURINE YELLOW 10/03/2019 2300   APPEARANCEUR HAZY (A) 10/03/2019 2300   LABSPEC 1.016 10/03/2019 2300   PHURINE 5.0 10/03/2019 2300   GLUCOSEU NEGATIVE 10/03/2019 2300   HGBUR NEGATIVE 10/03/2019 2300   BILIRUBINUR SMALL (A) 10/03/2019 2300   KETONESUR NEGATIVE 10/03/2019 2300   PROTEINUR 100 (A) 10/03/2019 2300   NITRITE NEGATIVE 10/03/2019 2300   LEUKOCYTESUR NEGATIVE 10/03/2019 2300   Sepsis Labs: Invalid input(s): PROCALCITONIN, [place]  Microbiology: Recent Results (from the past 240 hour(s))  SARS Coronavirus 2 by RT PCR (hospital order, performed in [facility]  hospital lab) Nasopharyngeal Nasopharyngeal Swab     Status: None   Collection Time: 09/28/19  4:59 PM   Specimen: Nasopharyngeal Swab  Result Value Ref Range Status   SARS Coronavirus 2 NEGATIVE NEGATIVE Final    Comment: (NOTE) SARS-CoV-2 target nucleic acids are NOT DETECTED. The SARS-CoV-2 RNA is generally detectable in upper and lower respiratory specimens during the acute phase of infection. The lowest concentration of SARS-CoV-2 viral copies this assay can detect is 250 copies / mL. A negative result does not preclude SARS-CoV-2 infection and should not be used as the sole basis for treatment or other patient management decisions.  A negative result may occur with improper specimen collection / handling, submission of specimen other than nasopharyngeal swab, presence of viral mutation(s) within the areas targeted by this assay, and inadequate number of viral copies (<250 copies / mL). A negative result must be combined with clinical observations, patient history, and epidemiological information. Fact Sheet for Patients:   StrictlyIdeas.no Fact Sheet for Healthcare Providers: BankingDealers.co.za This test is not yet approved or cleared  by the [place] FDA and has been authorized for detection and/or diagnosis of SARS-CoV-2 by FDA under an Emergency Use Authorization (EUA).  This EUA will remain in effect (meaning this test can be used) for the duration of the COVID-19 declaration under Section 564(b)(1) of the Act, 21 U.S.C. section 360bbb-3(b)(1), unless the authorization is terminated or revoked sooner. Performed at [facility], [place] [address], [place], [place] [address]   Surgical pcr screen     Status: None   Collection Time: 10/05/19 10:07 AM   Specimen: Nasal Mucosa; Nasal Swab  Result Value Ref Range Status   MRSA, PCR NEGATIVE NEGATIVE Final   Staphylococcus aureus NEGATIVE NEGATIVE Final    Comment:  (NOTE) The Xpert SA Assay (FDA approved for NASAL specimens in patients 54 years of age and older), is one component of a comprehensive surveillance program. It is not intended to diagnose infection nor to guide or monitor treatment. Performed at [facility], [place] [address] [place], [place] [address]     Radiology Studies: DG Abd Portable 1V  Result Date: 10/05/2019 CLINICAL DATA:  Small-bowel obstruction.  Vomiting EXAM: PORTABLE ABDOMEN - 1 VIEW COMPARISON:  10/04/2019; 10/03/2019; CT abdomen pelvis-10/03/2019 FINDINGS: Enteric tube side port again projects over the distal esophagus. Moderate colonic stool burden without evidence of enteric obstruction. No supine evidence of pneumoperitoneum. No pneumatosis or portal venous gas. Embolization coil overlies the right lower pelvis. Vascular calcifications overlie the pelvis bilaterally. No definitive abnormal intra-abdominal calcifications. Limited visualization of lower thorax suggests a trace right-sided pleural effusion. No acute osseous abnormalities. IMPRESSION: 1. Enteric tube side port projects over the distal esophagus. Advancement at least 12 cm is advised. 2. Paucity of bowel gas without evidence of enteric obstruction. 3. Trace right-sided pleural effusion. Electronically Signed   By: Sandi Mariscal M.D.   On: 10/05/2019 07:57      Kyle Stansell T. Gordon  If 7PM-7AM, please contact night-coverage [email or web address] Password [email or web address] 10/05/2019, 1:02 PM

## 2019-10-05 NOTE — Progress Notes (Signed)
Subjective:  No cardiac symptoms Thirsty Frustrated and wants NG tube out Thinks he is still getting dehydrated   Objective:  Vitals:   10/04/19 1547 10/04/19 2014 10/05/19 0252 10/05/19 0810  BP: 98/67 104/65 101/66 117/73  Pulse: 100 98 92 92  Resp: _0 Temp: 100.1 F (37.8 C) 99.1 F (37.3 C) 98.4 F (36.9 C) 98.9 F (37.2 C)  TempSrc: Oral Oral Oral Oral  SpO2: 97% 97% 99%   Weight:      Height:        Intake/Output from previous day:  Intake/Output Summary (Last 24 hours) at 10/05/2019 1002 Last data filed at 10/05/2019 0600 Gross per 24 hour  Intake 0 ml  Output 2350 ml  Net -2350 ml    Physical Exam: Frustrated  Chronically ill black male  HEENT: normal Neck supple with no adenopathy JVP normal no bruits no thyromegaly Lungs clear with no wheezing and good diaphragmatic motion Heart:  S1/S2 no murmur, no rub, gallop or click PMI normal Abdomen: BS positive NG tube bilious material  no bruit.  No HSM or HJR Distal pulses intact with no bruits No edema Neuro non-focal Skin warm and dry No muscular weakness   Lab Results: Basic Metabolic Panel: Recent Labs    10/04/19 0633 10/05/19 0833  NA 135 141  K 3.9 3.6  CL 94* 108  CO2 23 21*  GLUCOSE 96 107*  BUN 106* 100*  CREATININE 7.58* 4.74*  CALCIUM 8.4* 8.2*  MG 2.3 2.4  PHOS 7.3* 6.2*   Liver Function Tests: Recent Labs    10/04/19 0633 10/05/19 0833  ALBUMIN 3.4* 2.7*   No results for input(s): LIPASE, AMYLASE in the last 72 hours. CBC: Recent Labs    10/04/19 0633 10/05/19 0833  WBC 9.5 7.0  HGB 10.8* 9.5*  HCT 34.4* 30.6*  MCV 97.2 98.7  PLT 179 166    Anemia Panel: Recent Labs    10/02/19 1209  VITAMINB12 277  FOLATE 16.1  FERRITIN 603*  TIBC 266  IRON 26*  RETICCTPCT 1.5    Imaging: CT ABDOMEN PELVIS WO CONTRAST  Result Date: 10/03/2019 CLINICAL DATA:  Nausea and vomiting. Small-bowel obstruction on CT 09/28/2019. Improvement in clinical symptoms  and symptoms return. EXAM: CT ABDOMEN AND PELVIS WITHOUT CONTRAST TECHNIQUE: Multidetector CT imaging of the abdomen and pelvis was performed following the standard protocol without IV contrast. COMPARISON:  CT 09/28/2019 FINDINGS: Lower chest: Moderate right pleural effusion is only partially included. Adjacent compressive atelectasis. Subpleural reticulation within both lung bases. There is enteric contrast in the distal esophagus which is dilated. Coronary artery calcifications. Hepatobiliary: No evidence of focal hepatic lesion on noncontrast exam. Branching calcifications in the left liver likely vascular. Calcified gallstones within decompressed gallbladder. Pancreas: No ductal dilatation or inflammation. Spleen: Normal in size without focal abnormality. Adrenals/Urinary Tract: No adrenal nodule. No hydronephrosis. No significant perinephric edema. No renal calculi. Urinary bladder is physiologically distended. Stomach/Bowel: Enteric tube in the stomach. The distal esophagus is dilated with enteric contrast. Mild gastric dilatation. Small bowel are diffusely dilated and fluid/contrast filled. Transition point in the right mid abdomen, series 3, image 56 with mild small bowel wall thickening, also seen on prior exam. No pneumatosis. The more distal small bowel are decompressed. Small to moderate volume of stool throughout the colon. No colonic wall thickening. Normal appendix tentatively visualized, series 3, image 66. Vascular/Lymphatic: Advanced aortic and branch atherosclerosis. No aortic aneurysm. No bulky abdominopelvic adenopathy. Reproductive: Prostate  is unremarkable. Other: Small amount of perihepatic free fluid.  No free air. Musculoskeletal: There are no acute or suspicious osseous abnormalities. IMPRESSION: 1. Persistent or recurrent small bowel obstruction with transition point in the right mid abdomen. There is small bowel wall thickening at the site of transition. 2. Small amount of perihepatic  free fluid. No free air. 3. Moderate right pleural effusion is only partially included. 4. Cholelithiasis. Aortic Atherosclerosis (ICD10-I70.0). Electronically Signed   By: Keith Rake M.D.   On: 10/03/2019 20:46   US RENAL  Result Date: 10/03/2019 CLINICAL DATA:  Acute kidney injury. EXAM: RENAL / URINARY TRACT ULTRASOUND COMPLETE COMPARISON:  Abdominal CT 09/28/2018 FINDINGS: Right Kidney: Renal measurements: AP 1 x 4.8 x 4.7 cm = volume: 96 mL. Diffusely increased renal echogenicity. Mild thinning of the renal parenchyma. No hydronephrosis. No evidence of focal lesion. Left Kidney: Renal measurements: 9.7 x 5.9 x 5.1 cm = volume: 152 mL. Slight increased renal echogenicity. No hydronephrosis. No focal lesion. Bladder: Appears normal for degree of bladder distention. Other: Ascites and right pleural effusion, also seen on recent CT. IMPRESSION: 1. Increased bilateral renal echogenicity consistent with chronic medical renal disease. Mild right renal atrophy. No obstructive uropathy. 2. Small volume ascites and right pleural effusion, as seen on recent CT. Electronically Signed   By: Keith Rake M.D.   On: 10/03/2019 17:03   DG Abd Portable 1V  Result Date: 10/05/2019 CLINICAL DATA:  Small-bowel obstruction.  Vomiting EXAM: PORTABLE ABDOMEN - 1 VIEW COMPARISON:  10/04/2019; 10/03/2019; CT abdomen pelvis-10/03/2019 FINDINGS: Enteric tube side port again projects over the distal esophagus. Moderate colonic stool burden without evidence of enteric obstruction. No supine evidence of pneumoperitoneum. No pneumatosis or portal venous gas. Embolization coil overlies the right lower pelvis. Vascular calcifications overlie the pelvis bilaterally. No definitive abnormal intra-abdominal calcifications. Limited visualization of lower thorax suggests a trace right-sided pleural effusion. No acute osseous abnormalities. IMPRESSION: 1. Enteric tube side port projects over the distal esophagus. Advancement at  least 12 cm is advised. 2. Paucity of bowel gas without evidence of enteric obstruction. 3. Trace right-sided pleural effusion. Electronically Signed   By: Sandi Mariscal M.D.   On: 10/05/2019 07:57   DG Abd Portable 1V  Result Date: 10/04/2019 CLINICAL DATA:  Nausea and vomiting. Evaluate for small bowel obstruction. EXAM: PORTABLE ABDOMEN - 1 VIEW COMPARISON:  10/03/2019 FINDINGS: Nasogastric tube has been pulled back and has tip over the stomach in the left upper quadrant and side-port over the distal esophagus above the gastroesophageal junction. Bowel gas pattern is nonobstructive. No free peritoneal air. Cardiomediastinal silhouette and remainder of the exam is unchanged. IMPRESSION: 1.  Nonobstructive bowel gas pattern. 2. Nasogastric tube has been pulled back slightly as tip is over the stomach in the left upper quadrant and side-port over the distal esophagus. This could be advanced 6-7 cm. Electronically Signed   By: Marin Olp M.D.   On: 10/04/2019 11:37   DG Abd Portable 1V  Result Date: 10/03/2019 CLINICAL DATA:  59 year old male enteric tube placement. EXAM: PORTABLE ABDOMEN - 1 VIEW COMPARISON:  10/02/2019 KUB and earlier. FINDINGS: Portable AP supine view at 1446 hours. Enteric tube terminates in the left upper quadrant, side hole at the level of the gastric cardia. Bowel-gas pattern has not significantly changed from the CT on 09/28/2019, and might indicate continued fluid-filled small bowel loops throughout the abdomen. Right pleural effusion is less apparent. Lung bases appear stable. No acute osseous abnormality identified. IMPRESSION: 1. Enteric  tube placed into the stomach, side hole at the level of the gastric cardia. 2. Stable bowel gas pattern since 09/28/2019, suspicious for ongoing fluid-filled small bowel. Electronically Signed   By: Genevie Ann M.D.   On: 10/03/2019 15:14     ECG: 09/26/19 SR rate 10 LAE old IMI no acute changes    Telemetry:  NsR 10/05/2019    Relevant CV  Studies:  Echo 05/12/19 EF 55%  Mildly reduced RV EF Small effusion Mild TR  Right and left Cath 07/01/18  Non obstructive CAD Patent RCA stent  Moderate to severe PAH Started on targeted PAH Rx  PA 81/26 mmHg PVR 6.0 WU PCWP 17   Medications:   . heparin  5,000 Units Subcutaneous Q8H  . sodium chloride flush  3 mL Intravenous Once     . [START ON 10/06/2019] cefoTEtan (CEFOTAN) IV    . dextrose 5 % and 0.9% NaCl 125 mL/hr at 10/05/19 0958    Assessment/Plan:  1. 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 CAD appears stable with patent RCA stent at cath 07/01/18 LV EF is normal at 55% No angina hemodynamics are fine He is not volume overloaded His meds for pulmonary HTN are being held as he is NPO. Home meds include revatio 40 mg tid and Opsumit 10 mg daily Sats are 92% on RA. Important to avoid hypoxia with known pulmonary hypertension Bigger issue is ATN and renal failure This will not likely get better if he needs anesthesia and open surgery This am Cr much better 4.7 KUB with no evidence of enteric obstruction Hopefully will continue to hydrate take NG tube out advance diet and avoid surgery Once taking PO resume revatio and Opsumit   Jenkins Rouge 10/05/2019, 10:02 AM

## 2019-10-05 NOTE — Progress Notes (Signed)
Subjective/Chief Complaint: Pt with no better abd fxn   Objective: Vital signs in last 24 hours: Temp:  [98.4 F (36.9 C)-100.1 F (37.8 C)] 98.4 F (36.9 C) (06/13 0252) Pulse Rate:  [92-100] 92 (06/13 0252) Resp:  [12-16] 14 (06/13 0252) BP: (98-115)/(65-72) 101/66 (06/13 0252) SpO2:  [92 %-99 %] 99 % (06/13 0252) Last BM Date: 10/04/19  Intake/Output from previous day: 06/12 0701 - 06/13 0700 In: 0  Out: 3150 [Urine:200; Emesis/NG output:2950] Intake/Output this shift: No intake/output data recorded.  PE:  Constitutional: No acute distress, conversant, appears states age. Eyes: Anicteric sclerae, moist conjunctiva, no lid lag Lungs: Clear to auscultation bilaterally, normal respiratory effort CV: regular rate and rhythm, no murmurs, no peripheral edema, pedal pulses 2+ GI: Soft, no masses or hepatosplenomegaly, non-tender to palpation, distended Skin: No rashes, palpation reveals normal turgor Psychiatric: appropriate judgment and insight, oriented to person, place, and time   Lab Results:  Recent Labs    10/04/19 0633  WBC 9.5  HGB 10.8*  HCT 34.4*  PLT 179   BMET Recent Labs    10/03/19 0451 10/04/19 0633  NA 136 135  K 3.3* 3.9  CL 93* 94*  CO2 27 23  GLUCOSE 103* 96  BUN 94* 106*  CREATININE 7.32* 7.58*  CALCIUM 9.0 8.4*   PT/INR No results for input(s): LABPROT, INR in the last 72 hours. ABG No results for input(s): PHART, HCO3 in the last 72 hours.  Invalid input(s): PCO2, PO2  Studies/Results: CT ABDOMEN PELVIS WO CONTRAST  Result Date: 10/03/2019 CLINICAL DATA:  Nausea and vomiting. Small-bowel obstruction on CT 09/28/2019. Improvement in clinical symptoms and symptoms return. EXAM: CT ABDOMEN AND PELVIS WITHOUT CONTRAST TECHNIQUE: Multidetector CT imaging of the abdomen and pelvis was performed following the standard protocol without IV contrast. COMPARISON:  CT 09/28/2019 FINDINGS: Lower chest: Moderate right pleural effusion is  only partially included. Adjacent compressive atelectasis. Subpleural reticulation within both lung bases. There is enteric contrast in the distal esophagus which is dilated. Coronary artery calcifications. Hepatobiliary: No evidence of focal hepatic lesion on noncontrast exam. Branching calcifications in the left liver likely vascular. Calcified gallstones within decompressed gallbladder. Pancreas: No ductal dilatation or inflammation. Spleen: Normal in size without focal abnormality. Adrenals/Urinary Tract: No adrenal nodule. No hydronephrosis. No significant perinephric edema. No renal calculi. Urinary bladder is physiologically distended. Stomach/Bowel: Enteric tube in the stomach. The distal esophagus is dilated with enteric contrast. Mild gastric dilatation. Small bowel are diffusely dilated and fluid/contrast filled. Transition point in the right mid abdomen, series 3, image 56 with mild small bowel wall thickening, also seen on prior exam. No pneumatosis. The more distal small bowel are decompressed. Small to moderate volume of stool throughout the colon. No colonic wall thickening. Normal appendix tentatively visualized, series 3, image 66. Vascular/Lymphatic: Advanced aortic and branch atherosclerosis. No aortic aneurysm. No bulky abdominopelvic adenopathy. Reproductive: Prostate is unremarkable. Other: Small amount of perihepatic free fluid.  No free air. Musculoskeletal: There are no acute or suspicious osseous abnormalities. IMPRESSION: 1. Persistent or recurrent small bowel obstruction with transition point in the right mid abdomen. There is small bowel wall thickening at the site of transition. 2. Small amount of perihepatic free fluid. No free air. 3. Moderate right pleural effusion is only partially included. 4. Cholelithiasis. Aortic Atherosclerosis (ICD10-I70.0). Electronically Signed   By: Keith Rake M.D.   On: 10/03/2019 20:46   US RENAL  Result Date: 10/03/2019 CLINICAL DATA:  Acute  kidney injury. EXAM: RENAL /  URINARY TRACT ULTRASOUND COMPLETE COMPARISON:  Abdominal CT 09/28/2018 FINDINGS: Right Kidney: Renal measurements: AP 1 x 4.8 x 4.7 cm = volume: 96 mL. Diffusely increased renal echogenicity. Mild thinning of the renal parenchyma. No hydronephrosis. No evidence of focal lesion. Left Kidney: Renal measurements: 9.7 x 5.9 x 5.1 cm = volume: 152 mL. Slight increased renal echogenicity. No hydronephrosis. No focal lesion. Bladder: Appears normal for degree of bladder distention. Other: Ascites and right pleural effusion, also seen on recent CT. IMPRESSION: 1. Increased bilateral renal echogenicity consistent with chronic medical renal disease. Mild right renal atrophy. No obstructive uropathy. 2. Small volume ascites and right pleural effusion, as seen on recent CT. Electronically Signed   By: Keith Rake M.D.   On: 10/03/2019 17:03   DG Abd Portable 1V  Result Date: 10/05/2019 CLINICAL DATA:  Small-bowel obstruction.  Vomiting EXAM: PORTABLE ABDOMEN - 1 VIEW COMPARISON:  10/04/2019; 10/03/2019; CT abdomen pelvis-10/03/2019 FINDINGS: Enteric tube side port again projects over the distal esophagus. Moderate colonic stool burden without evidence of enteric obstruction. No supine evidence of pneumoperitoneum. No pneumatosis or portal venous gas. Embolization coil overlies the right lower pelvis. Vascular calcifications overlie the pelvis bilaterally. No definitive abnormal intra-abdominal calcifications. Limited visualization of lower thorax suggests a trace right-sided pleural effusion. No acute osseous abnormalities. IMPRESSION: 1. Enteric tube side port projects over the distal esophagus. Advancement at least 12 cm is advised. 2. Paucity of bowel gas without evidence of enteric obstruction. 3. Trace right-sided pleural effusion. Electronically Signed   By: Sandi Mariscal M.D.   On: 10/05/2019 07:57   DG Abd Portable 1V  Result Date: 10/04/2019 CLINICAL DATA:  Nausea and vomiting.  Evaluate for small bowel obstruction. EXAM: PORTABLE ABDOMEN - 1 VIEW COMPARISON:  10/03/2019 FINDINGS: Nasogastric tube has been pulled back and has tip over the stomach in the left upper quadrant and side-port over the distal esophagus above the gastroesophageal junction. Bowel gas pattern is nonobstructive. No free peritoneal air. Cardiomediastinal silhouette and remainder of the exam is unchanged. IMPRESSION: 1.  Nonobstructive bowel gas pattern. 2. Nasogastric tube has been pulled back slightly as tip is over the stomach in the left upper quadrant and side-port over the distal esophagus. This could be advanced 6-7 cm. Electronically Signed   By: Marin Olp M.D.   On: 10/04/2019 11:37   DG Abd Portable 1V  Result Date: 10/03/2019 CLINICAL DATA:  59 year old male enteric tube placement. EXAM: PORTABLE ABDOMEN - 1 VIEW COMPARISON:  10/02/2019 KUB and earlier. FINDINGS: Portable AP supine view at 1446 hours. Enteric tube terminates in the left upper quadrant, side hole at the level of the gastric cardia. Bowel-gas pattern has not significantly changed from the CT on 09/28/2019, and might indicate continued fluid-filled small bowel loops throughout the abdomen. Right pleural effusion is less apparent. Lung bases appear stable. No acute osseous abnormality identified. IMPRESSION: 1. Enteric tube placed into the stomach, side hole at the level of the gastric cardia. 2. Stable bowel gas pattern since 09/28/2019, suspicious for ongoing fluid-filled small bowel. Electronically Signed   By: Genevie Ann M.D.   On: 10/03/2019 15:14    Assessment/Plan:  HTN HLD CAD  Hypothyroidism ARF- Cr7.32  Anion gap acidosis - Per [facility] -  SBO - Hx ex [facility] he [facility] 59[facility] -Plan Ex Lap on Monday by Dr. Kandyce Rud, Preop abx FEN -NPO, IVF, NGT.  VTE -SCDs, heparin subq ID -none.   LOS:  7 days    Ralene Ok 10/05/2019

## 2019-10-06 ENCOUNTER — Inpatient Hospital Stay (HOSPITAL_COMMUNITY): Payer: 59 | Admitting: Certified Registered"

## 2019-10-06 ENCOUNTER — Encounter (HOSPITAL_COMMUNITY): Payer: Self-pay | Admitting: Internal Medicine

## 2019-10-06 ENCOUNTER — Encounter (HOSPITAL_COMMUNITY)
Admission: EM | Disposition: A | Discharge status: Home / Self Care | Payer: Self-pay | Source: Home / Self Care | Attending: Student

## 2019-10-06 HISTORY — PX: LYSIS OF ADHESION: SHX5961

## 2019-10-06 HISTORY — PX: LAPAROTOMY: SHX154

## 2019-10-06 LAB — RENAL FUNCTION PANEL
Albumin: 2.5 g/dL — ABNORMAL LOW (ref 3.5–5.0)
Anion gap: 11 (ref 5–15)
BUN: 81 mg/dL — ABNORMAL HIGH (ref 6–20)
CO2: 23 mmol/L (ref 22–32)
Calcium: 8.2 mg/dL — ABNORMAL LOW (ref 8.9–10.3)
Chloride: 112 mmol/L — ABNORMAL HIGH (ref 98–111)
Creatinine, Ser: 3.43 mg/dL — ABNORMAL HIGH (ref 0.61–1.24)
GFR calc Af Amer: 21 mL/min — ABNORMAL LOW (ref >60)
GFR calc non Af Amer: 18 mL/min — ABNORMAL LOW (ref >60)
Glucose, Bld: 113 mg/dL — ABNORMAL HIGH (ref 70–99)
Phosphorus: 4.9 mg/dL — ABNORMAL HIGH (ref 2.5–4.6)
Potassium: 3.5 mmol/L (ref 3.5–5.1)
Sodium: 146 mmol/L — ABNORMAL HIGH (ref 135–145)

## 2019-10-06 LAB — CBC
HCT: 31.9 % — ABNORMAL LOW (ref 39.0–52.0)
Hemoglobin: 9.9 g/dL — ABNORMAL LOW (ref 13.0–17.0)
MCH: 30.7 pg (ref 26.0–34.0)
MCHC: 31 g/dL (ref 30.0–36.0)
MCV: 98.8 fL (ref 80.0–100.0)
Platelets: 203 10*3/uL (ref 150–400)
RBC: 3.23 MIL/uL — ABNORMAL LOW (ref 4.22–5.81)
RDW: 15.3 % (ref 11.5–15.5)
WBC: 7.5 10*3/uL (ref 4.0–10.5)
nRBC: 0 % (ref 0.0–0.2)

## 2019-10-06 LAB — TYPE AND SCREEN
ABO/RH(D): A POS
Antibody Screen: NEGATIVE

## 2019-10-06 LAB — MAGNESIUM: Magnesium: 2.3 mg/dL (ref 1.7–2.4)

## 2019-10-06 SURGERY — LAPAROTOMY, EXPLORATORY
Anesthesia: General | Site: Abdomen

## 2019-10-06 MED ORDER — FENTANYL CITRATE (PF) 250 MCG/5ML IJ SOLN
INTRAMUSCULAR | Status: DC | PRN
  Administered 2019-10-06 (×5): 50 ug via INTRAVENOUS

## 2019-10-06 MED ORDER — 0.9 % SODIUM CHLORIDE (POUR BTL) OPTIME
TOPICAL | Status: DC | PRN
  Administered 2019-10-06 (×2): 1000 mL

## 2019-10-06 MED ORDER — PHENYLEPHRINE HCL-NACL 10-0.9 MG/250ML-% IV SOLN
INTRAVENOUS | Status: DC | PRN
  Administered 2019-10-06: 40 ug/min via INTRAVENOUS

## 2019-10-06 MED ORDER — ROCURONIUM BROMIDE 10 MG/ML (PF) SYRINGE
PREFILLED_SYRINGE | INTRAVENOUS | Status: DC | PRN
  Administered 2019-10-06: 20 mg via INTRAVENOUS
  Administered 2019-10-06: 40 mg via INTRAVENOUS

## 2019-10-06 MED ORDER — PHENYLEPHRINE HCL (PRESSORS) 10 MG/ML IV SOLN
INTRAVENOUS | Status: DC | PRN
  Administered 2019-10-06: 80 ug via INTRAVENOUS

## 2019-10-06 MED ORDER — DEXAMETHASONE SODIUM PHOSPHATE 10 MG/ML IJ SOLN
INTRAMUSCULAR | Status: DC | PRN
  Administered 2019-10-06: 4 mg via INTRAVENOUS

## 2019-10-06 MED ORDER — LIDOCAINE 2% (20 MG/ML) 5 ML SYRINGE
INTRAMUSCULAR | Status: DC | PRN
  Administered 2019-10-06: 60 mg via INTRAVENOUS

## 2019-10-06 MED ORDER — SODIUM CHLORIDE 0.9 % IV SOLN: INTRAVENOUS | Status: DC | PRN

## 2019-10-06 MED ORDER — HYDROMORPHONE HCL 1 MG/ML IJ SOLN
0.5000 mg | INTRAMUSCULAR | Status: DC | PRN
  Administered 2019-10-07: 1 mg via INTRAVENOUS
  Filled 2019-10-06: qty 1

## 2019-10-06 MED ORDER — SODIUM CHLORIDE 0.9 % IV SOLN: INTRAVENOUS | Status: DC

## 2019-10-06 MED ORDER — MIDAZOLAM HCL 2 MG/2ML IJ SOLN
INTRAMUSCULAR | Status: AC
  Filled 2019-10-06: qty 2

## 2019-10-06 MED ORDER — CHLORHEXIDINE GLUCONATE 0.12 % MT SOLN
15.0000 mL | Freq: Once | OROMUCOSAL | Status: AC
  Administered 2019-10-06: 15 mL via OROMUCOSAL
  Filled 2019-10-06: qty 15

## 2019-10-06 MED ORDER — CEFAZOLIN SODIUM-DEXTROSE 2-4 GM/100ML-% IV SOLN
2.0000 g | Freq: Once | INTRAVENOUS | Status: AC
  Administered 2019-10-06: 2 g via INTRAVENOUS
  Filled 2019-10-06: qty 100

## 2019-10-06 MED ORDER — PROPOFOL 10 MG/ML IV BOLUS
INTRAVENOUS | Status: DC | PRN
  Administered 2019-10-06: 100 mg via INTRAVENOUS

## 2019-10-06 MED ORDER — STERILE WATER FOR IRRIGATION IR SOLN
Status: DC | PRN
  Administered 2019-10-06: 1000 mL

## 2019-10-06 MED ORDER — PROPOFOL 10 MG/ML IV BOLUS
INTRAVENOUS | Status: AC
  Filled 2019-10-06: qty 20

## 2019-10-06 MED ORDER — ONDANSETRON HCL 4 MG/2ML IJ SOLN
INTRAMUSCULAR | Status: DC | PRN
  Administered 2019-10-06: 4 mg via INTRAVENOUS

## 2019-10-06 MED ORDER — SUCCINYLCHOLINE CHLORIDE 20 MG/ML IJ SOLN
INTRAMUSCULAR | Status: DC | PRN
  Administered 2019-10-06: 120 mg via INTRAVENOUS

## 2019-10-06 MED ORDER — METHOCARBAMOL 1000 MG/10ML IJ SOLN
500.0000 mg | Freq: Three times a day (TID) | INTRAVENOUS | Status: DC
  Administered 2019-10-06 – 2019-10-10 (×12): 500 mg via INTRAVENOUS
  Filled 2019-10-06 (×8): qty 5
  Filled 2019-10-06: qty 500
  Filled 2019-10-06 (×5): qty 5

## 2019-10-06 MED ORDER — ACETAMINOPHEN 10 MG/ML IV SOLN
INTRAVENOUS | Status: DC | PRN
Start: 2019-10-06 — End: 2019-10-06
  Administered 2019-10-06: 1000 mg via INTRAVENOUS

## 2019-10-06 MED ORDER — CHLORHEXIDINE GLUCONATE CLOTH 2 % EX PADS
6.0000 | MEDICATED_PAD | Freq: Every day | CUTANEOUS | Status: DC
  Administered 2019-10-06 – 2019-10-12 (×7): 6 via TOPICAL

## 2019-10-06 MED ORDER — MIDAZOLAM HCL 5 MG/5ML IJ SOLN
INTRAMUSCULAR | Status: DC | PRN
  Administered 2019-10-06: 2 mg via INTRAVENOUS

## 2019-10-06 MED ORDER — ACETAMINOPHEN 10 MG/ML IV SOLN
INTRAVENOUS | Status: AC
  Filled 2019-10-06: qty 100

## 2019-10-06 MED ORDER — FENTANYL CITRATE (PF) 250 MCG/5ML IJ SOLN
INTRAMUSCULAR | Status: AC
  Filled 2019-10-06: qty 5

## 2019-10-06 MED ORDER — ACETAMINOPHEN 10 MG/ML IV SOLN
1000.0000 mg | Freq: Four times a day (QID) | INTRAVENOUS | Status: AC
  Administered 2019-10-06 – 2019-10-07 (×4): 1000 mg via INTRAVENOUS
  Filled 2019-10-06 (×4): qty 100

## 2019-10-06 MED ORDER — SUGAMMADEX SODIUM 200 MG/2ML IV SOLN
INTRAVENOUS | Status: DC | PRN
  Administered 2019-10-06: 100 mg via INTRAVENOUS

## 2019-10-06 MED ORDER — DEXTROSE-NACL 5-0.45 % IV SOLN: INTRAVENOUS | Status: DC

## 2019-10-06 SURGICAL SUPPLY — 36 items
APL PRP STRL LF DISP 70% ISPRP (MISCELLANEOUS) ×1
BRR ADH 5X3 SEPRAFILM 6 SHT (MISCELLANEOUS) ×1
CANISTER SUCT 3000ML PPV (MISCELLANEOUS) ×3 IMPLANT
CHLORAPREP W/TINT 26 (MISCELLANEOUS) ×3 IMPLANT
COVER SURGICAL LIGHT HANDLE (MISCELLANEOUS) ×3 IMPLANT
DRAPE LAPAROSCOPIC ABDOMINAL (DRAPES) ×3 IMPLANT
DRAPE WARM FLUID 44X44 (DRAPES) ×3 IMPLANT
DRSG OPSITE POSTOP 4X10 (GAUZE/BANDAGES/DRESSINGS) ×2 IMPLANT
ELECT CAUTERY BLADE 6.4 (BLADE) ×2 IMPLANT
ELECT REM PT RETURN 9FT ADLT (ELECTROSURGICAL) ×3
ELECTRODE REM PT RTRN 9FT ADLT (ELECTROSURGICAL) ×1 IMPLANT
GLOVE BIO SURGEON STRL SZ7 (GLOVE) ×3 IMPLANT
GLOVE BIOGEL PI IND STRL 7.5 (GLOVE) ×1 IMPLANT
GLOVE BIOGEL PI INDICATOR 7.5 (GLOVE) ×2
GOWN STRL REUS W/ TWL LRG LVL3 (GOWN DISPOSABLE) ×2 IMPLANT
GOWN STRL REUS W/TWL LRG LVL3 (GOWN DISPOSABLE) ×9
HANDLE SUCTION POOLE (INSTRUMENTS) ×1 IMPLANT
KIT BASIN OR (CUSTOM PROCEDURE TRAY) ×3 IMPLANT
KIT TURNOVER KIT B (KITS) ×3 IMPLANT
NS IRRIG 1000ML POUR BTL (IV SOLUTION) ×6 IMPLANT
PACK GENERAL/GYN (CUSTOM PROCEDURE TRAY) ×3 IMPLANT
PAD ARMBOARD 7.5X6 YLW CONV (MISCELLANEOUS) ×3 IMPLANT
PENCIL SMOKE EVACUATOR (MISCELLANEOUS) ×3 IMPLANT
SEPRAFILM PROCEDURAL PACK 3X5 (MISCELLANEOUS) ×2 IMPLANT
STAPLER VISISTAT 35W (STAPLE) ×3 IMPLANT
SUCTION POOLE HANDLE (INSTRUMENTS) ×3
SUT NOVA 1 T20/GS 25DT (SUTURE) ×4 IMPLANT
SUT PDS AB 1 TP1 96 (SUTURE) ×6 IMPLANT
SUT SILK 2 0 (SUTURE) ×3
SUT SILK 2 0 SH CR/8 (SUTURE) ×3 IMPLANT
SUT SILK 2-0 18XBRD TIE 12 (SUTURE) ×1 IMPLANT
SUT SILK 3 0 (SUTURE) ×3
SUT SILK 3 0 SH CR/8 (SUTURE) ×3 IMPLANT
SUT SILK 3-0 18XBRD TIE 12 (SUTURE) ×1 IMPLANT
TOWEL GREEN STERILE (TOWEL DISPOSABLE) ×3 IMPLANT
TRAY FOLEY MTR SLVR 16FR STAT (SET/KITS/TRAYS/PACK) ×3 IMPLANT

## 2019-10-06 NOTE — Progress Notes (Signed)
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 Adin Hector.  Baseline creatinine 2.5 mg/dL.  Renal ultrasound revealed no evidence of obstruction.  Maintenance IV fluids have been given 1.5 L normal saline bolus administered as patient appears to be dry and cachectic.  Blood pressure 120/77 pulse 100 temperature 100.4 10 O2 sats 91% room air  Sodium 146 potassium 3.5 chloride 112 CO2 23 BUN 81 creatinine 3.43 glucose 113 calcium 8.2 phosphorus 4.9 magnesium 2.3 hemoglobin 9.9.  IV D5 normal saline 125 cc an hour   Objective:  Vital signs in last 24 hours:  Temp:  [99.7 F (37.6 C)-100.4 F (38 C)] 100.4 F (38 C) (06/14 0500) Pulse Rate:  [96-100] 100 (06/14 0500) Resp:  [14-16] 16 (06/14 0500) BP: (103-132)/(76-78) 128/77 (06/14 0500) SpO2:  [91 %] 91 % (06/14 0500) Weight:  [50 kg] 50 kg (06/14 0833)  Weight change:  Filed Weights   10/03/19 0434 10/06/19 0512 10/06/19 0833  Weight: 54.7 kg 50 kg 50 kg    Intake/Output: I/O last 3 completed shifts: In: 0  Out: 5275 [Urine:1325; Emesis/NG output:3950]   Intake/Output this shift:  No intake/output data recorded.  General: Cachectic older looking male lying on bed with NG tube in place Heart:RRR, s1s2 nl, no rubs Lungs: Clear b/l, no crackle Abdomen:soft, Non-tender, non-distended Extremities:No LE edema Neurology: Alert, awake and following commands.  No asterixis   Basic Metabolic Panel: Recent Labs  Lab 10/02/19 0403 10/02/19 0403 10/03/19 0451 10/03/19 0451 10/04/19 4782 10/05/19 0833 10/06/19 0237  NA 143  --  136  --  135 141 146*  K 3.5  --  3.3*  --  3.9 3.6 3.5  CL 104  --  93*  --  94* 108 112*  CO2 22  --  27  --  23 21* 23  GLUCOSE 99   --  103*  --  96 107* 113*  BUN 82*  --  94*  --  106* 100* 81*  CREATININE 4.19*  --  7.32*  --  7.58* 4.74* 3.43*  CALCIUM 8.8*   < > 9.0   < > 8.4* 8.2* 8.2*  MG 2.7*  --  2.8*  --  2.3 2.4 2.3  PHOS 4.8*  --  7.0*  --  7.3* 6.2* 4.9*   < > = values in this interval not displayed.    Liver Function Tests: Recent Labs  Lab 09/30/19 0358 09/30/19 0358 10/02/19 0403 10/03/19 0451 10/04/19 9562 10/05/19 0833 10/06/19 0237  AST 15  --   --   --   --   --   --   ALT 12  --   --   --   --   --   --   ALKPHOS 67  --   --   --   --   --   --   BILITOT 0.4  --   --   --   --   --   --   PROT 7.0  --   --   --   --   --   --   ALBUMIN 3.9   < > 3.7 4.1 3.4* 2.7* 2.5*   < > = values in this interval not displayed.   No results for input(s):  LIPASE, AMYLASE in the last 168 hours. No results for input(s): AMMONIA in the last 168 hours.  CBC: Recent Labs  Lab 10/01/19 0358 10/02/19 0403 10/04/19 5825 10/05/19 0833 10/06/19 0237  WBC 5.0 5.4 9.5 7.0 7.5  HGB 9.5* 10.0* 10.8* 9.5* 9.9*  HCT 31.5* 32.9* 34.4* 30.6* 31.9*  MCV 102.6* 99.4 97.2 98.7 98.8  PLT 176 206 179 166 203    Cardiac Enzymes: No results for input(s): CKTOTAL, CKMB, CKMBINDEX, TROPONINI in the last 168 hours.  BNP: Invalid input(s): POCBNP  CBG: No results for input(s): GLUCAP in the last 168 hours.  Microbiology: Results for orders placed or performed during the hospital encounter of 09/28/19  SARS Coronavirus 2 by RT PCR (hospital order, performed in [facility] hospital lab) Nasopharyngeal Nasopharyngeal Swab     Status: None   Collection Time: 09/28/19  4:59 PM   Specimen: Nasopharyngeal Swab  Result Value Ref Range Status   SARS Coronavirus 2 NEGATIVE NEGATIVE Final    Comment: (NOTE) SARS-CoV-2 target nucleic acids are NOT DETECTED. The SARS-CoV-2 RNA is generally detectable in upper and lower respiratory specimens during the acute phase of infection. The lowest concentration of SARS-CoV-2  viral copies this assay can detect is 250 copies / mL. A negative result does not preclude SARS-CoV-2 infection and should not be used as the sole basis for treatment or other patient management decisions.  A negative result may occur with improper specimen collection / handling, submission of specimen other than nasopharyngeal swab, presence of viral mutation(s) within the areas targeted by this assay, and inadequate number of viral copies (<250 copies / mL). A negative result must be combined with clinical observations, patient history, and epidemiological information. Fact Sheet for Patients:   StrictlyIdeas.no Fact Sheet for Healthcare Providers: BankingDealers.co.za This test is not yet approved or cleared  by the [place] FDA and has been authorized for detection and/or diagnosis of SARS-CoV-2 by FDA under an Emergency Use Authorization (EUA).  This EUA will remain in effect (meaning this test can be used) for the duration of the COVID-19 declaration under Section 564(b)(1) of the Act, 21 U.S.C. section 360bbb-3(b)(1), unless the authorization is terminated or revoked sooner. Performed at [facility], [place] [address], [place], [place] [address]   Surgical pcr screen     Status: None   Collection Time: 10/05/19 10:07 AM   Specimen: Nasal Mucosa; Nasal Swab  Result Value Ref Range Status   MRSA, PCR NEGATIVE NEGATIVE Final   Staphylococcus aureus NEGATIVE NEGATIVE Final    Comment: (NOTE) The Xpert SA Assay (FDA approved for NASAL specimens in patients 19 years of age and older), is one component of a comprehensive surveillance program. It is not intended to diagnose infection nor to guide or monitor treatment. Performed at [facility], [place] [address] [place], [place] [address]     Coagulation Studies: No results for input(s): LABPROT, INR in the last 72 hours.  Urinalysis: Recent Labs    10/03/19 2300   COLORURINE YELLOW  LABSPEC 1.016  PHURINE 5.0  GLUCOSEU NEGATIVE  HGBUR NEGATIVE  BILIRUBINUR SMALL*  KETONESUR NEGATIVE  PROTEINUR 100*  NITRITE NEGATIVE  LEUKOCYTESUR NEGATIVE      Imaging: DG Abd Portable 1V  Result Date: 10/05/2019 CLINICAL DATA:  Small-bowel obstruction.  Vomiting EXAM: PORTABLE ABDOMEN - 1 VIEW COMPARISON:  10/04/2019; 10/03/2019; CT abdomen pelvis-10/03/2019 FINDINGS: Enteric tube side port again projects over the distal esophagus. Moderate colonic stool burden without evidence of enteric  obstruction. No supine evidence of pneumoperitoneum. No pneumatosis or portal venous gas. Embolization coil overlies the right lower pelvis. Vascular calcifications overlie the pelvis bilaterally. No definitive abnormal intra-abdominal calcifications. Limited visualization of lower thorax suggests a trace right-sided pleural effusion. No acute osseous abnormalities. IMPRESSION: 1. Enteric tube side port projects over the distal esophagus. Advancement at least 12 cm is advised. 2. Paucity of bowel gas without evidence of enteric obstruction. 3. Trace right-sided pleural effusion. Electronically Signed   By: Sandi Mariscal M.D.   On: 10/05/2019 07:57     Medications:   . sodium chloride 10 mL/hr at 10/06/19 0850  . [MAR Hold] cefoTEtan (CEFOTAN) IV    . dextrose 5 % and 0.45% NaCl     . [MAR Hold] heparin  5,000 Units Subcutaneous Q8H  . [MAR Hold] sodium chloride flush  3 mL Intravenous Once   [MAR Hold] acetaminophen, [MAR Hold] acetaminophen, [MAR Hold] bisacodyl, [MAR Hold] labetalol, [MAR Hold] ondansetron **OR** [MAR Hold] ondansetron (ZOFRAN) IV, [MAR Hold] phenol, [MAR Hold] prochlorperazine  Assessment/ Plan:   Acute kidney injury setting of baseline chronic kidney disease stage IV.  Baseline creatinine appears about 2.5.  He has membranous nephropathy per biopsy.  Renal ultrasound does not reveal any evidence of hydronephrosis.  Patient appeared to be cachectic and dry  on admission.  Received IV fluids.  He continues to receive 125 cc/h.  He continues to make urine with 950 cc out 09/25/2019.  NG tube with 3 L output  Small bowel obstruction conservative management initially now for exploratory laparotomy appreciate the assistance of Dr. Serita Grammes.  Hypertension/volume continue to follow blood pressures acceptable.  Continue IV fluids at 125 cc an hour D5 normal saline.  Anemia received IV iron and Aranesp.  Hypernatremia mild we will continue to follow  Hypokalemia appears to resolved.   LOS: [place] _0 _1 :13 AM

## 2019-10-06 NOTE — Anesthesia Postprocedure Evaluation (Signed)
Anesthesia Post Note  Patient: DUNBAR BURAS  Procedure(s) Performed: EXPLORATORY LAPAROTOMY (N/A Abdomen) LYSIS OF ADHESION (N/A Abdomen)     Patient location during evaluation: PACU Anesthesia Type: General Level of consciousness: awake and alert Pain management: pain level controlled Vital Signs Assessment: post-procedure vital signs reviewed and stable Respiratory status: spontaneous breathing, nonlabored ventilation, respiratory function stable and patient connected to nasal cannula oxygen Cardiovascular status: blood pressure returned to baseline and stable Postop Assessment: no apparent nausea or vomiting Anesthetic complications: no   No complications documented.  Last Vitals:  Vitals:   10/06/19 1129 10/06/19 1306  BP: (!) 145/79 127/77  Pulse: 99 98  Resp: 16 17  Temp: 36.7 C (!) 36.3 C  SpO2: 95% 98%    Last Pain:  Vitals:   10/06/19 1306  TempSrc: Oral  PainSc:                  Jenet Durio
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
## 2019-10-06 NOTE — Transfer of Care (Signed)
Immediate Anesthesia Transfer of Care Note  Patient: [name]  Procedure(s) Performed: EXPLORATORY LAPAROTOMY (N/A Abdomen) LYSIS OF ADHESION (N/A Abdomen)  Patient Location: PACU  Anesthesia Type:General  Level of Consciousness: awake, alert  and oriented  Airway & Oxygen Therapy: Patient Spontanous Breathing and Patient connected to nasal cannula oxygen  Post-op Assessment: Report given to RN, Post -op Vital signs reviewed and stable and Patient moving all extremities  Post vital signs: Reviewed and stable  Last Vitals:  Vitals Value Taken Time  BP 142/86 10/06/19 1057  Temp    Pulse    Resp 18 10/06/19 1102  SpO2    Vitals shown include unvalidated device data.  Last Pain:  Vitals:   10/06/19 0755  TempSrc:   PainSc: 0-No pain         Complications: No complications documented.

## 2019-10-06 NOTE — Anesthesia Preprocedure Evaluation (Addendum)
Anesthesia Evaluation  Patient identified by MRN, date of birth, ID band Patient awake    Reviewed: Allergy & Precautions, H&P , NPO status , Patient's Chart, lab work & pertinent test results  Airway Mallampati: II  TM Distance: >3 FB Neck ROM: Full    Dental no notable dental hx. (+) Edentulous Upper, Partial Lower, Dental Advisory Given, Loose,    Pulmonary asthma , Current Smoker and Patient abstained from smoking., former smoker,    Pulmonary exam normal breath sounds clear to auscultation       Cardiovascular Exercise Tolerance: Good hypertension, + CAD and +CHF   Rhythm:Regular Rate:Normal     Neuro/Psych Anxiety negative neurological ROS     GI/Hepatic negative GI ROS, Neg liver ROS,   Endo/Other  Hypothyroidism   Renal/GU CRFRenal disease  negative genitourinary   Musculoskeletal   Abdominal   Peds  Hematology  (+) Blood dyscrasia, anemia ,   Anesthesia Other Findings   Reproductive/Obstetrics negative OB ROS                           Anesthesia Physical  Anesthesia Plan  ASA: III and emergent  Anesthesia Plan: General   Post-op Pain Management:    Induction: Intravenous and Cricoid pressure planned  PONV Risk Score and Plan: 2 and Propofol infusion, Midazolam, Ondansetron and Dexamethasone  Airway Management Planned: Oral ETT  Additional Equipment:   Intra-op Plan:   Post-operative Plan: Extubation in OR  Informed Consent: I have reviewed the patients History and Physical, chart, labs and discussed the procedure including the risks, benefits and alternatives for the proposed anesthesia with the patient or authorized representative who has indicated his/her understanding and acceptance.     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 [facility] 06/30/17 showed mod to severe PAH. Started on sildenafil and opsumit.    TTE 02/03/19: 1. Left ventricular ejection fraction, by visual estimation, is 55 to 60%. The left ventricle has normal function. Normal left ventricular size. Left ventricular septal wall thickness was mildly increased. Mildly increased left ventricular posterior  wall thickness. There is mildly increased left ventricular hypertrophy.  2. Elevated left ventricular end-diastolic pressure.  3. Left ventricular diastolic Doppler parameters are consistent with pseudonormalization pattern of LV diastolic filling.  4. Global right ventricle mild-moderately reduced.The right ventricular size is normal. No increase in right ventricular wall thickness.  5. Left atrial size was moderately dilated.  6. Right atrial size was normal.  7. Large pericardial effusion.  8. The pericardial effusion is circumferential.  9. There is inversion of the right ventricular wall. 10. Large circumferential pericarcial effusion measuring up to 2.71 cm. Comapred with the echo 05/2018, pericardial effusion is unchanged. 11. The mitral valve is normal in structure. Trace mitral valve regurgitation. No evidence of mitral stenosis. 12. The tricuspid valve is normal in structure. Tricuspid valve regurgitation is mild. 13. The aortic valve is tricuspid Aortic valve regurgitation was not visualized by color flow Doppler. Structurally normal aortic valve, with no evidence of sclerosis or stenosis. 14. The pulmonic valve was normal in structure. Pulmonic valve regurgitation is not visualized by color flow Doppler. 15. Moderately elevated pulmonary artery systolic pressure. 16. The inferior vena cava is normal in size with <  50% respiratory variability, suggesting right atrial pressure of 8 mmHg.      )       Anesthesia Quick Evaluation

## 2019-10-06 NOTE — Plan of Care (Signed)

## 2019-10-06 NOTE — Progress Notes (Signed)
Subjective/Chief Complaint: No flatus or bm, unchanged   Objective: Vital signs in last 24 hours: Temp:  [98.9 F (37.2 C)-100.4 F (38 C)] 100.4 F (38 C) (06/14 0500) Pulse Rate:  [92-100] 100 (06/14 0500) Resp:  [14-16] 16 (06/14 0500) BP: (103-132)/(73-78) 128/77 (06/14 0500) SpO2:  [91 %] 91 % (06/14 0500) Weight:  [50 kg] 50 kg (06/14 0512) Last BM Date: 10/04/19  Intake/Output from previous day: 06/13 0701 - 06/14 0700 In: 0  Out: 4025 [Urine:1325; Emesis/NG output:2700] Intake/Output this shift: No intake/output data recorded. Midline scar, distended , no bs, nontender  Lab Results:  Recent Labs    10/05/19 0833 10/06/19 0237  WBC 7.0 7.5  HGB 9.5* 9.9*  HCT 30.6* 31.9*  PLT 166 203   BMET Recent Labs    10/05/19 0833 10/06/19 0237  NA 141 146*  K 3.6 3.5  CL 108 112*  CO2 21* 23  GLUCOSE 107* 113*  BUN 100* 81*  CREATININE 4.74* 3.43*  CALCIUM 8.2* 8.2*   PT/INR No results for input(s): LABPROT, INR in the last 72 hours. ABG No results for input(s): PHART, HCO3 in the last 72 hours.  Invalid input(s): PCO2, PO2  Studies/Results: DG Abd Portable 1V  Result Date: 10/05/2019 CLINICAL DATA:  Small-bowel obstruction.  Vomiting EXAM: PORTABLE ABDOMEN - 1 VIEW COMPARISON:  10/04/2019; 10/03/2019; CT abdomen pelvis-10/03/2019 FINDINGS: Enteric tube side port again projects over the distal esophagus. Moderate colonic stool burden without evidence of enteric obstruction. No supine evidence of pneumoperitoneum. No pneumatosis or portal venous gas. Embolization coil overlies the right lower pelvis. Vascular calcifications overlie the pelvis bilaterally. No definitive abnormal intra-abdominal calcifications. Limited visualization of lower thorax suggests a trace right-sided pleural effusion. No acute osseous abnormalities. IMPRESSION: 1. Enteric tube side port projects over the distal esophagus. Advancement at least 12 cm is advised. 2. Paucity of bowel  gas without evidence of enteric obstruction. 3. Trace right-sided pleural effusion. Electronically Signed   By: Sandi Mariscal M.D.   On: 10/05/2019 07:57   DG Abd Portable 1V  Result Date: 10/04/2019 CLINICAL DATA:  Nausea and vomiting. Evaluate for small bowel obstruction. EXAM: PORTABLE ABDOMEN - 1 VIEW COMPARISON:  10/03/2019 FINDINGS: Nasogastric tube has been pulled back and has tip over the stomach in the left upper quadrant and side-port over the distal esophagus above the gastroesophageal junction. Bowel gas pattern is nonobstructive. No free peritoneal air. Cardiomediastinal silhouette and remainder of the exam is unchanged. IMPRESSION: 1.  Nonobstructive bowel gas pattern. 2. Nasogastric tube has been pulled back slightly as tip is over the stomach in the left upper quadrant and side-port over the distal esophagus. This could be advanced 6-7 cm. Electronically Signed   By: Marin Olp M.D.   On: 10/04/2019 11:37    Anti-infectives: Anti-infectives (From admission, onward)   Start     Dose/Rate Route Frequency Ordered Stop   10/06/19 0745  ceFAZolin (ANCEF) IVPB 2g/100 mL premix     Discontinue     2 g 200 mL/hr over 30 Minutes Intravenous  Once 10/06/19 0735     10/06/19 0600  cefoTEtan (CEFOTAN) 2 g in sodium chloride 0.9 % 100 mL IVPB     Discontinue     2 g 200 mL/hr over 30 Minutes Intravenous On call to O.R. 10/05/19 0810 10/07/19 0559      Assessment/Plan: sbo likely adhesive -he has failed nonoperative mgt. I discussed elap today with loa and possible sbr.  Discussed postop course and  risks with him as well. -will need to start tpn also  Rolm Bookbinder 10/06/2019

## 2019-10-06 NOTE — Progress Notes (Signed)
PT Cancellation Note  Patient Details Name: [name] MRN: [id number] DOB: [birth year]   Cancelled Treatment:    Reason Eval/Treat Not Completed: (P) Medical issues which prohibited therapy;Patient at procedure or test/unavailable (Pt off unit for ex/lap.  will defer tx at this time.)   Elizabth Palka J Stann Mainland 10/06/2019, 9:57 AM  Erasmo Leventhal , PTA Acute Rehabilitation Services Pager [phone] Office [phone]

## 2019-10-06 NOTE — Progress Notes (Signed)
PROGRESS NOTE  [name] OBS:[id number] DOB: [birth year]   PCP: [name], MD  Patient is from: Home.  DOA: 09/28/2019 LOS: 8  Brief Narrative / Interim history: 59 year old male with history of systolic CHF, scleroderma, HTN, CAD/PCI, HLD and malnutrition presenting with 2 days of nausea and vomiting and found to have small bowel obstruction.  He was managed conservatively with NG tube bowel decompression and IV fluid with improvement in symptoms.  NG tube discontinued on 10/01/2019.  He was cleared for discharge by general surgery from surgical standpoint. However, patient has significant emesis after NG tube removal.  General surgery recommended reinserting NGT.  Initially, patient refused NG tube replacement but he continued to have nausea and vomiting without bowel movement.  KUB without signs of SBO.  Attempted water enema without significant results continue to have intractable nausea and vomiting.  General surgery reconsulted on 6/11.  NG tube replaced.  CT abdomen and pelvis ordered.  Lumbar GI consulted as well for possible ileus/constipation.  Repeat CT abdomen and pelvis with persistent or recurrent SBO.  Cardiology consulted for preop eval in case he needs surgery.  Patient underwent ex lap with lysis of the adhesion by Dr. Donne Hazel on 10/06/2019.  Patient also developed worsening AKI with significant azotemia. Nephrology consulted.  Renal ultrasound without acute finding.  Received IV fluid boluses with increased maintenance rate.  Renal function improved.    Subjective: Seen and examined this morning after he returned from surgery.  Resting comfortably.  No complaints.  He denies nausea, vomiting, abdominal pain, chest pain or shortness of breath.  Had about 2.7 L output from NGT.  Had about 1.3 L urine output/24 hours.  Objective: Vitals:   10/06/19 1100 10/06/19 1115 10/06/19 1129 10/06/19 1306  BP: (!) 142/86 (!) 149/78 (!) 145/79 127/77  Pulse: 89 90 99 98    Resp: _0 Temp: 97.9 F (36.6 C)  98 F (36.7 C) (!) 97.4 F (36.3 C)  TempSrc:    Oral  SpO2: 99% 99% 95% 98%  Weight:      Height:        Intake/Output Summary (Last 24 hours) at 10/06/2019 1454 Last data filed at 10/06/2019 1129 Gross per 24 hour  Intake 900 ml  Output 5055 ml  Net -4155 ml   Filed Weights   10/03/19 0434 10/06/19 0512 10/06/19 0833  Weight: 54.7 kg 50 kg 50 kg    Examination:  GENERAL: Frail and chronically ill-appearing.  No apparent distress.  Nontoxic. HEENT: MMM.  NG tube to wall suction. NECK: Supple.  No apparent JVD.  RESP: On room air.  No IWOB.  Fair aeration bilaterally. CVS:  RRR. Heart sounds normal.  ABD/GI/GU: BS+. Abd soft.  Honeycomb dressing over abdomen DCI. MSK/EXT:  Moves extremities.  Significant muscle mass wasting. SKIN: Honeycomb dressing over abdomen DCI. NEURO: Awake, alert and oriented appropriately.  No apparent focal neuro deficit. PSYCH: Calm. Normal affect.  Procedures:  6/6-6/9 NG tube for SBO 6/11-NG tube replaced. 6/14-exploratory laparotomy with lysis of adhesion by Dr. Donne Hazel  Microbiology summarized: COVID-19 PCR negative.  Assessment & Plan: Small bowel obstruction-repeat CT abdomen and pelvis on 6/11 with persistent or recurrent SBO. Intractable nausea and vomiting -Exploratory laparotomy by Dr. Donne Hazel on 6/14 -Antiemetics and D51/2NS at 125 cc an hour -Plan for TPN on 6/15  AKI on CKD-3B with azotemia in patient with history of membranous nephropathy: Baseline Cr 2.7-3.0 > 3.05 (admit)>> 7.58> 3.43.  BUN 51 >>  106>> 81.  About 1.3 L urine output.  Renal ultrasound without acute finding.  FENa 1.5%. -IV fluid per nephrology -Avoid nephrotoxic meds -Continue monitoring.  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  Hypernatremia: Na 149>> 141> 146.  -IV fluid as above.  Hypokalemia/hypomagnesemia: Likely due to GI loss.  -Monitor and replenish as  appropriate  Fever: Spiked fever to 101.4 on 6/11, then 100.4 on 6/13.  No leukocytosis.  No obvious source of infection. -Continue monitoring -Further work-up if he spikes fever again.  Chronic diastolic CHF: Echo on 9/62/9528 with EF of 55%, G2-DD and RVSP of 33.65mHg.  He is probably hypovolemic from GI loss.  No cardiopulmonary symptoms.  Net negative.  Followed by Dr. BHaroldine Lawsoutpatient. -Cardiology following. -Monitor fluid status while on IV fluid -Hold home diuretics  Pulmonary hypertension: Echo as above. RHC in 06/2018 with PA of 81/26 and PCWP of 17 -Resume home Opsumit and Revatio once he started tolerating p.o.  Scleroderma-stable.  Followed by Dr. DEstanislado Pandy-Continue home Norvasc, sildenafil  History of CAD s/p PCA-no anginal symptoms. 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  H&H relatively stable. -Per nephrology -Continue monitoring.  Debility/physical deconditioning due to acute illness and hospitalization -PT/OT eval  Severe malnutrition:Body mass index is 16.76 kg/m.  Significant muscle mass and subcu fat loss. -Appreciate dietitian input Nutrition Problem: Severe Malnutrition Etiology: chronic illness (CHF, scleroderma) Signs/Symptoms: moderate fat depletion, severe fat depletion, moderate muscle depletion, severe muscle depletion Interventions: Ensure Enlive (each supplement provides 350kcal and 20 grams of protein), Magic cup, MVI   DVT prophylaxis: Subcu heparin Code Status: Full code Family Communication: Updated patient's wife at the bedside on 6/13. Status is: Inpatient  Remains inpatient appropriate because:IV treatments appropriate due to intensity of illness or inability to take PO, Inpatient level of care appropriate due to severity of illness and Ongoing emesis   Dispo: The patient is from: Home               Anticipated d/c is to: Home              Anticipated d/c date is: > 3 days              Patient currently is not medically stable to d/c.       Consultants:  General surgery Gastroenterology Nephrology Cardiology  Sch Meds:  Scheduled Meds: . Chlorhexidine Gluconate Cloth  6 each Topical Daily  . heparin  5,000 Units Subcutaneous Q8H  . sodium chloride flush  3 mL Intravenous Once   Continuous Infusions: . acetaminophen 1,000 mg (10/06/19 1218)  . dextrose 5 % and 0.45% NaCl 125 mL/hr at 10/06/19 1432  . methocarbamol (ROBAXIN) IV 500 mg (10/06/19 1355)   PRN Meds:.bisacodyl, HYDROmorphone (DILAUDID) injection, labetalol, ondansetron **OR** ondansetron (ZOFRAN) IV, phenol, prochlorperazine  Antimicrobials: Anti-infectives (From admission, onward)   Start     Dose/Rate Route Frequency Ordered Stop   10/06/19 0745  ceFAZolin (ANCEF) IVPB 2g/100 mL premix        2 g 200 mL/hr over 30 Minutes Intravenous  Once 10/06/19 0735 10/06/19 0839   10/06/19 0600  cefoTEtan (CEFOTAN) 2 g in sodium chloride 0.9 % 100 mL IVPB        2 g 200 mL/hr over 30 Minutes Intravenous On call to O.R. 10/05/19 0413206/14/21  1000       I have personally reviewed the following labs and images: CBC: Recent Labs  Lab 10/01/19 0358 10/02/19 0403 10/04/19 0633 10/05/19 0833 10/06/19 0237  WBC 5.0 5.4 9.5 7.0 7.5  HGB 9.5* 10.0* 10.8* 9.5* 9.9*  HCT 31.5* 32.9* 34.4* 30.6* 31.9*  MCV 102.6* 99.4 97.2 98.7 98.8  PLT 176 206 179 166 203   BMP &GFR Recent Labs  Lab 10/02/19 0403 10/03/19 0451 10/04/19 0633 10/05/19 0833 10/06/19 0237  NA 143 136 135 141 146*  K 3.5 3.3* 3.9 3.6 3.5  CL 104 93* 94* 108 112*  CO2 _0 21* 23  GLUCOSE 99 103* 96 107* 113*  BUN 82* 94* 106* 100* 81*  CREATININE 4.19* 7.32* 7.58* 4.74* 3.43*  CALCIUM 8.8* 9.0 8.4* 8.2* 8.2*  MG 2.7* 2.8* 2.3 2.4 2.3  PHOS 4.8* 7.0* 7.3* 6.2* 4.9*   Estimated Creatinine Clearance: 16.2 mL/min (A) (by C-G  formula based on SCr of 3.43 mg/dL (H)). Liver & Pancreas: Recent Labs  Lab 09/30/19 0358 09/30/19 0358 10/02/19 0403 10/03/19 0451 10/04/19 9969 10/05/19 0833 10/06/19 0237  AST 15  --   --   --   --   --   --   ALT 12  --   --   --   --   --   --   ALKPHOS 67  --   --   --   --   --   --   BILITOT 0.4  --   --   --   --   --   --   PROT 7.0  --   --   --   --   --   --   ALBUMIN 3.9   < > 3.7 4.1 3.4* 2.7* 2.5*   < > = values in this interval not displayed.   No results for input(s): LIPASE, AMYLASE in the last 168 hours. No results for input(s): AMMONIA in the last 168 hours. Diabetic: No results for input(s): HGBA1C in the last 72 hours. No results for input(s): GLUCAP in the last 168 hours. Cardiac Enzymes: No results for input(s): CKTOTAL, CKMB, CKMBINDEX, TROPONINI in the last 168 hours. No results for input(s): PROBNP in the last 8760 hours. Coagulation Profile: No results for input(s): INR, PROTIME in the last 168 hours. Thyroid Function Tests: No results for input(s): TSH, T4TOTAL, FREET4, T3FREE, THYROIDAB in the last 72 hours. Lipid Profile: No results for input(s): CHOL, HDL, LDLCALC, TRIG, CHOLHDL, LDLDIRECT in the last 72 hours. Anemia Panel: No results for input(s): VITAMINB12, FOLATE, FERRITIN, TIBC, IRON, RETICCTPCT in the last 72 hours. Urine analysis:    Component Value Date/Time   COLORURINE YELLOW 10/03/2019 2300   APPEARANCEUR HAZY (A) 10/03/2019 2300   LABSPEC 1.016 10/03/2019 2300   PHURINE 5.0 10/03/2019 2300   GLUCOSEU NEGATIVE 10/03/2019 2300   HGBUR NEGATIVE 10/03/2019 2300   BILIRUBINUR SMALL (A) 10/03/2019 2300   KETONESUR NEGATIVE 10/03/2019 2300   PROTEINUR 100 (A) 10/03/2019 2300   NITRITE NEGATIVE 10/03/2019 2300   LEUKOCYTESUR NEGATIVE 10/03/2019 2300   Sepsis Labs: Invalid input(s): PROCALCITONIN, [place]  Microbiology: Recent Results (from the past 240 hour(s))  SARS Coronavirus 2 by RT PCR (hospital order, performed in  [facility] hospital lab) Nasopharyngeal Nasopharyngeal Swab     Status: None   Collection Time: 09/28/19  4:59 PM   Specimen: Nasopharyngeal Swab  Result Value Ref Range Status   SARS Coronavirus 2 NEGATIVE NEGATIVE  Final    Comment: (NOTE) SARS-CoV-2 target nucleic acids are NOT DETECTED. The SARS-CoV-2 RNA is generally detectable in upper and lower respiratory specimens during the acute phase of infection. The lowest concentration of SARS-CoV-2 viral copies this assay can detect is 250 copies / mL. A negative result does not preclude SARS-CoV-2 infection and should not be used as the sole basis for treatment or other patient management decisions.  A negative result may occur with improper specimen collection / handling, submission of specimen other than nasopharyngeal swab, presence of viral mutation(s) within the areas targeted by this assay, and inadequate number of viral copies (<250 copies / mL). A negative result must be combined with clinical observations, patient history, and epidemiological information. Fact Sheet for Patients:   StrictlyIdeas.no Fact Sheet for Healthcare Providers: BankingDealers.co.za This test is not yet approved or cleared  by the [place] FDA and has been authorized for detection and/or diagnosis of SARS-CoV-2 by FDA under an Emergency Use Authorization (EUA).  This EUA will remain in effect (meaning this test can be used) for the duration of the COVID-19 declaration under Section 564(b)(1) of the Act, 21 U.S.C. section 360bbb-3(b)(1), unless the authorization is terminated or revoked sooner. Performed at [facility], [place] [address] [place], [place] [address]   Surgical pcr screen     Status: None   Collection Time: 10/05/19 10:07 AM   Specimen: Nasal Mucosa; Nasal Swab  Result Value Ref Range Status   MRSA, PCR NEGATIVE NEGATIVE Final   Staphylococcus aureus NEGATIVE NEGATIVE Final     Comment: (NOTE) The Xpert SA Assay (FDA approved for NASAL specimens in patients 54 years of age and older), is one component of a comprehensive surveillance program. It is not intended to diagnose infection nor to guide or monitor treatment. Performed at [facility], [place] [address], [place], [place] [address]     Radiology Studies: No results found.    Floella Ensz T. [place]  If 7PM-7AM, please contact night-coverage [email or web address] Password [facility] 10/06/2019, 2:54 PM

## 2019-10-06 NOTE — Anesthesia Procedure Notes (Signed)
Procedure Name: Intubation Date/Time: 10/06/2019 9:22 AM Performed by: Amadeo Garnet, CRNA Pre-anesthesia Checklist: Patient identified, Emergency Drugs available, Suction available and Patient being monitored Patient Re-evaluated:Patient Re-evaluated prior to induction Oxygen Delivery Method: Circle system utilized Induction Type: IV induction and Rapid sequence Laryngoscope Size: Mac and 4 Grade View: Grade I Tube type: Oral Tube size: 7.0 mm Airway Equipment and Method: Stylet Placement Confirmation: ETT inserted through vocal cords under direct vision,  positive ETCO2 and breath sounds checked- equal and bilateral Secured at: 22 cm Tube secured with: Tape Dental Injury: Teeth and Oropharynx as per pre-operative assessment

## 2019-10-06 NOTE — Progress Notes (Signed)
Nutrition Follow-up  DOCUMENTATION CODES:   Underweight, Severe malnutrition in context of chronic illness  INTERVENTION:   -TPN management per pharmacy; plan to start on 10/07/19 -RD will follow for diet advancement and add supplements as appropriate   NUTRITION DIAGNOSIS:   Severe Malnutrition related to chronic illness (CHF, scleroderma) as evidenced by moderate fat depletion, severe fat depletion, moderate muscle depletion, severe muscle depletion.  Ongoing  GOAL:   Patient will meet greater than or equal to 90% of their needs  Unmet  MONITOR:   PO intake, Supplement acceptance, Diet advancement, Labs, Weight trends, Skin, I & O's  REASON FOR ASSESSMENT:   Consult Assessment of nutrition requirement/status  ASSESSMENT:   [name] is a 59 y.o. male with medical history significant of scleroderma, HTN, CAD, HLD, systolic CHF who presents to ED with complaints of 2 days hx of n/v, unable to tolerate PO. Patient recently had discussed with GI regarding coughing up blood and was planned for EGD and colonoscopy at a later date. Patient later called GI with complaints of frequent bilious emesis without coffee grounds. Patient was referred to ED for further work up.  6/6- NGT placed 6/9- NGT removed, advanced to full liquid diet 6/10- downgraded to clear liquid diet 6/11- NPO, NGT placed 6/14- s/p Procedure: Exploratory laparotomy with lysis of adhesions x1 hour  Reviewed I/O's: -4 L x 24 hours and -8.6 L since admission  UOP: 1.3 L x 24 hours  NGT output: 2.7 L x 24 hours  Pt with high NGT output. Per MD notes, plan to start TPN on 10/07/19.   Reviewed wt; pt has experienced a 8.9% wt loss over the past week, which is likely related to fluid status (-8.6 L over the past 8 days).   Medications reviewed and include dextrose 5%-0.45% sodium chloride infusion @ 125 ml/hr.   Labs reviewed: Na: 146.   Diet Order:   Diet Order            Diet NPO time  specified Except for: Ice Chips  Diet effective now                 EDUCATION NEEDS:   Education needs have been addressed  Skin:  Skin Assessment: Skin Integrity Issues: Skin Integrity Issues:: Incisions Incisions: closed abdomen  Last BM:  10/04/19  Height:   Ht Readings from Last 1 Encounters:  10/06/19 5' 7.99" (1.727 m)    Weight:   Wt Readings from Last 1 Encounters:  10/06/19 50 kg    Ideal Body Weight:  63.6 kg  BMI:  Body mass index is 16.76 kg/m.  Estimated Nutritional Needs:   Kcal:  2000-2200  Protein:  105-110 grams  Fluid:  > 2 L   Loistine Chance, RD, LDN, [place] Registered Dietitian II Certified Diabetes Care and Education Specialist Please refer to [facility] for RD and/or RD on-call/weekend/after hours pager

## 2019-10-07 ENCOUNTER — Encounter (HOSPITAL_COMMUNITY): Payer: Self-pay | Admitting: Internal Medicine

## 2019-10-07 ENCOUNTER — Encounter (HOSPITAL_COMMUNITY): Payer: 59

## 2019-10-07 LAB — CBC
HCT: 32.5 % — ABNORMAL LOW (ref 39.0–52.0)
Hemoglobin: 9.7 g/dL — ABNORMAL LOW (ref 13.0–17.0)
MCH: 30.3 pg (ref 26.0–34.0)
MCHC: 29.8 g/dL — ABNORMAL LOW (ref 30.0–36.0)
MCV: 101.6 fL — ABNORMAL HIGH (ref 80.0–100.0)
Platelets: 189 10*3/uL (ref 150–400)
RBC: 3.2 MIL/uL — ABNORMAL LOW (ref 4.22–5.81)
RDW: 15.6 % — ABNORMAL HIGH (ref 11.5–15.5)
WBC: 10.6 10*3/uL — ABNORMAL HIGH (ref 4.0–10.5)
nRBC: 0 % (ref 0.0–0.2)

## 2019-10-07 LAB — RENAL FUNCTION PANEL
Albumin: 2.1 g/dL — ABNORMAL LOW (ref 3.5–5.0)
Anion gap: 13 (ref 5–15)
BUN: 78 mg/dL — ABNORMAL HIGH (ref 6–20)
CO2: 19 mmol/L — ABNORMAL LOW (ref 22–32)
Calcium: 7.9 mg/dL — ABNORMAL LOW (ref 8.9–10.3)
Chloride: 113 mmol/L — ABNORMAL HIGH (ref 98–111)
Creatinine, Ser: 3.39 mg/dL — ABNORMAL HIGH (ref 0.61–1.24)
GFR calc Af Amer: 22 mL/min — ABNORMAL LOW (ref >60)
GFR calc non Af Amer: 19 mL/min — ABNORMAL LOW (ref >60)
Glucose, Bld: 132 mg/dL — ABNORMAL HIGH (ref 70–99)
Phosphorus: 6.2 mg/dL — ABNORMAL HIGH (ref 2.5–4.6)
Potassium: 3.9 mmol/L (ref 3.5–5.1)
Sodium: 145 mmol/L (ref 135–145)

## 2019-10-07 LAB — MAGNESIUM: Magnesium: 2.3 mg/dL (ref 1.7–2.4)

## 2019-10-07 MED ORDER — ACETAMINOPHEN 10 MG/ML IV SOLN
1000.0000 mg | Freq: Four times a day (QID) | INTRAVENOUS | Status: AC
  Administered 2019-10-07 – 2019-10-08 (×4): 1000 mg via INTRAVENOUS
  Filled 2019-10-07 (×4): qty 100

## 2019-10-07 NOTE — Progress Notes (Signed)
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 Adin Hector.  Baseline creatinine 2.5 mg/dL.  Renal ultrasound revealed no evidence of obstruction.  Maintenance IV fluids have been given 1.5 L normal saline bolus administered as patient appears to be dry and cachectic.  Blood pressure 135/76 pulse 92 temperature 98 O2 sats 95%  Sodium 145 potassium 3.9 chloride 113 CO2 19 BUN 78 creatinine 3.39 glucose 132 calcium 7.9 phosphorus 6.2 magnesium 2.3 albumin 2.1 hemoglobin 9.7  IV D5 normal saline 125 cc an hour   Objective:  Vital signs in last 24 hours:  Temp:  [97.4 F (36.3 C)-98 F (36.7 C)] 98 F (36.7 C) (06/15 0751) Pulse Rate:  [89-99] 92 (06/15 0751) Resp:  [15-20] 15 (06/15 0751) BP: (127-149)/(73-86) 135/76 (06/15 0751) SpO2:  [95 %-99 %] 95 % (06/15 0751) Weight:  [50 kg] 50 kg (06/14 0833)  Weight change: 0 kg Filed Weights   10/03/19 0434 10/06/19 0512 10/06/19 0833  Weight: 54.7 kg 50 kg 50 kg    Intake/Output: I/O last 3 completed shifts: In: 1545.5 [I.V.:1095.5; IV Piggyback:450] Out: 4105 [MOQHU:7654; Emesis/NG output:1850; Other:950; Blood:30]   Intake/Output this shift:  No intake/output data recorded.  General: Cachectic older looking male lying on bed with NG tube in place Heart:RRR, s1s2 nl, no rubs Lungs: Clear b/l, no crackle Abdomen:soft, Non-tender, non-distended Extremities:No LE edema Neurology: Alert, awake and following commands.  No asterixis   Basic Metabolic Panel: Recent Labs  Lab 10/03/19 0451 10/03/19 0451 10/04/19 6503 10/04/19 5465 10/05/19 0833 10/06/19 0237 10/07/19 0334  NA 136  --  135  --  141 146* 145  K 3.3*  --  3.9  --  3.6 3.5 3.9  CL 93*  --  94*  --  108  112* 113*  CO2 27  --  23  --  21* 23 19*  GLUCOSE 103*  --  96  --  107* 113* 132*  BUN 94*  --  106*  --  100* 81* 78*  CREATININE 7.32*  --  7.58*  --  4.74* 3.43* 3.39*  CALCIUM 9.0   < > 8.4*   < > 8.2* 8.2* 7.9*  MG 2.8*  --  2.3  --  2.4 2.3 2.3  PHOS 7.0*  --  7.3*  --  6.2* 4.9* 6.2*   < > = values in this interval not displayed.    Liver Function Tests: Recent Labs  Lab 10/03/19 0451 10/04/19 6812 10/05/19 0833 10/06/19 0237 10/07/19 0334  ALBUMIN 4.1 3.4* 2.7* 2.5* 2.1*   No results for input(s): LIPASE, AMYLASE in the last 168 hours. No results for input(s): AMMONIA in the last 168 hours.  CBC: Recent Labs  Lab 10/02/19 0403 10/04/19 7517 10/05/19 0833 10/06/19 0237 10/07/19 0334  WBC 5.4 9.5 7.0 7.5 10.6*  HGB 10.0* 10.8* 9.5* 9.9* 9.7*  HCT 32.9* 34.4* 30.6* 31.9* 32.5*  MCV 99.4 97.2 98.7 98.8 101.6*  PLT 206 179 166 203 189    Cardiac Enzymes: No results for input(s): CKTOTAL, CKMB, CKMBINDEX, TROPONINI in the last 168 hours.  BNP: Invalid input(s): POCBNP  CBG: No results for input(s): GLUCAP in the last 168 hours.  Microbiology: Results for orders placed or performed during the hospital encounter of 09/28/19  SARS Coronavirus  2 by RT PCR (hospital order, performed in [facility] hospital lab) Nasopharyngeal Nasopharyngeal Swab     Status: None   Collection Time: 09/28/19  4:59 PM   Specimen: Nasopharyngeal Swab  Result Value Ref Range Status   SARS Coronavirus 2 NEGATIVE NEGATIVE Final    Comment: (NOTE) SARS-CoV-2 target nucleic acids are NOT DETECTED. The SARS-CoV-2 RNA is generally detectable in upper and lower respiratory specimens during the acute phase of infection. The lowest concentration of SARS-CoV-2 viral copies this assay can detect is 250 copies / mL. A negative result does not preclude SARS-CoV-2 infection and should not be used as the sole basis for treatment or other patient management decisions.  A negative result may  occur with improper specimen collection / handling, submission of specimen other than nasopharyngeal swab, presence of viral mutation(s) within the areas targeted by this assay, and inadequate number of viral copies (<250 copies / mL). A negative result must be combined with clinical observations, patient history, and epidemiological information. Fact Sheet for Patients:   StrictlyIdeas.no Fact Sheet for Healthcare Providers: BankingDealers.co.za This test is not yet approved or cleared  by the [place] FDA and has been authorized for detection and/or diagnosis of SARS-CoV-2 by FDA under an Emergency Use Authorization (EUA).  This EUA will remain in effect (meaning this test can be used) for the duration of the COVID-19 declaration under Section 564(b)(1) of the Act, 21 U.S.C. section 360bbb-3(b)(1), unless the authorization is terminated or revoked sooner. Performed at [facility], [place] [address], [place], [place] [address]   Surgical pcr screen     Status: None   Collection Time: 10/05/19 10:07 AM   Specimen: Nasal Mucosa; Nasal Swab  Result Value Ref Range Status   MRSA, PCR NEGATIVE NEGATIVE Final   Staphylococcus aureus NEGATIVE NEGATIVE Final    Comment: (NOTE) The Xpert SA Assay (FDA approved for NASAL specimens in patients 47 years of age and older), is one component of a comprehensive surveillance program. It is not intended to diagnose infection nor to guide or monitor treatment. Performed at [facility], [place] [address] [place], [place] [address]     Coagulation Studies: No results for input(s): LABPROT, INR in the last 72 hours.  Urinalysis: No results for input(s): COLORURINE, LABSPEC, PHURINE, GLUCOSEU, HGBUR, BILIRUBINUR, KETONESUR, PROTEINUR, UROBILINOGEN, NITRITE, LEUKOCYTESUR in the last 72 hours.  Invalid input(s): APPERANCEUR    Imaging: No results found.   Medications:   . dextrose  5 % and 0.45% NaCl 125 mL/hr at 10/07/19 0012  . methocarbamol (ROBAXIN) IV 500 mg (10/07/19 0654)   . Chlorhexidine Gluconate Cloth  6 each Topical Daily  . heparin  5,000 Units Subcutaneous Q8H  . sodium chloride flush  3 mL Intravenous Once   bisacodyl, HYDROmorphone (DILAUDID) injection, labetalol, ondansetron **OR** ondansetron (ZOFRAN) IV, phenol, prochlorperazine  Assessment/ Plan:   Acute kidney injury setting of baseline chronic kidney disease stage IV.  Baseline creatinine appears about 2.5.  He has membranous nephropathy per biopsy.  Renal ultrasound does not reveal any evidence of hydronephrosis.  Patient appeared to be cachectic and dry on admission.  Received IV fluids.  He continues to receive 125 cc/h.  He continues to make urine with 875 cc out 10/06/2019.  NG tube output.  We will continue to follow as renal function slowly improves  Small bowel obstruction conservative management initially underwent exploratory laparotomy with lysis of adhesions 10/06/2018.  Hypertension/volume continue to follow blood pressures acceptable.  Continue IV fluids at 125 cc an hour D5 normal saline.  Anemia received IV iron and Aranesp.  Hypernatremia mild we will continue to follow  Hypokalemia appears to resolved.   LOS: [place] _0 _1 :00 AM

## 2019-10-07 NOTE — Progress Notes (Signed)
PROGRESS NOTE  [name] RZN:[id number] DOB: Sep 13, 1960   PCP: [name], MD  Patient is from: Home.  DOA: 09/28/2019 LOS: 9  Brief Narrative / Interim history: 59 year old male with history of systolic CHF, scleroderma, HTN, CAD/PCI, HLD and malnutrition presenting with 2 days of nausea and vomiting and found to have small bowel obstruction.  He was managed conservatively with NG tube bowel decompression and IV fluid with improvement in symptoms.  NG tube discontinued on 10/01/2019.  He was cleared for discharge by general surgery from surgical standpoint. However, patient has significant emesis after NG tube removal.  General surgery recommended reinserting NGT.  Initially, patient refused NG tube replacement but he continued to have nausea and vomiting without bowel movement.  KUB without signs of SBO.  Attempted water enema without significant results continue to have intractable nausea and vomiting.  General surgery reconsulted on 6/11.  NG tube replaced.  CT abdomen and pelvis ordered.  Lumbar GI consulted as well for possible ileus/constipation.  Repeat CT abdomen and pelvis with persistent or recurrent SBO.  Cardiology consulted for preop eval in case he needs surgery.  Patient underwent ex lap with lysis of the adhesion by Dr. Donne Hazel on 10/06/2019.  Patient also developed worsening AKI with significant azotemia. Nephrology consulted.  Renal ultrasound without acute finding.  Received IV fluid boluses with increased maintenance rate.  Renal function improved.  Subjective: Seen and examined earlier this morning.  No major events overnight of this morning.  No flatus or bowel movements yet.  Denies nausea, vomiting, abdominal pain, chest pain or dyspnea.  Likes to have some cough medication to be able to cough up phlegm.  Objective: Vitals:   10/06/19 1306 10/07/19 0328 10/07/19 0751 10/07/19 1354  BP: 127/77 (!) 146/73 135/76 133/74  Pulse: 98 93 92 95  Resp: _0 Temp: (!) 97.4 F (36.3 C) 98 F (36.7 C) 98 F (36.7 C) 98.2 F (36.8 C)  TempSrc: Oral Oral Oral Oral  SpO2: 98% 95% 95% 97%  Weight:      Height:        Intake/Output Summary (Last 24 hours) at 10/07/2019 1551 Last data filed at 10/07/2019 1500 Gross per 24 hour  Intake 1704.78 ml  Output 400 ml  Net 1304.78 ml   Filed Weights   10/03/19 0434 10/06/19 0512 10/06/19 0833  Weight: 54.7 kg 50 kg 50 kg    Examination:  GENERAL: Frail and chronically ill-appearing.  No apparent distress.  Nontoxic. HEENT: MMM.  NG tube to wall suction with bilious output. NECK: Supple.  No apparent JVD.  RESP: On room air.  No IWOB.  Fair aeration bilaterally. CVS:  RRR. Heart sounds normal.  ABD/GI/GU: No bowel sounds. Abd soft, NTND.  Honeycomb dressing over surgical wound DCI. MSK/EXT:  Moves extremities. No apparent deformity. No edema.  SKIN: Honeycomb dressing over surgical wound DCI. NEURO: Awake, alert and oriented appropriately.  No apparent focal neuro deficit. PSYCH: Calm. Normal affect.  Procedures:  6/6-6/9 NG tube for SBO 6/11-NG tube replaced. 6/14-exploratory laparotomy with lysis of adhesion by Dr. Donne Hazel  Microbiology summarized: COVID-19 PCR negative.  Assessment & Plan: Small bowel obstruction-repeat CT abdomen and pelvis on 6/11 with persistent or recurrent SBO. Intractable nausea and vomiting-resolved. -Failed conservative measures with NG tube bowel decompression. -Exploratory laparotomy by Dr. Donne Hazel on 6/14 -Antiemetics and D51/2NS at 125 cc an hour -Surgery started ice chips.  AKI on CKD-3B with azotemia in patient with history of  membranous nephropathy: Baseline Cr 2.7-3.0 > 3.05 (admit)>> 7.58> 3.39.  BUN 51 >> 106>> 81>78.  About 1.0 L UOP/24 hrs.  Renal US without acute finding.  FENa 1.5%. -IV fluid per nephrology -Avoid nephrotoxic meds -Continue monitoring.  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  Hypernatremia: Na 149>> 141> 145.  -IV fluid as above.  Hypokalemia/hypomagnesemia: Likely due to GI loss.  -Monitor and replenish as appropriate  Metabolic acidosis: Likely due to AKI and IV fluid. -Continue monitoring -Nephrology on board  Fever: Spiked fever to 101.4 on 6/11, then 100.4 on 6/13. No obvious source of infection.  Mild leukocytosis likely demargination after surgery. -Continue monitoring -Further work-up if he spikes fever again.  Chronic diastolic CHF: Echo on 5/00/9381 with EF of 55%, G2-DD and RVSP of 33.54mHg.  He is probably hypovolemic from GI loss.  No cardiopulmonary symptoms.  Net negative.  Followed by Dr. BHaroldine Lawsoutpatient. -Cardiology following. -Monitor fluid status while on IV fluid -Hold home diuretics  Pulmonary hypertension: Echo as above. RHC in 06/2018 with PA of 81/26 and PCWP of 17 -Resume home Opsumit and Revatio once he started tolerating p.o.  Scleroderma-stable.  Followed by Dr. DEstanislado Pandy-Continue home Norvasc, sildenafil  History of CAD s/p PCA-no anginal symptoms. 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  H&H relatively stable. -Per nephrology -Continue monitoring.  Cough -Expectorants  Debility/physical deconditioning due to acute illness and hospitalization -PT/OT eval  Severe malnutrition:Body mass index is 16.76 kg/m.  Significant muscle mass and subcu fat loss. -Appreciate dietitian input Nutrition Problem: Severe Malnutrition Etiology: chronic illness (CHF, scleroderma) Signs/Symptoms: moderate fat depletion, severe fat depletion, moderate muscle depletion, severe muscle depletion Interventions: Ensure Enlive (each supplement provides 350kcal and 20 grams of protein), Magic cup, MVI   DVT prophylaxis: Subcu heparin Code Status: Full code Family Communication:  Updated patient's wife at the bedside Status is: Inpatient  Remains inpatient appropriate because:IV treatments appropriate due to intensity of illness or inability to take PO, Inpatient level of care appropriate due to severity of illness and Ongoing emesis   Dispo: The patient is from: Home              Anticipated d/c is to: Home              Anticipated d/c date is: > 3 days              Patient currently is not medically stable to d/c.       Consultants:  General surgery Gastroenterology Nephrology Cardiology  Sch Meds:  Scheduled Meds: . Chlorhexidine Gluconate Cloth  6 each Topical Daily  . heparin  5,000 Units Subcutaneous Q8H  . sodium chloride flush  3 mL Intravenous Once   Continuous Infusions: . acetaminophen Stopped (10/07/19 1158)  . dextrose 5 % and 0.45% NaCl 125 mL/hr at 10/07/19 0844  . methocarbamol (ROBAXIN) IV Stopped (10/07/19 1444)   PRN Meds:.bisacodyl, HYDROmorphone (DILAUDID) injection, labetalol, ondansetron **OR** ondansetron (ZOFRAN) IV, phenol, prochlorperazine  Antimicrobials: Anti-infectives (From admission, onward)   Start     Dose/Rate Route Frequency Ordered Stop   10/06/19 0745  ceFAZolin (ANCEF) IVPB 2g/100 mL premix        2 g 200 mL/hr over 30 Minutes Intravenous  Once 10/06/19 0735 10/06/19 1700   10/06/19 0600  cefoTEtan (CEFOTAN) 2  g in sodium chloride 0.9 % 100 mL IVPB        2 g 200 mL/hr over 30 Minutes Intravenous On call to O.R. 10/05/19 0810 10/07/19 0843       I have personally reviewed the following labs and images: CBC: Recent Labs  Lab 10/02/19 0403 10/04/19 0633 10/05/19 0833 10/06/19 0237 10/07/19 0334  WBC 5.4 9.5 7.0 7.5 10.6*  HGB 10.0* 10.8* 9.5* 9.9* 9.7*  HCT 32.9* 34.4* 30.6* 31.9* 32.5*  MCV 99.4 97.2 98.7 98.8 101.6*  PLT 206 179 166 203 189   BMP &GFR Recent Labs  Lab 10/03/19 0451 10/04/19 0633 10/05/19 0833 10/06/19 0237 10/07/19 0334  NA 136 135 141 146* 145  K 3.3* 3.9 3.6 3.5  3.9  CL 93* 94* 108 112* 113*  CO2 27 23 21* 23 19*  GLUCOSE 103* 96 107* 113* 132*  BUN 94* 106* 100* 81* 78*  CREATININE 7.32* 7.58* 4.74* 3.43* 3.39*  CALCIUM 9.0 8.4* 8.2* 8.2* 7.9*  MG 2.8* 2.3 2.4 2.3 2.3  PHOS 7.0* 7.3* 6.2* 4.9* 6.2*   Estimated Creatinine Clearance: 16.4 mL/min (A) (by C-G formula based on SCr of 3.39 mg/dL (H)). Liver & Pancreas: Recent Labs  Lab 10/03/19 0451 10/04/19 4709 10/05/19 0833 10/06/19 0237 10/07/19 0334  ALBUMIN 4.1 3.4* 2.7* 2.5* 2.1*   No results for input(s): LIPASE, AMYLASE in the last 168 hours. No results for input(s): AMMONIA in the last 168 hours. Diabetic: No results for input(s): HGBA1C in the last 72 hours. No results for input(s): GLUCAP in the last 168 hours. Cardiac Enzymes: No results for input(s): CKTOTAL, CKMB, CKMBINDEX, TROPONINI in the last 168 hours. No results for input(s): PROBNP in the last 8760 hours. Coagulation Profile: No results for input(s): INR, PROTIME in the last 168 hours. Thyroid Function Tests: No results for input(s): TSH, T4TOTAL, FREET4, T3FREE, THYROIDAB in the last 72 hours. Lipid Profile: No results for input(s): CHOL, HDL, LDLCALC, TRIG, CHOLHDL, LDLDIRECT in the last 72 hours. Anemia Panel: No results for input(s): VITAMINB12, FOLATE, FERRITIN, TIBC, IRON, RETICCTPCT in the last 72 hours. Urine analysis:    Component Value Date/Time   COLORURINE YELLOW 10/03/2019 2300   APPEARANCEUR HAZY (A) 10/03/2019 2300   LABSPEC 1.016 10/03/2019 2300   PHURINE 5.0 10/03/2019 2300   GLUCOSEU NEGATIVE 10/03/2019 2300   HGBUR NEGATIVE 10/03/2019 2300   BILIRUBINUR SMALL (A) 10/03/2019 2300   KETONESUR NEGATIVE 10/03/2019 2300   PROTEINUR 100 (A) 10/03/2019 2300   NITRITE NEGATIVE 10/03/2019 2300   LEUKOCYTESUR NEGATIVE 10/03/2019 2300   Sepsis Labs: Invalid input(s): PROCALCITONIN, [place]  Microbiology: Recent Results (from the past 240 hour(s))  SARS Coronavirus 2 by RT PCR (hospital  order, performed in [facility] hospital lab) Nasopharyngeal Nasopharyngeal Swab     Status: None   Collection Time: 09/28/19  4:59 PM   Specimen: Nasopharyngeal Swab  Result Value Ref Range Status   SARS Coronavirus 2 NEGATIVE NEGATIVE Final    Comment: (NOTE) SARS-CoV-2 target nucleic acids are NOT DETECTED. The SARS-CoV-2 RNA is generally detectable in upper and lower respiratory specimens during the acute phase of infection. The lowest concentration of SARS-CoV-2 viral copies this assay can detect is 250 copies / mL. A negative result does not preclude SARS-CoV-2 infection and should not be used as the sole basis for treatment or other patient management decisions.  A negative result may occur with improper specimen collection / handling, submission of specimen other than nasopharyngeal swab, presence of viral mutation(s)  within the areas targeted by this assay, and inadequate number of viral copies (<250 copies / mL). A negative result must be combined with clinical observations, patient history, and epidemiological information. Fact Sheet for Patients:   StrictlyIdeas.no Fact Sheet for Healthcare Providers: BankingDealers.co.za This test is not yet approved or cleared  by the [place] FDA and has been authorized for detection and/or diagnosis of SARS-CoV-2 by FDA under an Emergency Use Authorization (EUA).  This EUA will remain in effect (meaning this test can be used) for the duration of the COVID-19 declaration under Section 564(b)(1) of the Act, 21 U.S.C. section 360bbb-3(b)(1), unless the authorization is terminated or revoked sooner. Performed at [facility], [place] [address], [place], [place] [address]   Surgical pcr screen     Status: None   Collection Time: 10/05/19 10:07 AM   Specimen: Nasal Mucosa; Nasal Swab  Result Value Ref Range Status   MRSA, PCR NEGATIVE NEGATIVE Final   Staphylococcus aureus NEGATIVE  NEGATIVE Final    Comment: (NOTE) The Xpert SA Assay (FDA approved for NASAL specimens in patients 73 years of age and older), is one component of a comprehensive surveillance program. It is not intended to diagnose infection nor to guide or monitor treatment. Performed at [facility], [place] [address], [place], [place] [address]     Radiology Studies: No results found.    Jaqualyn Juday T. Taft  If 7PM-7AM, please contact night-coverage [email or web address] Password [facility] 10/07/2019, 3:51 PM

## 2019-10-07 NOTE — Plan of Care (Signed)

## 2019-10-07 NOTE — Progress Notes (Signed)
[facility] Progress Note  1 Day Post-Op  Subjective: Patient reports he has been "in and out this AM". No bowel function yet. Some abdominal pain, but has only taken one dose IV pain medication outside of scheduled IV tylenol and IV robaxin. Getting ready to work with PT this AM.   Objective: Vital signs in last 24 hours: Temp:  [97.4 F (36.3 C)-98 F (36.7 C)] 98 F (36.7 C) (06/15 0751) Pulse Rate:  [89-99] 92 (06/15 0751) Resp:  [15-20] 15 (06/15 0751) BP: (127-149)/(73-86) 135/76 (06/15 0751) SpO2:  [95 %-99 %] 95 % (06/15 0751) Last BM Date: 10/04/19  Intake/Output from previous day: 06/14 0701 - 06/15 0700 In: 1545.5 [I.V.:1095.5; IV Piggyback:450] Out: 1930 [Urine:900; Emesis/NG output:50; Blood:30] Intake/Output this shift: No intake/output data recorded.  PE: General: WD, cachectic male who is laying in bed in NAD Heart: regular, rate, and rhythm.  Normal s1,s2. No obvious murmurs, gallops, or rubs noted.  Palpable radial and pedal pulses bilaterally Lungs: CTAB, no wheezes, rhonchi, or rales noted.  Respiratory effort nonlabored Abd: soft, appropriately ttp, mildly distended, BS hypoactive, NGT in place with dark reddish-brown drainage, midline dressing with small amount bloody drainage    Lab Results:  Recent Labs    10/06/19 0237 10/07/19 0334  WBC 7.5 10.6*  HGB 9.9* 9.7*  HCT 31.9* 32.5*  PLT 203 189   BMET Recent Labs    10/06/19 0237 10/07/19 0334  NA 146* 145  K 3.5 3.9  CL 112* 113*  CO2 23 19*  GLUCOSE 113* 132*  BUN 81* 78*  CREATININE 3.43* 3.39*  CALCIUM 8.2* 7.9*   PT/INR No results for input(s): LABPROT, INR in the last 72 hours. CMP     Component Value Date/Time   NA 145 10/07/2019 0334   NA 145 (H) 06/11/2018 0852   K 3.9 10/07/2019 0334   CL 113 (H) 10/07/2019 0334   CO2 19 (L) 10/07/2019 0334   GLUCOSE 132 (H) 10/07/2019 0334   BUN 78 (H) 10/07/2019 0334   BUN 34 (H) 06/11/2018 0852   CREATININE 3.39 (H)  10/07/2019 0334   CREATININE 2.01 (H) 03/23/2017 1527   CALCIUM 7.9 (L) 10/07/2019 0334   PROT 7.0 09/30/2019 0358   ALBUMIN 2.1 (L) 10/07/2019 0334   AST 15 09/30/2019 0358   ALT 12 09/30/2019 0358   ALKPHOS 67 09/30/2019 0358   BILITOT 0.4 09/30/2019 0358   GFRNONAA 19 (L) 10/07/2019 0334   GFRNONAA 36 (L) 03/23/2017 1527   GFRAA 22 (L) 10/07/2019 0334   GFRAA 41 (L) 03/23/2017 1527   Lipase     Component Value Date/Time   LIPASE 40 09/28/2019 1310       Studies/Results: No results found.  Anti-infectives: Anti-infectives (From admission, onward)   Start     Dose/Rate Route Frequency Ordered Stop   10/06/19 0745  ceFAZolin (ANCEF) IVPB 2g/100 mL premix        2 g 200 mL/hr over 30 Minutes Intravenous  Once 10/06/19 0735 10/06/19 1700   10/06/19 0600  cefoTEtan (CEFOTAN) 2 g in sodium chloride 0.9 % 100 mL IVPB        2 g 200 mL/hr over 30 Minutes Intravenous On call to O.R. 10/05/19 0810 10/06/19 1000       Assessment/Plan HTN HLD CAD  Hypothyroidism ARF- Cr3.39, improving and nephrology following Anion gap acidosis - Per TRH -  SBO S/p exploratory laparotomy and LOA 10/06/19 Dr. Donne Hazel - POD#1 - continue NGT on  LIWS today, await bowel function - PT/OT, mobilize - ok to have ice chips  - continue foley today and likely d/c tomorrow if doing well  FEN -NPO, IVF, NGT to LIWS VTE -SCDs, SQ heparin  ID -none  LOS: 9 days    Norm Parcel , [facility] Surgery 10/07/2019, 8:35 AM Please see Amion for pager number during day hours 7:00am-4:30pm

## 2019-10-07 NOTE — Progress Notes (Addendum)
Physical Therapy Treatment Patient Details Name: [name] MRN: [id number] DOB: [birth year] Today's Date: 10/07/2019    History of Present Illness Pt is a 59 year old man admitted on 09/28/19 with N/V due to SBO. NGT placed, removed 10/01/19 with resumption of vomiting. Hospital course complicated by mild hypernatremia and AKI. s/p exploratory laparatomy with lysis of adhersions 6/14. PMH: CHF, scleroderma, HTN CAD/PCI, malnutrition.    PT Comments    Patient lethargic with flat affect upon arrival. Appears confused today s/p exp lap 6/14, question pain medicine effects? Increased time to perform any small movement (5-10 mins) with max step by step cues for sequencing/technique. Getting easily irritated, "do not rush me, I will get up when I say I will get up." Poor attention and difficulty following simple commands. Tolerated standing and taking a few steps to chair with Min guard assist and RW. Goals updated post surgery and due to downgrade in functional mobility/cognition. May require follow up at d/c pending improvement. Will follow acutely.    Follow Up Recommendations  Home health PT;Supervision for mobility/OOB     Equipment Recommendations  Other (comment) (TBA pending improvement)    Recommendations for Other Services       Precautions / Restrictions Precautions Precautions: Fall Restrictions Weight Bearing Restrictions: No    Mobility  Bed Mobility Overal bed mobility: Needs Assistance Bed Mobility: Rolling;Sidelying to Sit Rolling: Supervision Sidelying to sit: Supervision;HOB elevated       General bed mobility comments: Increased time, a long time to move LEs to EOB and roll onto side to sit upright, with step by step cues for technique. Max cues for coaxing/participation.  Transfers Overall transfer level: Needs assistance Equipment used: Rolling walker (2 wheeled) Transfers: Sit to/from Stand Sit to Stand: Min assist         General transfer  comment: ASsist to power to standing after sitting EOB for 10 minutes, needs constant cues to stay attended to task.  Ambulation/Gait Ambulation/Gait assistance: Min guard Gait Distance (Feet): 5 Feet Assistive device: Rolling walker (2 wheeled) Gait Pattern/deviations: Trunk flexed;Shuffle Gait velocity: decreased   General Gait Details: Took a few steps to get to chair with flexed trunk leaning on RW; not able to stand upright.   Stairs             Wheelchair Mobility    Modified Rankin (Stroke Patients Only)       Balance Overall balance assessment: Mild deficits observed, not formally tested;Needs assistance Sitting-balance support: Feet supported;Bilateral upper extremity supported Sitting balance-Leahy Scale: Poor Sitting balance - Comments: Leaning on RW for support   Standing balance support: During functional activity Standing balance-Leahy Scale: Poor Standing balance comment: Requires UE support                            Cognition Arousal/Alertness: Lethargic Behavior During Therapy: Flat affect Overall Cognitive Status: Impaired/Different from baseline Area of Impairment: Attention;Following commands;Problem solving;Safety/judgement;Awareness;Memory                   Current Attention Level: Focused Memory: Decreased short-term memory;Decreased recall of precautions Following Commands: Follows one step commands with increased time;Follows one step commands inconsistently Safety/Judgement: Decreased awareness of deficits;Decreased awareness of safety Awareness: Intellectual Problem Solving: Slow processing;Decreased initiation;Requires verbal cues;Requires tactile cues General Comments: Pt very confused today; not making sense with speech. Impaired attention. Requires repetition and max coaxing cuing to partipate/attend to task. At least 5-10 mins to  perform any movement.      Exercises      General Comments        Pertinent  Vitals/Pain Pain Assessment: Faces Faces Pain Scale: Hurts even more Pain Location: abdomen Pain Descriptors / Indicators: Operative site guarding;Sore;Guarding Pain Intervention(s): Monitored during session;Limited activity within patient's tolerance;Repositioned    Home Living                      Prior Function            PT Goals (current goals can now be found in the care plan section) Acute Rehab PT Goals Patient Stated Goal: get this tube out of my nose PT Goal Formulation: With patient Time For Goal Achievement: 10/21/19 Potential to Achieve Goals: Fair Progress towards PT goals: Not progressing toward goals - comment (post surgery)    Frequency    Min 3X/week      PT Plan Current plan remains appropriate    Co-evaluation              AM-PAC PT "6 Clicks" Mobility   Outcome Measure  Help needed turning from your back to your side while in a flat bed without using bedrails?: None Help needed moving from lying on your back to sitting on the side of a flat bed without using bedrails?: A Little Help needed moving to and from a bed to a chair (including a wheelchair)?: None Help needed standing up from a chair using your arms (e.g., wheelchair or bedside chair)?: A Little Help needed to walk in hospital room?: A Little Help needed climbing 3-5 steps with a railing? : A Lot 6 Click Score: 19    End of Session   Activity Tolerance: Patient limited by lethargy;Patient limited by pain Patient left: in chair;with call bell/phone within reach;with chair alarm set (chair alarm under pt but no box) Nurse Communication: Mobility status PT Visit Diagnosis: Unsteadiness on feet (R26.81);Pain;Difficulty in walking, not elsewhere classified (R26.2) Pain - part of body:  (abdomen)     Time: 8850-2774 PT Time Calculation (min) (ACUTE ONLY): 38 min  Charges:  $Therapeutic Activity: 38-52 mins                     Marisa Severin, PT, DPT Acute Rehabilitation  Services Pager [phone] Office [phone]       [name] 10/07/2019, 2:33 PM

## 2019-10-07 NOTE — Progress Notes (Addendum)
Advanced Heart Failure Rounding Note  PCP-Cardiologist: [name], MD  [facility]: Dr. Haroldine Laws   Subjective:    Admitted w/ SBO. S/p exploratory laparotomy yesterday w/ lysis of adhesions.    Remains w/ NGT. NPO. PAH meds and diuretic on hold.   Also w/ AKI on CKD, suspect 2/2 dehydration. SCr improving w/ IVFs, 7.58>>4.74>>3.43>>3.39 (baseline ~2.5).   OOB sitting up in chair. No cardiac complaints.   Objective:   Weight Range: 50 kg Body mass index is 16.76 kg/m.   Vital Signs:   Temp:  [98 F (36.7 C)] 98 F (36.7 C) (06/15 0751) Pulse Rate:  [92-93] 92 (06/15 0751) Resp:  [15-16] 15 (06/15 0751) BP: (135-146)/(73-76) 135/76 (06/15 0751) SpO2:  [95 %] 95 % (06/15 0751) Last BM Date: 10/04/19  Weight change: Filed Weights   10/03/19 0434 10/06/19 0512 10/06/19 0833  Weight: 54.7 kg 50 kg 50 kg    Intake/Output:   Intake/Output Summary (Last 24 hours) at 10/07/2019 1450 Last data filed at 10/07/2019 0751 Gross per 24 hour  Intake 645.45 ml  Output 400 ml  Net 245.45 ml      Physical Exam    General:  cachetic/ chronically ill AAM w/ sclerodermic features. No resp difficulty HEENT: Normal + NGT Neck: Supple. JVP . Carotids 2+ bilat; no bruits. No lymphadenopathy or thyromegaly appreciated. Cor: PMI nondisplaced. Regular rate & rhythm. No rubs, gallops or murmurs. Lungs: Clear Abdomen: Soft, nontender, nondistended. No hepatosplenomegaly. No bruits or masses. Good bowel sounds. Extremities: No cyanosis, clubbing, rash, thin extremities, no edema, + sclerodactyly  GU: + Foley  Neuro: Alert & orientedx3, cranial nerves grossly intact. moves all 4 extremities w/o difficulty. Affect pleasant   Labs    CBC Recent Labs    10/06/19 0237 10/07/19 0334  WBC 7.5 10.6*  HGB 9.9* 9.7*  HCT 31.9* 32.5*  MCV 98.8 101.6*  PLT 203 858   Basic Metabolic Panel Recent Labs    10/06/19 0237 10/07/19 0334  NA 146* 145  K 3.5 3.9  CL 112* 113*  CO2 23  19*  GLUCOSE 113* 132*  BUN 81* 78*  CREATININE 3.43* 3.39*  CALCIUM 8.2* 7.9*  MG 2.3 2.3  PHOS 4.9* 6.2*   Liver Function Tests Recent Labs    10/06/19 0237 10/07/19 0334  ALBUMIN 2.5* 2.1*   No results for input(s): LIPASE, AMYLASE in the last 72 hours. Cardiac Enzymes No results for input(s): CKTOTAL, CKMB, CKMBINDEX, TROPONINI in the last 72 hours.  BNP: BNP (last 3 results) Recent Labs    05/12/19 1026 06/11/19 1204 09/28/19 1310  BNP 297.0* 197.3* 184.7*    ProBNP (last 3 results) No results for input(s): PROBNP in the last 8760 hours.   D-Dimer No results for input(s): DDIMER in the last 72 hours. Hemoglobin A1C No results for input(s): HGBA1C in the last 72 hours. Fasting Lipid Panel No results for input(s): CHOL, HDL, LDLCALC, TRIG, CHOLHDL, LDLDIRECT in the last 72 hours. Thyroid Function Tests No results for input(s): TSH, T4TOTAL, T3FREE, THYROIDAB in the last 72 hours.  Invalid input(s): FREET3  Other results:   Imaging     No results found.   Medications:     Scheduled Medications: . Chlorhexidine Gluconate Cloth  6 each Topical Daily  . heparin  5,000 Units Subcutaneous Q8H  . sodium chloride flush  3 mL Intravenous Once     Infusions: . acetaminophen 1,000 mg (10/07/19 1142)  . dextrose 5 % and 0.45% NaCl 125  mL/hr at 10/07/19 0844  . methocarbamol (ROBAXIN) IV 500 mg (10/07/19 1414)     PRN Medications:  bisacodyl, HYDROmorphone (DILAUDID) injection, labetalol, ondansetron **OR** ondansetron (ZOFRAN) IV, phenol, prochlorperazine  Assessment/Plan    1. SBO - s/p exploratory laparotomy yesterday w/ lysis of adhesions. - NPO + NGT. Management per general surgery   2. AKI on CKD IIIb - SCr peaked to 7.32 (baseline ~2.5) - 2/2 volume depletion/dehydration from SBO  - improving w/ IVFs. SCr down to 3.39 today  - nephrology following  3. Pulmonary HTN in setting of scleroderma - PAH (WHO Group I) - R/LHC 07/01/18: mod  to severe PAH, no evidence of tamponade. PA 81/26 (48), PCW 17, PVC 6.0 - Echo 02/03/19 showed moderately elevatedright ventricular systolic pressureat 17.4 mmHg. - Echo 1/202: right ventricular systolic pressure 71.5 mmHg. RV normal. LVEF 55%.  - on  sildenafil  40 mg TID PTA - on opsumit 10 mg daily PTA - meds on hold for SBO. Currently NPO  - home diuretic, torsemide, also on hold for AKI/ dehydration   4. CAD - s/p previous RCA stent. - LHC 07/01/18: Non obstructive CAD with patent RCA stent. - Nos/s ischemia - Off ASA with GIB.  - statin on hold (NPO status)  5. Scleroderma - Follows with Dr. Estanislado Pandy  6.  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 Nilda Simmer developed large recurrent pericardial effusion w/o tamponade and moderate right sided pleural effusion requiring repeat intervention - s/p right VATSdrainage of his pleural effusion and pericardial window10/19/20. - Fluid analysis c/w transudative effusion  - Repeat echo in 1/21 showed only small effusion   Stable from a cardiac/ pulmonary hypertension standpoint. Resume back sildenafil and opsumit, once able to tolerate NPO. We will follow along from a distance.   Length of Stay: [address] PA-C  10/07/2019, 2:50 PM  Advanced Heart Failure Team Pager [phone] (M-F; 7a - 4p)  Please contact [facility] for night-coverage after hours (4p -7a ) and weekends on [email or web address]  Patient seen and examined with the above-signed Advanced Practice Provider and/or Housestaff. I personally reviewed laboratory data, imaging studies and relevant notes. I independently examined the patient and formulated the important aspects of the plan. I have edited the note to reflect any of my changes or salient points. I have personally discussed the plan with the patient and/or family.  He tolerate ex-lap and lysis of adhesions well yesterday. Renal function  continues to improve. Remains NPO and off PAH meds. Hemodynamically stable. Restart PAH meds as possible. We will continue to follow along at a distance.   Glori Bickers, MD  6:10 PM

## 2019-10-08 ENCOUNTER — Ambulatory Visit (HOSPITAL_COMMUNITY)
Admission: RE | Admit: 2019-10-08 | Payer: 59 | Source: Ambulatory Visit | Attending: Cardiovascular Disease | Admitting: Cardiovascular Disease

## 2019-10-08 ENCOUNTER — Inpatient Hospital Stay: Payer: Self-pay

## 2019-10-08 LAB — CBC
HCT: 27.5 % — ABNORMAL LOW (ref 39.0–52.0)
Hemoglobin: 8.3 g/dL — ABNORMAL LOW (ref 13.0–17.0)
MCH: 30.6 pg (ref 26.0–34.0)
MCHC: 30.2 g/dL (ref 30.0–36.0)
MCV: 101.5 fL — ABNORMAL HIGH (ref 80.0–100.0)
Platelets: 177 10*3/uL (ref 150–400)
RBC: 2.71 MIL/uL — ABNORMAL LOW (ref 4.22–5.81)
RDW: 15.7 % — ABNORMAL HIGH (ref 11.5–15.5)
WBC: 9 10*3/uL (ref 4.0–10.5)
nRBC: 0 % (ref 0.0–0.2)

## 2019-10-08 LAB — MAGNESIUM: Magnesium: 2.1 mg/dL (ref 1.7–2.4)

## 2019-10-08 LAB — RENAL FUNCTION PANEL
Albumin: 2 g/dL — ABNORMAL LOW (ref 3.5–5.0)
Anion gap: 11 (ref 5–15)
BUN: 62 mg/dL — ABNORMAL HIGH (ref 6–20)
CO2: 20 mmol/L — ABNORMAL LOW (ref 22–32)
Calcium: 7.8 mg/dL — ABNORMAL LOW (ref 8.9–10.3)
Chloride: 114 mmol/L — ABNORMAL HIGH (ref 98–111)
Creatinine, Ser: 2.59 mg/dL — ABNORMAL HIGH (ref 0.61–1.24)
GFR calc Af Amer: 30 mL/min — ABNORMAL LOW (ref >60)
GFR calc non Af Amer: 26 mL/min — ABNORMAL LOW (ref >60)
Glucose, Bld: 100 mg/dL — ABNORMAL HIGH (ref 70–99)
Phosphorus: 3.4 mg/dL (ref 2.5–4.6)
Potassium: 3.3 mmol/L — ABNORMAL LOW (ref 3.5–5.1)
Sodium: 145 mmol/L (ref 135–145)

## 2019-10-08 LAB — HEMOGLOBIN AND HEMATOCRIT, BLOOD
HCT: 29.8 % — ABNORMAL LOW (ref 39.0–52.0)
Hemoglobin: 8.9 g/dL — ABNORMAL LOW (ref 13.0–17.0)

## 2019-10-08 LAB — PREALBUMIN: Prealbumin: 7.6 mg/dL — ABNORMAL LOW (ref 18–38)

## 2019-10-08 MED ORDER — POTASSIUM CHLORIDE 10 MEQ/100ML IV SOLN
10.0000 meq | INTRAVENOUS | Status: DC
  Filled 2019-10-08: qty 100

## 2019-10-08 MED ORDER — PANTOPRAZOLE SODIUM 40 MG IV SOLR
40.0000 mg | INTRAVENOUS | Status: DC
  Administered 2019-10-08 – 2019-10-10 (×3): 40 mg via INTRAVENOUS
  Filled 2019-10-08 (×3): qty 40

## 2019-10-08 MED ORDER — GUAIFENESIN-DM 100-10 MG/5ML PO SYRP
5.0000 mL | ORAL_SOLUTION | ORAL | Status: DC | PRN
  Administered 2019-10-08: 5 mL via ORAL
  Filled 2019-10-08: qty 10

## 2019-10-08 MED ORDER — POTASSIUM CHLORIDE 10 MEQ/100ML IV SOLN
10.0000 meq | INTRAVENOUS | Status: AC
  Administered 2019-10-08 (×4): 10 meq via INTRAVENOUS
  Filled 2019-10-08 (×3): qty 100

## 2019-10-08 MED ORDER — LORAZEPAM 2 MG/ML IJ SOLN
0.2500 mg | Freq: Every day | INTRAMUSCULAR | Status: DC
  Administered 2019-10-08: 0.25 mg via INTRAVENOUS
  Filled 2019-10-08: qty 1

## 2019-10-08 NOTE — Progress Notes (Signed)
Advanced Heart Failure Rounding Note  PCP-Cardiologist: [name], MD  [facility]: Dr. Haroldine Laws   Subjective:    Admitted w/ SBO. S/p exploratory laparotomy 6/14 w/ lysis of adhesions.    Remains w/ NGT. NPO. PAH meds and diuretic on hold.   Feeling better today. Still not passing gas however. Denies SOB. Starting to ambulate.   Creatinine improved   Objective:   Weight Range: 50 kg Body mass index is 16.76 kg/m.   Vital Signs:   Temp:  [98.2 F (36.8 C)-98.3 F (36.8 C)] 98.2 F (36.8 C) (06/16 1430) Pulse Rate:  [81-96] 96 (06/16 1430) Resp:  [15-16] 15 (06/16 1430) BP: (132-149)/(70-83) 132/76 (06/16 1430) SpO2:  [98 %-100 %] 98 % (06/16 1430) Last BM Date: 10/06/19  Weight change: Filed Weights   10/03/19 0434 10/06/19 0512 10/06/19 0833  Weight: 54.7 kg 50 kg 50 kg    Intake/Output:   Intake/Output Summary (Last 24 hours) at 10/08/2019 1708 Last data filed at 10/07/2019 1834 Gross per 24 hour  Intake 50 ml  Output 600 ml  Net -550 ml      Physical Exam    General:  cachetic/ chronically ill AAM w/ sclerodermic features. No resp difficulty HEENT: normal + NGT Neck: supple. no JVD. Carotids 2+ bilat; no bruits. No lymphadenopathy or thryomegaly appreciated. Cor: PMI nondisplaced. Regular rate & rhythm. No rubs, gallops or murmurs. Lungs: clear Abdomen: soft, nontender, nondistended. No hepatosplenomegaly. Wound ok. No BS yet Extremities: no cyanosis, clubbing, rash, edema. Scleroderma changes Neuro: alert & orientedx3, cranial nerves grossly intact. moves all 4 extremities w/o difficulty. Affect pleasant   Labs    CBC Recent Labs    10/07/19 0334 10/07/19 0334 10/08/19 0829 10/08/19 1635  WBC 10.6*  --  9.0  --   HGB 9.7*   < > 8.3* 8.9*  HCT 32.5*   < > 27.5* 29.8*  MCV 101.6*  --  101.5*  --   PLT 189  --  177  --    < > = values in this interval not displayed.   Basic Metabolic Panel Recent Labs    10/07/19 0334 10/08/19 0829    NA 145 145  K 3.9 3.3*  CL 113* 114*  CO2 19* 20*  GLUCOSE 132* 100*  BUN 78* 62*  CREATININE 3.39* 2.59*  CALCIUM 7.9* 7.8*  MG 2.3 2.1  PHOS 6.2* 3.4   Liver Function Tests Recent Labs    10/07/19 0334 10/08/19 0829  ALBUMIN 2.1* 2.0*   No results for input(s): LIPASE, AMYLASE in the last 72 hours. Cardiac Enzymes No results for input(s): CKTOTAL, CKMB, CKMBINDEX, TROPONINI in the last 72 hours.  BNP: BNP (last 3 results) Recent Labs    05/12/19 1026 06/11/19 1204 09/28/19 1310  BNP 297.0* 197.3* 184.7*    ProBNP (last 3 results) No results for input(s): PROBNP in the last 8760 hours.   D-Dimer No results for input(s): DDIMER in the last 72 hours. Hemoglobin A1C No results for input(s): HGBA1C in the last 72 hours. Fasting Lipid Panel No results for input(s): CHOL, HDL, LDLCALC, TRIG, CHOLHDL, LDLDIRECT in the last 72 hours. Thyroid Function Tests No results for input(s): TSH, T4TOTAL, T3FREE, THYROIDAB in the last 72 hours.  Invalid input(s): FREET3  Other results:   Imaging    [place] EKG SITE RITE  Result Date: 10/08/2019 If Site Rite image not attached, placement could not be confirmed due to current cardiac rhythm.    Medications:  Scheduled Medications: . Chlorhexidine Gluconate Cloth  6 each Topical Daily  . heparin  5,000 Units Subcutaneous Q8H  . LORazepam  0.25 mg Intravenous QHS  . pantoprazole (PROTONIX) IV  40 mg Intravenous Q24H  . sodium chloride flush  3 mL Intravenous Once    Infusions: . dextrose 5 % and 0.45% NaCl 125 mL/hr at 10/08/19 1133  . methocarbamol (ROBAXIN) IV 500 mg (10/08/19 1346)    PRN Medications: bisacodyl, guaiFENesin-dextromethorphan, HYDROmorphone (DILAUDID) injection, labetalol, ondansetron **OR** ondansetron (ZOFRAN) IV, phenol, prochlorperazine  Assessment/Plan    1. SBO - s/p exploratory laparotomy yesterday w/ lysis of adhesions. - NPO + NGT. Management per general surgery  - Improving  slowly  2. AKI on CKD IIIb - SCr peaked to 7.32 (baseline ~2.5) - 2/2 volume depletion/dehydration from SBO  - improving w/ IVFs. SCr down to 2.59 today (which is his baseline) - nephrology following  3. 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 PA 81/26 (48), PCW 17, PVC 6.0 - Echo 02/03/19 showed moderately elevatedright ventricular systolic pressureat 45.4 mmHg. - Echo 1/202: right ventricular systolic pressure 09.8 mmHg. RV normal. LVEF 55%.  - on  sildenafil  40 mg TID PTA - on opsumit 10 mg daily PTA - meds on hold for SBO. Currently NPO  - home diuretic, torsemide, also on hold for AKI/ dehydration  - no change  4. CAD - s/p previous RCA stent. - LHC 07/01/18: Non obstructive CAD with patent RCA stent. - Tolerated surgery well. No s/s ischemia - Off ASA with GIB.  - statin on hold (NPO status)  5. Scleroderma - Follows with Dr. Estanislado Pandy  6.  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 Nilda Simmer developed large recurrent pericardial effusion w/o tamponade and moderate right sided pleural effusion requiring repeat intervention - s/p right VATSdrainage of his pleural effusion and pericardial window10/19/20. - Fluid analysis c/w transudative effusion  - Repeat echo in 1/21 showed only small effusion    Length of Stay: [place], MD  10/08/2019, 5:08 PM  Advanced Heart Failure Team Pager [phone] (M-F; 7a - 4p)  Please contact [facility] for night-coverage after hours (4p -7a ) and weekends on [email or web address]

## 2019-10-08 NOTE — Progress Notes (Addendum)
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 Adin Hector.  Baseline creatinine 2.5 mg/dL.  Renal ultrasound revealed no evidence of obstruction.  Maintenance IV fluids have been given 1.5 L normal saline bolus administered as patient appears to be dry and cachectic.  Status post lysis of adhesions with laparotomy 10/06/2019  Blood pressure 135/76 pulse 92 temperature 98 O2 sats 95%  Sodium 145 potassium 3.9 chloride 113 CO2 19 BUN 78 creatinine 3.39 glucose 132 calcium 7.9 phosphorus 6.2 magnesium 2.3 albumin 2.1 hemoglobin 9.7  IV D5 1/2 normal saline 125 cc an hour   Objective:  Vital signs in last 24 hours:  Temp:  [98.2 F (36.8 C)-98.3 F (36.8 C)] 98.3 F (36.8 C) (06/16 0344) Pulse Rate:  [81-95] 86 (06/16 0344) Resp:  [15-16] 16 (06/16 0344) BP: (133-149)/(70-83) 144/70 (06/16 0344) SpO2:  [97 %-100 %] 100 % (06/16 0344)  Weight change:  Filed Weights   10/03/19 0434 10/06/19 0512 10/06/19 0833  Weight: 54.7 kg 50 kg 50 kg    Intake/Output: I/O last 3 completed shifts: In: 1109.3 [I.V.:808.9; NG/GT:50; IV Piggyback:250.5] Out: 1000 [Urine:400; Emesis/NG output:600]   Intake/Output this shift:  No intake/output data recorded.  General: Cachectic older looking male lying on bed with NG tube in place Heart:RRR, s1s2 nl, no rubs Lungs: Clear b/l, no crackle Abdomen:soft, Non-tender, non-distended Extremities:No LE edema Neurology: Alert, awake and following commands.  No asterixis   Basic Metabolic Panel: Recent Labs  Lab 10/03/19 0451 10/03/19 0451 10/04/19 6384 10/04/19 5364 10/05/19 0833 10/06/19 0237 10/07/19 0334  NA 136  --  135  --  141 146* 145  K 3.3*  --  3.9  --  3.6 3.5 3.9  CL 93*   --  94*  --  108 112* 113*  CO2 27  --  23  --  21* 23 19*  GLUCOSE 103*  --  96  --  107* 113* 132*  BUN 94*  --  106*  --  100* 81* 78*  CREATININE 7.32*  --  7.58*  --  4.74* 3.43* 3.39*  CALCIUM 9.0   < > 8.4*   < > 8.2* 8.2* 7.9*  MG 2.8*  --  2.3  --  2.4 2.3 2.3  PHOS 7.0*  --  7.3*  --  6.2* 4.9* 6.2*   < > = values in this interval not displayed.    Liver Function Tests: Recent Labs  Lab 10/03/19 0451 10/04/19 6803 10/05/19 0833 10/06/19 0237 10/07/19 0334  ALBUMIN 4.1 3.4* 2.7* 2.5* 2.1*   No results for input(s): LIPASE, AMYLASE in the last 168 hours. No results for input(s): AMMONIA in the last 168 hours.  CBC: Recent Labs  Lab 10/02/19 0403 10/04/19 2122 10/05/19 0833 10/06/19 0237 10/07/19 0334  WBC 5.4 9.5 7.0 7.5 10.6*  HGB 10.0* 10.8* 9.5* 9.9* 9.7*  HCT 32.9* 34.4* 30.6* 31.9* 32.5*  MCV 99.4 97.2 98.7 98.8 101.6*  PLT 206 179 166 203 189    Cardiac Enzymes: No results for input(s): CKTOTAL, CKMB, CKMBINDEX, TROPONINI in the last 168 hours.  BNP: Invalid input(s): POCBNP  CBG: No results for input(s): GLUCAP in the last 168 hours.  Microbiology: Results for orders placed or performed during the hospital encounter of 09/28/19  SARS Coronavirus  2 by RT PCR (hospital order, performed in [facility] hospital lab) Nasopharyngeal Nasopharyngeal Swab     Status: None   Collection Time: 09/28/19  4:59 PM   Specimen: Nasopharyngeal Swab  Result Value Ref Range Status   SARS Coronavirus 2 NEGATIVE NEGATIVE Final    Comment: (NOTE) SARS-CoV-2 target nucleic acids are NOT DETECTED. The SARS-CoV-2 RNA is generally detectable in upper and lower respiratory specimens during the acute phase of infection. The lowest concentration of SARS-CoV-2 viral copies this assay can detect is 250 copies / mL. A negative result does not preclude SARS-CoV-2 infection and should not be used as the sole basis for treatment or other patient management decisions.  A  negative result may occur with improper specimen collection / handling, submission of specimen other than nasopharyngeal swab, presence of viral mutation(s) within the areas targeted by this assay, and inadequate number of viral copies (<250 copies / mL). A negative result must be combined with clinical observations, patient history, and epidemiological information. Fact Sheet for Patients:   StrictlyIdeas.no Fact Sheet for Healthcare Providers: BankingDealers.co.za This test is not yet approved or cleared  by the [place] FDA and has been authorized for detection and/or diagnosis of SARS-CoV-2 by FDA under an Emergency Use Authorization (EUA).  This EUA will remain in effect (meaning this test can be used) for the duration of the COVID-19 declaration under Section 564(b)(1) of the Act, 21 U.S.C. section 360bbb-3(b)(1), unless the authorization is terminated or revoked sooner. Performed at [facility], [place] [address] [place], [place] [address]   Surgical pcr screen     Status: None   Collection Time: 10/05/19 10:07 AM   Specimen: Nasal Mucosa; Nasal Swab  Result Value Ref Range Status   MRSA, PCR NEGATIVE NEGATIVE Final   Staphylococcus aureus NEGATIVE NEGATIVE Final    Comment: (NOTE) The Xpert SA Assay (FDA approved for NASAL specimens in patients 14 years of age and older), is one component of a comprehensive surveillance program. It is not intended to diagnose infection nor to guide or monitor treatment. Performed at [facility], [place] [address], [place], [place] [address]     Coagulation Studies: No results for input(s): LABPROT, INR in the last 72 hours.  Urinalysis: No results for input(s): COLORURINE, LABSPEC, PHURINE, GLUCOSEU, HGBUR, BILIRUBINUR, KETONESUR, PROTEINUR, UROBILINOGEN, NITRITE, LEUKOCYTESUR in the last 72 hours.  Invalid input(s): APPERANCEUR    Imaging: No results  found.   Medications:   . dextrose 5 % and 0.45% NaCl 125 mL/hr at 10/08/19 0304  . methocarbamol (ROBAXIN) IV 500 mg (10/08/19 0653)   . Chlorhexidine Gluconate Cloth  6 each Topical Daily  . heparin  5,000 Units Subcutaneous Q8H  . sodium chloride flush  3 mL Intravenous Once   bisacodyl, HYDROmorphone (DILAUDID) injection, labetalol, ondansetron **OR** ondansetron (ZOFRAN) IV, phenol, prochlorperazine  Assessment/ Plan:   Acute kidney injury setting of baseline chronic kidney disease stage IV.  Baseline creatinine appears about 2.5.  He has membranous nephropathy per biopsy.  Renal ultrasound does not reveal any evidence of hydronephrosis.  Patient appeared to be cachectic and dry on admission.  Received IV fluids.  He continues to receive 125 cc/h.  He continues to make urine with 400 cc out 10/07/2019.  NG tube output.  We will continue to follow as renal function slowly improves  Small bowel obstruction conservative management initially underwent exploratory laparotomy with lysis of adhesions 10/06/2018.  Hypertension/volume continue to follow blood pressures acceptable.  Continue IV fluids at 125 cc an hour D5 normal saline.  Anemia received IV iron and Aranesp.  Hypernatremia mild we will continue to follow  Hypokalemia appears to resolved.   LOS: [place] _0 _1 :01 AM

## 2019-10-08 NOTE — Progress Notes (Signed)
[facility] Progress Note  2 Days Post-Op  Subjective: No pain except for with coughing. No flatus. Wants some water to drink. Really wants wife to be able to stay here.   Objective: Vital signs in last 24 hours: Temp:  [98.2 F (36.8 C)-98.3 F (36.8 C)] 98.3 F (36.8 C) (06/16 0344) Pulse Rate:  [81-95] 86 (06/16 0344) Resp:  [15-16] 16 (06/16 0344) BP: (133-149)/(70-83) 144/70 (06/16 0344) SpO2:  [97 %-100 %] 100 % (06/16 0344) Last BM Date: 10/04/19  Intake/Output from previous day: 06/15 0701 - 06/16 0700 In: 1109.3 [I.V.:808.9; NG/GT:50; IV Piggyback:250.5] Out: 600 [Emesis/NG output:600] Intake/Output this shift: No intake/output data recorded.  PE: General: WD, cachectic male who is laying in bed in NAD Heart: regular, rate, and rhythm.  Normal s1,s2. No obvious murmurs, gallops, or rubs noted.  Palpable radial and pedal pulses bilaterally Lungs: CTAB, no wheezes, rhonchi, or rales noted.  Respiratory effort nonlabored Abd: soft, appropriately ttp, mildly distended, BS hypoactive, NGT in place with dark reddish-brown drainage, midline dressing with small amount bloody drainage   Lab Results:  Recent Labs    10/07/19 0334 10/08/19 0829  WBC 10.6* 9.0  HGB 9.7* 8.3*  HCT 32.5* 27.5*  PLT 189 177   BMET Recent Labs    10/07/19 0334 10/08/19 0829  NA 145 145  K 3.9 3.3*  CL 113* 114*  CO2 19* 20*  GLUCOSE 132* 100*  BUN 78* 62*  CREATININE 3.39* 2.59*  CALCIUM 7.9* 7.8*   PT/INR No results for input(s): LABPROT, INR in the last 72 hours. CMP     Component Value Date/Time   NA 145 10/08/2019 0829   NA 145 (H) 06/11/2018 0852   K 3.3 (L) 10/08/2019 0829   CL 114 (H) 10/08/2019 0829   CO2 20 (L) 10/08/2019 0829   GLUCOSE 100 (H) 10/08/2019 0829   BUN 62 (H) 10/08/2019 0829   BUN 34 (H) 06/11/2018 0852   CREATININE 2.59 (H) 10/08/2019 0829   CREATININE 2.01 (H) 03/23/2017 1527   CALCIUM 7.8 (L) 10/08/2019 0829   PROT 7.0 09/30/2019  0358   ALBUMIN 2.0 (L) 10/08/2019 0829   AST 15 09/30/2019 0358   ALT 12 09/30/2019 0358   ALKPHOS 67 09/30/2019 0358   BILITOT 0.4 09/30/2019 0358   GFRNONAA 26 (L) 10/08/2019 0829   GFRNONAA 36 (L) 03/23/2017 1527   GFRAA 30 (L) 10/08/2019 0829   GFRAA 41 (L) 03/23/2017 1527   Lipase     Component Value Date/Time   LIPASE 40 09/28/2019 1310       Studies/Results: No results found.  Anti-infectives: Anti-infectives (From admission, onward)   Start     Dose/Rate Route Frequency Ordered Stop   10/06/19 0745  ceFAZolin (ANCEF) IVPB 2g/100 mL premix        2 g 200 mL/hr over 30 Minutes Intravenous  Once 10/06/19 0735 10/06/19 1700   10/06/19 0600  cefoTEtan (CEFOTAN) 2 g in sodium chloride 0.9 % 100 mL IVPB        2 g 200 mL/hr over 30 Minutes Intravenous On call to O.R. 10/05/19 0810 10/07/19 0843       Assessment/Plan HTN HLD CAD  Hypothyroidism ARF- Cr2.59, improving and nephrology following Anion gap acidosis - Per TRH -  SBO S/p exploratory laparotomy and LOA 10/06/19 Dr. Donne Hazel - POD#2 - clamping trials today, await bowel function - PT/OT, mobilize - ok to have ice chips and sips with NGT clamped - check prealbumin today,  if low, will likely start TPN - talked to patient's wife at the bedside as well   FEN -NPO, IVF, NGT clamping trials today  VTE -SCDs, SQ heparin  ID -none Foley - d/c today   LOS: 10 days    Norm Parcel , [facility] 10/08/2019, 10:40 AM Please see Amion for pager number during day hours 7:00am-4:30pm

## 2019-10-08 NOTE — Progress Notes (Signed)
PROGRESS NOTE  Cody JENTZ LPN:[id number] DOB: 1960-12-08   PCP: [name], MD  Patient is from: Home.  DOA: 09/28/2019 LOS: 10  Brief Narrative / Interim history: 59 year old male with history of systolic CHF, scleroderma, HTN, CAD/PCI, HLD and malnutrition presenting with 2 days of nausea and vomiting and found to have small bowel obstruction.  He was managed conservatively with NG tube bowel decompression and IV fluid with improvement in symptoms.  NG tube discontinued on 10/01/2019.  He was cleared for discharge by general surgery from surgical standpoint. However, patient has significant emesis after NG tube removal.  General surgery recommended reinserting NGT.  Initially, patient refused NG tube replacement but he continued to have nausea and vomiting without bowel movement.  KUB without signs of SBO.  Attempted water enema without significant results continue to have intractable nausea and vomiting.  General surgery reconsulted on 6/11.  NG tube replaced.  CT abdomen and pelvis ordered.  Lumbar GI consulted as well for possible ileus/constipation.  Repeat CT abdomen and pelvis with persistent or recurrent SBO.  Cardiology consulted for preop eval in case he needs surgery.  Patient underwent ex lap with lysis of the adhesion by Dr. Donne Hazel on 10/06/2019.  Patient also developed worsening AKI with significant azotemia. Nephrology consulted.  Renal ultrasound without acute finding.  Received IV fluid boluses with increased maintenance rate.  Renal function improved.  Subjective: Seen and examined earlier this morning.  No major events overnight of this morning.  Somewhat irritable and confused partly out of frustration about prolonged hospitalization and NG tube.  No BM or flatus.  Continues to have significant output from NG tube.  He denies pain.  Reports productive cough with whitish phlegm.  Objective: Vitals:   10/07/19 0751 10/07/19 1354 10/07/19 1912 10/08/19 0344  BP:  135/76 133/74 (!) 149/83 (!) 144/70  Pulse: 92 95 81 86  Resp: _0 Temp: 98 F (36.7 C) 98.2 F (36.8 C) 98.2 F (36.8 C) 98.3 F (36.8 C)  TempSrc: Oral Oral Oral Oral  SpO2: 95% 97% 100% 100%  Weight:      Height:        Intake/Output Summary (Last 24 hours) at 10/08/2019 1401 Last data filed at 10/07/2019 1834 Gross per 24 hour  Intake 1109.33 ml  Output 600 ml  Net 509.33 ml   Filed Weights   10/03/19 0434 10/06/19 0512 10/06/19 0833  Weight: 54.7 kg 50 kg 50 kg    Examination:  GENERAL: Frail and chronically ill-appearing.  No apparent distress.  Nontoxic. HEENT: MMM.  NG tube to wall suction. NECK: Supple.  No apparent JVD.  RESP:  No IWOB.  Fair aeration bilaterally. CVS:  RRR. Heart sounds normal.  ABD/GI/GU: No bowel sounds.. Abd soft, NTND.  Honeycomb dressing over surgical wound DCI. MSK/EXT:  Moves extremities. Significant muscle mass and subcu fat loss. SKIN: Honeycomb dressing over surgical wound DCI. NEURO: Awake, alert and oriented fairly.  No apparent focal neuro deficit. PSYCH: Somewhat upset and confused.  Procedures:  6/6-6/9 NG tube for SBO 6/11-NG tube replaced. 6/14-exploratory laparotomy with lysis of adhesion by Dr. Donne Hazel  Microbiology summarized: COVID-19 PCR negative.  Assessment & Plan: Small bowel obstruction-repeat CT abdomen and pelvis on 6/11 with persistent or recurrent SBO. Intractable nausea and vomiting-resolved. -Failed conservative measures with NG tube bowel decompression. -Exploratory laparotomy by Dr. Donne Hazel on 6/14 -No bowel sounds or flatus yet. -Antiemetics and D51/2NS at 125 cc an hour -NGT clamping trial, PICC  line and TPN per surgery.  Prealbumin 7.6. -Mobilize/OOB/PT/OT.   AKI on CKD-3B with azotemia in patient with history of membranous nephropathy: Baseline Cr 2.7-3.0 > 3.05 (admit)>> 7.58> 2.59.  BUN 51 >> 106>>62.  About 1.0 L UOP/24 hrs.  Renal US without acute finding.   -IV fluid per  nephrology -Avoid nephrotoxic meds -Continue monitoring.  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  Hypernatremia: Na 149>> 141> 145.  -IV fluid as above.  Hypokalemia/hypomagnesemia: Likely due to GI loss.  K3.3. -Monitor and replenish as appropriate -IV KCl 40 mEq ordered.  Metabolic acidosis: Likely due to AKI and IV fluid.  Stable -Continue monitoring -Nephrology on board  Fever: Spiked fever to 101.4 on 6/11, then 100.4 on 6/13.  Afebrile since then.No obvious source of infection.  No leukocytosis.  -Further work-up if he spikes fever again.  Chronic diastolic CHF: Echo on 07/31/8117 with EF of 55%, G2-DD and RVSP of 33.62mHg.  He is probably hypovolemic from GI loss.  No cardiopulmonary symptoms.  Net negative.  Followed by Dr. BHaroldine Lawsoutpatient. -Cardiology following. -Monitor fluid status while on IV fluid -Hold home diuretics  Pulmonary hypertension: Echo as above. RHC in 06/2018 with PA of 81/26 and PCWP of 17 -Resume home Opsumit and Revatio once he started tolerating p.o.  Scleroderma-stable.  Followed by Dr. DEstanislado Pandy-Continue home Norvasc, sildenafil  History of CAD s/p PCA-no anginal symptoms. 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  Dropped 1 g overnight. -Recheck H&H this afternoon -Continue monitoring.  Productive cough: No fever, chest pain or shortness of breath.  Could be from postnasal drainage in the setting of NGT -Mucolytic's and antitussive  Delirium -Reorientation and delirium precautions.  Debility/physical deconditioning due to acute illness and hospitalization -PT/OT eval  Severe malnutrition:Body mass index is 16.76 kg/m.  Significant muscle mass and subcu fat loss.  Prealbumin 7.6. -Plan for PICC line and TPN today. Nutrition Problem: Severe  Malnutrition Etiology: chronic illness (CHF, scleroderma) Signs/Symptoms: moderate fat depletion, severe fat depletion, moderate muscle depletion, severe muscle depletion Interventions: Ensure Enlive (each supplement provides 350kcal and 20 grams of protein), Magic cup, MVI   DVT prophylaxis: Subcu heparin Code Status: Full code Family Communication: Updated patient's wife at the bedside Status is: Inpatient  Remains inpatient appropriate because:IV treatments appropriate due to intensity of illness or inability to take PO, Inpatient level of care appropriate due to severity of illness and Ongoing emesis   Dispo: The patient is from: Home              Anticipated d/c is to: Home              Anticipated d/c date is: > 3 days              Patient currently is not medically stable to d/c.       Consultants:  General surgery Gastroenterology Nephrology Cardiology  Sch Meds:  Scheduled Meds: . Chlorhexidine Gluconate Cloth  6 each Topical Daily  . heparin  5,000 Units Subcutaneous Q8H  . LORazepam  0.25 mg Intravenous QHS  . pantoprazole (PROTONIX) IV  40 mg Intravenous Q24H  . sodium chloride flush  3 mL Intravenous Once   Continuous Infusions: . dextrose 5 % and 0.45% NaCl 125 mL/hr at 10/08/19 1133  . methocarbamol (ROBAXIN) IV  500 mg (10/08/19 1346)  . potassium chloride 10 mEq (10/08/19 1342)   PRN Meds:.bisacodyl, guaiFENesin-dextromethorphan, HYDROmorphone (DILAUDID) injection, labetalol, ondansetron **OR** ondansetron (ZOFRAN) IV, phenol, prochlorperazine  Antimicrobials: Anti-infectives (From admission, onward)   Start     Dose/Rate Route Frequency Ordered Stop   10/06/19 0745  ceFAZolin (ANCEF) IVPB 2g/100 mL premix        2 g 200 mL/hr over 30 Minutes Intravenous  Once 10/06/19 0735 10/06/19 1700   10/06/19 0600  cefoTEtan (CEFOTAN) 2 g in sodium chloride 0.9 % 100 mL IVPB        2 g 200 mL/hr over 30 Minutes Intravenous On call to O.R. 10/05/19 0810  10/07/19 0843       I have personally reviewed the following labs and images: CBC: Recent Labs  Lab 10/04/19 0633 10/05/19 0833 10/06/19 0237 10/07/19 0334 10/08/19 0829  WBC 9.5 7.0 7.5 10.6* 9.0  HGB 10.8* 9.5* 9.9* 9.7* 8.3*  HCT 34.4* 30.6* 31.9* 32.5* 27.5*  MCV 97.2 98.7 98.8 101.6* 101.5*  PLT 179 166 203 189 177   BMP &GFR Recent Labs  Lab 10/04/19 0633 10/05/19 0833 10/06/19 0237 10/07/19 0334 10/08/19 0829  NA 135 141 146* 145 145  K 3.9 3.6 3.5 3.9 3.3*  CL 94* 108 112* 113* 114*  CO2 23 21* 23 19* 20*  GLUCOSE 96 107* 113* 132* 100*  BUN 106* 100* 81* 78* 62*  CREATININE 7.58* 4.74* 3.43* 3.39* 2.59*  CALCIUM 8.4* 8.2* 8.2* 7.9* 7.8*  MG 2.3 2.4 2.3 2.3 2.1  PHOS 7.3* 6.2* 4.9* 6.2* 3.4   Estimated Creatinine Clearance: 21.5 mL/min (A) (by C-G formula based on SCr of 2.59 mg/dL (H)). Liver & Pancreas: Recent Labs  Lab 10/04/19 3888 10/05/19 0833 10/06/19 0237 10/07/19 0334 10/08/19 0829  ALBUMIN 3.4* 2.7* 2.5* 2.1* 2.0*   No results for input(s): LIPASE, AMYLASE in the last 168 hours. No results for input(s): AMMONIA in the last 168 hours. Diabetic: No results for input(s): HGBA1C in the last 72 hours. No results for input(s): GLUCAP in the last 168 hours. Cardiac Enzymes: No results for input(s): CKTOTAL, CKMB, CKMBINDEX, TROPONINI in the last 168 hours. No results for input(s): PROBNP in the last 8760 hours. Coagulation Profile: No results for input(s): INR, PROTIME in the last 168 hours. Thyroid Function Tests: No results for input(s): TSH, T4TOTAL, FREET4, T3FREE, THYROIDAB in the last 72 hours. Lipid Profile: No results for input(s): CHOL, HDL, LDLCALC, TRIG, CHOLHDL, LDLDIRECT in the last 72 hours. Anemia Panel: No results for input(s): VITAMINB12, FOLATE, FERRITIN, TIBC, IRON, RETICCTPCT in the last 72 hours. Urine analysis:    Component Value Date/Time   COLORURINE YELLOW 10/03/2019 2300   APPEARANCEUR HAZY (A) 10/03/2019 2300    LABSPEC 1.016 10/03/2019 2300   PHURINE 5.0 10/03/2019 2300   GLUCOSEU NEGATIVE 10/03/2019 2300   HGBUR NEGATIVE 10/03/2019 2300   BILIRUBINUR SMALL (A) 10/03/2019 2300   KETONESUR NEGATIVE 10/03/2019 2300   PROTEINUR 100 (A) 10/03/2019 2300   NITRITE NEGATIVE 10/03/2019 2300   LEUKOCYTESUR NEGATIVE 10/03/2019 2300   Sepsis Labs: Invalid input(s): PROCALCITONIN, [place]  Microbiology: Recent Results (from the past 240 hour(s))  SARS Coronavirus 2 by RT PCR (hospital order, performed in [facility] hospital lab) Nasopharyngeal Nasopharyngeal Swab     Status: None   Collection Time: 09/28/19  4:59 PM   Specimen: Nasopharyngeal Swab  Result Value Ref Range Status   SARS Coronavirus 2 NEGATIVE NEGATIVE Final    Comment: (NOTE) SARS-CoV-2 target  nucleic acids are NOT DETECTED. The SARS-CoV-2 RNA is generally detectable in upper and lower respiratory specimens during the acute phase of infection. The lowest concentration of SARS-CoV-2 viral copies this assay can detect is 250 copies / mL. A negative result does not preclude SARS-CoV-2 infection and should not be used as the sole basis for treatment or other patient management decisions.  A negative result may occur with improper specimen collection / handling, submission of specimen other than nasopharyngeal swab, presence of viral mutation(s) within the areas targeted by this assay, and inadequate number of viral copies (<250 copies / mL). A negative result must be combined with clinical observations, patient history, and epidemiological information. Fact Sheet for Patients:   StrictlyIdeas.no Fact Sheet for Healthcare Providers: BankingDealers.co.za This test is not yet approved or cleared  by the [place] FDA and has been authorized for detection and/or diagnosis of SARS-CoV-2 by FDA under an Emergency Use Authorization (EUA).  This EUA will remain in effect (meaning this  test can be used) for the duration of the COVID-19 declaration under Section 564(b)(1) of the Act, 21 U.S.C. section 360bbb-3(b)(1), unless the authorization is terminated or revoked sooner. Performed at [facility], [place] [address] [place], [place] [address]   Surgical pcr screen     Status: None   Collection Time: 10/05/19 10:07 AM   Specimen: Nasal Mucosa; Nasal Swab  Result Value Ref Range Status   MRSA, PCR NEGATIVE NEGATIVE Final   Staphylococcus aureus NEGATIVE NEGATIVE Final    Comment: (NOTE) The Xpert SA Assay (FDA approved for NASAL specimens in patients 60 years of age and older), is one component of a comprehensive surveillance program. It is not intended to diagnose infection nor to guide or monitor treatment. Performed at [facility], [place] [address], [place], [place] [address]     Radiology Studies: [place] EKG SITE RITE  Result Date: 10/08/2019 If Site Rite image not attached, placement could not be confirmed due to current cardiac rhythm.     Ziad Maye T. Nitro  If 7PM-7AM, please contact night-coverage [email or web address] Password [facility] 10/08/2019, 2:01 PM

## 2019-10-08 NOTE — Plan of Care (Signed)

## 2019-10-08 NOTE — Progress Notes (Signed)
Physical Therapy Treatment Patient Details Name: [name] MRN: [id number] DOB: [birth year] Today's Date: 10/08/2019    History of Present Illness Pt is a 59 year old man admitted on 09/28/19 with N/V due to SBO. NGT placed, removed 10/01/19 with resumption of vomiting. Hospital course complicated by mild hypernatremia and AKI. s/p exploratory laparatomy with lysis of adhersions 6/14. PMH: CHF, scleroderma, HTN CAD/PCI, malnutrition.    PT Comments    Pt progressing towards goals. Only agreeable to supine HEP this session as pt reports he had already been up out of bed. Pt requiring rest break during HEP as he fatigues easily. Pt educated about using pillow for bracing for pain management. Current recommendations appropriate. Will continue to follow acutely.      Follow Up Recommendations  Home health PT;Supervision for mobility/OOB     Equipment Recommendations  Other (comment) (TBD)    Recommendations for Other Services       Precautions / Restrictions Precautions Precautions: Fall Precaution Comments: ng tube Restrictions Weight Bearing Restrictions: No    Mobility  Bed Mobility               General bed mobility comments: Refusing OOB as he had already been up.   Transfers                    Ambulation/Gait                 Stairs             Wheelchair Mobility    Modified Rankin (Stroke Patients Only)       Balance                                            Cognition Arousal/Alertness: Awake/alert Behavior During Therapy: Flat affect Overall Cognitive Status: Within Functional Limits for tasks assessed                                 General Comments: Improved cognition noted during session. Was agreeable to HEP this session.       Exercises General Exercises - Lower Extremity Ankle Circles/Pumps: AROM;Both;10 reps;Supine Quad Sets: AROM;Both;10 reps;Supine Heel Slides: AROM;Both;10  reps;Supine Hip ABduction/ADduction: AROM;Both;Supine;10 reps    General Comments General comments (skin integrity, edema, etc.): Required rest breaks during HEP. Educated about using pillow for bracing to help with abdominal pain       Pertinent Vitals/Pain Pain Assessment: Faces Faces Pain Scale: Hurts little more Pain Location: abdomen when coughing Pain Descriptors / Indicators: Operative site guarding;Sore;Guarding Pain Intervention(s): Limited activity within patient's tolerance;Monitored during session;Repositioned    Home Living                      Prior Function            PT Goals (current goals can now be found in the care plan section) Acute Rehab PT Goals Patient Stated Goal: get this tube out of my nose PT Goal Formulation: With patient Time For Goal Achievement: 10/21/19 Potential to Achieve Goals: Fair Progress towards PT goals: Progressing toward goals    Frequency    Min 3X/week      PT Plan Current plan remains appropriate    Co-evaluation  AM-PAC PT "6 Clicks" Mobility   Outcome Measure  Help needed turning from your back to your side while in a flat bed without using bedrails?: None Help needed moving from lying on your back to sitting on the side of a flat bed without using bedrails?: A Little Help needed moving to and from a bed to a chair (including a wheelchair)?: A Little Help needed standing up from a chair using your arms (e.g., wheelchair or bedside chair)?: A Little Help needed to walk in hospital room?: A Little Help needed climbing 3-5 steps with a railing? : A Lot 6 Click Score: 18    End of Session   Activity Tolerance: Patient limited by fatigue Patient left: in bed;with call bell/phone within reach;with family/visitor present Nurse Communication: Mobility status PT Visit Diagnosis: Unsteadiness on feet (R26.81);Pain;Difficulty in walking, not elsewhere classified (R26.2) Pain - part of body:   (abdomen)     Time: 1720-1739 PT Time Calculation (min) (ACUTE ONLY): 19 min  Charges:  $Therapeutic Exercise: 8-22 mins                     Lou Miner, DPT  Acute Rehabilitation Services  Pager: [phone] Office: [phone]    Rudean Hitt 10/08/2019, 6:37 PM

## 2019-10-08 NOTE — Progress Notes (Signed)
Pt pulled out NG tube and only working IV. Pt is not in any distress, somewhat confused. No drainage at this time. Paged [facility] to await orders.

## 2019-10-08 NOTE — Progress Notes (Signed)
PHARMACY - TOTAL PARENTERAL NUTRITION CONSULT NOTE   Indication: Prolonged ileus  Patient Measurements: Height: 5' 7.99" (172.7 cm) Weight: 50 kg (110 lb 3.7 oz) IBW/kg (Calculated) : 68.38 TPN AdjBW (KG): 50 Body mass index is 16.76 kg/m. Usual Weight:   Assessment: 37 YOM who presented on 6/6 with N/V and found to have SBO, NGT placed to suction. Pt w/ BM on 6/9, NGT removed, and diet advanced. The patient was noted to have recurrent N/V and distention so NGT replaced on 6/11- did not improve so pt underwent ex-lap with LOA on 6/14. Given prolonged NPO and poor po intake, pharmacy now consulted to start TPN while awaiting bowel recovery.  Given the timing of request for TPN today - this will be started tomorrow. Noted plans for PICC placement today.   Plan:  Will plan to initiate TPN tomorrow Will order TPN labs  Thank you for allowing pharmacy to be a part of this patient's care.  Alycia Rossetti, PharmD, BCPS Clinical Pharmacist 10/08/2019 3:09 PM   **Pharmacist phone directory can now be found on [email or web address] ([email or web address]).  Listed under [place].

## 2019-10-09 LAB — DIFFERENTIAL
Abs Immature Granulocytes: 0.07 10*3/uL (ref 0.00–0.07)
Basophils Absolute: 0 10*3/uL (ref 0.0–0.1)
Basophils Relative: 0 %
Eosinophils Absolute: 0.3 10*3/uL (ref 0.0–0.5)
Eosinophils Relative: 3 %
Immature Granulocytes: 1 %
Lymphocytes Relative: 10 %
Lymphs Abs: 0.9 10*3/uL (ref 0.7–4.0)
Monocytes Absolute: 0.6 10*3/uL (ref 0.1–1.0)
Monocytes Relative: 6 %
Neutro Abs: 7.6 10*3/uL (ref 1.7–7.7)
Neutrophils Relative %: 80 %

## 2019-10-09 LAB — COMPREHENSIVE METABOLIC PANEL
ALT: 9 U/L (ref 0–44)
AST: 20 U/L (ref 15–41)
Albumin: 2.1 g/dL — ABNORMAL LOW (ref 3.5–5.0)
Alkaline Phosphatase: 70 U/L (ref 38–126)
Anion gap: 8 (ref 5–15)
BUN: 48 mg/dL — ABNORMAL HIGH (ref 6–20)
CO2: 18 mmol/L — ABNORMAL LOW (ref 22–32)
Calcium: 7.8 mg/dL — ABNORMAL LOW (ref 8.9–10.3)
Chloride: 119 mmol/L — ABNORMAL HIGH (ref 98–111)
Creatinine, Ser: 2.25 mg/dL — ABNORMAL HIGH (ref 0.61–1.24)
GFR calc Af Amer: 35 mL/min — ABNORMAL LOW (ref >60)
GFR calc non Af Amer: 31 mL/min — ABNORMAL LOW (ref >60)
Glucose, Bld: 76 mg/dL (ref 70–99)
Potassium: 3.6 mmol/L (ref 3.5–5.1)
Sodium: 145 mmol/L (ref 135–145)
Total Bilirubin: 0.5 mg/dL (ref 0.3–1.2)
Total Protein: 5.2 g/dL — ABNORMAL LOW (ref 6.5–8.1)

## 2019-10-09 LAB — HEMOGLOBIN AND HEMATOCRIT, BLOOD
HCT: 28.9 % — ABNORMAL LOW (ref 39.0–52.0)
Hemoglobin: 8.7 g/dL — ABNORMAL LOW (ref 13.0–17.0)

## 2019-10-09 LAB — PHOSPHORUS: Phosphorus: 2.4 mg/dL — ABNORMAL LOW (ref 2.5–4.6)

## 2019-10-09 LAB — MAGNESIUM: Magnesium: 1.8 mg/dL (ref 1.7–2.4)

## 2019-10-09 MED ORDER — ALLOPURINOL 100 MG PO TABS
100.0000 mg | ORAL_TABLET | Freq: Every day | ORAL | Status: DC
  Administered 2019-10-09 – 2019-10-12 (×4): 100 mg via ORAL
  Filled 2019-10-09 (×5): qty 1

## 2019-10-09 MED ORDER — STERILE WATER FOR INJECTION IV SOLN
INTRAVENOUS | Status: DC
  Filled 2019-10-09: qty 850

## 2019-10-09 MED ORDER — TRAVASOL 10 % IV SOLN: INTRAVENOUS | Status: DC

## 2019-10-09 MED ORDER — MAGNESIUM SULFATE IN D5W 1-5 GM/100ML-% IV SOLN
1.0000 g | Freq: Once | INTRAVENOUS | Status: AC
  Administered 2019-10-09: 1 g via INTRAVENOUS
  Filled 2019-10-09: qty 100

## 2019-10-09 MED ORDER — SILDENAFIL CITRATE 20 MG PO TABS
40.0000 mg | ORAL_TABLET | Freq: Three times a day (TID) | ORAL | Status: DC
  Administered 2019-10-09 – 2019-10-12 (×10): 40 mg via ORAL
  Filled 2019-10-09 (×13): qty 2

## 2019-10-09 MED ORDER — AMLODIPINE BESYLATE 5 MG PO TABS
5.0000 mg | ORAL_TABLET | Freq: Every day | ORAL | Status: DC
  Administered 2019-10-09 – 2019-10-12 (×4): 5 mg via ORAL
  Filled 2019-10-09 (×4): qty 1

## 2019-10-09 MED ORDER — STERILE WATER FOR INJECTION IV SOLN
INTRAVENOUS | Status: DC
  Filled 2019-10-09 (×2): qty 850

## 2019-10-09 MED ORDER — POTASSIUM CHLORIDE 10 MEQ/100ML IV SOLN
10.0000 meq | INTRAVENOUS | Status: AC
  Administered 2019-10-09 (×2): 10 meq via INTRAVENOUS
  Filled 2019-10-09 (×2): qty 100

## 2019-10-09 MED ORDER — ADULT MULTIVITAMIN W/MINERALS CH
1.0000 | ORAL_TABLET | Freq: Every day | ORAL | Status: DC
  Administered 2019-10-09 – 2019-10-12 (×4): 1 via ORAL
  Filled 2019-10-09 (×4): qty 1

## 2019-10-09 MED ORDER — INSULIN ASPART 100 UNIT/ML ~~LOC~~ SOLN: 0.0000 [IU] | Freq: Four times a day (QID) | SUBCUTANEOUS | Status: DC

## 2019-10-09 MED ORDER — ESCITALOPRAM OXALATE 10 MG PO TABS
5.0000 mg | ORAL_TABLET | Freq: Every day | ORAL | Status: DC
  Administered 2019-10-10 – 2019-10-12 (×3): 5 mg via ORAL
  Filled 2019-10-09 (×4): qty 1

## 2019-10-09 MED ORDER — BOOST / RESOURCE BREEZE PO LIQD CUSTOM
1.0000 | Freq: Three times a day (TID) | ORAL | Status: DC
  Administered 2019-10-09 – 2019-10-10 (×4): 1 via ORAL

## 2019-10-09 MED ORDER — SIMVASTATIN 20 MG PO TABS
40.0000 mg | ORAL_TABLET | Freq: Every day | ORAL | Status: DC
  Administered 2019-10-09 – 2019-10-12 (×4): 40 mg via ORAL
  Filled 2019-10-09 (×4): qty 2

## 2019-10-09 MED ORDER — MACITENTAN 10 MG PO TABS
10.0000 mg | ORAL_TABLET | Freq: Every day | ORAL | Status: DC
  Administered 2019-10-09 – 2019-10-12 (×4): 10 mg via ORAL
  Filled 2019-10-09 (×4): qty 1

## 2019-10-09 NOTE — Plan of Care (Signed)

## 2019-10-09 NOTE — Progress Notes (Signed)
Pt refusing to have NG tube reinserted. Communicated this concern with [facility].

## 2019-10-09 NOTE — Progress Notes (Signed)
Patient ID: [name], male   DOB: 29-Dec-1960, 59 y.o.   MRN: [id number]    3 Days Post-Op  Subjective: No new complaints.  Pulled his NGT out overnight.  No nausea.  No flatus.  Up in chair yesterday and mobilized the day before.  ROS: See above, otherwise other systems negative  Objective: Vital signs in last 24 hours: Temp:  [98.2 F (36.8 C)-99.4 F (37.4 C)] 98.7 F (37.1 C) (06/17 0751) Pulse Rate:  [86-96] 93 (06/17 0751) Resp:  [15-18] 17 (06/17 0751) BP: (132-164)/(76-86) 164/85 (06/17 0751) SpO2:  [96 %-98 %] 97 % (06/17 0751) Weight:  [54.7 kg] 54.7 kg (06/17 0421) Last BM Date: 10/04/19  Intake/Output from previous day: 06/16 0701 - 06/17 0700 In: 0  Out: 800 [Urine:800] Intake/Output this shift: No intake/output data recorded.  PE: Abd: soft, mild bloating, absent BS, incision c/d/i with staples present  Lab Results:  Recent Labs    10/07/19 0334 10/07/19 0334 10/08/19 0829 10/08/19 0829 10/08/19 1635 10/09/19 0450  WBC 10.6*  --  9.0  --   --   --   HGB 9.7*   < > 8.3*   < > 8.9* 8.7*  HCT 32.5*   < > 27.5*   < > 29.8* 28.9*  PLT 189  --  177  --   --   --    < > = values in this interval not displayed.   BMET Recent Labs    10/08/19 0829 10/09/19 0450  NA 145 145  K 3.3* 3.6  CL 114* 119*  CO2 20* 18*  GLUCOSE 100* 76  BUN 62* 48*  CREATININE 2.59* 2.25*  CALCIUM 7.8* 7.8*   PT/INR No results for input(s): LABPROT, INR in the last 72 hours. CMP     Component Value Date/Time   NA 145 10/09/2019 0450   NA 145 (H) 06/11/2018 0852   K 3.6 10/09/2019 0450   CL 119 (H) 10/09/2019 0450   CO2 18 (L) 10/09/2019 0450   GLUCOSE 76 10/09/2019 0450   BUN 48 (H) 10/09/2019 0450   BUN 34 (H) 06/11/2018 0852   CREATININE 2.25 (H) 10/09/2019 0450   CREATININE 2.01 (H) 03/23/2017 1527   CALCIUM 7.8 (L) 10/09/2019 0450   PROT 5.2 (L) 10/09/2019 0450   ALBUMIN 2.1 (L) 10/09/2019 0450   AST 20 10/09/2019 0450   ALT 9 10/09/2019 0450    ALKPHOS 70 10/09/2019 0450   BILITOT 0.5 10/09/2019 0450   GFRNONAA 31 (L) 10/09/2019 0450   GFRNONAA 36 (L) 03/23/2017 1527   GFRAA 35 (L) 10/09/2019 0450   GFRAA 41 (L) 03/23/2017 1527   Lipase     Component Value Date/Time   LIPASE 40 09/28/2019 1310       Studies/Results: [place] EKG SITE RITE  Result Date: 10/08/2019 If Site Rite image not attached, placement could not be confirmed due to current cardiac rhythm.   Anti-infectives: Anti-infectives (From admission, onward)   Start     Dose/Rate Route Frequency Ordered Stop   10/06/19 0745  ceFAZolin (ANCEF) IVPB 2g/100 mL premix        2 g 200 mL/hr over 30 Minutes Intravenous  Once 10/06/19 0735 10/06/19 1700   10/06/19 0600  cefoTEtan (CEFOTAN) 2 g in sodium chloride 0.9 % 100 mL IVPB        2 g 200 mL/hr over 30 Minutes Intravenous On call to O.R. 10/05/19 8466 10/07/19 0843       Assessment/Plan HTN  HLD CAD  Hypothyroidism ARF- Cr2.59, improving and nephrology following Anion gap acidosis - Per TRH - PCM - prealbumin 7.6  TNA to start after line placed  SBO S/p exploratory laparotomy and LOA 10/06/19 Dr. Donne Hazel - POD#3 - NGT pulled out overnight, await bowel function, remain NPO for now - PT/OT, mobilize - ok to have ice chips  - will have IR place a tunneled picc for TNA access as we can't use his arms - talked to patient's wife at the bedside as well   FEN -NPO, IVF, TNA to start after line placement VTE -SCDs,SQheparin  ID -none Foley - out   LOS: 11 days    Henreitta Cea , [facility] Surgery 10/09/2019, 9:12 AM Please see Amion for pager number during day hours 7:00am-4:30pm or 7:00am -11:30am on weekends

## 2019-10-09 NOTE — Progress Notes (Signed)
Physical Therapy Treatment Patient Details Name: [name] MRN: [id number] DOB: Nov 02, 1960 Today's Date: 10/09/2019    History of Present Illness Pt is a 59 year old man admitted on 09/28/19 with N/V due to SBO. NGT placed, removed 10/01/19 with resumption of vomiting. Hospital course complicated by mild hypernatremia and AKI. s/p exploratory laparatomy with lysis of adhersions 6/14. PMH: CHF, scleroderma, HTN CAD/PCI, malnutrition.    PT Comments    Pt with slow progression towards goals. Required max encouragement for participation. Agreeable to ambulation within the room. Required min to min guard for safety. Educated about using RW at d/c, however, pt refusing. Will continue to follow acutely to maximize functional mobility independence and safety.    Follow Up Recommendations  Home health PT;Supervision for mobility/OOB     Equipment Recommendations  Other (comment) (pt refusing RW )    Recommendations for Other Services       Precautions / Restrictions Precautions Precautions: Fall Restrictions Weight Bearing Restrictions: No    Mobility  Bed Mobility Overal bed mobility: Needs Assistance Bed Mobility: Supine to Sit     Supine to sit: Min assist     General bed mobility comments: Min A for trunk elevation.   Transfers Overall transfer level: Needs assistance Equipment used: Rolling walker (2 wheeled) Transfers: Sit to/from Stand Sit to Stand: Min assist         General transfer comment: Min A for steadying assist to stand.   Ambulation/Gait Ambulation/Gait assistance: Min guard Gait Distance (Feet): 10 Feet Assistive device: Rolling walker (2 wheeled) Gait Pattern/deviations: Trunk flexed;Shuffle;Decreased stride length Gait velocity: Decreased   General Gait Details: Slow, guarded gait. Pt only agreeable to ambulating within the room. Min guard for safety.    Stairs             Wheelchair Mobility    Modified Rankin (Stroke Patients  Only)       Balance Overall balance assessment: Needs assistance Sitting-balance support: No upper extremity supported Sitting balance-Leahy Scale: Fair     Standing balance support: Bilateral upper extremity supported;During functional activity Standing balance-Leahy Scale: Poor Standing balance comment: Requires UE support                            Cognition Arousal/Alertness: Awake/alert Behavior During Therapy: Flat affect Overall Cognitive Status: Impaired/Different from baseline Area of Impairment: Memory;Safety/judgement;Problem solving                     Memory: Decreased short-term memory;Decreased recall of precautions   Safety/Judgement: Decreased awareness of deficits;Decreased awareness of safety   Problem Solving: Slow processing;Decreased initiation;Requires verbal cues;Requires tactile cues General Comments: Pt reports nursing staff said he pulled out his NG tube, but pt does not remember. Slow to move. Decreased awareness of deficits.       Exercises      General Comments General comments (skin integrity, edema, etc.): Educated about using RW at d/c, however, pt refusing.       Pertinent Vitals/Pain Pain Assessment: Faces Faces Pain Scale: Hurts little more Pain Location: abdomen when coughing Pain Descriptors / Indicators: Operative site guarding;Sore;Guarding Pain Intervention(s): Limited activity within patient's tolerance;Monitored during session;Repositioned    Home Living                      Prior Function            PT Goals (current goals can now  be found in the care plan section) Acute Rehab PT Goals Patient Stated Goal: to go for a drive in his truck  PT Goal Formulation: With patient Time For Goal Achievement: 10/21/19 Potential to Achieve Goals: Fair Progress towards PT goals: Progressing toward goals    Frequency    Min 3X/week      PT Plan Current plan remains appropriate     Co-evaluation              AM-PAC PT "6 Clicks" Mobility   Outcome Measure  Help needed turning from your back to your side while in a flat bed without using bedrails?: None Help needed moving from lying on your back to sitting on the side of a flat bed without using bedrails?: A Little Help needed moving to and from a bed to a chair (including a wheelchair)?: A Little Help needed standing up from a chair using your arms (e.g., wheelchair or bedside chair)?: A Little Help needed to walk in hospital room?: A Little Help needed climbing 3-5 steps with a railing? : A Lot 6 Click Score: 18    End of Session   Activity Tolerance: Patient limited by fatigue Patient left: in chair;with call bell/phone within reach;with family/visitor present Nurse Communication: Mobility status PT Visit Diagnosis: Unsteadiness on feet (R26.81);Pain;Difficulty in walking, not elsewhere classified (R26.2) Pain - part of body:  (abdomen)     Time: 9357-0177 PT Time Calculation (min) (ACUTE ONLY): 15 min  Charges:  $Gait Training: 8-22 mins                     Lou Miner, DPT  Acute Rehabilitation Services  Pager: [phone] Office: [phone]    Rudean Hitt 10/09/2019, 3:41 PM

## 2019-10-09 NOTE — Progress Notes (Signed)
Nutrition Follow-up  DOCUMENTATION CODES:   Underweight, Severe malnutrition in context of chronic illness  INTERVENTION:   -Continue Boost Breeze po TID, each supplement provides 250 kcal and 9 grams of protein -MVI with minerals daily -RD will follow for diet advancement and adjust supplement regimen as appropriate  NUTRITION DIAGNOSIS:   Severe Malnutrition related to chronic illness (CHF, scleroderma) as evidenced by moderate fat depletion, severe fat depletion, moderate muscle depletion, severe muscle depletion.  Ongoing  GOAL:   Patient will meet greater than or equal to 90% of their needs  Progressing  MONITOR:   PO intake, Supplement acceptance, Diet advancement, Labs, Weight trends, Skin, I & O's  REASON FOR ASSESSMENT:   Consult Assessment of nutrition requirement/status  ASSESSMENT:   [name] is a 59 y.o. male with medical history significant of scleroderma, HTN, CAD, HLD, systolic CHF who presents to ED with complaints of 2 days hx of n/v, unable to tolerate PO. Patient recently had discussed with GI regarding coughing up blood and was planned for EGD and colonoscopy at a later date. Patient later called GI with complaints of frequent bilious emesis without coffee grounds. Patient was referred to ED for further work up.  6/6- NGT placed 6/9- NGT removed, advanced to full liquid diet 6/10- downgraded to clear liquid diet 6/11- NPO, NGT placed 6/14- s/p Procedure: Exploratory laparotomy with lysis of adhesions x1 hour 6/16- NGT d/c 6/17- advanced to clear liquid diet   Reviewed I/O's: -800 ml x 24 hours and -9.3 L since admission  UOP: 800 ml x 24 hours  Pt pulled NGT out overnight. Per pharmacy, plan was to start TPN today, however, this was put on hold as pt had a BM and was placed on a clear liquid diet. Boost Breeze supplements were ordered.   Noted that has been NPO/ clear liquids since 10/02/19.   Medications reviewed and include robaxin.    Labs reviewed: K and Mg WDL. Phos: 2.4. Inpatient orders for glycemic control are 0-9 units insulin aspart every 6 hours.   Diet Order:   Diet Order            Diet NPO time specified Except for: Ice Chips  Diet effective now                 EDUCATION NEEDS:   Education needs have been addressed  Skin:  Skin Assessment: Skin Integrity Issues: Skin Integrity Issues:: Incisions Incisions: closed abdomen  Last BM:  10/04/19  Height:   Ht Readings from Last 1 Encounters:  10/06/19 5' 7.99" (1.727 m)    Weight:   Wt Readings from Last 1 Encounters:  10/09/19 54.7 kg    Ideal Body Weight:  63.6 kg  BMI:  Body mass index is 18.34 kg/m.  Estimated Nutritional Needs:   Kcal:  2000-2200  Protein:  105-110 grams  Fluid:  > 2 L    Loistine Chance, RD, LDN, [place] Registered Dietitian II Certified Diabetes Care and Education Specialist Please refer to [facility] for RD and/or RD on-call/weekend/after hours pager

## 2019-10-09 NOTE — Progress Notes (Addendum)
PHARMACY - TOTAL PARENTERAL NUTRITION CONSULT NOTE   Indication: Prolonged ileus  Patient Measurements: Height: 5' 7.99" (172.7 cm) Weight: 54.7 kg (120 lb 9.6 oz) IBW/kg (Calculated) : 68.38 TPN AdjBW (KG): 50 Body mass index is 18.34 kg/m. Usual Weight:   Assessment: 26 YOM who presented on 6/6 with N/V and found to have SBO, NGT placed to suction. Pt w/ BM on 6/9, NGT removed, and diet advanced. The patient was noted to have recurrent N/V and distention so NGT replaced on 6/11- did not improve so pt underwent ex-lap with LOA on 6/14. Given prolonged NPO and poor po intake, pharmacy now consulted to start TPN while awaiting bowel recovery.  Glucose / Insulin: CBGs<150 - on no SSI Electrolytes: K 3.6, Cl 119, HCO3 18, Phos 2.4, Mg 1.8 Renal: Resolving AoCKD III with SCr down to 2.25 << 2.59 LFTs / TGs: LFTs/Tbili wnl, TG ordered for 6/18 AM Prealbumin / albumin: Pre-albumin 7.6 (6/16), alb 2.1 Intake / Output; MIVF: NaBicarb 150 mEq/L @ 125 cc/hr, NGT pulled out, no flatus GI Imaging: 6/11: persistent or recurrent SBO Surgeries / Procedures:  6/14: ex-lap with lysis of adhesions  Central access:  TPN start date:   Nutritional Goals: Still awaiting RD recommendations Per Rx calculations: estimated KCal ~1700-2000 kcal, ~80g protein, fluid~ 1.6L  Current Nutrition:  NPO  Plan:  Start TPN at 40 mL/hr at 1800 to provide 40g protein, 154g CHO, and 28g lipids to provide 961 kcal Electrolytes in TPN: 42mq/L of Na, 541m/L of K, 70m46mL of Ca, 70mE49m of Mg, and 170mm7m of Phos, max acetate KCl 10 mEq x 2 and Magnesium 1g IV x 1 today Add standard MVI and trace elements to TPN Initiate Sensitive q6h SSI and adjust as needed  Reduce MIVF to 85 mL/hr at 1800 Monitor TPN labs on Mon/Thurs, BMET, Mg/Phos in AM  Addendum: Holding off on initiation today as the patient had a bowel movement, will adjust orders accordingly. Please re-consult us ifKoreaPN is needed. Pharmacy will sign off of  consult.   Thank you for allowing pharmacy to be a part of this patient's care.  ElizaAlycia RossettirmD, BCPS Clinical Pharmacist 10/09/2019 8:58 AM   **Pharmacist phone directory can now be found on [email or web address] ([email or web address]).  Listed under [facility]

## 2019-10-09 NOTE — Progress Notes (Signed)
PROGRESS NOTE  [name] OVF:[id number] DOB: 1960-07-03   PCP: [name], MD  Patient is from: Home.  DOA: 09/28/2019 LOS: 11  Brief Narrative / Interim history: 59 year old male with history of systolic CHF, scleroderma, HTN, CAD/PCI, HLD and malnutrition presenting with 2 days of nausea and vomiting and found to have small bowel obstruction.  He was managed conservatively with NG tube bowel decompression and IV fluid with improvement in symptoms.  NG tube discontinued on 10/01/2019.  He was cleared for discharge by general surgery from surgical standpoint. However, patient has significant emesis after NG tube removal.  General surgery recommended reinserting NGT.  Initially, patient refused NG tube replacement but he continued to have nausea and vomiting without bowel movement.  KUB without signs of SBO.  Attempted water enema without significant results continue to have intractable nausea and vomiting.  General surgery reconsulted on 6/11.  NG tube replaced.  CT abdomen and pelvis ordered.  Lumbar GI consulted as well for possible ileus/constipation.  Repeat CT abdomen and pelvis with persistent or recurrent SBO.  Cardiology consulted for preop eval in case he needs surgery.  Patient underwent ex lap with lysis of the adhesion by Dr. Donne Hazel on 10/06/2019.  He pulled out NG tube the night of 6/16.  Initially the plan was to initiate TPN but patient had massive BM on 6/17.  Started on clear liquid diet.  Hospital course complicated by progressive AKI with significant azotemia. Nephrology consulted.  Renal ultrasound without acute finding.  Received IV fluid boluses with increased maintenance rate.  Renal function improved.  Subjective: Seen and examined earlier this morning.  Patient pulled out his NG tube last night.  He had big bowel movement later this morning.  Cough improved.  No complaints.  Denies chest pain or dyspnea.  Patient's wife concerned about his confusion.  She asked  to stop the new medication for sleep.  Objective: Vitals:   10/08/19 1430 10/08/19 2007 10/09/19 0421 10/09/19 0751  BP: 132/76 (!) 151/78 (!) 154/86 (!) 164/85  Pulse: 96 87 86 93  Resp: _0 Temp: 98.2 F (36.8 C) 99.4 F (37.4 C) 99 F (37.2 C) 98.7 F (37.1 C)  TempSrc: Oral Oral Oral Oral  SpO2: 98% 96% 97% 97%  Weight:   54.7 kg   Height:        Intake/Output Summary (Last 24 hours) at 10/09/2019 1525 Last data filed at 10/09/2019 1052 Gross per 24 hour  Intake --  Output 850 ml  Net -850 ml   Filed Weights   10/06/19 0512 10/06/19 0833 10/09/19 0421  Weight: 50 kg 50 kg 54.7 kg    Examination:  GENERAL: Frail and chronically ill-appearing.  No apparent distress.  Nontoxic. HEENT: MMM.  Vision and hearing grossly intact.  NECK: Supple.  No apparent JVD.  RESP:  No IWOB.  Fair aeration bilaterally. CVS:  RRR. Heart sounds normal.  ABD/GI/GU: Diminished BS. Abd soft, NTND.  Wound dressing DCI. MSK/EXT:  Moves extremities.  Significant muscle mass and subcu fat loss. SKIN: Honeycomb dressing over surgical wound DCI. NEURO: Awake, alert and oriented appropriately.  No apparent focal neuro deficit. PSYCH: Calm. Normal affect.   Procedures:  6/6-6/9 NG tube for SBO 6/11-NG tube replaced. 6/14-exploratory laparotomy with lysis of adhesion by Dr. Donne Hazel  Microbiology summarized: COVID-19 PCR negative.  Assessment & Plan: Small bowel obstruction-repeat CT abdomen and pelvis on 6/11 with persistent or recurrent SBO. Intractable nausea and vomiting-resolved. -Failed conservative measures  with NG tube bowel decompression. -Exploratory laparotomy by Dr. Donne Hazel on 6/14 -Pulled his NG tube the night of 6/11-6/17 -Hard bowel movement on 6/17. -Surgical team started clear liquid diet. -Antiemetics and IV fluid -Prealbumin 7.6.  Appreciate RD input. -Mobilize/OOB/PT/OT.   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  BUN 51 >> 106>>48.  About 1.0 L UOP/24 hrs.  Renal US without acute finding.   -IV fluid per nephrology -Avoid nephrotoxic meds -Continue monitoring.  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  Hypernatremia: Na 149>> 141> 145.  -On sodium bicarb per nephrology.  Hypokalemia/hypomagnesemia: Likely due to GI loss.  K3.3. -Monitor and replenish as appropriate  Metabolic acidosis: Likely due to AKI and IV fluid.  Stable -Started on sodium bicarb by nephrology.  Fever: Spiked fever to 101.4 on 6/11, then 100.4 on 6/13.  Afebrile since then.No obvious source of infection.  No leukocytosis.  -Further work-up if he spikes fever again.  Chronic diastolic CHF: Echo on 11/23/6551 with EF of 55%, G2-DD and RVSP of 33.72mHg.  He is probably hypovolemic from GI loss.  No cardiopulmonary symptoms.  Net negative.  Followed by Dr. BHaroldine Lawsoutpatient. -Cardiology following. -Monitor fluid status while on IV fluid -Hold home diuretics  Pulmonary hypertension: Echo as above. RHC in 06/2018 with PA of 81/26 and PCWP of 17 -Resume home Opsumit and Revatio  Scleroderma-stable.  Followed by Dr. DEstanislado Pandy-Continue home Norvasc, sildenafil  History of CAD s/p PCA-no anginal symptoms. 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 -Recheck H&H this afternoon -Continue monitoring.  Productive cough: Improved -Mucolytic's and antitussive  Delirium -Reorientation and delirium precautions. -Discontinued lorazepam at wife's request.  Debility/physical deconditioning due to acute illness and hospitalization -PT/OT eval  Severe malnutrition:Body mass index is 18.34 kg/m.  Significant muscle mass and subcu fat loss.  Prealbumin 7.6. Nutrition Problem: Severe  Malnutrition Etiology: chronic illness (CHF, scleroderma) Signs/Symptoms: moderate fat depletion, severe fat depletion, moderate muscle depletion, severe muscle depletion Interventions: Ensure Enlive (each supplement provides 350kcal and 20 grams of protein), Magic cup, MVI   DVT prophylaxis: Subcu heparin Code Status: Full code Family Communication: Updated patient's wife at the bedside Status is: Inpatient  Remains inpatient appropriate because:IV treatments appropriate due to intensity of illness or inability to take PO, Inpatient level of care appropriate due to severity of illness and Ongoing emesis   Dispo: The patient is from: Home              Anticipated d/c is to: Home              Anticipated d/c date is: > 3 days              Patient currently is not medically stable to d/c.       Consultants:  General surgery Gastroenterology Nephrology Cardiology  Sch Meds:  Scheduled Meds: . allopurinol  100 mg Oral Daily  . amLODipine  5 mg Oral Daily  . Chlorhexidine Gluconate Cloth  6 each Topical Daily  . escitalopram  5 mg Oral Daily  . feeding supplement  1 Container Oral TID BM  . heparin  5,000 Units Subcutaneous Q8H  . insulin aspart  0-9 Units Subcutaneous Q6H  . macitentan  10  mg Oral Daily  . multivitamin with minerals  1 tablet Oral Daily  . pantoprazole (PROTONIX) IV  40 mg Intravenous Q24H  . sildenafil  40 mg Oral TID  . simvastatin  40 mg Oral Daily  . sodium chloride flush  3 mL Intravenous Once   Continuous Infusions: . methocarbamol (ROBAXIN) IV 500 mg (10/09/19 1420)  .  sodium bicarbonate (isotonic) infusion in sterile water 125 mL/hr at 10/09/19 1247   PRN Meds:.bisacodyl, guaiFENesin-dextromethorphan, HYDROmorphone (DILAUDID) injection, labetalol, ondansetron **OR** ondansetron (ZOFRAN) IV, phenol, prochlorperazine  Antimicrobials: Anti-infectives (From admission, onward)   Start     Dose/Rate Route Frequency Ordered Stop   10/06/19 0745   ceFAZolin (ANCEF) IVPB 2g/100 mL premix        2 g 200 mL/hr over 30 Minutes Intravenous  Once 10/06/19 0735 10/06/19 1700   10/06/19 0600  cefoTEtan (CEFOTAN) 2 g in sodium chloride 0.9 % 100 mL IVPB        2 g 200 mL/hr over 30 Minutes Intravenous On call to O.R. 10/05/19 8032 10/07/19 0843       I have personally reviewed the following labs and images: CBC: Recent Labs  Lab 10/04/19 0633 10/04/19 0633 10/05/19 0833 10/05/19 0833 10/06/19 0237 10/07/19 0334 10/08/19 0829 10/08/19 1635 10/09/19 0450  WBC 9.5  --  7.0  --  7.5 10.6* 9.0  --   --   NEUTROABS  --   --   --   --   --   --   --   --  7.6  HGB 10.8*   < > 9.5*   < > 9.9* 9.7* 8.3* 8.9* 8.7*  HCT 34.4*   < > 30.6*   < > 31.9* 32.5* 27.5* 29.8* 28.9*  MCV 97.2  --  98.7  --  98.8 101.6* 101.5*  --   --   PLT 179  --  166  --  203 189 177  --   --    < > = values in this interval not displayed.   BMP &GFR Recent Labs  Lab 10/05/19 0833 10/06/19 0237 10/07/19 0334 10/08/19 0829 10/09/19 0450  NA 141 146* 145 145 145  K 3.6 3.5 3.9 3.3* 3.6  CL 108 112* 113* 114* 119*  CO2 21* 23 19* 20* 18*  GLUCOSE 107* 113* 132* 100* 76  BUN 100* 81* 78* 62* 48*  CREATININE 4.74* 3.43* 3.39* 2.59* 2.25*  CALCIUM 8.2* 8.2* 7.9* 7.8* 7.8*  MG 2.4 2.3 2.3 2.1 1.8  PHOS 6.2* 4.9* 6.2* 3.4 2.4*   Estimated Creatinine Clearance: 27 mL/min (A) (by C-G formula based on SCr of 2.25 mg/dL (H)). Liver & Pancreas: Recent Labs  Lab 10/05/19 0833 10/06/19 0237 10/07/19 0334 10/08/19 0829 10/09/19 0450  AST  --   --   --   --  20  ALT  --   --   --   --  9  ALKPHOS  --   --   --   --  70  BILITOT  --   --   --   --  0.5  PROT  --   --   --   --  5.2*  ALBUMIN 2.7* 2.5* 2.1* 2.0* 2.1*   No results for input(s): LIPASE, AMYLASE in the last 168 hours. No results for input(s): AMMONIA in the last 168 hours. Diabetic: No results for input(s): HGBA1C in the last 72 hours. No results for input(s): GLUCAP in the last 168  hours.  Cardiac Enzymes: No results for input(s): CKTOTAL, CKMB, CKMBINDEX, TROPONINI in the last 168 hours. No results for input(s): PROBNP in the last 8760 hours. Coagulation Profile: No results for input(s): INR, PROTIME in the last 168 hours. Thyroid Function Tests: No results for input(s): TSH, T4TOTAL, FREET4, T3FREE, THYROIDAB in the last 72 hours. Lipid Profile: No results for input(s): CHOL, HDL, LDLCALC, TRIG, CHOLHDL, LDLDIRECT in the last 72 hours. Anemia Panel: No results for input(s): VITAMINB12, FOLATE, FERRITIN, TIBC, IRON, RETICCTPCT in the last 72 hours. Urine analysis:    Component Value Date/Time   COLORURINE YELLOW 10/03/2019 2300   APPEARANCEUR HAZY (A) 10/03/2019 2300   LABSPEC 1.016 10/03/2019 2300   PHURINE 5.0 10/03/2019 2300   GLUCOSEU NEGATIVE 10/03/2019 2300   HGBUR NEGATIVE 10/03/2019 2300   BILIRUBINUR SMALL (A) 10/03/2019 2300   KETONESUR NEGATIVE 10/03/2019 2300   PROTEINUR 100 (A) 10/03/2019 2300   NITRITE NEGATIVE 10/03/2019 2300   LEUKOCYTESUR NEGATIVE 10/03/2019 2300   Sepsis Labs: Invalid input(s): PROCALCITONIN, [place]  Microbiology: Recent Results (from the past 240 hour(s))  Surgical pcr screen     Status: None   Collection Time: 10/05/19 10:07 AM   Specimen: Nasal Mucosa; Nasal Swab  Result Value Ref Range Status   MRSA, PCR NEGATIVE NEGATIVE Final   Staphylococcus aureus NEGATIVE NEGATIVE Final    Comment: (NOTE) The Xpert SA Assay (FDA approved for NASAL specimens in patients 26 years of age and older), is one component of a comprehensive surveillance program. It is not intended to diagnose infection nor to guide or monitor treatment. Performed at [facility], [place] [address], [place], [place] [address]     Radiology Studies: No results found.    Pio Eatherly T. Toyah  If 7PM-7AM, please contact night-coverage [email or web address] Password [facility] 10/09/2019, 3:25 PM

## 2019-10-09 NOTE — Progress Notes (Signed)
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 Adin Hector.  Baseline creatinine 2.5 mg/dL.  Renal ultrasound revealed no evidence of obstruction.  Maintenance IV fluids have been given 1.5 L normal saline bolus administered as patient appears to be dry and cachectic.    Status post lysis of adhesions with laparotomy 10/06/2019  Blood pressure 134/86 pulse 86 temperature 99 O2 sats 97% room air  Sodium 145 potassium 3.6 chloride 119 CO2 18 BUN 48 creatinine 2.25 calcium 7.8 phosphorus of 2.4 magnesium 1.8 albumin 2.1 hemoglobin 8.7  IV D5 1/2 normal saline 125 cc an hour   Objective:  Vital signs in last 24 hours:  Temp:  [98.2 F (36.8 C)-99.4 F (37.4 C)] 99 F (37.2 C) (06/17 0421) Pulse Rate:  [86-96] 86 (06/17 0421) Resp:  [15-18] 18 (06/17 0421) BP: (132-154)/(76-86) 154/86 (06/17 0421) SpO2:  [96 %-98 %] 97 % (06/17 0421) Weight:  [54.7 kg] 54.7 kg (06/17 0421)  Weight change:  Filed Weights   10/06/19 0512 10/06/19 0833 10/09/19 0421  Weight: 50 kg 50 kg 54.7 kg    Intake/Output: I/O last 3 completed shifts: In: 0  Out: 800 [Urine:800]   Intake/Output this shift:  No intake/output data recorded.  General: Cachectic older looking male lying on bed with NG tube in place Heart:RRR, s1s2 nl, no rubs Lungs: Clear b/l, no crackle Abdomen:soft, Non-tender, non-distended Extremities:No LE edema Neurology: Alert, awake and following commands.  No asterixis   Basic Metabolic Panel: Recent Labs  Lab 10/05/19 0833 10/05/19 0833 10/06/19 0237 10/06/19 0237 10/07/19 0334 10/08/19 0829 10/09/19 0450  NA 141  --  146*  --  145 145 145  K 3.6  --  3.5  --  3.9 3.3* 3.6  CL 108  --  112*  --  113* 114* 119*   CO2 21*  --  23  --  19* 20* 18*  GLUCOSE 107*  --  113*  --  132* 100* 76  BUN 100*  --  81*  --  78* 62* 48*  CREATININE 4.74*  --  3.43*  --  3.39* 2.59* 2.25*  CALCIUM 8.2*   < > 8.2*   < > 7.9* 7.8* 7.8*  MG 2.4  --  2.3  --  2.3 2.1 1.8  PHOS 6.2*  --  4.9*  --  6.2* 3.4 2.4*   < > = values in this interval not displayed.    Liver Function Tests: Recent Labs  Lab 10/05/19 0833 10/06/19 0237 10/07/19 0334 10/08/19 0829 10/09/19 0450  AST  --   --   --   --  20  ALT  --   --   --   --  9  ALKPHOS  --   --   --   --  70  BILITOT  --   --   --   --  0.5  PROT  --   --   --   --  5.2*  ALBUMIN 2.7* 2.5* 2.1* 2.0* 2.1*   No results for input(s): LIPASE, AMYLASE in the last 168 hours. No results for input(s): AMMONIA in the last 168 hours.  CBC: Recent Labs  Lab 10/04/19 1610 10/04/19 9604 10/05/19 5409 10/05/19 8119 10/06/19 0237 10/07/19 0334 10/08/19 0829 10/08/19 1635  10/09/19 0450  WBC 9.5  --  7.0  --  7.5 10.6* 9.0  --   --   NEUTROABS  --   --   --   --   --   --   --   --  7.6  HGB 10.8*   < > 9.5*   < > 9.9* 9.7* 8.3* 8.9* 8.7*  HCT 34.4*   < > 30.6*   < > 31.9* 32.5* 27.5* 29.8* 28.9*  MCV 97.2  --  98.7  --  98.8 101.6* 101.5*  --   --   PLT 179  --  166  --  203 189 177  --   --    < > = values in this interval not displayed.    Cardiac Enzymes: No results for input(s): CKTOTAL, CKMB, CKMBINDEX, TROPONINI in the last 168 hours.  BNP: Invalid input(s): POCBNP  CBG: No results for input(s): GLUCAP in the last 168 hours.  Microbiology: Results for orders placed or performed during the hospital encounter of 09/28/19  SARS Coronavirus 2 by RT PCR (hospital order, performed in [facility] hospital lab) Nasopharyngeal Nasopharyngeal Swab     Status: None   Collection Time: 09/28/19  4:59 PM   Specimen: Nasopharyngeal Swab  Result Value Ref Range Status   SARS Coronavirus 2 NEGATIVE NEGATIVE Final    Comment: (NOTE) SARS-CoV-2 target nucleic  acids are NOT DETECTED. The SARS-CoV-2 RNA is generally detectable in upper and lower respiratory specimens during the acute phase of infection. The lowest concentration of SARS-CoV-2 viral copies this assay can detect is 250 copies / mL. A negative result does not preclude SARS-CoV-2 infection and should not be used as the sole basis for treatment or other patient management decisions.  A negative result may occur with improper specimen collection / handling, submission of specimen other than nasopharyngeal swab, presence of viral mutation(s) within the areas targeted by this assay, and inadequate number of viral copies (<250 copies / mL). A negative result must be combined with clinical observations, patient history, and epidemiological information. Fact Sheet for Patients:   StrictlyIdeas.no Fact Sheet for Healthcare Providers: BankingDealers.co.za This test is not yet approved or cleared  by the [place] FDA and has been authorized for detection and/or diagnosis of SARS-CoV-2 by FDA under an Emergency Use Authorization (EUA).  This EUA will remain in effect (meaning this test can be used) for the duration of the COVID-19 declaration under Section 564(b)(1) of the Act, 21 U.S.C. section 360bbb-3(b)(1), unless the authorization is terminated or revoked sooner. Performed at [facility], [place] [address] [place], [place] [address]   Surgical pcr screen     Status: None   Collection Time: 10/05/19 10:07 AM   Specimen: Nasal Mucosa; Nasal Swab  Result Value Ref Range Status   MRSA, PCR NEGATIVE NEGATIVE Final   Staphylococcus aureus NEGATIVE NEGATIVE Final    Comment: (NOTE) The Xpert SA Assay (FDA approved for NASAL specimens in patients 14 years of age and older), is one component of a comprehensive surveillance program. It is not intended to diagnose infection nor to guide or monitor treatment. Performed at [facility], [place] [address] [place], [place] [address]     Coagulation Studies: No results for input(s): LABPROT, INR in the last 72 hours.  Urinalysis: No results for input(s): COLORURINE, LABSPEC, PHURINE, GLUCOSEU, HGBUR, BILIRUBINUR, KETONESUR, PROTEINUR, UROBILINOGEN, NITRITE, LEUKOCYTESUR in the last 72 hours.  Invalid input(s): APPERANCEUR  Imaging: [place] EKG SITE RITE  Result Date: 10/08/2019 If Site Rite image not attached, placement could not be confirmed due to current cardiac rhythm.    Medications:   . dextrose 5 % and 0.45% NaCl 125 mL/hr at 10/08/19 1133  . methocarbamol (ROBAXIN) IV 500 mg (10/09/19 0725)   . Chlorhexidine Gluconate Cloth  6 each Topical Daily  . heparin  5,000 Units Subcutaneous Q8H  . LORazepam  0.25 mg Intravenous QHS  . pantoprazole (PROTONIX) IV  40 mg Intravenous Q24H  . sodium chloride flush  3 mL Intravenous Once   bisacodyl, guaiFENesin-dextromethorphan, HYDROmorphone (DILAUDID) injection, labetalol, ondansetron **OR** ondansetron (ZOFRAN) IV, phenol, prochlorperazine  Assessment/ Plan:   Acute kidney injury setting of baseline chronic kidney disease stage IV.  Baseline creatinine appears about 2.5.  He has membranous nephropathy per biopsy.  Renal ultrasound does not reveal any evidence of hydronephrosis.  Patient appeared to be cachectic and dry on admission.  Received IV fluids.  He continues to receive 125 cc/h.  He continues to make urine with 400 cc out 10/07/2019.  NG tube output.  We will continue to follow as renal function slowly improves  Mild metabolic acidosis we will change IV fluids to D5W with 3 A of bicarb at 125 cc an hour  Small bowel obstruction conservative management initially underwent exploratory laparotomy with lysis of adhesions 10/06/2018.  Hypertension/volume continue to follow blood pressures acceptable.  Continue IV fluids at 125 cc an hour D5 normal saline.  Anemia received IV iron and  Aranesp.  Hypernatremia mild we will continue to follow  Hypokalemia appears to resolved.   LOS: [place] _0 _1 :44 AM

## 2019-10-10 LAB — RENAL FUNCTION PANEL
Albumin: 1.9 g/dL — ABNORMAL LOW (ref 3.5–5.0)
Anion gap: 8 (ref 5–15)
BUN: 38 mg/dL — ABNORMAL HIGH (ref 6–20)
CO2: 23 mmol/L (ref 22–32)
Calcium: 7.2 mg/dL — ABNORMAL LOW (ref 8.9–10.3)
Chloride: 108 mmol/L (ref 98–111)
Creatinine, Ser: 1.9 mg/dL — ABNORMAL HIGH (ref 0.61–1.24)
GFR calc Af Amer: 43 mL/min — ABNORMAL LOW (ref >60)
GFR calc non Af Amer: 37 mL/min — ABNORMAL LOW (ref >60)
Glucose, Bld: 75 mg/dL (ref 70–99)
Phosphorus: 2.2 mg/dL — ABNORMAL LOW (ref 2.5–4.6)
Potassium: 3.5 mmol/L (ref 3.5–5.1)
Sodium: 139 mmol/L (ref 135–145)

## 2019-10-10 LAB — MAGNESIUM: Magnesium: 1.8 mg/dL (ref 1.7–2.4)

## 2019-10-10 LAB — HEMOGLOBIN A1C
Hgb A1c MFr Bld: 4.9 % (ref 4.8–5.6)
Mean Plasma Glucose: 94 mg/dL

## 2019-10-10 LAB — GLUCOSE, CAPILLARY
Glucose-Capillary: 149 mg/dL — ABNORMAL HIGH (ref 70–99)
Glucose-Capillary: 48 mg/dL — ABNORMAL LOW (ref 70–99)
Glucose-Capillary: 48 mg/dL — ABNORMAL LOW (ref 70–99)
Glucose-Capillary: 110 mg/dL — ABNORMAL HIGH (ref 70–99)
Glucose-Capillary: 56 mg/dL — ABNORMAL LOW (ref 70–99)
Glucose-Capillary: 82 mg/dL (ref 70–99)
Glucose-Capillary: 73 mg/dL (ref 70–99)

## 2019-10-10 LAB — HEMOGLOBIN AND HEMATOCRIT, BLOOD
HCT: 27.3 % — ABNORMAL LOW (ref 39.0–52.0)
Hemoglobin: 8.1 g/dL — ABNORMAL LOW (ref 13.0–17.0)

## 2019-10-10 LAB — TRIGLYCERIDES: Triglycerides: 160 mg/dL — ABNORMAL HIGH (ref <150)

## 2019-10-10 MED ORDER — DEXTROSE 50 % IV SOLN
25.0000 g | Freq: Once | INTRAVENOUS | Status: AC
  Administered 2019-10-10: 25 g via INTRAVENOUS

## 2019-10-10 MED ORDER — DEXTROSE-NACL 5-0.9 % IV SOLN: INTRAVENOUS | Status: DC

## 2019-10-10 MED ORDER — PANTOPRAZOLE SODIUM 40 MG PO TBEC
40.0000 mg | DELAYED_RELEASE_TABLET | Freq: Every day | ORAL | Status: DC
  Administered 2019-10-11 – 2019-10-12 (×2): 40 mg via ORAL
  Filled 2019-10-10 (×2): qty 1

## 2019-10-10 MED ORDER — ENSURE ENLIVE PO LIQD
237.0000 mL | Freq: Two times a day (BID) | ORAL | Status: DC
  Administered 2019-10-10: 237 mL via ORAL

## 2019-10-10 MED ORDER — GLUCOSE 40 % PO GEL
1.0000 | Freq: Once | ORAL | Status: DC
  Filled 2019-10-10: qty 1

## 2019-10-10 MED ORDER — METHOCARBAMOL 500 MG PO TABS: 500.0000 mg | ORAL_TABLET | Freq: Three times a day (TID) | ORAL | Status: DC | PRN

## 2019-10-10 MED ORDER — GLUCOSE 40 % PO GEL
1.0000 | Freq: Once | ORAL | Status: AC
  Administered 2019-10-10: 37.5 g via ORAL
  Filled 2019-10-10: qty 1

## 2019-10-10 MED ORDER — DEXTROSE 50 % IV SOLN
INTRAVENOUS | Status: AC
  Filled 2019-10-10: qty 50

## 2019-10-10 MED ORDER — ENSURE ENLIVE PO LIQD
237.0000 mL | Freq: Three times a day (TID) | ORAL | Status: DC
  Administered 2019-10-10 – 2019-10-11 (×2): 237 mL via ORAL

## 2019-10-10 MED ORDER — DEXTROSE 50 % IV SOLN
INTRAVENOUS | Status: AC
  Administered 2019-10-10: 50 mL
  Filled 2019-10-10: qty 50

## 2019-10-10 NOTE — Progress Notes (Signed)
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 Adin Hector.  Baseline creatinine 2.5 mg/dL.  Renal ultrasound revealed no evidence of obstruction.  Maintenance IV fluids have been given 1.5 L normal saline bolus administered as patient appears to be dry and cachectic.    Status post lysis of adhesions with laparotomy 10/06/2019  Blood pressure 128/72 pulse 88 temperature 98.9 O2 sats 90% room air  Urine output 700 cc 10/09/2019 weight 54.8 kg stable.  Sodium 139 potassium 3.5 chloride 108 CO2 23 BUN 38 creatinine 1.9 calcium 7.2 phosphorus 2.2 magnesium 1.8 albumin 1.9 hemoglobin 8.1  IV D5 1/2 normal saline 125 cc an hour   Objective:  Vital signs in last 24 hours:  Temp:  [98.9 F (37.2 C)-99.5 F (37.5 C)] 98.9 F (37.2 C) (06/18 0801) Pulse Rate:  [88-98] 88 (06/18 0801) Resp:  [15-18] 15 (06/18 0801) BP: (128-164)/(72-87) 128/72 (06/18 0801) SpO2:  [98 %-100 %] 98 % (06/18 0801) Weight:  [54.8 kg] 54.8 kg (06/18 0601)  Weight change: 0.096 kg Filed Weights   10/06/19 0833 10/09/19 0421 10/10/19 0601  Weight: 50 kg 54.7 kg 54.8 kg    Intake/Output: I/O last 3 completed shifts: In: 240 [P.O.:240] Out: 1250 [Urine:1250]   Intake/Output this shift:  No intake/output data recorded.  General: Cachectic older looking male lying on bed with NG tube in place Heart:RRR, s1s2 nl, no rubs Lungs: Clear b/l, no crackle Abdomen:soft, Non-tender, non-distended Extremities:No LE edema Neurology: Alert, awake and following commands.  No asterixis   Basic Metabolic Panel: Recent Labs  Lab 10/06/19 0237 10/06/19 0237 10/07/19 0334 10/07/19 0334 10/08/19 0829 10/09/19 0450 10/10/19 0434  NA 146*  --  145  --  145  145 139  K 3.5  --  3.9  --  3.3* 3.6 3.5  CL 112*  --  113*  --  114* 119* 108  CO2 23  --  19*  --  20* 18* 23  GLUCOSE 113*  --  132*  --  100* 76 75  BUN 81*  --  78*  --  62* 48* 38*  CREATININE 3.43*  --  3.39*  --  2.59* 2.25* 1.90*  CALCIUM 8.2*   < > 7.9*   < > 7.8* 7.8* 7.2*  MG 2.3  --  2.3  --  2.1 1.8 1.8  PHOS 4.9*  --  6.2*  --  3.4 2.4* 2.2*   < > = values in this interval not displayed.    Liver Function Tests: Recent Labs  Lab 10/06/19 0237 10/07/19 0334 10/08/19 0829 10/09/19 0450 10/10/19 0434  AST  --   --   --  20  --   ALT  --   --   --  9  --   ALKPHOS  --   --   --  70  --   BILITOT  --   --   --  0.5  --   PROT  --   --   --  5.2*  --   ALBUMIN 2.5* 2.1* 2.0* 2.1* 1.9*   No results for input(s): LIPASE, AMYLASE in the last 168 hours. No results for input(s): AMMONIA in the last 168 hours.  CBC: Recent Labs  Lab 10/04/19 [phone] 10/04/19 [phone] 10/05/19 2563  10/05/19 0350 10/06/19 0237 10/06/19 0237 10/07/19 0334 10/08/19 0829 10/08/19 1635 10/09/19 0450 10/10/19 0434  WBC 9.5  --  7.0  --  7.5  --  10.6* 9.0  --   --   --   NEUTROABS  --   --   --   --   --   --   --   --   --  7.6  --   HGB 10.8*   < > 9.5*   < > 9.9*   < > 9.7* 8.3* 8.9* 8.7* 8.1*  HCT 34.4*   < > 30.6*   < > 31.9*   < > 32.5* 27.5* 29.8* 28.9* 27.3*  MCV 97.2  --  98.7  --  98.8  --  101.6* 101.5*  --   --   --   PLT 179  --  166  --  203  --  189 177  --   --   --    < > = values in this interval not displayed.    Cardiac Enzymes: No results for input(s): CKTOTAL, CKMB, CKMBINDEX, TROPONINI in the last 168 hours.  BNP: Invalid input(s): POCBNP  CBG: Recent Labs  Lab 10/10/19 0036 10/10/19 0112 10/10/19 0139 10/10/19 0634 10/10/19 0704  GLUCAP 48* 48* 149* 22* 110*    Microbiology: Results for orders placed or performed during the hospital encounter of 09/28/19  SARS Coronavirus 2 by RT PCR (hospital order, performed in [facility] hospital lab)  Nasopharyngeal Nasopharyngeal Swab     Status: None   Collection Time: 09/28/19  4:59 PM   Specimen: Nasopharyngeal Swab  Result Value Ref Range Status   SARS Coronavirus 2 NEGATIVE NEGATIVE Final    Comment: (NOTE) SARS-CoV-2 target nucleic acids are NOT DETECTED. The SARS-CoV-2 RNA is generally detectable in upper and lower respiratory specimens during the acute phase of infection. The lowest concentration of SARS-CoV-2 viral copies this assay can detect is 250 copies / mL. A negative result does not preclude SARS-CoV-2 infection and should not be used as the sole basis for treatment or other patient management decisions.  A negative result may occur with improper specimen collection / handling, submission of specimen other than nasopharyngeal swab, presence of viral mutation(s) within the areas targeted by this assay, and inadequate number of viral copies (<250 copies / mL). A negative result must be combined with clinical observations, patient history, and epidemiological information. Fact Sheet for Patients:   StrictlyIdeas.no Fact Sheet for Healthcare Providers: BankingDealers.co.za This test is not yet approved or cleared  by the [place] FDA and has been authorized for detection and/or diagnosis of SARS-CoV-2 by FDA under an Emergency Use Authorization (EUA).  This EUA will remain in effect (meaning this test can be used) for the duration of the COVID-19 declaration under Section 564(b)(1) of the Act, 21 U.S.C. section 360bbb-3(b)(1), unless the authorization is terminated or revoked sooner. Performed at [facility], [place] [address] [place], [place] [address]   Surgical pcr screen     Status: None   Collection Time: 10/05/19 10:07 AM   Specimen: Nasal Mucosa; Nasal Swab  Result Value Ref Range Status   MRSA, PCR NEGATIVE NEGATIVE Final   Staphylococcus aureus NEGATIVE NEGATIVE Final    Comment: (NOTE) The Xpert  SA Assay (FDA approved for NASAL specimens in patients 57 years of age and older), is one component of a comprehensive surveillance program. It is not intended to diagnose infection nor to guide or  monitor treatment. Performed at [facility], [place] [address], [place], [place] [address]     Coagulation Studies: No results for input(s): LABPROT, INR in the last 72 hours.  Urinalysis: No results for input(s): COLORURINE, LABSPEC, PHURINE, GLUCOSEU, HGBUR, BILIRUBINUR, KETONESUR, PROTEINUR, UROBILINOGEN, NITRITE, LEUKOCYTESUR in the last 72 hours.  Invalid input(s): APPERANCEUR    Imaging: [place] EKG SITE RITE  Result Date: 10/08/2019 If Site Rite image not attached, placement could not be confirmed due to current cardiac rhythm.    Medications:   . methocarbamol (ROBAXIN) IV 500 mg (10/10/19 0644)  .  sodium bicarbonate (isotonic) infusion in sterile water 125 mL/hr at 10/09/19 1247   . allopurinol  100 mg Oral Daily  . amLODipine  5 mg Oral Daily  . Chlorhexidine Gluconate Cloth  6 each Topical Daily  . dextrose  1 Tube Oral Once  . escitalopram  5 mg Oral Daily  . feeding supplement  1 Container Oral TID BM  . heparin  5,000 Units Subcutaneous Q8H  . insulin aspart  0-9 Units Subcutaneous Q6H  . macitentan  10 mg Oral Daily  . multivitamin with minerals  1 tablet Oral Daily  . pantoprazole (PROTONIX) IV  40 mg Intravenous Q24H  . sildenafil  40 mg Oral TID  . simvastatin  40 mg Oral Daily  . sodium chloride flush  3 mL Intravenous Once   bisacodyl, guaiFENesin-dextromethorphan, HYDROmorphone (DILAUDID) injection, labetalol, ondansetron **OR** ondansetron (ZOFRAN) IV, phenol, prochlorperazine  Assessment/ Plan:   Acute kidney injury setting of baseline chronic kidney disease stage IV.  Baseline creatinine appears about 2.5.  He has membranous nephropathy per biopsy.  Renal ultrasound does not reveal any evidence of hydronephrosis.  Patient appeared to be cachectic and  dry on admission.  Received IV fluids.   We will decrease rate to 75 cc an hour.  He continues to make urine with 750 cc of urine 10/09/2019. .  We will continue to follow as renal function slowly improves.  It appears that renal functions returned to baseline.  Mild metabolic acidosis seems to have improved.  Small bowel obstruction conservative management initially underwent exploratory laparotomy with lysis of adhesions 10/06/2018.  Hypertension/volume continue to follow blood pressures acceptable.     Anemia received IV iron and Aranesp.  Hypernatremia mild we will continue to follow  Hypokalemia appears to resolved.   LOS: [place] _0 _1 :01 AM

## 2019-10-10 NOTE — Progress Notes (Signed)
Hypoglycemic Event  CBG: 56  Treatment: 12.5 D50  Symptoms: None  Follow-up CBG: [id number] CBG Result:110  Possible Reasons for Event: See previous note  Comments/MD notified:Day shift nurse updated and asked to ask MD if D5 can be added to IVF.    Sue Lush

## 2019-10-10 NOTE — Progress Notes (Addendum)
Patient ID: [name], male   DOB: 06/29/1960, 59 y.o.   MRN: [id number]    4 Days Post-Op  Subjective: Having multiple BMs.  Tolerating liquids with no N/V.  Doesn't want more than liquids.  Wants to go home.    ROS: See above, otherwise other systems negative  Objective: Vital signs in last 24 hours: Temp:  [98.9 F (37.2 C)-99.5 F (37.5 C)] 98.9 F (37.2 C) (06/18 0801) Pulse Rate:  [88-98] 88 (06/18 0801) Resp:  [15-18] 15 (06/18 0801) BP: (128-164)/(72-87) 128/72 (06/18 0801) SpO2:  [98 %-100 %] 98 % (06/18 0801) Weight:  [54.8 kg] 54.8 kg (06/18 0601) Last BM Date: 10/09/19  Intake/Output from previous day: 06/17 0701 - 06/18 0700 In: 240 [P.O.:240] Out: 1000 [Urine:1000] Intake/Output this shift: Total I/O In: 120 [P.O.:120] Out: -   PE: Abd: slightly more bloated than yesterday, but has scleroderma so some of the "hardness" he may have could be from that over true distention, however he has hypoactive BS, midline incision is c/d/i with staples.  Honeycomb dressing in place  Lab Results:  Recent Labs    10/08/19 0829 10/08/19 1635 10/09/19 0450 10/10/19 0434  WBC 9.0  --   --   --   HGB 8.3*   < > 8.7* 8.1*  HCT 27.5*   < > 28.9* 27.3*  PLT 177  --   --   --    < > = values in this interval not displayed.   BMET Recent Labs    10/09/19 0450 10/10/19 0434  NA 145 139  K 3.6 3.5  CL 119* 108  CO2 18* 23  GLUCOSE 76 75  BUN 48* 38*  CREATININE 2.25* 1.90*  CALCIUM 7.8* 7.2*   PT/INR No results for input(s): LABPROT, INR in the last 72 hours. CMP     Component Value Date/Time   NA 139 10/10/2019 0434   NA 145 (H) 06/11/2018 0852   K 3.5 10/10/2019 0434   CL 108 10/10/2019 0434   CO2 23 10/10/2019 0434   GLUCOSE 75 10/10/2019 0434   BUN 38 (H) 10/10/2019 0434   BUN 34 (H) 06/11/2018 0852   CREATININE 1.90 (H) 10/10/2019 0434   CREATININE 2.01 (H) 03/23/2017 1527   CALCIUM 7.2 (L) 10/10/2019 0434   PROT 5.2 (L) 10/09/2019 0450    ALBUMIN 1.9 (L) 10/10/2019 0434   AST 20 10/09/2019 0450   ALT 9 10/09/2019 0450   ALKPHOS 70 10/09/2019 0450   BILITOT 0.5 10/09/2019 0450   GFRNONAA 37 (L) 10/10/2019 0434   GFRNONAA 36 (L) 03/23/2017 1527   GFRAA 43 (L) 10/10/2019 0434   GFRAA 41 (L) 03/23/2017 1527   Lipase     Component Value Date/Time   LIPASE 40 09/28/2019 1310       Studies/Results: [place] EKG SITE RITE  Result Date: 10/08/2019 If Site Rite image not attached, placement could not be confirmed due to current cardiac rhythm.   Anti-infectives: Anti-infectives (From admission, onward)   Start     Dose/Rate Route Frequency Ordered Stop   10/06/19 0745  ceFAZolin (ANCEF) IVPB 2g/100 mL premix        2 g 200 mL/hr over 30 Minutes Intravenous  Once 10/06/19 0735 10/06/19 1700   10/06/19 0600  cefoTEtan (CEFOTAN) 2 g in sodium chloride 0.9 % 100 mL IVPB        2 g 200 mL/hr over 30 Minutes Intravenous On call to O.R. 10/05/19 3086 10/07/19 5784  Assessment/Plan HTN HLD CAD  Hypothyroidism ARF- Cr1.9 - Per TRH - PCM - prealbumin 7.6  TNA to start after line placed  SBO S/p exploratory laparotomy and LOA 10/06/19 Dr. Donne Hazel - POD#4 -cont CLD today per patient request and slightly more bloated today, despite moving his bowels well - PT/OT, mobilize   FEN -CLD VTE -SCDs,SQheparin  ID -none Foley - out   LOS: 12 days    Henreitta Cea , [facility] 10/10/2019, 9:18 AM Please see Amion for pager number during day hours 7:00am-4:30pm or 7:00am -11:30am on weekends

## 2019-10-10 NOTE — Progress Notes (Signed)
PROGRESS NOTE  [name] GTX:[id number] DOB: July 18, 1960   PCP: [name], MD  Patient is from: Home.  DOA: 09/28/2019 LOS: 12  Brief Narrative / Interim history: 59 year old male with history of systolic CHF, scleroderma, HTN, CAD/PCI, HLD and malnutrition presenting with 2 days of nausea and vomiting and found to have small bowel obstruction.  He was managed conservatively with NG tube bowel decompression and IV fluid with improvement in symptoms.  NG tube discontinued on 10/01/2019.  He was cleared for discharge by general surgery from surgical standpoint. However, patient has significant emesis after NG tube removal.  General surgery recommended reinserting NGT.  Initially, patient refused NG tube replacement but he continued to have nausea and vomiting without bowel movement.  KUB without signs of SBO.  Attempted water enema without significant results continue to have intractable nausea and vomiting.  General surgery reconsulted on 6/11.  NG tube replaced.  CT abdomen and pelvis ordered.  Lumbar GI consulted as well for possible ileus/constipation.  Repeat CT abdomen and pelvis with persistent or recurrent SBO.  Cardiology consulted for preop eval in case he needs surgery.  Patient underwent ex lap with lysis of the adhesion by Dr. Donne Hazel on 10/06/2019.  He pulled out NG tube the night of 6/16.  Initially the plan was to initiate TPN but patient had massive BM on 6/17.  Started on clear liquid diet.  Hospital course complicated by progressive AKI with significant azotemia. Nephrology consulted.  Renal ultrasound without acute finding.  Received IV fluid boluses with increased maintenance rate.  Renal function improved.  Subjective: Seen and examined earlier this morning.  Hypoglycemic events overnight to 40s and 50s. Had loose BM this morning.  Passing gases.  No complaints.  Denies pain, nausea, vomiting or shortness of breath.  Objective: Vitals:   10/09/19 2037 10/10/19  0542 10/10/19 0601 10/10/19 0801  BP: 137/83 130/80  128/72  Pulse: 98 88  88  Resp: _0 Temp: 99.5 F (37.5 C) 98.9 F (37.2 C)  98.9 F (37.2 C)  TempSrc: Oral Oral  Oral  SpO2: 100% 100%  98%  Weight:   54.8 kg   Height:        Intake/Output Summary (Last 24 hours) at 10/10/2019 1422 Last data filed at 10/10/2019 1300 Gross per 24 hour  Intake 480 ml  Output 550 ml  Net -70 ml   Filed Weights   10/06/19 0833 10/09/19 0421 10/10/19 0601  Weight: 50 kg 54.7 kg 54.8 kg    Examination:  GENERAL: Frail and chronically ill-appearing.  No apparent distress. HEENT: MMM.  Vision and hearing grossly intact.  NECK: Supple.  No apparent JVD.  RESP:  No IWOB.  Fair aeration bilaterally. CVS:  RRR. Heart sounds normal.  ABD/GI/GU: BS+. Abd soft, NTND.  Dressing DCI. MSK/EXT:  Moves extremities.  Significant muscle mass and subcu fat loss. SKIN: Honeycomb dressing over surgical wound DCI. NEURO: Awake, alert and oriented fairly.  No apparent focal neuro deficit. PSYCH: Calm. Normal affect. Procedures:  6/6-6/9 NG tube for SBO 6/11-NG tube replaced. 6/14-exploratory laparotomy with lysis of adhesion by Dr. Donne Hazel  Microbiology summarized: COVID-19 PCR negative.  Assessment & Plan: Small bowel obstruction-repeat CT abdomen and pelvis on 6/11 with persistent or recurrent SBO. Intractable nausea and vomiting-resolved. -Failed conservative measures with NG tube bowel decompression. -Exploratory laparotomy by Dr. Donne Hazel on 6/14 -Pulled his NG tube the night of 6/11-6/17 -Having BMs.  On CLD per general surgery. -Antiemetics and  IV fluid -Prealbumin 7.6.  Appreciate RD input. -Mobilize/OOB/PT/OT.   AKI on CKD-3B with azotemia in patient with history of membranous nephropathy: Baseline Cr 2.7-3.0 > 3.05 (admit)>> 7.58 (peak)>>> 1.90.  BUN 51 >> 106>>48>38.  About 1.0 L UOP/24 hrs.  Renal US without acute finding.   -On D5-NS at 75 cc an hour.  Also on clear liquid  diet. -Avoid nephrotoxic meds -Continue monitoring.  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  Hypernatremia: Na 149>> 141> 145.  -On sodium bicarb per nephrology.  Hypokalemia/hypomagnesemia: Likely due to GI loss.  K3.3. -Monitor and replenish as appropriate  Metabolic acidosis: Likely due to AKI and IV fluid.  Stable -Started on sodium bicarb by nephrology.  Fever: Spiked fever to 101.4 on 6/11, then 100.4 on 6/13.  No further fever or signs of infection.  Chronic diastolic CHF: Echo on 07/21/760 with EF of 55%, G2-DD and RVSP of 33.57mHg.  He is probably hypovolemic from GI loss.  No cardiopulmonary symptoms.  Net negative.  Followed by Dr. BHaroldine Lawsoutpatient. -Cardiology following. -Monitor fluid status while on IV fluid -Hold home diuretics  Pulmonary hypertension: Echo as above. RHC in 06/2018 with PA of 81/26 and PCWP of 17 -Resumed home Opsumit and Revatio  Scleroderma-stable.  Followed by Dr. DEstanislado Pandy-Continue home Norvasc, sildenafil  History of CAD s/p PCA-no anginal sx. 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 -Continue monitoring -May need Epo and iron infusion but defer this to nephrology  Productive cough: Improved -Mucolytic's and antitussive  Delirium -Reorientation and delirium precautions. -Discontinued lorazepam at wife's request.  Debility/physical deconditioning due to acute illness and hospitalization -PT/OT eval  Severe malnutrition:Body mass index is 18.37 kg/m.  Significant muscle mass and subcu fat loss.  Prealbumin 7.6. Nutrition Problem: Severe Malnutrition Etiology: chronic illness (CHF, scleroderma) Signs/Symptoms: moderate fat depletion, severe fat depletion, moderate muscle depletion, severe muscle depletion Interventions: Ensure Enlive (each  supplement provides 350kcal and 20 grams of protein), Magic cup, MVI   DVT prophylaxis: Subcu heparin Code Status: Full code Family Communication: Updated patient's wife at the bedside Status is: Inpatient  Remains inpatient appropriate because:IV treatments appropriate due to intensity of illness or inability to take PO, Inpatient level of care appropriate due to severity of illness and Ongoing emesis   Dispo: The patient is from: Home              Anticipated d/c is to: Home              Anticipated d/c date is: 3 days              Patient currently is not medically stable to d/c.       Consultants:  General surgery Gastroenterology Nephrology Cardiology  Sch Meds:  Scheduled Meds: . allopurinol  100 mg Oral Daily  . amLODipine  5 mg Oral Daily  . Chlorhexidine Gluconate Cloth  6 each Topical Daily  . dextrose  1 Tube Oral Once  . escitalopram  5 mg Oral Daily  . feeding supplement (ENSURE ENLIVE)  237 mL Oral TID BM  . heparin  5,000 Units Subcutaneous Q8H  . insulin aspart  0-9 Units Subcutaneous Q6H  . macitentan  10 mg Oral Daily  . multivitamin with minerals  1 tablet Oral Daily  . [START ON 10/11/2019] pantoprazole  40 mg Oral Daily  . sildenafil  40 mg Oral TID  . simvastatin  40 mg Oral Daily  . sodium chloride flush  3 mL Intravenous Once   Continuous Infusions: . dextrose 5 % and 0.9% NaCl 75 mL/hr at 10/10/19 1001   PRN Meds:.bisacodyl, guaiFENesin-dextromethorphan, HYDROmorphone (DILAUDID) injection, labetalol, methocarbamol, ondansetron **OR** ondansetron (ZOFRAN) IV, phenol, prochlorperazine  Antimicrobials: Anti-infectives (From admission, onward)   Start     Dose/Rate Route Frequency Ordered Stop   10/06/19 0745  ceFAZolin (ANCEF) IVPB 2g/100 mL premix        2 g 200 mL/hr over 30 Minutes Intravenous  Once 10/06/19 0735 10/06/19 1700   10/06/19 0600  cefoTEtan (CEFOTAN) 2 g in sodium chloride 0.9 % 100 mL IVPB        2 g 200 mL/hr over 30  Minutes Intravenous On call to O.R. 10/05/19 1275 10/07/19 0843       I have personally reviewed the following labs and images: CBC: Recent Labs  Lab 10/04/19 0633 10/04/19 0633 10/05/19 0833 10/05/19 0833 10/06/19 0237 10/06/19 0237 10/07/19 0334 10/08/19 0829 10/08/19 1635 10/09/19 0450 10/10/19 0434  WBC 9.5  --  7.0  --  7.5  --  10.6* 9.0  --   --   --   NEUTROABS  --   --   --   --   --   --   --   --   --  7.6  --   HGB 10.8*   < > 9.5*   < > 9.9*   < > 9.7* 8.3* 8.9* 8.7* 8.1*  HCT 34.4*   < > 30.6*   < > 31.9*   < > 32.5* 27.5* 29.8* 28.9* 27.3*  MCV 97.2  --  98.7  --  98.8  --  101.6* 101.5*  --   --   --   PLT 179  --  166  --  203  --  189 177  --   --   --    < > = values in this interval not displayed.   BMP &GFR Recent Labs  Lab 10/06/19 0237 10/07/19 0334 10/08/19 0829 10/09/19 0450 10/10/19 0434  NA 146* 145 145 145 139  K 3.5 3.9 3.3* 3.6 3.5  CL 112* 113* 114* 119* 108  CO2 23 19* 20* 18* 23  GLUCOSE 113* 132* 100* 76 75  BUN 81* 78* 62* 48* 38*  CREATININE 3.43* 3.39* 2.59* 2.25* 1.90*  CALCIUM 8.2* 7.9* 7.8* 7.8* 7.2*  MG 2.3 2.3 2.1 1.8 1.8  PHOS 4.9* 6.2* 3.4 2.4* 2.2*   Estimated Creatinine Clearance: 32 mL/min (A) (by C-G formula based on SCr of 1.9 mg/dL (H)). Liver & Pancreas: Recent Labs  Lab 10/06/19 0237 10/07/19 0334 10/08/19 0829 10/09/19 0450 10/10/19 0434  AST  --   --   --  20  --   ALT  --   --   --  9  --   ALKPHOS  --   --   --  70  --   BILITOT  --   --   --  0.5  --   PROT  --   --   --  5.2*  --   ALBUMIN 2.5* 2.1* 2.0* 2.1* 1.9*   No results for input(s): LIPASE, AMYLASE in the last 168 hours. No results for input(s): AMMONIA in the last 168 hours. Diabetic: Recent Labs    10/09/19 0450  HGBA1C 4.9  Recent Labs  Lab 10/10/19 0112 10/10/19 0139 10/10/19 0634 10/10/19 0704 10/10/19 1235  GLUCAP 48* 149* 56* 110* 82   Cardiac Enzymes: No results for input(s): CKTOTAL, CKMB, CKMBINDEX, TROPONINI  in the last 168 hours. No results for input(s): PROBNP in the last 8760 hours. Coagulation Profile: No results for input(s): INR, PROTIME in the last 168 hours. Thyroid Function Tests: No results for input(s): TSH, T4TOTAL, FREET4, T3FREE, THYROIDAB in the last 72 hours. Lipid Profile: Recent Labs    10/10/19 0434  TRIG 160*   Anemia Panel: No results for input(s): VITAMINB12, FOLATE, FERRITIN, TIBC, IRON, RETICCTPCT in the last 72 hours. Urine analysis:    Component Value Date/Time   COLORURINE YELLOW 10/03/2019 2300   APPEARANCEUR HAZY (A) 10/03/2019 2300   LABSPEC 1.016 10/03/2019 2300   PHURINE 5.0 10/03/2019 2300   GLUCOSEU NEGATIVE 10/03/2019 2300   HGBUR NEGATIVE 10/03/2019 2300   BILIRUBINUR SMALL (A) 10/03/2019 2300   KETONESUR NEGATIVE 10/03/2019 2300   PROTEINUR 100 (A) 10/03/2019 2300   NITRITE NEGATIVE 10/03/2019 2300   LEUKOCYTESUR NEGATIVE 10/03/2019 2300   Sepsis Labs: Invalid input(s): PROCALCITONIN, [place]  Microbiology: Recent Results (from the past 240 hour(s))  Surgical pcr screen     Status: None   Collection Time: 10/05/19 10:07 AM   Specimen: Nasal Mucosa; Nasal Swab  Result Value Ref Range Status   MRSA, PCR NEGATIVE NEGATIVE Final   Staphylococcus aureus NEGATIVE NEGATIVE Final    Comment: (NOTE) The Xpert SA Assay (FDA approved for NASAL specimens in patients 26 years of age and older), is one component of a comprehensive surveillance program. It is not intended to diagnose infection nor to guide or monitor treatment. Performed at [facility], [place] [address] [place], [place] [address]     Radiology Studies: No results found.    Jakira Mcfadden T. [place]  If 7PM-7AM, please contact night-coverage [email or web address] Password [facility] 10/10/2019, 2:22 PM

## 2019-10-10 NOTE — Progress Notes (Signed)
Nutrition Follow-up  DOCUMENTATION CODES:   Underweight, Severe malnutrition in context of chronic illness  INTERVENTION:   -D/c Boost Breeze po TID, each supplement provides 250 kcal and 9 grams of protein -Ensure Enlive po TID, each supplement provides 350 kcal and 20 grams of protein -MVI with minerals daily -RD will follow for diet advancement and adjust supplement regimen as appropriate  NUTRITION DIAGNOSIS:   Severe Malnutrition related to chronic illness (CHF, scleroderma) as evidenced by moderate fat depletion, severe fat depletion, moderate muscle depletion, severe muscle depletion.  Ongoing  GOAL:   Patient will meet greater than or equal to 90% of their needs  Progressing   MONITOR:   PO intake, Supplement acceptance, Diet advancement, Labs, Weight trends, Skin, I & O's  REASON FOR ASSESSMENT:   Consult Assessment of nutrition requirement/status  ASSESSMENT:   [name] is a 59 y.o. male with medical history significant of scleroderma, HTN, CAD, HLD, systolic CHF who presents to ED with complaints of 2 days hx of n/v, unable to tolerate PO. Patient recently had discussed with GI regarding coughing up blood and was planned for EGD and colonoscopy at a later date. Patient later called GI with complaints of frequent bilious emesis without coffee grounds. Patient was referred to ED for further work up.  6/6- NGT placed 6/9- NGT removed, advanced to full liquid diet 6/10- downgraded to clear liquid diet 6/11- NPO, NGT placed 6/14- s/pProcedure: Exploratory laparotomy with lysis of adhesions x1 hour 6/16- NGT d/c 6/17- advanced to clear liquid diet   Reviewed I/O's: -760 ml x 24 hours and -10 L since admission  UOP: 1 L x 24 hours  Pt sitting up in bed, resting quietly at time of visit. Pt more alert and interactive in comparison to last week.   He reports feeling better, 'I just had a BM about 30 minutes ago". Per general surgery notes, pt requesting  to stay on clear liquid diet for one more day, but allowing Ensure Enlive supplements. Pt denies any abdominal pain, nausea, vomiting, or diarrhea ("I was having problems, but it's all good now").   Discussed importance of good meal and supplement intake to promote healing. Encouraged pt continue to consume oral nutrition supplements.  Labs reviewed: CBGS: 56-149 (inpatient orders for glycemic control are 0-9 units insulin aspart every 6 hours).  Diet Order:   Diet Order            Diet clear liquid Room service appropriate? Yes; Fluid consistency: Thin  Diet effective now                 EDUCATION NEEDS:   Education needs have been addressed  Skin:  Skin Assessment: Skin Integrity Issues: Skin Integrity Issues:: Incisions Incisions: closed abdomen  Last BM:  10/10/19  Height:   Ht Readings from Last 1 Encounters:  10/06/19 5' 7.99" (1.727 m)    Weight:   Wt Readings from Last 1 Encounters:  10/10/19 54.8 kg    Ideal Body Weight:  63.6 kg  BMI:  Body mass index is 18.37 kg/m.  Estimated Nutritional Needs:   Kcal:  2000-2200  Protein:  105-110 grams  Fluid:  > 2 L    Loistine Chance, RD, LDN, [place] Registered Dietitian II Certified Diabetes Care and Education Specialist Please refer to [facility] for RD and/or RD on-call/weekend/after hours pager

## 2019-10-10 NOTE — Progress Notes (Signed)
Hypoglycemic Event  CBG: 48  Treatment: Initially 4oz juice and glutose oral gel x1  Symptoms: None  Follow-up CBG: Time:0155 CBG Result:48  Treatment: 25g D50  Symptoms: None  Follow-up CBG: Time:0210 CBG Result:149  Possible Reasons for Event: Patient has poor appetite and dislikes to intake highly concentrated sugars PO.   Sue Lush

## 2019-10-10 NOTE — Progress Notes (Signed)
Advanced Heart Failure Rounding Note  PCP-Cardiologist: [name], MD  [facility]: Dr. Haroldine Laws   Subjective:    Admitted w/ SBO. S/p exploratory laparotomy 6/14 w/ lysis of adhesions.    Frustrated. Wants to go home.   Now taking po meds. Having loose stools. Denies SOB, orthopnea or PND.   Objective:   Weight Range: 54.8 kg Body mass index is 18.37 kg/m.   Vital Signs:   Temp:  [98.5 F (36.9 C)-99.5 F (37.5 C)] 98.5 F (36.9 C) (06/18 1358) Pulse Rate:  [88-98] 89 (06/18 1358) Resp:  [15-18] 15 (06/18 1358) BP: (126-137)/(72-83) 126/76 (06/18 1358) SpO2:  [98 %-100 %] 98 % (06/18 1358) Weight:  [54.8 kg] 54.8 kg (06/18 0601) Last BM Date: 10/10/19  Weight change: Filed Weights   10/06/19 0833 10/09/19 0421 10/10/19 0601  Weight: 50 kg 54.7 kg 54.8 kg    Intake/Output:   Intake/Output Summary (Last 24 hours) at 10/10/2019 1629 Last data filed at 10/10/2019 1300 Gross per 24 hour  Intake 480 ml  Output 300 ml  Net 180 ml      Physical Exam    General:  Sitting up in bed  No resp difficulty HEENT: normal Neck: supple. no JVD. Carotids 2+ bilat; no bruits. No lymphadenopathy or thryomegaly appreciated. Cor: PMI nondisplaced. Regular rate & rhythm. No rubs, gallops or murmurs. Lungs: clear Abdomen: soft, nontender, nondistended. No hepatosplenomegaly. No bruits or masses. + surgical dressing hypoactive BS Extremities: no cyanosis, clubbing, rash, edema + scleroderma changes Neuro: alert & orientedx3, cranial nerves grossly intact. moves all 4 extremities w/o difficulty. Affect pleasant   Labs    CBC Recent Labs    10/08/19 0829 10/08/19 1635 10/09/19 0450 10/10/19 0434  WBC 9.0  --   --   --   NEUTROABS  --   --  7.6  --   HGB 8.3*   < > 8.7* 8.1*  HCT 27.5*   < > 28.9* 27.3*  MCV 101.5*  --   --   --   PLT 177  --   --   --    < > = values in this interval not displayed.   Basic Metabolic Panel Recent Labs    10/09/19 0450  10/10/19 0434  NA 145 139  K 3.6 3.5  CL 119* 108  CO2 18* 23  GLUCOSE 76 75  BUN 48* 38*  CREATININE 2.25* 1.90*  CALCIUM 7.8* 7.2*  MG 1.8 1.8  PHOS 2.4* 2.2*   Liver Function Tests Recent Labs    10/09/19 0450 10/10/19 0434  AST 20  --   ALT 9  --   ALKPHOS 70  --   BILITOT 0.5  --   PROT 5.2*  --   ALBUMIN 2.1* 1.9*   No results for input(s): LIPASE, AMYLASE in the last 72 hours. Cardiac Enzymes No results for input(s): CKTOTAL, CKMB, CKMBINDEX, TROPONINI in the last 72 hours.  BNP: BNP (last 3 results) Recent Labs    05/12/19 1026 06/11/19 1204 09/28/19 1310  BNP 297.0* 197.3* 184.7*    ProBNP (last 3 results) No results for input(s): PROBNP in the last 8760 hours.   D-Dimer No results for input(s): DDIMER in the last 72 hours. Hemoglobin A1C Recent Labs    10/09/19 0450  HGBA1C 4.9   Fasting Lipid Panel Recent Labs    10/10/19 0434  TRIG 160*   Thyroid Function Tests No results for input(s): TSH, T4TOTAL, T3FREE, THYROIDAB in the last  72 hours.  Invalid input(s): FREET3  Other results:   Imaging    No results found.   Medications:     Scheduled Medications: . allopurinol  100 mg Oral Daily  . amLODipine  5 mg Oral Daily  . Chlorhexidine Gluconate Cloth  6 each Topical Daily  . dextrose  1 Tube Oral Once  . escitalopram  5 mg Oral Daily  . feeding supplement (ENSURE ENLIVE)  237 mL Oral TID BM  . heparin  5,000 Units Subcutaneous Q8H  . insulin aspart  0-9 Units Subcutaneous Q6H  . macitentan  10 mg Oral Daily  . multivitamin with minerals  1 tablet Oral Daily  . [START ON 10/11/2019] pantoprazole  40 mg Oral Daily  . sildenafil  40 mg Oral TID  . simvastatin  40 mg Oral Daily  . sodium chloride flush  3 mL Intravenous Once    Infusions: . dextrose 5 % and 0.9% NaCl 75 mL/hr at 10/10/19 1001    PRN Medications: bisacodyl, guaiFENesin-dextromethorphan, HYDROmorphone (DILAUDID) injection, labetalol, methocarbamol,  ondansetron **OR** ondansetron (ZOFRAN) IV, phenol, prochlorperazine  Assessment/Plan    1. SBO - s/p exploratory laparotomy yesterday w/ lysis of adhesions. - Improving slowly. Now taking po  2. AKI on CKD IIIb - SCr peaked to 7.32 (baseline ~2.5) - 2/2 volume depletion/dehydration from SBO  - improving w/ IVFs. SCr down to 1.9 today (which is belowhis baseline) - nephrology following  3. 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 PA 81/26 (48), PCW 17, PVC 6.0 - Echo 02/03/19 showed moderately elevatedright ventricular systolic pressureat 82.9 mmHg. - Echo 1/202: right ventricular systolic pressure 56.2 mmHg. RV normal. LVEF 55%.  - on  sildenafil  40 mg TID PTA - on opsumit 10 mg daily PTA - PAH meds successfully restarted - off diuretics. Restart as needed   4. CAD - s/p previous RCA stent. - LHC 07/01/18: Non obstructive CAD with patent RCA stent. - Tolerated surgery well. No s/s ischemia - Off ASA with GIB.  - back on statin  5. Scleroderma - Follows with Dr. Estanislado Pandy  6.  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 Nilda Simmer developed large recurrent pericardial effusion w/o tamponade and moderate right sided pleural effusion requiring repeat intervention - s/p right VATSdrainage of his pleural effusion and pericardial window10/19/20. - Fluid analysis c/w transudative effusion  - Repeat echo in 1/21 showed only small effusion   Doing well from cardiac standpoint.Wil s/o. Please restart diuretic on d/c.   Call with questions.   Length of Stay: [place], MD  10/10/2019, 4:29 PM  Advanced Heart Failure Team Pager [phone] (M-F; 7a - 4p)  Please contact [facility] for night-coverage after hours (4p -7a ) and weekends on [email or web address]

## 2019-10-11 DIAGNOSIS — E878 Other disorders of electrolyte and fluid balance, not elsewhere classified: Secondary | ICD-10-CM

## 2019-10-11 LAB — RENAL FUNCTION PANEL
Albumin: 1.8 g/dL — ABNORMAL LOW (ref 3.5–5.0)
Anion gap: 6 (ref 5–15)
BUN: 28 mg/dL — ABNORMAL HIGH (ref 6–20)
CO2: 24 mmol/L (ref 22–32)
Calcium: 7.2 mg/dL — ABNORMAL LOW (ref 8.9–10.3)
Chloride: 111 mmol/L (ref 98–111)
Creatinine, Ser: 1.85 mg/dL — ABNORMAL HIGH (ref 0.61–1.24)
GFR calc Af Amer: 45 mL/min — ABNORMAL LOW (ref >60)
GFR calc non Af Amer: 39 mL/min — ABNORMAL LOW (ref >60)
Glucose, Bld: 94 mg/dL (ref 70–99)
Phosphorus: 2.1 mg/dL — ABNORMAL LOW (ref 2.5–4.6)
Potassium: 3.3 mmol/L — ABNORMAL LOW (ref 3.5–5.1)
Sodium: 141 mmol/L (ref 135–145)

## 2019-10-11 LAB — GLUCOSE, CAPILLARY
Glucose-Capillary: 71 mg/dL (ref 70–99)
Glucose-Capillary: 84 mg/dL (ref 70–99)
Glucose-Capillary: 89 mg/dL (ref 70–99)
Glucose-Capillary: 90 mg/dL (ref 70–99)
Glucose-Capillary: 92 mg/dL (ref 70–99)

## 2019-10-11 LAB — IRON AND TIBC
Iron: 22 ug/dL — ABNORMAL LOW (ref 45–182)
Saturation Ratios: 17 % — ABNORMAL LOW (ref 17.9–39.5)
TIBC: 133 ug/dL — ABNORMAL LOW (ref 250–450)
UIBC: 111 ug/dL

## 2019-10-11 LAB — HEMOGLOBIN AND HEMATOCRIT, BLOOD
HCT: 26.9 % — ABNORMAL LOW (ref 39.0–52.0)
Hemoglobin: 8 g/dL — ABNORMAL LOW (ref 13.0–17.0)

## 2019-10-11 LAB — MAGNESIUM: Magnesium: 1.5 mg/dL — ABNORMAL LOW (ref 1.7–2.4)

## 2019-10-11 MED ORDER — OXYCODONE HCL 5 MG PO TABS: 5.0000 mg | ORAL_TABLET | ORAL | 0 refills | Status: DC | PRN

## 2019-10-11 MED ORDER — POTASSIUM CHLORIDE 10 MEQ/100ML IV SOLN
10.0000 meq | INTRAVENOUS | Status: AC
  Administered 2019-10-11 (×3): 10 meq via INTRAVENOUS
  Filled 2019-10-11 (×3): qty 100

## 2019-10-11 MED ORDER — SODIUM PHOSPHATES 45 MMOLE/15ML IV SOLN
10.0000 mmol | Freq: Once | INTRAVENOUS | Status: AC
  Administered 2019-10-11: 10 mmol via INTRAVENOUS
  Filled 2019-10-11: qty 3.33

## 2019-10-11 MED ORDER — OXYCODONE HCL 5 MG PO TABS
5.0000 mg | ORAL_TABLET | ORAL | Status: DC | PRN
  Administered 2019-10-12: 5 mg via ORAL
  Filled 2019-10-11: qty 1

## 2019-10-11 MED ORDER — DARBEPOETIN ALFA 100 MCG/0.5ML IJ SOSY
100.0000 ug | PREFILLED_SYRINGE | INTRAMUSCULAR | Status: DC
  Administered 2019-10-11: 100 ug via SUBCUTANEOUS
  Filled 2019-10-11: qty 0.5

## 2019-10-11 MED ORDER — MAGNESIUM SULFATE 2 GM/50ML IV SOLN
2.0000 g | Freq: Once | INTRAVENOUS | Status: AC
  Administered 2019-10-11: 2 g via INTRAVENOUS
  Filled 2019-10-11: qty 50

## 2019-10-11 NOTE — Evaluation (Signed)
Occupational Therapy Evaluation Patient Details Name: [name] MRN: [id number] DOB: July 19, 1960 Today's Date: 10/11/2019    History of Present Illness Pt is a 59 year old man admitted on 09/28/19 with N/V due to SBO. NGT placed, removed 10/01/19 with resumption of vomiting. Hospital course complicated by mild hypernatremia and AKI. s/p exploratory laparatomy with lysis of adhersions 6/14. PMH: CHF, scleroderma, HTN CAD/PCI, malnutrition.   Clinical Impression   Pt referred for second time this admission. Initially, he was functioning at a supervision level, primarily to manage lines and for safety. Pt with weakness and impaired standing balance with prolonged hospitalization and hesitance to participate in OOB activity. Pt overall requiring set up to min assist for ADL and up to min assist for mobility. Encouraged OOB and use of incentive spirometer. Pt reports showering with wife's assist while here at hospital seated on chair. Recommending tub bench for home. Will follow while admitted.    Follow Up Recommendations  No OT follow up    Equipment Recommendations  Tub/shower bench    Recommendations for Other Services       Precautions / Restrictions Precautions Precautions: Fall      Mobility Bed Mobility Overal bed mobility: Needs Assistance Bed Mobility: Sidelying to Sit;Rolling;Sit to Sidelying Rolling: Supervision Sidelying to sit: Supervision;HOB elevated     Sit to sidelying: Min assist General bed mobility comments: instructed in log roll technique to minimize abdominal pain  Transfers Overall transfer level: Needs assistance Equipment used: 1 person hand held assist Transfers: Sit to/from Stand Sit to Stand: Min assist         General transfer comment: Min A for steadying assist to stand.     Balance Overall balance assessment: Needs assistance   Sitting balance-Leahy Scale: Good       Standing balance-Leahy Scale: Fair Standing balance comment:  statically                           ADL either performed or assessed with clinical judgement   ADL Overall ADL's : Needs assistance/impaired Eating/Feeding: Independent   Grooming: Wash/dry hands;Standing;Min guard   Upper Body Bathing: Supervision/ safety;Sitting   Lower Body Bathing: Minimal assistance;Sitting/lateral leans   Upper Body Dressing : Set up;Sitting   Lower Body Dressing: Minimal assistance;Sit to/from stand   Toilet Transfer: Minimal assistance;Ambulation   Toileting- Clothing Manipulation and Hygiene: Minimal assistance;Sit to/from Nurse, children's Details (indicate cue type and reason): recommended shower seat, wife agreed Functional mobility during ADLs: Minimal assistance;Min guard (hand held initially)       Vision Patient Visual Report: No change from baseline       Perception     Praxis      Pertinent Vitals/Pain Pain Assessment: Faces Faces Pain Scale: Hurts little more Pain Location: abdomen  Pain Descriptors / Indicators: Operative site guarding;Sore;Guarding;Grimacing Pain Intervention(s): Monitored during session;Repositioned     Hand Dominance Right   Extremity/Trunk Assessment Upper Extremity Assessment Upper Extremity Assessment: Generalized weakness   Lower Extremity Assessment Lower Extremity Assessment: Generalized weakness       Communication Communication Communication: No difficulties   Cognition Arousal/Alertness: Awake/alert Behavior During Therapy: Flat affect Overall Cognitive Status: Impaired/Different from baseline                           Safety/Judgement: Decreased awareness of deficits;Decreased awareness of safety     General Comments: continues to  need encouragement to participate, deferred to wife regarding need for DME at home   General Comments       Exercises Other Exercises Other Exercises: incentive spirometer x 5   Shoulder Instructions      Home  Living Family/patient expects to be discharged to:: Private residence Living Arrangements: Spouse/significant other;Children (62 year old daughter) Available Help at Discharge: Family;Available 24 hours/day Type of Home: House Home Access: Stairs to enter [employer] of Steps: 3-4 Entrance Stairs-Rails: Right Home Layout: One level     Bathroom Shower/Tub: Teacher, early years/pre: Handicapped height     Home Equipment: Bedside commode;Crutches;Other (comment) (walking sticks)   Additional Comments: wife is disabled      Prior Functioning/Environment Level of Independence: Independent        Comments: Does not work.        OT Problem List: Decreased strength;Impaired balance (sitting and/or standing);Decreased activity tolerance;Pain;Decreased knowledge of use of DME or AE      OT Treatment/Interventions: Self-care/ADL training;DME and/or AE instruction;Patient/family education;Balance training;Therapeutic activities    OT Goals(Current goals can be found in the care plan section) Acute Rehab OT Goals Patient Stated Goal: to go for a drive in his truck  OT Goal Formulation: With patient Time For Goal Achievement: 10/25/19 Potential to Achieve Goals: Good ADL Goals Pt Will Perform Tub/Shower Transfer: ambulating;Tub transfer;with supervision;tub bench  OT Frequency: Min 2X/week   Barriers to D/C:            Co-evaluation              AM-PAC OT "6 Clicks" Daily Activity     Outcome Measure Help from another person eating meals?: None Help from another person taking care of personal grooming?: A Little Help from another person toileting, which includes using toliet, bedpan, or urinal?: A Little Help from another person bathing (including washing, rinsing, drying)?: A Little Help from another person to put on and taking off regular upper body clothing?: A Little Help from another person to put on and taking off regular lower body  clothing?: A Little 6 Click Score: 19   End of Session    Activity Tolerance: Patient tolerated treatment well Patient left: in bed;with call bell/phone within reach;with bed alarm set  OT Visit Diagnosis: Muscle weakness (generalized) (M62.81)                Time: 4715-8063 OT Time Calculation (min): 23 min Charges:  OT General Charges $OT Visit: 1 Visit OT Evaluation $OT Eval Moderate Complexity: 1 Mod OT Treatments $Self Care/Home Management : 8-22 mins  Nestor Lewandowsky, OTR/L Acute Rehabilitation Services Pager: [phone] Office: [phone]  Malka So 10/11/2019, 2:22 PM

## 2019-10-11 NOTE — Discharge Instructions (Signed)
[facility]      [facility] °[phone] ° °OPEN ABDOMINAL SURGERY: POST OP INSTRUCTIONS ° °Always review your discharge instruction sheet given to you by the facility where your surgery was performed. ° °IF YOU HAVE DISABILITY OR FAMILY LEAVE FORMS, YOU MUST BRING THEM TO THE OFFICE FOR PROCESSING.  PLEASE DO NOT GIVE THEM TO YOUR DOCTOR. ° °1. A prescription for pain medication may be given to you upon discharge.  Take your pain medication as prescribed, if needed.  If narcotic pain medicine is not needed, then you may take acetaminophen (Tylenol) or ibuprofen (Advil) as needed. °2. Take your usually prescribed medications unless otherwise directed. °3. If you need a refill on your pain medication, please contact your pharmacy. They will contact our office to request authorization.  Prescriptions will not be filled after 5pm or on week-ends. °4. You should follow a light diet the first few days after arrival home, such as soup and crackers, pudding, etc.unless your doctor has advised otherwise. A high-fiber, low fat diet can be resumed as tolerated.   Be sure to include lots of fluids daily. Most patients will experience some swelling and bruising on the chest and neck area.  Ice packs will help.  Swelling and bruising can take several days to resolve °5. Most patients will experience some swelling and bruising in the area of the incision. Ice pack will help. Swelling and bruising can take several days to resolve..  °6. It is common to experience some constipation if taking pain medication after surgery.  Increasing fluid intake and taking a stool softener will usually help or prevent this problem from occurring.  A mild laxative (Milk of Magnesia or Miralax) should be taken according to package directions if there are no bowel movements after 48 hours. °7.  You may have steri-strips (small skin tapes) in place directly over the incision.  These strips should be left on the skin for 7-10 days.  If your  surgeon used skin glue on the incision, you may shower in 24 hours.  The glue will flake off over the next 2-3 weeks.  Any sutures or staples will be removed at the office during your follow-up visit. You may find that a light gauze bandage over your incision may keep your staples from being rubbed or pulled. You may shower and replace the bandage daily. °8. ACTIVITIES:  You may resume regular (light) daily activities beginning the next day--such as daily self-care, walking, climbing stairs--gradually increasing activities as tolerated.  You may have sexual intercourse when it is comfortable.  Refrain from any heavy lifting or straining until approved by your doctor. °a. You may drive when you no longer are taking prescription pain medication, you can comfortably wear a seatbelt, and you can safely maneuver your car and apply brakes °b. Return to Work: ___________________________________ °9. You should see your doctor in the office for a follow-up appointment approximately two weeks after your surgery.  Make sure that you call for this appointment within a day or two after you arrive home to insure a convenient appointment time. °OTHER INSTRUCTIONS:  °_____________________________________________________________ °_____________________________________________________________ ° °WHEN TO CALL YOUR DOCTOR: °1. Fever over 101.0 °2. Inability to urinate °3. Nausea and/or vomiting °4. Extreme swelling or bruising °5. Continued bleeding from incision. °6. Increased pain, redness, or drainage from the incision. °7. Difficulty swallowing or breathing °8. Muscle cramping or spasms. °9. Numbness or tingling in hands or feet or around lips. ° °The clinic staff is available to   answer your questions during regular business hours.  Please don’t hesitate to call and ask to speak to one of the nurses if you have concerns. ° °For further questions, please visit [email or web address] ° ° °

## 2019-10-11 NOTE — Progress Notes (Signed)
Patient ID: [name], male   DOB: October 31, 1960, 59 y.o.   MRN: [id number]    5 Days Post-Op  Subjective: Pain controlled.  No nausea. Tolerating CLD.  Still moving his bowels.  Feels like his abdomen is about normal size  ROS: See above, otherwise other systems negative  Objective: Vital signs in last 24 hours: Temp:  [98.5 F (36.9 C)-98.7 F (37.1 C)] 98.7 F (37.1 C) (06/19 0428) Pulse Rate:  [79-92] 79 (06/19 0428) Resp:  [15-16] 16 (06/19 0428) BP: (113-126)/(68-76) 113/72 (06/19 0428) SpO2:  [94 %-98 %] 96 % (06/19 0428) Last BM Date: 10/10/19  Intake/Output from previous day: 06/18 0701 - 06/19 0700 In: 1570.5 [P.O.:240; I.V.:1330.5] Out: 200 [Urine:200] Intake/Output this shift: Total I/O In: -  Out: 200 [Urine:200]  PE: Abd: less distended than yesterday, +BS, appropriately tender, midline incision is c/d/i with staples.  Honeycomb dressing removed.  Lab Results:  Recent Labs    10/10/19 0434 10/11/19 0316  HGB 8.1* 8.0*  HCT 27.3* 26.9*   BMET Recent Labs    10/10/19 0434 10/11/19 0316  NA 139 141  K 3.5 3.3*  CL 108 111  CO2 23 24  GLUCOSE 75 94  BUN 38* 28*  CREATININE 1.90* 1.85*  CALCIUM 7.2* 7.2*   PT/INR No results for input(s): LABPROT, INR in the last 72 hours. CMP     Component Value Date/Time   NA 141 10/11/2019 0316   NA 145 (H) 06/11/2018 0852   K 3.3 (L) 10/11/2019 0316   CL 111 10/11/2019 0316   CO2 24 10/11/2019 0316   GLUCOSE 94 10/11/2019 0316   BUN 28 (H) 10/11/2019 0316   BUN 34 (H) 06/11/2018 0852   CREATININE 1.85 (H) 10/11/2019 0316   CREATININE 2.01 (H) 03/23/2017 1527   CALCIUM 7.2 (L) 10/11/2019 0316   PROT 5.2 (L) 10/09/2019 0450   ALBUMIN 1.8 (L) 10/11/2019 0316   AST 20 10/09/2019 0450   ALT 9 10/09/2019 0450   ALKPHOS 70 10/09/2019 0450   BILITOT 0.5 10/09/2019 0450   GFRNONAA 39 (L) 10/11/2019 0316   GFRNONAA 36 (L) 03/23/2017 1527   GFRAA 45 (L) 10/11/2019 0316   GFRAA 41 (L) 03/23/2017 1527    Lipase     Component Value Date/Time   LIPASE 40 09/28/2019 1310       Studies/Results: No results found.  Anti-infectives: Anti-infectives (From admission, onward)   Start     Dose/Rate Route Frequency Ordered Stop   10/06/19 0745  ceFAZolin (ANCEF) IVPB 2g/100 mL premix        2 g 200 mL/hr over 30 Minutes Intravenous  Once 10/06/19 0735 10/06/19 1700   10/06/19 0600  cefoTEtan (CEFOTAN) 2 g in sodium chloride 0.9 % 100 mL IVPB        2 g 200 mL/hr over 30 Minutes Intravenous On call to O.R. 10/05/19 0810 10/07/19 0843       Assessment/Plan HTN HLD CAD  Hypothyroidism ARF- Cr1.9 - Per [facility] - PCM - prealbumin 7.6 TNA to start after line placed  SBO S/p exploratory laparotomy and LOA 10/06/19 Dr. Donne Hazel - POD#5 -soft diet today.  If he tolerates this, he is surgically stable for DC home -follow staple removal and surgeon follow up is being arranged by our office and will call and give him his appt dates and times. -stop IV dilaudid and convert to oxy for pain control.  This will be sent to his pharmacy for when is stable for  discharge. - PT/OT, mobilize   FEN -soft diet VTE -SCDs,SQheparin  ID -none Foley -out   LOS: 13 days    Henreitta Cea , [facility] 10/11/2019, 10:14 AM Please see Amion for pager number during day hours 7:00am-4:30pm or 7:00am -11:30am on weekends

## 2019-10-11 NOTE — Progress Notes (Signed)
PROGRESS NOTE  [name] OHY:[id number] DOB: [birth year]   PCP: [name], MD  Patient is from: Home.  DOA: 09/28/2019 LOS: 71  Brief Narrative / Interim history: 59 year old male with history of systolic CHF, scleroderma, HTN, CAD/PCI, HLD and malnutrition presenting with 2 days of nausea and vomiting and found to have small bowel obstruction.  He was managed conservatively with NG tube bowel decompression and IV fluid with improvement in symptoms.  NG tube discontinued on 10/01/2019.  He was cleared for discharge by general surgery from surgical standpoint. However, patient has significant emesis after NG tube removal.  General surgery recommended reinserting NGT.  Initially, patient refused NG tube replacement but he continued to have nausea and vomiting without bowel movement.  KUB without signs of SBO.  Attempted water enema without significant results continue to have intractable nausea and vomiting.  General surgery reconsulted on 6/11.  NG tube replaced.  CT abdomen and pelvis ordered.  Lumbar GI consulted as well for possible ileus/constipation.  Repeat CT abdomen and pelvis with persistent or recurrent SBO.  Cardiology consulted for preop eval in case he needs surgery.  Patient underwent ex lap with lysis of the adhesion by Dr. Donne Hazel on 10/06/2019.  He pulled out NG tube the night of 6/16.  Initially the plan was to initiate TPN but patient had massive BM on 6/17.  Started on clear liquid diet.  Hospital course complicated by progressive AKI with significant azotemia. Nephrology consulted.  Renal ultrasound without acute finding.  Received IV fluid boluses with increased maintenance rate.  AKI resolved.   Subjective: Seen and examined earlier this morning.  No major events overnight of this morning.  Had multiple bowel movements yesterday last night.  Denies chest pain, dyspnea, nausea, vomiting.  Tolerating clear liquid diet.  Objective: Vitals:   10/10/19 1358 10/10/19  1937 10/11/19 0428 10/11/19 0900  BP: 126/76 115/68 113/72 125/73  Pulse: 89 92 79 92  Resp: _0 Temp: 98.5 F (36.9 C) 98.5 F (36.9 C) 98.7 F (37.1 C) 98.8 F (37.1 C)  TempSrc: Oral Oral Oral Oral  SpO2: 98% 94% 96% 97%  Weight:      Height:        Intake/Output Summary (Last 24 hours) at 10/11/2019 1415 Last data filed at 10/11/2019 0750 Gross per 24 hour  Intake 1330.5 ml  Output 400 ml  Net 930.5 ml   Filed Weights   10/06/19 0833 10/09/19 0421 10/10/19 0601  Weight: 50 kg 54.7 kg 54.8 kg    Examination:  GENERAL: Frail looking male.  No apparent distress.  Nontoxic. HEENT: MMM.  Vision and hearing grossly intact.  NECK: Supple.  No apparent JVD.  RESP:  No IWOB.  Fair aeration bilaterally. CVS:  RRR. Heart sounds normal.  ABD/GI/GU: BS+. Abd soft, NTND.  Dressing DCI. MSK/EXT:  Moves extremities. No apparent deformity. No edema.  SKIN: Honeycomb dressing over abdominal wound DCI. NEURO: Awake, alert and oriented appropriately.  No apparent focal neuro deficit. PSYCH: Calm. Normal affect.   Procedures:  6/6-6/9 NG tube for SBO 6/11-NG tube replaced. 6/14-exploratory laparotomy with lysis of adhesion by Dr. Donne Hazel  Microbiology summarized: COVID-19 PCR negative.  Assessment & Plan: Small bowel obstruction-repeat CT abdomen and pelvis on 6/11 with persistent or recurrent SBO. Intractable nausea and vomiting-resolved. -Failed conservative measures with NG tube bowel decompression. -Exploratory laparotomy by Dr. Donne Hazel on 6/14 -Pulled his NG tube the night of 6/11-6/17 -Having BMs.  On soft diet now.  He is  at risk for refeeding syndrome -Antiemetics and IV fluid -Prealbumin 7.6.  Appreciate RD input. -Mobilize/OOB/PT/OT.   AKI on CKD-3B with azotemia in patient with history of membranous nephropathy: Baseline Cr 2.7-3.0 > 3.05 (admit)>> 7.58 (peak)>>> 1.85.  BUN 51 >> 106>>48> 28. Renal US without acute finding.   -On D5-NS at 75 cc an  hour.  Also on soft diet. -Avoid nephrotoxic meds -Continue monitoring.  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  Hypernatremia: Na 149>> 141.   Hypokalemia/hypomagnesemia/hypophosphatemia: Refeeding syndrome?  K3.3.  Mg 1.5 -Replenish and recheck  Metabolic acidosis: Likely due to AKI and IV fluid.  Resolved.  Fever: Spiked fever to 101.4 on 6/11, then 100.4 on 6/13.  No further fever or signs of infection.  Chronic diastolic CHF: Echo on 4/53/6468 with EF of 55%, G2-DD and RVSP of 33.61mHg.  He is probably hypovolemic from GI loss.  No cardiopulmonary symptoms.  Net negative.  Followed by Dr. BHaroldine Lawsoutpatient. -Cardiology following. -Monitor fluid status while on IV fluid -Diuretics on hold.  Pulmonary hypertension: Echo as above. RHC in 06/2018 with PA of 81/26 and PCWP of 17 -Resumed home Opsumit and Revatio  Scleroderma-stable.  Followed by Dr. DEstanislado Pandy-Continue home Norvasc, sildenafil  History of CAD s/p PCA-no anginal sx. 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 -On Aranesp and IV iron per nephrology -Continue monitoring  Productive cough: Improved -Mucolytic's and antitussive  Delirium: Resolved. -Reorientation and delirium precautions. -Discontinued lorazepam at wife's request.  Debility/physical deconditioning due to acute illness and hospitalization -PT/OT eval  Severe malnutrition:Body mass index is 18.37 kg/m.  Significant muscle mass and subcu fat loss.  Prealbumin 7.6. Nutrition Problem: Severe Malnutrition Etiology: chronic illness (CHF, scleroderma) Signs/Symptoms: moderate fat depletion, severe fat depletion, moderate muscle depletion, severe muscle depletion Interventions: Ensure Enlive (each supplement provides 350kcal and 20 grams of protein), Magic cup,  MVI   DVT prophylaxis: Subcu heparin Code Status: Full code Family Communication: Updated patient's wife over the phone on 6/18.  None at bedside today. Status is: Inpatient  Remains inpatient appropriate because:Persistent severe electrolyte disturbances, IV treatments appropriate due to intensity of illness or inability to take PO, Inpatient level of care appropriate due to severity of illness and Close monitoring for refeeding syndrome   Dispo: The patient is from: Home              Anticipated d/c is to: Home              Anticipated d/c date is: 2 days              Patient currently is not medically stable to d/c.       Consultants:  General surgery Gastroenterology Nephrology Cardiology  Sch Meds:  Scheduled Meds: . allopurinol  100 mg Oral Daily  . amLODipine  5 mg Oral Daily  . Chlorhexidine Gluconate Cloth  6 each Topical Daily  . darbepoetin (ARANESP) injection - NON-DIALYSIS  100 mcg Subcutaneous Q Sat-1800  . dextrose  1 Tube Oral Once  . escitalopram  5 mg Oral Daily  . feeding supplement (ENSURE ENLIVE)  237 mL Oral TID BM  . heparin  5,000 Units Subcutaneous Q8H  . insulin aspart  0-9 Units Subcutaneous Q6H  . macitentan  10 mg Oral Daily  . multivitamin  with minerals  1 tablet Oral Daily  . pantoprazole  40 mg Oral Daily  . sildenafil  40 mg Oral TID  . simvastatin  40 mg Oral Daily  . sodium chloride flush  3 mL Intravenous Once   Continuous Infusions: . dextrose 5 % and 0.9% NaCl 75 mL/hr at 10/11/19 1155  . magnesium sulfate bolus IVPB    . sodium phosphate  Dextrose 5% IVPB 10 mmol (10/11/19 1154)   PRN Meds:.bisacodyl, guaiFENesin-dextromethorphan, labetalol, methocarbamol, ondansetron **OR** ondansetron (ZOFRAN) IV, oxyCODONE, phenol, prochlorperazine  Antimicrobials: Anti-infectives (From admission, onward)   Start     Dose/Rate Route Frequency Ordered Stop   10/06/19 0745  ceFAZolin (ANCEF) IVPB 2g/100 mL premix        2 g 200 mL/hr over  30 Minutes Intravenous  Once 10/06/19 0735 10/06/19 1700   10/06/19 0600  cefoTEtan (CEFOTAN) 2 g in sodium chloride 0.9 % 100 mL IVPB        2 g 200 mL/hr over 30 Minutes Intravenous On call to O.R. 10/05/19 5974 10/07/19 0843       I have personally reviewed the following labs and images: CBC: Recent Labs  Lab 10/05/19 0833 10/05/19 0833 10/06/19 0237 10/06/19 0237 10/07/19 0334 10/07/19 0334 10/08/19 0829 10/08/19 1635 10/09/19 0450 10/10/19 0434 10/11/19 0316  WBC 7.0  --  7.5  --  10.6*  --  9.0  --   --   --   --   NEUTROABS  --   --   --   --   --   --   --   --  7.6  --   --   HGB 9.5*   < > 9.9*   < > 9.7*   < > 8.3* 8.9* 8.7* 8.1* 8.0*  HCT 30.6*   < > 31.9*   < > 32.5*   < > 27.5* 29.8* 28.9* 27.3* 26.9*  MCV 98.7  --  98.8  --  101.6*  --  101.5*  --   --   --   --   PLT 166  --  203  --  189  --  177  --   --   --   --    < > = values in this interval not displayed.   BMP &GFR Recent Labs  Lab 10/07/19 0334 10/08/19 0829 10/09/19 0450 10/10/19 0434 10/11/19 0316 10/11/19 0812  NA 145 145 145 139 141  --   K 3.9 3.3* 3.6 3.5 3.3*  --   CL 113* 114* 119* 108 111  --   CO2 19* 20* 18* 23 24  --   GLUCOSE 132* 100* 76 75 94  --   BUN 78* 62* 48* 38* 28*  --   CREATININE 3.39* 2.59* 2.25* 1.90* 1.85*  --   CALCIUM 7.9* 7.8* 7.8* 7.2* 7.2*  --   MG 2.3 2.1 1.8 1.8  --  1.5*  PHOS 6.2* 3.4 2.4* 2.2* 2.1*  --    Estimated Creatinine Clearance: 32.9 mL/min (A) (by C-G formula based on SCr of 1.85 mg/dL (H)). Liver & Pancreas: Recent Labs  Lab 10/07/19 0334 10/08/19 0829 10/09/19 0450 10/10/19 0434 10/11/19 0316  AST  --   --  20  --   --   ALT  --   --  9  --   --   ALKPHOS  --   --  70  --   --   BILITOT  --   --  0.5  --   --   PROT  --   --  5.2*  --   --   ALBUMIN 2.1* 2.0* 2.1* 1.9* 1.8*   No results for input(s): LIPASE, AMYLASE in the last 168 hours. No results for input(s): AMMONIA in the last 168 hours. Diabetic: Recent Labs     10/09/19 0450  HGBA1C 4.9   Recent Labs  Lab 10/10/19 1235 10/10/19 1628 10/10/19 2334 10/11/19 0621 10/11/19 1315  GLUCAP 82 73 84 92 89   Cardiac Enzymes: No results for input(s): CKTOTAL, CKMB, CKMBINDEX, TROPONINI in the last 168 hours. No results for input(s): PROBNP in the last 8760 hours. Coagulation Profile: No results for input(s): INR, PROTIME in the last 168 hours. Thyroid Function Tests: No results for input(s): TSH, T4TOTAL, FREET4, T3FREE, THYROIDAB in the last 72 hours. Lipid Profile: Recent Labs    10/10/19 0434  TRIG 160*   Anemia Panel: Recent Labs    10/11/19 0812  TIBC 133*  IRON 22*   Urine analysis:    Component Value Date/Time   COLORURINE YELLOW 10/03/2019 2300   APPEARANCEUR HAZY (A) 10/03/2019 2300   LABSPEC 1.016 10/03/2019 2300   PHURINE 5.0 10/03/2019 2300   GLUCOSEU NEGATIVE 10/03/2019 2300   HGBUR NEGATIVE 10/03/2019 2300   BILIRUBINUR SMALL (A) 10/03/2019 2300   KETONESUR NEGATIVE 10/03/2019 2300   PROTEINUR 100 (A) 10/03/2019 2300   NITRITE NEGATIVE 10/03/2019 2300   LEUKOCYTESUR NEGATIVE 10/03/2019 2300   Sepsis Labs: Invalid input(s): PROCALCITONIN, [place]  Microbiology: Recent Results (from the past 240 hour(s))  Surgical pcr screen     Status: None   Collection Time: 10/05/19 10:07 AM   Specimen: Nasal Mucosa; Nasal Swab  Result Value Ref Range Status   MRSA, PCR NEGATIVE NEGATIVE Final   Staphylococcus aureus NEGATIVE NEGATIVE Final    Comment: (NOTE) The Xpert SA Assay (FDA approved for NASAL specimens in patients 45 years of age and older), is one component of a comprehensive surveillance program. It is not intended to diagnose infection nor to guide or monitor treatment. Performed at [facility], [place] [address] [place], [place] [address]     Radiology Studies: No results found.    Sherian Valenza T. [place]  If 7PM-7AM, please contact night-coverage [email or web address] Password  [email or web address] 10/11/2019, 2:15 PM

## 2019-10-11 NOTE — Progress Notes (Signed)
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 Adin Hector.  Baseline creatinine 2.5 mg/dL.  Renal ultrasound revealed no evidence of obstruction.  Maintenance IV fluids have been given 1.5 L normal saline bolus administered as patient appears to be dry and cachectic.    Status post lysis of adhesions with laparotomy 10/06/2019  Blood pressure 113/72 pulse 79 temperature 98.7 O2 sats 96% room air  Urine output 500 cc 10/10/2019 weight stable 54.8 kg.  Sodium 141 potassium 3.3 chloride 111 CO2 24 BUN 28 creatinine 1.85 glucose 94 calcium 7.2 phosphorus 2.1 albumin 1.8.  Hemoglobin 8.0  IV D5 1/2 normal saline 75 cc an hour   Objective:  Vital signs in last 24 hours:  Temp:  [98.5 F (36.9 C)-98.9 F (37.2 C)] 98.7 F (37.1 C) (06/19 0428) Pulse Rate:  [79-92] 79 (06/19 0428) Resp:  [15-16] 16 (06/19 0428) BP: (113-128)/(68-76) 113/72 (06/19 0428) SpO2:  [94 %-98 %] 96 % (06/19 0428)  Weight change:  Filed Weights   10/06/19 0833 10/09/19 0421 10/10/19 0601  Weight: 50 kg 54.7 kg 54.8 kg    Intake/Output: I/O last 3 completed shifts: In: 1810.5 [P.O.:480; I.V.:1330.5] Out: 500 [Urine:500]   Intake/Output this shift:  No intake/output data recorded.  General: Cachectic older looking male lying on bed with NG tube in place Heart:RRR, s1s2 nl, no rubs Lungs: Clear b/l, no crackle Abdomen:soft, Non-tender, non-distended Extremities:No LE edema Neurology: Alert, awake and following commands.  No asterixis   Basic Metabolic Panel: Recent Labs  Lab 10/06/19 0237 10/06/19 0237 10/07/19 0334 10/07/19 0334 10/08/19 0829 10/08/19 0829 10/09/19 0450 10/10/19 0434 10/11/19 0316  NA 146*   < > 145  --  145  --  145  139 141  K 3.5   < > 3.9  --  3.3*  --  3.6 3.5 3.3*  CL 112*   < > 113*  --  114*  --  119* 108 111  CO2 23   < > 19*  --  20*  --  18* 23 24  GLUCOSE 113*   < > 132*  --  100*  --  76 75 94  BUN 81*   < > 78*  --  62*  --  48* 38* 28*  CREATININE 3.43*   < > 3.39*  --  2.59*  --  2.25* 1.90* 1.85*  CALCIUM 8.2*   < > 7.9*   < > 7.8*   < > 7.8* 7.2* 7.2*  MG 2.3  --  2.3  --  2.1  --  1.8 1.8  --   PHOS 4.9*   < > 6.2*  --  3.4  --  2.4* 2.2* 2.1*   < > = values in this interval not displayed.    Liver Function Tests: Recent Labs  Lab 10/07/19 0334 10/08/19 0829 10/09/19 0450 10/10/19 0434 10/11/19 0316  AST  --   --  20  --   --   ALT  --   --  9  --   --   ALKPHOS  --   --  70  --   --   BILITOT  --   --  0.5  --   --   PROT  --   --  5.2*  --   --  ALBUMIN 2.1* 2.0* 2.1* 1.9* 1.8*   No results for input(s): LIPASE, AMYLASE in the last 168 hours. No results for input(s): AMMONIA in the last 168 hours.  CBC: Recent Labs  Lab 10/05/19 0833 10/05/19 0833 10/06/19 0237 10/06/19 0237 10/07/19 0334 10/07/19 0334 10/08/19 0829 10/08/19 1635 10/09/19 0450 10/10/19 0434 10/11/19 0316  WBC 7.0  --  7.5  --  10.6*  --  9.0  --   --   --   --   NEUTROABS  --   --   --   --   --   --   --   --  7.6  --   --   HGB 9.5*   < > 9.9*   < > 9.7*   < > 8.3* 8.9* 8.7* 8.1* 8.0*  HCT 30.6*   < > 31.9*   < > 32.5*   < > 27.5* 29.8* 28.9* 27.3* 26.9*  MCV 98.7  --  98.8  --  101.6*  --  101.5*  --   --   --   --   PLT 166  --  203  --  189  --  177  --   --   --   --    < > = values in this interval not displayed.    Cardiac Enzymes: No results for input(s): CKTOTAL, CKMB, CKMBINDEX, TROPONINI in the last 168 hours.  BNP: Invalid input(s): POCBNP  CBG: Recent Labs  Lab 10/10/19 0704 10/10/19 1235 10/10/19 1628 10/10/19 2334 10/11/19 0621  GLUCAP 110* 82 73 84 92    Microbiology: Results for orders placed or performed during the hospital encounter of 09/28/19  SARS  Coronavirus 2 by RT PCR (hospital order, performed in [facility] hospital lab) Nasopharyngeal Nasopharyngeal Swab     Status: None   Collection Time: 09/28/19  4:59 PM   Specimen: Nasopharyngeal Swab  Result Value Ref Range Status   SARS Coronavirus 2 NEGATIVE NEGATIVE Final    Comment: (NOTE) SARS-CoV-2 target nucleic acids are NOT DETECTED. The SARS-CoV-2 RNA is generally detectable in upper and lower respiratory specimens during the acute phase of infection. The lowest concentration of SARS-CoV-2 viral copies this assay can detect is 250 copies / mL. A negative result does not preclude SARS-CoV-2 infection and should not be used as the sole basis for treatment or other patient management decisions.  A negative result may occur with improper specimen collection / handling, submission of specimen other than nasopharyngeal swab, presence of viral mutation(s) within the areas targeted by this assay, and inadequate number of viral copies (<250 copies / mL). A negative result must be combined with clinical observations, patient history, and epidemiological information. Fact Sheet for Patients:   StrictlyIdeas.no Fact Sheet for Healthcare Providers: BankingDealers.co.za This test is not yet approved or cleared  by the [place] FDA and has been authorized for detection and/or diagnosis of SARS-CoV-2 by FDA under an Emergency Use Authorization (EUA).  This EUA will remain in effect (meaning this test can be used) for the duration of the COVID-19 declaration under Section 564(b)(1) of the Act, 21 U.S.C. section 360bbb-3(b)(1), unless the authorization is terminated or revoked sooner. Performed at [facility], [place] [address] [place], [place] [address]   Surgical pcr screen     Status: None   Collection Time: 10/05/19 10:07 AM   Specimen: Nasal Mucosa; Nasal Swab  Result Value Ref Range Status   MRSA, PCR NEGATIVE NEGATIVE Final  Staphylococcus aureus NEGATIVE NEGATIVE Final    Comment: (NOTE) The Xpert SA Assay (FDA approved for NASAL specimens in patients 46 years of age and older), is one component of a comprehensive surveillance program. It is not intended to diagnose infection nor to guide or monitor treatment. Performed at [facility], [place] [address] [place], [place] [address]     Coagulation Studies: No results for input(s): LABPROT, INR in the last 72 hours.  Urinalysis: No results for input(s): COLORURINE, LABSPEC, PHURINE, GLUCOSEU, HGBUR, BILIRUBINUR, KETONESUR, PROTEINUR, UROBILINOGEN, NITRITE, LEUKOCYTESUR in the last 72 hours.  Invalid input(s): APPERANCEUR    Imaging: No results found.   Medications:    dextrose 5 % and 0.9% NaCl 75 mL/hr at 10/11/19 0400    allopurinol  100 mg Oral Daily   amLODipine  5 mg Oral Daily   Chlorhexidine Gluconate Cloth  6 each Topical Daily   dextrose  1 Tube Oral Once   escitalopram  5 mg Oral Daily   feeding supplement (ENSURE ENLIVE)  237 mL Oral TID BM   heparin  5,000 Units Subcutaneous Q8H   insulin aspart  0-9 Units Subcutaneous Q6H   macitentan  10 mg Oral Daily   multivitamin with minerals  1 tablet Oral Daily   pantoprazole  40 mg Oral Daily   sildenafil  40 mg Oral TID   simvastatin  40 mg Oral Daily   sodium chloride flush  3 mL Intravenous Once   bisacodyl, guaiFENesin-dextromethorphan, HYDROmorphone (DILAUDID) injection, labetalol, methocarbamol, ondansetron **OR** ondansetron (ZOFRAN) IV, phenol, prochlorperazine  Assessment/ Plan:   Acute kidney injury setting of baseline chronic kidney disease stage IV.  Baseline creatinine appears about 2.5.  He has membranous nephropathy per biopsy.  Renal ultrasound does not reveal any evidence of hydronephrosis.  Patient appeared to be cachectic and dry on admission.  Received IV fluids.   We will decrease rate to 75 cc an hour.  He continues to make urine with 750 cc of  urine 10/09/2019. .  We will continue to follow as renal function slowly improves.  It appears that renal functions returned to baseline.  Mild metabolic acidosis seems to have improved.  Small bowel obstruction conservative management initially underwent exploratory laparotomy with lysis of adhesions 10/06/2018.  Hypertension/volume continue to follow blood pressures acceptable.     Anemia received IV iron and Aranesp 10/03/2019.  Will redose with Aranesp and check iron levels.  Hypernatremia mild we will continue to follow  Hypokalemia will replete.  Hypophosphatemia will replete 10 mEq   LOS: [place] _0 _1 :32 AM

## 2019-10-12 DIAGNOSIS — E870 Hyperosmolality and hypernatremia: Secondary | ICD-10-CM

## 2019-10-12 DIAGNOSIS — M349 Systemic sclerosis, unspecified: Secondary | ICD-10-CM

## 2019-10-12 DIAGNOSIS — I2510 Atherosclerotic heart disease of native coronary artery without angina pectoris: Secondary | ICD-10-CM

## 2019-10-12 DIAGNOSIS — R509 Fever, unspecified: Secondary | ICD-10-CM

## 2019-10-12 DIAGNOSIS — I5032 Chronic diastolic (congestive) heart failure: Secondary | ICD-10-CM

## 2019-10-12 DIAGNOSIS — R05 Cough: Secondary | ICD-10-CM

## 2019-10-12 DIAGNOSIS — E43 Unspecified severe protein-calorie malnutrition: Secondary | ICD-10-CM

## 2019-10-12 DIAGNOSIS — R5381 Other malaise: Secondary | ICD-10-CM

## 2019-10-12 DIAGNOSIS — E876 Hypokalemia: Secondary | ICD-10-CM

## 2019-10-12 DIAGNOSIS — D631 Anemia in chronic kidney disease: Secondary | ICD-10-CM

## 2019-10-12 DIAGNOSIS — Z681 Body mass index (BMI) 19 or less, adult: Secondary | ICD-10-CM

## 2019-10-12 DIAGNOSIS — D509 Iron deficiency anemia, unspecified: Secondary | ICD-10-CM

## 2019-10-12 DIAGNOSIS — I272 Pulmonary hypertension, unspecified: Secondary | ICD-10-CM

## 2019-10-12 DIAGNOSIS — E872 Acidosis: Secondary | ICD-10-CM

## 2019-10-12 DIAGNOSIS — N1832 Chronic kidney disease, stage 3b: Secondary | ICD-10-CM

## 2019-10-12 LAB — RENAL FUNCTION PANEL
Albumin: 1.8 g/dL — ABNORMAL LOW (ref 3.5–5.0)
Anion gap: 7 (ref 5–15)
BUN: 21 mg/dL — ABNORMAL HIGH (ref 6–20)
CO2: 22 mmol/L (ref 22–32)
Calcium: 7.2 mg/dL — ABNORMAL LOW (ref 8.9–10.3)
Chloride: 113 mmol/L — ABNORMAL HIGH (ref 98–111)
Creatinine, Ser: 1.82 mg/dL — ABNORMAL HIGH (ref 0.61–1.24)
GFR calc Af Amer: 46 mL/min — ABNORMAL LOW (ref >60)
GFR calc non Af Amer: 39 mL/min — ABNORMAL LOW (ref >60)
Glucose, Bld: 99 mg/dL (ref 70–99)
Phosphorus: 2.5 mg/dL (ref 2.5–4.6)
Potassium: 3.2 mmol/L — ABNORMAL LOW (ref 3.5–5.1)
Sodium: 142 mmol/L (ref 135–145)

## 2019-10-12 LAB — MAGNESIUM: Magnesium: 1.9 mg/dL (ref 1.7–2.4)

## 2019-10-12 LAB — GLUCOSE, CAPILLARY
Glucose-Capillary: 86 mg/dL (ref 70–99)
Glucose-Capillary: 90 mg/dL (ref 70–99)

## 2019-10-12 LAB — HEMOGLOBIN AND HEMATOCRIT, BLOOD
HCT: 25.7 % — ABNORMAL LOW (ref 39.0–52.0)
Hemoglobin: 7.6 g/dL — ABNORMAL LOW (ref 13.0–17.0)

## 2019-10-12 MED ORDER — ENSURE ENLIVE PO LIQD: 237.0000 mL | Freq: Three times a day (TID) | ORAL | 12 refills | Status: DC

## 2019-10-12 MED ORDER — SODIUM CHLORIDE 0.9 % IV SOLN
250.0000 mg | Freq: Every day | INTRAVENOUS | Status: DC
  Administered 2019-10-12: 250 mg via INTRAVENOUS
  Filled 2019-10-12: qty 20

## 2019-10-12 MED ORDER — MAGNESIUM SULFATE IN D5W 1-5 GM/100ML-% IV SOLN
1.0000 g | Freq: Once | INTRAVENOUS | Status: AC
  Administered 2019-10-12: 1 g via INTRAVENOUS
  Filled 2019-10-12: qty 100

## 2019-10-12 MED ORDER — POTASSIUM CHLORIDE CRYS ER 20 MEQ PO TBCR: 40.0000 meq | EXTENDED_RELEASE_TABLET | Freq: Two times a day (BID) | ORAL | 1 refills | Status: DC

## 2019-10-12 MED ORDER — POTASSIUM CHLORIDE CRYS ER 20 MEQ PO TBCR
40.0000 meq | EXTENDED_RELEASE_TABLET | ORAL | Status: DC
  Administered 2019-10-12: 40 meq via ORAL
  Filled 2019-10-12: qty 2

## 2019-10-12 NOTE — Care Management (Signed)
DME Shower stool ordered from [employer] to be delivered to room prior to d/c.

## 2019-10-12 NOTE — Progress Notes (Signed)
Pt IV removed, AVS reviewed, all questions answered to pt and family satisfaction. Pt being bathed by family now and is getting dressed. All belongings gathered and pt is ready for d/c home.

## 2019-10-12 NOTE — Discharge Summary (Signed)
Physician Discharge Summary  [name] KXF:[id number] DOB: 1960/09/14 DOA: 09/28/2019  PCP: [name], MD  Admit date: 09/28/2019 Discharge date: 10/12/2019  Admitted From: home Disposition:  home  Recommendations for Outpatient Follow-up:  1. Follow up with PCP in 1-2 weeks 2. Follow up with General Surgery for staple removal 3. Please obtain BMP/CBC in one week 4. Please follow up on the following pending results:  Home Health: No Equipment/Devices: Shower stool   Discharge Condition: Stable CODE STATUS: Full code Diet recommendation: heart healthy, renal, soft to regular as tolerated  Brief/Interim Summary: 59 year old male with history of systolic CHF, scleroderma, HTN, CAD/PCI, HLD and malnutrition presenting with 2 days of nausea and vomiting and found to have small bowel obstruction.  He was managed conservatively with NG tube bowel decompression and IV fluid with improvement in symptoms.  NG tube discontinued on 10/01/2019.  He was cleared for discharge by general surgery from surgical standpoint. However, patient has significant emesis after NG tube removal.  General surgery recommended reinserting NGT.  Initially, patient refused NG tube replacement but he continued to have nausea and vomiting without bowel movement.  KUB without signs of SBO.  Attempted water enema without significant results continue to have intractable nausea and vomiting.  General surgery reconsulted on 6/11.  NG tube replaced.  CT abdomen and pelvis ordered.  Lumbar GI consulted as well for possible ileus/constipation.  Repeat CT abdomen and pelvis with persistent or recurrent SBO.  Cardiology consulted for preop eval in case he needs surgery.  Patient underwent ex lap with lysis of the adhesion by Dr. Donne Hazel on 10/06/2019.  He pulled out NG tube the night of 6/16.  Initially the plan was to initiate TPN but patient had massive BM on 6/17.  Started on clear liquid diet.  Hospital course  complicated by progressive AKI with significant azotemia. Nephrology consulted.  Renal ultrasound without acute finding.  Received IV fluid boluses with increased maintenance rate.  AKI resolved.   Discharge Diagnoses:  Principal Problem:   SBO (small bowel obstruction) (HCC) Active Problems:   Scleroderma (HCC)   Chronic renal insufficiency, stage 3 (moderate)   CAD S/P percutaneous coronary angioplasty   Hyperlipidemia   Essential hypertension   Pulmonary hypertension (HCC)   Chronic systolic (congestive) heart failure (HCC)   Intractable vomiting   Abnormal CT scan, small bowel  Small bowel obstruction-repeat CT abdomen and pelvis on 6/11 with persistent or recurrent SBO. Intractable nausea and vomiting-resolved. -Failed conservative measures with NG tube bowel decompression. -Exploratory laparotomy by Dr. Donne Hazel on 6/14 -Pulled his NG tube the night of 6/11-6/17 -Having BMs.  On soft diet now. Tolerating diet without nausea -Prealbumin 7.6.  Appreciate RD input. -Mobilize/OOB/PT/OT.   AKI on CKD-3B with azotemia in patient with history of membranous nephropathy: Baseline Cr 2.7-3.0 > 3.05 (admit)>> 7.58 (peak)>>> 1.82.  BUN 51 >> 106>>48> 28>21. Renal US without acute finding.   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  Hypernatremia: Na 149>> 141.   Hypokalemia/hypomagnesemia/hypophosphatemia: Refeeding syndrome?  K3.3.  Mg 1.5 -Replenished  Metabolic acidosis: Likely due to AKI and IV fluid.  Resolved.  Fever: Spiked fever to 101.4 on 6/11, then 100.4 on 6/13.  No further fever or signs of infection.  Chronic diastolic CHF: Echo on 6/96/7893 with EF of 55%, G2-DD and RVSP of 33.11mHg.  He is probably hypovolemic from GI loss.  No cardiopulmonary symptoms.  Net negative.  Followed by Dr. BHaroldine Laws  outpatient. -Cardiology following. -Monitor fluid status while on IV fluid -Diuretics to be resumed once at  home  Pulmonary hypertension: Echo as above. RHC in 06/2018 with PA of 81/26 and PCWP of 17 -Resumed home Opsumit and Revatio  Scleroderma-stable.  Followed by Dr. Estanislado Pandy -Continue home Norvasc, sildenafil  History of CAD s/p PCA-no anginal sx. 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 -On Aranesp and IV iron per nephrology -Continue monitoring  Productive cough: Improved -Mucolytic's and antitussive  Delirium: Resolved. -Reorientation and delirium precautions. -Discontinued lorazepam at wife's request.  Debility/physical deconditioning due to acute illness and hospitalization -PT/OT eval  Severe malnutrition:Body mass index is 18.37 kg/m.  Significant muscle mass and subcu fat loss.  Prealbumin 7.6. Nutrition Problem: Severe Malnutrition Etiology: chronic illness (CHF, scleroderma) Signs/Symptoms: moderate fat depletion, severe fat depletion, moderate muscle depletion, severe muscle depletion Interventions: Ensure Enlive (each supplement provides 350kcal and 20 grams of protein), Magic cup, MVI  Discharge Instructions:  Discharge Instructions    Call MD for:  difficulty breathing, headache or visual disturbances   Complete by: As directed    Call MD for:  persistant dizziness or light-headedness   Complete by: As directed    Call MD for:  persistant nausea and vomiting   Complete by: As directed    Call MD for:  redness, tenderness, or signs of infection (pain, swelling, redness, odor or green/yellow discharge around incision site)   Complete by: As directed    Call MD for:  severe uncontrolled pain   Complete by: As directed    Call MD for:  temperature >100.4   Complete by: As directed    Diet - low sodium heart healthy   Complete by: As directed    Increase activity slowly   Complete by: As directed    No wound  care   Complete by: As directed      Allergies as of 10/12/2019   No Known Allergies     Medication List    STOP taking these medications   HYDROcodone-acetaminophen 5-325 MG tablet Commonly known as: NORCO/VICODIN     TAKE these medications   allopurinol 100 MG tablet Commonly known as: ZYLOPRIM TAKE 1 TABLET BY MOUTH EVERY DAY   amLODipine 5 MG tablet Commonly known as: NORVASC Take 1 tablet (5 mg total) by mouth daily.   colchicine 0.6 MG tablet Take 0.6 mg by mouth daily as needed (gout attacks).   escitalopram 5 MG tablet Commonly known as: LEXAPRO Take 5 mg by mouth daily.   feeding supplement (ENSURE ENLIVE) Liqd Take 237 mLs by mouth 3 (three) times daily between meals.   fluticasone 50 MCG/ACT nasal spray Commonly known as: FLONASE Place 2 sprays into both nostrils daily as needed (seasonal allergies).   Opsumit 10 MG tablet Generic drug: macitentan Take 1 tablet (10 mg total) by mouth daily.   oxyCODONE 5 MG immediate release tablet Commonly known as: Oxy IR/ROXICODONE Take 1 tablet (5 mg total) by mouth every 4 (four) hours as needed.   pantoprazole 40 MG tablet Commonly known as: PROTONIX Take 1 tablet (40 mg total) by mouth daily. What changed: when to take this   potassium chloride SA 20 MEQ tablet Commonly known as: KLOR-CON Take 2 tablets (40 mEq total) by mouth 2 (two) times daily. What changed:   how much to take  when to take this   RETACRIT IJ Inject as directed every 30 (thirty) days.   sildenafil 20 MG tablet Commonly known as: REVATIO Take 2 tablets (40 mg total) by mouth 3 (three) times daily. What changed:   when to take this  additional instructions   simvastatin 40 MG tablet Commonly known as: ZOCOR Take 1 tablet (40 mg total) by mouth daily. Daily in the morning What changed: additional instructions   Suprep Bowel Prep Kit 17.5-3.13-1.6 GM/177ML Soln Generic drug: Na Sulfate-K Sulfate-Mg Sulf Take by mouth as  directed.   torsemide 20 MG tablet Commonly known as: DEMADEX Take 1 tablet (20 mg total) by mouth daily. What changed:   how much to take  when to take this  additional instructions            Durable Medical Equipment  (From admission, onward)         Start     Ordered   10/12/19 1350  DME Shower stool  Once        10/12/19 1350          Follow-up Information    [facility], [facility] Follow up on 10/17/2019.   Specialty: General Surgery Why: this is a nurse only visit for staple removal.  You will then have an appointment arranged with the surgeon.  Our office will call you and given you these appointment times and dates Contact information: [address] [place] [address] [phone]        [name], MD Follow up.   Specialty: Internal Medicine Contact information: [place] [address] [phone]        Lorretta Harp, MD .   Specialties: Cardiology, Radiology Contact information: [address] [place] [address] [phone]        Bensimhon, Shaune Pascal, MD .   Specialty: Cardiology Contact information: [address] [place] [address] [phone]        Bo Merino, MD Follow up.   Specialty: Rheumatology Contact information: [address] [place] [address] [phone]              No Known Allergies  Consultations:  General Surgery  Nephrology  Cardiology   Gastroenterology   Procedures/Studies: 6/6-6/9 NG tube for SBO 6/11-NG tube replaced. 6/14-exploratory laparotomy with lysis of adhesion by Dr. Donne Hazel CT ABDOMEN PELVIS WO CONTRAST  Result Date: 10/03/2019 CLINICAL DATA:  Nausea and vomiting. Small-bowel obstruction on CT 09/28/2019. Improvement in clinical symptoms and symptoms return. EXAM: CT ABDOMEN AND PELVIS WITHOUT CONTRAST TECHNIQUE: Multidetector CT imaging of the abdomen and pelvis  was performed following the standard protocol without IV contrast. COMPARISON:  CT 09/28/2019 FINDINGS: Lower chest: Moderate right pleural effusion is only partially included. Adjacent compressive atelectasis. Subpleural reticulation within both lung bases. There is enteric contrast in the distal esophagus which is dilated. Coronary artery calcifications. Hepatobiliary: No evidence of focal hepatic lesion on noncontrast exam. Branching calcifications in the left liver likely vascular. Calcified gallstones within decompressed gallbladder. Pancreas: No ductal dilatation or inflammation. Spleen: Normal in size without focal abnormality. Adrenals/Urinary Tract: No adrenal nodule. No hydronephrosis. No significant perinephric edema. No renal calculi. Urinary bladder is physiologically distended. Stomach/Bowel: Enteric tube in the stomach. The distal esophagus is dilated with enteric contrast. Mild gastric dilatation. Small bowel are diffusely dilated and fluid/contrast filled. Transition point in the right mid abdomen, series 3, image 56 with mild small bowel wall thickening,  also seen on prior exam. No pneumatosis. The more distal small bowel are decompressed. Small to moderate volume of stool throughout the colon. No colonic wall thickening. Normal appendix tentatively visualized, series 3, image 66. Vascular/Lymphatic: Advanced aortic and branch atherosclerosis. No aortic aneurysm. No bulky abdominopelvic adenopathy. Reproductive: Prostate is unremarkable. Other: Small amount of perihepatic free fluid.  No free air. Musculoskeletal: There are no acute or suspicious osseous abnormalities. IMPRESSION: 1. Persistent or recurrent small bowel obstruction with transition point in the right mid abdomen. There is small bowel wall thickening at the site of transition. 2. Small amount of perihepatic free fluid. No free air. 3. Moderate right pleural effusion is only partially included. 4. Cholelithiasis. Aortic Atherosclerosis  (ICD10-I70.0). Electronically Signed   By: Keith Rake M.D.   On: 10/03/2019 20:46   CT Abdomen Pelvis Wo Contrast  Result Date: 09/28/2019 CLINICAL DATA:  Vomiting for 2 days EXAM: CT ABDOMEN AND PELVIS WITHOUT CONTRAST TECHNIQUE: Multidetector CT imaging of the abdomen and pelvis was performed following the standard protocol without IV contrast. COMPARISON:  09/14/2018 FINDINGS: Lower chest: Chronic small right pleural effusion unchanged. Bibasilar scarring and fibrosis again noted. Stable atherosclerosis of the coronary vasculature. Hepatobiliary: Calcified gallstone identified without cholecystitis. No focal liver abnormalities. Pancreas: Unremarkable. No pancreatic ductal dilatation or surrounding inflammatory changes. Spleen: Normal in size without focal abnormality. Adrenals/Urinary Tract: No urinary tract calculi or obstructive uropathy within either kidney. Bladder is unremarkable. Adrenals are normal. Stomach/Bowel: Multiple dilated loops of proximal small bowel to the level of the distal jejunum, measuring up to 3.8 cm in diameter. There are diffuse gas fluid level seen within the proximal to mid jejunum, consistent small bowel obstruction. The exact point of obstruction is difficult to identify, but may be within the central pelvis on image 68 at a site of jejunal wall thickening. There is a normal appendix right lower quadrant. Vascular/Lymphatic: Aortic atherosclerosis. No enlarged abdominal or pelvic lymph nodes. Reproductive: Prostate is unremarkable. Other: There is trace free fluid in the sub hepatic region. No free intraperitoneal gas. Musculoskeletal: No acute or destructive bony lesions. Reconstructed images demonstrate no additional findings. IMPRESSION: 1. Small-bowel obstruction, with the exact point of obstruction difficult to identify, but may be within the central pelvis at a site of jejunal wall thickening. 2. Cholelithiasis without cholecystitis. 3. Chronic small right pleural  effusion. 4. Aortic Atherosclerosis (ICD10-I70.0). Electronically Signed   By: Randa Ngo M.D.   On: 09/28/2019 16:54   DG Abd 1 View  Result Date: 09/28/2019 CLINICAL DATA:  Status post nasogastric tube placement. EXAM: ABDOMEN - 1 VIEW COMPARISON:  None. FINDINGS: A nasogastric tube is seen with its distal tip overlying the body of the stomach. There is opacification of the visualized portion of the right lung base. The bowel gas pattern is normal. No radio-opaque calculi or other significant radiographic abnormality are seen. Radiopaque surgical coils are seen overlying the pelvis on the right. IMPRESSION: Nasogastric tube positioning, as described above. Electronically Signed   By: Virgina Norfolk M.D.   On: 09/28/2019 17:52   US RENAL  Result Date: 10/03/2019 CLINICAL DATA:  Acute kidney injury. EXAM: RENAL / URINARY TRACT ULTRASOUND COMPLETE COMPARISON:  Abdominal CT 09/28/2018 FINDINGS: Right Kidney: Renal measurements: AP 1 x 4.8 x 4.7 cm = volume: 96 mL. Diffusely increased renal echogenicity. Mild thinning of the renal parenchyma. No hydronephrosis. No evidence of focal lesion. Left Kidney: Renal measurements: 9.7 x 5.9 x 5.1 cm = volume: 152 mL. Slight increased renal echogenicity.  No hydronephrosis. No focal lesion. Bladder: Appears normal for degree of bladder distention. Other: Ascites and right pleural effusion, also seen on recent CT. IMPRESSION: 1. Increased bilateral renal echogenicity consistent with chronic medical renal disease. Mild right renal atrophy. No obstructive uropathy. 2. Small volume ascites and right pleural effusion, as seen on recent CT. Electronically Signed   By: Keith Rake M.D.   On: 10/03/2019 17:03   DG Abd Portable 1V  Result Date: 10/05/2019 CLINICAL DATA:  Small-bowel obstruction.  Vomiting EXAM: PORTABLE ABDOMEN - 1 VIEW COMPARISON:  10/04/2019; 10/03/2019; CT abdomen pelvis-10/03/2019 FINDINGS: Enteric tube side port again projects over the distal  esophagus. Moderate colonic stool burden without evidence of enteric obstruction. No supine evidence of pneumoperitoneum. No pneumatosis or portal venous gas. Embolization coil overlies the right lower pelvis. Vascular calcifications overlie the pelvis bilaterally. No definitive abnormal intra-abdominal calcifications. Limited visualization of lower thorax suggests a trace right-sided pleural effusion. No acute osseous abnormalities. IMPRESSION: 1. Enteric tube side port projects over the distal esophagus. Advancement at least 12 cm is advised. 2. Paucity of bowel gas without evidence of enteric obstruction. 3. Trace right-sided pleural effusion. Electronically Signed   By: Sandi Mariscal M.D.   On: 10/05/2019 07:57   DG Abd Portable 1V  Result Date: 10/04/2019 CLINICAL DATA:  Nausea and vomiting. Evaluate for small bowel obstruction. EXAM: PORTABLE ABDOMEN - 1 VIEW COMPARISON:  10/03/2019 FINDINGS: Nasogastric tube has been pulled back and has tip over the stomach in the left upper quadrant and side-port over the distal esophagus above the gastroesophageal junction. Bowel gas pattern is nonobstructive. No free peritoneal air. Cardiomediastinal silhouette and remainder of the exam is unchanged. IMPRESSION: 1.  Nonobstructive bowel gas pattern. 2. Nasogastric tube has been pulled back slightly as tip is over the stomach in the left upper quadrant and side-port over the distal esophagus. This could be advanced 6-7 cm. Electronically Signed   By: Marin Olp M.D.   On: 10/04/2019 11:37   DG Abd Portable 1V  Result Date: 10/03/2019 CLINICAL DATA:  59 year old male enteric tube placement. EXAM: PORTABLE ABDOMEN - 1 VIEW COMPARISON:  10/02/2019 KUB and earlier. FINDINGS: Portable AP supine view at 1446 hours. Enteric tube terminates in the left upper quadrant, side hole at the level of the gastric cardia. Bowel-gas pattern has not significantly changed from the CT on 09/28/2019, and might indicate continued  fluid-filled small bowel loops throughout the abdomen. Right pleural effusion is less apparent. Lung bases appear stable. No acute osseous abnormality identified. IMPRESSION: 1. Enteric tube placed into the stomach, side hole at the level of the gastric cardia. 2. Stable bowel gas pattern since 09/28/2019, suspicious for ongoing fluid-filled small bowel. Electronically Signed   By: Genevie Ann M.D.   On: 10/03/2019 15:14   DG Abd Portable 1V  Result Date: 10/02/2019 CLINICAL DATA:  Follow-up small bowel obstruction EXAM: PORTABLE ABDOMEN - 1 VIEW COMPARISON:  09/29/2019 and prior exams FINDINGS: No bowel dilatation is identified. A small to moderate amount of stool in the colonic hepatic flexure noted. No acute abnormalities are identified. NG tube is not visualized. IMPRESSION: No dilated bowel loops identified. Electronically Signed   By: Margarette Canada M.D.   On: 10/02/2019 15:14   DG Abd Portable 1V-Small Bowel Obstruction Protocol-24 hr delay  Result Date: 09/30/2019 CLINICAL DATA:  Small bowel obstruction. EXAM: PORTABLE ABDOMEN - 1 VIEW COMPARISON:  Plain films of the abdomen 09/29/2019. CT abdomen and pelvis 09/28/2019. FINDINGS: NG tube side port  is above the stomach. The tube should be advanced 5-6 cm. No small bowel dilatation is seen. There is gas and stool in the colon. IMPRESSION: NG tube side port is above the gastroesophageal junction and the tube should be advanced 5-6 cm. No plain film evidence of bowel obstruction. Electronically Signed   By: Inge Rise M.D.   On: 09/30/2019 15:00   DG Abd Portable 1V-Small Bowel Obstruction Protocol-initial, 8 hr delay  Result Date: 09/29/2019 CLINICAL DATA:  Abdominal pain. EXAM: PORTABLE ABDOMEN - 1 VIEW COMPARISON:  September 29, 2019 FINDINGS: The distal tip of the nasogastric tube is seen overlying the left upper quadrant. The bowel gas pattern is normal. A moderate amount of stool is seen within the large bowel. No radio-opaque calculi or other  significant radiographic abnormality are seen. IMPRESSION: 1. Moderate severity stool burden without evidence of bowel obstruction. Electronically Signed   By: Virgina Norfolk M.D.   On: 09/29/2019 20:23   DG Abd Portable 1V  Result Date: 09/29/2019 CLINICAL DATA:  NG tube placement. EXAM: PORTABLE ABDOMEN - 1 VIEW COMPARISON:  Radiograph in CT yesterday. FINDINGS: Tip and side port of the enteric tube below the diaphragm in the stomach. Patient small-bowel distention on CT is primarily fluid-filled and not well demonstrated by radiograph. There is stool in the left colon. IMPRESSION: 1. Tip and side port of the enteric tube below the diaphragm in the stomach. 2. Small-bowel distention is primarily fluid-filled and better demonstrated on CT yesterday. Electronically Signed   By: Keith Rake M.D.   On: 09/29/2019 01:15   [place] EKG SITE RITE  Result Date: 10/08/2019 If Site Rite image not attached, placement could not be confirmed due to current cardiac rhythm.     Subjective: Feels well. Food doesn't taste good, but no nausea and keeping it down.  Discharge Exam: Vitals:   10/12/19 0415 10/12/19 0732  BP: 118/70 122/69  Pulse: 93 91  Resp: 16 15  Temp: 99 F (37.2 C) 99.6 F (37.6 C)  SpO2: 100% 95%   Vitals:   10/11/19 1945 10/12/19 0415 10/12/19 0500 10/12/19 0732  BP: 119/68 118/70  122/69  Pulse: (!) 101 93  91  Resp: _0 Temp: 99.8 F (37.7 C) 99 F (37.2 C)  99.6 F (37.6 C)  TempSrc: Oral Oral  Oral  SpO2: 100% 100%  95%  Weight:   56.1 kg   Height:        General: Pt is alert, awake, not in acute distress Cardiovascular: RRR, S1/S2 +, no rubs, no gallops Respiratory: CTA bilaterally, no wheezing, no rhonchi Abdominal: Soft, NT, ND, bowel sounds + Extremities: no edema, no cyanosis    The results of significant diagnostics from this hospitalization (including imaging, microbiology, ancillary and laboratory) are listed below for reference.      Microbiology: Recent Results (from the past 240 hour(s))  Surgical pcr screen     Status: None   Collection Time: 10/05/19 10:07 AM   Specimen: Nasal Mucosa; Nasal Swab  Result Value Ref Range Status   MRSA, PCR NEGATIVE NEGATIVE Final   Staphylococcus aureus NEGATIVE NEGATIVE Final    Comment: (NOTE) The Xpert SA Assay (FDA approved for NASAL specimens in patients 41 years of age and older), is one component of a comprehensive surveillance program. It is not intended to diagnose infection nor to guide or monitor treatment. Performed at [facility], [place] [address] [place], [place] [address]  Labs: BNP (last 3 results) Recent Labs    05/12/19 1026 06/11/19 1204 09/28/19 1310  BNP 297.0* 197.3* 102.5*   Basic Metabolic Panel: Recent Labs  Lab 10/08/19 0829 10/09/19 0450 10/10/19 0434 10/11/19 0316 10/11/19 0812 10/12/19 0801  NA 145 145 139 141  --  142  K 3.3* 3.6 3.5 3.3*  --  3.2*  CL 114* 119* 108 111  --  113*  CO2 20* 18* 23 24  --  22  GLUCOSE 100* 76 75 94  --  99  BUN 62* 48* 38* 28*  --  21*  CREATININE 2.59* 2.25* 1.90* 1.85*  --  1.82*  CALCIUM 7.8* 7.8* 7.2* 7.2*  --  7.2*  MG 2.1 1.8 1.8  --  1.5* 1.9  PHOS 3.4 2.4* 2.2* 2.1*  --  2.5   Liver Function Tests: Recent Labs  Lab 10/08/19 0829 10/09/19 0450 10/10/19 0434 10/11/19 0316 10/12/19 0801  AST  --  20  --   --   --   ALT  --  9  --   --   --   ALKPHOS  --  70  --   --   --   BILITOT  --  0.5  --   --   --   PROT  --  5.2*  --   --   --   ALBUMIN 2.0* 2.1* 1.9* 1.8* 1.8*   CBC: Recent Labs  Lab 10/06/19 0237 10/06/19 0237 10/07/19 0334 10/07/19 0334 10/08/19 0829 10/08/19 0829 10/08/19 1635 10/09/19 0450 10/10/19 0434 10/11/19 0316 10/12/19 0801  WBC 7.5  --  10.6*  --  9.0  --   --   --   --   --   --   NEUTROABS  --   --   --   --   --   --   --  7.6  --   --   --   HGB 9.9*   < > 9.7*   < > 8.3*   < > 8.9* 8.7* 8.1* 8.0* 7.6*  HCT 31.9*   < > 32.5*   <  > 27.5*   < > 29.8* 28.9* 27.3* 26.9* 25.7*  MCV 98.8  --  101.6*  --  101.5*  --   --   --   --   --   --   PLT 203  --  189  --  177  --   --   --   --   --   --    < > = values in this interval not displayed.   CBG: Recent Labs  Lab 10/11/19 1315 10/11/19 1822 10/11/19 2345 10/12/19 0621 10/12/19 1214  GLUCAP 89 71 90 86 90   Lipid Profile Recent Labs    10/10/19 0434  TRIG 160*   Anemia work up Recent Labs    10/11/19 0812  TIBC 133*  IRON 22*   Urinalysis    Component Value Date/Time   COLORURINE YELLOW 10/03/2019 2300   APPEARANCEUR HAZY (A) 10/03/2019 2300   LABSPEC 1.016 10/03/2019 2300   PHURINE 5.0 10/03/2019 2300   GLUCOSEU NEGATIVE 10/03/2019 2300   HGBUR NEGATIVE 10/03/2019 2300   BILIRUBINUR SMALL (A) 10/03/2019 2300   KETONESUR NEGATIVE 10/03/2019 2300   PROTEINUR 100 (A) 10/03/2019 2300   NITRITE NEGATIVE 10/03/2019 2300   LEUKOCYTESUR NEGATIVE 10/03/2019 2300   Microbiology Recent Results (from the past 240 hour(s))  Surgical pcr screen  Status: None   Collection Time: 10/05/19 10:07 AM   Specimen: Nasal Mucosa; Nasal Swab  Result Value Ref Range Status   MRSA, PCR NEGATIVE NEGATIVE Final   Staphylococcus aureus NEGATIVE NEGATIVE Final    Comment: (NOTE) The Xpert SA Assay (FDA approved for NASAL specimens in patients 10 years of age and older), is one component of a comprehensive surveillance program. It is not intended to diagnose infection nor to guide or monitor treatment. Performed at [facility], [place] [address], [place], [place] [address]      Time coordinating discharge: Over 30 minutes  SIGNED:   Donnamae Jude, MD  [facility] 10/12/2019, 1:57 PM  If 7PM-7AM, please contact night-coverage

## 2019-10-12 NOTE — Progress Notes (Signed)
Patient ID: [name], male   DOB: [birth year], 59 y.o.   MRN: [id number]    6 Days Post-Op  Subjective: Doesn't like the food here so not eating much of the solid food.  Still having bowel function.  States he is going home today.  ROS: See above, otherwise other systems negative  Objective: Vital signs in last 24 hours: Temp:  [98.3 F (36.8 C)-99.8 F (37.7 C)] 99.6 F (37.6 C) (06/20 0732) Pulse Rate:  [91-101] 91 (06/20 0732) Resp:  [15-17] 15 (06/20 0732) BP: (118-132)/(68-77) 122/69 (06/20 0732) SpO2:  [95 %-100 %] 95 % (06/20 0732) Weight:  [56.1 kg] 56.1 kg (06/20 0500) Last BM Date: 10/13/19  Intake/Output from previous day: 06/19 0701 - 06/20 0700 In: 1738.3 [I.V.:1738.3] Out: 200 [Urine:200] Intake/Output this shift: No intake/output data recorded.  PE: Abd: soft, appropriately tender, midline incision with a little bit of serous drainage from the inferior aspect.  No purulent drainage noted.  Staples otherwise intact.    Lab Results:  Recent Labs    10/11/19 0316 10/12/19 0801  HGB 8.0* 7.6*  HCT 26.9* 25.7*   BMET Recent Labs    10/11/19 0316 10/12/19 0801  NA 141 142  K 3.3* 3.2*  CL 111 113*  CO2 24 22  GLUCOSE 94 99  BUN 28* 21*  CREATININE 1.85* 1.82*  CALCIUM 7.2* 7.2*   PT/INR No results for input(s): LABPROT, INR in the last 72 hours. CMP     Component Value Date/Time   NA 142 10/12/2019 0801   NA 145 (H) 06/11/2018 0852   K 3.2 (L) 10/12/2019 0801   CL 113 (H) 10/12/2019 0801   CO2 22 10/12/2019 0801   GLUCOSE 99 10/12/2019 0801   BUN 21 (H) 10/12/2019 0801   BUN 34 (H) 06/11/2018 0852   CREATININE 1.82 (H) 10/12/2019 0801   CREATININE 2.01 (H) 03/23/2017 1527   CALCIUM 7.2 (L) 10/12/2019 0801   PROT 5.2 (L) 10/09/2019 0450   ALBUMIN 1.8 (L) 10/12/2019 0801   AST 20 10/09/2019 0450   ALT 9 10/09/2019 0450   ALKPHOS 70 10/09/2019 0450   BILITOT 0.5 10/09/2019 0450   GFRNONAA 39 (L) 10/12/2019 0801   GFRNONAA 36  (L) 03/23/2017 1527   GFRAA 46 (L) 10/12/2019 0801   GFRAA 41 (L) 03/23/2017 1527   Lipase     Component Value Date/Time   LIPASE 40 09/28/2019 1310       Studies/Results: No results found.  Anti-infectives: Anti-infectives (From admission, onward)   Start     Dose/Rate Route Frequency Ordered Stop   10/06/19 0745  ceFAZolin (ANCEF) IVPB 2g/100 mL premix        2 g 200 mL/hr over 30 Minutes Intravenous  Once 10/06/19 0735 10/06/19 1700   10/06/19 0600  cefoTEtan (CEFOTAN) 2 g in sodium chloride 0.9 % 100 mL IVPB        2 g 200 mL/hr over 30 Minutes Intravenous On call to O.R. 10/05/19 0810 10/07/19 0843       Assessment/Plan HTN HLD CAD  Hypothyroidism ARF- Cr1.9 - Per [facility] - PCM - prealbumin 7.6 TNA to start after line placed  [facility] S/p exploratory laparotomy and LOA 10/06/19 Dr. Donne Hazel - POD#6 -soft diet  -follow staple removal and surgeon follow up is being arranged by our office and will call and give him his appt dates and times. -IS/pulm toilet -mibilize -stable for DC home from our standpoint -DC instructions discussed with patient and his  wife.  FEN -soft diet VTE -SCDs,SQheparin  ID -none Foley -out   LOS: 14 days    Henreitta Cea , [facility] 10/12/2019, 12:24 PM Please see Amion for pager number during day hours 7:00am-4:30pm or 7:00am -11:30am on weekends

## 2019-10-12 NOTE — Plan of Care (Signed)
  Problem: Education: Goal: Knowledge of General Education information will improve Description: Including pain rating scale, medication(s)/side effects and non-pharmacologic comfort measures 10/12/2019 1509 by Willia Craze, RN Outcome: Adequate for Discharge 10/12/2019 0808 by Willia Craze, RN Outcome: Progressing   Problem: Health Behavior/Discharge Planning: Goal: Ability to manage health-related needs will improve 10/12/2019 1509 by Willia Craze, RN Outcome: Adequate for Discharge 10/12/2019 0808 by Willia Craze, RN Outcome: Progressing   Problem: Clinical Measurements: Goal: Ability to maintain clinical measurements within normal limits will improve 10/12/2019 1509 by Willia Craze, RN Outcome: Adequate for Discharge 10/12/2019 0808 by Willia Craze, RN Outcome: Progressing Goal: Will remain free from infection Outcome: Adequate for Discharge Goal: Diagnostic test results will improve Outcome: Adequate for Discharge Goal: Respiratory complications will improve Outcome: Adequate for Discharge Goal: Cardiovascular complication will be avoided Outcome: Adequate for Discharge   Problem: Activity: Goal: Risk for activity intolerance will decrease 10/12/2019 1509 by Willia Craze, RN Outcome: Adequate for Discharge 10/12/2019 0808 by Willia Craze, RN Outcome: Progressing   Problem: Nutrition: Goal: Adequate nutrition will be maintained 10/12/2019 1509 by Willia Craze, RN Outcome: Adequate for Discharge 10/12/2019 0808 by Willia Craze, RN Outcome: Progressing   Problem: Coping: Goal: Level of anxiety will decrease Outcome: Adequate for Discharge   Problem: Elimination: Goal: Will not experience complications related to bowel motility Outcome: Adequate for Discharge Goal: Will not experience complications related to urinary retention Outcome: Adequate for Discharge   Problem: Pain Managment: Goal: General experience of comfort will  improve Outcome: Adequate for Discharge   Problem: Safety: Goal: Ability to remain free from injury will improve Outcome: Adequate for Discharge   Problem: Skin Integrity: Goal: Risk for impaired skin integrity will decrease Outcome: Adequate for Discharge

## 2019-10-12 NOTE — Plan of Care (Signed)

## 2019-10-12 NOTE — Plan of Care (Signed)

## 2019-10-12 NOTE — Progress Notes (Signed)
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 Adin Hector.  Baseline creatinine 2.5 mg/dL.  Renal ultrasound revealed no evidence of obstruction.  Maintenance IV fluids have been given 1.5 L normal saline bolus administered as patient appears to be dry and cachectic.    Status post lysis of adhesions with laparotomy 10/06/2019  Blood pressure 122/69 pulse 91 temperature 99.6 O2 sats 95% room air  Urine output 200 cc 10/11/2019.  Labs pending a.m. 10/12/2019 we will order daily renal panel  IV D5 1/2 normal saline 75 cc an hour   Objective:  Vital signs in last 24 hours:  Temp:  [98.3 F (36.8 C)-99.8 F (37.7 C)] 99.6 F (37.6 C) (06/20 0732) Pulse Rate:  [91-101] 91 (06/20 0732) Resp:  [15-17] 15 (06/20 0732) BP: (118-132)/(68-77) 122/69 (06/20 0732) SpO2:  [95 %-100 %] 95 % (06/20 0732) Weight:  [56.1 kg] 56.1 kg (06/20 0500)  Weight change:  Filed Weights   10/09/19 0421 10/10/19 0601 10/12/19 0500  Weight: 54.7 kg 54.8 kg 56.1 kg    Intake/Output: I/O last 3 completed shifts: In: 3068.8 [I.V.:3068.8] Out: 400 [Urine:400]   Intake/Output this shift:  No intake/output data recorded.  General: Cachectic older looking male lying on bed with NG tube in place Heart:RRR, s1s2 nl, no rubs Lungs: Clear b/l, no crackle Abdomen:soft, Non-tender, non-distended Extremities:No LE edema Neurology: Alert, awake and following commands.  No asterixis   Basic Metabolic Panel: Recent Labs  Lab 10/07/19 0334 10/07/19 0334 10/08/19 0829 10/08/19 0829 10/09/19 0450 10/10/19 0434 10/11/19 0316 10/11/19 0812  NA 145  --  145  --  145 139 141  --   K 3.9  --  3.3*  --  3.6 3.5 3.3*  --   CL 113*  --  114*  --  119* 108  111  --   CO2 19*  --  20*  --  18* 23 24  --   GLUCOSE 132*  --  100*  --  76 75 94  --   BUN 78*  --  62*  --  48* 38* 28*  --   CREATININE 3.39*  --  2.59*  --  2.25* 1.90* 1.85*  --   CALCIUM 7.9*   < > 7.8*   < > 7.8* 7.2* 7.2*  --   MG 2.3  --  2.1  --  1.8 1.8  --  1.5*  PHOS 6.2*  --  3.4  --  2.4* 2.2* 2.1*  --    < > = values in this interval not displayed.    Liver Function Tests: Recent Labs  Lab 10/07/19 0334 10/08/19 0829 10/09/19 0450 10/10/19 0434 10/11/19 0316  AST  --   --  20  --   --   ALT  --   --  9  --   --   ALKPHOS  --   --  70  --   --   BILITOT  --   --  0.5  --   --   PROT  --   --  5.2*  --   --   ALBUMIN 2.1* 2.0* 2.1* 1.9* 1.8*   No results for input(s): LIPASE, AMYLASE in the last 168 hours. No results for input(s): AMMONIA in the  last 168 hours.  CBC: Recent Labs  Lab 10/05/19 0833 10/05/19 0833 10/06/19 0237 10/06/19 0237 10/07/19 0334 10/07/19 0334 10/08/19 0829 10/08/19 1635 10/09/19 0450 10/10/19 0434 10/11/19 0316  WBC 7.0  --  7.5  --  10.6*  --  9.0  --   --   --   --   NEUTROABS  --   --   --   --   --   --   --   --  7.6  --   --   HGB 9.5*   < > 9.9*   < > 9.7*   < > 8.3* 8.9* 8.7* 8.1* 8.0*  HCT 30.6*   < > 31.9*   < > 32.5*   < > 27.5* 29.8* 28.9* 27.3* 26.9*  MCV 98.7  --  98.8  --  101.6*  --  101.5*  --   --   --   --   PLT 166  --  203  --  189  --  177  --   --   --   --    < > = values in this interval not displayed.    Cardiac Enzymes: No results for input(s): CKTOTAL, CKMB, CKMBINDEX, TROPONINI in the last 168 hours.  BNP: Invalid input(s): POCBNP  CBG: Recent Labs  Lab 10/11/19 0621 10/11/19 1315 10/11/19 1822 10/11/19 2345 10/12/19 0621  GLUCAP 92 89 71 90 86    Microbiology: Results for orders placed or performed during the hospital encounter of 09/28/19  SARS Coronavirus 2 by RT PCR (hospital order, performed in [facility] hospital lab) Nasopharyngeal Nasopharyngeal Swab     Status: None    Collection Time: 09/28/19  4:59 PM   Specimen: Nasopharyngeal Swab  Result Value Ref Range Status   SARS Coronavirus 2 NEGATIVE NEGATIVE Final    Comment: (Skinner) SARS-CoV-2 target nucleic acids are NOT DETECTED. The SARS-CoV-2 RNA is generally detectable in upper and lower respiratory specimens during the acute phase of infection. The lowest concentration of SARS-CoV-2 viral copies this assay can detect is 250 copies / mL. A negative result does not preclude SARS-CoV-2 infection and should not be used as the sole basis for treatment or other patient management decisions.  A negative result may occur with improper specimen collection / handling, submission of specimen other than nasopharyngeal swab, presence of viral mutation(s) within the areas targeted by this assay, and inadequate number of viral copies (<250 copies / mL). A negative result must be combined with clinical observations, patient history, and epidemiological information. Fact Sheet for Patients:   StrictlyIdeas.no Fact Sheet for Healthcare Providers: BankingDealers.co.za This test is not yet approved or cleared  by the [place] FDA and has been authorized for detection and/or diagnosis of SARS-CoV-2 by FDA under an Emergency Use Authorization (EUA).  This EUA will remain in effect (meaning this test can be used) for the duration of the COVID-19 declaration under Section 564(b)(1) of the Act, 21 U.S.C. section 360bbb-3(b)(1), unless the authorization is terminated or revoked sooner. Performed at [facility], [place] [address], [place], [place] [address]   Surgical pcr screen     Status: None   Collection Time: 10/05/19 10:07 AM   Specimen: Nasal Mucosa; Nasal Swab  Result Value Ref Range Status   MRSA, PCR NEGATIVE NEGATIVE Final   Staphylococcus aureus NEGATIVE NEGATIVE Final    Comment: (Skinner) The Xpert SA Assay (FDA approved for NASAL specimens in  patients 41 years of age  and older), is one component of a comprehensive surveillance program. It is not intended to diagnose infection nor to guide or monitor treatment. Performed at [facility], [place] [address] [place], [place] [address]     Coagulation Studies: No results for input(s): LABPROT, INR in the last 72 hours.  Urinalysis: No results for input(s): COLORURINE, LABSPEC, PHURINE, GLUCOSEU, HGBUR, BILIRUBINUR, KETONESUR, PROTEINUR, UROBILINOGEN, NITRITE, LEUKOCYTESUR in the last 72 hours.  Invalid input(s): APPERANCEUR    Imaging: No results found.   Medications:   . dextrose 5 % and 0.9% NaCl 75 mL/hr at 10/12/19 0317   . allopurinol  100 mg Oral Daily  . amLODipine  5 mg Oral Daily  . Chlorhexidine Gluconate Cloth  6 each Topical Daily  . darbepoetin (ARANESP) injection - NON-DIALYSIS  100 mcg Subcutaneous Q Sat-1800  . dextrose  1 Tube Oral Once  . escitalopram  5 mg Oral Daily  . feeding supplement (ENSURE ENLIVE)  237 mL Oral TID BM  . heparin  5,000 Units Subcutaneous Q8H  . insulin aspart  0-9 Units Subcutaneous Q6H  . macitentan  10 mg Oral Daily  . multivitamin with minerals  1 tablet Oral Daily  . pantoprazole  40 mg Oral Daily  . sildenafil  40 mg Oral TID  . simvastatin  40 mg Oral Daily  . sodium chloride flush  3 mL Intravenous Once   bisacodyl, guaiFENesin-dextromethorphan, labetalol, methocarbamol, ondansetron **OR** ondansetron (ZOFRAN) IV, oxyCODONE, phenol, prochlorperazine  Assessment/ Plan:   Acute kidney injury setting of baseline chronic kidney disease stage IV.  Baseline creatinine appears about 2.5.  He has membranous nephropathy per biopsy.  Renal ultrasound does not reveal any evidence of hydronephrosis.  Patient appeared to be cachectic and dry on admission.  Received IV fluids.   We will decrease rate to 75 cc an hour.  He continues to make urine with 750 cc of urine 10/09/2019. .  We will continue to follow as renal function  slowly improves.  It appears that renal functions returned to baseline.  Mild metabolic acidosis seems to have improved.  Small bowel obstruction conservative management initially underwent exploratory laparotomy with lysis of adhesions 10/06/2018.  Hypertension/volume continue to follow blood pressures acceptable.     Anemia received IV iron and Aranesp 10/03/2019.  Will redose with Aranesp..  Will replete with IV iron as iron levels appear low  Hypernatremia mild we will continue to follow  Hypokalemia will replete.  Hypophosphatemia appears replete.   LOS: [place] _0 _1 :21 AM

## 2019-10-16 ENCOUNTER — Ambulatory Visit: Payer: 59 | Admitting: Gastroenterology

## 2019-10-21 ENCOUNTER — Telehealth: Payer: Self-pay | Admitting: Gastroenterology

## 2019-10-21 NOTE — Telephone Encounter (Signed)
Spoke to patient's wife to let her know that the 10/28/19 EGD at [facility] has been canceled until further notice. Dr Bryan Lemma, please advise on patient's recent hospitalization.  Thanks, [employer]

## 2019-10-21 NOTE — Telephone Encounter (Signed)
Pt's wife [name] called to inform that pt was hospitalized and had stomach surgery so he cannot have procedure that is currently scheduled for 7/6 at [place]. She would like to cancel procedure and r/s when Cirigliano thinks is appropriate. Pls call her.

## 2019-10-24 ENCOUNTER — Inpatient Hospital Stay (HOSPITAL_COMMUNITY): Admission: RE | Admit: 2019-10-24 | Payer: 59 | Source: Ambulatory Visit

## 2019-10-28 ENCOUNTER — Ambulatory Visit (HOSPITAL_COMMUNITY): Admission: RE | Admit: 2019-10-28 | Payer: 59 | Source: Home / Self Care | Admitting: Gastroenterology

## 2019-10-28 ENCOUNTER — Encounter (HOSPITAL_COMMUNITY): Payer: 59

## 2019-10-28 ENCOUNTER — Encounter (HOSPITAL_COMMUNITY): Admission: RE | Payer: Self-pay | Source: Home / Self Care

## 2019-10-28 SURGERY — ESOPHAGOGASTRODUODENOSCOPY (EGD) WITH PROPOFOL
Anesthesia: Monitor Anesthesia Care

## 2019-10-28 NOTE — Telephone Encounter (Signed)
Chart reviewed from his recent hospitalization.  Hospitalized with [facility] and underwent surgery with LOA.  Very reasonable to delay procedures at this time, and if no recurrence of upper GI symptoms, may not need to pursue EGD.  Plan for follow-up in the GI clinic with myself or Janett Billow (he saw her last time) and can discuss optimal timing for any endoscopic procedures.  Thank you.

## 2019-10-28 NOTE — Telephone Encounter (Signed)
Spoke to patient's wife to schedule a follow up appointment. She is requesting to see Dr Bryan Lemma rather then being seen sooner with Janett Billow. She states that patient went to [facility]  for a follow up wound consult and his staples were removed. That night patient's wound reopened leaving a 10x4  open area that needs constant dressing changes. She would like to have the wound healed before his follow up appointment. Appointment scheduled for 12/19/19.

## 2019-10-28 NOTE — Telephone Encounter (Signed)
Left message for patient to call  back to set up appointment.

## 2019-11-03 ENCOUNTER — Telehealth (HOSPITAL_COMMUNITY): Payer: Self-pay

## 2019-11-03 NOTE — Telephone Encounter (Signed)
Informed patients wife [name] that his disability paperwork was successfully faxed over to [phone]. Patients wife would like for originals to be mailed to address on file

## 2019-11-07 ENCOUNTER — Emergency Department (HOSPITAL_COMMUNITY): Payer: 59

## 2019-11-07 ENCOUNTER — Encounter (HOSPITAL_COMMUNITY): Payer: Self-pay | Admitting: Emergency Medicine

## 2019-11-07 ENCOUNTER — Telehealth: Payer: Self-pay | Admitting: Internal Medicine

## 2019-11-07 ENCOUNTER — Inpatient Hospital Stay (HOSPITAL_COMMUNITY)
Admission: EM | Admit: 2019-11-07 | Discharge: 2019-11-10 | DRG: 871 | Disposition: A | Discharge status: Home / Self Care | Payer: 59 | Attending: Internal Medicine | Admitting: Internal Medicine

## 2019-11-07 ENCOUNTER — Other Ambulatory Visit: Payer: Self-pay

## 2019-11-07 DIAGNOSIS — D62 Acute posthemorrhagic anemia: Secondary | ICD-10-CM | POA: Diagnosis not present

## 2019-11-07 DIAGNOSIS — I252 Old myocardial infarction: Secondary | ICD-10-CM | POA: Diagnosis not present

## 2019-11-07 DIAGNOSIS — Z955 Presence of coronary angioplasty implant and graft: Secondary | ICD-10-CM

## 2019-11-07 DIAGNOSIS — Z681 Body mass index (BMI) 19 or less, adult: Secondary | ICD-10-CM | POA: Diagnosis not present

## 2019-11-07 DIAGNOSIS — I959 Hypotension, unspecified: Secondary | ICD-10-CM

## 2019-11-07 DIAGNOSIS — D649 Anemia, unspecified: Secondary | ICD-10-CM | POA: Diagnosis present

## 2019-11-07 DIAGNOSIS — E782 Mixed hyperlipidemia: Secondary | ICD-10-CM

## 2019-11-07 DIAGNOSIS — N1831 Chronic kidney disease, stage 3a: Secondary | ICD-10-CM | POA: Diagnosis present

## 2019-11-07 DIAGNOSIS — E861 Hypovolemia: Secondary | ICD-10-CM | POA: Diagnosis present

## 2019-11-07 DIAGNOSIS — D631 Anemia in chronic kidney disease: Secondary | ICD-10-CM | POA: Diagnosis present

## 2019-11-07 DIAGNOSIS — K7200 Acute and subacute hepatic failure without coma: Secondary | ICD-10-CM | POA: Diagnosis present

## 2019-11-07 DIAGNOSIS — Z20822 Contact with and (suspected) exposure to covid-19: Secondary | ICD-10-CM | POA: Diagnosis present

## 2019-11-07 DIAGNOSIS — E039 Hypothyroidism, unspecified: Secondary | ICD-10-CM | POA: Diagnosis present

## 2019-11-07 DIAGNOSIS — E785 Hyperlipidemia, unspecified: Secondary | ICD-10-CM | POA: Diagnosis present

## 2019-11-07 DIAGNOSIS — E872 Acidosis: Secondary | ICD-10-CM | POA: Diagnosis present

## 2019-11-07 DIAGNOSIS — N179 Acute kidney failure, unspecified: Secondary | ICD-10-CM

## 2019-11-07 DIAGNOSIS — A419 Sepsis, unspecified organism: Secondary | ICD-10-CM | POA: Diagnosis present

## 2019-11-07 DIAGNOSIS — I272 Pulmonary hypertension, unspecified: Secondary | ICD-10-CM | POA: Diagnosis present

## 2019-11-07 DIAGNOSIS — R6520 Severe sepsis without septic shock: Secondary | ICD-10-CM | POA: Diagnosis not present

## 2019-11-07 DIAGNOSIS — Z87891 Personal history of nicotine dependence: Secondary | ICD-10-CM | POA: Diagnosis not present

## 2019-11-07 DIAGNOSIS — J90 Pleural effusion, not elsewhere classified: Secondary | ICD-10-CM | POA: Diagnosis present

## 2019-11-07 DIAGNOSIS — I5042 Chronic combined systolic (congestive) and diastolic (congestive) heart failure: Secondary | ICD-10-CM | POA: Diagnosis present

## 2019-11-07 DIAGNOSIS — A4152 Sepsis due to Pseudomonas: Principal | ICD-10-CM | POA: Diagnosis present

## 2019-11-07 DIAGNOSIS — E860 Dehydration: Secondary | ICD-10-CM | POA: Diagnosis present

## 2019-11-07 DIAGNOSIS — I130 Hypertensive heart and chronic kidney disease with heart failure and stage 1 through stage 4 chronic kidney disease, or unspecified chronic kidney disease: Secondary | ICD-10-CM | POA: Diagnosis present

## 2019-11-07 DIAGNOSIS — I2510 Atherosclerotic heart disease of native coronary artery without angina pectoris: Secondary | ICD-10-CM | POA: Diagnosis present

## 2019-11-07 DIAGNOSIS — N189 Chronic kidney disease, unspecified: Secondary | ICD-10-CM | POA: Diagnosis present

## 2019-11-07 DIAGNOSIS — E43 Unspecified severe protein-calorie malnutrition: Secondary | ICD-10-CM | POA: Diagnosis present

## 2019-11-07 DIAGNOSIS — N184 Chronic kidney disease, stage 4 (severe): Secondary | ICD-10-CM | POA: Diagnosis present

## 2019-11-07 LAB — LACTIC ACID, PLASMA
Lactic Acid, Venous: 3.1 mmol/L (ref 0.5–1.9)
Lactic Acid, Venous: 5 mmol/L (ref 0.5–1.9)
Lactic Acid, Venous: 3.2 mmol/L (ref 0.5–1.9)
Lactic Acid, Venous: 2.6 mmol/L (ref 0.5–1.9)

## 2019-11-07 LAB — APTT: aPTT: 37 seconds — ABNORMAL HIGH (ref 24–36)

## 2019-11-07 LAB — COMPREHENSIVE METABOLIC PANEL
ALT: 545 U/L — ABNORMAL HIGH (ref 0–44)
AST: 1062 U/L — ABNORMAL HIGH (ref 15–41)
Albumin: 2.7 g/dL — ABNORMAL LOW (ref 3.5–5.0)
Alkaline Phosphatase: 981 U/L — ABNORMAL HIGH (ref 38–126)
Anion gap: 12 (ref 5–15)
BUN: 50 mg/dL — ABNORMAL HIGH (ref 8–23)
CO2: 19 mmol/L — ABNORMAL LOW (ref 22–32)
Calcium: 8.2 mg/dL — ABNORMAL LOW (ref 8.9–10.3)
Chloride: 111 mmol/L (ref 98–111)
Creatinine, Ser: 2.95 mg/dL — ABNORMAL HIGH (ref 0.61–1.24)
GFR calc Af Amer: 25 mL/min — ABNORMAL LOW (ref >60)
GFR calc non Af Amer: 22 mL/min — ABNORMAL LOW (ref >60)
Glucose, Bld: 112 mg/dL — ABNORMAL HIGH (ref 70–99)
Potassium: 3.9 mmol/L (ref 3.5–5.1)
Sodium: 142 mmol/L (ref 135–145)
Total Bilirubin: 1.4 mg/dL — ABNORMAL HIGH (ref 0.3–1.2)
Total Protein: 6.3 g/dL — ABNORMAL LOW (ref 6.5–8.1)

## 2019-11-07 LAB — URINALYSIS, ROUTINE W REFLEX MICROSCOPIC
Bilirubin Urine: NEGATIVE
Glucose, UA: NEGATIVE mg/dL
Hgb urine dipstick: NEGATIVE
Ketones, ur: NEGATIVE mg/dL
Leukocytes,Ua: NEGATIVE
Nitrite: NEGATIVE
Protein, ur: NEGATIVE mg/dL
Specific Gravity, Urine: 1.012 (ref 1.005–1.030)
pH: 5 (ref 5.0–8.0)

## 2019-11-07 LAB — HEPATIC FUNCTION PANEL
ALT: 323 U/L — ABNORMAL HIGH (ref 0–44)
AST: 498 U/L — ABNORMAL HIGH (ref 15–41)
Albumin: 2 g/dL — ABNORMAL LOW (ref 3.5–5.0)
Alkaline Phosphatase: 769 U/L — ABNORMAL HIGH (ref 38–126)
Bilirubin, Direct: 1.4 mg/dL — ABNORMAL HIGH (ref 0.0–0.2)
Indirect Bilirubin: 0.4 mg/dL (ref 0.3–0.9)
Total Bilirubin: 1.8 mg/dL — ABNORMAL HIGH (ref 0.3–1.2)
Total Protein: 4.9 g/dL — ABNORMAL LOW (ref 6.5–8.1)

## 2019-11-07 LAB — CBC
HCT: 29.2 % — ABNORMAL LOW (ref 39.0–52.0)
Hemoglobin: 9 g/dL — ABNORMAL LOW (ref 13.0–17.0)
MCH: 29.3 pg (ref 26.0–34.0)
MCHC: 30.8 g/dL (ref 30.0–36.0)
MCV: 95.1 fL (ref 80.0–100.0)
Platelets: 171 10*3/uL (ref 150–400)
RBC: 3.07 MIL/uL — ABNORMAL LOW (ref 4.22–5.81)
RDW: 16 % — ABNORMAL HIGH (ref 11.5–15.5)
WBC: 19.3 10*3/uL — ABNORMAL HIGH (ref 4.0–10.5)
nRBC: 0 % (ref 0.0–0.2)

## 2019-11-07 LAB — PROTIME-INR
INR: 1.3 — ABNORMAL HIGH (ref 0.8–1.2)
Prothrombin Time: 15.5 seconds — ABNORMAL HIGH (ref 11.4–15.2)

## 2019-11-07 LAB — ACETAMINOPHEN LEVEL: Acetaminophen (Tylenol), Serum: 15 ug/mL (ref 10–30)

## 2019-11-07 LAB — LIPASE, BLOOD: Lipase: 49 U/L (ref 11–51)

## 2019-11-07 LAB — SARS CORONAVIRUS 2 BY RT PCR (HOSPITAL ORDER, PERFORMED IN ~~LOC~~ HOSPITAL LAB): SARS Coronavirus 2: NEGATIVE

## 2019-11-07 IMAGING — CT CT CHEST W/O CM
2 of 5 series · 14 of 46 positions shown, 16 images · non-contrast
Comparison: [DATE], [DATE]

CLINICAL DATA: Exploratory laparotomy [DATE], lysis of
adhesions, abdominal pain, chest pain, short of breath, nonhealing
laparotomy incision

EXAM:
CT CHEST, ABDOMEN AND PELVIS WITHOUT CONTRAST
TECHNIQUE: Multidetector CT imaging of the chest, abdomen and pelvis was
performed following the standard protocol without IV contrast.

[Series 5: cap w/o 3.0 mm st cor · coronal · non-contrast · 0.68mm/px · 3 of 102 slices shown]
[im 34/102  soft-tissue]
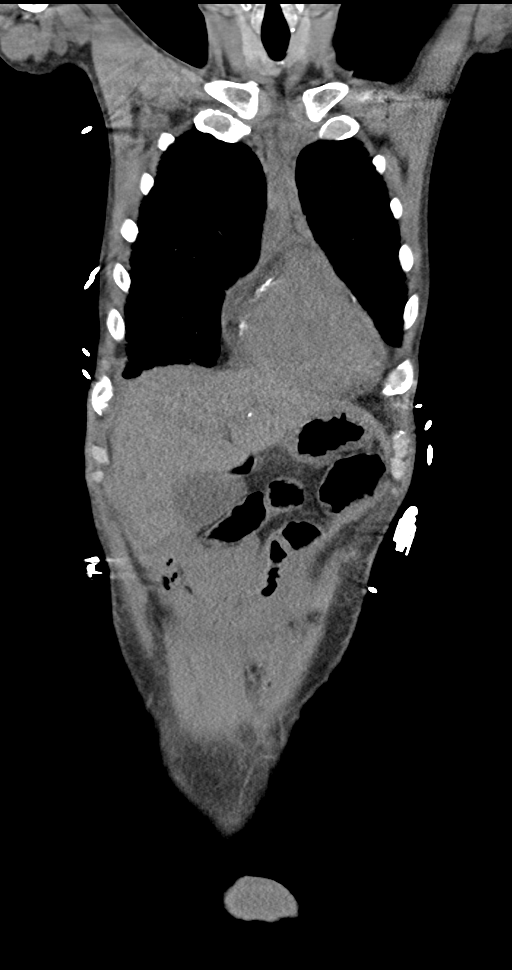
[im 45/102  soft-tissue]
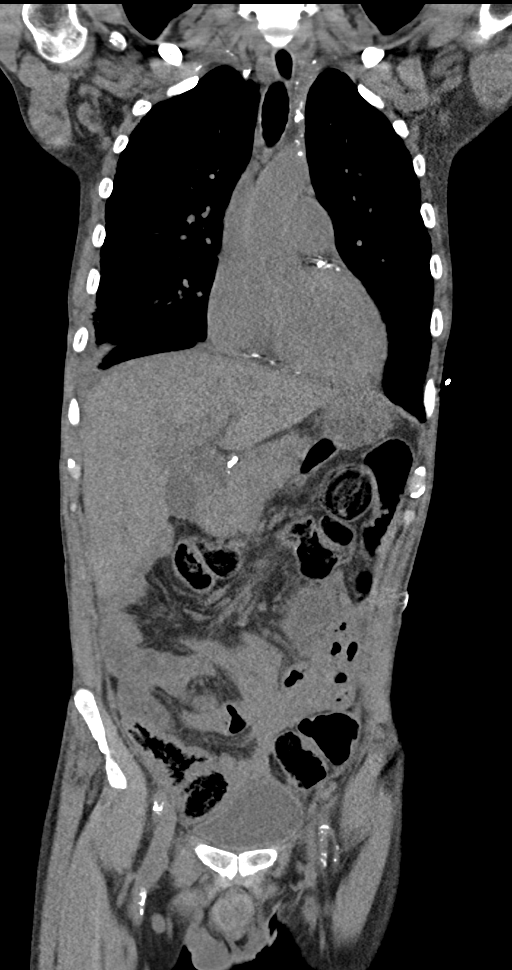
[im 57/102  soft-tissue]
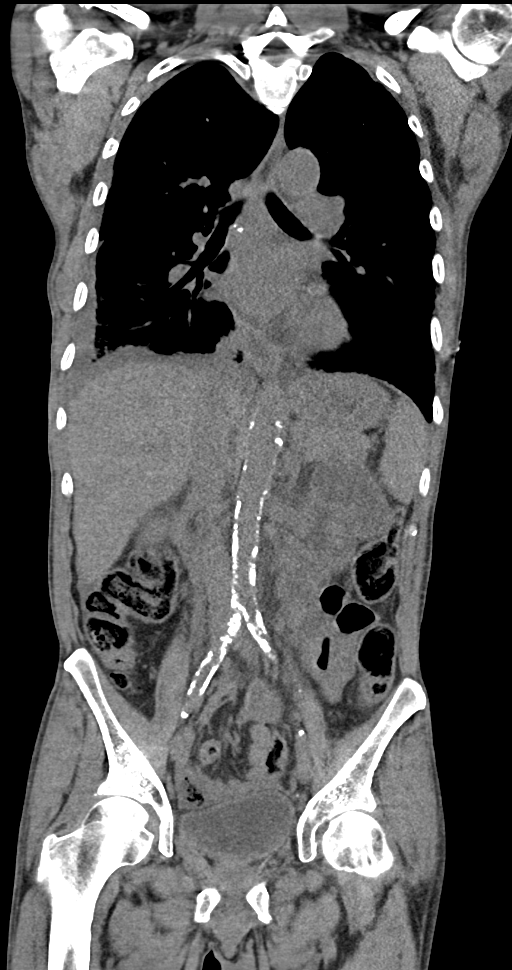

[Series 7: cap w/o 2.0 mm st · axial · non-contrast · 0.70mm/px · z∈[+934,+1514]mm · 11 of 332 slices shown, 13 images]
[im 21/332  soft-tissue]
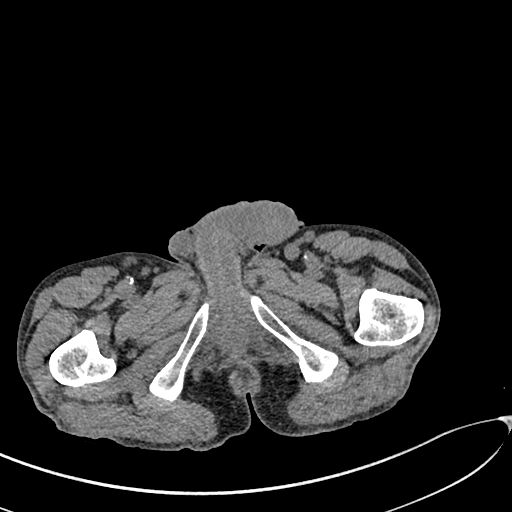
[im 21/332  bone]
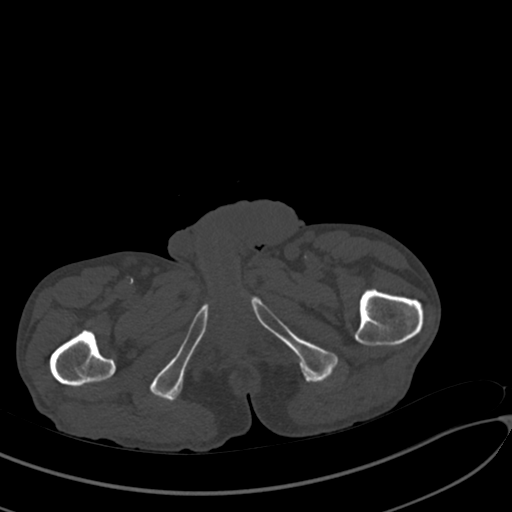
[im 63/332  soft-tissue]
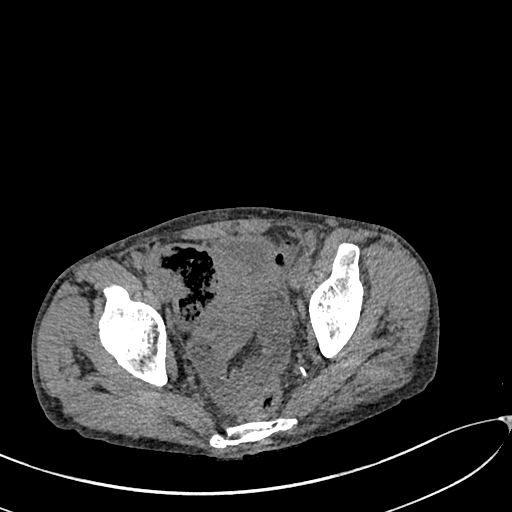
[im 83/332  soft-tissue]
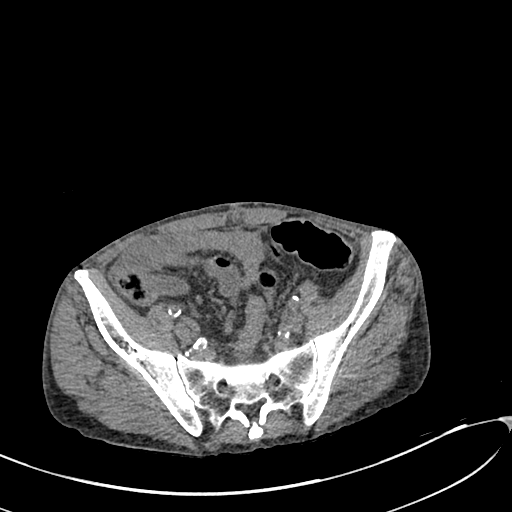
[im 104/332  soft-tissue]
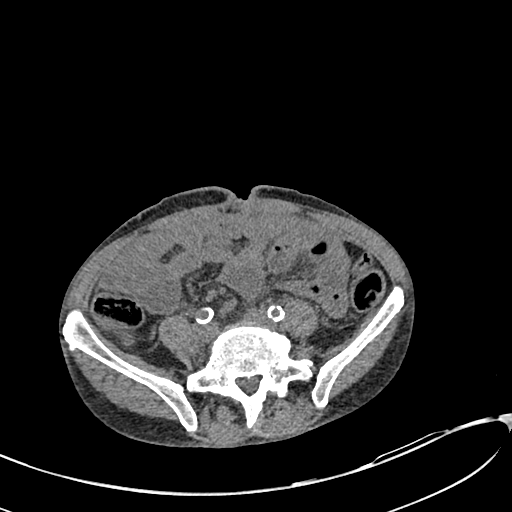
[im 145/332  soft-tissue]
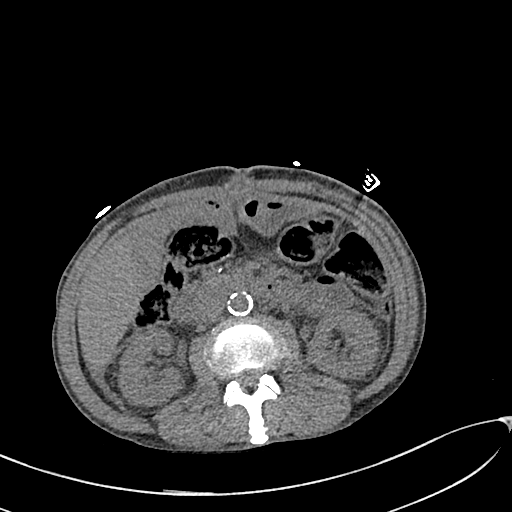
[im 166/332  soft-tissue]
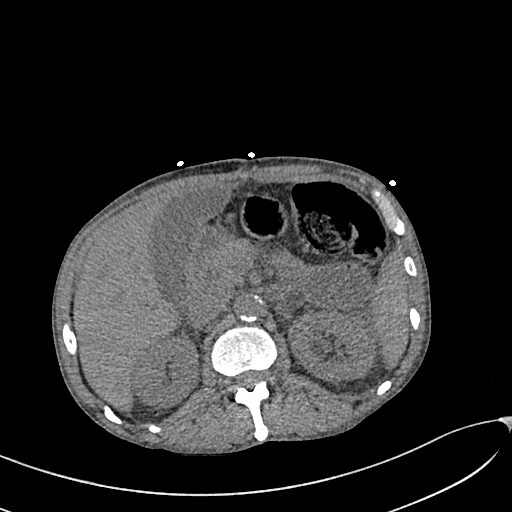
[im 187/332  soft-tissue]
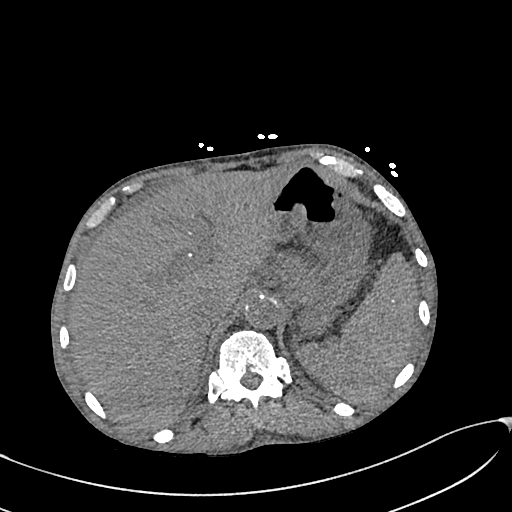
[im 228/332  soft-tissue]
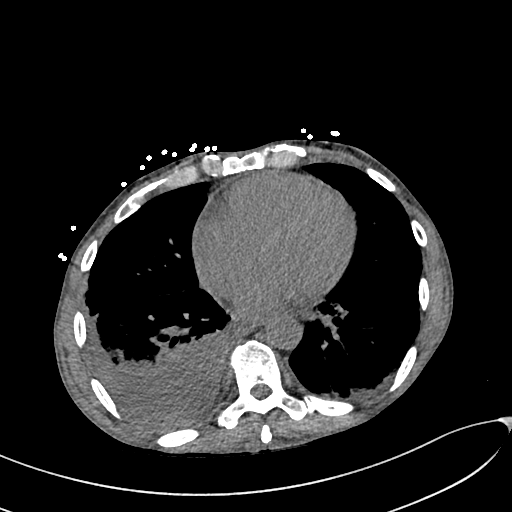
[im 249/332  soft-tissue]
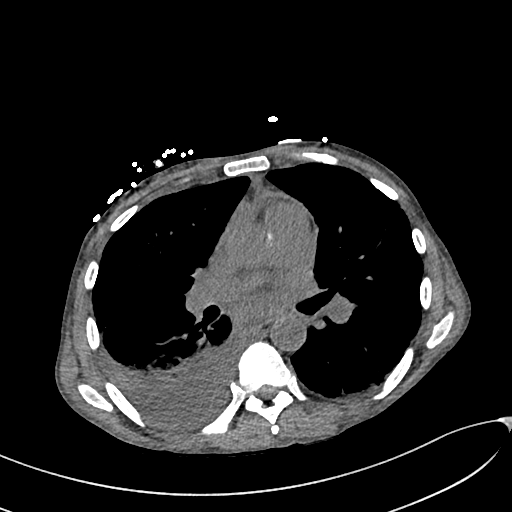
[im 249/332  bone]
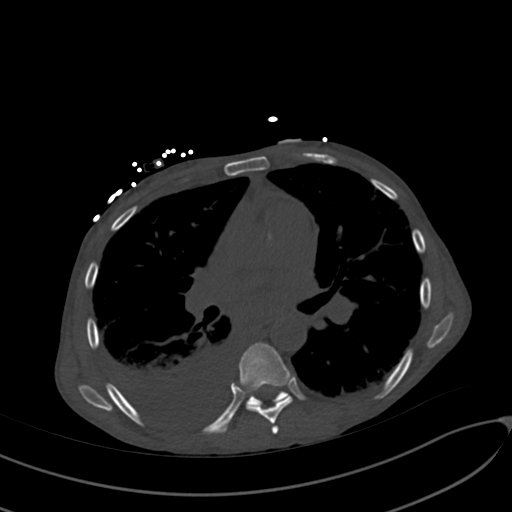
[im 269/332  soft-tissue]
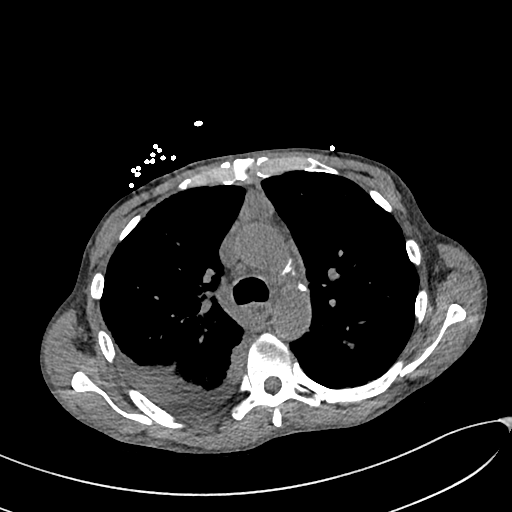
[im 311/332  soft-tissue]
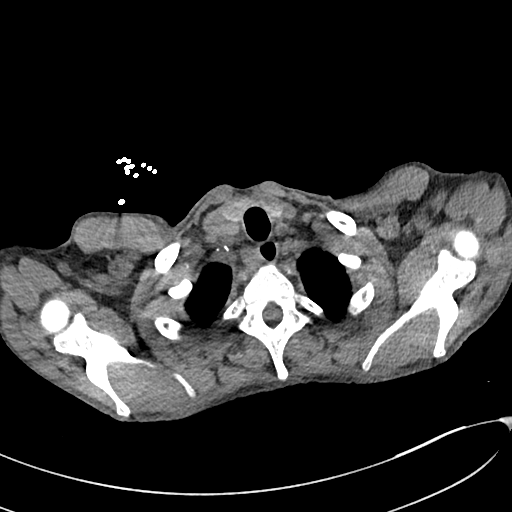

[14 of 46 positions shown; findings below may reference images not displayed]

FINDINGS: CT CHEST FINDINGS

Cardiovascular: Unenhanced imaging of the heart and great vessels
demonstrates no pericardial effusion. Moderate atherosclerosis of
the aorta and coronary vessels.

Mediastinum/Nodes: Borderline enlarged mediastinal lymph nodes are
seen, largest measuring 12 mm in short axis in the anterior
mediastinum. This is nonspecific. Thyroid, trachea, and esophagus
are unremarkable.

Lungs/Pleura: Moderate right pleural effusion volume estimated 1 L.
Dependent atelectasis within the right lung. There is upper lobe
predominant emphysema and scarring. No pneumothorax.

Musculoskeletal: No acute or destructive bony lesions. Reconstructed
images demonstrate no additional findings.

CT ABDOMEN PELVIS FINDINGS

Hepatobiliary: No focal liver abnormality is seen. No gallstones,
gallbladder wall thickening, or biliary dilatation.

Pancreas: Unremarkable. No pancreatic ductal dilatation or
surrounding inflammatory changes.

Spleen: Normal in size without focal abnormality.

Adrenals/Urinary Tract: No urinary tract calculi or obstructive
uropathy. The adrenals are unremarkable. The bladder is grossly
normal.

Stomach/Bowel: Evaluation of the bowel is limited by the lack of
intravenous and oral contrast. No bowel obstruction or ileus. No
bowel wall thickening or inflammatory change.

Vascular/Lymphatic: Aortic atherosclerosis. No enlarged abdominal or
pelvic lymph nodes.

Reproductive: Prostate is unremarkable.

Other: There is trace free fluid in the right upper quadrant. No
free intraperitoneal gas.

Surgical packing is seen within the midline laparotomy incision.
Minimal gas and fluid within the incision site without evidence of
large collection or abscess.

Musculoskeletal: No acute or destructive bony lesions. Reconstructed
images demonstrate no additional findings.
IMPRESSION: 1. Moderate right pleural effusion, with dependent right lower lobe
consolidation likely atelectasis.
2. Trace free fluid in the right upper quadrant.
3. Minimal gas and fluid within the midline laparotomy incision site
without evidence of large collection or abscess.
4. Borderline enlarged mediastinal lymph nodes, nonspecific.
5. Aortic Atherosclerosis ([YV]-[YV]) and Emphysema ([YV]-[YV]).

## 2019-11-07 IMAGING — CR DG CHEST 2V
2 series · 2 of 2 positions shown · non-contrast
Comparison: [DATE]

CLINICAL DATA: Vomiting.  RIGHT lower quadrant pain and

EXAM:
CHEST - 2 VIEW

[chest lat]
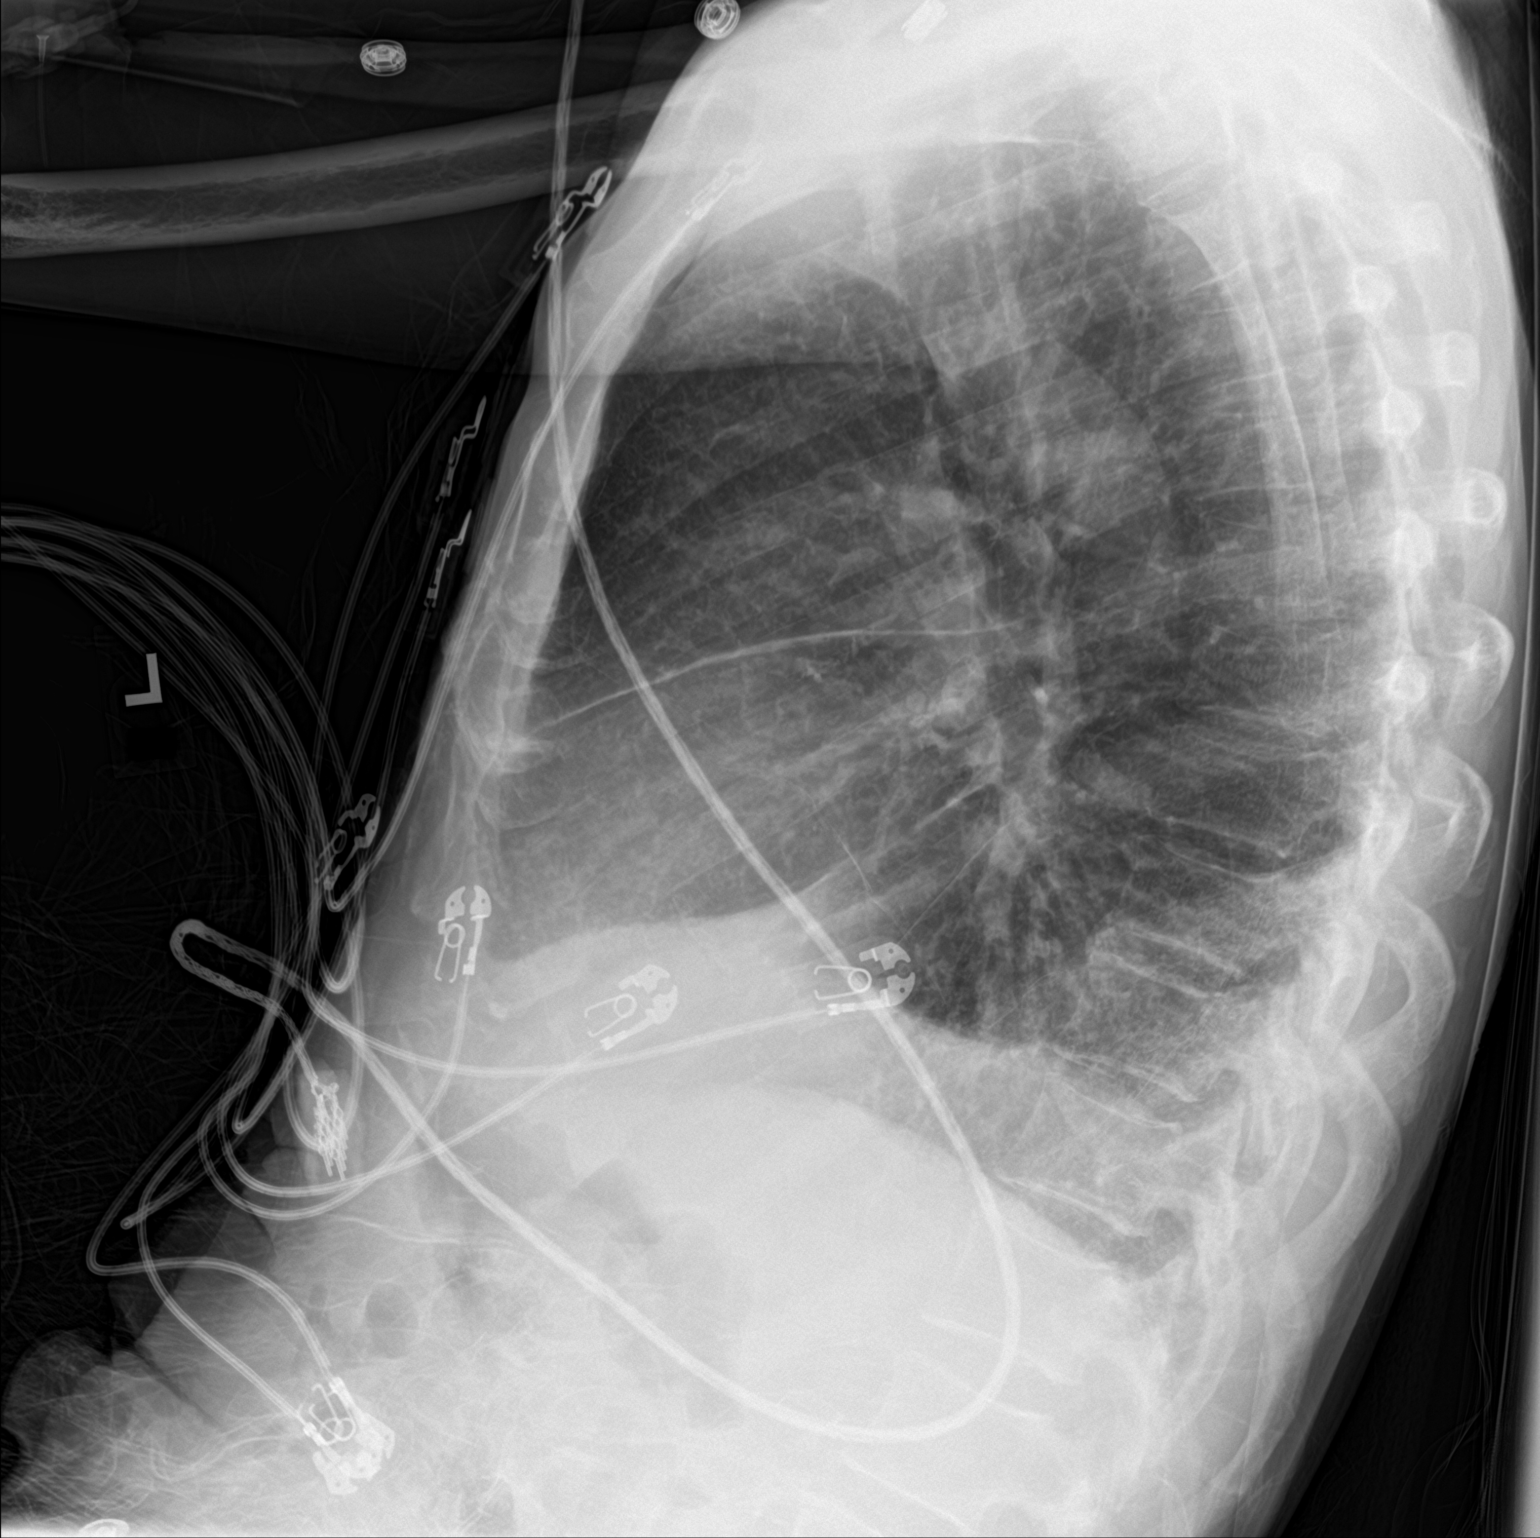

[chest ap]
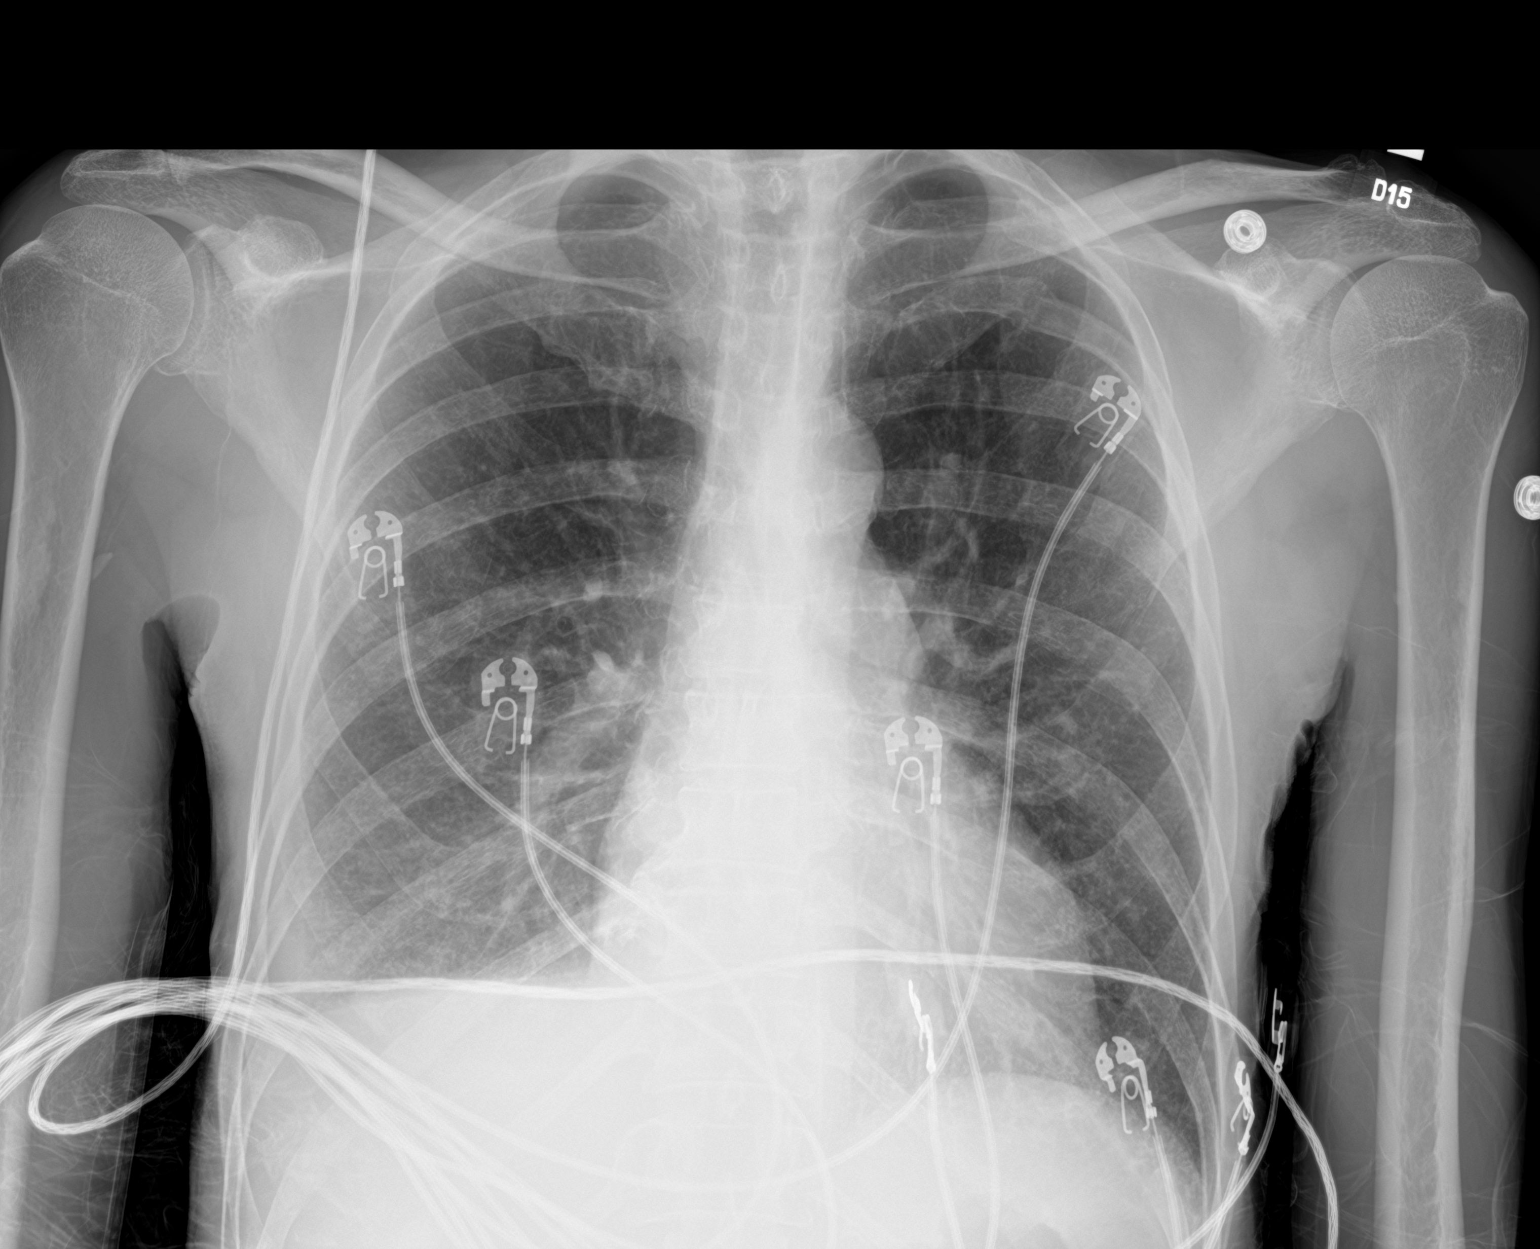

[2 of 2 positions shown; findings below may reference images not displayed]

FINDINGS: Normal cardiac silhouette. Chronic RIGHT pleural effusion noted. No
focal infiltrate. No pneumothorax.
IMPRESSION: Chronic RIGHT effusion.  No clear acute findings

## 2019-11-07 MED ORDER — VANCOMYCIN VARIABLE DOSE PER UNSTABLE RENAL FUNCTION (PHARMACIST DOSING): Status: DC

## 2019-11-07 MED ORDER — ESCITALOPRAM OXALATE 10 MG PO TABS
5.0000 mg | ORAL_TABLET | Freq: Every day | ORAL | Status: DC
  Administered 2019-11-08 – 2019-11-10 (×3): 5 mg via ORAL
  Filled 2019-11-07 (×3): qty 1

## 2019-11-07 MED ORDER — TRAZODONE HCL 50 MG PO TABS
25.0000 mg | ORAL_TABLET | Freq: Once | ORAL | Status: AC
  Administered 2019-11-07: 25 mg via ORAL
  Filled 2019-11-07: qty 1

## 2019-11-07 MED ORDER — SODIUM CHLORIDE 0.9 % IV SOLN
2.0000 g | INTRAVENOUS | Status: DC
  Administered 2019-11-08 – 2019-11-09 (×2): 2 g via INTRAVENOUS
  Filled 2019-11-07 (×3): qty 2

## 2019-11-07 MED ORDER — SODIUM CHLORIDE 0.9% FLUSH
3.0000 mL | Freq: Once | INTRAVENOUS | Status: AC
  Administered 2019-11-07: 3 mL via INTRAVENOUS

## 2019-11-07 MED ORDER — SODIUM CHLORIDE 0.9 % IV SOLN
2.0000 g | Freq: Once | INTRAVENOUS | Status: AC
  Administered 2019-11-07: 2 g via INTRAVENOUS
  Filled 2019-11-07: qty 2

## 2019-11-07 MED ORDER — METRONIDAZOLE IN NACL 5-0.79 MG/ML-% IV SOLN
500.0000 mg | Freq: Three times a day (TID) | INTRAVENOUS | Status: DC
  Administered 2019-11-07 – 2019-11-08 (×3): 500 mg via INTRAVENOUS
  Filled 2019-11-07 (×3): qty 100

## 2019-11-07 MED ORDER — VANCOMYCIN HCL 1250 MG/250ML IV SOLN
1250.0000 mg | Freq: Once | INTRAVENOUS | Status: AC
  Administered 2019-11-07: 1250 mg via INTRAVENOUS
  Filled 2019-11-07: qty 250

## 2019-11-07 MED ORDER — LACTATED RINGERS IV BOLUS (SEPSIS)
1000.0000 mL | Freq: Once | INTRAVENOUS | Status: AC
  Administered 2019-11-07: 1000 mL via INTRAVENOUS

## 2019-11-07 MED ORDER — ALLOPURINOL 100 MG PO TABS
100.0000 mg | ORAL_TABLET | Freq: Every day | ORAL | Status: DC
  Administered 2019-11-08 – 2019-11-10 (×3): 100 mg via ORAL
  Filled 2019-11-07 (×4): qty 1

## 2019-11-07 MED ORDER — LACTATED RINGERS IV BOLUS
1000.0000 mL | Freq: Once | INTRAVENOUS | Status: AC
  Administered 2019-11-07: 1000 mL via INTRAVENOUS

## 2019-11-07 MED ORDER — POTASSIUM CHLORIDE CRYS ER 20 MEQ PO TBCR
20.0000 meq | EXTENDED_RELEASE_TABLET | Freq: Every day | ORAL | Status: DC
  Administered 2019-11-08 – 2019-11-10 (×3): 20 meq via ORAL
  Filled 2019-11-07 (×3): qty 1

## 2019-11-07 MED ORDER — METRONIDAZOLE IN NACL 5-0.79 MG/ML-% IV SOLN
500.0000 mg | Freq: Once | INTRAVENOUS | Status: AC
  Administered 2019-11-07: 500 mg via INTRAVENOUS
  Filled 2019-11-07: qty 100

## 2019-11-07 MED ORDER — ACETAMINOPHEN 500 MG PO TABS
1000.0000 mg | ORAL_TABLET | Freq: Once | ORAL | Status: AC
  Administered 2019-11-07: 1000 mg via ORAL
  Filled 2019-11-07: qty 2

## 2019-11-07 MED ORDER — PANTOPRAZOLE SODIUM 40 MG PO TBEC
40.0000 mg | DELAYED_RELEASE_TABLET | Freq: Every day | ORAL | Status: DC
  Administered 2019-11-08 – 2019-11-10 (×3): 40 mg via ORAL
  Filled 2019-11-07 (×3): qty 1

## 2019-11-07 MED ORDER — ENOXAPARIN SODIUM 30 MG/0.3ML ~~LOC~~ SOLN
30.0000 mg | SUBCUTANEOUS | Status: DC
  Administered 2019-11-07 – 2019-11-09 (×3): 30 mg via SUBCUTANEOUS
  Filled 2019-11-07 (×3): qty 0.3

## 2019-11-07 NOTE — ED Notes (Signed)
Patient transported to CT. 

## 2019-11-07 NOTE — ED Triage Notes (Signed)
Pt arrives with family member who helps provides history, states that patient had abdominal surgery at the beginning of the month for an intestinal blockage, began have RLQ pain and vomiting yesterday. Denies fevers or chills, denies bloody emesis or stools, no urinary complaints.

## 2019-11-07 NOTE — ED Notes (Signed)
Patient presents to ed  States he started c/o right lower quad pain and generalized bodyaches, states he started vomiting last pm and stopped around 1am. States he ate a sausage gravy biscuit. And soda yest and thinks that's what made him sick. Patient is alert oriented. Denies urinary ro bowel sx. No pain at present.

## 2019-11-07 NOTE — Telephone Encounter (Signed)
Spoke to Mrs Basnett this morning who states that patient vomited 8 times prior to 1 AM. No further vomiting since. He has tolerated small sips of water,but still feels very weak,no fever noted. She will contact the surgeon's office this morning to inform them of patients current condition,she is trying to get a surgical follow up with them today We do not have any office visits available today The wife will go to the emergency department if patient starts vomiting again, in the meantime,she was wondering if patient should get labs today. Please advise

## 2019-11-07 NOTE — Progress Notes (Signed)
Asked by hospitalist to assess pt for ICU needs.   Briefly - 59yo male with hx HTN, CAD, CKD, recent exp lap with LOA 10/06/19 presents with 24hr nausea/vomiting, low grade fever, malaise.  Initially hypotensive in ER with lactate 5.  Cleared to 2.6 with 4L fluid, current BP 105/68.  Pt seen and examined. Family in room.   Awake, alert, denies pain, breathing comfortably on RA. Nontoxic appearing.   No need for ICU admission at this time.  OK for hospitalist admission to SDU. Monitor BP carefully, small volume boluses if further volume needed. Abx, trend chem, surgery input.   PCCM available PRN.   Nickolas Madrid, NP Pulmonary/Critical Care Medicine  11/07/2019  8:43 PM
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
## 2019-11-07 NOTE — Telephone Encounter (Signed)
Mrs Lamons has just called back stating that patient is feeling worse and very nauseated. She was advised to take patient to the Emergency department for evaluation. She agreed and per her request  will go to [facility].

## 2019-11-07 NOTE — Telephone Encounter (Signed)
Based on his clinical history and recent surgery, with the ongoing symptoms, agree with recommendation to go to [facility] ER.
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
## 2019-11-07 NOTE — Progress Notes (Addendum)
Addendum  - Vancomycin added per admitting team - Vancomycin 1232m IV x 1 dose  - Acute on chronic kidney failure will dose based on levels 2/2 unstable renal fxn  Pharmacy Antibiotic Note  WHERLEY BERNARDINIis a 59y.o. male admitted on 11/07/2019 with possible intra-abdominal infection s/p SBO with exlap and LOA.  Pharmacy has been consulted for cefepime dosing.  Flagyl per MD  Plan: Cefepime 2g IV every 24 hours Monitor renal function, Cx and clinical progression     Temp (24hrs), Avg:103 F (39.4 C), Min:103 F (39.4 C), Max:103 F (39.4 C)  Recent Labs  Lab 11/07/19 1048 11/07/19 1058  WBC 19.3*  --   LATICACIDVEN  --  3.1*    CrCl cannot be calculated (Patient's most recent lab result is older than the maximum 21 days allowed.).    No Known Allergies  JBertis Ruddy PharmD Clinical Pharmacist ED Pharmacist Phone # [phone]/16/2021 12:01 PM

## 2019-11-07 NOTE — Telephone Encounter (Signed)
Called by patient's wife.   Patient with recurrent nausea vomiting after eating dinner Vomiting bilious, nonbloody  Recent surgery with exploratory laparotomy and lysis of adhesions  She is requesting an appointment I advised that if he continues vomiting given recent surgery he needs to go to the emergency department.  It does not appear that he did so as her call to me was at 1 AM I also advised that she notify the surgeon who performed his surgery as this may be the most appropriate follow-up spot  Please check on the patient and determine if office visit is needed today versus emergency department versus surgical follow-up

## 2019-11-07 NOTE — ED Provider Notes (Signed)
  Physical Exam  BP (!) 100/54   Pulse 96   Temp 99.6 F (37.6 C) (Oral)   Resp 16   SpO2 100%   Physical Exam  ED Course/Procedures   Clinical Course as of Nov 06 1856  Fri Nov 07, 2019  1405 Recheck: Patient is alert and nontoxic.  He does not have any pain complaints.  Blood pressure on monitor shows 90 systolic   [MP]    Clinical Course User Index [MP] Charlesetta Shanks, MD    .Critical Care Performed by: Gareth Morgan, MD Authorized by: Gareth Morgan, MD   Critical care provider statement:    Critical care time (minutes):  30   Critical care was time spent personally by me on the following activities:  Discussions with consultants, evaluation of patient's response to treatment, examination of patient, ordering and performing treatments and interventions, ordering and review of laboratory studies, ordering and review of radiographic studies, pulse oximetry, re-evaluation of patient's condition, obtaining history from patient or surrogate and review of old charts    MDM   Received care of patient from Dr. Johnney Killian at 330PM. Please see her note for history and physical exam. Presented with concern for nausea/vomiting. Febrile to 707, BP 61H systolic initially and labs consistent with lactate 5, severe transaminitis, leukocytosis. CT pending.  CT shows no acute infectious findings or signs of biliary disease. UA still pending and in and out catheterization ordered.  Given additional fluid for BP continued in 90s.  Received 4L and BP 183U systolic, normal saturation on room air.  Maintaining MAPs above 65.  Discussed with hospitalist who recommended consult to PCCM. Discussed with PCCM and will plan on hospitalist admission to step down unit.    Gareth Morgan, MD 11/08/19 1057

## 2019-11-08 ENCOUNTER — Inpatient Hospital Stay (HOSPITAL_COMMUNITY): Payer: 59

## 2019-11-08 DIAGNOSIS — R6520 Severe sepsis without septic shock: Secondary | ICD-10-CM

## 2019-11-08 DIAGNOSIS — K7200 Acute and subacute hepatic failure without coma: Secondary | ICD-10-CM

## 2019-11-08 DIAGNOSIS — A419 Sepsis, unspecified organism: Secondary | ICD-10-CM

## 2019-11-08 LAB — GRAM STAIN

## 2019-11-08 LAB — BLOOD CULTURE ID PANEL (REFLEXED)

## 2019-11-08 LAB — CBC
HCT: 27.9 % — ABNORMAL LOW (ref 39.0–52.0)
Hemoglobin: 8.6 g/dL — ABNORMAL LOW (ref 13.0–17.0)
MCH: 29.9 pg (ref 26.0–34.0)
MCHC: 30.8 g/dL (ref 30.0–36.0)
MCV: 96.9 fL (ref 80.0–100.0)
Platelets: 166 10*3/uL (ref 150–400)
RBC: 2.88 MIL/uL — ABNORMAL LOW (ref 4.22–5.81)
RDW: 16.2 % — ABNORMAL HIGH (ref 11.5–15.5)
WBC: 14.8 10*3/uL — ABNORMAL HIGH (ref 4.0–10.5)
nRBC: 0 % (ref 0.0–0.2)

## 2019-11-08 LAB — COMPREHENSIVE METABOLIC PANEL
ALT: 238 U/L — ABNORMAL HIGH (ref 0–44)
AST: 252 U/L — ABNORMAL HIGH (ref 15–41)
Albumin: 2.1 g/dL — ABNORMAL LOW (ref 3.5–5.0)
Alkaline Phosphatase: 679 U/L — ABNORMAL HIGH (ref 38–126)
Anion gap: 13 (ref 5–15)
BUN: 48 mg/dL — ABNORMAL HIGH (ref 8–23)
CO2: 18 mmol/L — ABNORMAL LOW (ref 22–32)
Calcium: 7.2 mg/dL — ABNORMAL LOW (ref 8.9–10.3)
Chloride: 110 mmol/L (ref 98–111)
Creatinine, Ser: 2.44 mg/dL — ABNORMAL HIGH (ref 0.61–1.24)
GFR calc Af Amer: 32 mL/min — ABNORMAL LOW (ref >60)
GFR calc non Af Amer: 28 mL/min — ABNORMAL LOW (ref >60)
Glucose, Bld: 67 mg/dL — ABNORMAL LOW (ref 70–99)
Potassium: 4 mmol/L (ref 3.5–5.1)
Sodium: 141 mmol/L (ref 135–145)
Total Bilirubin: 1.1 mg/dL (ref 0.3–1.2)
Total Protein: 5 g/dL — ABNORMAL LOW (ref 6.5–8.1)

## 2019-11-08 LAB — HEPATITIS PANEL, ACUTE
HCV Ab: NONREACTIVE
Hep A IgM: NONREACTIVE
Hep B C IgM: NONREACTIVE
Hepatitis B Surface Ag: NONREACTIVE

## 2019-11-08 LAB — PROTEIN, PLEURAL OR PERITONEAL FLUID: Total protein, fluid: <3 g/dL

## 2019-11-08 LAB — LACTIC ACID, PLASMA: Lactic Acid, Venous: 1.2 mmol/L (ref 0.5–1.9)

## 2019-11-08 IMAGING — DX DG CHEST 1V
1 series · 1 of 1 positions shown · non-contrast
Comparison: [DATE]

CLINICAL DATA: 59-year-old male with a history thoracentesis

EXAM:
CHEST  1 VIEW

[chest ap]
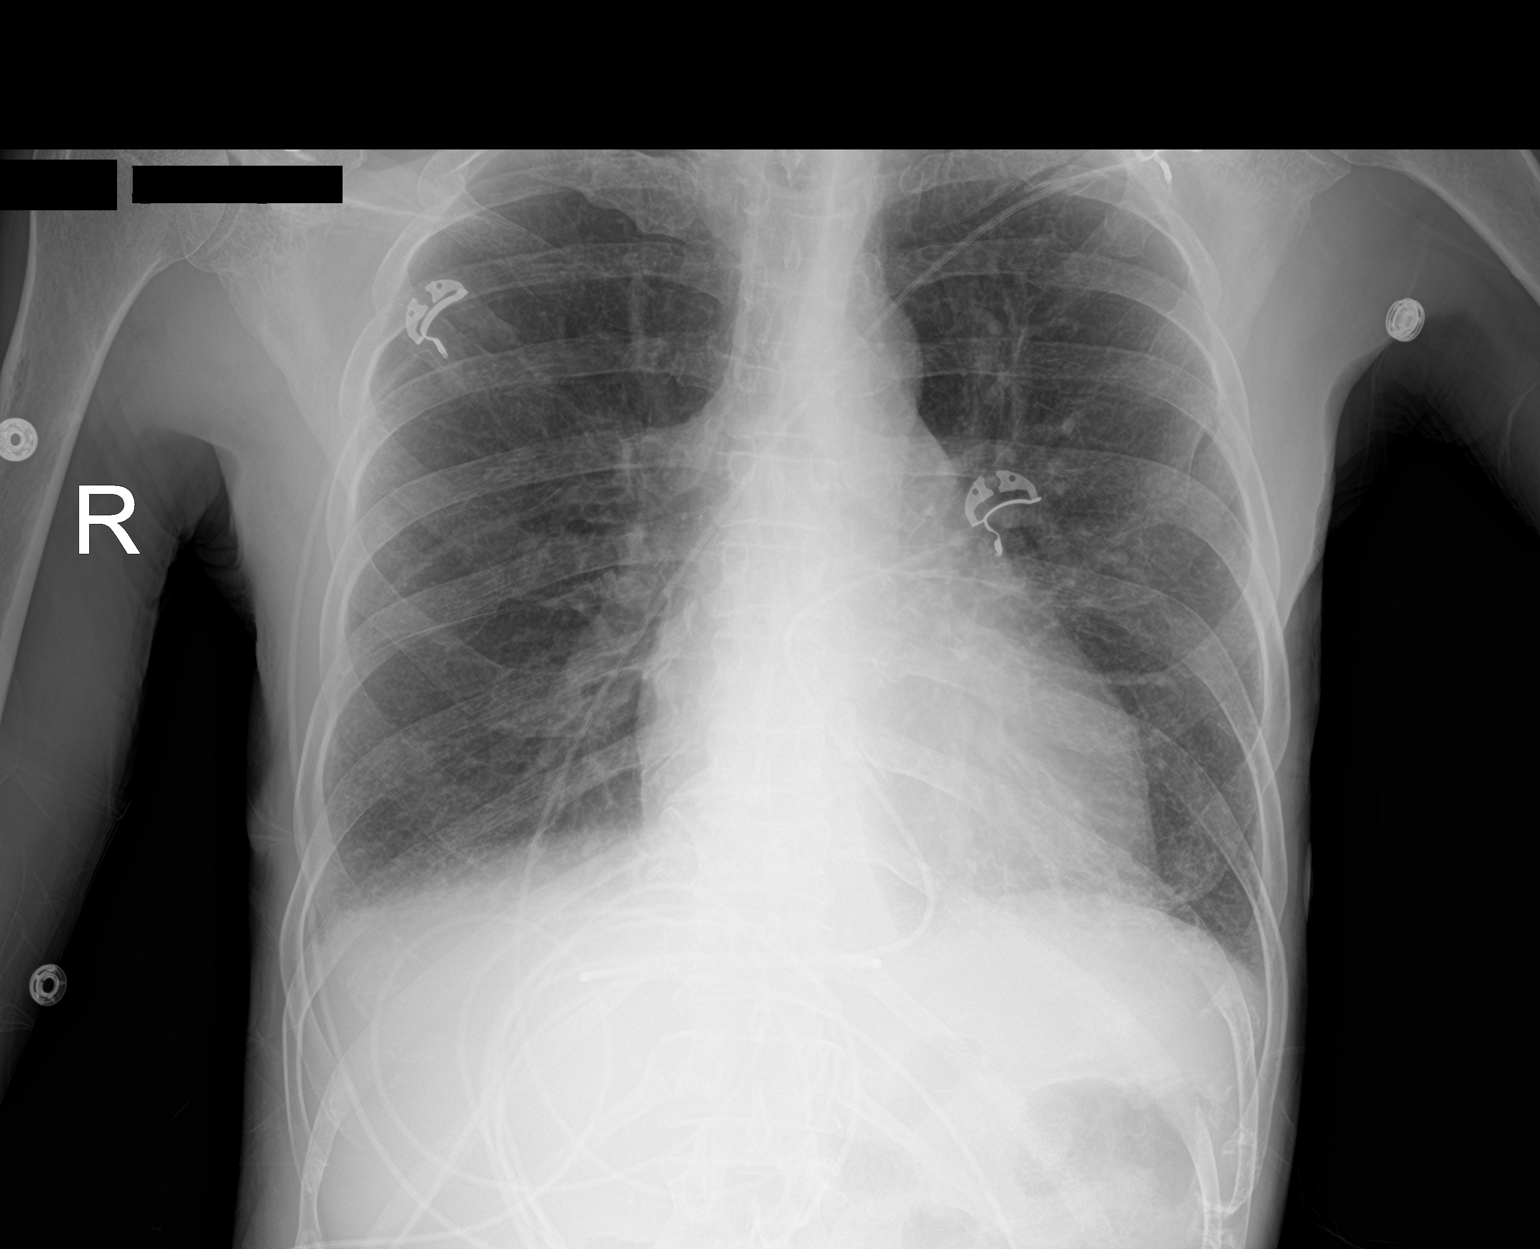

[1 of 1 positions shown; findings below may reference images not displayed]

FINDINGS: Cardiomediastinal silhouette unchanged in size and contour.

Similar appearance of reticular opacities of the bilateral lungs
with no new confluent airspace disease.

No pneumothorax.

Blunting of the right costophrenic angle.

No left-sided pleural fluid.
IMPRESSION: No pneumothorax status post thoracentesis, with trace pleural fluid
persisting.

Similar appearance of the lungs with coarsened interstitial
markings.

## 2019-11-08 IMAGING — US US THORACENTESIS ASP PLEURAL SPACE W/IMG GUIDE
1 series · 1 of 1 positions shown · non-contrast
Comparison: none

INDICATION: Right pleural effusion

[Series 1: us thoracentesis asp pleural space w/img guide · 1 of 1 slices shown]
[im 1/1]
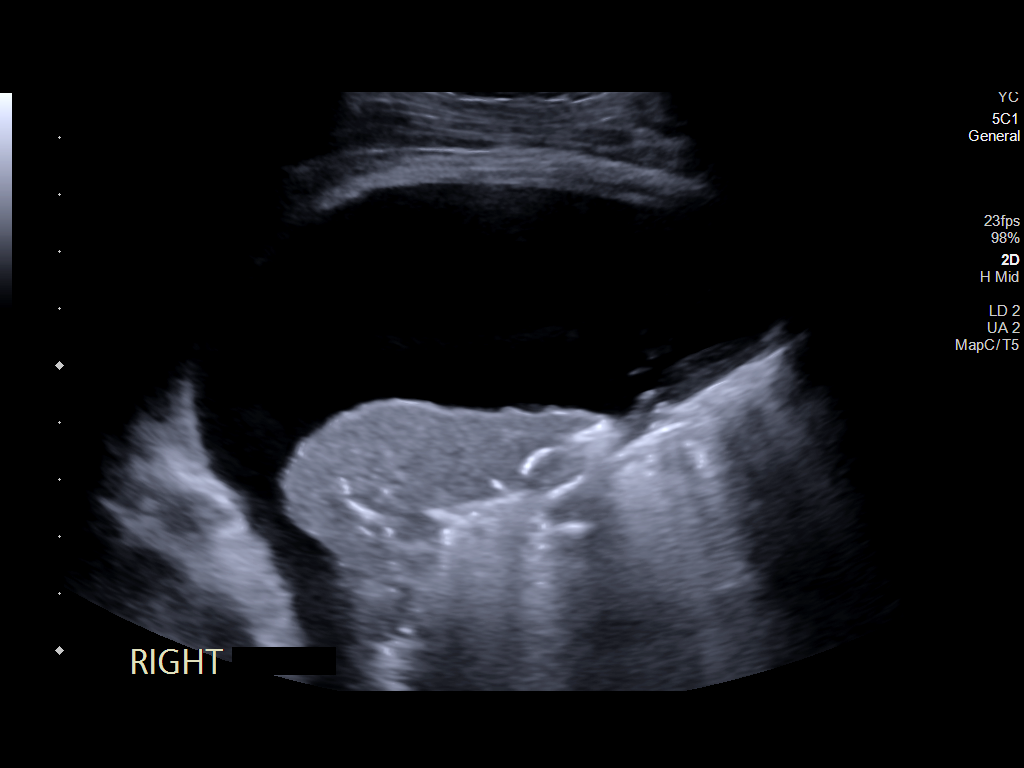

[1 of 1 positions shown; findings below may reference images not displayed]

EXAM:
ULTRASOUND GUIDED Right THORACENTESIS

MEDICATIONS:
9 cc 1% lidocaine.

COMPLICATIONS:
None immediate.

PROCEDURE:
An ultrasound guided thoracentesis was thoroughly discussed with the
patient and questions answered. The benefits, risks, alternatives
and complications were also discussed. The patient understands and
wishes to proceed with the procedure. Written consent was obtained.

Ultrasound was performed to localize and mark an adequate pocket of
fluid in the right chest. The area was then prepped and draped in
the normal sterile fashion. 1% Lidocaine was used for local
anesthesia. Under ultrasound guidance a 19 G Yueh catheter was
introduced. Thoracentesis was performed. The catheter was removed
and a dressing applied.
FINDINGS: A total of approximately 850 cc of clear yellow fluid was removed.
IMPRESSION: Successful ultrasound guided right thoracentesis yielding 850 cc of
pleural fluid.

Read by

CAPILLA

## 2019-11-08 MED ORDER — LIDOCAINE HCL (PF) 1 % IJ SOLN
INTRAMUSCULAR | Status: AC
  Filled 2019-11-08: qty 30

## 2019-11-08 MED ORDER — SODIUM CHLORIDE 0.9 % IV SOLN: INTRAVENOUS | Status: DC

## 2019-11-08 NOTE — Consult Note (Signed)
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 gastrointestinal tract   . Gastritis and gastroduodenitis   . Adenomatous polyp of transverse colon   . Anemia 08/29/2018  . Symptomatic anemia 08/28/2018  . Acute renal failure superimposed on chronic kidney disease ([place]) 08/28/2018  . GI bleed 08/28/2018  . Chronic systolic (congestive) heart failure ([place])   . Melena   . Pulmonary hypertension ([place]) 06/18/2018  . Left ventricular dysfunction 06/18/2018  . Raynaud's syndrome without gangrene 05/24/2016  . Acute CHF (congestive heart failure) ([place]) 02/24/2016  . Essential hypertension 02/24/2016  . Pericardial effusion 05/01/2014  . Hyperlipidemia  10/18/2012  . CAD S/P percutaneous coronary angioplasty 04/26/2012  . Gout 04/26/2012  . Vitiligo   . Smoker 06/16/2011  . Chronic renal insufficiency, stage 3 (moderate) 06/16/2011  . 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 Scleroderma ([place]) 06/15/2011    Assessment & Plan: WOB improved with thoracentesis;  Would look to atelectasis and underlying consolidation of lung --hopefully with thoracentesis there will be expansion.  His abdomen is flat and nontender and I don't see a post laparotomy cause for his pseudomonas bacteremia other than his lung.      Matt B. Hassell Done, MD, [facility] [phone] beeper [phone]  11/08/2019 10:04 AM

## 2019-11-08 NOTE — Progress Notes (Signed)
PROGRESS NOTE    [name]  VQQ:[id number] DOB: 1960-06-23 DOA: 11/07/2019 PCP: [name], MD   Brief Narrative:  Patient is a 59 year old male with history of coronary artery disease status post RCA stent, chronic systolic heart failure, pericarditis effusion status post pericardial window 2010, hypertension, asthma, AVM with history of GI bleed, CKD stage IIIa, hyperlipidemia, recent abdominal surgery who presents with nausea and vomiting.  He denies any diarrhea or abdominal pain.  Recently had exploratory laparotomy with lysis of adhesions by Dr. Donne Hazel on 10/06/2019 following SBO.  No history of alcohol abuse.  On presentation he was febrile, tachycardic, hypotensive.  Leukocytosis of 19.3.  He had elevated lactic acid at 5 which improved with IV fluids.  Found to have AKI, elevated AST and ALT.  CT chest/abdomen/pelvis showed moderate right pleural effusion with around 1 L of  fluid, no focal liver abnormalities or gallbladder pathology.  No gas or fluid collection.  Hepatitis panel is negative.  Patient has been started on broad-spectrum antibiotics.  Assessment & Plan:   Principal Problem:   Sepsis ([place]) Active Problems:   Hyperlipidemia   Acute renal failure superimposed on chronic kidney disease (HCC)   Anemia   Pleural effusion   Sepsis associated hypotension (HCC)   Suspected sepsis secondary to intra-abdominal source: Recently had exploratory laparotomy with lysis of adhesions for SBO.  Presented with hypotension, elevated lactic acid level.  Blood pressure responded to IV fluids.  Lactic acid has improved.  Continue continue antibiotics.  Follow-up cultures.  Abdominal imaging did not show any acute intra-abdominal findings.  Gram-negative bacteremia: Blood cultures had showed Pseudomonas aeruginosa.  We will continue cefepime for now.  Follow-up cultures.  Source could be abdominal but CT imaging does not show any abdominal findings.He has leucocytosis which is  improving  Elevated liver enzymes: On reviewing his previous records, he had elevated liver enzymes in 10/2018 which later resolved .  He was seeing Dr. Bryan Lemma as an outpatient.  Extensive work-up was done which were mostly unrevealing aside from elevated ANA.  Patient has history of scleroderma.  Acute elevation cud be associated  with shock liver from hypotension/sepsis.  Liver function is improving.  Hepatitis panel is negative.  Moderate right pleural effusion: Ultrasound-guided thoracentesis has been ordered.Will follow up fluid analysis.  It could be associated with diastolic congestive heart failure  AKI on CKD stage IIIa: Prerenal due to hypovolemia.  Continue to monitor BMP.  Continue gentle IV fluids.  His baseline creatinine ranges from 1.8-2.5.  During his last hospitalization his creatinine peaked up to 7.5.  He follows with [facility].  Diastolic congestive heart failure: Currently he is euvolemic or mildly dehydrated.  His echo on 1/21 showed ejection fraction of 43%, grade 2 diastolic dysfunction  Midline abdominal wound, recent exploratory laparotomy with lysis of adhesions: No clear signs of infection.  Wound care consulted.  General surgery doesnot think there is any acute concerns regarding his postsurgical status.  Hyperlipidemia: Statin on hold due to elevated liver enzymes  Chronic anemia: Stable  History of coronary artery  disease: Status post stent.  On statin at home which is on hold.  Severe malnutrition: BMI of 17.6.  Significant muscle and subcutaneous fat loss.  Dietitian consultation  History of pulmonary hypertension: On Opsumit and revatio at home  Normocytic anemia: Most likely associated with chronic medical conditions including CKD.  Continue to monitor.          DVT prophylaxis:Lovenox Code Status: Full Family  Communication: None present at bedside Status is: Inpatient  Remains inpatient appropriate because:Hemodynamically  unstable   Dispo: The patient is from: Home              Anticipated d/c is to: Home              Anticipated d/c date is: 3 days              Patient currently is not medically stable to d/c.    Consultants: General surgery  Procedures:None  Antimicrobials:  Anti-infectives (From admission, onward)   Start     Dose/Rate Route Frequency Ordered Stop   11/08/19 1200  ceFEPIme (MAXIPIME) 2 g in sodium chloride 0.9 % 100 mL IVPB     Discontinue     2 g 200 mL/hr over 30 Minutes Intravenous Every 24 hours 11/07/19 1203     11/07/19 2145  vancomycin (VANCOREADY) IVPB 1250 mg/250 mL        1,250 mg 166.7 mL/hr over 90 Minutes Intravenous  Once 11/07/19 2132 11/08/19 0029   11/07/19 2132  vancomycin variable dose per unstable renal function (pharmacist dosing)     Discontinue      Does not apply See admin instructions 11/07/19 2132     11/07/19 2100  metroNIDAZOLE (FLAGYL) IVPB 500 mg     Discontinue     500 mg 100 mL/hr over 60 Minutes Intravenous Every 8 hours 11/07/19 2058     11/07/19 1200  ceFEPIme (MAXIPIME) 2 g in sodium chloride 0.9 % 100 mL IVPB        2 g 200 mL/hr over 30 Minutes Intravenous  Once 11/07/19 1148 11/07/19 1534   11/07/19 1200  metroNIDAZOLE (FLAGYL) IVPB 500 mg        500 mg 100 mL/hr over 60 Minutes Intravenous  Once 11/07/19 1148 11/07/19 1402      Subjective: Patient seen and examined at the bedside this morning.  Hemodynamically stable.  Just came from the thoracentesis.  Denies any shortness of breath or chest pain.  Lungs were clear on auscultation.  Denies any abdominal pain.  No diarrhea or vomiting or nausea.  Objective: Vitals:   11/08/19 0120 11/08/19 0200 11/08/19 0211 11/08/19 0423  BP:   110/61 109/64  Pulse:    85  Resp:   18 18  Temp: 98.7 F (37.1 C)  99 F (37.2 C) 99.1 F (37.3 C)  TempSrc: Oral  Oral Oral  SpO2:   100% 97%  Weight:  49.7 kg    Height:  _0  (1.676 m)      Intake/Output Summary (Last 24 hours) at 11/08/2019  2831 Last data filed at 11/08/2019 5176 Gross per 24 hour  Intake 1549.65 ml  Output --  Net 1549.65 ml   Filed Weights   11/07/19 2100 11/08/19 0200  Weight: 56.3 kg 49.7 kg    Examination:  General exam: Deconditioned, debilitated male, malnourished HEENT:PERRL,Oral mucosa moist, Ear/Nose normal on gross exam Respiratory system: Bilateral equal air entry, normal vesicular breath sounds, no wheezes or crackles .  Mildly decreased air entry on the right side Cardiovascular system: S1 & S2 heard, RRR. No JVD, murmurs, rubs, gallops or clicks. No pedal edema. Gastrointestinal system: Abdomen is nondistended, soft and nontender. No organomegaly or masses felt. Normal bowel sounds heard.  Clean surgical wound Central nervous system: Alert and oriented. No focal neurological deficits. Extremities: No edema, no clubbing ,no cyanosis, distal peripheral pulses palpable. Skin: No rashes, lesions or ulcers,no  icterus ,no pallor   Data Reviewed: I have personally reviewed following labs and imaging studies  CBC: Recent Labs  Lab 11/07/19 1048  WBC 19.3*  HGB 9.0*  HCT 29.2*  MCV 95.1  PLT 154   Basic Metabolic Panel: Recent Labs  Lab 11/07/19 1048  NA 142  K 3.9  CL 111  CO2 19*  GLUCOSE 112*  BUN 50*  CREATININE 2.95*  CALCIUM 8.2*   GFR: Estimated Creatinine Clearance: 18.5 mL/min (A) (by C-G formula based on SCr of 2.95 mg/dL (H)). Liver Function Tests: Recent Labs  Lab 11/07/19 1048 11/07/19 1955  AST 1,062* 498*  ALT 545* 323*  ALKPHOS 981* 769*  BILITOT 1.4* 1.8*  PROT 6.3* 4.9*  ALBUMIN 2.7* 2.0*   Recent Labs  Lab 11/07/19 1048  LIPASE 49   No results for input(s): AMMONIA in the last 168 hours. Coagulation Profile: Recent Labs  Lab 11/07/19 1235  INR 1.3*   Cardiac Enzymes: No results for input(s): CKTOTAL, CKMB, CKMBINDEX, TROPONINI in the last 168 hours. BNP (last 3 results) No results for input(s): PROBNP in the last 8760  hours. HbA1C: No results for input(s): HGBA1C in the last 72 hours. CBG: No results for input(s): GLUCAP in the last 168 hours. Lipid Profile: No results for input(s): CHOL, HDL, LDLCALC, TRIG, CHOLHDL, LDLDIRECT in the last 72 hours. Thyroid Function Tests: No results for input(s): TSH, T4TOTAL, FREET4, T3FREE, THYROIDAB in the last 72 hours. Anemia Panel: No results for input(s): VITAMINB12, FOLATE, FERRITIN, TIBC, IRON, RETICCTPCT in the last 72 hours. Sepsis Labs: Recent Labs  Lab 11/07/19 1235 11/07/19 1418 11/07/19 1636 11/08/19 0022  LATICACIDVEN 5.0* 3.2* 2.6* 1.2    Recent Results (from the past 240 hour(s))  Culture, blood (Routine x 2)     Status: None (Preliminary result)   Collection Time: 11/07/19 11:54 AM   Specimen: BLOOD  Result Value Ref Range Status   Specimen Description BLOOD LEFT ANTECUBITAL  Final   Special Requests   Final    BOTTLES DRAWN AEROBIC AND ANAEROBIC Blood Culture adequate volume   Culture  Setup Time   Final    GRAM NEGATIVE RODS AEROBIC BOTTLE ONLY Performed at [facility], [place] [address], [place], [place] [address]    Culture GRAM NEGATIVE RODS  Final   Report Status PENDING  Incomplete  Culture, blood (Routine x 2)     Status: None (Preliminary result)   Collection Time: 11/07/19 12:35 PM   Specimen: BLOOD  Result Value Ref Range Status   Specimen Description BLOOD RIGHT ANTECUBITAL  Final   Special Requests   Final    BOTTLES DRAWN AEROBIC AND ANAEROBIC Blood Culture adequate volume   Culture  Setup Time   Final    GRAM NEGATIVE RODS AEROBIC BOTTLE ONLY Organism ID to follow CRITICAL RESULT CALLED TO, READ BACK BY AND VERIFIED WITH: T. DANG PHARMD, AT 0809 11/08/19 BY Rush Landmark Performed at [facility], [place] [address] [place], [place] [address]    Culture GRAM NEGATIVE RODS  Final   Report Status PENDING  Incomplete  Blood Culture ID Panel (Reflexed)     Status: Abnormal   Collection Time: 11/07/19 12:35 PM   Result Value Ref Range Status   Enterococcus species NOT DETECTED NOT DETECTED Final   Listeria monocytogenes NOT DETECTED NOT DETECTED Final   Staphylococcus species NOT DETECTED NOT DETECTED Final   Staphylococcus aureus (BCID) NOT DETECTED NOT DETECTED Final   Streptococcus species NOT  DETECTED NOT DETECTED Final   Streptococcus agalactiae NOT DETECTED NOT DETECTED Final   Streptococcus pneumoniae NOT DETECTED NOT DETECTED Final   Streptococcus pyogenes NOT DETECTED NOT DETECTED Final   Acinetobacter baumannii NOT DETECTED NOT DETECTED Final   Enterobacteriaceae species NOT DETECTED NOT DETECTED Final   Enterobacter cloacae complex NOT DETECTED NOT DETECTED Final   Escherichia coli NOT DETECTED NOT DETECTED Final   Klebsiella oxytoca NOT DETECTED NOT DETECTED Final   Klebsiella pneumoniae NOT DETECTED NOT DETECTED Final   Proteus species NOT DETECTED NOT DETECTED Final   Serratia marcescens NOT DETECTED NOT DETECTED Final   Carbapenem resistance NOT DETECTED NOT DETECTED Final   Haemophilus influenzae NOT DETECTED NOT DETECTED Final   Neisseria meningitidis NOT DETECTED NOT DETECTED Final   Pseudomonas aeruginosa DETECTED (A) NOT DETECTED Final    Comment: CRITICAL RESULT CALLED TO, READ BACK BY AND VERIFIED WITH: T. DANG PHARMD, AT 0809 11/08/19 BY D. VANHOOK    Candida albicans NOT DETECTED NOT DETECTED Final   Candida glabrata NOT DETECTED NOT DETECTED Final   Candida krusei NOT DETECTED NOT DETECTED Final   Candida parapsilosis NOT DETECTED NOT DETECTED Final   Candida tropicalis NOT DETECTED NOT DETECTED Final    Comment: Performed at [facility] [address] [place], [place] [address]  SARS Coronavirus 2 by RT PCR (hospital order, performed in [facility] hospital lab) Nasopharyngeal Nasopharyngeal Swab     Status: None   Collection Time: 11/07/19 12:45 PM   Specimen: Nasopharyngeal Swab  Result Value Ref Range Status   SARS Coronavirus 2 NEGATIVE NEGATIVE Final     Comment: (NOTE) SARS-CoV-2 target nucleic acids are NOT DETECTED.  The SARS-CoV-2 RNA is generally detectable in upper and lower respiratory specimens during the acute phase of infection. The lowest concentration of SARS-CoV-2 viral copies this assay can detect is 250 copies / mL. A negative result does not preclude SARS-CoV-2 infection and should not be used as the sole basis for treatment or other patient management decisions.  A negative result may occur with improper specimen collection / handling, submission of specimen other than nasopharyngeal swab, presence of viral mutation(s) within the areas targeted by this assay, and inadequate number of viral copies (<250 copies / mL). A negative result must be combined with clinical observations, patient history, and epidemiological information.  Fact Sheet for Patients:   StrictlyIdeas.no  Fact Sheet for Healthcare Providers: BankingDealers.co.za  This test is not yet approved or  cleared by the [place] FDA and has been authorized for detection and/or diagnosis of SARS-CoV-2 by FDA under an Emergency Use Authorization (EUA).  This EUA will remain in effect (meaning this test can be used) for the duration of the COVID-19 declaration under Section 564(b)(1) of the Act, 21 U.S.C. section 360bbb-3(b)(1), unless the authorization is terminated or revoked sooner.  Performed at [facility], [place] [address], [place], [place] [address]          Radiology Studies: CT Abdomen Pelvis Wo Contrast  Result Date: 11/07/2019 CLINICAL DATA:  Exploratory laparotomy 10/06/2019, lysis of adhesions, abdominal pain, chest pain, short of breath, nonhealing laparotomy incision EXAM: CT CHEST, ABDOMEN AND PELVIS WITHOUT CONTRAST TECHNIQUE: Multidetector CT imaging of the chest, abdomen and pelvis was performed following the standard protocol without IV contrast. COMPARISON:  09/28/2019,  10/03/2019 FINDINGS: CT CHEST FINDINGS Cardiovascular: Unenhanced imaging of the heart and great vessels demonstrates no pericardial effusion. Moderate atherosclerosis of the aorta and coronary vessels. Mediastinum/Nodes: Borderline  enlarged mediastinal lymph nodes are seen, largest measuring 12 mm in short axis in the anterior mediastinum. This is nonspecific. Thyroid, trachea, and esophagus are unremarkable. Lungs/Pleura: Moderate right pleural effusion volume estimated 1 L. Dependent atelectasis within the right lung. There is upper lobe predominant emphysema and scarring. No pneumothorax. Musculoskeletal: No acute or destructive bony lesions. Reconstructed images demonstrate no additional findings. CT ABDOMEN PELVIS FINDINGS Hepatobiliary: No focal liver abnormality is seen. No gallstones, gallbladder wall thickening, or biliary dilatation. Pancreas: Unremarkable. No pancreatic ductal dilatation or surrounding inflammatory changes. Spleen: Normal in size without focal abnormality. Adrenals/Urinary Tract: No urinary tract calculi or obstructive uropathy. The adrenals are unremarkable. The bladder is grossly normal. Stomach/Bowel: Evaluation of the bowel is limited by the lack of intravenous and oral contrast. No bowel obstruction or ileus. No bowel wall thickening or inflammatory change. Vascular/Lymphatic: Aortic atherosclerosis. No enlarged abdominal or pelvic lymph nodes. Reproductive: Prostate is unremarkable. Other: There is trace free fluid in the right upper quadrant. No free intraperitoneal gas. Surgical packing is seen within the midline laparotomy incision. Minimal gas and fluid within the incision site without evidence of large collection or abscess. Musculoskeletal: No acute or destructive bony lesions. Reconstructed images demonstrate no additional findings. IMPRESSION: 1. Moderate right pleural effusion, with dependent right lower lobe consolidation likely atelectasis. 2. Trace free fluid in the  right upper quadrant. 3. Minimal gas and fluid within the midline laparotomy incision site without evidence of large collection or abscess. 4. Borderline enlarged mediastinal lymph nodes, nonspecific. 5. Aortic Atherosclerosis (ICD10-I70.0) and Emphysema (ICD10-J43.9). Electronically Signed   By: Randa Ngo M.D.   On: 11/07/2019 16:46   DG Chest 2 View  Result Date: 11/07/2019 CLINICAL DATA:  Vomiting.  RIGHT lower quadrant pain and EXAM: CHEST - 2 VIEW COMPARISON:  02/25/2019 FINDINGS: Normal cardiac silhouette. Chronic RIGHT pleural effusion noted. No focal infiltrate. No pneumothorax. IMPRESSION: Chronic RIGHT effusion.  No clear acute findings Electronically Signed   By: Suzy Bouchard M.D.   On: 11/07/2019 12:41   CT Chest Wo Contrast  Result Date: 11/07/2019 CLINICAL DATA:  Exploratory laparotomy 10/06/2019, lysis of adhesions, abdominal pain, chest pain, short of breath, nonhealing laparotomy incision EXAM: CT CHEST, ABDOMEN AND PELVIS WITHOUT CONTRAST TECHNIQUE: Multidetector CT imaging of the chest, abdomen and pelvis was performed following the standard protocol without IV contrast. COMPARISON:  09/28/2019, 10/03/2019 FINDINGS: CT CHEST FINDINGS Cardiovascular: Unenhanced imaging of the heart and great vessels demonstrates no pericardial effusion. Moderate atherosclerosis of the aorta and coronary vessels. Mediastinum/Nodes: Borderline enlarged mediastinal lymph nodes are seen, largest measuring 12 mm in short axis in the anterior mediastinum. This is nonspecific. Thyroid, trachea, and esophagus are unremarkable. Lungs/Pleura: Moderate right pleural effusion volume estimated 1 L. Dependent atelectasis within the right lung. There is upper lobe predominant emphysema and scarring. No pneumothorax. Musculoskeletal: No acute or destructive bony lesions. Reconstructed images demonstrate no additional findings. CT ABDOMEN PELVIS FINDINGS Hepatobiliary: No focal liver abnormality is seen. No  gallstones, gallbladder wall thickening, or biliary dilatation. Pancreas: Unremarkable. No pancreatic ductal dilatation or surrounding inflammatory changes. Spleen: Normal in size without focal abnormality. Adrenals/Urinary Tract: No urinary tract calculi or obstructive uropathy. The adrenals are unremarkable. The bladder is grossly normal. Stomach/Bowel: Evaluation of the bowel is limited by the lack of intravenous and oral contrast. No bowel obstruction or ileus. No bowel wall thickening or inflammatory change. Vascular/Lymphatic: Aortic atherosclerosis. No enlarged abdominal or pelvic lymph nodes. Reproductive: Prostate is unremarkable. Other: There is trace free fluid in  the right upper quadrant. No free intraperitoneal gas. Surgical packing is seen within the midline laparotomy incision. Minimal gas and fluid within the incision site without evidence of large collection or abscess. Musculoskeletal: No acute or destructive bony lesions. Reconstructed images demonstrate no additional findings. IMPRESSION: 1. Moderate right pleural effusion, with dependent right lower lobe consolidation likely atelectasis. 2. Trace free fluid in the right upper quadrant. 3. Minimal gas and fluid within the midline laparotomy incision site without evidence of large collection or abscess. 4. Borderline enlarged mediastinal lymph nodes, nonspecific. 5. Aortic Atherosclerosis (ICD10-I70.0) and Emphysema (ICD10-J43.9). Electronically Signed   By: Randa Ngo M.D.   On: 11/07/2019 16:46        Scheduled Meds: . allopurinol  100 mg Oral Daily  . enoxaparin (LOVENOX) injection  30 mg Subcutaneous Q24H  . escitalopram  5 mg Oral Daily  . pantoprazole  40 mg Oral QAC breakfast  . potassium chloride SA  20 mEq Oral Daily  . vancomycin variable dose per unstable renal function (pharmacist dosing)   Does not apply See admin instructions   Continuous Infusions: . ceFEPime (MAXIPIME) IV    . metronidazole 500 mg (11/08/19  0509)     LOS: 1 day    Time spent: 35 mins,More than 50% of that time was spent in counseling and/or coordination of care.      Shelly Coss, MD [facility] P7/17/2021, 8:21 AM

## 2019-11-08 NOTE — Progress Notes (Signed)
PHARMACY - PHYSICIAN COMMUNICATION CRITICAL VALUE ALERT - BLOOD CULTURE IDENTIFICATION (BCID)  [name] is an 59 y.o. male who presented to [facility] on 11/07/2019 with a chief complaint of nausea/vomiting with recent abdominal surgery.  Name of physician (or Provider) Contacted: Dr. Tawanna Solo  Current antibiotics:  Cefepime 7/16>> Flagyl 7/16>> Vanc 7/16>>  Changes to prescribed antibiotics recommended:  Patient is on recommended antibiotics - No changes needed  Results for orders placed or performed during the hospital encounter of 11/07/19  Blood Culture ID Panel (Reflexed) (Collected: 11/07/2019 12:35 PM)  Result Value Ref Range   Enterococcus species NOT DETECTED NOT DETECTED   Listeria monocytogenes NOT DETECTED NOT DETECTED   Staphylococcus species NOT DETECTED NOT DETECTED   Staphylococcus aureus (BCID) NOT DETECTED NOT DETECTED   Streptococcus species NOT DETECTED NOT DETECTED   Streptococcus agalactiae NOT DETECTED NOT DETECTED   Streptococcus pneumoniae NOT DETECTED NOT DETECTED   Streptococcus pyogenes NOT DETECTED NOT DETECTED   Acinetobacter baumannii NOT DETECTED NOT DETECTED   Enterobacteriaceae species NOT DETECTED NOT DETECTED   Enterobacter cloacae complex NOT DETECTED NOT DETECTED   Escherichia coli NOT DETECTED NOT DETECTED   Klebsiella oxytoca NOT DETECTED NOT DETECTED   Klebsiella pneumoniae NOT DETECTED NOT DETECTED   Proteus species NOT DETECTED NOT DETECTED   Serratia marcescens NOT DETECTED NOT DETECTED   Carbapenem resistance NOT DETECTED NOT DETECTED   Haemophilus influenzae NOT DETECTED NOT DETECTED   Neisseria meningitidis NOT DETECTED NOT DETECTED   Pseudomonas aeruginosa DETECTED (A) NOT DETECTED   Candida albicans NOT DETECTED NOT DETECTED   Candida glabrata NOT DETECTED NOT DETECTED   Candida krusei NOT DETECTED NOT DETECTED   Candida parapsilosis NOT DETECTED NOT DETECTED   Candida tropicalis NOT DETECTED NOT DETECTED   Romilda Garret, PharmD PGY1 Acute Care Pharmacy Resident Phone: [phone] 11/08/2019 8:31 AM  Please check [email or web address] for unit specific pharmacy phone numbers.

## 2019-11-08 NOTE — ED Notes (Signed)
Pt packaged and placed on a transport monitor. Pt ready for transport upstairs.

## 2019-11-08 NOTE — Evaluation (Signed)
Physical Therapy Evaluation Patient Details Name: [name] MRN: [id number] DOB: 1960/10/11 Today's Date: 11/08/2019   History of Present Illness  Pt is a 59 yo male presenting with nausea and vomiting, but found to have dehiscence of abdominal wound after surgery 6/14. Upon workup, pt found to have R pleural effusion and likely septic. PMH includes: CAD s/p RCA stent, HF, HTN, asthma, AVM with GI bleed, CKD III, and SBO.  Clinical Impression  Pt in bed upon arrival of PT, agreeable to evaluation at this time. Prior to admission the pt was completely independent without use of AD at home, with some assist from wife for LE dressing and supervision for bathing. The pt is from home where he lives with his family in a home with 3-4 steps to enter. The pt now presents with limitations in functional mobility, strength, and endurance due to above dx, and will continue to benefit from skilled PT to address these deficits and maintain strength during his hospitalization. The pt was able to demo good independence with bed mobility and sit-stand transfers, but declined further mobility due to reports of general fatigue. He reports no balance or mobility concerns at home, but will continue to benefit from skilled PT to maintain strength and mobility during his hospitalization.      Follow Up Recommendations No PT follow up;Supervision for mobility/OOB    Equipment Recommendations  None recommended by PT    Recommendations for Other Services       Precautions / Restrictions Precautions Precautions: Fall Restrictions Weight Bearing Restrictions: No      Mobility  Bed Mobility Overal bed mobility: Modified Independent                Transfers Overall transfer level: Needs assistance Equipment used: None Transfers: Sit to/from Stand Sit to Stand: Supervision         General transfer comment: pt able to stand from EOB without assist or AD, no evident instability, but pt  reluctant to complete more activity at this time stating general fatigue. all VSS  Ambulation/Gait Ambulation/Gait assistance: Supervision Gait Distance (Feet): 2 Feet Assistive device: None Gait Pattern/deviations: Step-to pattern     General Gait Details: pt took 2 small lateral steps to [facility] without AD or assist but declined all further mobility at this time      Balance Overall balance assessment: Mild deficits observed, not formally tested                                           Pertinent Vitals/Pain Pain Assessment: No/denies pain    Home Living Family/patient expects to be discharged to:: Private residence Living Arrangements: Spouse/significant other;Children Available Help at Discharge: Family;Available 24 hours/day Type of Home: House Home Access: Stairs to enter Entrance Stairs-Rails: Right Entrance Stairs-Number of Steps: 3-4 Home Layout: One level Home Equipment: Bedside commode;Crutches;Other (comment) (walking sticks) Additional Comments: wife is disabled    Prior Function Level of Independence: Needs assistance   Gait / Transfers Assistance Needed: pt ambulates independently  ADL's / Homemaking Assistance Needed: wife assist with donning socks/shoes and bathing  Comments: Does not work.     Hand Dominance   Dominant Hand: Right    Extremity/Trunk Assessment   Upper Extremity Assessment Upper Extremity Assessment: Overall WFL for tasks assessed    Lower Extremity Assessment Lower Extremity Assessment: Overall WFL for tasks assessed  Cervical / Trunk Assessment Cervical / Trunk Assessment: Normal  Communication   Communication: No difficulties  Cognition Arousal/Alertness: Awake/alert Behavior During Therapy: WFL for tasks assessed/performed;Agitated Overall Cognitive Status: Within Functional Limits for tasks assessed                                 General Comments: pt expressed some agitation with  repetition of prior level of function and home set up questions. Pt eventually agreeable, but cog will need further assessment             Assessment/Plan    PT Assessment Patient needs continued PT services  PT Problem List Decreased strength;Decreased mobility;Decreased activity tolerance;Decreased balance       PT Treatment Interventions Therapeutic exercise;Gait training;Balance training;Stair training;Functional mobility training;Therapeutic activities;Patient/family education    PT Goals (Current goals can be found in the Care Plan section)  Acute Rehab PT Goals Patient Stated Goal: return home as strong as he was before PT Goal Formulation: With patient/family Time For Goal Achievement: 11/22/19 Potential to Achieve Goals: Good    Frequency Min 3X/week    AM-PAC PT "6 Clicks" Mobility  Outcome Measure Help needed turning from your back to your side while in a flat bed without using bedrails?: None Help needed moving from lying on your back to sitting on the side of a flat bed without using bedrails?: None Help needed moving to and from a bed to a chair (including a wheelchair)?: A Little Help needed standing up from a chair using your arms (e.g., wheelchair or bedside chair)?: None Help needed to walk in hospital room?: A Little Help needed climbing 3-5 steps with a railing? : A Little 6 Click Score: 21    End of Session Equipment Utilized During Treatment: Gait belt Activity Tolerance: Patient tolerated treatment well (pt reports general fatigue today, not wanting to progress mobility) Patient left: in bed;with family/visitor present Nurse Communication: Mobility status PT Visit Diagnosis: Difficulty in walking, not elsewhere classified (R26.2)    Time: 4144-3601 PT Time Calculation (min) (ACUTE ONLY): 11 min   Charges:   PT Evaluation $PT Eval Low Complexity: 1 Low          Karma Ganja, PT, DPT   Acute Rehabilitation Department Pager #: [phone]  Otho Bellows 11/08/2019, 4:05 PM

## 2019-11-08 NOTE — Procedures (Signed)
US guided Rt thoracentesis  850 cc yellow fluid Tolerated well No labs per MD  CXR pending

## 2019-11-09 LAB — CBC WITH DIFFERENTIAL/PLATELET
Abs Immature Granulocytes: 0 10*3/uL (ref 0.00–0.07)
Basophils Absolute: 0.1 10*3/uL (ref 0.0–0.1)
Basophils Relative: 1 %
Eosinophils Absolute: 0.2 10*3/uL (ref 0.0–0.5)
Eosinophils Relative: 2 %
HCT: 26.4 % — ABNORMAL LOW (ref 39.0–52.0)
Hemoglobin: 8.2 g/dL — ABNORMAL LOW (ref 13.0–17.0)
Lymphocytes Relative: 2 %
Lymphs Abs: 0.2 10*3/uL — ABNORMAL LOW (ref 0.7–4.0)
MCH: 30.1 pg (ref 26.0–34.0)
MCHC: 31.1 g/dL (ref 30.0–36.0)
MCV: 97.1 fL (ref 80.0–100.0)
Monocytes Absolute: 0.3 10*3/uL (ref 0.1–1.0)
Monocytes Relative: 4 %
Neutro Abs: 7.1 10*3/uL (ref 1.7–7.7)
Neutrophils Relative %: 91 %
Platelets: 150 10*3/uL (ref 150–400)
RBC: 2.72 MIL/uL — ABNORMAL LOW (ref 4.22–5.81)
RDW: 16.4 % — ABNORMAL HIGH (ref 11.5–15.5)
WBC: 7.8 10*3/uL (ref 4.0–10.5)
nRBC: 0 % (ref 0.0–0.2)
nRBC: 0 /100 WBC

## 2019-11-09 LAB — BASIC METABOLIC PANEL
Anion gap: 9 (ref 5–15)
BUN: 41 mg/dL — ABNORMAL HIGH (ref 8–23)
CO2: 19 mmol/L — ABNORMAL LOW (ref 22–32)
Calcium: 7 mg/dL — ABNORMAL LOW (ref 8.9–10.3)
Chloride: 112 mmol/L — ABNORMAL HIGH (ref 98–111)
Creatinine, Ser: 1.96 mg/dL — ABNORMAL HIGH (ref 0.61–1.24)
GFR calc Af Amer: 42 mL/min — ABNORMAL LOW (ref >60)
GFR calc non Af Amer: 36 mL/min — ABNORMAL LOW (ref >60)
Glucose, Bld: 73 mg/dL (ref 70–99)
Potassium: 3.7 mmol/L (ref 3.5–5.1)
Sodium: 140 mmol/L (ref 135–145)

## 2019-11-09 LAB — HEPATIC FUNCTION PANEL
ALT: 146 U/L — ABNORMAL HIGH (ref 0–44)
AST: 93 U/L — ABNORMAL HIGH (ref 15–41)
Albumin: 1.8 g/dL — ABNORMAL LOW (ref 3.5–5.0)
Alkaline Phosphatase: 475 U/L — ABNORMAL HIGH (ref 38–126)
Bilirubin, Direct: 0.2 mg/dL (ref 0.0–0.2)
Indirect Bilirubin: 0.6 mg/dL (ref 0.3–0.9)
Total Bilirubin: 0.8 mg/dL (ref 0.3–1.2)
Total Protein: 4.8 g/dL — ABNORMAL LOW (ref 6.5–8.1)

## 2019-11-09 NOTE — Plan of Care (Signed)
Poc progressive.

## 2019-11-09 NOTE — Consult Note (Signed)
[facility] Nurse Consult Note: Reason for Consult: Patient's wound at midline was a concern, [facility] surgeon Dr. Rockne Coons saw in consultation today.  No concern for infection, patient's wife dresses at home twice daily. Slough in wound bed is stable and coming out with dressing changes. Wound type: Surgical left open to heal by secondary intention Pressure Injury POA: N/A Measurement: 3.5cm x 2.5cm x 1.4cm Wound bed: 80% obscured by the presence of yellow slough, 20% red, moist Drainage (amount, consistency, odor) small to moderate amount light yellow exudate. Periwound: intact with evidence of previous wound healing, scarring and contraction Dressing procedure/placement/frequency: We will continue to recommend twice daily NS gauze dressings for wound care. Paitent has been instructed to continue to seek nutrition from protein sources.  [place] nursing team will not follow, but will remain available to this patient, the nursing and medical teams.  Please re-consult if needed. Thanks, Maudie Flakes, MSN, RN, [place], Arther Abbott  Pager# [phone]

## 2019-11-09 NOTE — Progress Notes (Signed)
PROGRESS NOTE    [name]  DJM:[id number] DOB: August 09, 1960 DOA: 11/07/2019 PCP: [name], MD   Brief Narrative:  Patient is a 59 year old male with history of coronary artery disease status post RCA stent, chronic systolic heart failure, pericarditis effusion status post pericardial window 2010, hypertension, asthma, AVM with history of GI bleed, CKD stage IIIa, hyperlipidemia, recent abdominal surgery who presents with nausea and vomiting.  He denies any diarrhea or abdominal pain.  Recently had exploratory laparotomy with lysis of adhesions by Dr. Donne Hazel on 10/06/2019 following SBO.  No history of alcohol abuse.  On presentation he was febrile, tachycardic, hypotensive.  Leukocytosis of 19.3.  He had elevated lactic acid at 5 which improved with IV fluids.  Found to have AKI, elevated AST and ALT.  CT chest/abdomen/pelvis showed moderate right pleural effusion with around 1 L of  fluid, no focal liver abnormalities or gallbladder pathology.  No gas or fluid collection.  Hepatitis panel is negative.  Patient has been started on broad-spectrum antibiotics.  Blood culture showing Pseudomonas aeruginosa, sensitivity pending.  Assessment & Plan:   Principal Problem:   Sepsis ([place]) Active Problems:   Hyperlipidemia   Acute renal failure superimposed on chronic kidney disease (HCC)   Anemia   Pleural effusion   Sepsis associated hypotension (HCC)   Suspected sepsis secondary to intra-abdominal source: Recently had exploratory laparotomy with lysis of adhesions for SBO.  Presented with hypotension, elevated lactic acid level.  Blood pressure responded to IV fluids.  Lactic acid has improved.  Continue continue antibiotics.  Follow-up cultures.  Abdominal imaging did not show any acute intra-abdominal findings.  Gram-negative bacteremia: Blood cultures had showed Pseudomonas aeruginosa.  We will continue cefepime for now.  Follow-up cultures.  Source could be abdominal but CT  imaging does not show any abdominal findings.He had leucocytosis which has resolved  Elevated liver enzymes: On reviewing his previous records, he had elevated liver enzymes in 10/2018 which later resolved .  He was seeing Dr. Bryan Lemma as an outpatient.  Extensive work-up was done which were mostly unrevealing aside from elevated ANA.  Patient has history of scleroderma.  Acute elevation cud be associated  with shock liver from hypotension/sepsis.  Liver function is improving.  Hepatitis panel is negative.  Moderate right pleural effusion: Ultrasound-guided thoracentesis done, pleural fluid found to be transudative,no organisms in gram stain.Will follow up fluid analysis-cytology/culture.  It could be associated with diastolic congestive heart failure  AKI on CKD stage IIIa: Prerenal due to hypovolemia.  Continue to monitor BMP.  Continue gentle IV fluids.  His baseline creatinine ranges from 1.8-2.5.  During his last hospitalization his creatinine peaked up to 7.5.  He follows with [facility].  Diastolic congestive heart failure: Currently he is euvolemic or mildly dehydrated.  His echo on 1/21 showed ejection fraction of 22%, grade 2 diastolic dysfunction  Midline abdominal wound, recent exploratory laparotomy with lysis of adhesions: No clear signs of infection.  Wound care consulted.  General surgery doesnot think there is any acute concerns regarding his postsurgical status.  Hyperlipidemia: Statin on hold due to elevated liver enzymes  Chronic anemia: Stable  History of coronary artery  disease: Status post stent.  On statin at home which is on hold.  Severe malnutrition: BMI of 17.6.  Significant muscle and subcutaneous fat loss.  Dietitian consultation  History of pulmonary hypertension: On Opsumit and revatio at home  Normocytic anemia: Most likely associated with chronic medical conditions including CKD.  Continue to  monitor.          DVT prophylaxis:Lovenox Code  Status: Full Family Communication: None present at bedside Status is: Inpatient  Remains inpatient appropriate because:Hemodynamically unstable   Dispo: The patient is from: Home              Anticipated d/c is to: Home              Anticipated d/c date is:1 day              Patient currently is not medically stable to d/c. Needs IV antibiotics.  Pending cultures from blood.   Consultants: General surgery  Procedures:None  Antimicrobials:  Anti-infectives (From admission, onward)   Start     Dose/Rate Route Frequency Ordered Stop   11/08/19 1200  ceFEPIme (MAXIPIME) 2 g in sodium chloride 0.9 % 100 mL IVPB     Discontinue     2 g 200 mL/hr over 30 Minutes Intravenous Every 24 hours 11/07/19 1203     11/07/19 2145  vancomycin (VANCOREADY) IVPB 1250 mg/250 mL        1,250 mg 166.7 mL/hr over 90 Minutes Intravenous  Once 11/07/19 2132 11/08/19 0029   11/07/19 2132  vancomycin variable dose per unstable renal function (pharmacist dosing)  Status:  Discontinued         Does not apply See admin instructions 11/07/19 2132 11/08/19 1306   11/07/19 2100  metroNIDAZOLE (FLAGYL) IVPB 500 mg  Status:  Discontinued        500 mg 100 mL/hr over 60 Minutes Intravenous Every 8 hours 11/07/19 2058 11/08/19 1308   11/07/19 1200  ceFEPIme (MAXIPIME) 2 g in sodium chloride 0.9 % 100 mL IVPB        2 g 200 mL/hr over 30 Minutes Intravenous  Once 11/07/19 1148 11/07/19 1534   11/07/19 1200  metroNIDAZOLE (FLAGYL) IVPB 500 mg        500 mg 100 mL/hr over 60 Minutes Intravenous  Once 11/07/19 1148 11/07/19 1402      Subjective: Patient seen and examined at the bedside this morning.  Hemodynamically stable.  Comfortable, no abdominal pain, nausea or vomiting.  Denies any shortness of breath.  Objective: Vitals:   11/08/19 1944 11/09/19 0019 11/09/19 0458 11/09/19 0733  BP: 95/62 97/62 (!) 100/58 98/60  Pulse: 79 74 64 67  Resp: _0 Temp: 99.1 F (37.3 C) 98.8 F (37.1 C) 98.9 F  (37.2 C) 98.3 F (36.8 C)  TempSrc: Oral Oral Oral Oral  SpO2: 100% 100% 100% 100%  Weight:      Height:        Intake/Output Summary (Last 24 hours) at 11/09/2019 0827 Last data filed at 11/09/2019 0300 Gross per 24 hour  Intake 1482.5 ml  Output 900 ml  Net 582.5 ml   Filed Weights   11/07/19 2100 11/08/19 0200  Weight: 56.3 kg 49.7 kg    Examination:  General exam: Deconditioned, debilitated, malnourished HEENT:PERRL,Oral mucosa moist, Ear/Nose normal on gross exam Respiratory system: Bilateral equal air entry, normal vesicular breath sounds, no wheezes or crackles  Cardiovascular system: S1 & S2 heard, RRR. No JVD, murmurs, rubs, gallops or clicks. Gastrointestinal system: Abdomen is nondistended, soft and nontender. No organomegaly or masses felt. Normal bowel sounds heard.  Clean surgical wound covered with dressings Central nervous system: Alert and oriented.  Extremities: No edema, no clubbing ,no cyanosis Skin: No rashes, lesions or ulcers,no icterus ,no pallor   Data Reviewed: I have  personally reviewed following labs and imaging studies  CBC: Recent Labs  Lab 11/07/19 1048 11/08/19 1048 11/09/19 0737  WBC 19.3* 14.8* 7.8  NEUTROABS  --   --  PENDING  HGB 9.0* 8.6* 8.2*  HCT 29.2* 27.9* 26.4*  MCV 95.1 96.9 97.1  PLT 171 166 488   Basic Metabolic Panel: Recent Labs  Lab 11/07/19 1048 11/08/19 1048  NA 142 141  K 3.9 4.0  CL 111 110  CO2 19* 18*  GLUCOSE 112* 67*  BUN 50* 48*  CREATININE 2.95* 2.44*  CALCIUM 8.2* 7.2*   GFR: Estimated Creatinine Clearance: 22.3 mL/min (A) (by C-G formula based on SCr of 2.44 mg/dL (H)). Liver Function Tests: Recent Labs  Lab 11/07/19 1048 11/07/19 1955 11/08/19 1048  AST 1,062* 498* 252*  ALT 545* 323* 238*  ALKPHOS 981* 769* 679*  BILITOT 1.4* 1.8* 1.1  PROT 6.3* 4.9* 5.0*  ALBUMIN 2.7* 2.0* 2.1*   Recent Labs  Lab 11/07/19 1048  LIPASE 49   No results for input(s): AMMONIA in the last 168  hours. Coagulation Profile: Recent Labs  Lab 11/07/19 1235  INR 1.3*   Cardiac Enzymes: No results for input(s): CKTOTAL, CKMB, CKMBINDEX, TROPONINI in the last 168 hours. BNP (last 3 results) No results for input(s): PROBNP in the last 8760 hours. HbA1C: No results for input(s): HGBA1C in the last 72 hours. CBG: No results for input(s): GLUCAP in the last 168 hours. Lipid Profile: No results for input(s): CHOL, HDL, LDLCALC, TRIG, CHOLHDL, LDLDIRECT in the last 72 hours. Thyroid Function Tests: No results for input(s): TSH, T4TOTAL, FREET4, T3FREE, THYROIDAB in the last 72 hours. Anemia Panel: No results for input(s): VITAMINB12, FOLATE, FERRITIN, TIBC, IRON, RETICCTPCT in the last 72 hours. Sepsis Labs: Recent Labs  Lab 11/07/19 1235 11/07/19 1418 11/07/19 1636 11/08/19 0022  LATICACIDVEN 5.0* 3.2* 2.6* 1.2    Recent Results (from the past 240 hour(s))  Culture, blood (Routine x 2)     Status: None (Preliminary result)   Collection Time: 11/07/19 11:54 AM   Specimen: BLOOD  Result Value Ref Range Status   Specimen Description BLOOD LEFT ANTECUBITAL  Final   Special Requests   Final    BOTTLES DRAWN AEROBIC AND ANAEROBIC Blood Culture adequate volume   Culture  Setup Time   Final    GRAM NEGATIVE RODS AEROBIC BOTTLE ONLY Performed at [facility], [place] [address], [place], [place] [address]    Culture GRAM NEGATIVE RODS  Final   Report Status PENDING  Incomplete  Culture, blood (Routine x 2)     Status: None (Preliminary result)   Collection Time: 11/07/19 12:35 PM   Specimen: BLOOD  Result Value Ref Range Status   Specimen Description BLOOD RIGHT ANTECUBITAL  Final   Special Requests   Final    BOTTLES DRAWN AEROBIC AND ANAEROBIC Blood Culture adequate volume   Culture  Setup Time   Final    GRAM NEGATIVE RODS AEROBIC BOTTLE ONLY Organism ID to follow CRITICAL RESULT CALLED TO, READ BACK BY AND VERIFIED WITH: T. DANG PHARMD, AT 0809 11/08/19 BY [place] Performed at [facility], [place] [address], [place], [place] [address]    Culture GRAM NEGATIVE RODS  Final   Report Status PENDING  Incomplete  Blood Culture ID Panel (Reflexed)     Status: Abnormal   Collection Time: 11/07/19 12:35 PM  Result Value Ref Range Status   Enterococcus species NOT DETECTED NOT DETECTED Final  Listeria monocytogenes NOT DETECTED NOT DETECTED Final   Staphylococcus species NOT DETECTED NOT DETECTED Final   Staphylococcus aureus (BCID) NOT DETECTED NOT DETECTED Final   Streptococcus species NOT DETECTED NOT DETECTED Final   Streptococcus agalactiae NOT DETECTED NOT DETECTED Final   Streptococcus pneumoniae NOT DETECTED NOT DETECTED Final   Streptococcus pyogenes NOT DETECTED NOT DETECTED Final   Acinetobacter baumannii NOT DETECTED NOT DETECTED Final   Enterobacteriaceae species NOT DETECTED NOT DETECTED Final   Enterobacter cloacae complex NOT DETECTED NOT DETECTED Final   Escherichia coli NOT DETECTED NOT DETECTED Final   Klebsiella oxytoca NOT DETECTED NOT DETECTED Final   Klebsiella pneumoniae NOT DETECTED NOT DETECTED Final   Proteus species NOT DETECTED NOT DETECTED Final   Serratia marcescens NOT DETECTED NOT DETECTED Final   Carbapenem resistance NOT DETECTED NOT DETECTED Final   Haemophilus influenzae NOT DETECTED NOT DETECTED Final   Neisseria meningitidis NOT DETECTED NOT DETECTED Final   Pseudomonas aeruginosa DETECTED (A) NOT DETECTED Final    Comment: CRITICAL RESULT CALLED TO, READ BACK BY AND VERIFIED WITH: T. DANG PHARMD, AT 0809 11/08/19 BY D. VANHOOK    Candida albicans NOT DETECTED NOT DETECTED Final   Candida glabrata NOT DETECTED NOT DETECTED Final   Candida krusei NOT DETECTED NOT DETECTED Final   Candida parapsilosis NOT DETECTED NOT DETECTED Final   Candida tropicalis NOT DETECTED NOT DETECTED Final    Comment: Performed at [facility] [address], [place], [place] [address]  SARS Coronavirus 2 by RT PCR  (hospital order, performed in [facility] hospital lab) Nasopharyngeal Nasopharyngeal Swab     Status: None   Collection Time: 11/07/19 12:45 PM   Specimen: Nasopharyngeal Swab  Result Value Ref Range Status   SARS Coronavirus 2 NEGATIVE NEGATIVE Final    Comment: (NOTE) SARS-CoV-2 target nucleic acids are NOT DETECTED.  The SARS-CoV-2 RNA is generally detectable in upper and lower respiratory specimens during the acute phase of infection. The lowest concentration of SARS-CoV-2 viral copies this assay can detect is 250 copies / mL. A negative result does not preclude SARS-CoV-2 infection and should not be used as the sole basis for treatment or other patient management decisions.  A negative result may occur with improper specimen collection / handling, submission of specimen other than nasopharyngeal swab, presence of viral mutation(s) within the areas targeted by this assay, and inadequate number of viral copies (<250 copies / mL). A negative result must be combined with clinical observations, patient history, and epidemiological information.  Fact Sheet for Patients:   StrictlyIdeas.no  Fact Sheet for Healthcare Providers: BankingDealers.co.za  This test is not yet approved or  cleared by the [place] FDA and has been authorized for detection and/or diagnosis of SARS-CoV-2 by FDA under an Emergency Use Authorization (EUA).  This EUA will remain in effect (meaning this test can be used) for the duration of the COVID-19 declaration under Section 564(b)(1) of the Act, 21 U.S.C. section 360bbb-3(b)(1), unless the authorization is terminated or revoked sooner.  Performed at [facility], [place] [address], [place], [place] [address]   Gram stain     Status: None   Collection Time: 11/08/19 10:15 AM   Specimen: PATH Cytology Pleural fluid  Result Value Ref Range Status   Specimen Description PLEURAL FLUID  Final   Special  Requests NONE  Final   Gram Stain   Final    WBC PRESENT, PREDOMINANTLY PMN NO ORGANISMS SEEN CYTOSPIN SMEAR Performed at  [facility], [place] [address], [place], [place] [address]    Report Status 11/08/2019 FINAL  Final         Radiology Studies: CT Abdomen Pelvis Wo Contrast  Result Date: 11/07/2019 CLINICAL DATA:  Exploratory laparotomy 10/06/2019, lysis of adhesions, abdominal pain, chest pain, short of breath, nonhealing laparotomy incision EXAM: CT CHEST, ABDOMEN AND PELVIS WITHOUT CONTRAST TECHNIQUE: Multidetector CT imaging of the chest, abdomen and pelvis was performed following the standard protocol without IV contrast. COMPARISON:  09/28/2019, 10/03/2019 FINDINGS: CT CHEST FINDINGS Cardiovascular: Unenhanced imaging of the heart and great vessels demonstrates no pericardial effusion. Moderate atherosclerosis of the aorta and coronary vessels. Mediastinum/Nodes: Borderline enlarged mediastinal lymph nodes are seen, largest measuring 12 mm in short axis in the anterior mediastinum. This is nonspecific. Thyroid, trachea, and esophagus are unremarkable. Lungs/Pleura: Moderate right pleural effusion volume estimated 1 L. Dependent atelectasis within the right lung. There is upper lobe predominant emphysema and scarring. No pneumothorax. Musculoskeletal: No acute or destructive bony lesions. Reconstructed images demonstrate no additional findings. CT ABDOMEN PELVIS FINDINGS Hepatobiliary: No focal liver abnormality is seen. No gallstones, gallbladder wall thickening, or biliary dilatation. Pancreas: Unremarkable. No pancreatic ductal dilatation or surrounding inflammatory changes. Spleen: Normal in size without focal abnormality. Adrenals/Urinary Tract: No urinary tract calculi or obstructive uropathy. The adrenals are unremarkable. The bladder is grossly normal. Stomach/Bowel: Evaluation of the bowel is limited by the lack of intravenous and oral contrast. No bowel obstruction or ileus.  No bowel wall thickening or inflammatory change. Vascular/Lymphatic: Aortic atherosclerosis. No enlarged abdominal or pelvic lymph nodes. Reproductive: Prostate is unremarkable. Other: There is trace free fluid in the right upper quadrant. No free intraperitoneal gas. Surgical packing is seen within the midline laparotomy incision. Minimal gas and fluid within the incision site without evidence of large collection or abscess. Musculoskeletal: No acute or destructive bony lesions. Reconstructed images demonstrate no additional findings. IMPRESSION: 1. Moderate right pleural effusion, with dependent right lower lobe consolidation likely atelectasis. 2. Trace free fluid in the right upper quadrant. 3. Minimal gas and fluid within the midline laparotomy incision site without evidence of large collection or abscess. 4. Borderline enlarged mediastinal lymph nodes, nonspecific. 5. Aortic Atherosclerosis (ICD10-I70.0) and Emphysema (ICD10-J43.9). Electronically Signed   By: Randa Ngo M.D.   On: 11/07/2019 16:46   DG Chest 1 View  Result Date: 11/08/2019 CLINICAL DATA:  59 year old male with a history thoracentesis EXAM: CHEST  1 VIEW COMPARISON:  11/07/2019 FINDINGS: Cardiomediastinal silhouette unchanged in size and contour. Similar appearance of reticular opacities of the bilateral lungs with no new confluent airspace disease. No pneumothorax. Blunting of the right costophrenic angle. No left-sided pleural fluid. IMPRESSION: No pneumothorax status post thoracentesis, with trace pleural fluid persisting. Similar appearance of the lungs with coarsened interstitial markings. Electronically Signed   By: Corrie Mckusick D.O.   On: 11/08/2019 09:55   DG Chest 2 View  Result Date: 11/07/2019 CLINICAL DATA:  Vomiting.  RIGHT lower quadrant pain and EXAM: CHEST - 2 VIEW COMPARISON:  02/25/2019 FINDINGS: Normal cardiac silhouette. Chronic RIGHT pleural effusion noted. No focal infiltrate. No pneumothorax. IMPRESSION:  Chronic RIGHT effusion.  No clear acute findings Electronically Signed   By: Suzy Bouchard M.D.   On: 11/07/2019 12:41   CT Chest Wo Contrast  Result Date: 11/07/2019 CLINICAL DATA:  Exploratory laparotomy 10/06/2019, lysis of adhesions, abdominal pain, chest pain, short of breath, nonhealing laparotomy incision EXAM: CT CHEST, ABDOMEN AND PELVIS WITHOUT CONTRAST TECHNIQUE: Multidetector CT imaging  of the chest, abdomen and pelvis was performed following the standard protocol without IV contrast. COMPARISON:  09/28/2019, 10/03/2019 FINDINGS: CT CHEST FINDINGS Cardiovascular: Unenhanced imaging of the heart and great vessels demonstrates no pericardial effusion. Moderate atherosclerosis of the aorta and coronary vessels. Mediastinum/Nodes: Borderline enlarged mediastinal lymph nodes are seen, largest measuring 12 mm in short axis in the anterior mediastinum. This is nonspecific. Thyroid, trachea, and esophagus are unremarkable. Lungs/Pleura: Moderate right pleural effusion volume estimated 1 L. Dependent atelectasis within the right lung. There is upper lobe predominant emphysema and scarring. No pneumothorax. Musculoskeletal: No acute or destructive bony lesions. Reconstructed images demonstrate no additional findings. CT ABDOMEN PELVIS FINDINGS Hepatobiliary: No focal liver abnormality is seen. No gallstones, gallbladder wall thickening, or biliary dilatation. Pancreas: Unremarkable. No pancreatic ductal dilatation or surrounding inflammatory changes. Spleen: Normal in size without focal abnormality. Adrenals/Urinary Tract: No urinary tract calculi or obstructive uropathy. The adrenals are unremarkable. The bladder is grossly normal. Stomach/Bowel: Evaluation of the bowel is limited by the lack of intravenous and oral contrast. No bowel obstruction or ileus. No bowel wall thickening or inflammatory change. Vascular/Lymphatic: Aortic atherosclerosis. No enlarged abdominal or pelvic lymph nodes. Reproductive:  Prostate is unremarkable. Other: There is trace free fluid in the right upper quadrant. No free intraperitoneal gas. Surgical packing is seen within the midline laparotomy incision. Minimal gas and fluid within the incision site without evidence of large collection or abscess. Musculoskeletal: No acute or destructive bony lesions. Reconstructed images demonstrate no additional findings. IMPRESSION: 1. Moderate right pleural effusion, with dependent right lower lobe consolidation likely atelectasis. 2. Trace free fluid in the right upper quadrant. 3. Minimal gas and fluid within the midline laparotomy incision site without evidence of large collection or abscess. 4. Borderline enlarged mediastinal lymph nodes, nonspecific. 5. Aortic Atherosclerosis (ICD10-I70.0) and Emphysema (ICD10-J43.9). Electronically Signed   By: Randa Ngo M.D.   On: 11/07/2019 16:46   US THORACENTESIS ASP PLEURAL SPACE W/IMG GUIDE  Result Date: 11/08/2019 INDICATION: Right pleural effusion EXAM: ULTRASOUND GUIDED Right THORACENTESIS MEDICATIONS: 9 cc 1% lidocaine. COMPLICATIONS: None immediate. PROCEDURE: An ultrasound guided thoracentesis was thoroughly discussed with the patient and questions answered. The benefits, risks, alternatives and complications were also discussed. The patient understands and wishes to proceed with the procedure. Written consent was obtained. Ultrasound was performed to localize and mark an adequate pocket of fluid in the right chest. The area was then prepped and draped in the normal sterile fashion. 1% Lidocaine was used for local anesthesia. Under ultrasound guidance a 19 G Yueh catheter was introduced. Thoracentesis was performed. The catheter was removed and a dressing applied. FINDINGS: A total of approximately 850 cc of clear yellow fluid was removed. IMPRESSION: Successful ultrasound guided right thoracentesis yielding 850 cc of pleural fluid. Read by Lavonia Drafts [facility] Electronically Signed   By:  Corrie Mckusick D.O.   On: 11/08/2019 09:35        Scheduled Meds: . allopurinol  100 mg Oral Daily  . enoxaparin (LOVENOX) injection  30 mg Subcutaneous Q24H  . escitalopram  5 mg Oral Daily  . pantoprazole  40 mg Oral QAC breakfast  . potassium chloride SA  20 mEq Oral Daily   Continuous Infusions: . sodium chloride 75 mL/hr at 11/08/19 1426  . ceFEPime (MAXIPIME) IV 2 g (11/08/19 1111)     LOS: 2 days    Time spent: 35 mins,More than 50% of that time was spent in counseling and/or coordination of care.  Shelly Coss, MD [facility] P7/18/2021, 8:27 AM

## 2019-11-10 LAB — CULTURE, BLOOD (ROUTINE X 2)
Special Requests: ADEQUATE
Special Requests: ADEQUATE

## 2019-11-10 LAB — URINE CULTURE: Culture: 1000 — AB

## 2019-11-10 LAB — COMPREHENSIVE METABOLIC PANEL
ALT: 94 U/L — ABNORMAL HIGH (ref 0–44)
AST: 45 U/L — ABNORMAL HIGH (ref 15–41)
Albumin: 1.7 g/dL — ABNORMAL LOW (ref 3.5–5.0)
Alkaline Phosphatase: 375 U/L — ABNORMAL HIGH (ref 38–126)
Anion gap: 6 (ref 5–15)
BUN: 29 mg/dL — ABNORMAL HIGH (ref 8–23)
CO2: 18 mmol/L — ABNORMAL LOW (ref 22–32)
Calcium: 6.8 mg/dL — ABNORMAL LOW (ref 8.9–10.3)
Chloride: 113 mmol/L — ABNORMAL HIGH (ref 98–111)
Creatinine, Ser: 1.52 mg/dL — ABNORMAL HIGH (ref 0.61–1.24)
GFR calc Af Amer: 56 mL/min — ABNORMAL LOW (ref >60)
GFR calc non Af Amer: 49 mL/min — ABNORMAL LOW (ref >60)
Glucose, Bld: 70 mg/dL (ref 70–99)
Potassium: 3.6 mmol/L (ref 3.5–5.1)
Sodium: 137 mmol/L (ref 135–145)
Total Bilirubin: 0.8 mg/dL (ref 0.3–1.2)
Total Protein: 4.3 g/dL — ABNORMAL LOW (ref 6.5–8.1)

## 2019-11-10 LAB — CYTOLOGY - NON PAP

## 2019-11-10 MED ORDER — SODIUM CHLORIDE 0.9 % IV SOLN
2.0000 g | Freq: Two times a day (BID) | INTRAVENOUS | Status: DC
  Administered 2019-11-10: 2 g via INTRAVENOUS
  Filled 2019-11-10 (×2): qty 2

## 2019-11-10 MED ORDER — CIPROFLOXACIN HCL 500 MG PO TABS
500.0000 mg | ORAL_TABLET | Freq: Two times a day (BID) | ORAL | 0 refills | Status: AC
Start: 2019-11-10 — End: 2019-11-20

## 2019-11-10 MED ORDER — ENOXAPARIN SODIUM 40 MG/0.4ML ~~LOC~~ SOLN: 40.0000 mg | SUBCUTANEOUS | Status: DC

## 2019-11-10 MED ORDER — FOSFOMYCIN TROMETHAMINE 3 G PO PACK
3.0000 g | PACK | Freq: Once | ORAL | Status: AC
  Administered 2019-11-10: 3 g via ORAL
  Filled 2019-11-10: qty 3

## 2019-11-10 NOTE — Discharge Summary (Signed)
Physician Discharge Summary  [name] MWU:[id number] DOB: Oct 11, 1960 DOA: 11/07/2019  PCP: [name], MD  Admit date: 11/07/2019 Discharge date: 11/10/2019  Admitted From: Home Disposition:  Home  Discharge Condition:Stable CODE STATUS:FULL Diet recommendation: Heart Healthy  Brief/Interim Summary: Patient is a 59 year old male with history of coronary artery disease status post RCA stent, chronic systolic heart failure, pericarditis effusion status post pericardial window 2010, hypertension, asthma, AVM with history of GI bleed, CKD stage IIIa, hyperlipidemia, recent abdominal surgery who presents with nausea and vomiting.  He denies any diarrhea or abdominal pain.  Recently had exploratory laparotomy with lysis of adhesions by Dr. Donne Hazel on 10/06/2019 following SBO.  No history of alcohol abuse.  On presentation he was febrile, tachycardic, hypotensive.  Leukocytosis of 19.3.  He had elevated lactic acid at 5 which improved with IV fluids.  Found to have AKI, elevated AST and ALT.  CT chest/abdomen/pelvis showed moderate right pleural effusion with around 1 L of  fluid, no focal liver abnormalities or gallbladder pathology.  No gas or fluid collection.  Hepatitis panel is negative.  Patient was started on broad-spectrum antibiotics.  Blood culture showed Pseudomonas aeruginosa, sensitive to Cipro.  Since he is hemodynamically stable and able to take oral medications, antibiotics has been changed to Cipro which we will continue for total of 2 weeks course.  Hemodynamically stable for discharge to home today.  Following problems were addressed during his hospitalization:  Suspected sepsis secondary to intra-abdominal source: Recently had exploratory laparotomy with lysis of adhesions for SBO.  Presented with hypotension, elevated lactic acid level.  Blood pressure responded to IV fluids.  Lactic acidosis has resolved.    Abdominal imaging did not show any acute intra-abdominal  findings.  Gram-negative bacteremia: Blood cultures had showed Pseudomonas aeruginosa. .  Source could be abdominal but CT imaging does not show any abdominal findings.He had leucocytosis which has resolved.  Antibiotics changed to Cipro.  Elevated liver enzymes: On reviewing his previous records, he had elevated liver enzymes in 10/2018 which later resolved .  He was seeing Dr. Bryan Lemma as an outpatient.  Extensive work-up was done which were mostly unrevealing aside from elevated ANA.  Patient has history of scleroderma.  Acute elevation cud be associated  with shock liver from hypotension/sepsis.  Liver function has significantly improved.  Hepatitis panel is negative.  Moderate right pleural effusion: Ultrasound-guided thoracentesis done, pleural fluid found to be transudative,no organisms in gram stain.  It could be associated with diastolic congestive heart failure  AKI on CKD stage IIIa: Prerenal due to hypovolemia.  Continue to monitor BMP. His baseline creatinine ranges from 1.8-2.5.  During his last hospitalization his creatinine peaked up to 7.5.  He follows with [facility].  Kidney functon improved with IV fluids.  Diastolic congestive heart failure: Currently he is euvolemic or mildly dehydrated.  His echo on 1/21 showed ejection fraction of 72%, grade 2 diastolic dysfunction  Midline abdominal wound, recent exploratory laparotomy with lysis of adhesions: No clear signs of infection.  Wound care consulted.  General surgery doesnot think there is any acute concerns regarding his postsurgical status.  Hyperlipidemia:  He presented with severe elevation in liver enzymes.  He takes simvastatin at home.  We recommend to hold until complete recovery of the liver function.  Chronic anemia: Stable  History of coronary artery  disease: Status post stent.  Currently stable  Severe malnutrition: BMI of 17.6.  Significant muscle and subcutaneous fat loss.  Dietitian  consultation  History of pulmonary hypertension: On Opsumit and revatio at home  Normocytic anemia: Most likely associated with chronic medical conditions including CKD.  Continue to monitor as an outpatient   Discharge Diagnoses:  Principal Problem:   Sepsis ([place]) Active Problems:   Hyperlipidemia   Acute renal failure superimposed on chronic kidney disease (HCC)   Anemia   Pleural effusion   Sepsis associated hypotension (HCC)    Discharge Instructions  Discharge Instructions    Diet - low sodium heart healthy   Complete by: As directed    Discharge instructions   Complete by: As directed    1)Please take prescribed medications as instructed. 2)Follow up with your PCP in a week.  Do a CMP, CBC test during the follow-up.   Discharge wound care:   Complete by: As directed    As per wound nurse   Increase activity slowly   Complete by: As directed      Allergies as of 11/10/2019      Reactions   Oxycodone Other (See Comments)   Hallucinations      Medication List    STOP taking these medications   simvastatin 40 MG tablet Commonly known as: ZOCOR     TAKE these medications   acetaminophen 500 MG tablet Commonly known as: TYLENOL Take 500-1,000 mg by mouth every 6 (six) hours as needed for mild pain or headache.   allopurinol 100 MG tablet Commonly known as: ZYLOPRIM TAKE 1 TABLET BY MOUTH EVERY DAY   amLODipine 5 MG tablet Commonly known as: NORVASC Take 1 tablet (5 mg total) by mouth daily.   ciprofloxacin 500 MG tablet Commonly known as: Cipro Take 1 tablet (500 mg total) by mouth 2 (two) times daily for 10 days.   colchicine 0.6 MG tablet Take 0.6 mg by mouth daily as needed (gout attacks).   escitalopram 5 MG tablet Commonly known as: LEXAPRO Take 5 mg by mouth daily.   feeding supplement (BOOST BREEZE) Liqd Take 237 mLs by mouth in the morning, at noon, and at bedtime.   feeding supplement (ENSURE ENLIVE) Liqd Take 237 mLs by mouth 3  (three) times daily between meals.   fluticasone 50 MCG/ACT nasal spray Commonly known as: FLONASE Place 2 sprays into both nostrils daily as needed (seasonal allergies).   HYDROcodone-acetaminophen 10-325 MG tablet Commonly known as: NORCO Take 0.5-1 tablets by mouth every 6 (six) hours as needed for moderate pain.   Opsumit 10 MG tablet Generic drug: macitentan Take 1 tablet (10 mg total) by mouth daily.   oxyCODONE 5 MG immediate release tablet Commonly known as: Oxy IR/ROXICODONE Take 1 tablet (5 mg total) by mouth every 4 (four) hours as needed.   pantoprazole 40 MG tablet Commonly known as: PROTONIX Take 1 tablet (40 mg total) by mouth daily. What changed: when to take this   potassium chloride SA 20 MEQ tablet Commonly known as: KLOR-CON Take 2 tablets (40 mEq total) by mouth 2 (two) times daily. What changed:   how much to take  when to take this   RETACRIT IJ Inject as directed every 30 (thirty) days.   sildenafil 20 MG tablet Commonly known as: REVATIO Take 2 tablets (40 mg total) by mouth 3 (three) times daily. What changed: when to take this   Suprep Bowel Prep Kit 17.5-3.13-1.6 GM/177ML Soln Generic drug: Na Sulfate-K Sulfate-Mg Sulf Take by mouth as directed.   torsemide 20 MG tablet Commonly known as: DEMADEX Take 1 tablet (20 mg total) by  mouth daily. What changed: when to take this            Discharge Care Instructions  (From admission, onward)         Start     Ordered   11/10/19 0000  Discharge wound care:       Comments: As per wound nurse   11/10/19 1039          Follow-up Information    [name], Ravisankar, MD. Schedule an appointment as soon as possible for a visit in 1 week(s).   Specialty: Internal Medicine Contact information: [place] [address] [phone]              Allergies  Allergen Reactions  . Oxycodone Other (See Comments)    Hallucinations    Consultations:  General  surgery   Procedures/Studies: CT Abdomen Pelvis Wo Contrast  Result Date: 11/07/2019 CLINICAL DATA:  Exploratory laparotomy 10/06/2019, lysis of adhesions, abdominal pain, chest pain, short of breath, nonhealing laparotomy incision EXAM: CT CHEST, ABDOMEN AND PELVIS WITHOUT CONTRAST TECHNIQUE: Multidetector CT imaging of the chest, abdomen and pelvis was performed following the standard protocol without IV contrast. COMPARISON:  09/28/2019, 10/03/2019 FINDINGS: CT CHEST FINDINGS Cardiovascular: Unenhanced imaging of the heart and great vessels demonstrates no pericardial effusion. Moderate atherosclerosis of the aorta and coronary vessels. Mediastinum/Nodes: Borderline enlarged mediastinal lymph nodes are seen, largest measuring 12 mm in short axis in the anterior mediastinum. This is nonspecific. Thyroid, trachea, and esophagus are unremarkable. Lungs/Pleura: Moderate right pleural effusion volume estimated 1 L. Dependent atelectasis within the right lung. There is upper lobe predominant emphysema and scarring. No pneumothorax. Musculoskeletal: No acute or destructive bony lesions. Reconstructed images demonstrate no additional findings. CT ABDOMEN PELVIS FINDINGS Hepatobiliary: No focal liver abnormality is seen. No gallstones, gallbladder wall thickening, or biliary dilatation. Pancreas: Unremarkable. No pancreatic ductal dilatation or surrounding inflammatory changes. Spleen: Normal in size without focal abnormality. Adrenals/Urinary Tract: No urinary tract calculi or obstructive uropathy. The adrenals are unremarkable. The bladder is grossly normal. Stomach/Bowel: Evaluation of the bowel is limited by the lack of intravenous and oral contrast. No bowel obstruction or ileus. No bowel wall thickening or inflammatory change. Vascular/Lymphatic: Aortic atherosclerosis. No enlarged abdominal or pelvic lymph nodes. Reproductive: Prostate is unremarkable. Other: There is trace free fluid in the right upper  quadrant. No free intraperitoneal gas. Surgical packing is seen within the midline laparotomy incision. Minimal gas and fluid within the incision site without evidence of large collection or abscess. Musculoskeletal: No acute or destructive bony lesions. Reconstructed images demonstrate no additional findings. IMPRESSION: 1. Moderate right pleural effusion, with dependent right lower lobe consolidation likely atelectasis. 2. Trace free fluid in the right upper quadrant. 3. Minimal gas and fluid within the midline laparotomy incision site without evidence of large collection or abscess. 4. Borderline enlarged mediastinal lymph nodes, nonspecific. 5. Aortic Atherosclerosis (ICD10-I70.0) and Emphysema (ICD10-J43.9). Electronically Signed   By: Randa Ngo M.D.   On: 11/07/2019 16:46   DG Chest 1 View  Result Date: 11/08/2019 CLINICAL DATA:  59 year old male with a history thoracentesis EXAM: CHEST  1 VIEW COMPARISON:  11/07/2019 FINDINGS: Cardiomediastinal silhouette unchanged in size and contour. Similar appearance of reticular opacities of the bilateral lungs with no new confluent airspace disease. No pneumothorax. Blunting of the right costophrenic angle. No left-sided pleural fluid. IMPRESSION: No pneumothorax status post thoracentesis, with trace pleural fluid persisting. Similar appearance of the lungs with coarsened interstitial markings. Electronically Signed   By: York Cerise  Earleen [place] D.O.   On: 11/08/2019 09:55   DG Chest 2 View  Result Date: 11/07/2019 CLINICAL DATA:  Vomiting.  RIGHT lower quadrant pain and EXAM: CHEST - 2 VIEW COMPARISON:  02/25/2019 FINDINGS: Normal cardiac silhouette. Chronic RIGHT pleural effusion noted. No focal infiltrate. No pneumothorax. IMPRESSION: Chronic RIGHT effusion.  No clear acute findings Electronically Signed   By: Suzy Bouchard M.D.   On: 11/07/2019 12:41   CT Chest Wo Contrast  Result Date: 11/07/2019 CLINICAL DATA:  Exploratory laparotomy 10/06/2019, lysis of  adhesions, abdominal pain, chest pain, short of breath, nonhealing laparotomy incision EXAM: CT CHEST, ABDOMEN AND PELVIS WITHOUT CONTRAST TECHNIQUE: Multidetector CT imaging of the chest, abdomen and pelvis was performed following the standard protocol without IV contrast. COMPARISON:  09/28/2019, 10/03/2019 FINDINGS: CT CHEST FINDINGS Cardiovascular: Unenhanced imaging of the heart and great vessels demonstrates no pericardial effusion. Moderate atherosclerosis of the aorta and coronary vessels. Mediastinum/Nodes: Borderline enlarged mediastinal lymph nodes are seen, largest measuring 12 mm in short axis in the anterior mediastinum. This is nonspecific. Thyroid, trachea, and esophagus are unremarkable. Lungs/Pleura: Moderate right pleural effusion volume estimated 1 L. Dependent atelectasis within the right lung. There is upper lobe predominant emphysema and scarring. No pneumothorax. Musculoskeletal: No acute or destructive bony lesions. Reconstructed images demonstrate no additional findings. CT ABDOMEN PELVIS FINDINGS Hepatobiliary: No focal liver abnormality is seen. No gallstones, gallbladder wall thickening, or biliary dilatation. Pancreas: Unremarkable. No pancreatic ductal dilatation or surrounding inflammatory changes. Spleen: Normal in size without focal abnormality. Adrenals/Urinary Tract: No urinary tract calculi or obstructive uropathy. The adrenals are unremarkable. The bladder is grossly normal. Stomach/Bowel: Evaluation of the bowel is limited by the lack of intravenous and oral contrast. No bowel obstruction or ileus. No bowel wall thickening or inflammatory change. Vascular/Lymphatic: Aortic atherosclerosis. No enlarged abdominal or pelvic lymph nodes. Reproductive: Prostate is unremarkable. Other: There is trace free fluid in the right upper quadrant. No free intraperitoneal gas. Surgical packing is seen within the midline laparotomy incision. Minimal gas and fluid within the incision site  without evidence of large collection or abscess. Musculoskeletal: No acute or destructive bony lesions. Reconstructed images demonstrate no additional findings. IMPRESSION: 1. Moderate right pleural effusion, with dependent right lower lobe consolidation likely atelectasis. 2. Trace free fluid in the right upper quadrant. 3. Minimal gas and fluid within the midline laparotomy incision site without evidence of large collection or abscess. 4. Borderline enlarged mediastinal lymph nodes, nonspecific. 5. Aortic Atherosclerosis (ICD10-I70.0) and Emphysema (ICD10-J43.9). Electronically Signed   By: Randa Ngo M.D.   On: 11/07/2019 16:46   US THORACENTESIS ASP PLEURAL SPACE W/IMG GUIDE  Result Date: 11/08/2019 INDICATION: Right pleural effusion EXAM: ULTRASOUND GUIDED Right THORACENTESIS MEDICATIONS: 9 cc 1% lidocaine. COMPLICATIONS: None immediate. PROCEDURE: An ultrasound guided thoracentesis was thoroughly discussed with the patient and questions answered. The benefits, risks, alternatives and complications were also discussed. The patient understands and wishes to proceed with the procedure. Written consent was obtained. Ultrasound was performed to localize and mark an adequate pocket of fluid in the right chest. The area was then prepped and draped in the normal sterile fashion. 1% Lidocaine was used for local anesthesia. Under ultrasound guidance a 19 G Yueh catheter was introduced. Thoracentesis was performed. The catheter was removed and a dressing applied. FINDINGS: A total of approximately 850 cc of clear yellow fluid was removed. IMPRESSION: Successful ultrasound guided right thoracentesis yielding 850 cc of pleural fluid. Read by Lavonia Drafts [facility] Electronically Signed   By:  Corrie Mckusick D.O.   On: 11/08/2019 09:35      Subjective:  Patient seen and examined at the bedside this morning.  Hemodynamically stable for discharge today.  Discharge Exam: Vitals:   11/10/19 0323 11/10/19 0748   BP: 115/62 124/67  Pulse: 69 64  Resp: 18 18  Temp: 98.6 F (37 C) 98.4 F (36.9 C)  SpO2: 96% 100%   Vitals:   11/09/19 1940 11/09/19 2332 11/10/19 0323 11/10/19 0748  BP: (!) 114/56 115/64 115/62 124/67  Pulse: 72 68 69 64  Resp: _0 Temp: 98.6 F (37 C) 98.4 F (36.9 C) 98.6 F (37 C) 98.4 F (36.9 C)  TempSrc: Oral Oral Oral Oral  SpO2: 100% 98% 96% 100%  Weight:      Height:        General: Pt is alert, awake, not in acute distress Cardiovascular: RRR, S1/S2 +, no rubs, no gallops Respiratory: CTA bilaterally, no wheezing, no rhonchi Abdominal: Soft, NT, ND, bowel sounds +, clean abdominal wound. Extremities: no edema, no cyanosis    The results of significant diagnostics from this hospitalization (including imaging, microbiology, ancillary and laboratory) are listed below for reference.     Microbiology: Recent Results (from the past 240 hour(s))  Culture, blood (Routine x 2)     Status: Abnormal   Collection Time: 11/07/19 11:54 AM   Specimen: BLOOD  Result Value Ref Range Status   Specimen Description BLOOD LEFT ANTECUBITAL  Final   Special Requests   Final    BOTTLES DRAWN AEROBIC AND ANAEROBIC Blood Culture adequate volume   Culture  Setup Time   Final    GRAM NEGATIVE RODS AEROBIC BOTTLE ONLY CRITICAL VALUE NOTED.  VALUE IS CONSISTENT WITH PREVIOUSLY REPORTED AND CALLED VALUE.    Culture (A)  Final    PSEUDOMONAS AERUGINOSA SUSCEPTIBILITIES PERFORMED ON PREVIOUS CULTURE WITHIN THE LAST 5 DAYS. Performed at [facility], [place] [address], [place], [place] [address]    Report Status 11/10/2019 FINAL  Final  Culture, blood (Routine x 2)     Status: Abnormal   Collection Time: 11/07/19 12:35 PM   Specimen: BLOOD  Result Value Ref Range Status   Specimen Description BLOOD RIGHT ANTECUBITAL  Final   Special Requests   Final    BOTTLES DRAWN AEROBIC AND ANAEROBIC Blood Culture adequate volume   Culture  Setup Time   Final    GRAM  NEGATIVE RODS AEROBIC BOTTLE ONLY CRITICAL RESULT CALLED TO, READ BACK BY AND VERIFIED WITH: T. DANG PHARMD, AT 0809 11/08/19 BY Rush Landmark Performed at [facility], [place] [address], [place], [place] [address]    Culture PSEUDOMONAS AERUGINOSA (A)  Final   Report Status 11/10/2019 FINAL  Final   Organism ID, Bacteria PSEUDOMONAS AERUGINOSA  Final      Susceptibility   Pseudomonas aeruginosa - MIC*    CEFTAZIDIME 2 SENSITIVE Sensitive     CIPROFLOXACIN 0.5 SENSITIVE Sensitive     GENTAMICIN <=1 SENSITIVE Sensitive     IMIPENEM 2 SENSITIVE Sensitive     PIP/TAZO 8 SENSITIVE Sensitive     CEFEPIME 2 SENSITIVE Sensitive     * PSEUDOMONAS AERUGINOSA  Blood Culture ID Panel (Reflexed)     Status: Abnormal   Collection Time: 11/07/19 12:35 PM  Result Value Ref Range Status   Enterococcus species NOT DETECTED NOT DETECTED Final   Listeria monocytogenes NOT DETECTED NOT DETECTED Final   Staphylococcus species NOT DETECTED NOT  DETECTED Final   Staphylococcus aureus (BCID) NOT DETECTED NOT DETECTED Final   Streptococcus species NOT DETECTED NOT DETECTED Final   Streptococcus agalactiae NOT DETECTED NOT DETECTED Final   Streptococcus pneumoniae NOT DETECTED NOT DETECTED Final   Streptococcus pyogenes NOT DETECTED NOT DETECTED Final   Acinetobacter baumannii NOT DETECTED NOT DETECTED Final   Enterobacteriaceae species NOT DETECTED NOT DETECTED Final   Enterobacter cloacae complex NOT DETECTED NOT DETECTED Final   Escherichia coli NOT DETECTED NOT DETECTED Final   Klebsiella oxytoca NOT DETECTED NOT DETECTED Final   Klebsiella pneumoniae NOT DETECTED NOT DETECTED Final   Proteus species NOT DETECTED NOT DETECTED Final   Serratia marcescens NOT DETECTED NOT DETECTED Final   Carbapenem resistance NOT DETECTED NOT DETECTED Final   Haemophilus influenzae NOT DETECTED NOT DETECTED Final   Neisseria meningitidis NOT DETECTED NOT DETECTED Final   Pseudomonas aeruginosa DETECTED (A) NOT  DETECTED Final    Comment: CRITICAL RESULT CALLED TO, READ BACK BY AND VERIFIED WITH: T. DANG PHARMD, AT 0809 11/08/19 BY D. VANHOOK    Candida albicans NOT DETECTED NOT DETECTED Final   Candida glabrata NOT DETECTED NOT DETECTED Final   Candida krusei NOT DETECTED NOT DETECTED Final   Candida parapsilosis NOT DETECTED NOT DETECTED Final   Candida tropicalis NOT DETECTED NOT DETECTED Final    Comment: Performed at [place] [address], [place], [place] [address]  SARS Coronavirus 2 by RT PCR (hospital order, performed in [facility] hospital lab) Nasopharyngeal Nasopharyngeal Swab     Status: None   Collection Time: 11/07/19 12:45 PM   Specimen: Nasopharyngeal Swab  Result Value Ref Range Status   SARS Coronavirus 2 NEGATIVE NEGATIVE Final    Comment: (NOTE) SARS-CoV-2 target nucleic acids are NOT DETECTED.  The SARS-CoV-2 RNA is generally detectable in upper and lower respiratory specimens during the acute phase of infection. The lowest concentration of SARS-CoV-2 viral copies this assay can detect is 250 copies / mL. A negative result does not preclude SARS-CoV-2 infection and should not be used as the sole basis for treatment or other patient management decisions.  A negative result may occur with improper specimen collection / handling, submission of specimen other than nasopharyngeal swab, presence of viral mutation(s) within the areas targeted by this assay, and inadequate number of viral copies (<250 copies / mL). A negative result must be combined with clinical observations, patient history, and epidemiological information.  Fact Sheet for Patients:   StrictlyIdeas.no  Fact Sheet for Healthcare Providers: BankingDealers.co.za  This test is not yet approved or  cleared by the [place] FDA and has been authorized for detection and/or diagnosis of SARS-CoV-2 by FDA under an Emergency Use Authorization (EUA).   This EUA will remain in effect (meaning this test can be used) for the duration of the COVID-19 declaration under Section 564(b)(1) of the Act, 21 U.S.C. section 360bbb-3(b)(1), unless the authorization is terminated or revoked sooner.  Performed at [facility], [place] [address], [place], [place] [address]   Urine culture     Status: Abnormal (Preliminary result)   Collection Time: 11/07/19  7:55 PM   Specimen: In/Out Cath Urine  Result Value Ref Range Status   Specimen Description IN/OUT CATH URINE  Final   Special Requests NONE  Final   Culture (A)  Final    1,000 COLONIES/mL PROTEUS MIRABILIS 20,000 COLONIES/mL ENTEROCOCCUS FAECALIS SUSCEPTIBILITIES TO FOLLOW Performed at [facility], [place] [address] [place], [place]  [address]    Report Status PENDING  Incomplete  Gram stain     Status: None   Collection Time: 11/08/19 10:15 AM   Specimen: PATH Cytology Pleural fluid  Result Value Ref Range Status   Specimen Description PLEURAL FLUID  Final   Special Requests NONE  Final   Gram Stain   Final    WBC PRESENT, PREDOMINANTLY PMN NO ORGANISMS SEEN CYTOSPIN SMEAR Performed at [facility], [place] [address] [place], [place] [address]    Report Status 11/08/2019 FINAL  Final  Culture, body fluid-bottle     Status: None (Preliminary result)   Collection Time: 11/08/19 10:15 AM   Specimen: Pleura  Result Value Ref Range Status   Specimen Description PLEURAL FLUID  Final   Special Requests NONE  Final   Culture   Final    NO GROWTH 1 DAY Performed at [facility], [place] [address] [place], [place] [address]    Report Status PENDING  Incomplete     Labs: BNP (last 3 results) Recent Labs    05/12/19 1026 06/11/19 1204 09/28/19 1310  BNP 297.0* 197.3* 263.3*   Basic Metabolic Panel: Recent Labs  Lab 11/07/19 1048 11/08/19 1048 11/09/19 0737 11/10/19 0453  NA 142 141 140 137  K 3.9 4.0 3.7 3.6  CL 111 110 112* 113*  CO2 19* 18* 19* 18*   GLUCOSE 112* 67* 73 70  BUN 50* 48* 41* 29*  CREATININE 2.95* 2.44* 1.96* 1.52*  CALCIUM 8.2* 7.2* 7.0* 6.8*   Liver Function Tests: Recent Labs  Lab 11/07/19 1048 11/07/19 1955 11/08/19 1048 11/09/19 0737 11/10/19 0453  AST 1,062* 498* 252* 93* 45*  ALT 545* 323* 238* 146* 94*  ALKPHOS 981* 769* 679* 475* 375*  BILITOT 1.4* 1.8* 1.1 0.8 0.8  PROT 6.3* 4.9* 5.0* 4.8* 4.3*  ALBUMIN 2.7* 2.0* 2.1* 1.8* 1.7*   Recent Labs  Lab 11/07/19 1048  LIPASE 49   No results for input(s): AMMONIA in the last 168 hours. CBC: Recent Labs  Lab 11/07/19 1048 11/08/19 1048 11/09/19 0737  WBC 19.3* 14.8* 7.8  NEUTROABS  --   --  7.1  HGB 9.0* 8.6* 8.2*  HCT 29.2* 27.9* 26.4*  MCV 95.1 96.9 97.1  PLT 171 166 150   Cardiac Enzymes: No results for input(s): CKTOTAL, CKMB, CKMBINDEX, TROPONINI in the last 168 hours. BNP: Invalid input(s): POCBNP CBG: No results for input(s): GLUCAP in the last 168 hours. D-Dimer No results for input(s): DDIMER in the last 72 hours. Hgb A1c No results for input(s): HGBA1C in the last 72 hours. Lipid Profile No results for input(s): CHOL, HDL, LDLCALC, TRIG, CHOLHDL, LDLDIRECT in the last 72 hours. Thyroid function studies No results for input(s): TSH, T4TOTAL, T3FREE, THYROIDAB in the last 72 hours.  Invalid input(s): FREET3 Anemia work up No results for input(s): VITAMINB12, FOLATE, FERRITIN, TIBC, IRON, RETICCTPCT in the last 72 hours. Urinalysis    Component Value Date/Time   COLORURINE AMBER (A) 11/07/2019 1955   APPEARANCEUR CLEAR 11/07/2019 1955   LABSPEC 1.012 11/07/2019 1955   PHURINE 5.0 11/07/2019 1955   GLUCOSEU NEGATIVE 11/07/2019 1955   HGBUR NEGATIVE 11/07/2019 1955   BILIRUBINUR NEGATIVE 11/07/2019 [place] NEGATIVE 11/07/2019 1955   PROTEINUR NEGATIVE 11/07/2019 1955   NITRITE NEGATIVE 11/07/2019 1955   LEUKOCYTESUR NEGATIVE 11/07/2019 1955   Sepsis Labs Invalid input(s): PROCALCITONIN,  WBC,   LACTICIDVEN Microbiology Recent Results (from the past 240 hour(s))  Culture, blood (Routine  x 2)     Status: Abnormal   Collection Time: 11/07/19 11:54 AM   Specimen: BLOOD  Result Value Ref Range Status   Specimen Description BLOOD LEFT ANTECUBITAL  Final   Special Requests   Final    BOTTLES DRAWN AEROBIC AND ANAEROBIC Blood Culture adequate volume   Culture  Setup Time   Final    GRAM NEGATIVE RODS AEROBIC BOTTLE ONLY CRITICAL VALUE NOTED.  VALUE IS CONSISTENT WITH PREVIOUSLY REPORTED AND CALLED VALUE.    Culture (A)  Final    PSEUDOMONAS AERUGINOSA SUSCEPTIBILITIES PERFORMED ON PREVIOUS CULTURE WITHIN THE LAST 5 DAYS. Performed at [facility], [place] [address] [place], [place] [address]    Report Status 11/10/2019 FINAL  Final  Culture, blood (Routine x 2)     Status: Abnormal   Collection Time: 11/07/19 12:35 PM   Specimen: BLOOD  Result Value Ref Range Status   Specimen Description BLOOD RIGHT ANTECUBITAL  Final   Special Requests   Final    BOTTLES DRAWN AEROBIC AND ANAEROBIC Blood Culture adequate volume   Culture  Setup Time   Final    GRAM NEGATIVE RODS AEROBIC BOTTLE ONLY CRITICAL RESULT CALLED TO, READ BACK BY AND VERIFIED WITH: T. DANG PHARMD, AT 0809 11/08/19 BY Rush Landmark Performed at [facility], [place] [address] [place],  [address]    Culture PSEUDOMONAS AERUGINOSA (A)  Final   Report Status 11/10/2019 FINAL  Final   Organism ID, Bacteria PSEUDOMONAS AERUGINOSA  Final      Susceptibility   Pseudomonas aeruginosa - MIC*    CEFTAZIDIME 2 SENSITIVE Sensitive     CIPROFLOXACIN 0.5 SENSITIVE Sensitive     GENTAMICIN <=1 SENSITIVE Sensitive     IMIPENEM 2 SENSITIVE Sensitive     PIP/TAZO 8 SENSITIVE Sensitive     CEFEPIME 2 SENSITIVE Sensitive     * PSEUDOMONAS AERUGINOSA  Blood Culture ID Panel (Reflexed)     Status: Abnormal   Collection Time: 11/07/19 12:35 PM  Result Value Ref Range Status   Enterococcus species NOT DETECTED NOT  DETECTED Final   Listeria monocytogenes NOT DETECTED NOT DETECTED Final   Staphylococcus species NOT DETECTED NOT DETECTED Final   Staphylococcus aureus (BCID) NOT DETECTED NOT DETECTED Final   Streptococcus species NOT DETECTED NOT DETECTED Final   Streptococcus agalactiae NOT DETECTED NOT DETECTED Final   Streptococcus pneumoniae NOT DETECTED NOT DETECTED Final   Streptococcus pyogenes NOT DETECTED NOT DETECTED Final   Acinetobacter baumannii NOT DETECTED NOT DETECTED Final   Enterobacteriaceae species NOT DETECTED NOT DETECTED Final   Enterobacter cloacae complex NOT DETECTED NOT DETECTED Final   Escherichia coli NOT DETECTED NOT DETECTED Final   Klebsiella oxytoca NOT DETECTED NOT DETECTED Final   Klebsiella pneumoniae NOT DETECTED NOT DETECTED Final   Proteus species NOT DETECTED NOT DETECTED Final   Serratia marcescens NOT DETECTED NOT DETECTED Final   Carbapenem resistance NOT DETECTED NOT DETECTED Final   Haemophilus influenzae NOT DETECTED NOT DETECTED Final   Neisseria meningitidis NOT DETECTED NOT DETECTED Final   Pseudomonas aeruginosa DETECTED (A) NOT DETECTED Final    Comment: CRITICAL RESULT CALLED TO, READ BACK BY AND VERIFIED WITH: T. DANG PHARMD, AT 0809 11/08/19 BY D. VANHOOK    Candida albicans NOT DETECTED NOT DETECTED Final   Candida glabrata NOT DETECTED NOT DETECTED Final   Candida krusei NOT DETECTED NOT DETECTED Final   Candida parapsilosis NOT DETECTED NOT DETECTED Final   Candida tropicalis NOT  DETECTED NOT DETECTED Final    Comment: Performed at [facility], [place] [address] [place], [place] [address]  SARS Coronavirus 2 by RT PCR (hospital order, performed in [facility] hospital lab) Nasopharyngeal Nasopharyngeal Swab     Status: None   Collection Time: 11/07/19 12:45 PM   Specimen: Nasopharyngeal Swab  Result Value Ref Range Status   SARS Coronavirus 2 NEGATIVE NEGATIVE Final    Comment: (NOTE) SARS-CoV-2 target nucleic acids are NOT  DETECTED.  The SARS-CoV-2 RNA is generally detectable in upper and lower respiratory specimens during the acute phase of infection. The lowest concentration of SARS-CoV-2 viral copies this assay can detect is 250 copies / mL. A negative result does not preclude SARS-CoV-2 infection and should not be used as the sole basis for treatment or other patient management decisions.  A negative result may occur with improper specimen collection / handling, submission of specimen other than nasopharyngeal swab, presence of viral mutation(s) within the areas targeted by this assay, and inadequate number of viral copies (<250 copies / mL). A negative result must be combined with clinical observations, patient history, and epidemiological information.  Fact Sheet for Patients:   StrictlyIdeas.no  Fact Sheet for Healthcare Providers: BankingDealers.co.za  This test is not yet approved or  cleared by the [place] FDA and has been authorized for detection and/or diagnosis of SARS-CoV-2 by FDA under an Emergency Use Authorization (EUA).  This EUA will remain in effect (meaning this test can be used) for the duration of the COVID-19 declaration under Section 564(b)(1) of the Act, 21 U.S.C. section 360bbb-3(b)(1), unless the authorization is terminated or revoked sooner.  Performed at [facility], [place] [address] [place], [place] [address]   Urine culture     Status: Abnormal (Preliminary result)   Collection Time: 11/07/19  7:55 PM   Specimen: In/Out Cath Urine  Result Value Ref Range Status   Specimen Description IN/OUT CATH URINE  Final   Special Requests NONE  Final   Culture (A)  Final    1,000 COLONIES/mL PROTEUS MIRABILIS 20,000 COLONIES/mL ENTEROCOCCUS FAECALIS SUSCEPTIBILITIES TO FOLLOW Performed at [facility], [place] [address] [place], [place] [address]    Report Status PENDING  Incomplete  Gram stain     Status: None    Collection Time: 11/08/19 10:15 AM   Specimen: PATH Cytology Pleural fluid  Result Value Ref Range Status   Specimen Description PLEURAL FLUID  Final   Special Requests NONE  Final   Gram Stain   Final    WBC PRESENT, PREDOMINANTLY PMN NO ORGANISMS SEEN CYTOSPIN SMEAR Performed at [facility], [place] [address] [place], [place] [address]    Report Status 11/08/2019 FINAL  Final  Culture, body fluid-bottle     Status: None (Preliminary result)   Collection Time: 11/08/19 10:15 AM   Specimen: Pleura  Result Value Ref Range Status   Specimen Description PLEURAL FLUID  Final   Special Requests NONE  Final   Culture   Final    NO GROWTH 1 DAY Performed at [facility], [place] [address], [place],  [address]    Report Status PENDING  Incomplete    Please note: You were cared for by a hospitalist during your hospital stay. Once you are discharged, your primary care physician will handle any further medical issues. Please note that NO REFILLS for any discharge medications will be authorized once you are discharged, as it is imperative  that you return to your primary care physician (or establish a relationship with a primary care physician if you do not have one) for your post hospital discharge needs so that they can reassess your need for medications and monitor your lab values.    Time coordinating discharge: 40 minutes  SIGNED:   Shelly Coss, MD  [facility] 11/10/2019, 10:58 AM Pager [phone]  If 7PM-7AM, please contact night-coverage [email or web address] Password [email or web address]

## 2019-11-10 NOTE — Plan of Care (Signed)
Poc progressing.  

## 2019-11-10 NOTE — Progress Notes (Signed)
Pharmacy Antibiotic Note  [name] is a 59 y.o. male admitted on 11/07/2019 with Pseudomonas bacteremia.  Pharmacy has been consulted for Cefepime dosing.   Scr is trending down at 1.52 today. Patient is responding well to Cefepime, currently day #4 of therapy :  LA improving. WBC improved to 7.8. Patient is afebrile.   Plan: Increase Cefepime to 2 g IV every 12 hours with improved renal function. Monitor renal function, culture results, and clinical status.  Follow-up length of therapy.  Height: 5' 6" (167.6 cm) Weight: 49.7 kg (109 lb 9.1 oz) IBW/kg (Calculated) : 63.8  Temp (24hrs), Avg:98.3 F (36.8 C), Min:97.7 F (36.5 C), Max:98.6 F (37 C)  Recent Labs  Lab 11/07/19 1048 11/07/19 1058 11/07/19 1235 11/07/19 1418 11/07/19 1636 11/08/19 0022 11/08/19 1048 11/09/19 0737 11/10/19 0453  WBC 19.3*  --   --   --   --   --  14.8* 7.8  --   CREATININE 2.95*  --   --   --   --   --  2.44* 1.96* 1.52*  LATICACIDVEN  --  3.1* 5.0* 3.2* 2.6* 1.2  --   --   --     Estimated Creatinine Clearance: 35.9 mL/min (A) (by C-G formula based on SCr of 1.52 mg/dL (H)).    Allergies  Allergen Reactions  . Oxycodone Other (See Comments)    Hallucinations    Antimicrobials this admission: Cefepime 7/16>> Flagyl 7/16>>7/17 Vanc 7/16>>7/17  Dose adjustments this admission:   Microbiology results: Ucx 7/16 >> 1k proteus, 20k enterococcus faecalis (pending susc)  Bcx 7/16 >> 2/4 pseudo, no resistance noted Pleural fluid 7/17 >>  Thank you for allowing pharmacy to be a part of this patient's care.  Sloan Leiter, PharmD, BCPS, BCCCP Clinical Pharmacist Please refer to [facility] for [place] numbers 11/10/2019 9:10 AM

## 2019-11-10 NOTE — Progress Notes (Signed)
Nutrition Brief Note  RD consulted for assessment of nutrition status and needs.   Wt Readings from Last 15 Encounters:  11/08/19 49.7 kg  10/12/19 56.1 kg  09/26/19 55.6 kg  09/25/19 55.3 kg  09/23/19 55.3 kg  09/08/19 55 kg  06/11/19 55.6 kg  03/03/19 49.6 kg  02/25/19 48.4 kg  02/14/19 48.6 kg  02/04/19 49.9 kg  02/03/19 50.4 kg  01/29/19 53.2 kg  12/17/18 53.1 kg  11/01/18 51.7 kg   Patient is a 59 year old male with history of coronary artery disease status post RCA stent, chronic systolic heart failure, pericarditis effusion status post pericardial window 2010, hypertension, asthma, AVM with history of GI bleed, CKD stage IIIa, hyperlipidemia, recent abdominal surgery who presents with nausea and vomiting.  He denies any diarrhea or abdominal pain.  Recently had exploratory laparotomy with lysis of adhesions by Dr. Donne Hazel on 10/06/2019 following SBO.  No history of alcohol abuse.  On presentation he was febrile, tachycardic, hypotensive.  Pt admitted with suspected sepsis secondary to intra-abdominal source.   7/17- s/p rt thoracentesis (850 ml yellow fluid removed)  Reviewed I/O's: -563 ml x 24 hours and +1.6 L since admission  UOP: 1.1 L x 24 hours  Spoke with pt, who reports he is going home today. RN in room providing discharge instructions to family members. He denies any further needs at this time.   Medications reviewed and include KCl and maxipime.   Labs reviewed.   Body mass index is 17.68 kg/m. Patient meets criteria for underweight based on current BMI. RD identified pt with malnutrition during prior admission last month; suspect this is still ongoing. Noted tp has experienced a 12.5% wt loss over the past month, which is significant for time frame.   Current diet order Heart Healthy, patient is consuming approximately n/a% of meals at this time. Labs and medications reviewed.   If nutrition issues arise, please consult RD.   Loistine Chance, RD, LDN,  [place] Registered Dietitian II Certified Diabetes Care and Education Specialist Please refer to [facility] for RD and/or RD on-call/weekend/after hours pager

## 2019-11-10 NOTE — Progress Notes (Signed)
Pt discharged from unit. Medication/discharge instruction given, VVS  Phoebe Sharps, RN

## 2019-11-11 ENCOUNTER — Telehealth (HOSPITAL_COMMUNITY): Payer: Self-pay

## 2019-11-11 NOTE — Telephone Encounter (Signed)
She has avs from hospital but states she feels as if he needs to d/c some of his bp meds because his bp drops too low

## 2019-11-11 NOTE — Telephone Encounter (Signed)
Patients wife called requesting directions on how patient should be taking his medication. She states that his bp was all over the place while in the hospital but states they didn't give him any medications while here. Please advise

## 2019-11-12 NOTE — Addendum Note (Signed)
Addended by: Scarlette Calico on: 11/12/2019 04:46 PM   Modules accepted: Orders

## 2019-11-12 NOTE — Telephone Encounter (Signed)
Discussed w/Dr Bensimhon, he would pt to restart all meds at previous dose except Amlodipine, hold off it for now.    Pt's wife is aware, agreeable, and verbalized understanding.

## 2019-11-12 NOTE — Telephone Encounter (Signed)
Wife called back to further inquire about pt's medications. Pt was hospitalized for sepsis, was hypotensive and dehydrated, was given IV fluids, renal fx and BP improved before d/c. She states they were told to f/u with our office on what to do with heart/BP medications. Prior to hospitalization pt was on Amlodipine 5 mg Daily, Torsemide 20 mg Daily, sildenafil 40 mg TID, and Opsumit 10 mg Daily. She states she did not give patient any medications yesterday or today and is waiting to hear from [place]. She is unable to check BP at home but states pt is feeling better. Advised would discuss w/Dr Bensimhon and call her back.

## 2019-11-13 LAB — CULTURE, BODY FLUID W GRAM STAIN -BOTTLE: Culture: NO GROWTH

## 2019-11-14 ENCOUNTER — Other Ambulatory Visit: Payer: Self-pay

## 2019-11-14 ENCOUNTER — Ambulatory Visit (HOSPITAL_COMMUNITY)
Admission: RE | Admit: 2019-11-14 | Discharge: 2019-11-14 | Disposition: A | Discharge status: Home / Self Care | Payer: 59 | Source: Ambulatory Visit | Attending: Cardiovascular Disease | Admitting: Cardiovascular Disease

## 2019-11-14 DIAGNOSIS — R0989 Other specified symptoms and signs involving the circulatory and respiratory systems: Secondary | ICD-10-CM | POA: Insufficient documentation

## 2019-11-14 DIAGNOSIS — F17200 Nicotine dependence, unspecified, uncomplicated: Secondary | ICD-10-CM | POA: Insufficient documentation

## 2019-11-15 ENCOUNTER — Other Ambulatory Visit: Payer: Self-pay | Admitting: Rheumatology

## 2019-11-17 NOTE — Telephone Encounter (Signed)
Last Visit: 09/23/2019 Next Visit: 03/23/2020 Labs: 11/09/2019 RBC 2.72, Hgb 8.2, Hct 26.4, RDW 16.4, Lymphs Abs 0.2, Total Protein 4.8, Albumin 1.8, AST 93, ALT 146, Alk. Phos 475, Chloride 113, CO2 18 BUN 29, Calcium 6.8, Creatinine 1.52, GFR 56  Current Dose per office note 09/23/2019: allopurinol 100 mg daily  DX: Idiopathic chronic gout of multiple sites without tophus  Okay to refill Allopurinol? 

## 2019-12-04 ENCOUNTER — Other Ambulatory Visit (HOSPITAL_COMMUNITY): Payer: Self-pay

## 2019-12-05 ENCOUNTER — Other Ambulatory Visit: Payer: Self-pay

## 2019-12-05 ENCOUNTER — Encounter (HOSPITAL_COMMUNITY)
Admission: RE | Admit: 2019-12-05 | Discharge: 2019-12-05 | Disposition: A | Discharge status: Home / Self Care | Payer: 59 | Source: Ambulatory Visit | Attending: Nephrology | Admitting: Nephrology

## 2019-12-05 VITALS — BP 136/85 | HR 80 | Temp 97.8°F | Resp 18

## 2019-12-05 DIAGNOSIS — N183 Chronic kidney disease, stage 3 unspecified: Secondary | ICD-10-CM | POA: Diagnosis not present

## 2019-12-05 LAB — POCT HEMOGLOBIN-HEMACUE: Hemoglobin: 8.6 g/dL — ABNORMAL LOW (ref 13.0–17.0)

## 2019-12-05 MED ORDER — EPOETIN ALFA-EPBX 10000 UNIT/ML IJ SOLN
10000.0000 [IU] | INTRAMUSCULAR | Status: DC
  Administered 2019-12-05: 10000 [IU] via SUBCUTANEOUS

## 2019-12-05 MED ORDER — EPOETIN ALFA-EPBX 10000 UNIT/ML IJ SOLN
INTRAMUSCULAR | Status: AC
  Filled 2019-12-05: qty 1

## 2019-12-05 NOTE — Discharge Instructions (Signed)

## 2019-12-19 ENCOUNTER — Ambulatory Visit: Payer: 59 | Admitting: Gastroenterology

## 2019-12-19 ENCOUNTER — Encounter: Payer: Self-pay | Admitting: Gastroenterology

## 2019-12-19 VITALS — BP 118/62 | HR 88 | Ht 68.0 in | Wt 111.0 lb

## 2019-12-19 DIAGNOSIS — D126 Benign neoplasm of colon, unspecified: Secondary | ICD-10-CM

## 2019-12-19 DIAGNOSIS — K5520 Angiodysplasia of colon without hemorrhage: Secondary | ICD-10-CM

## 2019-12-19 DIAGNOSIS — Z86010 Personal history of colonic polyps: Secondary | ICD-10-CM | POA: Diagnosis not present

## 2019-12-19 DIAGNOSIS — I509 Heart failure, unspecified: Secondary | ICD-10-CM

## 2019-12-19 DIAGNOSIS — N183 Chronic kidney disease, stage 3 unspecified: Secondary | ICD-10-CM

## 2019-12-19 DIAGNOSIS — I519 Heart disease, unspecified: Secondary | ICD-10-CM

## 2019-12-19 DIAGNOSIS — K219 Gastro-esophageal reflux disease without esophagitis: Secondary | ICD-10-CM | POA: Diagnosis not present

## 2019-12-19 DIAGNOSIS — Z8719 Personal history of other diseases of the digestive system: Secondary | ICD-10-CM

## 2019-12-19 DIAGNOSIS — R7989 Other specified abnormal findings of blood chemistry: Secondary | ICD-10-CM

## 2019-12-19 NOTE — Patient Instructions (Addendum)
If you are age 61 or older, your body mass index should be between 23-30. Your Body mass index is 16.88 kg/m. If this is out of the aforementioned range listed, please consider follow up with your Primary Care Provider.  If you are age 32 or younger, your body mass index should be between 19-25. Your Body mass index is 16.88 kg/m. If this is out of the aformentioned range listed, please consider follow up with your Primary Care Provider.   Follow up in six months.  Please call the office for an appointment as the schedule is not available at this time.   It was a pleasure to see you today!  Vito Cirigliano, D.O.

## 2019-12-19 NOTE — Progress Notes (Signed)
P  Chief Complaint:    Hospital follow-up  GI History: 59 year old male with history of CAD, CHF, pulmonary hypertension, pericardial effusion s/p pericardial window 2010 and 2020, CKD, scleroderma, Raynaud's, HTN, HLD, duodenal/colonic AVMs treated with APC and IR coil ablation of cecal artery in 08/2018.  Also with history of elevated liver enzymes in 2020, which essentially normalized (elevated again on recent admission in the setting gram-negative bacteremia) with work-up as below: -ANA+at 1:1280 and a nuclear, speckled pattern (has history of scleroderma andRaynaud's though) -APAP, acute hepatitis panel negative/normal -IgA mildly elevated at 316 (ULN 310) -Normal IgG, IgM, ceruloplasmin, ferritin, ASMA, LKM, AMA -RUQ [place] with Doppler: Normal, bilateral pleural effusions  History of GERD, generally well controlled when taking Protonix.  Endoscopic history: -EGD (08/30/2018, Dr. Bryan Lemma): 4 duodenal AVMs treated with APC,polypoid cystic type lesion in third portion of the duodenum (biopsy benign), nodular area in the second portion of the duodenum (biopsybenign), benign lymphangiectasia in the small bowel, and diffuse, moderate gastritis (biopsywith benign fundic gland polyps, no H. pylori). Recommended repeat upper endoscopy in 8 weeks. -Colonoscopy (08/30/2018, Dr. Deivi Huckins):multiple bleeding AVMs in the cecum, treated with APC, with additional AVMs in the ascending, descending, sigmoid colon, all treated with APC. 12 mm tubular adenoma resected from transverse colon. Prep was poor, with recommendation repeat in 6 months - VCE (09/2018, Dr. Cyani Kallstrom):AVMs noted in the proximal small bowel, starting at 00:27:42, and mid small intestine, located at 02:10, 02:32, 02:57 and 04:03. Capsule did not transit into the cecum before end of study. -2020: Declined push enteroscopy, single balloon enteroscopy.  Declined medical management of AVMs due to cardiovascular risk  profile -2020: Declined colonoscopy for polyp surveillance  HPI:     Patient is a 59 y.o. male presenting to the Gastroenterology Clinic for hospital follow-up.  Last seen in the GI clinic on 09/25/2019 by Alonza Bogus.  Admitted in 09/2019 with SBO, undergoing ex lap with LOA by Dr. Donne Hazel on 10/06/2019.  Readmitted 7/16-19 with nausea/vomiting, leukocytosis, positive SIRS, lactic acidosis, AKI on CKD, right pleural effusion.  Treated with broad-spectrum antibiotics, thoracentesis.  Blood culture with Pseudomonas aeruginosa, and discharged with Cipro x2 weeks.  Today, he states he feels well. Tolerating all PO intake.  No melena or hematochezia.  Has f/u with Dr. Donne Hazel next week. Wound still open, but improving.   Hgb at discharge 8.2 on 11/09/19, and 8.6 on 12/05/19. Receives ESA via Nephrology Clinic for goal Hgb 10-12 per review of prior notes. Has f/u appt next month with Nephrology and Cardiology clinic.    Review of systems:     No chest pain, no SOB, no fevers, no urinary sx   Past Medical History:  Diagnosis Date  . Anxiety   . CAD (coronary artery disease)   . Gout   . Hyperlipidemia   . Hypertension 05/14/2012    Lexiscan-- EF 51% ,LV normal  . Hypothyroid   . MI (myocardial infarction) ([place]) 2010  . Pericardial effusion 12/2008   Dr [name] performed a subxiphoid window removing 222m of fluid  . Pericardial effusion 03/09/2010   Echo-LVEF >55%, very small pericardial effusion ,,Stage 1 (impaired ) diastolic fxn, elevated LV filling  . Pericarditis   . Raynaud's phenomenon   . [place]   . Smoker 09/16/2018   1 ppd  . Vitiligo     Patient's surgical history, family medical history, social history, medications and allergies were all reviewed in Epic    Current Outpatient Medications  Medication Sig  Dispense Refill  . acetaminophen (TYLENOL) 500 MG tablet Take 500-1,000 mg by mouth every 6 (six) hours as needed for mild pain or headache.    .  allopurinol (ZYLOPRIM) 100 MG tablet TAKE 1 TABLET BY MOUTH EVERY DAY 30 tablet 2  . colchicine 0.6 MG tablet Take 0.6 mg by mouth daily as needed (gout attacks).    [name] Epoetin Alfa-epbx (RETACRIT IJ) Inject as directed every 30 (thirty) days.    [name] escitalopram (LEXAPRO) 5 MG tablet Take 5 mg by mouth daily.    . feeding supplement, ENSURE ENLIVE, (ENSURE ENLIVE) LIQD Take 237 mLs by mouth 3 (three) times daily between meals. 237 mL 12  . fluticasone (FLONASE) 50 MCG/ACT nasal spray Place 2 sprays into both nostrils daily as needed (seasonal allergies).   11  . HYDROcodone-acetaminophen (NORCO) 10-325 MG tablet Take 0.5-1 tablets by mouth every 6 (six) hours as needed for moderate pain.    . macitentan (OPSUMIT) 10 MG tablet Take 1 tablet (10 mg total) by mouth daily. 90 tablet 3  . pantoprazole (PROTONIX) 40 MG tablet Take 1 tablet (40 mg total) by mouth daily. (Patient taking differently: Take 40 mg by mouth daily before breakfast. ) 180 tablet 5  . potassium chloride SA (KLOR-CON) 20 MEQ tablet Take 2 tablets (40 mEq total) by mouth 2 (two) times daily. (Patient taking differently: Take 20 mEq by mouth daily. ) 60 tablet 1  . sildenafil (REVATIO) 20 MG tablet Take 2 tablets (40 mg total) by mouth 3 (three) times daily. (Patient taking differently: Take 40 mg by mouth 2 (two) times daily. ) 180 tablet 6  . torsemide (DEMADEX) 20 MG tablet Take 1 tablet (20 mg total) by mouth daily. (Patient taking differently: Take 20 mg by mouth in the morning. ) 60 tablet 6   No current facility-administered medications for this visit.    Physical Exam:     BP 118/62   Pulse 88   Ht _0  (1.727 m)   Wt 111 lb (50.3 kg)   BMI 16.88 kg/m   GENERAL:  Pleasant male in NAD PSYCH: : Cooperative, normal affect EENT:  conjunctiva pink, mucous membranes moist, neck supple without masses CARDIAC:  RRR, no murmur heard, no peripheral edema PULM: Normal respiratory effort, lungs CTA bilaterally, no  wheezing ABDOMEN:  Midline incision with wound dressing in place and c/d/i. Nondistended.  SKIN:  turgor, no lesions seen Musculoskeletal:  Normal muscle tone, normal strength NEURO: Alert and oriented x 3, no focal neurologic deficits   IMPRESSION and PLAN:    1) History of AVMs: -No recent overt GI blood loss -Continue serial H/H checks.  Receiving ESA with Nephrology clinic -No plan for repeat endoscopic procedures at this time given recent cough and exertional course, current abdominal surgical wound, etc. -Again discussed medical management options for AVMs with patient and his wife today, and they again declined any of those additional therapies (i.e. octreotide) -No longer taking any antiplatelet/anticoagulation  2) GERD -Well controlled Protonix 40 mg/tab -Resume antireflux lifestyle/dietary modifications  3) History of tubular adenomas -Due for repeat colonoscopy for ongoing polyp surveillance.  However, in light of recent complicated hospital admissions, agree with patient and his wife for postponing any elective type procedures at this time -Will revisit appropriate timing of colonoscopy at follow-up appointment  4) Constipation: -Well-controlled  5) CAD 6) CHF -Any endoscopic procedures need to be done at [facility] given comorbidities and potential need for APC  7) Elevated liver enzymes: -  Liver enzymes have normalized, then elevated and setting gram-negative bacteremia.  They were downtrending during hospital admission -Repeat liver enzymes with next set of routine labs to ensure returning to normal  8) History of Small bowel obstruction: -Follow-up in the general surgery clinic next week  9) CKD 10) Anemia of chronic disease/anemia from chronic blood loss -Has follow-up in the Nephrology clinic next week  RTC in 6 months or sooner as needed  I spent 30 minutes of time, including in depth chart review, independent review of results as outlined above,  communicating results with the patient directly, face-to-face time with the patient, coordinating care, and ordering studies and medications as appropriate, and documentation.          Lavena Bullion ,DO, FACG 12/19/2019, 11:33 AM

## 2020-01-02 ENCOUNTER — Other Ambulatory Visit: Payer: Self-pay

## 2020-01-02 ENCOUNTER — Ambulatory Visit (HOSPITAL_COMMUNITY)
Admission: RE | Admit: 2020-01-02 | Discharge: 2020-01-02 | Disposition: A | Discharge status: Home / Self Care | Payer: 59 | Source: Ambulatory Visit | Attending: Nephrology | Admitting: Nephrology

## 2020-01-02 VITALS — BP 146/81 | HR 84 | Temp 97.9°F | Resp 18

## 2020-01-02 DIAGNOSIS — N183 Chronic kidney disease, stage 3 unspecified: Secondary | ICD-10-CM | POA: Insufficient documentation

## 2020-01-02 LAB — RENAL FUNCTION PANEL
Albumin: 3 g/dL — ABNORMAL LOW (ref 3.5–5.0)
Anion gap: 12 (ref 5–15)
BUN: 53 mg/dL — ABNORMAL HIGH (ref 8–23)
CO2: 19 mmol/L — ABNORMAL LOW (ref 22–32)
Calcium: 6.4 mg/dL — CL (ref 8.9–10.3)
Chloride: 111 mmol/L (ref 98–111)
Creatinine, Ser: 2.47 mg/dL — ABNORMAL HIGH (ref 0.61–1.24)
GFR calc Af Amer: 31 mL/min — ABNORMAL LOW (ref >60)
GFR calc non Af Amer: 27 mL/min — ABNORMAL LOW (ref >60)
Glucose, Bld: 89 mg/dL (ref 70–99)
Phosphorus: 3.8 mg/dL (ref 2.5–4.6)
Potassium: 3.4 mmol/L — ABNORMAL LOW (ref 3.5–5.1)
Sodium: 142 mmol/L (ref 135–145)

## 2020-01-02 LAB — POCT HEMOGLOBIN-HEMACUE: Hemoglobin: 9.1 g/dL — ABNORMAL LOW (ref 13.0–17.0)

## 2020-01-02 LAB — IRON AND TIBC
Iron: 41 ug/dL — ABNORMAL LOW (ref 45–182)
Saturation Ratios: 18 % (ref 17.9–39.5)
TIBC: 223 ug/dL — ABNORMAL LOW (ref 250–450)
UIBC: 182 ug/dL

## 2020-01-02 MED ORDER — EPOETIN ALFA-EPBX 10000 UNIT/ML IJ SOLN
10000.0000 [IU] | INTRAMUSCULAR | Status: DC
  Administered 2020-01-02: 10000 [IU] via SUBCUTANEOUS

## 2020-01-02 MED ORDER — EPOETIN ALFA-EPBX 10000 UNIT/ML IJ SOLN
INTRAMUSCULAR | Status: AC
  Filled 2020-01-02: qty 1
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
## 2020-01-09 ENCOUNTER — Ambulatory Visit (HOSPITAL_BASED_OUTPATIENT_CLINIC_OR_DEPARTMENT_OTHER)
Admission: RE | Admit: 2020-01-09 | Discharge: 2020-01-09 | Disposition: A | Discharge status: Home / Self Care | Payer: 59 | Source: Ambulatory Visit | Attending: Internal Medicine | Admitting: Internal Medicine

## 2020-01-09 ENCOUNTER — Ambulatory Visit (HOSPITAL_COMMUNITY)
Admission: RE | Admit: 2020-01-09 | Discharge: 2020-01-09 | Disposition: A | Discharge status: Home / Self Care | Payer: 59 | Source: Ambulatory Visit | Attending: Internal Medicine | Admitting: Internal Medicine

## 2020-01-09 ENCOUNTER — Other Ambulatory Visit: Payer: Self-pay

## 2020-01-09 ENCOUNTER — Encounter (HOSPITAL_COMMUNITY): Payer: Self-pay | Admitting: Internal Medicine

## 2020-01-09 VITALS — BP 148/84 | HR 58 | Ht 68.0 in | Wt 113.4 lb

## 2020-01-09 DIAGNOSIS — I313 Pericardial effusion (noninflammatory): Secondary | ICD-10-CM

## 2020-01-09 DIAGNOSIS — F419 Anxiety disorder, unspecified: Secondary | ICD-10-CM | POA: Insufficient documentation

## 2020-01-09 DIAGNOSIS — I129 Hypertensive chronic kidney disease with stage 1 through stage 4 chronic kidney disease, or unspecified chronic kidney disease: Secondary | ICD-10-CM | POA: Diagnosis not present

## 2020-01-09 DIAGNOSIS — E785 Hyperlipidemia, unspecified: Secondary | ICD-10-CM | POA: Diagnosis not present

## 2020-01-09 DIAGNOSIS — N179 Acute kidney failure, unspecified: Secondary | ICD-10-CM | POA: Diagnosis not present

## 2020-01-09 DIAGNOSIS — Z79899 Other long term (current) drug therapy: Secondary | ICD-10-CM | POA: Diagnosis not present

## 2020-01-09 DIAGNOSIS — E876 Hypokalemia: Secondary | ICD-10-CM | POA: Insufficient documentation

## 2020-01-09 DIAGNOSIS — D649 Anemia, unspecified: Secondary | ICD-10-CM | POA: Insufficient documentation

## 2020-01-09 DIAGNOSIS — N1832 Chronic kidney disease, stage 3b: Secondary | ICD-10-CM | POA: Diagnosis not present

## 2020-01-09 DIAGNOSIS — I2510 Atherosclerotic heart disease of native coronary artery without angina pectoris: Secondary | ICD-10-CM | POA: Diagnosis not present

## 2020-01-09 DIAGNOSIS — Z87891 Personal history of nicotine dependence: Secondary | ICD-10-CM | POA: Diagnosis not present

## 2020-01-09 DIAGNOSIS — E039 Hypothyroidism, unspecified: Secondary | ICD-10-CM | POA: Insufficient documentation

## 2020-01-09 DIAGNOSIS — J90 Pleural effusion, not elsewhere classified: Secondary | ICD-10-CM | POA: Diagnosis not present

## 2020-01-09 DIAGNOSIS — Z9861 Coronary angioplasty status: Secondary | ICD-10-CM

## 2020-01-09 DIAGNOSIS — I272 Pulmonary hypertension, unspecified: Secondary | ICD-10-CM | POA: Diagnosis not present

## 2020-01-09 DIAGNOSIS — M109 Gout, unspecified: Secondary | ICD-10-CM | POA: Diagnosis not present

## 2020-01-09 DIAGNOSIS — K922 Gastrointestinal hemorrhage, unspecified: Secondary | ICD-10-CM | POA: Diagnosis not present

## 2020-01-09 DIAGNOSIS — M349 Systemic sclerosis, unspecified: Secondary | ICD-10-CM | POA: Diagnosis not present

## 2020-01-09 DIAGNOSIS — Z955 Presence of coronary angioplasty implant and graft: Secondary | ICD-10-CM | POA: Insufficient documentation

## 2020-01-09 DIAGNOSIS — I3139 Other pericardial effusion (noninflammatory): Secondary | ICD-10-CM

## 2020-01-09 DIAGNOSIS — I5022 Chronic systolic (congestive) heart failure: Secondary | ICD-10-CM

## 2020-01-09 DIAGNOSIS — I252 Old myocardial infarction: Secondary | ICD-10-CM | POA: Insufficient documentation

## 2020-01-09 LAB — ECHOCARDIOGRAM COMPLETE
Area-P 1/2: 4.63 cm2
S' Lateral: 3.8 cm

## 2020-01-09 MED ORDER — SILDENAFIL CITRATE 20 MG PO TABS: 40.0000 mg | ORAL_TABLET | Freq: Three times a day (TID) | ORAL | 6 refills | Status: DC

## 2020-01-09 MED ORDER — POTASSIUM CHLORIDE CRYS ER 20 MEQ PO TBCR: 20.0000 meq | EXTENDED_RELEASE_TABLET | Freq: Two times a day (BID) | ORAL | 1 refills | Status: DC

## 2020-01-09 NOTE — Progress Notes (Signed)
  Echocardiogram 2D Echocardiogram has been performed.  [name] 01/09/2020, 11:17 AM

## 2020-01-09 NOTE — Patient Instructions (Signed)
INCREASE Sildenafil 20m (2 tablets) three times a day  INCREASE K-LOR 250m Twice daily  Your physician recommends that you follow up for labs in 2 weeks  Please call our office in February to schedule your follow up appointment  If you have any questions or concerns before your next appointment please send usKorea message through myGaylordr call our office at [phone]   TO LEAVE A MESSAGE FOR THE NURSE SELECT OPTION 2, PLEASE LEAVE A MESSAGE INCLUDING: . YOUR NAME . DATE OF BIRTH . CALL BACK NUMBER . REASON FOR CALL**this is important as we prioritize the call backs  YOLattaS LONG AS YOU CALL BEFORE 4:00 PM  At the [facility] and your health needs are our priority. As part of our continuing mission to provide you with exceptional heart care, we have created designated Provider Care Teams. These Care Teams include your primary Cardiologist (physician) and Advanced Practice Providers (APPs- Physician Assistants and Nurse Practitioners) who all work together to provide you with the care you need, when you need it.   You may see any of the following providers on your designated Care Team at your next follow up: . Marland Kitchenr DaGlori Bickers Dr DaLoralie Champagne AmDarrick GrinderNP . BrLyda JesterPA . LaAudry RilesPharmD   Please be sure to bring in all your medications bottles to every appointment.

## 2020-01-09 NOTE — Addendum Note (Signed)
Encounter addended by: Jolaine Artist, MD on: 01/09/2020 3:27 PM  Actions taken: Level of Service modified, Visit diagnoses modified

## 2020-01-18 ENCOUNTER — Telehealth: Payer: Self-pay | Admitting: Rheumatology

## 2020-01-18 NOTE — Telephone Encounter (Signed)
Patient's wife called. Renaldo was having a gout flare. He needed a refill on Colchicine which was called into the pharmacy. Colchicine 0.6 mg po qd prn flare. #30 Rx2. Bo Merino, MD

## 2020-01-21 ENCOUNTER — Other Ambulatory Visit (HOSPITAL_COMMUNITY): Payer: 59

## 2020-01-30 ENCOUNTER — Encounter (HOSPITAL_COMMUNITY): Payer: 59

## 2020-02-02 ENCOUNTER — Inpatient Hospital Stay (HOSPITAL_COMMUNITY): Admission: RE | Admit: 2020-02-02 | Payer: 59 | Source: Ambulatory Visit

## 2020-02-19 ENCOUNTER — Other Ambulatory Visit: Payer: Self-pay

## 2020-02-19 ENCOUNTER — Ambulatory Visit (HOSPITAL_COMMUNITY)
Admission: RE | Admit: 2020-02-19 | Discharge: 2020-02-19 | Disposition: A | Discharge status: Home / Self Care | Payer: 59 | Source: Ambulatory Visit | Attending: Nephrology | Admitting: Nephrology

## 2020-02-19 VITALS — BP 147/83 | HR 80

## 2020-02-19 DIAGNOSIS — N183 Chronic kidney disease, stage 3 unspecified: Secondary | ICD-10-CM | POA: Insufficient documentation

## 2020-02-19 LAB — IRON AND TIBC
Iron: 63 ug/dL (ref 45–182)
Saturation Ratios: 25 % (ref 17.9–39.5)
TIBC: 248 ug/dL — ABNORMAL LOW (ref 250–450)
UIBC: 185 ug/dL

## 2020-02-19 LAB — RENAL FUNCTION PANEL
Albumin: 3.3 g/dL — ABNORMAL LOW (ref 3.5–5.0)
Anion gap: 12 (ref 5–15)
BUN: 57 mg/dL — ABNORMAL HIGH (ref 8–23)
CO2: 21 mmol/L — ABNORMAL LOW (ref 22–32)
Calcium: 7 mg/dL — ABNORMAL LOW (ref 8.9–10.3)
Chloride: 110 mmol/L (ref 98–111)
Creatinine, Ser: 2.8 mg/dL — ABNORMAL HIGH (ref 0.61–1.24)
GFR, Estimated: 25 mL/min — ABNORMAL LOW (ref >60)
Glucose, Bld: 117 mg/dL — ABNORMAL HIGH (ref 70–99)
Phosphorus: 3.7 mg/dL (ref 2.5–4.6)
Potassium: 3.7 mmol/L (ref 3.5–5.1)
Sodium: 143 mmol/L (ref 135–145)

## 2020-02-19 LAB — POCT HEMOGLOBIN-HEMACUE: Hemoglobin: 9 g/dL — ABNORMAL LOW (ref 13.0–17.0)

## 2020-02-19 MED ORDER — EPOETIN ALFA-EPBX 10000 UNIT/ML IJ SOLN
10000.0000 [IU] | INTRAMUSCULAR | Status: DC
  Administered 2020-02-19: 10000 [IU] via SUBCUTANEOUS

## 2020-02-19 MED ORDER — EPOETIN ALFA-EPBX 10000 UNIT/ML IJ SOLN
INTRAMUSCULAR | Status: AC
  Filled 2020-02-19: qty 1

## 2020-02-23 ENCOUNTER — Other Ambulatory Visit: Payer: Self-pay | Admitting: Physician Assistant

## 2020-02-23 NOTE — Telephone Encounter (Signed)
Last Visit: 09/23/2019 Next Visit: 03/23/2020 Labs: 11/09/2019 RBC 2.72, Hgb 8.2, Hct 26.4, RDW 16.4, Lymphs Abs 0.2, Total Protein 4.8, Albumin 1.8, AST 93, ALT 146, Alk. Phos 475, Chloride 113, CO2 18 BUN 29, Calcium 6.8, Creatinine 1.52, GFR 56  Current Dose per office note 09/23/2019: allopurinol 100 mg daily  DX: Idiopathic chronic gout of multiple sites without tophus  Okay to refill Allopurinol?

## 2020-02-27 ENCOUNTER — Other Ambulatory Visit (HOSPITAL_COMMUNITY): Payer: Self-pay | Admitting: Internal Medicine

## 2020-03-01 ENCOUNTER — Encounter (HOSPITAL_COMMUNITY): Payer: 59
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
## 2020-03-19 ENCOUNTER — Other Ambulatory Visit: Payer: Self-pay

## 2020-03-19 ENCOUNTER — Ambulatory Visit (HOSPITAL_COMMUNITY)
Admission: RE | Admit: 2020-03-19 | Discharge: 2020-03-19 | Disposition: A | Discharge status: Home / Self Care | Payer: 59 | Source: Ambulatory Visit | Attending: Nephrology | Admitting: Nephrology

## 2020-03-19 VITALS — BP 146/85 | HR 87 | Temp 98.0°F | Resp 18

## 2020-03-19 DIAGNOSIS — N183 Chronic kidney disease, stage 3 unspecified: Secondary | ICD-10-CM | POA: Insufficient documentation

## 2020-03-19 LAB — RENAL FUNCTION PANEL
Albumin: 3.4 g/dL — ABNORMAL LOW (ref 3.5–5.0)
Anion gap: 12 (ref 5–15)
BUN: 69 mg/dL — ABNORMAL HIGH (ref 8–23)
CO2: 20 mmol/L — ABNORMAL LOW (ref 22–32)
Calcium: 8 mg/dL — ABNORMAL LOW (ref 8.9–10.3)
Chloride: 110 mmol/L (ref 98–111)
Creatinine, Ser: 3.05 mg/dL — ABNORMAL HIGH (ref 0.61–1.24)
GFR, Estimated: 22 mL/min — ABNORMAL LOW (ref >60)
Glucose, Bld: 129 mg/dL — ABNORMAL HIGH (ref 70–99)
Phosphorus: 3.4 mg/dL (ref 2.5–4.6)
Potassium: 4.3 mmol/L (ref 3.5–5.1)
Sodium: 142 mmol/L (ref 135–145)

## 2020-03-19 LAB — IRON AND TIBC
Iron: 59 ug/dL (ref 45–182)
Saturation Ratios: 25 % (ref 17.9–39.5)
TIBC: 237 ug/dL — ABNORMAL LOW (ref 250–450)
UIBC: 178 ug/dL

## 2020-03-19 LAB — FERRITIN: Ferritin: 432 ng/mL — ABNORMAL HIGH (ref 24–336)

## 2020-03-19 LAB — POCT HEMOGLOBIN-HEMACUE: Hemoglobin: 9.4 g/dL — ABNORMAL LOW (ref 13.0–17.0)

## 2020-03-19 MED ORDER — EPOETIN ALFA-EPBX 10000 UNIT/ML IJ SOLN
10000.0000 [IU] | INTRAMUSCULAR | Status: DC
  Administered 2020-03-19: 10000 [IU] via SUBCUTANEOUS

## 2020-03-19 MED ORDER — EPOETIN ALFA-EPBX 10000 UNIT/ML IJ SOLN
INTRAMUSCULAR | Status: AC
  Filled 2020-03-19: qty 1

## 2020-03-20 LAB — PTH, INTACT AND CALCIUM
Calcium, Total (PTH): 8 mg/dL — ABNORMAL LOW (ref 8.6–10.2)
PTH: 46 pg/mL (ref 15–65)

## 2020-03-23 ENCOUNTER — Other Ambulatory Visit: Payer: Self-pay

## 2020-03-23 ENCOUNTER — Ambulatory Visit: Payer: 59 | Admitting: Rheumatology

## 2020-03-23 ENCOUNTER — Encounter: Payer: Self-pay | Admitting: Rheumatology

## 2020-03-23 VITALS — BP 143/86 | HR 101 | Resp 14 | Ht 67.5 in | Wt 117.4 lb

## 2020-03-23 DIAGNOSIS — I7300 Raynaud's syndrome without gangrene: Secondary | ICD-10-CM | POA: Diagnosis not present

## 2020-03-23 DIAGNOSIS — I272 Pulmonary hypertension, unspecified: Secondary | ICD-10-CM

## 2020-03-23 DIAGNOSIS — M1A09X0 Idiopathic chronic gout, multiple sites, without tophus (tophi): Secondary | ICD-10-CM | POA: Diagnosis not present

## 2020-03-23 DIAGNOSIS — M349 Systemic sclerosis, unspecified: Secondary | ICD-10-CM

## 2020-03-23 DIAGNOSIS — I313 Pericardial effusion (noninflammatory): Secondary | ICD-10-CM

## 2020-03-23 DIAGNOSIS — Z9861 Coronary angioplasty status: Secondary | ICD-10-CM

## 2020-03-23 DIAGNOSIS — E559 Vitamin D deficiency, unspecified: Secondary | ICD-10-CM

## 2020-03-23 DIAGNOSIS — Z8709 Personal history of other diseases of the respiratory system: Secondary | ICD-10-CM

## 2020-03-23 DIAGNOSIS — J90 Pleural effusion, not elsewhere classified: Secondary | ICD-10-CM

## 2020-03-23 DIAGNOSIS — Z7189 Other specified counseling: Secondary | ICD-10-CM

## 2020-03-23 DIAGNOSIS — Z8679 Personal history of other diseases of the circulatory system: Secondary | ICD-10-CM

## 2020-03-23 DIAGNOSIS — I519 Heart disease, unspecified: Secondary | ICD-10-CM

## 2020-03-23 DIAGNOSIS — R7989 Other specified abnormal findings of blood chemistry: Secondary | ICD-10-CM

## 2020-03-23 DIAGNOSIS — Z87448 Personal history of other diseases of urinary system: Secondary | ICD-10-CM

## 2020-03-23 DIAGNOSIS — I2510 Atherosclerotic heart disease of native coronary artery without angina pectoris: Secondary | ICD-10-CM

## 2020-03-23 DIAGNOSIS — R3121 Asymptomatic microscopic hematuria: Secondary | ICD-10-CM

## 2020-03-24 LAB — URIC ACID: Uric Acid, Serum: 5.2 mg/dL (ref 4.0–8.0)

## 2020-03-24 LAB — ALT: ALT: 7 U/L — ABNORMAL LOW (ref 9–46)

## 2020-03-24 LAB — AST: AST: 14 U/L (ref 10–35)

## 2020-03-24 LAB — ALKALINE PHOSPHATASE: Alkaline phosphatase (APISO): 77 U/L (ref 35–144)

## 2020-03-24 NOTE — Progress Notes (Signed)
LFTs are normal now.Uric acid is in desirable range.

## 2020-03-26 ENCOUNTER — Ambulatory Visit: Payer: 59 | Attending: Internal Medicine

## 2020-03-26 DIAGNOSIS — Z23 Encounter for immunization: Secondary | ICD-10-CM

## 2020-03-26 NOTE — Progress Notes (Signed)
Covid-19 Vaccination Clinic  Name:  [name]    MRN: [id number] DOB: Sep 25, 1960  03/26/2020  [name] was observed post Covid-19 immunization for 15 minutes without incident. He was provided with Vaccine Information Sheet and instruction to access the V-Safe system.   [name] was instructed to call 911 with any severe reactions post vaccine: [name] Difficulty breathing  . Swelling of face and throat  . A fast heartbeat  . A bad rash all over body  . Dizziness and weakness   Immunizations Administered    Name Date Dose VIS Date Route   Pfizer COVID-19 Vaccine 03/26/2020  3:04 PM 0.3 mL 02/11/2020 Intramuscular   Manufacturer: [place]   Lot: [id number]   NDC: [address]-9

## 2020-04-01 ENCOUNTER — Other Ambulatory Visit: Payer: Self-pay | Admitting: Gastroenterology

## 2020-04-01 ENCOUNTER — Other Ambulatory Visit: Payer: Self-pay | Admitting: Cardiovascular Disease

## 2020-04-15 ENCOUNTER — Ambulatory Visit (HOSPITAL_COMMUNITY)
Admission: RE | Admit: 2020-04-15 | Discharge: 2020-04-15 | Disposition: A | Discharge status: Home / Self Care | Payer: 59 | Source: Ambulatory Visit | Attending: Nephrology | Admitting: Nephrology

## 2020-04-15 ENCOUNTER — Other Ambulatory Visit: Payer: Self-pay

## 2020-04-15 VITALS — BP 127/78 | HR 90 | Temp 97.7°F | Resp 18

## 2020-04-15 DIAGNOSIS — N183 Chronic kidney disease, stage 3 unspecified: Secondary | ICD-10-CM | POA: Insufficient documentation

## 2020-04-15 LAB — IRON AND TIBC
Iron: 40 ug/dL — ABNORMAL LOW (ref 45–182)
Saturation Ratios: 16 % — ABNORMAL LOW (ref 17.9–39.5)
TIBC: 258 ug/dL (ref 250–450)
UIBC: 218 ug/dL

## 2020-04-15 LAB — RENAL FUNCTION PANEL
Albumin: 3.3 g/dL — ABNORMAL LOW (ref 3.5–5.0)
Anion gap: 10 (ref 5–15)
BUN: 83 mg/dL — ABNORMAL HIGH (ref 8–23)
CO2: 21 mmol/L — ABNORMAL LOW (ref 22–32)
Calcium: 8.7 mg/dL — ABNORMAL LOW (ref 8.9–10.3)
Chloride: 108 mmol/L (ref 98–111)
Creatinine, Ser: 3.35 mg/dL — ABNORMAL HIGH (ref 0.61–1.24)
GFR, Estimated: 20 mL/min — ABNORMAL LOW (ref >60)
Glucose, Bld: 91 mg/dL (ref 70–99)
Phosphorus: 4.7 mg/dL — ABNORMAL HIGH (ref 2.5–4.6)
Potassium: 4.6 mmol/L (ref 3.5–5.1)
Sodium: 139 mmol/L (ref 135–145)

## 2020-04-15 LAB — POCT HEMOGLOBIN-HEMACUE: Hemoglobin: 9.9 g/dL — ABNORMAL LOW (ref 13.0–17.0)

## 2020-04-15 MED ORDER — EPOETIN ALFA-EPBX 10000 UNIT/ML IJ SOLN
INTRAMUSCULAR | Status: AC
  Filled 2020-04-15: qty 1

## 2020-04-15 MED ORDER — EPOETIN ALFA-EPBX 10000 UNIT/ML IJ SOLN
10000.0000 [IU] | INTRAMUSCULAR | Status: DC
  Administered 2020-04-15: 11:00:00 10000 [IU] via SUBCUTANEOUS

## 2020-04-21 ENCOUNTER — Other Ambulatory Visit: Payer: Self-pay | Admitting: Physician Assistant

## 2020-04-25 ENCOUNTER — Other Ambulatory Visit: Payer: Self-pay | Admitting: Physician Assistant

## 2020-04-28 ENCOUNTER — Other Ambulatory Visit: Payer: 59

## 2020-04-28 DIAGNOSIS — Z20822 Contact with and (suspected) exposure to covid-19: Secondary | ICD-10-CM

## 2020-04-30 LAB — NOVEL CORONAVIRUS, NAA: SARS-CoV-2, NAA: NOT DETECTED

## 2020-04-30 LAB — SARS-COV-2, NAA 2 DAY TAT

## 2020-05-08 ENCOUNTER — Other Ambulatory Visit: Payer: 59

## 2020-05-08 DIAGNOSIS — Z20822 Contact with and (suspected) exposure to covid-19: Secondary | ICD-10-CM

## 2020-05-11 LAB — NOVEL CORONAVIRUS, NAA: SARS-CoV-2, NAA: NOT DETECTED

## 2020-05-13 ENCOUNTER — Encounter (HOSPITAL_COMMUNITY)
Admission: RE | Admit: 2020-05-13 | Discharge: 2020-05-13 | Disposition: A | Discharge status: Home / Self Care | Payer: 59 | Source: Ambulatory Visit | Attending: Nephrology | Admitting: Nephrology

## 2020-05-13 ENCOUNTER — Other Ambulatory Visit: Payer: Self-pay

## 2020-05-13 ENCOUNTER — Ambulatory Visit (HOSPITAL_COMMUNITY)
Admission: RE | Admit: 2020-05-13 | Discharge: 2020-05-13 | Disposition: A | Discharge status: Home / Self Care | Payer: 59 | Source: Ambulatory Visit | Attending: Nephrology | Admitting: Nephrology

## 2020-05-13 VITALS — BP 132/73 | HR 75 | Temp 97.7°F | Resp 18

## 2020-05-13 DIAGNOSIS — D631 Anemia in chronic kidney disease: Secondary | ICD-10-CM | POA: Insufficient documentation

## 2020-05-13 DIAGNOSIS — N183 Chronic kidney disease, stage 3 unspecified: Secondary | ICD-10-CM | POA: Insufficient documentation

## 2020-05-13 LAB — RENAL FUNCTION PANEL
Albumin: 3.4 g/dL — ABNORMAL LOW (ref 3.5–5.0)
Anion gap: 13 (ref 5–15)
BUN: 72 mg/dL — ABNORMAL HIGH (ref 8–23)
CO2: 18 mmol/L — ABNORMAL LOW (ref 22–32)
Calcium: 8.4 mg/dL — ABNORMAL LOW (ref 8.9–10.3)
Chloride: 109 mmol/L (ref 98–111)
Creatinine, Ser: 3.36 mg/dL — ABNORMAL HIGH (ref 0.61–1.24)
GFR, Estimated: 20 mL/min — ABNORMAL LOW (ref >60)
Glucose, Bld: 104 mg/dL — ABNORMAL HIGH (ref 70–99)
Phosphorus: 3.3 mg/dL (ref 2.5–4.6)
Potassium: 4 mmol/L (ref 3.5–5.1)
Sodium: 140 mmol/L (ref 135–145)

## 2020-05-13 LAB — IRON AND TIBC
Iron: 39 ug/dL — ABNORMAL LOW (ref 45–182)
Saturation Ratios: 15 % — ABNORMAL LOW (ref 17.9–39.5)
TIBC: 269 ug/dL (ref 250–450)
UIBC: 230 ug/dL

## 2020-05-13 LAB — FERRITIN: Ferritin: 279 ng/mL (ref 24–336)

## 2020-05-13 MED ORDER — SODIUM CHLORIDE 0.9 % IV SOLN
510.0000 mg | INTRAVENOUS | Status: DC
  Administered 2020-05-13: 510 mg via INTRAVENOUS
  Filled 2020-05-13: qty 17

## 2020-05-13 MED ORDER — EPOETIN ALFA-EPBX 10000 UNIT/ML IJ SOLN: 20000.0000 [IU] | Freq: Once | INTRAMUSCULAR | Status: AC

## 2020-05-13 MED ORDER — EPOETIN ALFA-EPBX 10000 UNIT/ML IJ SOLN
INTRAMUSCULAR | Status: AC
  Administered 2020-05-13: 20000 [IU] via SUBCUTANEOUS
  Filled 2020-05-13: qty 2

## 2020-05-13 MED ORDER — EPOETIN ALFA-EPBX 10000 UNIT/ML IJ SOLN
INTRAMUSCULAR | Status: AC
  Filled 2020-05-13: qty 1

## 2020-05-13 MED ORDER — EPOETIN ALFA-EPBX 10000 UNIT/ML IJ SOLN: 10000.0000 [IU] | INTRAMUSCULAR | Status: DC

## 2020-05-13 NOTE — Progress Notes (Signed)
Hgb 7.7- Reported  to Dr. Adin Hector office, spoke with [employer].

## 2020-05-14 ENCOUNTER — Other Ambulatory Visit: Payer: Self-pay | Admitting: Physician Assistant

## 2020-05-14 LAB — PTH, INTACT AND CALCIUM
Calcium, Total (PTH): 8.2 mg/dL — ABNORMAL LOW (ref 8.6–10.2)
PTH: 39 pg/mL (ref 15–65)

## 2020-05-14 LAB — POCT HEMOGLOBIN-HEMACUE: Hemoglobin: 7.9 g/dL — ABNORMAL LOW (ref 13.0–17.0)

## 2020-05-19 ENCOUNTER — Other Ambulatory Visit: Payer: Self-pay

## 2020-05-19 ENCOUNTER — Encounter (HOSPITAL_COMMUNITY)
Admission: RE | Admit: 2020-05-19 | Discharge: 2020-05-19 | Disposition: A | Discharge status: Home / Self Care | Payer: 59 | Source: Ambulatory Visit | Attending: Nephrology | Admitting: Nephrology

## 2020-05-19 DIAGNOSIS — N183 Chronic kidney disease, stage 3 unspecified: Secondary | ICD-10-CM | POA: Diagnosis not present

## 2020-05-19 MED ORDER — SODIUM CHLORIDE 0.9 % IV SOLN
510.0000 mg | INTRAVENOUS | Status: DC
  Administered 2020-05-19: 510 mg via INTRAVENOUS
  Filled 2020-05-19: qty 17

## 2020-05-28 ENCOUNTER — Other Ambulatory Visit (HOSPITAL_COMMUNITY): Payer: Self-pay | Admitting: Internal Medicine
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
## 2020-06-04 ENCOUNTER — Other Ambulatory Visit: Payer: Self-pay | Admitting: Physician Assistant

## 2020-06-04 NOTE — Telephone Encounter (Signed)
Last Visit: 02/23/2020 Next Visit: 06/16/2020 Labs: 05/13/2020 Creat. 3.36, GFR 20, BUN 72, Glucose 104, Calcium 8.4, Albumin 3.4  Current Dose per office note 03/23/2020: allopurinol 100 mg daily for management of gout. DX: Idiopathic chronic gout of multiple sites without tophus   Last Fill: 02/23/2020  Okay to refill Allopurinol?

## 2020-06-10 ENCOUNTER — Other Ambulatory Visit: Payer: Self-pay

## 2020-06-10 ENCOUNTER — Encounter (HOSPITAL_COMMUNITY)
Admission: RE | Admit: 2020-06-10 | Discharge: 2020-06-10 | Disposition: A | Discharge status: Home / Self Care | Payer: 59 | Source: Ambulatory Visit | Attending: Nephrology | Admitting: Nephrology

## 2020-06-10 VITALS — BP 118/77 | HR 92 | Temp 97.8°F | Resp 18

## 2020-06-10 DIAGNOSIS — N183 Chronic kidney disease, stage 3 unspecified: Secondary | ICD-10-CM | POA: Diagnosis not present

## 2020-06-10 LAB — RENAL FUNCTION PANEL
Albumin: 3.3 g/dL — ABNORMAL LOW (ref 3.5–5.0)
Anion gap: 10 (ref 5–15)
BUN: 57 mg/dL — ABNORMAL HIGH (ref 8–23)
CO2: 20 mmol/L — ABNORMAL LOW (ref 22–32)
Calcium: 7.7 mg/dL — ABNORMAL LOW (ref 8.9–10.3)
Chloride: 111 mmol/L (ref 98–111)
Creatinine, Ser: 3.51 mg/dL — ABNORMAL HIGH (ref 0.61–1.24)
GFR, Estimated: 19 mL/min — ABNORMAL LOW (ref >60)
Glucose, Bld: 64 mg/dL — ABNORMAL LOW (ref 70–99)
Phosphorus: 3.3 mg/dL (ref 2.5–4.6)
Potassium: 4.2 mmol/L (ref 3.5–5.1)
Sodium: 141 mmol/L (ref 135–145)

## 2020-06-10 LAB — IRON AND TIBC
Iron: 39 ug/dL — ABNORMAL LOW (ref 45–182)
Saturation Ratios: 17 % — ABNORMAL LOW (ref 17.9–39.5)
TIBC: 224 ug/dL — ABNORMAL LOW (ref 250–450)
UIBC: 185 ug/dL

## 2020-06-10 LAB — POCT HEMOGLOBIN-HEMACUE: Hemoglobin: 8.6 g/dL — ABNORMAL LOW (ref 13.0–17.0)

## 2020-06-10 MED ORDER — EPOETIN ALFA-EPBX 10000 UNIT/ML IJ SOLN
INTRAMUSCULAR | Status: AC
  Administered 2020-06-10: 20000 [IU] via SUBCUTANEOUS
  Filled 2020-06-10: qty 2

## 2020-06-10 MED ORDER — EPOETIN ALFA-EPBX 10000 UNIT/ML IJ SOLN: 20000.0000 [IU] | INTRAMUSCULAR | Status: DC

## 2020-06-14 ENCOUNTER — Telehealth (HOSPITAL_COMMUNITY): Payer: Self-pay | Admitting: Pharmacist

## 2020-06-14 ENCOUNTER — Ambulatory Visit (HOSPITAL_COMMUNITY): Payer: Self-pay | Admitting: Pharmacist

## 2020-06-14 NOTE — Progress Notes (Signed)
Entered in error

## 2020-06-14 NOTE — Telephone Encounter (Signed)
Advanced Heart Failure Patient Advocate Encounter  Prior Authorization for [employer] has been approved.    PA# [id number] Effective dates: 06/14/20 through 06/14/21  Audry Riles, PharmD, BCPS, BCCP, CPP Heart Failure Clinic Pharmacist [phone]

## 2020-06-14 NOTE — Telephone Encounter (Signed)
Patient Advocate Encounter   Received notification from [employer] that prior authorization for [employer] is required.   PA submitted on CoverMyMeds Key [id number] Status is pending   Will continue to follow.  Audry Riles, PharmD, BCPS, BCCP, CPP Heart Failure Clinic Pharmacist [phone]

## 2020-06-16 ENCOUNTER — Ambulatory Visit: Payer: 59 | Admitting: Rheumatology

## 2020-06-16 DIAGNOSIS — M349 Systemic sclerosis, unspecified: Secondary | ICD-10-CM

## 2020-06-16 DIAGNOSIS — I519 Heart disease, unspecified: Secondary | ICD-10-CM

## 2020-06-16 DIAGNOSIS — I313 Pericardial effusion (noninflammatory): Secondary | ICD-10-CM

## 2020-06-16 DIAGNOSIS — R3121 Asymptomatic microscopic hematuria: Secondary | ICD-10-CM

## 2020-06-16 DIAGNOSIS — Z8709 Personal history of other diseases of the respiratory system: Secondary | ICD-10-CM

## 2020-06-16 DIAGNOSIS — E559 Vitamin D deficiency, unspecified: Secondary | ICD-10-CM

## 2020-06-16 DIAGNOSIS — I272 Pulmonary hypertension, unspecified: Secondary | ICD-10-CM

## 2020-06-16 DIAGNOSIS — Z87448 Personal history of other diseases of urinary system: Secondary | ICD-10-CM

## 2020-06-16 DIAGNOSIS — I7300 Raynaud's syndrome without gangrene: Secondary | ICD-10-CM

## 2020-06-16 DIAGNOSIS — Z8679 Personal history of other diseases of the circulatory system: Secondary | ICD-10-CM

## 2020-06-16 DIAGNOSIS — M1A09X0 Idiopathic chronic gout, multiple sites, without tophus (tophi): Secondary | ICD-10-CM

## 2020-06-16 DIAGNOSIS — R7989 Other specified abnormal findings of blood chemistry: Secondary | ICD-10-CM

## 2020-06-16 DIAGNOSIS — I2510 Atherosclerotic heart disease of native coronary artery without angina pectoris: Secondary | ICD-10-CM

## 2020-06-16 DIAGNOSIS — J90 Pleural effusion, not elsewhere classified: Secondary | ICD-10-CM
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
## 2020-06-18 ENCOUNTER — Encounter: Payer: Self-pay | Admitting: Rheumatology

## 2020-06-18 ENCOUNTER — Ambulatory Visit: Payer: 59 | Admitting: Rheumatology

## 2020-06-18 ENCOUNTER — Other Ambulatory Visit: Payer: Self-pay

## 2020-06-18 VITALS — BP 133/87 | HR 87 | Resp 16 | Ht 69.0 in | Wt 116.0 lb

## 2020-06-18 DIAGNOSIS — I313 Pericardial effusion (noninflammatory): Secondary | ICD-10-CM

## 2020-06-18 DIAGNOSIS — J90 Pleural effusion, not elsewhere classified: Secondary | ICD-10-CM

## 2020-06-18 DIAGNOSIS — Z8709 Personal history of other diseases of the respiratory system: Secondary | ICD-10-CM

## 2020-06-18 DIAGNOSIS — M1A09X0 Idiopathic chronic gout, multiple sites, without tophus (tophi): Secondary | ICD-10-CM | POA: Diagnosis not present

## 2020-06-18 DIAGNOSIS — M349 Systemic sclerosis, unspecified: Secondary | ICD-10-CM

## 2020-06-18 DIAGNOSIS — I272 Pulmonary hypertension, unspecified: Secondary | ICD-10-CM | POA: Diagnosis not present

## 2020-06-18 DIAGNOSIS — R3121 Asymptomatic microscopic hematuria: Secondary | ICD-10-CM

## 2020-06-18 DIAGNOSIS — Z87448 Personal history of other diseases of urinary system: Secondary | ICD-10-CM

## 2020-06-18 DIAGNOSIS — I7300 Raynaud's syndrome without gangrene: Secondary | ICD-10-CM | POA: Diagnosis not present

## 2020-06-18 DIAGNOSIS — Z8679 Personal history of other diseases of the circulatory system: Secondary | ICD-10-CM

## 2020-06-18 DIAGNOSIS — I3139 Other pericardial effusion (noninflammatory): Secondary | ICD-10-CM

## 2020-06-18 DIAGNOSIS — Z9861 Coronary angioplasty status: Secondary | ICD-10-CM

## 2020-06-18 DIAGNOSIS — I519 Heart disease, unspecified: Secondary | ICD-10-CM

## 2020-06-18 DIAGNOSIS — I2510 Atherosclerotic heart disease of native coronary artery without angina pectoris: Secondary | ICD-10-CM

## 2020-06-18 DIAGNOSIS — E559 Vitamin D deficiency, unspecified: Secondary | ICD-10-CM

## 2020-06-18 DIAGNOSIS — K2970 Gastritis, unspecified, without bleeding: Secondary | ICD-10-CM

## 2020-06-18 DIAGNOSIS — K2990 Gastroduodenitis, unspecified, without bleeding: Secondary | ICD-10-CM

## 2020-06-18 DIAGNOSIS — R7989 Other specified abnormal findings of blood chemistry: Secondary | ICD-10-CM

## 2020-06-18 NOTE — Patient Instructions (Signed)
   Please get CBC with diff, CMP with GFR , Uric acid with you next labs.

## 2020-06-30 ENCOUNTER — Other Ambulatory Visit (HOSPITAL_COMMUNITY): Payer: Self-pay | Admitting: *Deleted

## 2020-06-30 ENCOUNTER — Telehealth (HOSPITAL_COMMUNITY): Payer: Self-pay | Admitting: Pharmacist

## 2020-06-30 ENCOUNTER — Telehealth (HOSPITAL_COMMUNITY): Payer: Self-pay | Admitting: Pharmacy Technician

## 2020-06-30 MED ORDER — SILDENAFIL CITRATE 20 MG PO TABS: 40.0000 mg | ORAL_TABLET | Freq: Three times a day (TID) | ORAL | 6 refills | Status: DC

## 2020-06-30 NOTE — Telephone Encounter (Signed)
Received a message from the patient's wife regarding his Sildenafil. She stated that [facility] is charging $1100 for the RX now. Before she was paying about $150. Called and spoke with [facility]. The contract they have with the discount card they were previously using apparently has changed.  Called and spoke with the patient's wife. Went over the [employer] with her at various pharmacies. She wanted [place] to send the RX to [facility] [address]. The co-pay is about $35. Helped her set up a goodrx card. The information was sent to her via text.  Sent Jasmine, ([place]) a request to have Sildenafil sent to [facility].  Charlann Boxer, CPhT

## 2020-06-30 NOTE — Telephone Encounter (Signed)
Patient Advocate Encounter   Received notification from [employer] that prior authorization for sildenafil is required.   PA submitted on CoverMyMeds Key [id number] Status is pending   Will continue to follow.   Audry Riles, PharmD, BCPS, BCCP, CPP Heart Failure Clinic Pharmacist [phone]

## 2020-07-01 ENCOUNTER — Other Ambulatory Visit (HOSPITAL_COMMUNITY): Payer: Self-pay | Admitting: *Deleted

## 2020-07-01 MED ORDER — SILDENAFIL CITRATE 20 MG PO TABS: 40.0000 mg | ORAL_TABLET | Freq: Three times a day (TID) | ORAL | 6 refills | Status: DC

## 2020-07-06 NOTE — Telephone Encounter (Signed)
Advanced Heart Failure Patient Advocate Encounter  Prior Authorization for sildenafil has been approved.    PA# [id number] Effective dates: 06/30/2020 through 06/30/2021  Patients co-pay is $10.00  Audry Riles, PharmD, BCPS, BCCP, CPP Heart Failure Clinic Pharmacist [phone]

## 2020-07-08 ENCOUNTER — Other Ambulatory Visit: Payer: Self-pay

## 2020-07-08 ENCOUNTER — Other Ambulatory Visit (HOSPITAL_COMMUNITY): Payer: Self-pay | Admitting: Internal Medicine

## 2020-07-08 ENCOUNTER — Ambulatory Visit (HOSPITAL_COMMUNITY)
Admission: RE | Admit: 2020-07-08 | Discharge: 2020-07-08 | Disposition: A | Discharge status: Home / Self Care | Payer: 59 | Source: Ambulatory Visit | Attending: Nephrology | Admitting: Nephrology

## 2020-07-08 VITALS — BP 134/85 | HR 68 | Temp 97.6°F | Resp 18

## 2020-07-08 DIAGNOSIS — N183 Chronic kidney disease, stage 3 unspecified: Secondary | ICD-10-CM | POA: Diagnosis not present

## 2020-07-08 LAB — RENAL FUNCTION PANEL
Albumin: 3.5 g/dL (ref 3.5–5.0)
Anion gap: 9 (ref 5–15)
BUN: 54 mg/dL — ABNORMAL HIGH (ref 8–23)
CO2: 20 mmol/L — ABNORMAL LOW (ref 22–32)
Calcium: 7.9 mg/dL — ABNORMAL LOW (ref 8.9–10.3)
Chloride: 108 mmol/L (ref 98–111)
Creatinine, Ser: 3.45 mg/dL — ABNORMAL HIGH (ref 0.61–1.24)
GFR, Estimated: 19 mL/min — ABNORMAL LOW (ref >60)
Glucose, Bld: 84 mg/dL (ref 70–99)
Phosphorus: 4.3 mg/dL (ref 2.5–4.6)
Potassium: 4.3 mmol/L (ref 3.5–5.1)
Sodium: 137 mmol/L (ref 135–145)

## 2020-07-08 LAB — IRON AND TIBC
Iron: 43 ug/dL — ABNORMAL LOW (ref 45–182)
Saturation Ratios: 19 % (ref 17.9–39.5)
TIBC: 223 ug/dL — ABNORMAL LOW (ref 250–450)
UIBC: 180 ug/dL

## 2020-07-08 LAB — POCT HEMOGLOBIN-HEMACUE: Hemoglobin: 9.6 g/dL — ABNORMAL LOW (ref 13.0–17.0)

## 2020-07-08 MED ORDER — EPOETIN ALFA-EPBX 10000 UNIT/ML IJ SOLN: 20000.0000 [IU] | INTRAMUSCULAR | Status: DC

## 2020-07-08 MED ORDER — EPOETIN ALFA-EPBX 10000 UNIT/ML IJ SOLN
INTRAMUSCULAR | Status: AC
  Administered 2020-07-08: 20000 [IU] via SUBCUTANEOUS
  Filled 2020-07-08: qty 2

## 2020-07-19 ENCOUNTER — Other Ambulatory Visit (HOSPITAL_COMMUNITY): Payer: Self-pay

## 2020-07-19 MED ORDER — OPSUMIT 10 MG PO TABS: 10.0000 mg | ORAL_TABLET | Freq: Every day | ORAL | 3 refills | Status: DC

## 2020-08-05 ENCOUNTER — Ambulatory Visit (HOSPITAL_COMMUNITY)
Admission: RE | Admit: 2020-08-05 | Discharge: 2020-08-05 | Disposition: A | Discharge status: Home / Self Care | Payer: 59 | Source: Ambulatory Visit | Attending: Nephrology | Admitting: Nephrology

## 2020-08-05 ENCOUNTER — Other Ambulatory Visit: Payer: Self-pay

## 2020-08-05 VITALS — BP 131/75 | HR 85 | Temp 97.3°F | Resp 18

## 2020-08-05 DIAGNOSIS — N183 Chronic kidney disease, stage 3 unspecified: Secondary | ICD-10-CM | POA: Diagnosis present

## 2020-08-05 LAB — RENAL FUNCTION PANEL
Albumin: 3.3 g/dL — ABNORMAL LOW (ref 3.5–5.0)
Anion gap: 10 (ref 5–15)
BUN: 51 mg/dL — ABNORMAL HIGH (ref 8–23)
CO2: 18 mmol/L — ABNORMAL LOW (ref 22–32)
Calcium: 7.6 mg/dL — ABNORMAL LOW (ref 8.9–10.3)
Chloride: 110 mmol/L (ref 98–111)
Creatinine, Ser: 3.64 mg/dL — ABNORMAL HIGH (ref 0.61–1.24)
GFR, Estimated: 18 mL/min — ABNORMAL LOW (ref >60)
Glucose, Bld: 88 mg/dL (ref 70–99)
Phosphorus: 4.1 mg/dL (ref 2.5–4.6)
Potassium: 3.9 mmol/L (ref 3.5–5.1)
Sodium: 138 mmol/L (ref 135–145)

## 2020-08-05 LAB — POCT HEMOGLOBIN-HEMACUE: Hemoglobin: 9.5 g/dL — ABNORMAL LOW (ref 13.0–17.0)

## 2020-08-05 LAB — IRON AND TIBC
Iron: 38 ug/dL — ABNORMAL LOW (ref 45–182)
Saturation Ratios: 18 % (ref 17.9–39.5)
TIBC: 214 ug/dL — ABNORMAL LOW (ref 250–450)
UIBC: 176 ug/dL

## 2020-08-05 LAB — FERRITIN: Ferritin: 328 ng/mL (ref 24–336)

## 2020-08-05 MED ORDER — EPOETIN ALFA-EPBX 10000 UNIT/ML IJ SOLN
INTRAMUSCULAR | Status: AC
  Filled 2020-08-05: qty 2

## 2020-08-05 MED ORDER — EPOETIN ALFA-EPBX 10000 UNIT/ML IJ SOLN
20000.0000 [IU] | INTRAMUSCULAR | Status: DC
  Administered 2020-08-05: 20000 [IU] via SUBCUTANEOUS

## 2020-08-06 LAB — PTH, INTACT AND CALCIUM
Calcium, Total (PTH): 7.3 mg/dL — ABNORMAL LOW (ref 8.6–10.2)
PTH: 35 pg/mL (ref 15–65)
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
## 2020-08-11 ENCOUNTER — Other Ambulatory Visit: Payer: Self-pay

## 2020-08-11 ENCOUNTER — Encounter (HOSPITAL_COMMUNITY): Payer: Self-pay | Admitting: Internal Medicine

## 2020-08-11 ENCOUNTER — Ambulatory Visit (HOSPITAL_COMMUNITY)
Admission: RE | Admit: 2020-08-11 | Discharge: 2020-08-11 | Disposition: A | Discharge status: Home / Self Care | Payer: 59 | Source: Ambulatory Visit | Attending: Internal Medicine | Admitting: Internal Medicine

## 2020-08-11 ENCOUNTER — Ambulatory Visit (HOSPITAL_BASED_OUTPATIENT_CLINIC_OR_DEPARTMENT_OTHER)
Admission: RE | Admit: 2020-08-11 | Discharge: 2020-08-11 | Disposition: A | Discharge status: Home / Self Care | Payer: 59 | Source: Ambulatory Visit | Attending: Internal Medicine | Admitting: Internal Medicine

## 2020-08-11 VITALS — BP 130/80 | HR 64 | Wt 113.2 lb

## 2020-08-11 DIAGNOSIS — E1122 Type 2 diabetes mellitus with diabetic chronic kidney disease: Secondary | ICD-10-CM | POA: Diagnosis not present

## 2020-08-11 DIAGNOSIS — Z79899 Other long term (current) drug therapy: Secondary | ICD-10-CM | POA: Diagnosis not present

## 2020-08-11 DIAGNOSIS — I2510 Atherosclerotic heart disease of native coronary artery without angina pectoris: Secondary | ICD-10-CM | POA: Diagnosis not present

## 2020-08-11 DIAGNOSIS — I2721 Secondary pulmonary arterial hypertension: Secondary | ICD-10-CM | POA: Diagnosis not present

## 2020-08-11 DIAGNOSIS — I5022 Chronic systolic (congestive) heart failure: Secondary | ICD-10-CM | POA: Insufficient documentation

## 2020-08-11 DIAGNOSIS — D649 Anemia, unspecified: Secondary | ICD-10-CM | POA: Diagnosis not present

## 2020-08-11 DIAGNOSIS — I509 Heart failure, unspecified: Secondary | ICD-10-CM

## 2020-08-11 DIAGNOSIS — I252 Old myocardial infarction: Secondary | ICD-10-CM | POA: Insufficient documentation

## 2020-08-11 DIAGNOSIS — J90 Pleural effusion, not elsewhere classified: Secondary | ICD-10-CM | POA: Insufficient documentation

## 2020-08-11 DIAGNOSIS — I130 Hypertensive heart and chronic kidney disease with heart failure and stage 1 through stage 4 chronic kidney disease, or unspecified chronic kidney disease: Secondary | ICD-10-CM | POA: Diagnosis not present

## 2020-08-11 DIAGNOSIS — N1832 Chronic kidney disease, stage 3b: Secondary | ICD-10-CM | POA: Diagnosis not present

## 2020-08-11 DIAGNOSIS — I313 Pericardial effusion (noninflammatory): Secondary | ICD-10-CM | POA: Diagnosis not present

## 2020-08-11 DIAGNOSIS — M349 Systemic sclerosis, unspecified: Secondary | ICD-10-CM | POA: Insufficient documentation

## 2020-08-11 DIAGNOSIS — Z955 Presence of coronary angioplasty implant and graft: Secondary | ICD-10-CM | POA: Insufficient documentation

## 2020-08-11 DIAGNOSIS — I272 Pulmonary hypertension, unspecified: Secondary | ICD-10-CM | POA: Diagnosis not present

## 2020-08-11 DIAGNOSIS — F17210 Nicotine dependence, cigarettes, uncomplicated: Secondary | ICD-10-CM | POA: Diagnosis not present

## 2020-08-11 DIAGNOSIS — K922 Gastrointestinal hemorrhage, unspecified: Secondary | ICD-10-CM | POA: Insufficient documentation

## 2020-08-11 DIAGNOSIS — I3139 Other pericardial effusion (noninflammatory): Secondary | ICD-10-CM

## 2020-08-11 HISTORY — DX: Heart failure, unspecified: I50.9

## 2020-08-11 NOTE — Patient Instructions (Signed)
Please call our office in September to schedule your follow up appointment  If you have any questions or concerns before your next appointment please send [place] a message through [place] or call our office at [phone].    TO LEAVE A MESSAGE FOR THE NURSE SELECT OPTION 2, PLEASE LEAVE A MESSAGE INCLUDING: . YOUR NAME . DATE OF BIRTH . CALL BACK NUMBER . REASON FOR CALL**this is important as we prioritize the call backs  [place] AS LONG AS YOU CALL BEFORE 4:00 PM  At the [facility], you and your health needs are our priority. As part of our continuing mission to provide you with exceptional heart care, we have created designated Provider Care Teams. These Care Teams include your primary Cardiologist (physician) and Advanced Practice Providers (APPs- Physician Assistants and Nurse Practitioners) who all work together to provide you with the care you need, when you need it.   You may see any of the following providers on your designated Care Team at your next follow up: [name] Dr Glori Bickers . Dr Loralie Champagne . Dr Vickki Muff . Darrick Grinder, NP . Lyda Jester, [place] . Audry Riles, PharmD   Please be sure to bring in all your medications bottles to every appointment.

## 2020-08-11 NOTE — Addendum Note (Signed)
Encounter addended by: Scarlette Calico, RN on: 08/11/2020 11:49 AM  Actions taken: Clinical Note Signed, Charge Capture section accepted

## 2020-08-11 NOTE — Progress Notes (Signed)
  Echocardiogram 2D Echocardiogram has been performed.  [name] 08/11/2020, 11:00 AM

## 2020-08-27 ENCOUNTER — Ambulatory Visit: Payer: 59 | Attending: Internal Medicine

## 2020-08-27 DIAGNOSIS — Z20822 Contact with and (suspected) exposure to covid-19: Secondary | ICD-10-CM

## 2020-08-28 LAB — SARS-COV-2, NAA 2 DAY TAT

## 2020-08-28 LAB — NOVEL CORONAVIRUS, NAA: SARS-CoV-2, NAA: DETECTED — AB

## 2020-08-28 LAB — SPECIMEN STATUS REPORT

## 2020-08-29 ENCOUNTER — Other Ambulatory Visit: Payer: Self-pay | Admitting: Nurse Practitioner

## 2020-08-29 ENCOUNTER — Encounter: Payer: Self-pay | Admitting: Nurse Practitioner

## 2020-08-29 DIAGNOSIS — N184 Chronic kidney disease, stage 4 (severe): Secondary | ICD-10-CM

## 2020-08-29 DIAGNOSIS — I5030 Unspecified diastolic (congestive) heart failure: Secondary | ICD-10-CM | POA: Insufficient documentation

## 2020-08-29 DIAGNOSIS — U071 COVID-19: Secondary | ICD-10-CM

## 2020-08-29 DIAGNOSIS — I5032 Chronic diastolic (congestive) heart failure: Secondary | ICD-10-CM

## 2020-08-29 DIAGNOSIS — I10 Essential (primary) hypertension: Secondary | ICD-10-CM

## 2020-08-29 DIAGNOSIS — I2510 Atherosclerotic heart disease of native coronary artery without angina pectoris: Secondary | ICD-10-CM | POA: Insufficient documentation

## 2020-08-29 NOTE — Progress Notes (Signed)
I connected by phone with [name] on 08/29/2020 at 10:58 AM to discuss the potential use of a new treatment for mild to moderate COVID-19 viral infection in non-hospitalized patients.  This patient is a 60 y.o. male that meets the FDA criteria for Emergency Use Authorization of COVID monoclonal antibody bebtelovimab.  Has a (+) direct SARS-CoV-2 viral test result  Has mild or moderate COVID-19   Is NOT hospitalized due to COVID-19  Is within 10 days of symptom onset  Has at least one of the high risk factor(s) for progression to severe COVID-19 and/or hospitalization as defined in EUA.  Specific high risk criteria : Chronic Kidney Disease (CKD), Cardiovascular disease or hypertension and Chronic Lung Disease   I have spoken and communicated the following to the patient or parent/caregiver regarding COVID monoclonal antibody treatment:  1. FDA has authorized the emergency use for the treatment of mild to moderate COVID-19 in adults and pediatric patients with positive results of direct SARS-CoV-2 viral testing who are 91 years of age and older weighing at least 40 kg, and who are at high risk for progressing to severe COVID-19 and/or hospitalization.  2. The significant known and potential risks and benefits of COVID monoclonal antibody, and the extent to which such potential risks and benefits are unknown.  3. Information on available alternative treatments and the risks and benefits of those alternatives, including clinical trials.  4. Patients treated with COVID monoclonal antibody should continue to self-isolate and use infection control measures (e.g., wear mask, isolate, social distance, avoid sharing personal items, clean and disinfect "high touch" surfaces, and frequent handwashing) according to CDC guidelines.   5. The patient or parent/caregiver has the option to accept or refuse COVID monoclonal antibody treatment.  6. Discussion about the monoclonal antibody infusion  does not ensure treatment. The patient will be placed on a list and scheduled according to risk, symptom onset and availability. A scheduler will reach to the patient to let them know if we can accommodate their infusion or not.  After reviewing this information with the patient, the patient has agreed to receive one of the available covid 19 monoclonal antibodies and will be provided an appropriate fact sheet prior to infusion. [name], NP 08/29/2020 10:58 AM

## 2020-09-01 ENCOUNTER — Telehealth: Payer: Self-pay

## 2020-09-01 ENCOUNTER — Ambulatory Visit (INDEPENDENT_AMBULATORY_CARE_PROVIDER_SITE_OTHER): Payer: 59

## 2020-09-01 ENCOUNTER — Other Ambulatory Visit: Payer: Self-pay

## 2020-09-01 DIAGNOSIS — U071 COVID-19: Secondary | ICD-10-CM | POA: Diagnosis not present

## 2020-09-01 DIAGNOSIS — I10 Essential (primary) hypertension: Secondary | ICD-10-CM

## 2020-09-01 DIAGNOSIS — I5032 Chronic diastolic (congestive) heart failure: Secondary | ICD-10-CM

## 2020-09-01 DIAGNOSIS — N184 Chronic kidney disease, stage 4 (severe): Secondary | ICD-10-CM

## 2020-09-01 MED ORDER — SODIUM CHLORIDE 0.9 % IV SOLN: INTRAVENOUS | Status: DC | PRN

## 2020-09-01 MED ORDER — BEBTELOVIMAB 175 MG/2 ML IV (EUA)
175.0000 mg | Freq: Once | INTRAMUSCULAR | Status: AC
  Administered 2020-09-01: 175 mg via INTRAVENOUS

## 2020-09-01 MED ORDER — METHYLPREDNISOLONE SODIUM SUCC 125 MG IJ SOLR: 125.0000 mg | Freq: Once | INTRAMUSCULAR | Status: AC | PRN

## 2020-09-01 MED ORDER — DIPHENHYDRAMINE HCL 50 MG/ML IJ SOLN: 50.0000 mg | Freq: Once | INTRAMUSCULAR | Status: AC | PRN

## 2020-09-01 MED ORDER — EPINEPHRINE 0.3 MG/0.3ML IJ SOAJ: 0.3000 mg | Freq: Once | INTRAMUSCULAR | Status: AC | PRN

## 2020-09-01 MED ORDER — FAMOTIDINE IN NACL 20-0.9 MG/50ML-% IV SOLN: 20.0000 mg | Freq: Once | INTRAVENOUS | Status: AC | PRN

## 2020-09-01 MED ORDER — ALBUTEROL SULFATE HFA 108 (90 BASE) MCG/ACT IN AERS: 2.0000 | INHALATION_SPRAY | Freq: Once | RESPIRATORY_TRACT | Status: AC | PRN

## 2020-09-01 NOTE — Progress Notes (Signed)
Diagnosis: COVID  Provider:  Marshell Garfinkel, MD  Procedure: Infusion  IV Type: Peripheral, IV Location: L Antecubital  Bebtelovimab, Dose: 175 mg  Infusion Start Time: 7921  Infusion Stop Time: 7837  Post Infusion IV Care: Observation period completed and Peripheral IV Discontinued  Discharge: Condition: Good, Destination: Home . AVS provided to patient.   Performed by:  Janine Ores, RN

## 2020-09-01 NOTE — Telephone Encounter (Signed)
Reviewed MAB Estimate of $1050 with patient.  Stated they would like to proceed.

## 2020-09-01 NOTE — Patient Instructions (Signed)
10 Things You Can Do to Manage Your COVID-19 Symptoms at Home If you have possible or confirmed COVID-19: 1. Stay home except to get medical care. 2. Monitor your symptoms carefully. If your symptoms get worse, call your healthcare provider immediately. 3. Get rest and stay hydrated. 4. If you have a medical appointment, call the healthcare provider ahead of time and tell them that you have or may have COVID-19. 5. For medical emergencies, call 911 and notify the dispatch personnel that you have or may have COVID-19. 6. Cover your cough and sneezes with a tissue or use the inside of your elbow. 7. Wash your hands often with soap and water for at least 20 seconds or clean your hands with an alcohol-based hand sanitizer that contains at least 60% alcohol. 8. As much as possible, stay in a specific room and away from other people in your home. Also, you should use a separate bathroom, if available. If you need to be around other people in or outside of the home, wear a mask. 9. Avoid sharing personal items with other people in your household, like dishes, towels, and bedding. 10. Clean all surfaces that are touched often, like counters, tabletops, and doorknobs. Use household cleaning sprays or wipes according to the label instructions. [email or web address] 11/07/2019 This information is not intended to replace advice given to you by your health care provider. Make sure you discuss any questions you have with your health care provider. Document Revised: 02/23/2020 Document Reviewed: 02/23/2020 Elsevier Patient Education  2021 [place].  What types of side effects do monoclonal antibody drugs cause?  Common side effects  In general, the more common side effects caused by monoclonal antibody drugs include: . Allergic reactions, such as hives or itching . Flu-like signs and symptoms, including chills, fatigue, fever, and muscle aches and pains . Nausea, vomiting . Diarrhea . Skin  rashes . Low blood pressure   The CDC is recommending patients who receive monoclonal antibody treatments wait at least 90 days before being vaccinated.  Currently, there are no data on the safety and efficacy of mRNA COVID-19 vaccines in persons who received monoclonal antibodies or convalescent plasma as part of COVID-19 treatment. Based on the estimated half-life of such therapies as well as evidence suggesting that reinfection is uncommon in the 90 days after initial infection, vaccination should be deferred for at least 90 days, as a precautionary measure until additional information becomes available, to avoid interference of the antibody treatment with vaccine-induced immune responses.  If someone you know is interested in receiving treatment please have them contact their MD for a referral or visit [email or web address]

## 2020-09-02 ENCOUNTER — Other Ambulatory Visit: Payer: Self-pay | Admitting: Physician Assistant

## 2020-09-02 ENCOUNTER — Inpatient Hospital Stay (HOSPITAL_COMMUNITY): Admission: RE | Admit: 2020-09-02 | Payer: 59 | Source: Ambulatory Visit

## 2020-09-02 NOTE — Telephone Encounter (Signed)
Next Visit: 10/19/2020  Last Visit: 06/18/2020  Last Fill: 06/07/2020  DX: Idiopathic chronic gout of multiple sites without tophus   Current Dose per office note 06/18/2020, allopurinol 100 mg daily for management of gout  Labs: 05/13/2020 Creat. 3.36, GFR 20, BUN 72, Glucose 104, Calcium 8.4, Albumin 3.4   Okay to refill allopurinol?

## 2020-09-27 ENCOUNTER — Ambulatory Visit (HOSPITAL_COMMUNITY)
Admission: RE | Admit: 2020-09-27 | Discharge: 2020-09-27 | Disposition: A | Discharge status: Home / Self Care | Payer: 59 | Source: Ambulatory Visit | Attending: Nephrology | Admitting: Nephrology

## 2020-09-27 ENCOUNTER — Other Ambulatory Visit: Payer: Self-pay

## 2020-09-27 VITALS — BP 130/78 | HR 77 | Temp 97.5°F | Resp 18

## 2020-09-27 DIAGNOSIS — N183 Chronic kidney disease, stage 3 unspecified: Secondary | ICD-10-CM | POA: Insufficient documentation

## 2020-09-27 LAB — RENAL FUNCTION PANEL
Albumin: 3 g/dL — ABNORMAL LOW (ref 3.5–5.0)
Anion gap: 10 (ref 5–15)
BUN: 61 mg/dL — ABNORMAL HIGH (ref 8–23)
CO2: 19 mmol/L — ABNORMAL LOW (ref 22–32)
Calcium: 7.9 mg/dL — ABNORMAL LOW (ref 8.9–10.3)
Chloride: 111 mmol/L (ref 98–111)
Creatinine, Ser: 3.04 mg/dL — ABNORMAL HIGH (ref 0.61–1.24)
GFR, Estimated: 23 mL/min — ABNORMAL LOW (ref >60)
Glucose, Bld: 133 mg/dL — ABNORMAL HIGH (ref 70–99)
Phosphorus: 3.8 mg/dL (ref 2.5–4.6)
Potassium: 3.8 mmol/L (ref 3.5–5.1)
Sodium: 140 mmol/L (ref 135–145)

## 2020-09-27 LAB — IRON AND TIBC
Iron: 49 ug/dL (ref 45–182)
Saturation Ratios: 22 % (ref 17.9–39.5)
TIBC: 225 ug/dL — ABNORMAL LOW (ref 250–450)
UIBC: 176 ug/dL

## 2020-09-27 LAB — POCT HEMOGLOBIN-HEMACUE: Hemoglobin: 8.7 g/dL — ABNORMAL LOW (ref 13.0–17.0)

## 2020-09-27 MED ORDER — EPOETIN ALFA-EPBX 10000 UNIT/ML IJ SOLN: 20000.0000 [IU] | INTRAMUSCULAR | Status: DC

## 2020-09-27 MED ORDER — EPOETIN ALFA-EPBX 10000 UNIT/ML IJ SOLN
INTRAMUSCULAR | Status: AC
  Administered 2020-09-27: 20000 [IU] via SUBCUTANEOUS
  Filled 2020-09-27: qty 2

## 2020-09-30 ENCOUNTER — Encounter (HOSPITAL_COMMUNITY): Payer: 59

## 2020-10-01 ENCOUNTER — Other Ambulatory Visit: Payer: Self-pay

## 2020-10-01 ENCOUNTER — Ambulatory Visit: Payer: Self-pay

## 2020-10-01 ENCOUNTER — Telehealth: Payer: Self-pay | Admitting: *Deleted

## 2020-10-01 ENCOUNTER — Encounter: Payer: Self-pay | Admitting: Physician Assistant

## 2020-10-01 ENCOUNTER — Ambulatory Visit: Payer: 59 | Admitting: Physician Assistant

## 2020-10-01 VITALS — BP 142/79 | HR 76 | Resp 16 | Ht 69.0 in | Wt 110.0 lb

## 2020-10-01 DIAGNOSIS — I3139 Other pericardial effusion (noninflammatory): Secondary | ICD-10-CM

## 2020-10-01 DIAGNOSIS — Z9861 Coronary angioplasty status: Secondary | ICD-10-CM

## 2020-10-01 DIAGNOSIS — M542 Cervicalgia: Secondary | ICD-10-CM

## 2020-10-01 DIAGNOSIS — M62838 Other muscle spasm: Secondary | ICD-10-CM

## 2020-10-01 DIAGNOSIS — I519 Heart disease, unspecified: Secondary | ICD-10-CM

## 2020-10-01 DIAGNOSIS — K2990 Gastroduodenitis, unspecified, without bleeding: Secondary | ICD-10-CM

## 2020-10-01 DIAGNOSIS — M1A09X0 Idiopathic chronic gout, multiple sites, without tophus (tophi): Secondary | ICD-10-CM

## 2020-10-01 DIAGNOSIS — I7300 Raynaud's syndrome without gangrene: Secondary | ICD-10-CM | POA: Diagnosis not present

## 2020-10-01 DIAGNOSIS — Z8709 Personal history of other diseases of the respiratory system: Secondary | ICD-10-CM

## 2020-10-01 DIAGNOSIS — K2970 Gastritis, unspecified, without bleeding: Secondary | ICD-10-CM

## 2020-10-01 DIAGNOSIS — I272 Pulmonary hypertension, unspecified: Secondary | ICD-10-CM

## 2020-10-01 DIAGNOSIS — I2510 Atherosclerotic heart disease of native coronary artery without angina pectoris: Secondary | ICD-10-CM

## 2020-10-01 DIAGNOSIS — R7989 Other specified abnormal findings of blood chemistry: Secondary | ICD-10-CM

## 2020-10-01 DIAGNOSIS — Z8679 Personal history of other diseases of the circulatory system: Secondary | ICD-10-CM

## 2020-10-01 DIAGNOSIS — J90 Pleural effusion, not elsewhere classified: Secondary | ICD-10-CM

## 2020-10-01 DIAGNOSIS — M349 Systemic sclerosis, unspecified: Secondary | ICD-10-CM | POA: Diagnosis not present

## 2020-10-01 DIAGNOSIS — R3121 Asymptomatic microscopic hematuria: Secondary | ICD-10-CM

## 2020-10-01 DIAGNOSIS — I313 Pericardial effusion (noninflammatory): Secondary | ICD-10-CM

## 2020-10-01 DIAGNOSIS — E559 Vitamin D deficiency, unspecified: Secondary | ICD-10-CM

## 2020-10-01 DIAGNOSIS — Z87448 Personal history of other diseases of urinary system: Secondary | ICD-10-CM

## 2020-10-01 MED ORDER — METHOCARBAMOL 500 MG PO TABS: 500.0000 mg | ORAL_TABLET | Freq: Two times a day (BID) | ORAL | 0 refills | Status: DC | PRN

## 2020-10-01 NOTE — Telephone Encounter (Signed)
Patient's wife contacted the office stating [name] woke up with a stiff neck. Patient has not tired anything to relieve the pain. Patient is not having any other symptoms. Patient requesting an appointment to have evaluated. Patient scheduled for 10/01/2020 at 11:00 am.

## 2020-10-01 NOTE — Patient Instructions (Signed)
Neck Exercises Ask your health care provider which exercises are safe for you. Do exercises exactly as told by your health care provider and adjust them as directed. It is normal to feel mild stretching, pulling, tightness, or discomfort as you do these exercises. Stop right away if you feel sudden pain or your pain gets worse. Do not begin these exercises until told by your health care provider. Neck exercises can be important for many reasons. They can improve strength and maintain flexibility in your neck, which will help your upper back and preventneck pain. Stretching exercises Rotation neck stretching  Sit in a chair or stand up. Place your feet flat on the floor, shoulder width apart. Slowly turn your head (rotate) to the right until a slight stretch is felt. Turn it all the way to the right so you can look over your right shoulder. Do not tilt or tip your head. Hold this position for 10-30 seconds. Slowly turn your head (rotate) to the left until a slight stretch is felt. Turn it all the way to the left so you can look over your left shoulder. Do not tilt or tip your head. Hold this position for 10-30 seconds. Repeat __________ times. Complete this exercise __________ times a day. Neck retraction Sit in a sturdy chair or stand up. Look straight ahead. Do not bend your neck. Use your fingers to push your chin backward (retraction). Do not bend your neck for this movement. Continue to face straight ahead. If you are doing the exercise properly, you will feel a slight sensation in your throat and a stretch at the back of your neck. Hold the stretch for 1-2 seconds. Repeat __________ times. Complete this exercise __________ times a day. Strengthening exercises Neck press Lie on your back on a firm bed or on the floor with a pillow under your head. Use your neck muscles to push your head down on the pillow and straighten your spine. Hold the position as well as you can. Keep your head  facing up (in a neutral position) and your chin tucked. Slowly count to 5 while holding this position. Repeat __________ times. Complete this exercise __________ times a day. Isometrics These are exercises in which you strengthen the muscles in your neck while keeping your neck still (isometrics). Sit in a supportive chair and place your hand on your forehead. Keep your head and face facing straight ahead. Do not flex or extend your neck while doing isometrics. Push forward with your head and neck while pushing back with your hand. Hold for 10 seconds. Do the sequence again, this time putting your hand against the back of your head. Use your head and neck to push backward against the hand pressure. Finally, do the same exercise on either side of your head, pushing sideways against the pressure of your hand. Repeat __________ times. Complete this exercise __________ times a day. Prone head lifts Lie face-down (prone position), resting on your elbows so that your chest and upper back are raised. Start with your head facing downward, near your chest. Position your chin either on or near your chest. Slowly lift your head upward. Lift until you are looking straight ahead. Then continue lifting your head as far back as you can comfortably stretch. Hold your head up for 5 seconds. Then slowly lower it to your starting position. Repeat __________ times. Complete this exercise __________ times a day. Supine head lifts Lie on your back (supine position), bending your knees to point to the  ceiling and keeping your feet flat on the floor. Lift your head slowly off the floor, raising your chin toward your chest. Hold for 5 seconds. Repeat __________ times. Complete this exercise __________ times a day. Scapular retraction Stand with your arms at your sides. Look straight ahead. Slowly pull both shoulders (scapulae) backward and downward (retraction) until you feel a stretch between your shoulder blades in  your upper back. Hold for 10-30 seconds. Relax and repeat. Repeat __________ times. Complete this exercise __________ times a day. Contact a health care provider if: Your neck pain or discomfort gets much worse when you do an exercise. Your neck pain or discomfort does not improve within 2 hours after you exercise. If you have any of these problems, stop exercising right away. Do not do the exercises again unless your health care provider says that you can. Get help right away if: You develop sudden, severe neck pain. If this happens, stop exercising right away. Do not do the exercises again unless your health care provider says that you can. This information is not intended to replace advice given to you by your health care provider. Make sure you discuss any questions you have with your healthcare provider. Document Revised: 02/06/2018 Document Reviewed: 02/06/2018 Elsevier Patient Education  2022 [employer].
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
## 2020-10-02 ENCOUNTER — Other Ambulatory Visit (HOSPITAL_COMMUNITY): Payer: Self-pay | Admitting: Internal Medicine

## 2020-10-02 ENCOUNTER — Other Ambulatory Visit: Payer: Self-pay | Admitting: Physician Assistant

## 2020-10-05 NOTE — Progress Notes (Signed)
Diagnosis: COVID  Provider:  Marshell Garfinkel, MD  Procedure: Infusion  IV Type: Peripheral, IV Location: L Antecubital  Bebtelovimab, Dose: 175 mg  Infusion Start Time: 4707  Infusion Stop Time: 6151  Post Infusion IV Care: Observation period completed and Peripheral IV Discontinued  Discharge: Condition: Good, Destination: Home . AVS provided to patient.   Performed by:  Janine Ores RN
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
## 2020-10-19 ENCOUNTER — Ambulatory Visit: Payer: 59 | Admitting: Physician Assistant

## 2020-10-26 ENCOUNTER — Encounter (HOSPITAL_COMMUNITY)
Admission: RE | Admit: 2020-10-26 | Discharge: 2020-10-26 | Disposition: A | Discharge status: Home / Self Care | Payer: 59 | Source: Ambulatory Visit | Attending: Nephrology | Admitting: Nephrology

## 2020-10-26 ENCOUNTER — Other Ambulatory Visit: Payer: Self-pay

## 2020-10-26 VITALS — BP 143/82 | HR 78 | Temp 97.8°F | Resp 18

## 2020-10-26 DIAGNOSIS — N183 Chronic kidney disease, stage 3 unspecified: Secondary | ICD-10-CM | POA: Diagnosis not present

## 2020-10-26 LAB — RENAL FUNCTION PANEL
Albumin: 3.3 g/dL — ABNORMAL LOW (ref 3.5–5.0)
Anion gap: 7 (ref 5–15)
BUN: 67 mg/dL — ABNORMAL HIGH (ref 8–23)
CO2: 21 mmol/L — ABNORMAL LOW (ref 22–32)
Calcium: 8.1 mg/dL — ABNORMAL LOW (ref 8.9–10.3)
Chloride: 112 mmol/L — ABNORMAL HIGH (ref 98–111)
Creatinine, Ser: 3.46 mg/dL — ABNORMAL HIGH (ref 0.61–1.24)
GFR, Estimated: 19 mL/min — ABNORMAL LOW (ref >60)
Glucose, Bld: 97 mg/dL (ref 70–99)
Phosphorus: 4.6 mg/dL (ref 2.5–4.6)
Potassium: 4.7 mmol/L (ref 3.5–5.1)
Sodium: 140 mmol/L (ref 135–145)

## 2020-10-26 LAB — FERRITIN: Ferritin: 299 ng/mL (ref 24–336)

## 2020-10-26 LAB — IRON AND TIBC
Iron: 74 ug/dL (ref 45–182)
Saturation Ratios: 30 % (ref 17.9–39.5)
TIBC: 251 ug/dL (ref 250–450)
UIBC: 177 ug/dL

## 2020-10-26 LAB — POCT HEMOGLOBIN-HEMACUE: Hemoglobin: 9.6 g/dL — ABNORMAL LOW (ref 13.0–17.0)

## 2020-10-26 MED ORDER — EPOETIN ALFA-EPBX 10000 UNIT/ML IJ SOLN: 20000.0000 [IU] | INTRAMUSCULAR | Status: DC

## 2020-10-26 MED ORDER — EPOETIN ALFA-EPBX 10000 UNIT/ML IJ SOLN
INTRAMUSCULAR | Status: AC
  Administered 2020-10-26: 20000 [IU] via SUBCUTANEOUS
  Filled 2020-10-26: qty 2

## 2020-10-27 ENCOUNTER — Telehealth: Payer: Self-pay | Admitting: Physician Assistant

## 2020-10-27 ENCOUNTER — Ambulatory Visit: Payer: 59 | Admitting: Physician Assistant

## 2020-10-27 ENCOUNTER — Encounter: Payer: Self-pay | Admitting: Physician Assistant

## 2020-10-27 VITALS — BP 125/83 | HR 77 | Resp 15 | Ht 69.5 in | Wt 109.4 lb

## 2020-10-27 DIAGNOSIS — J90 Pleural effusion, not elsewhere classified: Secondary | ICD-10-CM

## 2020-10-27 DIAGNOSIS — E43 Unspecified severe protein-calorie malnutrition: Secondary | ICD-10-CM

## 2020-10-27 DIAGNOSIS — Z8709 Personal history of other diseases of the respiratory system: Secondary | ICD-10-CM

## 2020-10-27 DIAGNOSIS — N2889 Other specified disorders of kidney and ureter: Secondary | ICD-10-CM

## 2020-10-27 DIAGNOSIS — R7989 Other specified abnormal findings of blood chemistry: Secondary | ICD-10-CM

## 2020-10-27 DIAGNOSIS — M1A09X0 Idiopathic chronic gout, multiple sites, without tophus (tophi): Secondary | ICD-10-CM | POA: Diagnosis not present

## 2020-10-27 DIAGNOSIS — I10 Essential (primary) hypertension: Secondary | ICD-10-CM

## 2020-10-27 DIAGNOSIS — I7300 Raynaud's syndrome without gangrene: Secondary | ICD-10-CM | POA: Diagnosis not present

## 2020-10-27 DIAGNOSIS — Z8774 Personal history of (corrected) congenital malformations of heart and circulatory system: Secondary | ICD-10-CM

## 2020-10-27 DIAGNOSIS — D123 Benign neoplasm of transverse colon: Secondary | ICD-10-CM

## 2020-10-27 DIAGNOSIS — A419 Sepsis, unspecified organism: Secondary | ICD-10-CM

## 2020-10-27 DIAGNOSIS — M62838 Other muscle spasm: Secondary | ICD-10-CM

## 2020-10-27 DIAGNOSIS — Z9861 Coronary angioplasty status: Secondary | ICD-10-CM

## 2020-10-27 DIAGNOSIS — K56609 Unspecified intestinal obstruction, unspecified as to partial versus complete obstruction: Secondary | ICD-10-CM

## 2020-10-27 DIAGNOSIS — I272 Pulmonary hypertension, unspecified: Secondary | ICD-10-CM

## 2020-10-27 DIAGNOSIS — M349 Systemic sclerosis, unspecified: Secondary | ICD-10-CM | POA: Diagnosis not present

## 2020-10-27 DIAGNOSIS — R3121 Asymptomatic microscopic hematuria: Secondary | ICD-10-CM

## 2020-10-27 DIAGNOSIS — I959 Hypotension, unspecified: Secondary | ICD-10-CM

## 2020-10-27 DIAGNOSIS — N183 Chronic kidney disease, stage 3 unspecified: Secondary | ICD-10-CM

## 2020-10-27 DIAGNOSIS — K2970 Gastritis, unspecified, without bleeding: Secondary | ICD-10-CM

## 2020-10-27 DIAGNOSIS — I313 Pericardial effusion (noninflammatory): Secondary | ICD-10-CM

## 2020-10-27 DIAGNOSIS — D649 Anemia, unspecified: Secondary | ICD-10-CM

## 2020-10-27 DIAGNOSIS — E559 Vitamin D deficiency, unspecified: Secondary | ICD-10-CM

## 2020-10-27 DIAGNOSIS — K2990 Gastroduodenitis, unspecified, without bleeding: Secondary | ICD-10-CM

## 2020-10-27 DIAGNOSIS — I2510 Atherosclerotic heart disease of native coronary artery without angina pectoris: Secondary | ICD-10-CM

## 2020-10-27 DIAGNOSIS — I519 Heart disease, unspecified: Secondary | ICD-10-CM

## 2020-10-27 DIAGNOSIS — I3139 Other pericardial effusion (noninflammatory): Secondary | ICD-10-CM

## 2020-10-27 LAB — PTH, INTACT AND CALCIUM
Calcium, Total (PTH): 7.8 mg/dL — ABNORMAL LOW (ref 8.6–10.2)
PTH: 56 pg/mL (ref 15–65)

## 2020-10-27 NOTE — Telephone Encounter (Signed)
I called the patient and spoke to his wife regarding scheduling appointment at [facility] pulmonary.  He will likely require a high-resolution chest CT as well as PFTs due to his history of scleroderma.  She is unsure if it has been longer than 3 years since he has seen a pulmonologist so a new referral to Dr. Chase Caller will be placed today.  Hazel Sams, PA-C

## 2020-10-28 NOTE — Telephone Encounter (Signed)
Referral placed.

## 2020-11-01 ENCOUNTER — Other Ambulatory Visit: Payer: Self-pay | Admitting: Physician Assistant

## 2020-11-01 NOTE — Telephone Encounter (Signed)
Next Visit: 03/02/2021  Last Visit: 10/27/2020  Last Fill: 09/02/2020  DX: Idiopathic chronic gout of multiple sites without tophus  Current Dose per office note 10/27/2020: allopurinol 100 mg daily  Labs: 10/26/2020 Chloride, 112, CO2 21, BUN 67, Creatinine 3.46 GFR 19, Calcium 8.1, Albumin 3.3, Hgb 9.6  Okay to refill Allopurinol?

## 2020-11-04 ENCOUNTER — Encounter: Payer: Self-pay | Admitting: Internal Medicine

## 2020-11-23 ENCOUNTER — Other Ambulatory Visit: Payer: Self-pay

## 2020-11-23 ENCOUNTER — Encounter (HOSPITAL_COMMUNITY)
Admission: RE | Admit: 2020-11-23 | Discharge: 2020-11-23 | Disposition: A | Discharge status: Home / Self Care | Payer: Medicare Other | Source: Ambulatory Visit | Attending: Nephrology | Admitting: Nephrology

## 2020-11-23 VITALS — BP 139/85 | HR 85 | Temp 97.7°F | Resp 18

## 2020-11-23 DIAGNOSIS — N183 Chronic kidney disease, stage 3 unspecified: Secondary | ICD-10-CM | POA: Diagnosis not present

## 2020-11-23 DIAGNOSIS — D631 Anemia in chronic kidney disease: Secondary | ICD-10-CM | POA: Diagnosis not present

## 2020-11-23 LAB — RENAL FUNCTION PANEL
Albumin: 3.1 g/dL — ABNORMAL LOW (ref 3.5–5.0)
Anion gap: 11 (ref 5–15)
BUN: 60 mg/dL — ABNORMAL HIGH (ref 8–23)
CO2: 18 mmol/L — ABNORMAL LOW (ref 22–32)
Calcium: 7.8 mg/dL — ABNORMAL LOW (ref 8.9–10.3)
Chloride: 110 mmol/L (ref 98–111)
Creatinine, Ser: 3 mg/dL — ABNORMAL HIGH (ref 0.61–1.24)
GFR, Estimated: 23 mL/min — ABNORMAL LOW (ref >60)
Glucose, Bld: 102 mg/dL — ABNORMAL HIGH (ref 70–99)
Phosphorus: 3.7 mg/dL (ref 2.5–4.6)
Potassium: 3.8 mmol/L (ref 3.5–5.1)
Sodium: 139 mmol/L (ref 135–145)

## 2020-11-23 LAB — IRON AND TIBC
Iron: 74 ug/dL (ref 45–182)
Saturation Ratios: 29 % (ref 17.9–39.5)
TIBC: 252 ug/dL (ref 250–450)
UIBC: 178 ug/dL

## 2020-11-23 MED ORDER — EPOETIN ALFA-EPBX 10000 UNIT/ML IJ SOLN
INTRAMUSCULAR | Status: AC
  Administered 2020-11-23: 20000 [IU]
  Filled 2020-11-23: qty 2

## 2020-11-23 MED ORDER — EPOETIN ALFA-EPBX 10000 UNIT/ML IJ SOLN: 20000.0000 [IU] | INTRAMUSCULAR | Status: DC

## 2020-11-24 LAB — POCT HEMOGLOBIN-HEMACUE: Hemoglobin: 10.6 g/dL — ABNORMAL LOW (ref 13.0–17.0)

## 2020-12-15 ENCOUNTER — Other Ambulatory Visit (HOSPITAL_COMMUNITY): Payer: Self-pay | Admitting: *Deleted

## 2020-12-15 MED ORDER — POTASSIUM CHLORIDE CRYS ER 20 MEQ PO TBCR: 20.0000 meq | EXTENDED_RELEASE_TABLET | Freq: Two times a day (BID) | ORAL | 1 refills | Status: DC

## 2020-12-16 ENCOUNTER — Telehealth: Payer: Self-pay

## 2020-12-16 DIAGNOSIS — M62838 Other muscle spasm: Secondary | ICD-10-CM

## 2020-12-16 DIAGNOSIS — M542 Cervicalgia: Secondary | ICD-10-CM

## 2020-12-16 NOTE — Telephone Encounter (Signed)
Patient's wife [name] called stating [place] has been having a lot of neck pain.  She states the muscle relaxer medication only works for a a little while and then the pain returns.  She states "something else needs to be done to help his neck pain" and requested a return call to schedule an appointment today or tomorrow.

## 2020-12-16 NOTE — Telephone Encounter (Signed)
Discussed with Dr. Estanislado Pandy. Please refer to spinal specialist.    His treatment options are limited due to being unable to take NSAIDs (CKD) or high dose steroids (due to scleroderma).

## 2020-12-16 NOTE — Telephone Encounter (Signed)
We saw patient 10/01/2020 for his neck pain.Neck pain went away and now it now come once a week. Activity such as mowing grass causes the pain in his neck. He is using Salonpas patch and methocarbamol 500 mg twice daily as needed. Patent states is take several days for his neck to loosen up where he can move it again. Patient would like to know what else can be done for this. Please advise.

## 2020-12-16 NOTE — Telephone Encounter (Signed)
[name], Xayden's wife, advised Discussed with Dr. Estanislado Pandy. His treatment options are limited due to being unable to take NSAIDs (CKD) or high dose steroids (due to scleroderma).   Referral placed for spinal specialist .

## 2020-12-21 ENCOUNTER — Other Ambulatory Visit: Payer: Self-pay

## 2020-12-21 ENCOUNTER — Encounter (HOSPITAL_COMMUNITY)
Admission: RE | Admit: 2020-12-21 | Discharge: 2020-12-21 | Disposition: A | Discharge status: Home / Self Care | Payer: Medicare Other | Source: Ambulatory Visit | Attending: Nephrology | Admitting: Nephrology

## 2020-12-21 VITALS — BP 133/80 | HR 80 | Temp 98.0°F | Resp 17

## 2020-12-21 DIAGNOSIS — N183 Chronic kidney disease, stage 3 unspecified: Secondary | ICD-10-CM

## 2020-12-21 LAB — RENAL FUNCTION PANEL
Albumin: 2.9 g/dL — ABNORMAL LOW (ref 3.5–5.0)
Anion gap: 7 (ref 5–15)
BUN: 46 mg/dL — ABNORMAL HIGH (ref 8–23)
CO2: 20 mmol/L — ABNORMAL LOW (ref 22–32)
Calcium: 8.1 mg/dL — ABNORMAL LOW (ref 8.9–10.3)
Chloride: 113 mmol/L — ABNORMAL HIGH (ref 98–111)
Creatinine, Ser: 3.05 mg/dL — ABNORMAL HIGH (ref 0.61–1.24)
GFR, Estimated: 22 mL/min — ABNORMAL LOW (ref >60)
Glucose, Bld: 144 mg/dL — ABNORMAL HIGH (ref 70–99)
Phosphorus: 2.7 mg/dL (ref 2.5–4.6)
Potassium: 3.8 mmol/L (ref 3.5–5.1)
Sodium: 140 mmol/L (ref 135–145)

## 2020-12-21 LAB — IRON AND TIBC
Iron: 52 ug/dL (ref 45–182)
Saturation Ratios: 23 % (ref 17.9–39.5)
TIBC: 225 ug/dL — ABNORMAL LOW (ref 250–450)
UIBC: 173 ug/dL

## 2020-12-21 MED ORDER — EPOETIN ALFA-EPBX 10000 UNIT/ML IJ SOLN
20000.0000 [IU] | INTRAMUSCULAR | Status: DC
  Administered 2020-12-21: 20000 [IU] via SUBCUTANEOUS

## 2020-12-21 MED ORDER — EPOETIN ALFA-EPBX 10000 UNIT/ML IJ SOLN
INTRAMUSCULAR | Status: AC
  Filled 2020-12-21: qty 2

## 2020-12-22 LAB — POCT HEMOGLOBIN-HEMACUE: Hemoglobin: 9.4 g/dL — ABNORMAL LOW (ref 13.0–17.0)

## 2020-12-23 ENCOUNTER — Ambulatory Visit (INDEPENDENT_AMBULATORY_CARE_PROVIDER_SITE_OTHER): Payer: 59 | Admitting: Internal Medicine

## 2020-12-23 ENCOUNTER — Other Ambulatory Visit: Payer: Self-pay

## 2020-12-23 ENCOUNTER — Encounter: Payer: Self-pay | Admitting: Internal Medicine

## 2020-12-23 VITALS — BP 128/80 | HR 83 | Temp 97.9°F | Ht 68.0 in | Wt 112.0 lb

## 2020-12-23 DIAGNOSIS — R0600 Dyspnea, unspecified: Secondary | ICD-10-CM

## 2020-12-23 DIAGNOSIS — M349 Systemic sclerosis, unspecified: Secondary | ICD-10-CM

## 2020-12-23 DIAGNOSIS — I2721 Secondary pulmonary arterial hypertension: Secondary | ICD-10-CM | POA: Diagnosis not present

## 2020-12-23 DIAGNOSIS — R0609 Other forms of dyspnea: Secondary | ICD-10-CM
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
## 2020-12-23 NOTE — Patient Instructions (Signed)
ICD-10-CM   1. Dyspnea on exertion  R06.00     2. WHO group 1 pulmonary arterial hypertension (HCC)  I27.21     3. Scleroderma ([place])  M34.9       Do full PFT Do HRCT supine and prone  Followup  - video visit wiuth APP or DR Tyne Banta next few weeks to several weeks to discuss results';15 min slot  - if no ILD,then annual PFT fine  - if ILD  >10% + -> then discuss anti-fibrotics

## 2021-01-01 ENCOUNTER — Telehealth: Payer: Self-pay | Admitting: Student in an Organized Health Care Education/Training Program

## 2021-01-01 ENCOUNTER — Other Ambulatory Visit (HOSPITAL_COMMUNITY): Payer: Self-pay | Admitting: Internal Medicine

## 2021-01-01 DIAGNOSIS — I272 Pulmonary hypertension, unspecified: Secondary | ICD-10-CM

## 2021-01-01 MED ORDER — SILDENAFIL CITRATE 20 MG PO TABS: 40.0000 mg | ORAL_TABLET | Freq: Three times a day (TID) | ORAL | 11 refills | Status: DC

## 2021-01-01 NOTE — Telephone Encounter (Signed)
Spoke with wife who had tried to transfer revatio from [facility] to [place] for cost issues. Ran into problems with this. D/c old prescription. Sent new prescription to [facility] with 1 year refills.

## 2021-01-02 ENCOUNTER — Other Ambulatory Visit: Payer: Self-pay | Admitting: Physician Assistant

## 2021-01-02 DIAGNOSIS — I272 Pulmonary hypertension, unspecified: Secondary | ICD-10-CM

## 2021-01-02 MED ORDER — SILDENAFIL CITRATE 20 MG PO TABS: 40.0000 mg | ORAL_TABLET | Freq: Three times a day (TID) | ORAL | 11 refills | Status: DC

## 2021-01-02 NOTE — Progress Notes (Signed)
Pt called stating he did not receive his sildenafil last evening. I called and gave a verbal to the [place] listed on the chart. Verbal received by PharmD.   I attempted to call the patient back, no answer. Left a VM.

## 2021-01-03 ENCOUNTER — Other Ambulatory Visit: Payer: Self-pay

## 2021-01-03 DIAGNOSIS — I272 Pulmonary hypertension, unspecified: Secondary | ICD-10-CM

## 2021-01-03 MED ORDER — SILDENAFIL CITRATE 20 MG PO TABS: 40.0000 mg | ORAL_TABLET | Freq: Three times a day (TID) | ORAL | 11 refills | Status: DC

## 2021-01-04 ENCOUNTER — Ambulatory Visit (INDEPENDENT_AMBULATORY_CARE_PROVIDER_SITE_OTHER)
Admission: RE | Admit: 2021-01-04 | Discharge: 2021-01-04 | Disposition: A | Discharge status: Home / Self Care | Payer: 59 | Source: Ambulatory Visit | Attending: Internal Medicine | Admitting: Internal Medicine

## 2021-01-04 ENCOUNTER — Other Ambulatory Visit: Payer: Self-pay

## 2021-01-04 DIAGNOSIS — R0609 Other forms of dyspnea: Secondary | ICD-10-CM

## 2021-01-04 DIAGNOSIS — R0600 Dyspnea, unspecified: Secondary | ICD-10-CM | POA: Diagnosis not present

## 2021-01-04 IMAGING — CT CT CHEST HIGH RESOLUTION
2 of 8 series · 13 of 36 positions shown, 16 images · non-contrast
Comparison: [DATE]
COMPARISON: [DATE]

Addendum:
CLINICAL DATA: Dyspnea on exertion

EXAM:
CT CHEST WITHOUT CONTRAST
TECHNIQUE: Multidetector CT imaging of the chest was performed following the
standard protocol without intravenous contrast. High resolution
imaging of the lungs, as well as inspiratory and expiratory imaging,
was performed.

[Series 5: high resolution · axial · 0.57mm/px · z∈[-336,-68]mm · 10 of 323 slices shown, 13 images]
[im 27/323  mediastinal]
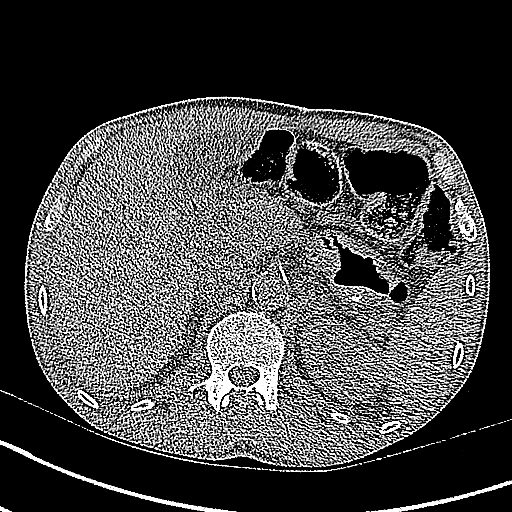
[im 27/323  lung]
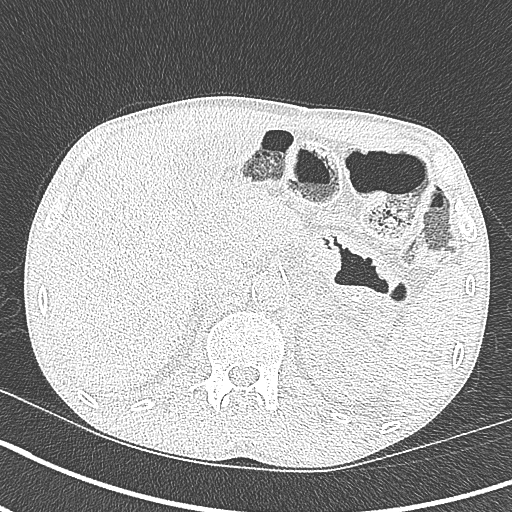
[im 54/323  lung]
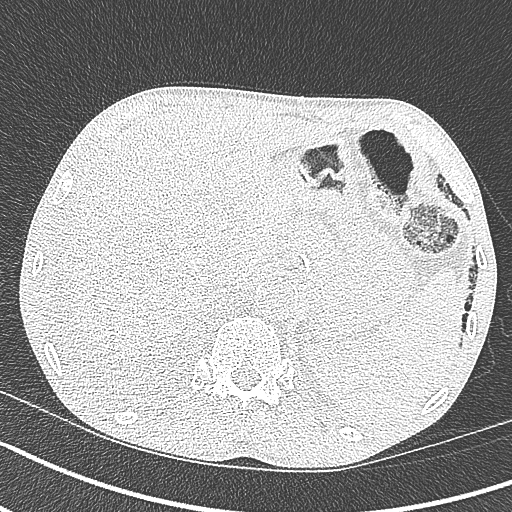
[im 81/323  lung]
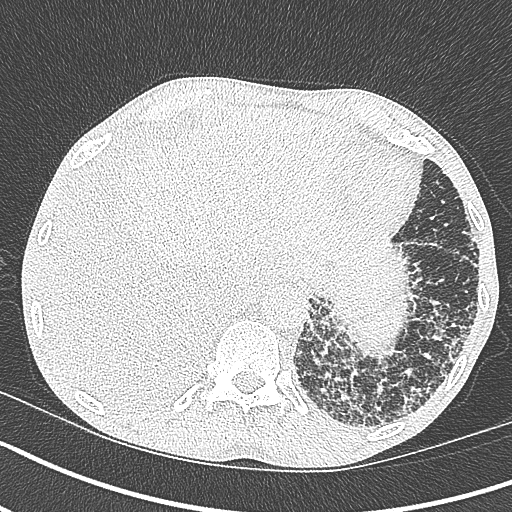
[im 108/323  lung]
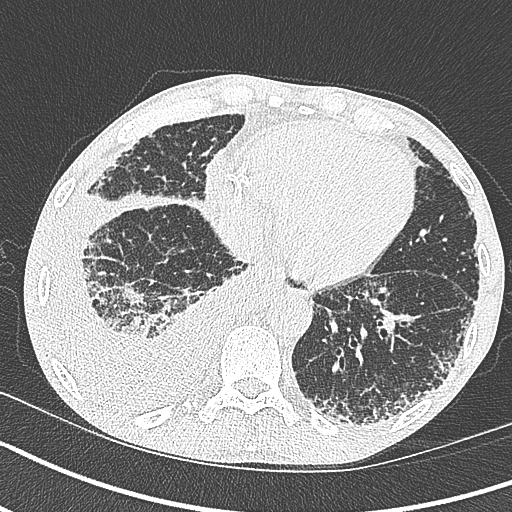
[im 135/323  mediastinal]
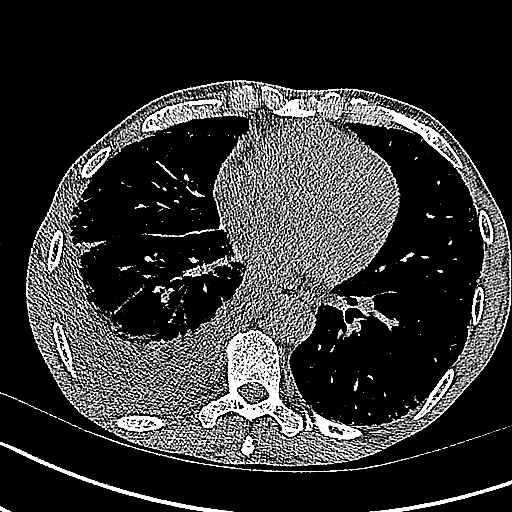
[im 135/323  lung]
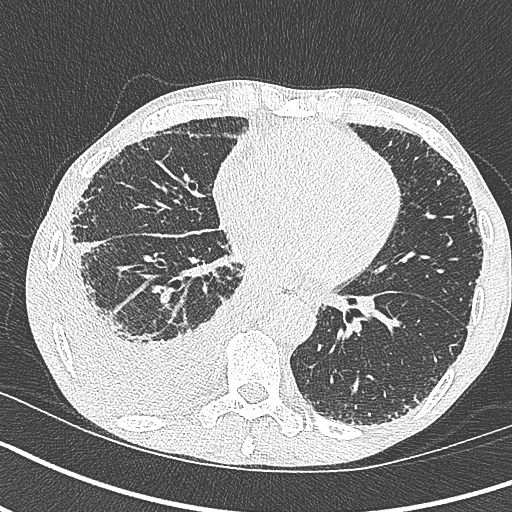
[im 188/323  lung]
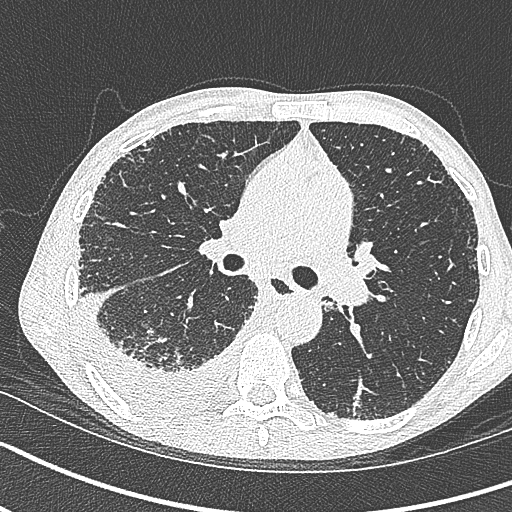
[im 215/323  lung]
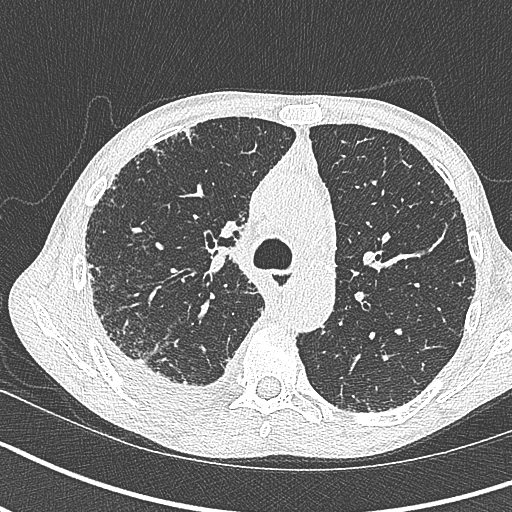
[im 242/323  lung]
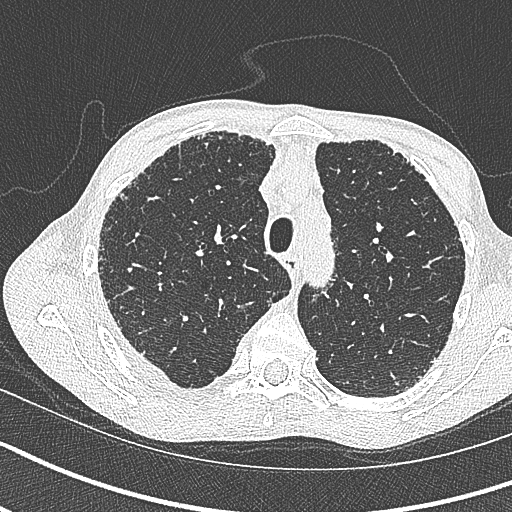
[im 269/323  mediastinal]
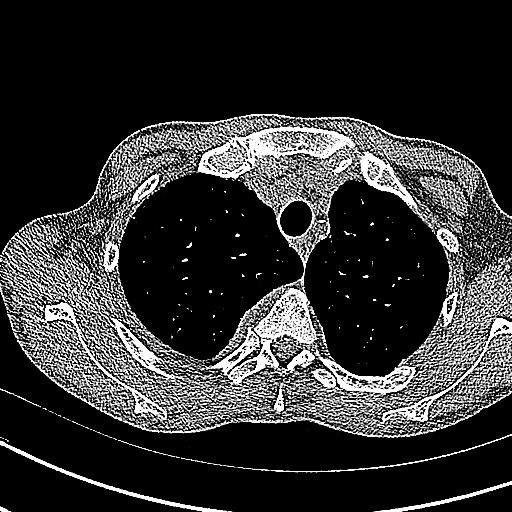
[im 269/323  lung]
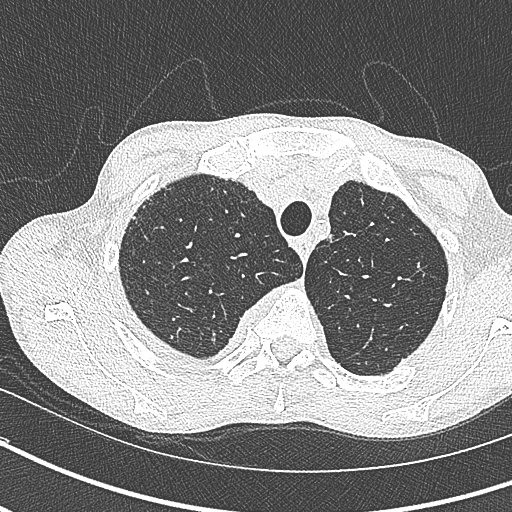
[im 296/323  lung]
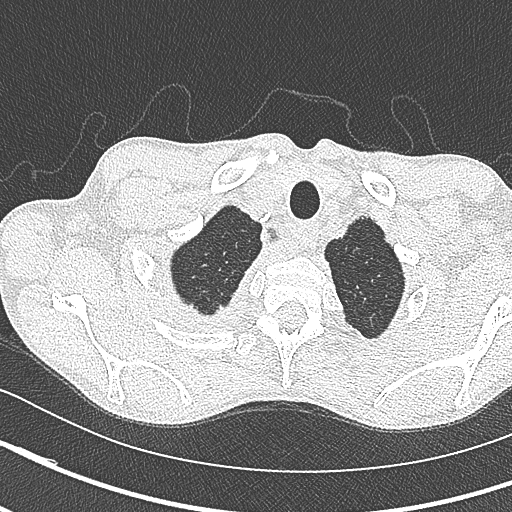

[Series 7: coronal · coronal · 0.55mm/px · 3 of 109 slices shown]
[im 22/109  lung]
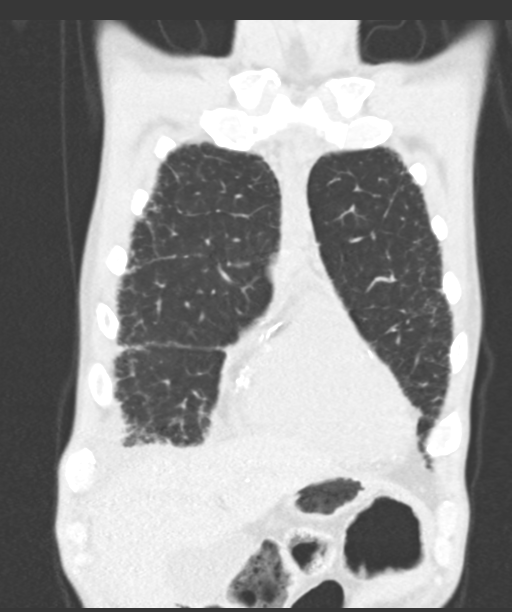
[im 44/109  lung]
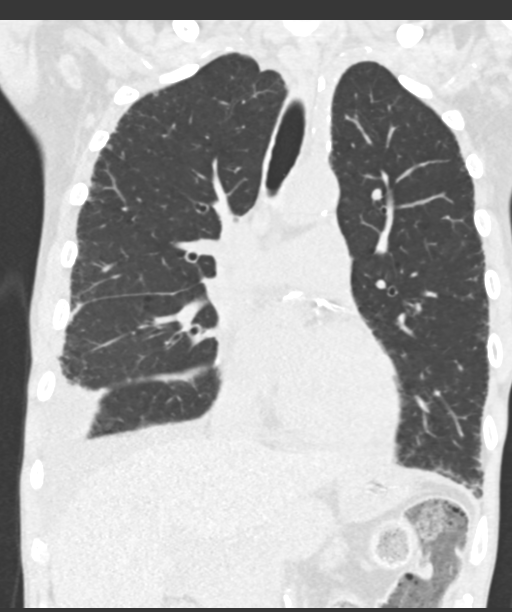
[im 65/109  lung]
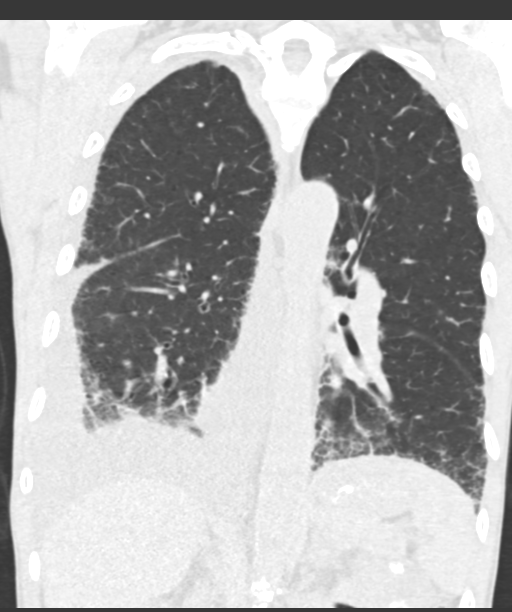

[13 of 36 positions shown; findings below may reference images not displayed]

FINDINGS: Cardiovascular: Aortic atherosclerosis. Normal heart size. Extensive
3 vessel coronary artery calcifications and/or stents. No
pericardial effusion.

Mediastinum/Nodes: No enlarged middle mediastinal, hilar or axillary
axillary lymph nodes. Unchanged enlarged lymph nodes or soft tissue
nodularity in the anterior mediastinum measuring up to 1.7 x 1.3 cm
(series 2, image 60). Thyroid gland, trachea, and esophagus
demonstrate no significant findings.

Lungs/Pleura: Unchanged, moderate, loculated right pleural effusion.
Minimal paraseptal emphysema. Redemonstrated moderate pulmonary
fibrosis in a pattern with apical to basal gradient, featuring
irregular peripheral interstitial opacity, septal thickening,
traction bronchiectasis, and areas of subpleural bronchiolectasis at
the lung basis without clear evidence of honeycombing. No
significant air trapping on expiratory phase imaging.

Upper Abdomen: No acute abnormality.

Musculoskeletal: No chest wall mass or suspicious bone lesions
identified.
IMPRESSION: 1. Redemonstrated moderate pulmonary fibrosis, not significantly
changed, in a pattern with apical to basal gradient, featuring
irregular peripheral interstitial opacity, septal thickening,
traction bronchiectasis, and areas of subpleural bronchiolectasis at
the lung bases without clear evidence of honeycombing. Findings are
categorized as probable UIP per consensus guidelines: Diagnosis of
Idiopathic Pulmonary Fibrosis: An Official ATS/ERS/JRS/ALAT Clinical
Practice Guideline. Am J Respir Crit Care Med Vol 198, NIKIA 5,
[5T]-e[DATE].
2. Unchanged, moderate, loculated right pleural effusion.
3. Unchanged enlarged lymph nodes or soft tissue nodularity in the
anterior mediastinum measuring up to 1.7 x 1.3 cm, of uncertain
etiology. Thymic neoplasm is a differential consideration and could
be further characterized by MRI if desired. Attention on follow-up.
4. Minimal emphysema.
5. Coronary artery disease.

Aortic Atherosclerosis ([5T]-[5T]) and Emphysema ([5T]-[5T]).

ADDENDUM:
Addendum is made to compare findings of this examination to a more
prior examination of the abdomen and pelvis dated [DATE].
Fibrotic findings were present in the included lung bases at that
time, in a similar pattern, very slightly progressed over time
[DATE]. Findings remain consistent with probable UIP.

*** End of Addendum ***
FINDINGS: Cardiovascular: Aortic atherosclerosis. Normal heart size. Extensive
3 vessel coronary artery calcifications and/or stents. No
pericardial effusion.

Mediastinum/Nodes: No enlarged middle mediastinal, hilar or axillary
axillary lymph nodes. Unchanged enlarged lymph nodes or soft tissue
nodularity in the anterior mediastinum measuring up to 1.7 x 1.3 cm
(series 2, image 60). Thyroid gland, trachea, and esophagus
demonstrate no significant findings.

Lungs/Pleura: Unchanged, moderate, loculated right pleural effusion.
Minimal paraseptal emphysema. Redemonstrated moderate pulmonary
fibrosis in a pattern with apical to basal gradient, featuring
irregular peripheral interstitial opacity, septal thickening,
traction bronchiectasis, and areas of subpleural bronchiolectasis at
the lung basis without clear evidence of honeycombing. No
significant air trapping on expiratory phase imaging.

Upper Abdomen: No acute abnormality.

Musculoskeletal: No chest wall mass or suspicious bone lesions
identified.
IMPRESSION: 1. Redemonstrated moderate pulmonary fibrosis, not significantly
changed, in a pattern with apical to basal gradient, featuring
irregular peripheral interstitial opacity, septal thickening,
traction bronchiectasis, and areas of subpleural bronchiolectasis at
the lung bases without clear evidence of honeycombing. Findings are
categorized as probable UIP per consensus guidelines: Diagnosis of
Idiopathic Pulmonary Fibrosis: An Official ATS/ERS/JRS/ALAT Clinical
Practice Guideline. Am J Respir Crit Care Med Vol 198, NIKIA 5,
[5T]-e[DATE].
2. Unchanged, moderate, loculated right pleural effusion.
3. Unchanged enlarged lymph nodes or soft tissue nodularity in the
anterior mediastinum measuring up to 1.7 x 1.3 cm, of uncertain
etiology. Thymic neoplasm is a differential consideration and could
be further characterized by MRI if desired. Attention on follow-up.
4. Minimal emphysema.
5. Coronary artery disease.

Aortic Atherosclerosis ([5T]-[5T]) and Emphysema ([5T]-[5T]).

## 2021-01-12 ENCOUNTER — Encounter (HOSPITAL_COMMUNITY): Payer: Self-pay

## 2021-01-18 ENCOUNTER — Ambulatory Visit (HOSPITAL_COMMUNITY)
Admission: RE | Admit: 2021-01-18 | Discharge: 2021-01-18 | Disposition: A | Discharge status: Home / Self Care | Payer: Medicare Other | Source: Ambulatory Visit | Attending: Nephrology | Admitting: Nephrology

## 2021-01-18 ENCOUNTER — Other Ambulatory Visit: Payer: Self-pay

## 2021-01-18 VITALS — BP 159/86 | HR 86 | Temp 97.5°F | Resp 18

## 2021-01-18 DIAGNOSIS — N183 Chronic kidney disease, stage 3 unspecified: Secondary | ICD-10-CM | POA: Diagnosis not present

## 2021-01-18 DIAGNOSIS — D631 Anemia in chronic kidney disease: Secondary | ICD-10-CM | POA: Insufficient documentation

## 2021-01-18 LAB — IRON AND TIBC
Iron: 58 ug/dL (ref 45–182)
Saturation Ratios: 23 % (ref 17.9–39.5)
TIBC: 248 ug/dL — ABNORMAL LOW (ref 250–450)
UIBC: 190 ug/dL

## 2021-01-18 LAB — RENAL FUNCTION PANEL
Albumin: 3 g/dL — ABNORMAL LOW (ref 3.5–5.0)
Anion gap: 9 (ref 5–15)
BUN: 57 mg/dL — ABNORMAL HIGH (ref 8–23)
CO2: 20 mmol/L — ABNORMAL LOW (ref 22–32)
Calcium: 8.8 mg/dL — ABNORMAL LOW (ref 8.9–10.3)
Chloride: 109 mmol/L (ref 98–111)
Creatinine, Ser: 3.38 mg/dL — ABNORMAL HIGH (ref 0.61–1.24)
GFR, Estimated: 20 mL/min — ABNORMAL LOW (ref >60)
Glucose, Bld: 118 mg/dL — ABNORMAL HIGH (ref 70–99)
Phosphorus: 3.3 mg/dL (ref 2.5–4.6)
Potassium: 4 mmol/L (ref 3.5–5.1)
Sodium: 138 mmol/L (ref 135–145)

## 2021-01-18 LAB — POCT HEMOGLOBIN-HEMACUE: Hemoglobin: 10.5 g/dL — ABNORMAL LOW (ref 13.0–17.0)

## 2021-01-18 MED ORDER — EPOETIN ALFA-EPBX 10000 UNIT/ML IJ SOLN
INTRAMUSCULAR | Status: AC
  Filled 2021-01-18: qty 2

## 2021-01-18 MED ORDER — EPOETIN ALFA-EPBX 10000 UNIT/ML IJ SOLN
20000.0000 [IU] | INTRAMUSCULAR | Status: DC
  Administered 2021-01-18: 20000 [IU] via SUBCUTANEOUS

## 2021-01-24 ENCOUNTER — Encounter: Payer: Self-pay | Admitting: Acute Care

## 2021-01-24 ENCOUNTER — Ambulatory Visit (INDEPENDENT_AMBULATORY_CARE_PROVIDER_SITE_OTHER): Payer: 59 | Admitting: Internal Medicine

## 2021-01-24 ENCOUNTER — Ambulatory Visit (INDEPENDENT_AMBULATORY_CARE_PROVIDER_SITE_OTHER): Payer: 59 | Admitting: Acute Care

## 2021-01-24 ENCOUNTER — Other Ambulatory Visit: Payer: Self-pay

## 2021-01-24 VITALS — BP 132/74 | HR 77 | Temp 97.0°F | Ht 68.0 in | Wt 110.0 lb

## 2021-01-24 DIAGNOSIS — J849 Interstitial pulmonary disease, unspecified: Secondary | ICD-10-CM | POA: Diagnosis not present

## 2021-01-24 DIAGNOSIS — F17210 Nicotine dependence, cigarettes, uncomplicated: Secondary | ICD-10-CM

## 2021-01-24 DIAGNOSIS — J9859 Other diseases of mediastinum, not elsewhere classified: Secondary | ICD-10-CM | POA: Diagnosis not present

## 2021-01-24 DIAGNOSIS — M349 Systemic sclerosis, unspecified: Secondary | ICD-10-CM

## 2021-01-24 DIAGNOSIS — R0609 Other forms of dyspnea: Secondary | ICD-10-CM

## 2021-01-24 LAB — PULMONARY FUNCTION TEST
DL/VA % pred: 82 %
DL/VA: 3.49 ml/min/mmHg/L
DLCO cor % pred: 50 %
DLCO cor: 13.02 ml/min/mmHg
DLCO unc % pred: 43 %
DLCO unc: 11.22 ml/min/mmHg
FEF 25-75 Post: 1.68 L/sec
FEF 25-75 Pre: 2.07 L/sec
FEF2575-%Change-Post: -19 %
FEF2575-%Pred-Post: 63 %
FEF2575-%Pred-Pre: 78 %
FEV1-%Change-Post: -9 %
FEV1-%Pred-Post: 66 %
FEV1-%Pred-Pre: 73 %
FEV1-Post: 1.9 L
FEV1-Pre: 2.1 L
FEV1FVC-%Change-Post: -13 %
FEV1FVC-%Pred-Pre: 108 %
FEV6-%Change-Post: 0 %
FEV6-%Pred-Post: 70 %
FEV6-%Pred-Pre: 69 %
FEV6-Post: 2.49 L
FEV6-Pre: 2.47 L
FEV6FVC-%Change-Post: 0 %
FEV6FVC-%Pred-Post: 103 %
FEV6FVC-%Pred-Pre: 104 %
FVC-%Change-Post: 4 %
FVC-%Pred-Post: 69 %
FVC-%Pred-Pre: 67 %
FVC-Post: 2.58 L
FVC-Pre: 2.47 L
Post FEV1/FVC ratio: 74 %
Post FEV6/FVC ratio: 100 %
Pre FEV1/FVC ratio: 85 %
Pre FEV6/FVC Ratio: 100 %
RV % pred: 123 %
RV: 2.69 L
TLC % pred: 74 %
TLC: 4.93 L

## 2021-01-24 NOTE — Patient Instructions (Addendum)
It is good to talk to you today. We will not start anti fibrotic medications today. We will schedule you for follow up with Dr. Chase Caller in 1 month.  Call if you need [place] sooner.  We will refer you to [facility]>> Dr. [name] for mediastinal mass.  Please work on quitting smoking.  This can make progression of your interstitial lung disease  more rapid. Please contact office for sooner follow up if symptoms do not improve or worsen or seek emergency care

## 2021-01-24 NOTE — Patient Instructions (Signed)
Full PFT performed today.

## 2021-01-24 NOTE — Progress Notes (Signed)
Full PFT performed today.

## 2021-01-24 NOTE — Progress Notes (Signed)
History of Present Illness [name] is a 60 y.o. male current every day smoker ( Smoker 1 PPD x 49 years) with severe Scleroderma, history of pericardial effusion 2/2 scleroderma, PAH, CAD post stent, . Raynaud's syndrome without gangrene with extensive digital ulceration, loss of digital tufts ( on sildenafil) , chronic anemia, chromic kidney disease,  Hx SBO and pseudomonas bacteremia, AV malformation with colonic bleed, abnormal LFT's and HTN. He was referred  for consult of dyspnea on exertion, dry cough,and was seen by Dr. Chase Skinner on 12/23/2020. He was referred by his rheumatology physician for his complex scleroderma , which puts him at high risk for ILD. Per Dr. Golden Skinner note, patient has minimal dyspnea with exertion.   Pt. Care Team PCP Cody Solian, MD Patient Care Team: Cody Solian, MD as PCP - General (Internal Medicine) Cody Harp, MD as PCP - Cardiology (Cardiology) Skinner, Cody Pascal, MD as PCP - Advanced Heart Failure (Cardiology) Cody Merino, MD as Attending Physician (Rheumatology)    01/24/2021  Pt. Presents for follow up. He has had HRCT on 01/04/2021 and PFT's  today. I have personally reviewed his HRCT and PFT's. I explained that his HRCT shows moderate ILD, with a moderately decreased DLCO. I explained that this is scarring that affects the flow of oxygen and CO2 between the blood vessels and the lung.  He states he has some shortness of breath , but usually only with exertion and when walking up a hill.  He does not want to start anti fibrotic therapy at present time as he feels his quality of life is not negatively impacted , and that he does not have severe dyspnea. He has creatinine of 3, CAD and hepatic steatosis. He does not want to add another medication at this point, but is open to re-assessing this should his ILD wprsen. I have explained that this is a progressive disease, and medication can prevent progression, but will not repair  previous damage.   He does want close follow up with PFT's and HRCT's to ensure no further progression. He will consider antri fibrotics if there is progression of disease , or he becomes clinically worse. I did ask the patient to work on quitting smoking. I explained that continued smoking puts him at a higher risk for progression and worsening of his underlying pulmonary disease., as well as lung cancer    I did let [name] , and his wife know that there was notation of stable enlarged lymph nodes or soft tissue nodularity in the anterior mediastinum measuring up to 1.7 x 1.3 cm. I am going to refer to Dr. Roxan Skinner ( [facility]) , and he knows the patient to assess need for any treatment with his smoking history.   Sat is 99% and HR is 76 at today's office visit. He is in no distress.   Test Results: 01/04/2021 HRCT Redemonstrated moderate pulmonary fibrosis, not significantly changed, in a pattern with apical to basal gradient, featuring irregular peripheral interstitial opacity, septal thickening, traction bronchiectasis, and areas of subpleural bronchiolectasis at the lung bases without clear evidence of honeycombing. Findings are categorized as probable UIP per consensus guidelines  Unchanged, moderate, loculated right pleural effusion. Unchanged enlarged lymph nodes or soft tissue nodularity in the anterior mediastinum measuring up to 1.7 x 1.3 cm, of uncertain etiology. Thymic neoplasm is a differential consideration and could be further characterized by MRI if desired  Emphysema, CAD  PFT's 01/24/2021  CBC Latest Ref Rng & Units 01/18/2021 12/21/2020 11/23/2020  WBC 4.0 - 10.5 K/uL - - -  Hemoglobin 13.0 - 17.0 g/dL 10.5(L) 9.4(L) 10.6(L)  Hematocrit 39.0 - 52.0 % - - -  Platelets 150 - 400 K/uL - - -    BMP Latest Ref Rng & Units 01/18/2021 12/21/2020 11/23/2020  Glucose 70 - 99 mg/dL 118(H) 144(H) 102(H)  BUN 8 - 23 mg/dL 57(H) 46(H) 60(H)  Creatinine  0.61 - 1.24 mg/dL 3.38(H) 3.05(H) 3.00(H)  BUN/Creat Ratio 9 - 20 - - -  Sodium 135 - 145 mmol/L 138 140 139  Potassium 3.5 - 5.1 mmol/L 4.0 3.8 3.8  Chloride 98 - 111 mmol/L 109 113(H) 110  CO2 22 - 32 mmol/L 20(L) 20(L) 18(L)  Calcium 8.9 - 10.3 mg/dL 8.8(L) 8.1(L) 7.8(L)    BNP    Component Value Date/Time   BNP 184.7 (H) 09/28/2019 1310   BNP 235.3 (H) 02/24/2016 1304    ProBNP    Component Value Date/Time   PROBNP 4,482 (H) 01/15/2018 1509    PFT    Component Value Date/Time   FEV1PRE 2.10 01/24/2021 0946   FEV1POST 1.90 01/24/2021 0946   FVCPRE 2.47 01/24/2021 0946   FVCPOST 2.58 01/24/2021 0946   TLC 4.93 01/24/2021 0946   DLCOUNC 11.22 01/24/2021 0946   PREFEV1FVCRT 85 01/24/2021 0946   PSTFEV1FVCRT 74 01/24/2021 0946    CT Chest High Resolution  Result Date: 01/05/2021 CLINICAL DATA:  Dyspnea on exertion EXAM: CT CHEST WITHOUT CONTRAST TECHNIQUE: Multidetector CT imaging of the chest was performed following the standard protocol without intravenous contrast. High resolution imaging of the lungs, as well as inspiratory and expiratory imaging, was performed. COMPARISON:  11/07/2019 FINDINGS: Cardiovascular: Aortic atherosclerosis. Normal heart size. Extensive 3 vessel coronary artery calcifications and/or stents. No pericardial effusion. Mediastinum/Nodes: No enlarged middle mediastinal, hilar or axillary axillary lymph nodes. Unchanged enlarged lymph nodes or soft tissue nodularity in the anterior mediastinum measuring up to 1.7 x 1.3 cm (series 2, image 60). Thyroid gland, trachea, and esophagus demonstrate no significant findings. Lungs/Pleura: Unchanged, moderate, loculated right pleural effusion. Minimal paraseptal emphysema. Redemonstrated moderate pulmonary fibrosis in a pattern with apical to basal gradient, featuring irregular peripheral interstitial opacity, septal thickening, traction bronchiectasis, and areas of subpleural bronchiolectasis at the lung basis  without clear evidence of honeycombing. No significant air trapping on expiratory phase imaging. Upper Abdomen: No acute abnormality. Musculoskeletal: No chest wall mass or suspicious bone lesions identified. IMPRESSION: 1. Redemonstrated moderate pulmonary fibrosis, not significantly changed, in a pattern with apical to basal gradient, featuring irregular peripheral interstitial opacity, septal thickening, traction bronchiectasis, and areas of subpleural bronchiolectasis at the lung bases without clear evidence of honeycombing. Findings are categorized as probable UIP per consensus guidelines: Diagnosis of Idiopathic Pulmonary Fibrosis: An Official ATS/ERS/JRS/ALAT Clinical Practice Guideline. [place], Iss 5, [phone], Dec 23 2016. 2. Unchanged, moderate, loculated right pleural effusion. 3. Unchanged enlarged lymph nodes or soft tissue nodularity in the anterior mediastinum measuring up to 1.7 x 1.3 cm, of uncertain etiology. Thymic neoplasm is a differential consideration and could be further characterized by MRI if desired. Attention on follow-up. 4. Minimal emphysema. 5. Coronary artery disease. Aortic Atherosclerosis (ICD10-I70.0) and Emphysema (ICD10-J43.9). Electronically Signed   By: Eddie Candle M.D.   On: 01/05/2021 08:29     Past medical hx Past Medical History:  Diagnosis Date   (HFpEF) heart failure with preserved ejection fraction ([place])    Anxiety  CAD (coronary artery disease)    CKD (chronic kidney disease), stage IV ([facility])    Gout    Hyperlipidemia    Hypertension 05/14/2012    Lexiscan-- EF 51% ,LV normal   Hypothyroid    MI (myocardial infarction) ([place]) 2010   Pericardial effusion 12/2008   Dr [name] performed a subxiphoid window removing 28m of fluid   Pericardial effusion 03/09/2010   Echo-LVEF >55%, very small pericardial effusion ,,Stage 1 (impaired ) diastolic fxn, elevated LV filling   Pericarditis    Raynaud's phenomenon    Scleroderma  ([place]    Smoker 09/16/2018   1 ppd   Vitiligo      Social History   Tobacco Use   Smoking status: Every Day    Packs/day: 1.00    Years: 38.00    Pack years: 38.00    Types: Cigarettes    Start date: 10/21/1971   Smokeless tobacco: Never  Vaping Use   Vaping Use: Never used  Substance Use Topics   Alcohol use: Yes    Comment: rarely    Drug use: No    Mr.Favaro reports that he has been smoking cigarettes. He started smoking about 49 years ago. He has a 38.00 pack-year smoking history. He has never used smokeless tobacco. He reports current alcohol use. He reports that he does not use drugs.  Tobacco Cessation: Current every day smoker , with a 49 pack year smoking history  Past surgical hx, Family hx, Social hx all reviewed.  Current Outpatient Medications on File Prior to Visit  Medication Sig   acetaminophen (TYLENOL) 500 MG tablet Take 500-1,000 mg by mouth every 6 (six) hours as needed for mild pain or headache.   allopurinol (ZYLOPRIM) 100 MG tablet TAKE 1 TABLET BY MOUTH EVERY DAY   ALPRAZolam (XANAX) 0.5 MG tablet Take 0.25 mg by mouth as needed for anxiety.   colchicine 0.6 MG tablet Take 0.6 mg by mouth daily as needed (gout attacks).   Epoetin Alfa-epbx (RETACRIT IJ) Inject as directed every 30 (thirty) days.   fluticasone (FLONASE) 50 MCG/ACT nasal spray Place 2 sprays into both nostrils daily as needed (seasonal allergies).    HYDROcodone-acetaminophen (NORCO) 10-325 MG tablet Take 0.5-1 tablets by mouth every 6 (six) hours as needed for moderate pain.   macitentan (OPSUMIT) 10 MG tablet Take 1 tablet (10 mg total) by mouth daily.   methocarbamol (ROBAXIN) 500 MG tablet Take 1 tablet (500 mg total) by mouth 2 (two) times daily as needed for muscle spasms.   Nutritional Supplements (ENSURE ACTIVE) LIQD Take 1 Dose by mouth. 3 x aday prn   pantoprazole (PROTONIX) 40 MG tablet Take 40 mg by mouth daily.   potassium chloride SA (KLOR-CON M20) 20 MEQ tablet Take 1  tablet (20 mEq total) by mouth 2 (two) times daily.   rosuvastatin (CRESTOR) 20 MG tablet Take 20 mg by mouth daily.   sildenafil (REVATIO) 20 MG tablet Take 2 tablets (40 mg total) by mouth 3 (three) times daily.   torsemide (DEMADEX) 20 MG tablet TAKE 1 TABLET BY MOUTH TWICE A DAY   Current Facility-Administered Medications on File Prior to Visit  Medication   0.9 %  sodium chloride infusion     Allergies  Allergen Reactions   Oxycodone Other (See Comments)    Hallucinations    Review Of Systems:  Constitutional:   No  weight loss, night sweats,  Fevers, chills, fatigue, or  lassitude.  HEENT:   No headaches,  Difficulty swallowing,  Tooth/dental problems, or  Sore throat,                No sneezing, itching, ear ache, nasal congestion, post nasal drip,   CV:  No chest pain,  Orthopnea, PND, swelling in lower extremities, anasarca, dizziness, palpitations, syncope.   GI  No heartburn, indigestion, abdominal pain, nausea, vomiting, diarrhea, change in bowel habits, loss of appetite, bloody stools.   Resp: + shortness of breath with exertion none at rest.  No excess mucus, no productive cough,  + non-productive cough,  No coughing up of blood.  No change in color of mucus.  No wheezing.  No chest wall deformity  Skin: no rash or lesions.  GU: no dysuria, change in color of urine, no urgency or frequency.  No flank pain, no hematuria   MS:  No joint pain or swelling.  No decreased range of motion.  No back pain.  Psych:  No change in mood or affect. No depression or anxiety.  No memory loss.   Vital Signs BP 132/74   Pulse 77   Temp (!) 97 F (36.1 C) (Oral)   Ht _0  (1.727 m)   Wt 110 lb (49.9 kg)   SpO2 99%   BMI 16.73 kg/m    Physical Exam:  General- No distress,  A&Ox3, pleasant ENT: No sinus tenderness, TM clear, pale nasal mucosa, no oral exudate,no post nasal drip, no LAN Cardiac: S1, S2, regular rate and rhythm, no murmur Chest: No wheeze/ rales/  dullness; no accessory muscle use, no nasal flaring, no sternal retractions, diminished per bases , few crackles noted per bases bilaterally Abd.: Soft Non-tender, ND, BS +, Body mass index is 16.73 kg/m.  Ext: No clubbing cyanosis, edema, extensive digital ulceration, loss of digital tufts Neuro:  normal strength, MAE x 4, A&O x 3, appropriate Skin: No rashes, warm and dry, digital ulceration  Psych: normal mood and behavior   Assessment/Plan  Moderate ILD per HRCT Dyspnea on exertion >> mild PFT's restrictive with moderately decreased DLCO Does not want to start anti fibrotics at present time>> Creatinine > 3, and hx. SBO. If anti fibrotics started recommend Esbriet just at lowest dose 1 tab tid due to high creat  Plan We will not start anti fibrotic medications today. We will schedule you for follow up with Dr. Chase Skinner in 1 month. ( Scheduled for 10/3) We will continue to follow you closely with HRCT/ PFT/ 6 minute walks We will schedule a 6 minute walk before your next appointment with Dr. Chase Skinner, so he can review.  Call if you need [place] sooner.  We will refer you to [facility]>> Dr. Roxan Skinner for mediastinal mass evaluation .  Please work on quitting smoking.  This can make progression of your interstitial lung disease  more rapid. Please contact office for sooner follow up if symptoms do not improve or worsen or seek emergency care   Enlarged lymph nodes or soft tissue nodularity in the anterior mediastinum measuring up to 1.7 x 1.3 cm, in current every day smoker Plan Refer to [facility] for evaluation  >> Dr. Roxan Skinner   Scleroderma Plan Follow up with Dr. Hazel Sams, and the rest of your health care team  I spent 30 minutes dedicated to the care of this patient on the date of this encounter to include pre-visit review of records, face-to-face time with the patient discussing conditions above, post visit ordering of testing, clinical documentation with the electronic health  record,  making appropriate referrals as documented, and communicating necessary information to the patient's healthcare team.   Magdalen Spatz, NP 01/24/2021  12:15 PM

## 2021-01-31 ENCOUNTER — Other Ambulatory Visit: Payer: Self-pay | Admitting: Physician Assistant

## 2021-02-01 NOTE — Telephone Encounter (Signed)
Next Visit: 05/02/2020  Last Visit: 10/27/2020  Last Fill: 11/01/2020  DX: : Idiopathic chronic gout of multiple sites without tophus  Current Dose per office note 10/27/2020: allopurinol 100 mg daily   Labs:  11/09/2019 RBC 2.72, Hgb 8.2, Hct 26.4, RDW 16.4, Lymphs Abs 0.2, Total Protein 4.8, Albumin 1.8, AST 93, ALT 146, Alk. Phos 475, Chloride 113, CO2 18 BUN 29, Calcium 6.8, Creatinine 1.52, GFR 56, I called patient, patient will have labs drawn 02/02/2021  Okay to refill Allopurinol?

## 2021-02-02 ENCOUNTER — Other Ambulatory Visit: Payer: Self-pay | Admitting: *Deleted

## 2021-02-02 DIAGNOSIS — M1A09X0 Idiopathic chronic gout, multiple sites, without tophus (tophi): Secondary | ICD-10-CM

## 2021-02-02 DIAGNOSIS — Z79899 Other long term (current) drug therapy: Secondary | ICD-10-CM

## 2021-02-03 LAB — COMPLETE METABOLIC PANEL WITH GFR
AG Ratio: 1.2 (calc) (ref 1.0–2.5)
ALT: 46 U/L (ref 9–46)
AST: 49 U/L — ABNORMAL HIGH (ref 10–35)
Albumin: 3.7 g/dL (ref 3.6–5.1)
Alkaline phosphatase (APISO): 98 U/L (ref 35–144)
BUN/Creatinine Ratio: 19 (calc) (ref 6–22)
BUN: 62 mg/dL — ABNORMAL HIGH (ref 7–25)
CO2: 19 mmol/L — ABNORMAL LOW (ref 20–32)
Calcium: 8.4 mg/dL — ABNORMAL LOW (ref 8.6–10.3)
Chloride: 112 mmol/L — ABNORMAL HIGH (ref 98–110)
Creat: 3.21 mg/dL — ABNORMAL HIGH (ref 0.70–1.35)
Globulin: 3.1 g/dL (calc) (ref 1.9–3.7)
Glucose, Bld: 131 mg/dL — ABNORMAL HIGH (ref 65–99)
Potassium: 4.2 mmol/L (ref 3.5–5.3)
Sodium: 141 mmol/L (ref 135–146)
Total Bilirubin: 0.3 mg/dL (ref 0.2–1.2)
Total Protein: 6.8 g/dL (ref 6.1–8.1)
eGFR: 21 mL/min/{1.73_m2} — ABNORMAL LOW (ref >=60)

## 2021-02-03 LAB — CBC WITH DIFFERENTIAL/PLATELET
Absolute Monocytes: 249 cells/uL (ref 200–950)
Basophils Absolute: 21 cells/uL (ref 0–200)
Basophils Relative: 0.6 %
Eosinophils Absolute: 60 cells/uL (ref 15–500)
Eosinophils Relative: 1.7 %
HCT: 32.8 % — ABNORMAL LOW (ref 38.5–50.0)
Hemoglobin: 10.3 g/dL — ABNORMAL LOW (ref 13.2–17.1)
Lymphs Abs: 819 cells/uL — ABNORMAL LOW (ref 850–3900)
MCH: 30.1 pg (ref 27.0–33.0)
MCHC: 31.4 g/dL — ABNORMAL LOW (ref 32.0–36.0)
MCV: 95.9 fL (ref 80.0–100.0)
MPV: 12.4 fL (ref 7.5–12.5)
Monocytes Relative: 7.1 %
Neutro Abs: 2352 cells/uL (ref 1500–7800)
Neutrophils Relative %: 67.2 %
Platelets: 149 10*3/uL (ref 140–400)
RBC: 3.42 10*6/uL — ABNORMAL LOW (ref 4.20–5.80)
RDW: 15.7 % — ABNORMAL HIGH (ref 11.0–15.0)
Total Lymphocyte: 23.4 %
WBC: 3.5 10*3/uL — ABNORMAL LOW (ref 3.8–10.8)

## 2021-02-03 LAB — URIC ACID: Uric Acid, Serum: 5.3 mg/dL (ref 4.0–8.0)

## 2021-02-10 ENCOUNTER — Encounter: Payer: 59 | Admitting: Thoracic Surgery (Cardiothoracic Vascular Surgery)

## 2021-02-14 ENCOUNTER — Other Ambulatory Visit: Payer: Self-pay | Admitting: Thoracic Surgery (Cardiothoracic Vascular Surgery)

## 2021-02-14 ENCOUNTER — Other Ambulatory Visit: Payer: Self-pay

## 2021-02-14 ENCOUNTER — Encounter: Payer: Self-pay | Admitting: Thoracic Surgery (Cardiothoracic Vascular Surgery)

## 2021-02-14 ENCOUNTER — Institutional Professional Consult (permissible substitution) (INDEPENDENT_AMBULATORY_CARE_PROVIDER_SITE_OTHER): Payer: 59 | Admitting: Thoracic Surgery (Cardiothoracic Vascular Surgery)

## 2021-02-14 ENCOUNTER — Encounter (HOSPITAL_COMMUNITY): Payer: Self-pay

## 2021-02-14 VITALS — BP 121/78 | HR 101 | Resp 20 | Ht 68.0 in | Wt 112.0 lb

## 2021-02-14 DIAGNOSIS — I2510 Atherosclerotic heart disease of native coronary artery without angina pectoris: Secondary | ICD-10-CM

## 2021-02-14 DIAGNOSIS — D4989 Neoplasm of unspecified behavior of other specified sites: Secondary | ICD-10-CM | POA: Diagnosis not present

## 2021-02-14 DIAGNOSIS — I3139 Other pericardial effusion (noninflammatory): Secondary | ICD-10-CM
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
## 2021-02-15 ENCOUNTER — Ambulatory Visit (HOSPITAL_COMMUNITY)
Admission: RE | Admit: 2021-02-15 | Discharge: 2021-02-15 | Disposition: A | Discharge status: Home / Self Care | Payer: Medicare Other | Source: Ambulatory Visit | Attending: Nephrology | Admitting: Nephrology

## 2021-02-15 VITALS — BP 147/74 | Temp 97.5°F | Resp 18

## 2021-02-15 DIAGNOSIS — N183 Chronic kidney disease, stage 3 unspecified: Secondary | ICD-10-CM

## 2021-02-15 DIAGNOSIS — K922 Gastrointestinal hemorrhage, unspecified: Secondary | ICD-10-CM | POA: Diagnosis not present

## 2021-02-15 DIAGNOSIS — K625 Hemorrhage of anus and rectum: Secondary | ICD-10-CM | POA: Diagnosis not present

## 2021-02-15 LAB — RENAL FUNCTION PANEL
Albumin: 2.9 g/dL — ABNORMAL LOW (ref 3.5–5.0)
Anion gap: 10 (ref 5–15)
BUN: 62 mg/dL — ABNORMAL HIGH (ref 8–23)
CO2: 18 mmol/L — ABNORMAL LOW (ref 22–32)
Calcium: 8.3 mg/dL — ABNORMAL LOW (ref 8.9–10.3)
Chloride: 111 mmol/L (ref 98–111)
Creatinine, Ser: 3.18 mg/dL — ABNORMAL HIGH (ref 0.61–1.24)
GFR, Estimated: 21 mL/min — ABNORMAL LOW (ref >60)
Glucose, Bld: 80 mg/dL (ref 70–99)
Phosphorus: 3.7 mg/dL (ref 2.5–4.6)
Potassium: 4.5 mmol/L (ref 3.5–5.1)
Sodium: 139 mmol/L (ref 135–145)

## 2021-02-15 LAB — IRON AND TIBC
Iron: 57 ug/dL (ref 45–182)
Saturation Ratios: 22 % (ref 17.9–39.5)
TIBC: 253 ug/dL (ref 250–450)
UIBC: 196 ug/dL

## 2021-02-15 LAB — FERRITIN: Ferritin: 408 ng/mL — ABNORMAL HIGH (ref 24–336)

## 2021-02-15 LAB — POCT HEMOGLOBIN-HEMACUE: Hemoglobin: 8.8 g/dL — ABNORMAL LOW (ref 13.0–17.0)

## 2021-02-15 MED ORDER — EPOETIN ALFA-EPBX 10000 UNIT/ML IJ SOLN
20000.0000 [IU] | INTRAMUSCULAR | Status: DC
  Administered 2021-02-15: 20000 [IU] via SUBCUTANEOUS

## 2021-02-15 MED ORDER — EPOETIN ALFA-EPBX 10000 UNIT/ML IJ SOLN
INTRAMUSCULAR | Status: AC
  Filled 2021-02-15: qty 2

## 2021-02-16 ENCOUNTER — Other Ambulatory Visit: Payer: Self-pay

## 2021-02-16 ENCOUNTER — Ambulatory Visit (INDEPENDENT_AMBULATORY_CARE_PROVIDER_SITE_OTHER): Payer: 59 | Admitting: Internal Medicine

## 2021-02-16 DIAGNOSIS — R0609 Other forms of dyspnea: Secondary | ICD-10-CM | POA: Diagnosis not present

## 2021-02-16 DIAGNOSIS — J849 Interstitial pulmonary disease, unspecified: Secondary | ICD-10-CM

## 2021-02-16 LAB — PTH, INTACT AND CALCIUM
Calcium, Total (PTH): 8.2 mg/dL — ABNORMAL LOW (ref 8.6–10.2)
PTH: 89 pg/mL — ABNORMAL HIGH (ref 15–65)

## 2021-02-16 NOTE — Progress Notes (Signed)
Six Minute Walk - 02/16/21 0950       Six Minute Walk   Medications taken before test (dose and time) allopurinol 100 mg, flonase, opsumit 10 mg, protonix 40 mg, K 20 ,meq, revatio 40 mg, torsemide 20 mg- all taken around 8 am.    Supplemental oxygen during test? No    Lap distance in meters  34 meters    Laps Completed 8    Partial lap (in meters) 0 meters    Baseline BP (sitting) 132/70    Baseline Heartrate 81    Baseline Dyspnea (Borg Scale) 0    Baseline Fatigue (Borg Scale) 0    Baseline SPO2 98 %   on RA     End of Test Values    BP (sitting) 136/74    Heartrate 98    Dyspnea (Borg Scale) 1    Fatigue (Borg Scale) 0    SPO2 91 %      2 Minutes Post Walk Values   BP (sitting) 132/70    Heartrate 86    SPO2 100 %   on RA   Stopped or paused before six minutes? Yes    Reason: pt asked to stop test due to tightness in his legs with 2 min and 8 seconds remaining.    Other Symptoms at end of exercise: Calf pain      Interpretation   Distance completed 272 meters
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
## 2021-02-17 ENCOUNTER — Encounter (HOSPITAL_COMMUNITY): Payer: Self-pay | Admitting: Emergency Medicine

## 2021-02-17 ENCOUNTER — Inpatient Hospital Stay (HOSPITAL_COMMUNITY)
Admission: EM | Admit: 2021-02-17 | Discharge: 2021-02-21 | DRG: 377 | Disposition: A | Discharge status: Home / Self Care | Payer: Medicare Other | Attending: Internal Medicine | Admitting: Internal Medicine

## 2021-02-17 ENCOUNTER — Emergency Department (HOSPITAL_COMMUNITY): Payer: Medicare Other

## 2021-02-17 ENCOUNTER — Telehealth: Payer: Self-pay

## 2021-02-17 DIAGNOSIS — Z885 Allergy status to narcotic agent status: Secondary | ICD-10-CM

## 2021-02-17 DIAGNOSIS — I7300 Raynaud's syndrome without gangrene: Secondary | ICD-10-CM | POA: Diagnosis present

## 2021-02-17 DIAGNOSIS — Z79899 Other long term (current) drug therapy: Secondary | ICD-10-CM

## 2021-02-17 DIAGNOSIS — M349 Systemic sclerosis, unspecified: Secondary | ICD-10-CM | POA: Diagnosis present

## 2021-02-17 DIAGNOSIS — I130 Hypertensive heart and chronic kidney disease with heart failure and stage 1 through stage 4 chronic kidney disease, or unspecified chronic kidney disease: Secondary | ICD-10-CM | POA: Diagnosis present

## 2021-02-17 DIAGNOSIS — R0602 Shortness of breath: Secondary | ICD-10-CM

## 2021-02-17 DIAGNOSIS — I700 Atherosclerosis of aorta: Secondary | ICD-10-CM | POA: Diagnosis present

## 2021-02-17 DIAGNOSIS — E785 Hyperlipidemia, unspecified: Secondary | ICD-10-CM | POA: Diagnosis present

## 2021-02-17 DIAGNOSIS — D125 Benign neoplasm of sigmoid colon: Secondary | ICD-10-CM | POA: Diagnosis present

## 2021-02-17 DIAGNOSIS — R1310 Dysphagia, unspecified: Secondary | ICD-10-CM | POA: Diagnosis present

## 2021-02-17 DIAGNOSIS — J84112 Idiopathic pulmonary fibrosis: Secondary | ICD-10-CM | POA: Diagnosis present

## 2021-02-17 DIAGNOSIS — K31819 Angiodysplasia of stomach and duodenum without bleeding: Secondary | ICD-10-CM | POA: Diagnosis present

## 2021-02-17 DIAGNOSIS — M1A9XX1 Chronic gout, unspecified, with tophus (tophi): Secondary | ICD-10-CM | POA: Diagnosis present

## 2021-02-17 DIAGNOSIS — K625 Hemorrhage of anus and rectum: Secondary | ICD-10-CM | POA: Diagnosis present

## 2021-02-17 DIAGNOSIS — R7989 Other specified abnormal findings of blood chemistry: Secondary | ICD-10-CM | POA: Diagnosis present

## 2021-02-17 DIAGNOSIS — J9811 Atelectasis: Secondary | ICD-10-CM | POA: Diagnosis present

## 2021-02-17 DIAGNOSIS — N183 Chronic kidney disease, stage 3 unspecified: Secondary | ICD-10-CM | POA: Diagnosis present

## 2021-02-17 DIAGNOSIS — I890 Lymphedema, not elsewhere classified: Secondary | ICD-10-CM | POA: Diagnosis present

## 2021-02-17 DIAGNOSIS — I252 Old myocardial infarction: Secondary | ICD-10-CM

## 2021-02-17 DIAGNOSIS — Z955 Presence of coronary angioplasty implant and graft: Secondary | ICD-10-CM

## 2021-02-17 DIAGNOSIS — N184 Chronic kidney disease, stage 4 (severe): Secondary | ICD-10-CM | POA: Diagnosis present

## 2021-02-17 DIAGNOSIS — I272 Pulmonary hypertension, unspecified: Secondary | ICD-10-CM | POA: Diagnosis present

## 2021-02-17 DIAGNOSIS — Z841 Family history of disorders of kidney and ureter: Secondary | ICD-10-CM

## 2021-02-17 DIAGNOSIS — E039 Hypothyroidism, unspecified: Secondary | ICD-10-CM | POA: Diagnosis present

## 2021-02-17 DIAGNOSIS — R040 Epistaxis: Secondary | ICD-10-CM | POA: Diagnosis not present

## 2021-02-17 DIAGNOSIS — K91840 Postprocedural hemorrhage and hematoma of a digestive system organ or structure following a digestive system procedure: Secondary | ICD-10-CM | POA: Diagnosis not present

## 2021-02-17 DIAGNOSIS — K5730 Diverticulosis of large intestine without perforation or abscess without bleeding: Secondary | ICD-10-CM | POA: Diagnosis present

## 2021-02-17 DIAGNOSIS — K922 Gastrointestinal hemorrhage, unspecified: Secondary | ICD-10-CM | POA: Diagnosis present

## 2021-02-17 DIAGNOSIS — J690 Pneumonitis due to inhalation of food and vomit: Secondary | ICD-10-CM | POA: Diagnosis not present

## 2021-02-17 DIAGNOSIS — Y838 Other surgical procedures as the cause of abnormal reaction of the patient, or of later complication, without mention of misadventure at the time of the procedure: Secondary | ICD-10-CM | POA: Diagnosis not present

## 2021-02-17 DIAGNOSIS — R6520 Severe sepsis without septic shock: Secondary | ICD-10-CM | POA: Diagnosis not present

## 2021-02-17 DIAGNOSIS — I5042 Chronic combined systolic (congestive) and diastolic (congestive) heart failure: Secondary | ICD-10-CM | POA: Diagnosis present

## 2021-02-17 DIAGNOSIS — K5520 Angiodysplasia of colon without hemorrhage: Secondary | ICD-10-CM

## 2021-02-17 DIAGNOSIS — F17210 Nicotine dependence, cigarettes, uncomplicated: Secondary | ICD-10-CM | POA: Diagnosis present

## 2021-02-17 DIAGNOSIS — A419 Sepsis, unspecified organism: Secondary | ICD-10-CM | POA: Diagnosis not present

## 2021-02-17 DIAGNOSIS — Z20822 Contact with and (suspected) exposure to covid-19: Secondary | ICD-10-CM | POA: Diagnosis present

## 2021-02-17 DIAGNOSIS — I2510 Atherosclerotic heart disease of native coronary artery without angina pectoris: Secondary | ICD-10-CM | POA: Diagnosis present

## 2021-02-17 DIAGNOSIS — Y95 Nosocomial condition: Secondary | ICD-10-CM | POA: Diagnosis not present

## 2021-02-17 DIAGNOSIS — M109 Gout, unspecified: Secondary | ICD-10-CM | POA: Diagnosis present

## 2021-02-17 DIAGNOSIS — D62 Acute posthemorrhagic anemia: Secondary | ICD-10-CM | POA: Diagnosis present

## 2021-02-17 DIAGNOSIS — J189 Pneumonia, unspecified organism: Secondary | ICD-10-CM | POA: Diagnosis not present

## 2021-02-17 DIAGNOSIS — D65 Disseminated intravascular coagulation [defibrination syndrome]: Secondary | ICD-10-CM | POA: Diagnosis not present

## 2021-02-17 DIAGNOSIS — I10 Essential (primary) hypertension: Secondary | ICD-10-CM | POA: Diagnosis present

## 2021-02-17 DIAGNOSIS — A021 Salmonella sepsis: Secondary | ICD-10-CM | POA: Diagnosis not present

## 2021-02-17 DIAGNOSIS — J849 Interstitial pulmonary disease, unspecified: Secondary | ICD-10-CM | POA: Diagnosis not present

## 2021-02-17 DIAGNOSIS — F419 Anxiety disorder, unspecified: Secondary | ICD-10-CM | POA: Diagnosis present

## 2021-02-17 DIAGNOSIS — I5022 Chronic systolic (congestive) heart failure: Secondary | ICD-10-CM | POA: Diagnosis present

## 2021-02-17 LAB — PREPARE RBC (CROSSMATCH)

## 2021-02-17 LAB — APTT: aPTT: 31 seconds (ref 24–36)

## 2021-02-17 LAB — HEPATIC FUNCTION PANEL
ALT: 75 U/L — ABNORMAL HIGH (ref 0–44)
AST: 67 U/L — ABNORMAL HIGH (ref 15–41)
Albumin: 2.7 g/dL — ABNORMAL LOW (ref 3.5–5.0)
Alkaline Phosphatase: 66 U/L (ref 38–126)
Bilirubin, Direct: 0.3 mg/dL — ABNORMAL HIGH (ref 0.0–0.2)
Indirect Bilirubin: 0.4 mg/dL (ref 0.3–0.9)
Total Bilirubin: 0.7 mg/dL (ref 0.3–1.2)
Total Protein: 5.8 g/dL — ABNORMAL LOW (ref 6.5–8.1)

## 2021-02-17 LAB — COMPREHENSIVE METABOLIC PANEL
ALT: 85 U/L — ABNORMAL HIGH (ref 0–44)
AST: 70 U/L — ABNORMAL HIGH (ref 15–41)
Albumin: 2.8 g/dL — ABNORMAL LOW (ref 3.5–5.0)
Alkaline Phosphatase: 80 U/L (ref 38–126)
Anion gap: 8 (ref 5–15)
BUN: 73 mg/dL — ABNORMAL HIGH (ref 8–23)
CO2: 18 mmol/L — ABNORMAL LOW (ref 22–32)
Calcium: 8.3 mg/dL — ABNORMAL LOW (ref 8.9–10.3)
Chloride: 113 mmol/L — ABNORMAL HIGH (ref 98–111)
Creatinine, Ser: 2.86 mg/dL — ABNORMAL HIGH (ref 0.61–1.24)
GFR, Estimated: 24 mL/min — ABNORMAL LOW (ref >60)
Glucose, Bld: 109 mg/dL — ABNORMAL HIGH (ref 70–99)
Potassium: 4.4 mmol/L (ref 3.5–5.1)
Sodium: 139 mmol/L (ref 135–145)
Total Bilirubin: 0.3 mg/dL (ref 0.3–1.2)
Total Protein: 6.2 g/dL — ABNORMAL LOW (ref 6.5–8.1)

## 2021-02-17 LAB — PROTIME-INR
INR: 1.2 (ref 0.8–1.2)
Prothrombin Time: 14.7 seconds (ref 11.4–15.2)

## 2021-02-17 LAB — HEMOGLOBIN AND HEMATOCRIT, BLOOD
HCT: 19.7 % — ABNORMAL LOW (ref 39.0–52.0)
Hemoglobin: 6.1 g/dL — CL (ref 13.0–17.0)

## 2021-02-17 LAB — RESP PANEL BY RT-PCR (FLU A&B, COVID) ARPGX2
Influenza A by PCR: NEGATIVE
Influenza B by PCR: NEGATIVE
SARS Coronavirus 2 by RT PCR: NEGATIVE

## 2021-02-17 LAB — TROPONIN I (HIGH SENSITIVITY)
Troponin I (High Sensitivity): 21 ng/L — ABNORMAL HIGH (ref <18)
Troponin I (High Sensitivity): 23 ng/L — ABNORMAL HIGH (ref <18)

## 2021-02-17 LAB — BRAIN NATRIURETIC PEPTIDE: B Natriuretic Peptide: 95.9 pg/mL (ref 0.0–100.0)

## 2021-02-17 IMAGING — DX DG CHEST 2V
2 series · 2 of 2 positions shown · non-contrast
Comparison: Chest radiograph dated [DATE] and CT chest dated
[DATE].

CLINICAL DATA: Shortness of breath

EXAM:
CHEST - 2 VIEW

[chest pa]
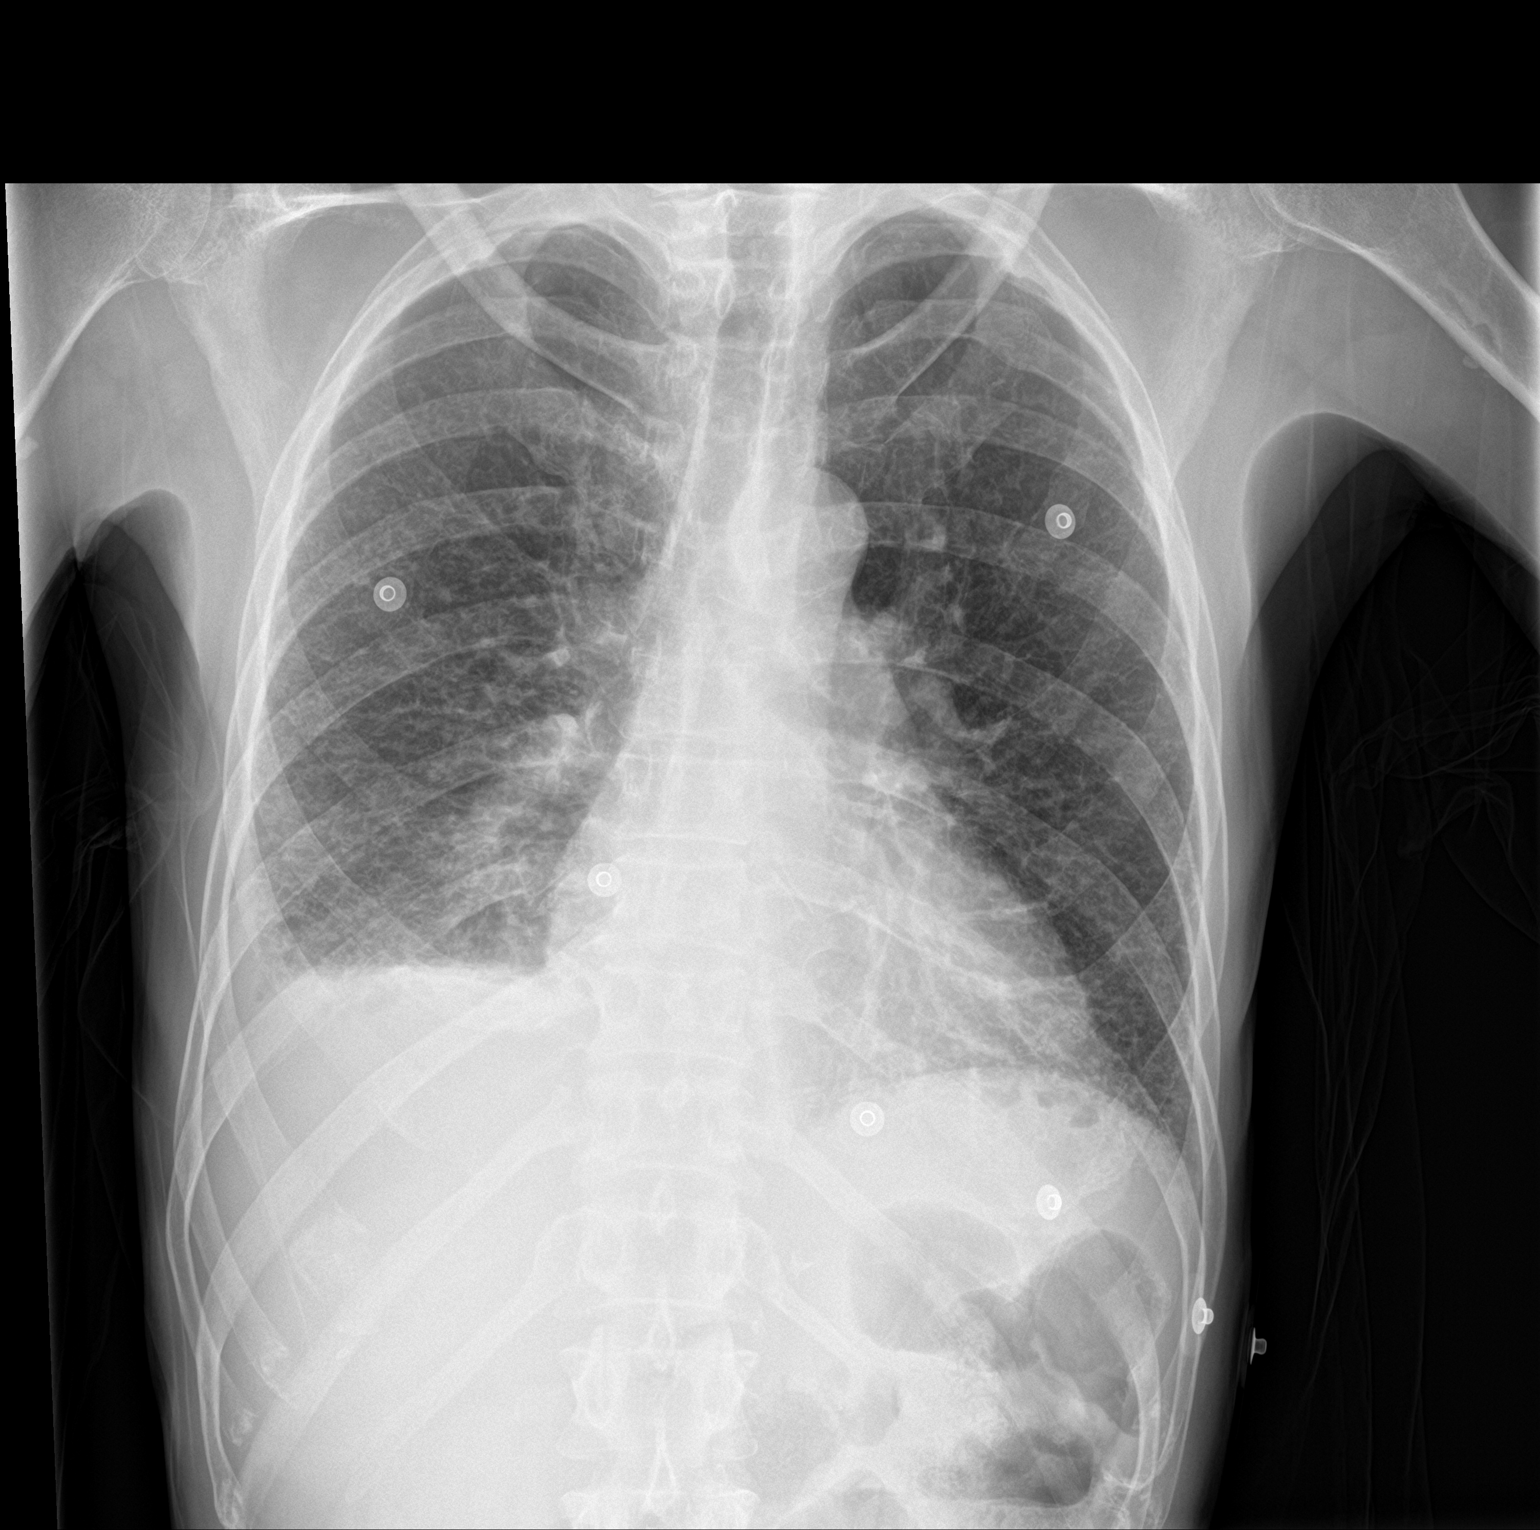

[chest lat]
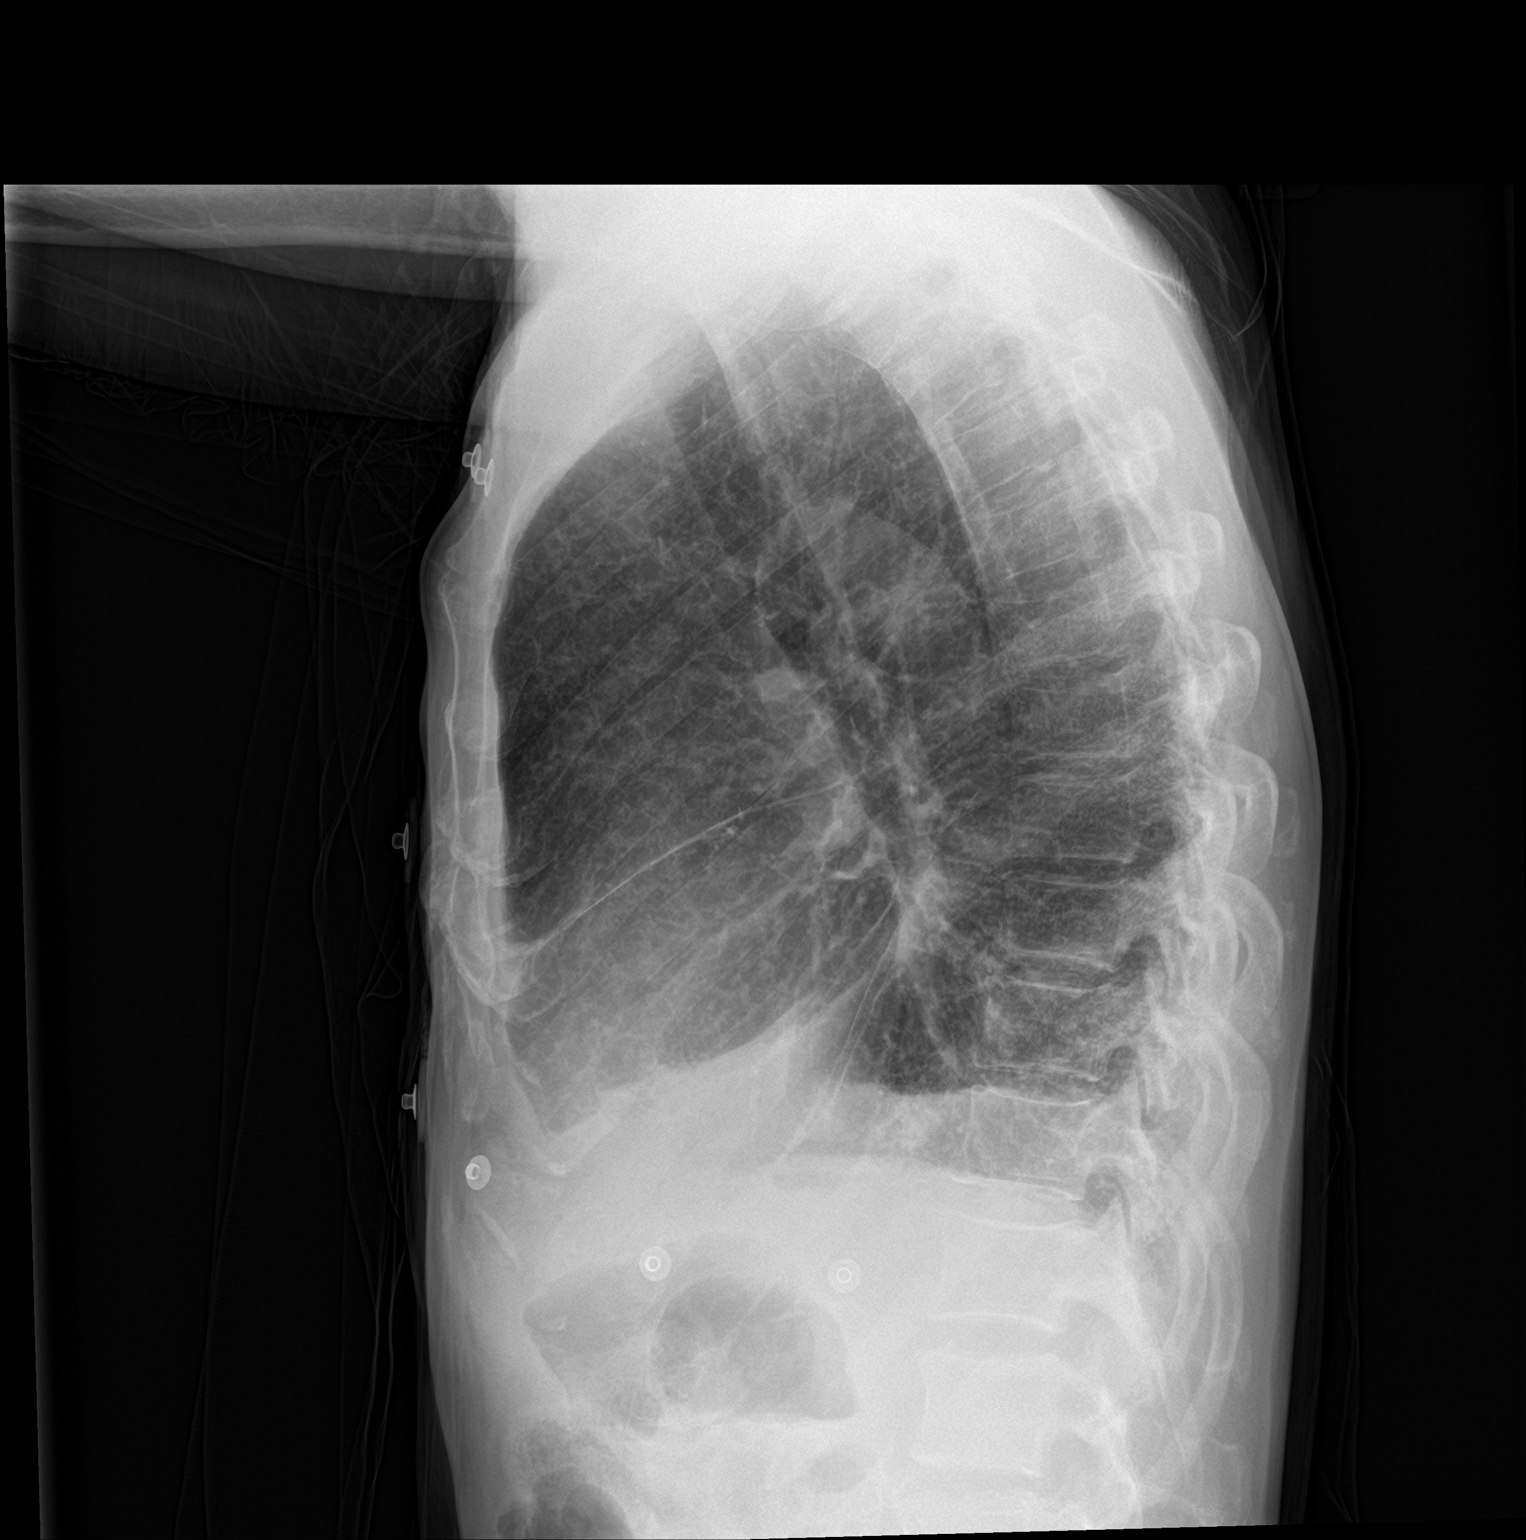

[2 of 2 positions shown; findings below may reference images not displayed]

FINDINGS: The heart size and mediastinal contours are within normal limits.
Vascular calcifications are seen in the aortic arch. There is a
small right pleural effusion with associated atelectasis. Mild
bibasilar reticular opacities are consistent with a chronic process.
There is no left pleural effusion. There is no pneumothorax.
Degenerative changes are seen in the spine.
IMPRESSION: Small right pleural effusion with associated atelectasis.

Aortic Atherosclerosis ([JF]-[JF]).

## 2021-02-17 MED ORDER — SODIUM CHLORIDE 0.9 % IV SOLN: INTRAVENOUS | Status: AC

## 2021-02-17 MED ORDER — ALPRAZOLAM 0.25 MG PO TABS
0.2500 mg | ORAL_TABLET | Freq: Two times a day (BID) | ORAL | Status: DC | PRN
  Administered 2021-02-17 – 2021-02-19 (×3): 0.25 mg via ORAL
  Filled 2021-02-17 (×3): qty 1

## 2021-02-17 MED ORDER — ACETAMINOPHEN 325 MG PO TABS
650.0000 mg | ORAL_TABLET | Freq: Four times a day (QID) | ORAL | Status: DC | PRN
  Administered 2021-02-19: 650 mg via ORAL
  Filled 2021-02-17: qty 2

## 2021-02-17 MED ORDER — HYDROCODONE-ACETAMINOPHEN 10-325 MG PO TABS: 0.5000 | ORAL_TABLET | Freq: Four times a day (QID) | ORAL | Status: DC | PRN

## 2021-02-17 MED ORDER — ACETAMINOPHEN 650 MG RE SUPP: 650.0000 mg | Freq: Four times a day (QID) | RECTAL | Status: DC | PRN

## 2021-02-17 MED ORDER — FUROSEMIDE 10 MG/ML IJ SOLN
20.0000 mg | Freq: Once | INTRAMUSCULAR | Status: AC
  Administered 2021-02-17: 20 mg via INTRAVENOUS
  Filled 2021-02-17: qty 2

## 2021-02-17 MED ORDER — MACITENTAN 10 MG PO TABS
10.0000 mg | ORAL_TABLET | Freq: Every day | ORAL | Status: DC
  Administered 2021-02-18 – 2021-02-21 (×4): 10 mg via ORAL
  Filled 2021-02-17 (×4): qty 1

## 2021-02-17 MED ORDER — PANTOPRAZOLE SODIUM 40 MG PO TBEC
40.0000 mg | DELAYED_RELEASE_TABLET | Freq: Every day | ORAL | Status: DC
  Administered 2021-02-18: 40 mg via ORAL
  Filled 2021-02-17: qty 1

## 2021-02-17 MED ORDER — HYDROMORPHONE HCL 1 MG/ML IJ SOLN: 0.5000 mg | INTRAMUSCULAR | Status: DC | PRN

## 2021-02-17 MED ORDER — ROSUVASTATIN CALCIUM 20 MG PO TABS
20.0000 mg | ORAL_TABLET | Freq: Every day | ORAL | Status: DC
  Administered 2021-02-18 – 2021-02-21 (×4): 20 mg via ORAL
  Filled 2021-02-17 (×4): qty 1

## 2021-02-17 MED ORDER — SILDENAFIL CITRATE 20 MG PO TABS
40.0000 mg | ORAL_TABLET | Freq: Three times a day (TID) | ORAL | Status: DC
  Administered 2021-02-17 – 2021-02-21 (×8): 40 mg via ORAL
  Filled 2021-02-17 (×15): qty 2

## 2021-02-17 MED ORDER — KETOROLAC TROMETHAMINE 30 MG/ML IJ SOLN
30.0000 mg | Freq: Four times a day (QID) | INTRAMUSCULAR | Status: DC | PRN
  Filled 2021-02-17: qty 1

## 2021-02-17 MED ORDER — ALLOPURINOL 100 MG PO TABS
100.0000 mg | ORAL_TABLET | Freq: Every day | ORAL | Status: DC
  Administered 2021-02-18 – 2021-02-21 (×4): 100 mg via ORAL
  Filled 2021-02-17 (×4): qty 1

## 2021-02-17 MED ORDER — SODIUM CHLORIDE 0.9 % IV SOLN: 10.0000 mL/h | Freq: Once | INTRAVENOUS | Status: DC

## 2021-02-17 MED ORDER — GUAIFENESIN ER 600 MG PO TB12: 600.0000 mg | ORAL_TABLET | Freq: Two times a day (BID) | ORAL | Status: DC | PRN

## 2021-02-17 MED ORDER — MACITENTAN 10 MG PO TABS: 10.0000 mg | ORAL_TABLET | Freq: Every day | ORAL | Status: DC

## 2021-02-17 MED ORDER — TORSEMIDE 20 MG PO TABS
20.0000 mg | ORAL_TABLET | Freq: Every day | ORAL | Status: DC
  Administered 2021-02-18: 20 mg via ORAL
  Filled 2021-02-17: qty 1

## 2021-02-17 MED ORDER — POTASSIUM CHLORIDE CRYS ER 20 MEQ PO TBCR
20.0000 meq | EXTENDED_RELEASE_TABLET | Freq: Every day | ORAL | Status: DC
  Administered 2021-02-18 – 2021-02-21 (×4): 20 meq via ORAL
  Filled 2021-02-17 (×4): qty 1

## 2021-02-17 NOTE — ED Provider Notes (Signed)
Emergency Medicine Provider Triage Evaluation Note  [name] , Skinner 60 y.o. male  was evaluated in triage.  Pt complains of shortness of breath.  Began earlier today.  He has chronic dyspnea on exertion however states this feels worse.  Family in room helps to provide history.  Patient started with bright red blood per rectum earlier today.  Having multiple episodes of bloody stools with clots.  He is followed by Dr. Bryan Lemma for this.  Has needed transfusions previously.  Known history of AVM.  Denies any abdominal pain, anticoagulation, chronic NSAID use or EtOH use.  Does state he feels some heaviness in his chest which she states has been there for some time however cannot say when this began.  He admits to prior history of heart failure, CKD.  Patient feels lightheaded with ambulating as well as significantly short of breath.    Review of Systems  Positive: Bloody stool, shortness of breath, chest tightness, lightheadedness, weakness Negative: Back pain, abdominal pain, numbness  Physical Exam  BP 126/68 (BP Location: Left Arm)   Pulse (!) 107   Temp 98.8 F (37.1 C) (Oral)   Resp 18   SpO2 91%  Gen:   Awake, no distress   Resp:  Normal effort  MSK:   Moves extremities without difficulty, no lower extremity edema ABD:  Soft, nontender Other:    Medical Decision Making  Medically screening exam initiated at 12:39 PM.  Appropriate orders placed.  [name] was informed that the remainder of the evaluation will be completed by another provider, this initial triage assessment does not replace that evaluation, and the importance of remaining in the ED until their evaluation is complete.  Patient with shortness of breath, bloody stools as well as some chest heaviness with unknown timing of his chest pain.  Will add on labs, imaging.  Highly suspect GI bleed as cause of his symptoms  Mildly tachycardic to 107 however no hypotension.    Cody Menor A, PA-C 02/17/21  1248    Cody Boss, MD 02/18/21 1144
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
## 2021-02-17 NOTE — ED Notes (Signed)
ED Provider at bedside. 

## 2021-02-17 NOTE — Progress Notes (Signed)
Received report from [place] , [place]. Admited to 5w-25,. Alert and oriented person/place/time/situation. Denies abdominal pain or nausea. No active bloody stool at this time. 1st unit of blood transfusing. VS all WNL

## 2021-02-17 NOTE — ED Notes (Addendum)
Unsuccessful IV attempted placement. Blood was obtained for lab. Patient stated he did not want a 2nd attempt by "any RN". IV team ordered for placement, PRBC's ordered.

## 2021-02-17 NOTE — Progress Notes (Signed)
Pharmacy Consult for Pulmonary Hypertension Treatment   Indication - Continuation of prior to admission medication   Patient is 60 y.o.  with history of PAH on chronic Macitentan (Opsumit) PTA and will be continued while hospitalized.   Continuing this medication order as an inpatient requires that monitoring parameters per REMS requirements must be met.  Chronic therapy is under the supervision of Dr. Haroldine Laws who is enrolled in the REMS program and is being notified of continuation of therapy. A staff message in EPIC has been sent notifying the certified prescriber.  Per patient report has previously been educated on Hepatotoxicity . On admission pregnancy risk has been assessed and no monitoring required. Hepatic function has been evaluated. AST/ALT appropriate to continue medication at this time.  Hepatic Function Latest Ref Rng & Units 02/17/2021 02/15/2021 02/02/2021  Total Protein 6.5 - 8.1 g/dL 6.2(L) - 6.8  Albumin 3.5 - 5.0 g/dL 2.8(L) 2.9(L) -  AST 15 - 41 U/L 70(H) - 49(H)  ALT 0 - 44 U/L 85(H) - 46  Alk Phosphatase 38 - 126 U/L 80 - -  Total Bilirubin 0.3 - 1.2 mg/dL 0.3 - 0.3  Bilirubin, Direct 0.0 - 0.2 mg/dL - - -   If any question arise or pregnancy is identified during hospitalization, contact for bosentan and macitentan: [phone]; ambrisentan: [phone].  Thank for you allowing [place] to participate in the care of this patient.  Joetta Manners, PharmD, [facility] Medicine Clinical Pharmacist ED RPh Phone: [place]: [phone]

## 2021-02-17 NOTE — Telephone Encounter (Signed)
Received a call from patient's spouse. She called in with concerns because patient has been bleeding rectally and experiencing SOB. She was upset because she is at the ED and they just placed patient back in the lobby. Advised that if he is actively bleeding he will need to stay in the ED for evaluation, advised wife to discus with the staff again her concerns and patient's significant medical history. Pt has been scheduled for a hospital follow up with Dr. Bryan Lemma onTuesday, 02/22/21 at 2:40 pm. Wife is aware that this appt is at the [facility] office. Pt's wife verbalized understanding and had no concerns at the end of the call.
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
## 2021-02-17 NOTE — ED Notes (Signed)
Provider at bedside.

## 2021-02-17 NOTE — ED Triage Notes (Signed)
Patient here for evaluation of black tarry stools that started today. Denies abdominal pain, denies nausea and emesis. Patient does not take a blood thinner. Patient is alert, oriented, and in no apparent distress at this time.

## 2021-02-18 ENCOUNTER — Other Ambulatory Visit: Payer: Self-pay

## 2021-02-18 ENCOUNTER — Encounter (HOSPITAL_COMMUNITY): Payer: Self-pay | Admitting: Internal Medicine

## 2021-02-18 DIAGNOSIS — K31819 Angiodysplasia of stomach and duodenum without bleeding: Secondary | ICD-10-CM

## 2021-02-18 DIAGNOSIS — N183 Chronic kidney disease, stage 3 unspecified: Secondary | ICD-10-CM

## 2021-02-18 DIAGNOSIS — K922 Gastrointestinal hemorrhage, unspecified: Principal | ICD-10-CM

## 2021-02-18 DIAGNOSIS — D62 Acute posthemorrhagic anemia: Secondary | ICD-10-CM

## 2021-02-18 DIAGNOSIS — K5520 Angiodysplasia of colon without hemorrhage: Secondary | ICD-10-CM

## 2021-02-18 LAB — BPAM RBC
Blood Product Expiration Date: 202211172359
Blood Product Expiration Date: 202211172359
ISSUE DATE / TIME: 202210271759
ISSUE DATE / TIME: 202210272127
Unit Type and Rh: 6200
Unit Type and Rh: 6200

## 2021-02-18 LAB — CBC WITH DIFFERENTIAL/PLATELET
Abs Immature Granulocytes: 0.03 10*3/uL (ref 0.00–0.07)
Basophils Absolute: 0 10*3/uL (ref 0.0–0.1)
Basophils Relative: 0 %
Eosinophils Absolute: 0 10*3/uL (ref 0.0–0.5)
Eosinophils Relative: 0 %
HCT: 21.7 % — ABNORMAL LOW (ref 39.0–52.0)
Hemoglobin: 6.6 g/dL — CL (ref 13.0–17.0)
Immature Granulocytes: 1 %
Lymphocytes Relative: 18 %
Lymphs Abs: 0.9 10*3/uL (ref 0.7–4.0)
MCH: 30.4 pg (ref 26.0–34.0)
MCHC: 30.4 g/dL (ref 30.0–36.0)
MCV: 100 fL (ref 80.0–100.0)
Monocytes Absolute: 0.3 10*3/uL (ref 0.1–1.0)
Monocytes Relative: 7 %
Neutro Abs: 3.8 10*3/uL (ref 1.7–7.7)
Neutrophils Relative %: 74 %
Platelets: 169 10*3/uL (ref 150–400)
RBC: 2.17 MIL/uL — ABNORMAL LOW (ref 4.22–5.81)
RDW: 17.6 % — ABNORMAL HIGH (ref 11.5–15.5)
WBC: 5.1 10*3/uL (ref 4.0–10.5)
nRBC: 0 % (ref 0.0–0.2)

## 2021-02-18 LAB — CBC
HCT: 30.9 % — ABNORMAL LOW (ref 39.0–52.0)
HCT: 32.6 % — ABNORMAL LOW (ref 39.0–52.0)
Hemoglobin: 9.9 g/dL — ABNORMAL LOW (ref 13.0–17.0)
Hemoglobin: 10.7 g/dL — ABNORMAL LOW (ref 13.0–17.0)
MCH: 30.1 pg (ref 26.0–34.0)
MCH: 30.5 pg (ref 26.0–34.0)
MCHC: 32 g/dL (ref 30.0–36.0)
MCHC: 32.8 g/dL (ref 30.0–36.0)
MCV: 93.9 fL (ref 80.0–100.0)
MCV: 92.9 fL (ref 80.0–100.0)
Platelets: 169 10*3/uL (ref 150–400)
Platelets: 184 10*3/uL (ref 150–400)
RBC: 3.29 MIL/uL — ABNORMAL LOW (ref 4.22–5.81)
RBC: 3.51 MIL/uL — ABNORMAL LOW (ref 4.22–5.81)
RDW: 18.7 % — ABNORMAL HIGH (ref 11.5–15.5)
RDW: 19 % — ABNORMAL HIGH (ref 11.5–15.5)
WBC: 5.7 10*3/uL (ref 4.0–10.5)
WBC: 7 10*3/uL (ref 4.0–10.5)
nRBC: 0 % (ref 0.0–0.2)
nRBC: 0 % (ref 0.0–0.2)

## 2021-02-18 LAB — TYPE AND SCREEN
ABO/RH(D): A POS
Antibody Screen: NEGATIVE
Unit division: 0
Unit division: 0

## 2021-02-18 LAB — HEMOGLOBIN AND HEMATOCRIT, BLOOD
HCT: 29.7 % — ABNORMAL LOW (ref 39.0–52.0)
Hemoglobin: 9.6 g/dL — ABNORMAL LOW (ref 13.0–17.0)

## 2021-02-18 MED ORDER — PEG-KCL-NACL-NASULF-NA ASC-C 100 G PO SOLR
0.5000 | Freq: Once | ORAL | Status: AC
  Administered 2021-02-18: 100 g via ORAL
  Filled 2021-02-18 (×2): qty 1

## 2021-02-18 MED ORDER — PEG-KCL-NACL-NASULF-NA ASC-C 100 G PO SOLR: 1.0000 | Freq: Once | ORAL | Status: DC

## 2021-02-18 MED ORDER — BISACODYL 5 MG PO TBEC
20.0000 mg | DELAYED_RELEASE_TABLET | Freq: Once | ORAL | Status: AC
  Administered 2021-02-18: 20 mg via ORAL
  Filled 2021-02-18: qty 4

## 2021-02-18 MED ORDER — TORSEMIDE 20 MG PO TABS
10.0000 mg | ORAL_TABLET | Freq: Every day | ORAL | Status: DC
  Administered 2021-02-19: 10 mg via ORAL
  Filled 2021-02-18: qty 1

## 2021-02-18 MED ORDER — PEG-KCL-NACL-NASULF-NA ASC-C 100 G PO SOLR
0.5000 | Freq: Once | ORAL | Status: AC
  Administered 2021-02-18: 100 g via ORAL
  Filled 2021-02-18: qty 1

## 2021-02-18 MED ORDER — METOCLOPRAMIDE HCL 5 MG/ML IJ SOLN
10.0000 mg | Freq: Four times a day (QID) | INTRAMUSCULAR | Status: AC
  Administered 2021-02-18 (×2): 10 mg via INTRAVENOUS
  Filled 2021-02-18 (×2): qty 2
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
## 2021-02-18 NOTE — Progress Notes (Signed)
PROGRESS NOTE                                                                                                                                                                                                             Patient Demographics:    [name], is a 60 y.o. male, DOB - 02-06-1961, ILN:[id number]  Outpatient Primary MD for the patient is [name], Ravisankar, MD    LOS - 1  Admit date - 02/17/2021    Chief Complaint  Patient presents with   GI Bleeding       Brief Narrative (HPI from H&P) -  [name] is a 60 y.o. male with medical history significant of Scleroderma, CAD, CKD, HTN, HFpEF, AVMs GI tract with prior h/o needing transfusion. Earlier on the day of admission patient had increased DOE and also started passing bright red blod with clots per rectum, in the ER he was found to have severe anemia requiring transfusion and was admitted for GI bleed work-up.   Subjective:    [name] today has, No headache, No chest pain, No abdominal pain - No Nausea, No new weakness tingling or numbness, no SOB.   Assessment  & Plan :     Acute lower GI bleed induced acute blood loss anemia with symptoms - he is s/p 2 units of packed RBC transfusion on 02/17/2021, posttransfusion H&H will be monitored closely, on oral PPI, clear liquids, EGD colonoscopy on 02/20/2019 2 GI on board.  Continue to monitor H&H closely.  He has history of GI AVMs with bleeding in the past.  2.  CKD 3B.  Creatinine close to baseline.  3.  Chronic diastolic heart failure EF 55%.  Currently compensated.  Continue low-dose diuretic and monitor.  4.  History of gout.  On allopurinol.  5.  Dyslipidemia.  On statin.  6.  History of pulmonary hypertension with Scleroderma.  On home medications along with supportive care.      Condition -  Guarded  Family Communication  :  None present  Code Status :  Full  Consults  :   GI  PUD Prophylaxis : PPI   Procedures  :     EGD -   Colonoscopy -       Disposition Plan  :    Status is: Inpatient  Remains inpatient  appropriate because: GI Bleed workup  DVT Prophylaxis  :    SCDs Start: 02/17/21 1731    Lab Results  Component Value Date   PLT 169 02/17/2021    Diet :  Diet Order             Diet NPO time specified  Diet effective 0500 tomorrow           Diet clear liquid Room service appropriate? Yes; Fluid consistency: Thin  Diet effective now                    Inpatient Medications  Scheduled Meds:  allopurinol  100 mg Oral Daily   bisacodyl  20 mg Oral Once   macitentan  10 mg Oral Daily   metoCLOPramide (REGLAN) injection  10 mg Intravenous Q6H   pantoprazole  40 mg Oral Daily   peg 3350 powder  1 kit Oral Once   potassium chloride SA  20 mEq Oral Daily   rosuvastatin  20 mg Oral Daily   sildenafil  40 mg Oral TID   [START ON 02/19/2021] torsemide  10 mg Oral Daily   Continuous Infusions: PRN Meds:.acetaminophen **OR** acetaminophen, ALPRAZolam, guaiFENesin, ketorolac  Antibiotics  :    Anti-infectives (From admission, onward)    None        Time Spent in minutes  30   Lala Lund M.D on 02/18/2021 at 12:17 PM  To page go to [email or web address]   [facility] -  Office  [phone]  See all Orders from today for further details    Objective:   Vitals:   02/18/21 0100 02/18/21 0349 02/18/21 0759 02/18/21 1150  BP:  110/70 101/66 113/69  Pulse:  64 63 62  Resp:  _0 Temp:  98 F (36.7 C) 97.7 F (36.5 C) 97.8 F (36.6 C)  TempSrc:  Oral Oral Oral  SpO2:  97% 99% 100%  Weight: 51 kg     Height: _1  (1.727 m)       Wt Readings from Last 3 Encounters:  02/18/21 51 kg  02/14/21 50.8 kg  01/24/21 49.9 kg     Intake/Output Summary (Last 24 hours) at 02/18/2021 1217 Last data filed at 02/17/2021 2350 Gross per 24 hour  Intake 1185 ml  Output --  Net 1185 ml     Physical  Exam  Awake Alert, No new F.N deficits, Normal affect [place].AT,PERRAL Supple Neck, No JVD,   Symmetrical Chest wall movement, Good air movement bilaterally, CTAB RRR,No Gallops,Rubs or new Murmurs,  +ve B.Sounds, Abd Soft, No tenderness,   No Cyanosis, Clubbing or edema,        Data Review:    CBC Recent Labs  Lab 02/15/21 1037 02/17/21 1330 02/17/21 1736 02/18/21 0109  WBC  --  5.1  --   --   HGB 8.8* 6.6* 6.1* 9.6*  HCT  --  21.7* 19.7* 29.7*  PLT  --  169  --   --   MCV  --  100.0  --   --   MCH  --  30.4  --   --   MCHC  --  30.4  --   --   RDW  --  17.6*  --   --   LYMPHSABS  --  0.9  --   --   MONOABS  --  0.3  --   --   EOSABS  --  0.0  --   --  BASOSABS  --  0.0  --   --     Recent Labs  Lab 02/15/21 1037 02/17/21 1330 02/17/21 1812  NA 139 139  --   K 4.5 4.4  --   CL 111 113*  --   CO2 18* 18*  --   GLUCOSE 80 109*  --   BUN 62* 73*  --   CREATININE 3.18* 2.86*  --   CALCIUM 8.3*  8.2* 8.3*  --   AST  --  70* 67*  ALT  --  85* 75*  ALKPHOS  --  80 66  BILITOT  --  0.3 0.7  ALBUMIN 2.9* 2.8* 2.7*  INR  --  1.2  --   BNP  --  95.9  --     ------------------------------------------------------------------------------------------------------------------ No results for input(s): CHOL, HDL, LDLCALC, TRIG, CHOLHDL, LDLDIRECT in the last 72 hours.  Lab Results  Component Value Date   HGBA1C 4.9 10/09/2019   ------------------------------------------------------------------------------------------------------------------ No results for input(s): TSH, T4TOTAL, T3FREE, THYROIDAB in the last 72 hours.  Invalid input(s): FREET3  Cardiac Enzymes No results for input(s): CKMB, TROPONINI, MYOGLOBIN in the last 168 hours.  Invalid input(s): CK ------------------------------------------------------------------------------------------------------------------    Component Value Date/Time   BNP 95.9 02/17/2021 1330   BNP 235.3 (H) 02/24/2016 1304     Radiology Reports DG Chest 2 View  Result Date: 02/17/2021 CLINICAL DATA:  Shortness of breath EXAM: CHEST - 2 VIEW COMPARISON:  Chest radiograph dated 11/08/2019 and CT chest dated 01/04/2021. FINDINGS: The heart size and mediastinal contours are within normal limits. Vascular calcifications are seen in the aortic arch. There is a small right pleural effusion with associated atelectasis. Mild bibasilar reticular opacities are consistent with a chronic process. There is no left pleural effusion. There is no pneumothorax. Degenerative changes are seen in the spine. IMPRESSION: Small right pleural effusion with associated atelectasis. Aortic Atherosclerosis (ICD10-I70.0). Electronically Signed   By: Zerita Boers M.D.   On: 02/17/2021 13:23

## 2021-02-19 ENCOUNTER — Inpatient Hospital Stay (HOSPITAL_COMMUNITY): Payer: Medicare Other | Admitting: Anesthesiology

## 2021-02-19 ENCOUNTER — Encounter (HOSPITAL_COMMUNITY): Payer: Self-pay | Admitting: Internal Medicine

## 2021-02-19 ENCOUNTER — Inpatient Hospital Stay (HOSPITAL_COMMUNITY): Payer: Medicare Other

## 2021-02-19 ENCOUNTER — Encounter (HOSPITAL_COMMUNITY)
Admission: EM | Disposition: A | Discharge status: Home / Self Care | Payer: Self-pay | Source: Home / Self Care | Attending: Internal Medicine

## 2021-02-19 DIAGNOSIS — D62 Acute posthemorrhagic anemia: Secondary | ICD-10-CM

## 2021-02-19 DIAGNOSIS — D125 Benign neoplasm of sigmoid colon: Secondary | ICD-10-CM

## 2021-02-19 DIAGNOSIS — A419 Sepsis, unspecified organism: Secondary | ICD-10-CM | POA: Diagnosis not present

## 2021-02-19 DIAGNOSIS — K922 Gastrointestinal hemorrhage, unspecified: Secondary | ICD-10-CM | POA: Diagnosis not present

## 2021-02-19 DIAGNOSIS — Z20822 Contact with and (suspected) exposure to covid-19: Secondary | ICD-10-CM | POA: Diagnosis not present

## 2021-02-19 DIAGNOSIS — J690 Pneumonitis due to inhalation of food and vomit: Secondary | ICD-10-CM | POA: Diagnosis not present

## 2021-02-19 HISTORY — PX: POLYPECTOMY: SHX5525

## 2021-02-19 HISTORY — PX: HEMOSTASIS CLIP PLACEMENT: SHX6857

## 2021-02-19 HISTORY — PX: COLONOSCOPY WITH PROPOFOL: SHX5780

## 2021-02-19 HISTORY — PX: HOT HEMOSTASIS: SHX5433

## 2021-02-19 HISTORY — PX: ESOPHAGOGASTRODUODENOSCOPY (EGD) WITH PROPOFOL: SHX5813

## 2021-02-19 LAB — COMPREHENSIVE METABOLIC PANEL
ALT: 72 U/L — ABNORMAL HIGH (ref 0–44)
AST: 69 U/L — ABNORMAL HIGH (ref 15–41)
Albumin: 2.8 g/dL — ABNORMAL LOW (ref 3.5–5.0)
Alkaline Phosphatase: 72 U/L (ref 38–126)
Anion gap: 12 (ref 5–15)
BUN: 74 mg/dL — ABNORMAL HIGH (ref 8–23)
CO2: 16 mmol/L — ABNORMAL LOW (ref 22–32)
Calcium: 8.5 mg/dL — ABNORMAL LOW (ref 8.9–10.3)
Chloride: 115 mmol/L — ABNORMAL HIGH (ref 98–111)
Creatinine, Ser: 3.01 mg/dL — ABNORMAL HIGH (ref 0.61–1.24)
GFR, Estimated: 23 mL/min — ABNORMAL LOW (ref >60)
Glucose, Bld: 55 mg/dL — ABNORMAL LOW (ref 70–99)
Potassium: 4.2 mmol/L (ref 3.5–5.1)
Sodium: 143 mmol/L (ref 135–145)
Total Bilirubin: 1 mg/dL (ref 0.3–1.2)
Total Protein: 5.9 g/dL — ABNORMAL LOW (ref 6.5–8.1)

## 2021-02-19 LAB — CBC WITH DIFFERENTIAL/PLATELET
Abs Immature Granulocytes: 0.25 10*3/uL — ABNORMAL HIGH (ref 0.00–0.07)
Basophils Absolute: 0 10*3/uL (ref 0.0–0.1)
Basophils Relative: 0 %
Eosinophils Absolute: 0 10*3/uL (ref 0.0–0.5)
Eosinophils Relative: 0 %
HCT: 24.3 % — ABNORMAL LOW (ref 39.0–52.0)
Hemoglobin: 8.1 g/dL — ABNORMAL LOW (ref 13.0–17.0)
Immature Granulocytes: 1 %
Lymphocytes Relative: 5 %
Lymphs Abs: 0.8 10*3/uL (ref 0.7–4.0)
MCH: 30.8 pg (ref 26.0–34.0)
MCHC: 33.3 g/dL (ref 30.0–36.0)
MCV: 92.4 fL (ref 80.0–100.0)
Monocytes Absolute: 0.9 10*3/uL (ref 0.1–1.0)
Monocytes Relative: 5 %
Neutro Abs: 15.4 10*3/uL — ABNORMAL HIGH (ref 1.7–7.7)
Neutrophils Relative %: 89 %
Platelets: 158 10*3/uL (ref 150–400)
RBC: 2.63 MIL/uL — ABNORMAL LOW (ref 4.22–5.81)
RDW: 18.1 % — ABNORMAL HIGH (ref 11.5–15.5)
WBC: 17.4 10*3/uL — ABNORMAL HIGH (ref 4.0–10.5)
nRBC: 0 % (ref 0.0–0.2)

## 2021-02-19 LAB — MAGNESIUM: Magnesium: 1.2 mg/dL — ABNORMAL LOW (ref 1.7–2.4)

## 2021-02-19 LAB — CBC
HCT: 30.3 % — ABNORMAL LOW (ref 39.0–52.0)
Hemoglobin: 9.7 g/dL — ABNORMAL LOW (ref 13.0–17.0)
MCH: 30.7 pg (ref 26.0–34.0)
MCHC: 32 g/dL (ref 30.0–36.0)
MCV: 95.9 fL (ref 80.0–100.0)
Platelets: 167 10*3/uL (ref 150–400)
RBC: 3.16 MIL/uL — ABNORMAL LOW (ref 4.22–5.81)
RDW: 18.5 % — ABNORMAL HIGH (ref 11.5–15.5)
WBC: 8.2 10*3/uL (ref 4.0–10.5)
nRBC: 0 % (ref 0.0–0.2)

## 2021-02-19 LAB — GLUCOSE, CAPILLARY
Glucose-Capillary: 47 mg/dL — ABNORMAL LOW (ref 70–99)
Glucose-Capillary: 92 mg/dL (ref 70–99)
Glucose-Capillary: 144 mg/dL — ABNORMAL HIGH (ref 70–99)
Glucose-Capillary: 73 mg/dL (ref 70–99)
Glucose-Capillary: 97 mg/dL (ref 70–99)

## 2021-02-19 LAB — PHOSPHORUS: Phosphorus: 3.9 mg/dL (ref 2.5–4.6)

## 2021-02-19 IMAGING — DX DG CHEST 1V PORT
1 series · 1 of 1 positions shown · non-contrast
Comparison: Radiograph 2 days ago [DATE], chest CT [DATE]

CLINICAL DATA: Sepsis.

EXAM:
PORTABLE CHEST 1 VIEW

[chest ap]
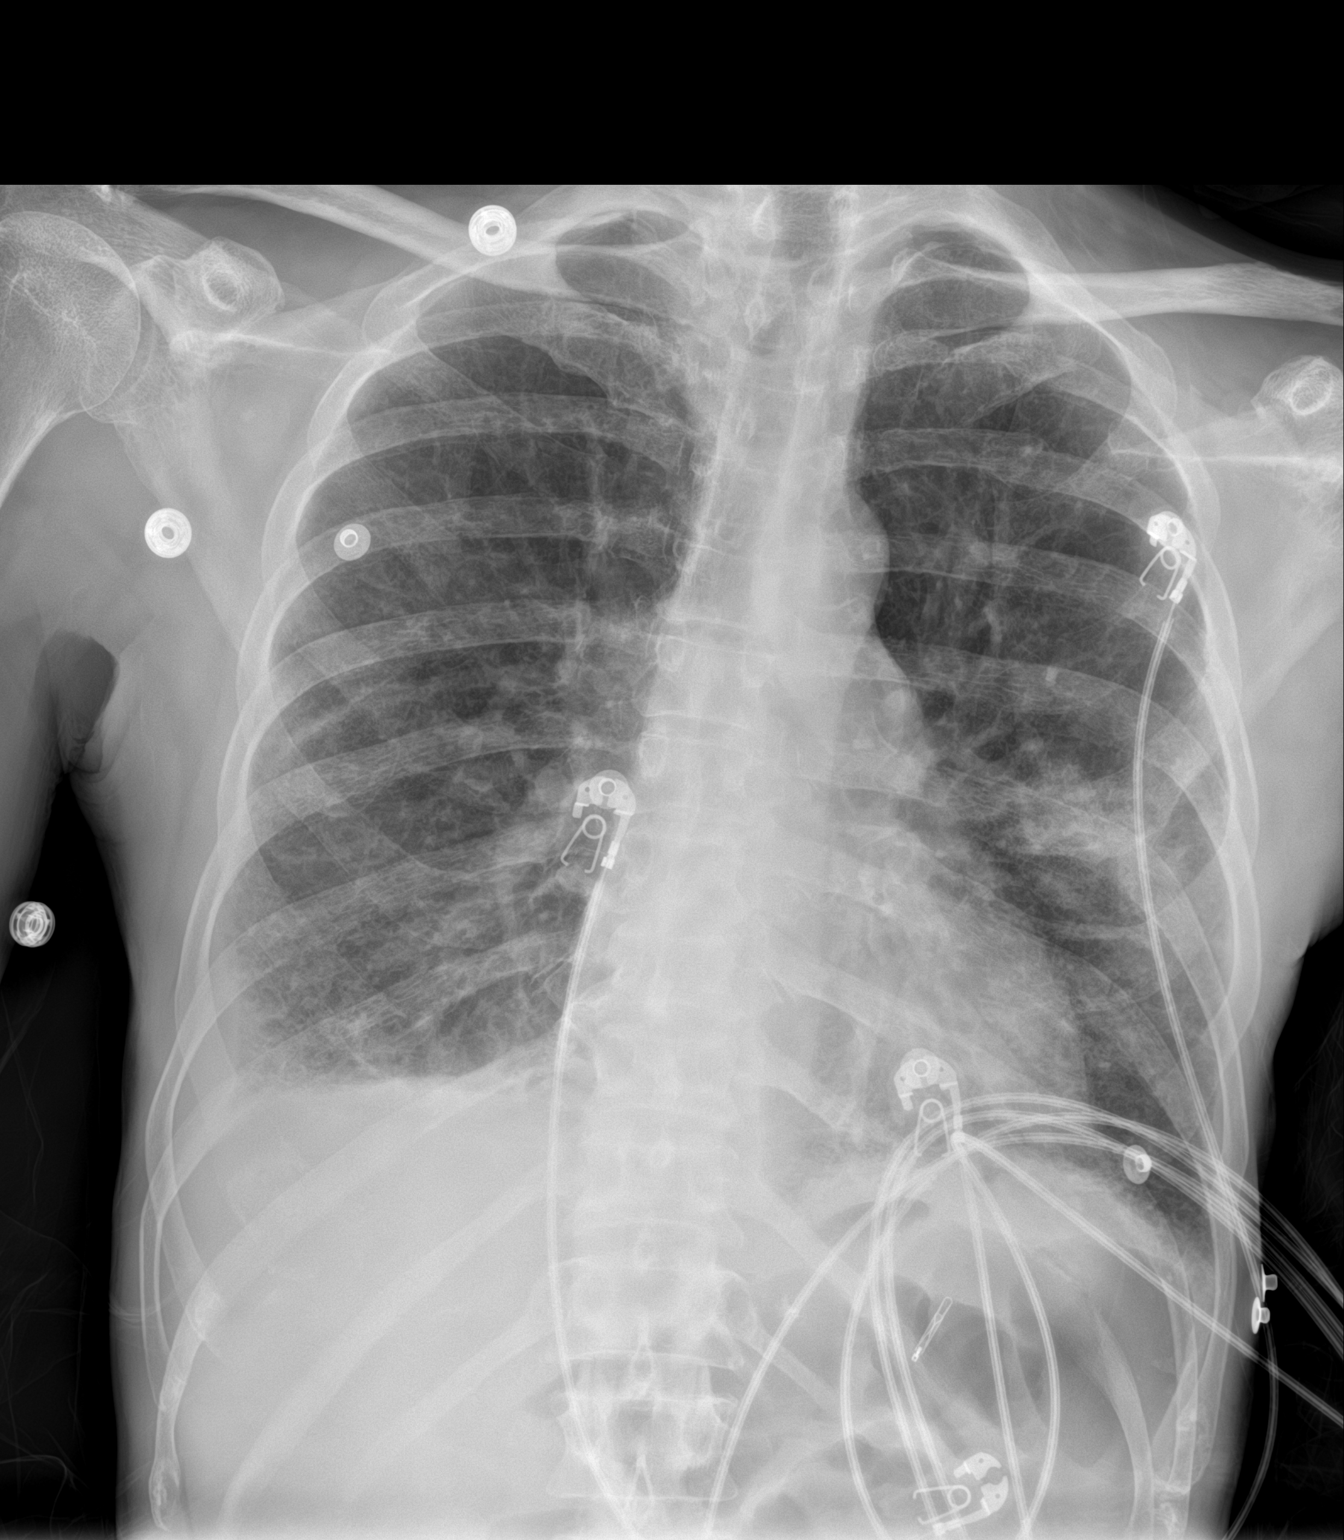

[1 of 1 positions shown; findings below may reference images not displayed]

FINDINGS: The heart is normal in size. Unchanged mediastinal contours. Right
pleural effusion has slightly increased from prior exam. Associated
basilar opacity. There is a new patchy opacity in the left mid lung.
Peripheral interstitial thickening which was better appreciated on
prior CT. No visualized left pleural effusion. No pneumothorax.
Stable osseous structures.
IMPRESSION: 1. Slight increase in size of right pleural effusion with associated
basilar opacity.
2. New patchy opacity in the left mid lung, suspicious for
pneumonia.
3. Interstitial lung disease previously characterized on
high-resolution chest CT.

## 2021-02-19 SURGERY — ESOPHAGOGASTRODUODENOSCOPY (EGD) WITH PROPOFOL
Anesthesia: Monitor Anesthesia Care

## 2021-02-19 MED ORDER — PHENYLEPHRINE 40 MCG/ML (10ML) SYRINGE FOR IV PUSH (FOR BLOOD PRESSURE SUPPORT)
PREFILLED_SYRINGE | INTRAVENOUS | Status: DC | PRN
  Administered 2021-02-19: 80 ug via INTRAVENOUS

## 2021-02-19 MED ORDER — PROPOFOL 500 MG/50ML IV EMUL
INTRAVENOUS | Status: DC | PRN
  Administered 2021-02-19: 125 ug/kg/min via INTRAVENOUS

## 2021-02-19 MED ORDER — MAGNESIUM SULFATE 2 GM/50ML IV SOLN
2.0000 g | Freq: Once | INTRAVENOUS | Status: AC
  Administered 2021-02-19: 2 g via INTRAVENOUS
  Filled 2021-02-19: qty 50

## 2021-02-19 MED ORDER — LACTATED RINGERS IV BOLUS
1000.0000 mL | Freq: Once | INTRAVENOUS | Status: AC
  Administered 2021-02-19: 1000 mL via INTRAVENOUS

## 2021-02-19 MED ORDER — NICOTINE 21 MG/24HR TD PT24
21.0000 mg | MEDICATED_PATCH | Freq: Every day | TRANSDERMAL | Status: DC
  Administered 2021-02-19 – 2021-02-20 (×2): 21 mg via TRANSDERMAL
  Filled 2021-02-19 (×3): qty 1

## 2021-02-19 MED ORDER — GLUCAGON HCL RDNA (DIAGNOSTIC) 1 MG IJ SOLR
INTRAMUSCULAR | Status: DC | PRN
  Administered 2021-02-19: .5 mg via INTRAVENOUS

## 2021-02-19 MED ORDER — DEXTROSE 50 % IV SOLN
25.0000 mL | Freq: Once | INTRAVENOUS | Status: AC
  Administered 2021-02-19: 25 mL via INTRAVENOUS
  Filled 2021-02-19: qty 50

## 2021-02-19 MED ORDER — MAGNESIUM SULFATE IN D5W 1-5 GM/100ML-% IV SOLN
1.0000 g | Freq: Once | INTRAVENOUS | Status: AC
  Administered 2021-02-19: 1 g via INTRAVENOUS
  Filled 2021-02-19: qty 100

## 2021-02-19 MED ORDER — PROPOFOL 10 MG/ML IV BOLUS
INTRAVENOUS | Status: DC | PRN
  Administered 2021-02-19 (×2): 20 mg via INTRAVENOUS
  Administered 2021-02-19: 10 mg via INTRAVENOUS
  Administered 2021-02-19: 20 mg via INTRAVENOUS

## 2021-02-19 MED ORDER — SODIUM CHLORIDE 0.9 % IV SOLN
INTRAVENOUS | Status: AC | PRN
  Administered 2021-02-19: 500 mL via INTRAVENOUS

## 2021-02-19 MED ORDER — PANTOPRAZOLE SODIUM 40 MG PO TBEC
40.0000 mg | DELAYED_RELEASE_TABLET | Freq: Two times a day (BID) | ORAL | Status: DC
  Administered 2021-02-19 – 2021-02-21 (×4): 40 mg via ORAL
  Filled 2021-02-19 (×4): qty 1

## 2021-02-19 MED ORDER — LIDOCAINE 2% (20 MG/ML) 5 ML SYRINGE
INTRAMUSCULAR | Status: DC | PRN
  Administered 2021-02-19: 40 mg via INTRAVENOUS

## 2021-02-19 NOTE — Progress Notes (Signed)
PT Cancellation Note  Patient Details Name: [name] MRN: [id number] DOB: Oct 08, 1960   Cancelled Treatment:    Reason Eval/Treat Not Completed: Patient at procedure or test/unavailable.  Pt is in endoscopy and will retry as time and pt allow.   Ramond Dial 02/19/2021, 9:53 AM  Mee Hives, PT PhD Acute Rehab Dept. Number: [place] and [place]

## 2021-02-19 NOTE — Op Note (Signed)
[facility] Patient Name: [name] Procedure Date : 02/19/2021 MRN: [id number] Attending MD: Gladstone Pih. 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 Candis Schatz, MD, Vista Lawman, RN, [facility] Technician, Technician Referring MD:              Medicines:                Monitored Anesthesia Care Complications:            No immediate complications. Estimated Blood Loss:     Estimated blood loss was minimal. Procedure:                Pre-Anesthesia Assessment:                           - Prior to the procedure, a History and Physical                            was performed, and patient medications and                            allergies were reviewed. [name] tolerance of                            previous anesthesia was also reviewed. The risks                            and benefits of the procedure and the sedation                            options and risks were discussed with the patient.                            All questions were answered, and informed consent                            was obtained. Prior Anticoagulants: The patient has                            taken no previous anticoagulant or antiplatelet  agents except for aspirin. ASA Grade Assessment:                            III - A patient with severe systemic disease. After                            reviewing the risks and benefits, the patient was                            deemed in satisfactory condition to undergo the                            procedure.                           - Prior to  the procedure, a History and Physical                            was performed, and patient medications and                            allergies were reviewed. [name] tolerance of                            previous anesthesia was also reviewed. The risks                            and benefits of the procedure and the sedation                            options and risks were discussed with the patient.                            All questions were answered, and informed consent                            was obtained. Prior Anticoagulants: The patient has                            taken no previous anticoagulant or antiplatelet                            agents except for aspirin. ASA Grade Assessment:                            III - A patient with severe systemic disease. After                            reviewing the risks and benefits, the patient was                            deemed in satisfactory condition to undergo the  procedure.                           After obtaining informed consent, the colonoscope                            was passed under direct vision. Throughout the                            procedure, the patient's blood pressure, pulse, and                            oxygen saturations were monitored continuously. The                            PCF-HQ190L ([id number] Olympus colonoscope was                            introduced through the anus and advanced to the the                            terminal ileum, with identification of the                            appendiceal orifice and IC valve. The colonoscopy                            was performed with moderate difficulty due to poor                            bowel prep, poor endoscopic visualization because                            of significant respiratory-associated motion of the                            abdomen and a tortuous colon. The patient tolerated                             the procedure well. The quality of the bowel                            preparation was poor. Scope In: 9:12:32 AM Scope Out: 9:50:18 AM Scope Withdrawal Time: 0 hours 28 minutes 15 seconds  Total Procedure Duration: 0 hours 37 minutes 46 seconds  Findings:      The perianal and digital rectal examinations were normal. Pertinent       negatives include normal sphincter tone and no palpable rectal lesions.      A moderate amount of liquid solid stool was found in the entire colon,       interfering with visualization.      Two medium-sized angioectasias without bleeding were found in the       descending colon and in the transverse colon. Coagulation for tissue       destruction using argon plasma at 0.5 liters/minute  and 20 watts was       successful. Estimated blood loss: none.      A 8 mm polyp was found in the sigmoid colon. The polyp was sessile. The       polyp was removed with a cold snare. Resection and retrieval were       complete. Estimated blood loss was minimal. The polypectomy was bleeding       more than expected immediately after polypectomy. To prevent further       bleeding after the polypectomy, one hemostatic clip was successfully       placed (MR conditional). There was no bleeding at the end of the       procedure.      A few small and large-mouthed diverticula were found in the sigmoid       colon.      The exam was otherwise normal throughout the examined colon.      The terminal ileum appeared normal.      The retroflexed view of the distal rectum and anal verge was normal and       showed no anal or rectal abnormalities. Impression:               - Preparation of the colon was poor.                           - Stool in the entire examined colon.                           - Two non-bleeding colonic angioectasias. Treated                            with argon plasma coagulation (APC).                           - One 8 mm polyp in the sigmoid colon,  removed with                            a cold snare. Resected and retrieved. Clip (MR                            conditional) was placed.                           - Diverticulosis in the sigmoid colon.                           - The examined portion of the ileum was normal.                           - The distal rectum and anal verge are normal on                            retroflexion view.                           - Poor prep limited endoscopic evaluation. There  were likely AVMs present in the colon. There was no                            evidence of bleeding or of recent hemorrhage. Recommendation:           - Return patient to hospital ward for ongoing care.                           - Resume previous diet.                           - Continue present medications.                           - Await pathology results.                           - Recommend following up with Dr. Bryan Lemma as an                            outpatient to discuss repeat colonoscopy as an                            outpatient for polyp surveillance. This colonoscopy                            was not adequate for polyp surveillance due to poor                            prep.                           - Observe in hospital for another 24 hrs and then                            discharge home if no evidence of further bleeding.                           - Recommend oral iron supplementation at discharge Procedure Code(s):        --- Professional ---                           [phone], Colonoscopy, flexible; with ablation of                            tumor(s), polyp(s), or other lesion(s) (includes                            pre- and post-dilation and guide wire passage, when                            performed)                           [id number], 59, Colonoscopy, flexible; with removal of  tumor(s), polyp(s), or other lesion(s) by snare                             technique Diagnosis Code(s):        --- Professional ---                           K55.20, Angiodysplasia of colon without hemorrhage                           K63.5, Polyp of colon                           K31.819, Angiodysplasia of stomach and duodenum                            without bleeding                           K57.30, Diverticulosis of large intestine without                            perforation or abscess without bleeding CPT copyright 2019 American Medical Association. All rights reserved. The codes documented in this report are preliminary and upon coder review may  be revised to meet current compliance requirements. Jerrit Horen E. Candis Schatz, MD 02/19/2021 10:30:39 AM This report has been signed electronically. Number of Addenda: 0

## 2021-02-19 NOTE — Anesthesia Preprocedure Evaluation (Signed)
Anesthesia Evaluation  Patient identified by MRN, date of birth, ID band Patient awake    Reviewed: Allergy & Precautions, H&P , NPO status , Patient's Chart, lab work & pertinent test results  Airway Mallampati: II   Neck ROM: full    Dental   Pulmonary asthma , Current Smoker,    breath sounds clear to auscultation       Cardiovascular hypertension, + CAD, + Past MI, + Cardiac Stents and +CHF   Rhythm:regular Rate:Normal  TTE (07/2020): EF 50-55%, normal valve function.   Neuro/Psych PSYCHIATRIC DISORDERS Anxiety    GI/Hepatic   Endo/Other  Hypothyroidism   Renal/GU Renal InsufficiencyRenal disease     Musculoskeletal   Abdominal   Peds  Hematology  (+) Blood dyscrasia, anemia ,   Anesthesia Other Findings   Reproductive/Obstetrics                             Anesthesia Physical Anesthesia Plan  ASA: 3  Anesthesia Plan: MAC   Post-op Pain Management:    Induction: Intravenous  PONV Risk Score and Plan: 0 and Propofol infusion and Treatment may vary due to age or medical condition  Airway Management Planned: Nasal Cannula  Additional Equipment:   Intra-op Plan:   Post-operative Plan:   Informed Consent: I have reviewed the patients History and Physical, chart, labs and discussed the procedure including the risks, benefits and alternatives for the proposed anesthesia with the patient or authorized representative who has indicated his/her understanding and acceptance.     Dental advisory given  Plan Discussed with: CRNA, Anesthesiologist and Surgeon  Anesthesia Plan Comments:         Anesthesia Quick Evaluation

## 2021-02-19 NOTE — Progress Notes (Signed)
PT Cancellation Note  Patient Details Name: [name] MRN: [id number] DOB: 23-Oct-1960   Cancelled Treatment:    Reason Eval/Treat Not Completed: Other (comment).  Pt was back from endoscopic procedure and per pt and family, doing fine with gait.  Nursing in to verify as well.  DC PT for now, and encouraged pt and family to let nursing know if they feel this has changed.   Ramond Dial 02/19/2021, 2:57 PM  Mee Hives, PT PhD Acute Rehab Dept. Number: [place] [facility] [place]

## 2021-02-19 NOTE — Progress Notes (Signed)
PROGRESS NOTE                                                                                                                                                                                                             Patient Demographics:    [name], is a 60 y.o. male, DOB - July 26, 1960, HQP:[id number]  Outpatient Primary MD for the patient is Skinner, Ravisankar, MD    LOS - 2  Admit date - 02/17/2021    Chief Complaint  Patient presents with   GI Bleeding       Brief Narrative (HPI from H&P) -  [name] is a 60 y.o. male with medical history significant of Scleroderma, CAD, CKD, HTN, HFpEF, AVMs GI tract with prior h/o needing transfusion. Earlier on the day of admission patient had increased DOE and also started passing bright red blod with clots per rectum, in the ER he was found to have severe anemia requiring transfusion and was admitted for GI bleed work-up.   Subjective:   Patient in bed, appears comfortable, denies any headache, no fever, no chest pain or pressure, no shortness of breath , no abdominal pain. No focal weakness.   Assessment  & Plan :     Acute lower GI bleed induced acute blood loss anemia with symptoms - he is s/p 2 units of packed RBC transfusion on 02/17/2021, posttransfusion H&H appears stable, no further signs of bleed on PPI, seen by GI underwent EGD and colonoscopy on 02/19/2021 each showing 2 nonbleeding angiodysplasias in stomach, duodenum and 2 in the colonic area all were APC'd, soft diet monitor H&H another 24 hours.  2.  CKD 3B.  Creatinine close to baseline.  3.  Chronic diastolic heart failure EF 55%.  Currently compensated.  Continue low-dose diuretic and monitor.  4.  History of gout.  On allopurinol.  5.  Dyslipidemia.  On statin.  6.  History of pulmonary hypertension with Scleroderma.  On home medications along with supportive care.      Condition -   Guarded  Family Communication  :  None present  Code Status :  Full  Consults  :  GI  PUD Prophylaxis : PPI   Procedures  :     EGD - 2 nonbleeding angiodysplasia noted in stomach and duodenum both underwent APC  Colonoscopy -  2 nonbleeding angiodysplasias in the colon both underwent APC      Disposition Plan  :    Status is: Inpatient  Remains inpatient appropriate because: GI Bleed workup  DVT Prophylaxis  :    SCDs Start: 02/17/21 1731    Lab Results  Component Value Date   PLT 184 02/18/2021    Diet :  Diet Order             DIET SOFT Room service appropriate? Yes; Fluid consistency: Thin  Diet effective now                    Inpatient Medications  Scheduled Meds:  allopurinol  100 mg Oral Daily   macitentan  10 mg Oral Daily   pantoprazole  40 mg Oral BID   potassium chloride SA  20 mEq Oral Daily   rosuvastatin  20 mg Oral Daily   sildenafil  40 mg Oral TID   torsemide  10 mg Oral Daily   Continuous Infusions: PRN Meds:.acetaminophen **OR** acetaminophen, ALPRAZolam, guaiFENesin, ketorolac  Antibiotics  :    Anti-infectives (From admission, onward)    None        Time Spent in minutes  30   Lala Lund M.D on 02/19/2021 at 11:16 AM  To page go to [email or web address]   [facility] -  Office  [phone]  See all Orders from today for further details    Objective:   Vitals:   02/19/21 0820 02/19/21 1004 02/19/21 1019 02/19/21 1038  BP: 124/69 102/72 119/70 130/76  Pulse: 87 84 77 75  Resp: _0 Temp: 98.5 F (36.9 C) (!) 97 F (36.1 C) (!) 97.5 F (36.4 C) 97.8 F (36.6 C)  TempSrc: Temporal   Oral  SpO2: 100% 100% 100% 100%  Weight:      Height:        Wt Readings from Last 3 Encounters:  02/18/21 51 kg  02/14/21 50.8 kg  01/24/21 49.9 kg     Intake/Output Summary (Last 24 hours) at 02/19/2021 1116 Last data filed at 02/19/2021 0950 Gross per 24 hour  Intake 1500 ml  Output 250 ml  Net  1250 ml     Physical Exam  Awake Alert, No new F.N deficits, Normal affect [place].AT,PERRAL Supple Neck, No JVD,   Symmetrical Chest wall movement, Good air movement bilaterally, CTAB RRR,No Gallops, Rubs or new Murmurs,  +ve B.Sounds, Abd Soft, No tenderness,   No Cyanosis, Clubbing or edema,        Data Review:    CBC Recent Labs  Lab 02/17/21 1330 02/17/21 1736 02/18/21 0109 02/18/21 0957 02/18/21 1924  WBC 5.1  --   --  5.7 7.0  HGB 6.6* 6.1* 9.6* 9.9* 10.7*  HCT 21.7* 19.7* 29.7* 30.9* 32.6*  PLT 169  --   --  169 184  MCV 100.0  --   --  93.9 92.9  MCH 30.4  --   --  30.1 30.5  MCHC 30.4  --   --  32.0 32.8  RDW 17.6*  --   --  18.7* 19.0*  LYMPHSABS 0.9  --   --   --   --   MONOABS 0.3  --   --   --   --   EOSABS 0.0  --   --   --   --   BASOSABS 0.0  --   --   --   --  Recent Labs  Lab 02/15/21 1037 02/17/21 1330 02/17/21 1812 02/19/21 0717  NA 139 139  --  143  K 4.5 4.4  --  4.2  CL 111 113*  --  115*  CO2 18* 18*  --  16*  GLUCOSE 80 109*  --  55*  BUN 62* 73*  --  74*  CREATININE 3.18* 2.86*  --  3.01*  CALCIUM 8.3*  8.2* 8.3*  --  8.5*  AST  --  70* 67* 69*  ALT  --  85* 75* 72*  ALKPHOS  --  80 66 72  BILITOT  --  0.3 0.7 1.0  ALBUMIN 2.9* 2.8* 2.7* 2.8*  MG  --   --   --  1.2*  INR  --  1.2  --   --   BNP  --  95.9  --   --     ------------------------------------------------------------------------------------------------------------------ No results for input(s): CHOL, HDL, LDLCALC, TRIG, CHOLHDL, LDLDIRECT in the last 72 hours.  Lab Results  Component Value Date   HGBA1C 4.9 10/09/2019   ------------------------------------------------------------------------------------------------------------------ No results for input(s): TSH, T4TOTAL, T3FREE, THYROIDAB in the last 72 hours.  Invalid input(s): FREET3  Cardiac Enzymes No results for input(s): CKMB, TROPONINI, MYOGLOBIN in the last 168 hours.  Invalid input(s):  CK ------------------------------------------------------------------------------------------------------------------    Component Value Date/Time   BNP 95.9 02/17/2021 1330   BNP 235.3 (H) 02/24/2016 1304    Radiology Reports DG Chest 2 View  Result Date: 02/17/2021 CLINICAL DATA:  Shortness of breath EXAM: CHEST - 2 VIEW COMPARISON:  Chest radiograph dated 11/08/2019 and CT chest dated 01/04/2021. FINDINGS: The heart size and mediastinal contours are within normal limits. Vascular calcifications are seen in the aortic arch. There is a small right pleural effusion with associated atelectasis. Mild bibasilar reticular opacities are consistent with a chronic process. There is no left pleural effusion. There is no pneumothorax. Degenerative changes are seen in the spine. IMPRESSION: Small right pleural effusion with associated atelectasis. Aortic Atherosclerosis (ICD10-I70.0). Electronically Signed   By: Zerita Boers M.D.   On: 02/17/2021 13:23

## 2021-02-19 NOTE — Anesthesia Postprocedure Evaluation (Signed)
Anesthesia Post Note  Patient: [name]  Procedure(s) Performed: ESOPHAGOGASTRODUODENOSCOPY (EGD) WITH PROPOFOL COLONOSCOPY WITH PROPOFOL HOT HEMOSTASIS (ARGON PLASMA COAGULATION/BICAP) POLYPECTOMY HEMOSTASIS CLIP PLACEMENT     Patient location during evaluation: Endoscopy Anesthesia Type: MAC Level of consciousness: awake and alert Pain management: pain level controlled Vital Signs Assessment: post-procedure vital signs reviewed and stable Respiratory status: spontaneous breathing, nonlabored ventilation, respiratory function stable and patient connected to nasal cannula oxygen Cardiovascular status: stable and blood pressure returned to baseline Postop Assessment: no apparent nausea or vomiting Anesthetic complications: no   No notable events documented.  Last Vitals:  Vitals:   02/19/21 1400 02/19/21 1500  BP:    Pulse:    Resp:    Temp: 36.6 C 36.4 C  SpO2:      Last Pain:  Vitals:   02/19/21 1500  TempSrc: Oral  PainSc:                  [place]

## 2021-02-19 NOTE — Interval H&P Note (Signed)
History and Physical Interval Note:  No acute events overnight.  Patient's globin stable after receiving 2 units PRBCs.  Patient denies evidence of overt GI bleeding this morning.  We will plan for upper and lower endoscopy with treatment of gastrointestinal AVMs.  02/19/2021 8:24 AM  [name]  has presented today for surgery, with the diagnosis of evaluate for source of gastrointestinal bleeding.  hematochezia.  hx AVMs.  blood loss anemia.  The various methods of treatment have been discussed with the patient and family. After consideration of risks, benefits and other options for treatment, the patient has consented to  Procedure(s): ESOPHAGOGASTRODUODENOSCOPY (EGD) WITH PROPOFOL (N/A) COLONOSCOPY WITH PROPOFOL (N/A) as a surgical intervention.  [name] history has been reviewed, patient examined, no change in status, stable for surgery.  I have reviewed the patient's chart and labs.  Questions were answered to the patient's satisfaction.     Daryel November

## 2021-02-19 NOTE — Transfer of Care (Signed)
Immediate Anesthesia Transfer of Care Note  Patient: [name]  Procedure(s) Performed: ESOPHAGOGASTRODUODENOSCOPY (EGD) WITH PROPOFOL COLONOSCOPY WITH PROPOFOL HOT HEMOSTASIS (ARGON PLASMA COAGULATION/BICAP) POLYPECTOMY  Patient Location: PACU  Anesthesia Type:MAC  Level of Consciousness: awake, alert  and oriented  Airway & Oxygen Therapy: Patient Spontanous Breathing  Post-op Assessment: Report given to RN and Post -op Vital signs reviewed and stable  Post vital signs: Reviewed and stable  Last Vitals:  Vitals Value Taken Time  BP 102/72 02/19/21 1004  Temp 36.1 C 02/19/21 1004  Pulse 84 02/19/21 1004  Resp 25 02/19/21 1007  SpO2 100 % 02/19/21 1004  Vitals shown include unvalidated device data.  Last Pain:  Vitals:   02/19/21 1004  TempSrc:   PainSc: 0-No pain         Complications: No notable events documented.

## 2021-02-19 NOTE — Significant Event (Addendum)
Pt with onset of SIRS and soft BPs: temp 100.81F; BP 90/59 MAP70; HR 127 RR20 100% RA.  1) hold lasix for tomorrow AM 2) 1L LR bolus now 3) check CBC, CMP, lactate, procalcitonin, blood cultures 4) check COVID and flu 5) tele monitor 6) tylenol for fever 7) CXR 8) UA

## 2021-02-19 NOTE — Op Note (Signed)
[facility] Patient Name: [name] Procedure Date : 02/19/2021 MRN: [id number] Attending MD: Gladstone Pih. 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 Candis Schatz, MD, Vista Lawman, RN, [facility] Technician, Technician Referring MD:              Medicines:                Monitored Anesthesia Care Complications:            No immediate complications. Estimated Blood Loss:     Estimated blood loss was minimal. Procedure:                Pre-Anesthesia Assessment:                           - Prior to the procedure, a History and Physical                            was performed, and patient medications and                            allergies were reviewed. [name] tolerance of                            previous anesthesia was also reviewed. The risks                            and benefits of the procedure and the sedation                            options and risks were discussed with the patient.                            All questions were answered, and informed consent                            was obtained. Prior Anticoagulants: The patient has                            taken no previous anticoagulant or antiplatelet  agents except for aspirin. ASA Grade Assessment:                            III - A patient with severe systemic disease. After                            reviewing the risks and benefits, the patient was                            deemed in satisfactory condition to undergo the                            procedure.                           After  obtaining informed consent, the endoscope was                            passed under direct vision. Throughout the                            procedure, the patient's blood pressure, pulse, and                            oxygen saturations were monitored continuously. The                            [id number]-H190 ([id number]) Olympus endoscope was introduced                            through the mouth, and advanced to the fourth part                            of duodenum. The upper GI endoscopy was somewhat                            difficult due to excessive coughing throughout the                            procedure, difficulty maintaining adequate                            insufflation of the stomach as well as excessive                            intestinal peristalsis. Successful completion of                            the procedure was aided by oral suctioning, cricoid                            pressure to improve gastric insufflation and  administration of glucagon 0.5 mg IV. The patient                            tolerated the procedure. He was noted to have some                            epistaxis which resolved spontaneously. Scope In: Scope Out: Findings:      The examined portions of the nasopharynx, oropharynx and larynx were       normal.      The examined esophagus was normal.      A single 3 mm angioectasia with no bleeding was found in the gastric       body. Coagulation for tissue destruction using argon plasma at 0.5       liters/minute and 20 watts was successful. Estimated blood loss: none.      The exam of the stomach was otherwise normal.      A single 6 mm angioectasia with bleeding on contact was found in the       fourth portion of the duodenum. Coagulation for hemostasis using argon       plasma at 0.5 liters/minute and 20 watts was successful. To reduce risk       of recurrent bleeding one hemostatic clip was successfully placed (MR        conditional) over the APC site. There was no bleeding at the end of the       procedure. The images of this lesion and the hemoclip placement       apparently did not capture.      Lymphangiectasia was present in the second portion of the duodenum, in       the third portion of the duodenum and in the fourth portion of the       duodenum.      The exam of the duodenum was otherwise normal. Impression:               - The examined portions of the nasopharynx,                            oropharynx and larynx were normal.                           - Normal esophagus.                           - A single non-bleeding angioectasia in the                            stomach. Treated with argon plasma coagulation                            (APC).                           - A single angioectasia in the duodenum. Treated                            with argon plasma coagulation (APC). Clip (MR  conditional) was placed.                           - Duodenal mucosal lymphangiectasia. Not biopsied                            as this was biopsied previously.                           - No specimens collected. Recommendation:           - Return patient to hospital ward for ongoing care.                           - Resume previous diet.                           - Resume aspirin at prior dose in 2 days.                           - Protonix 40 mg PO BID for 4 weeks as gastric APC                            site heals. Procedure Code(s):        --- Professional ---                           [phone], Esophagogastroduodenoscopy, flexible,                            transoral; with ablation of tumor(s), polyp(s), or                            other lesion(s) (includes pre- and post-dilation                            and guide wire passage, when performed) Diagnosis Code(s):        --- Professional ---                           Z56.387, Angiodysplasia of stomach and duodenum                             without bleeding                           K55.20, Angiodysplasia of colon without hemorrhage CPT copyright 2019 American Medical Association. All rights reserved. The codes documented in this report are preliminary and upon coder review may  be revised to meet current compliance requirements. Zalika Tieszen E. Candis Schatz, MD 02/19/2021 10:09:57 AM This report has been signed electronically. Number of Addenda: 0

## 2021-02-20 ENCOUNTER — Inpatient Hospital Stay (HOSPITAL_COMMUNITY): Payer: Medicare Other

## 2021-02-20 DIAGNOSIS — M349 Systemic sclerosis, unspecified: Secondary | ICD-10-CM | POA: Diagnosis not present

## 2021-02-20 DIAGNOSIS — J690 Pneumonitis due to inhalation of food and vomit: Secondary | ICD-10-CM

## 2021-02-20 DIAGNOSIS — K922 Gastrointestinal hemorrhage, unspecified: Secondary | ICD-10-CM | POA: Diagnosis not present

## 2021-02-20 DIAGNOSIS — J849 Interstitial pulmonary disease, unspecified: Secondary | ICD-10-CM

## 2021-02-20 DIAGNOSIS — J189 Pneumonia, unspecified organism: Secondary | ICD-10-CM | POA: Diagnosis not present

## 2021-02-20 LAB — COMPREHENSIVE METABOLIC PANEL
ALT: 52 U/L — ABNORMAL HIGH (ref 0–44)
AST: 41 U/L (ref 15–41)
Albumin: 2.4 g/dL — ABNORMAL LOW (ref 3.5–5.0)
Alkaline Phosphatase: 71 U/L (ref 38–126)
Anion gap: 9 (ref 5–15)
BUN: 65 mg/dL — ABNORMAL HIGH (ref 8–23)
CO2: 15 mmol/L — ABNORMAL LOW (ref 22–32)
Calcium: 8 mg/dL — ABNORMAL LOW (ref 8.9–10.3)
Chloride: 111 mmol/L (ref 98–111)
Creatinine, Ser: 3.06 mg/dL — ABNORMAL HIGH (ref 0.61–1.24)
GFR, Estimated: 22 mL/min — ABNORMAL LOW (ref >60)
Glucose, Bld: 101 mg/dL — ABNORMAL HIGH (ref 70–99)
Potassium: 3.8 mmol/L (ref 3.5–5.1)
Sodium: 135 mmol/L (ref 135–145)
Total Bilirubin: 0.6 mg/dL (ref 0.3–1.2)
Total Protein: 5.3 g/dL — ABNORMAL LOW (ref 6.5–8.1)

## 2021-02-20 LAB — RESPIRATORY PANEL BY PCR

## 2021-02-20 LAB — RESP PANEL BY RT-PCR (FLU A&B, COVID) ARPGX2
Influenza A by PCR: NEGATIVE
Influenza B by PCR: NEGATIVE
SARS Coronavirus 2 by RT PCR: NEGATIVE

## 2021-02-20 LAB — URINALYSIS, COMPLETE (UACMP) WITH MICROSCOPIC
Bacteria, UA: NONE SEEN
Bilirubin Urine: NEGATIVE
Glucose, UA: NEGATIVE mg/dL
Hgb urine dipstick: NEGATIVE
Ketones, ur: NEGATIVE mg/dL
Leukocytes,Ua: NEGATIVE
Nitrite: NEGATIVE
Protein, ur: NEGATIVE mg/dL
Specific Gravity, Urine: 1.009 (ref 1.005–1.030)
pH: 5 (ref 5.0–8.0)

## 2021-02-20 LAB — LACTIC ACID, PLASMA: Lactic Acid, Venous: 1.1 mmol/L (ref 0.5–1.9)

## 2021-02-20 LAB — MRSA NEXT GEN BY PCR, NASAL: MRSA by PCR Next Gen: NOT DETECTED

## 2021-02-20 LAB — PROCALCITONIN: Procalcitonin: 4.84 ng/mL

## 2021-02-20 LAB — GLUCOSE, CAPILLARY
Glucose-Capillary: 84 mg/dL (ref 70–99)
Glucose-Capillary: 78 mg/dL (ref 70–99)
Glucose-Capillary: 216 mg/dL — ABNORMAL HIGH (ref 70–99)
Glucose-Capillary: 91 mg/dL (ref 70–99)
Glucose-Capillary: 111 mg/dL — ABNORMAL HIGH (ref 70–99)

## 2021-02-20 LAB — STREP PNEUMONIAE URINARY ANTIGEN: Strep Pneumo Urinary Antigen: NEGATIVE

## 2021-02-20 LAB — MAGNESIUM: Magnesium: 2 mg/dL (ref 1.7–2.4)

## 2021-02-20 LAB — HIV ANTIBODY (ROUTINE TESTING W REFLEX): HIV Screen 4th Generation wRfx: NONREACTIVE

## 2021-02-20 IMAGING — CT CT CHEST W/O CM
2 of 3 series · 15 of 36 positions shown, 18 images · non-contrast
Comparison: Previous studies including the chest radiographs done
on [DATE]

CLINICAL DATA: Pneumonia

EXAM:
CT CHEST WITHOUT CONTRAST
TECHNIQUE: Multidetector CT imaging of the chest was performed following the
standard protocol without IV contrast.

[Series 3: chest w/o 2mm st · axial · non-contrast · 0.66mm/px · z∈[-669,-385]mm · 12 of 168 slices shown, 15 images]
[im 13/168  mediastinal]
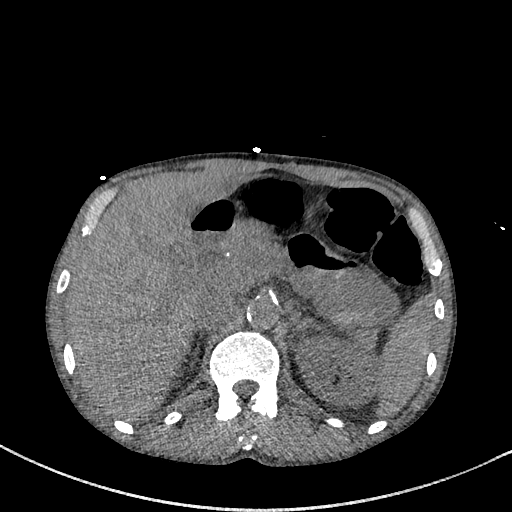
[im 13/168  lung]
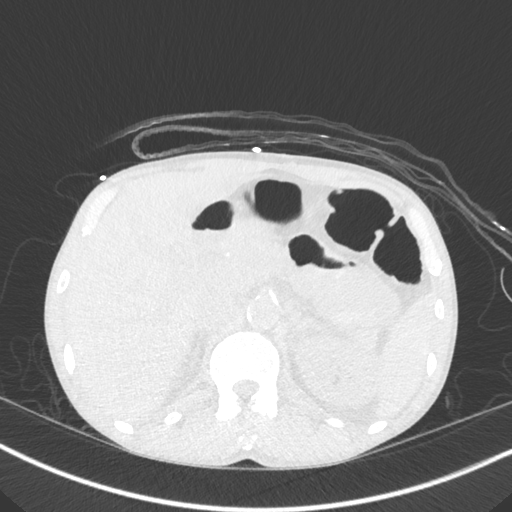
[im 25/168  lung]
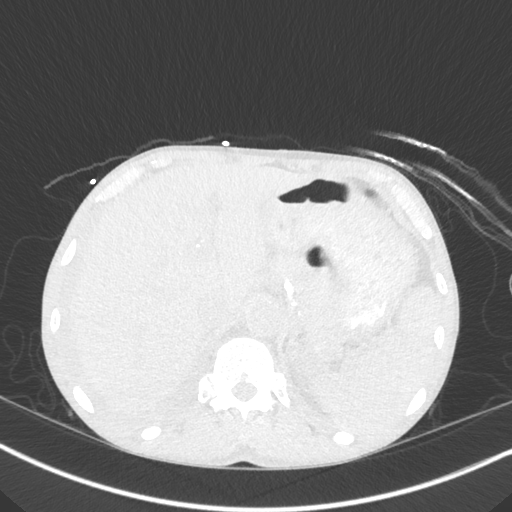
[im 38/168  lung]
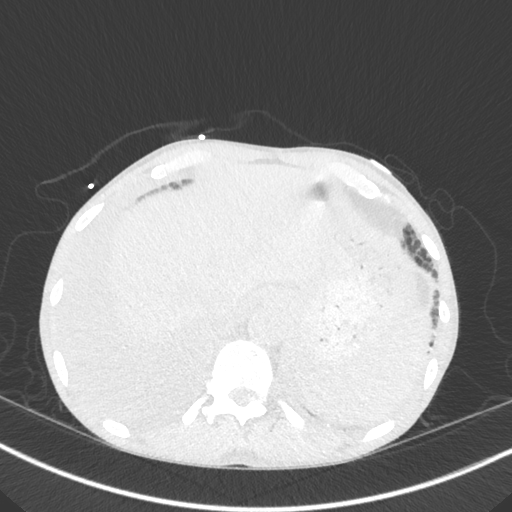
[im 50/168  lung]
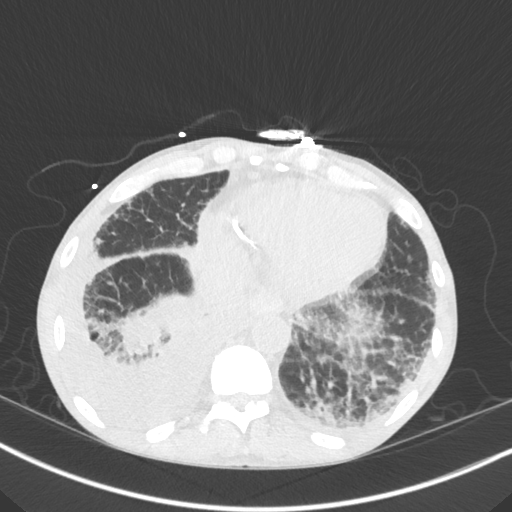
[im 62/168  mediastinal]
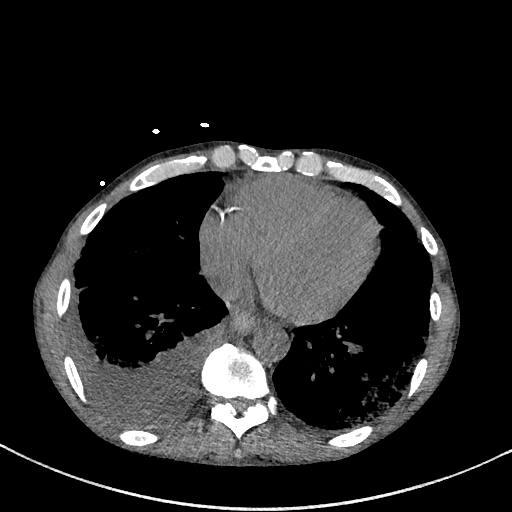
[im 62/168  lung]
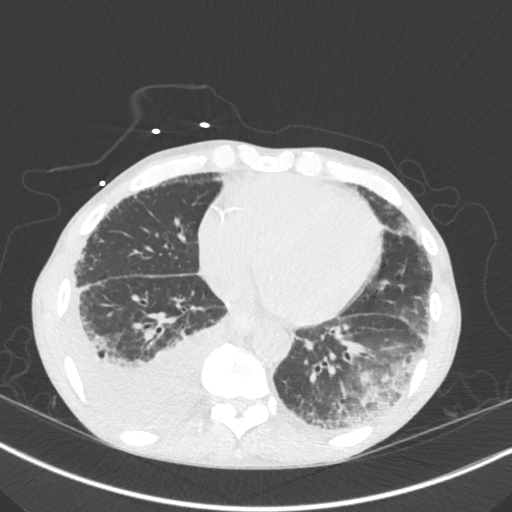
[im 75/168  lung]
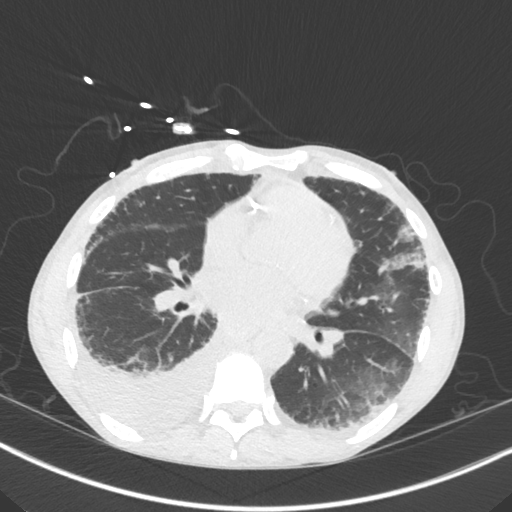
[im 93/168  lung]
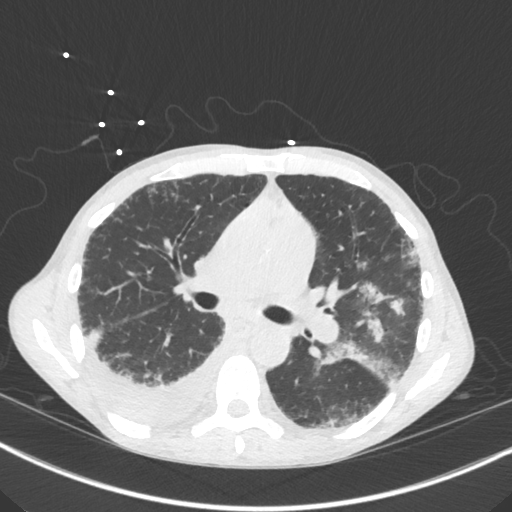
[im 106/168  lung]
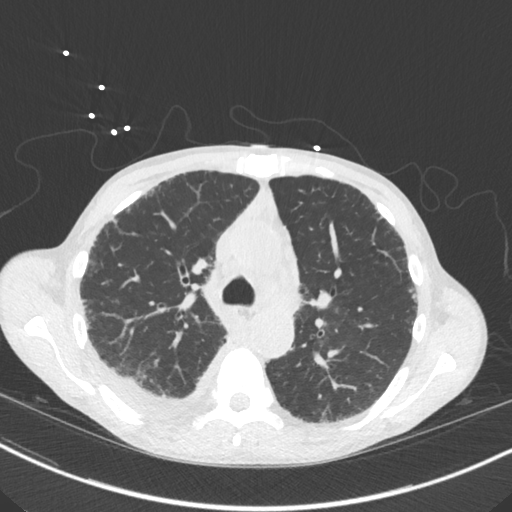
[im 118/168  mediastinal]
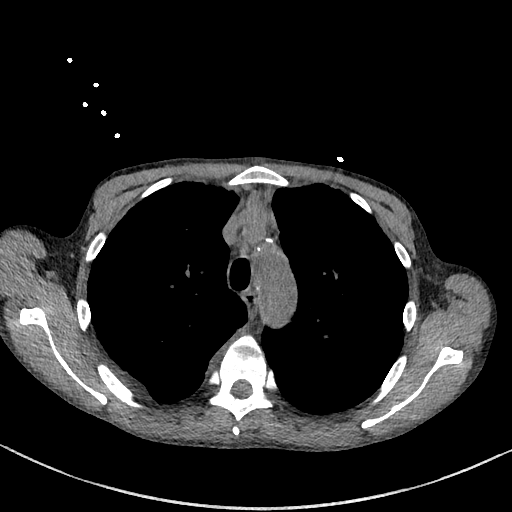
[im 118/168  lung]
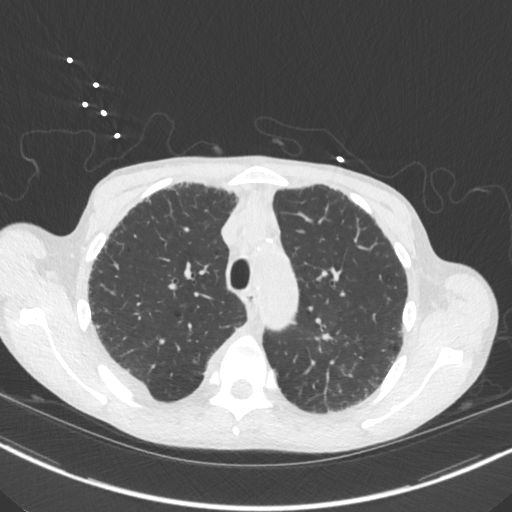
[im 130/168  lung]
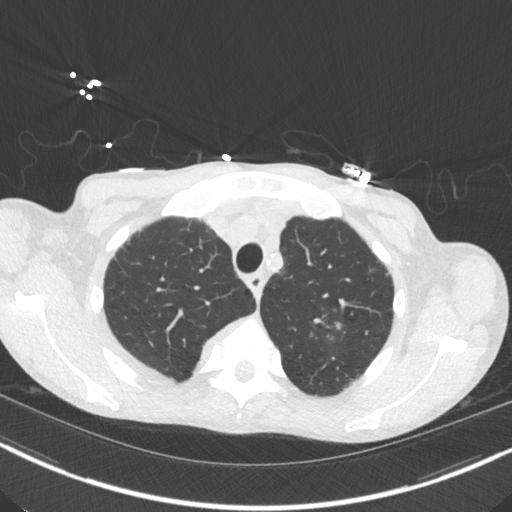
[im 143/168  lung]
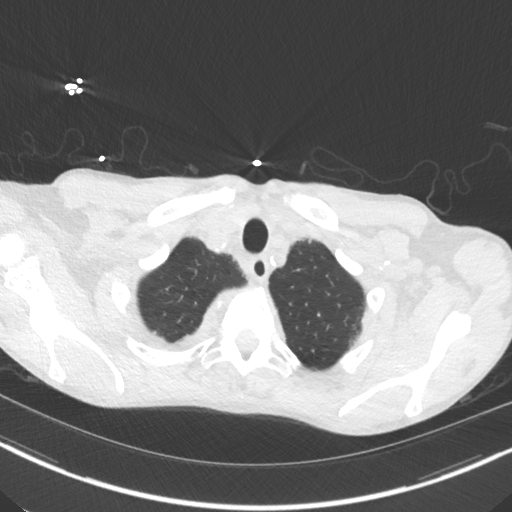
[im 155/168  lung]
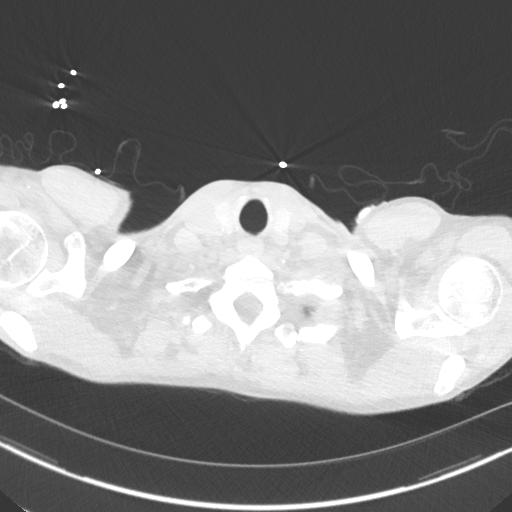

[Series 5: chest w/o 2mm st cor · coronal · non-contrast · 0.54mm/px · 3 of 108 slices shown]
[im 22/108  lung]
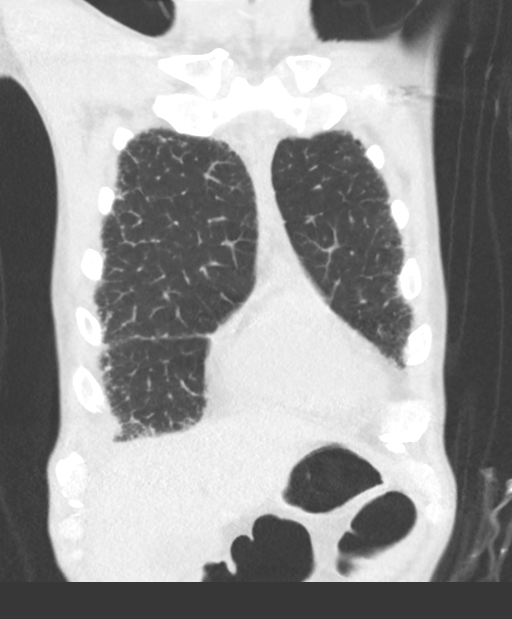
[im 43/108  lung]
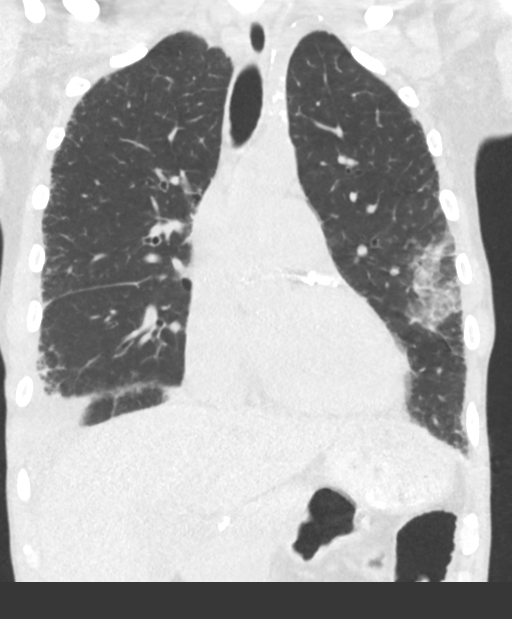
[im 65/108  lung]
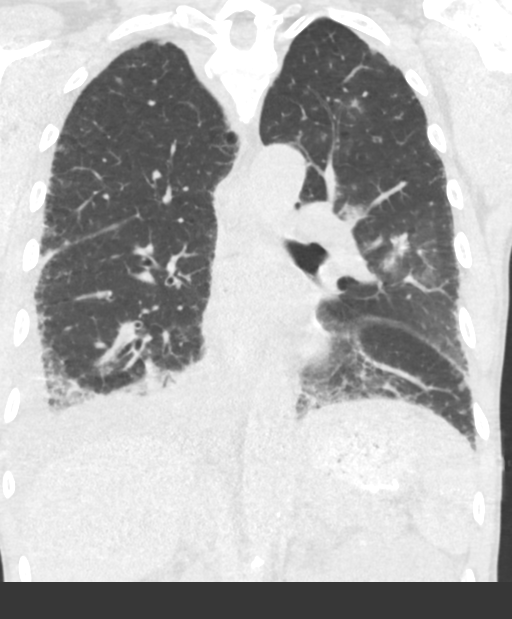

[15 of 36 positions shown; findings below may reference images not displayed]

FINDINGS: Cardiovascular: Coronary artery calcifications are seen. There is
ectasia of main pulmonary artery measuring 3.3 cm

Mediastinum/Nodes: There are slightly enlarged lymph nodes in
mediastinum.

Lungs/Pleura: Moderate right pleural effusion is present. New patchy
ground-glass infiltrates are seen in left upper lobe, left lower
lobe and right lower lobe. No definite cavitary lesions are seen.
Some of the infiltrates have a nodular appearance. Centrilobular
emphysema is seen. There is no pneumothorax.

Upper Abdomen: Unremarkable

Musculoskeletal: Unremarkable
IMPRESSION: There are new patchy ground-glass infiltrates in left upper lobe and
both lower lobes suggesting bilateral pneumonia. There is no
demonstrable cavitary lesion in the lung fields.

COPD. Moderate right pleural effusion. Coronary artery
calcifications are seen.

## 2021-02-20 IMAGING — DX DG CHEST 1V PORT
1 series · 1 of 1 positions shown · non-contrast
Comparison: Radiograph [DATE], CT [DATE]

CLINICAL DATA: short of breath

EXAM:
PORTABLE CHEST 1 VIEW

[chest ap]
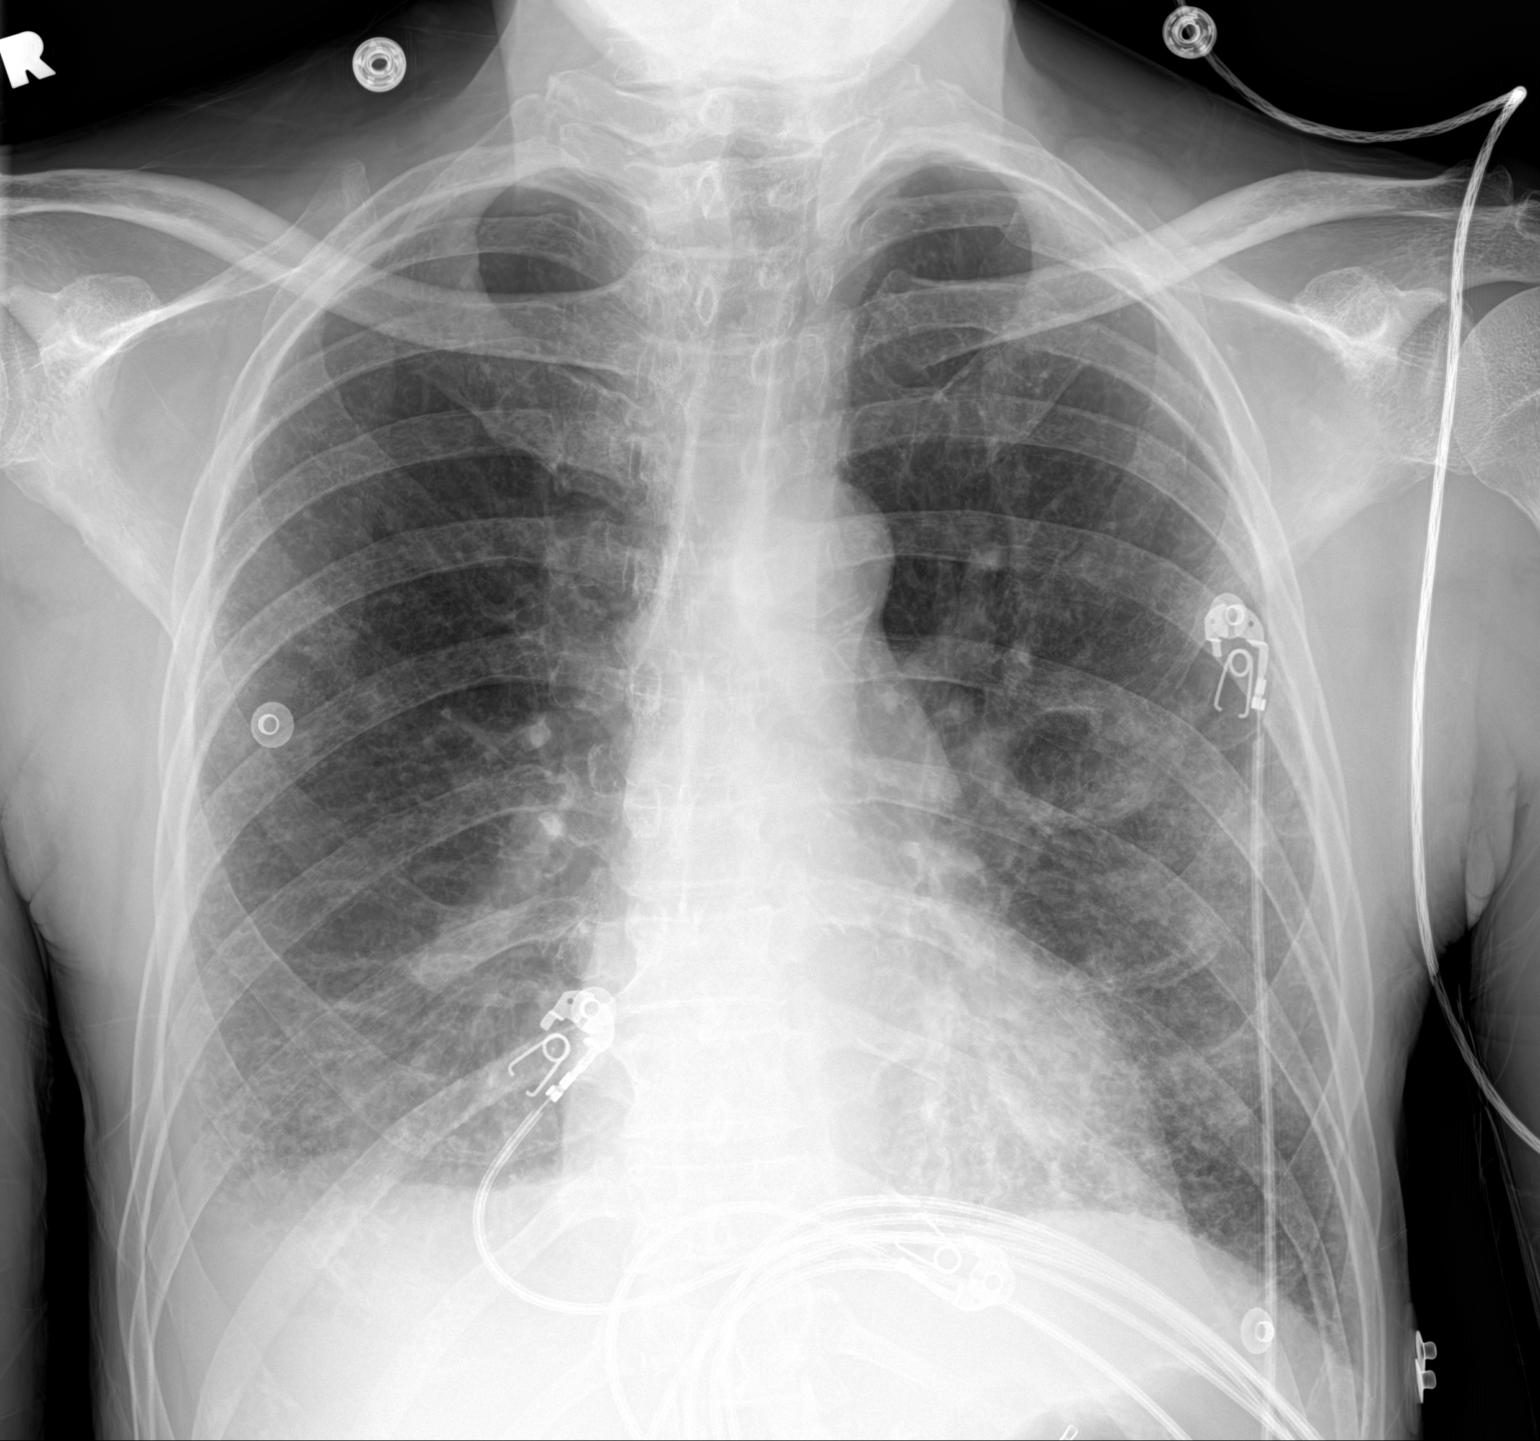

[1 of 1 positions shown; findings below may reference images not displayed]

FINDINGS: Normal cardiac silhouette.

Decreased density the airspace disease seen in the mid LEFT lung on
comparison radiograph. However, there is a new central lucency at
this location which suggest potential cavitation.

RIGHT basilar effusion is similar to prior.

Chronic bronchitic markings throughout the lungs.
IMPRESSION: 1. New cavitation of airspace opacity/pneumonia seen on comparison
radiograph.
2. Stable RIGHT lower lobe pleural effusion.

## 2021-02-20 MED ORDER — VANCOMYCIN VARIABLE DOSE PER UNSTABLE RENAL FUNCTION (PHARMACIST DOSING): Status: DC

## 2021-02-20 MED ORDER — SODIUM CHLORIDE 0.9 % IV SOLN
2.0000 g | INTRAVENOUS | Status: DC
  Administered 2021-02-20: 2 g via INTRAVENOUS
  Filled 2021-02-20: qty 20

## 2021-02-20 MED ORDER — SODIUM CHLORIDE 0.9 % IV SOLN
1.0000 g | Freq: Two times a day (BID) | INTRAVENOUS | Status: DC
  Administered 2021-02-20 (×2): 1 g via INTRAVENOUS
  Filled 2021-02-20 (×4): qty 1

## 2021-02-20 MED ORDER — LACTATED RINGERS IV BOLUS
1000.0000 mL | Freq: Once | INTRAVENOUS | Status: AC
  Administered 2021-02-20: 1000 mL via INTRAVENOUS

## 2021-02-20 MED ORDER — AZITHROMYCIN 500 MG PO TABS
500.0000 mg | ORAL_TABLET | Freq: Every day | ORAL | Status: DC
  Administered 2021-02-20: 500 mg via ORAL
  Filled 2021-02-20: qty 1

## 2021-02-20 MED ORDER — LACTATED RINGERS IV SOLN: INTRAVENOUS | Status: AC

## 2021-02-20 MED ORDER — VANCOMYCIN HCL IN DEXTROSE 1-5 GM/200ML-% IV SOLN
1000.0000 mg | Freq: Once | INTRAVENOUS | Status: AC
  Administered 2021-02-20: 1000 mg via INTRAVENOUS
  Filled 2021-02-20: qty 200

## 2021-02-20 MED ORDER — METRONIDAZOLE 500 MG PO TABS
500.0000 mg | ORAL_TABLET | Freq: Three times a day (TID) | ORAL | Status: DC
  Administered 2021-02-20: 500 mg via ORAL
  Filled 2021-02-20: qty 1
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
## 2021-02-20 NOTE — Plan of Care (Signed)
Pt alert and oriented X4 ovenight, febrile with tachycardia and soft BP earlier in the shift with Tmax 100.50F. Red MEWs fired, VS protocol implemented. Pt and wife at bedside updated on change in VS and change in plan of care. Rapid RN notified, per Rapid RN to notify provider to obtain labs. Pt in NAD. Provider placed several orders throughout the night including labs and repeat respiratory PCR. IV abx initiated. Improvement in VS noted post tylenol and LR bolus. VSS since intervention and HR back in 80s.

## 2021-02-20 NOTE — Progress Notes (Signed)
CXR showing apparent L sided PNA findings.  Lab work thus far is also suspect for possible bacterial PNA with WBC 17k up from 8.2k this AM, COVID and Flu are negative:  1) PNA pathway 2) empiric rocephin + azithromycin 3) check MRSA PCR nares 4) check RVP 5) BCx still pending 6) procalcitonin still pending

## 2021-02-20 NOTE — Progress Notes (Signed)
Pt back on unit from ct, alert and oriented, speaking with staff at this time, denies pain or discomfort, awaiting iv team for new iv.

## 2021-02-20 NOTE — Progress Notes (Signed)
Pharmacy Antibiotic Note  [name] is a 60 y.o. male admitted on 02/17/2021 with new cavitary pneumonia. Of note, patient is not on dialysis. Pharmacy has been consulted for vancomycin and meropenem dosing.  WBC 17.4 (WNL on admit), afebrile   Plan: Vancomycin 1 g load, dose per levels  Meropenem 1 g q 12h  F/u renal function, LOT and to narrow   Height: _0  (172.7 cm) Weight: 51 kg (112 lb 7 oz) IBW/kg (Calculated) : 68.4  Temp (24hrs), Avg:98.7 F (37.1 C), Min:97.6 F (36.4 C), Max:100.5 F (38.1 C)  Recent Labs  Lab 02/15/21 1037 02/17/21 1330 02/18/21 0957 02/18/21 1924 02/19/21 0717 02/19/21 1119 02/19/21 2304 02/19/21 2317  WBC  --  5.1 5.7 7.0  --  8.2  --  17.4*  CREATININE 3.18* 2.86*  --   --  3.01*  --   --  3.06*  LATICACIDVEN  --   --   --   --   --   --  1.1  --     Estimated Creatinine Clearance: 18.1 mL/min (A) (by C-G formula based on SCr of 3.06 mg/dL (H)).    Allergies  Allergen Reactions   Oxycodone Other (See Comments)    Hallucinations    Antimicrobials this admission: Azithromycin 10/30 Ceftriaxone 10/30 Flagyl 10/30  Microbiology results: 10/29 BPP:HKFE Resp PCR - neg MRSA PCR neg  Thank you for allowing pharmacy to be a part of this patient's care.  Levonne Spiller 02/20/2021 11:01 AM

## 2021-02-20 NOTE — Progress Notes (Signed)
   02/19/21 2149  Assess: MEWS Score  Pulse Rate (!) 127  SpO2 100 %  O2 Device Room Air  Assess: MEWS Score  MEWS Temp 1  MEWS Systolic 1  MEWS Pulse 2  MEWS RR 0  MEWS LOC 0  MEWS Score 4  MEWS Score Color Red  Assess: if the MEWS score is Yellow or Red  Were vital signs taken at a resting state? Yes  Focused Assessment No change from prior assessment  Early Detection of Sepsis Score *See Row Information* High  MEWS guidelines implemented *See Row Information* Yes  Treat  Pain Scale 0-10  Pain Score 0  Take Vital Signs  Increase Vital Sign Frequency  Red: Q 1hr X 4 then Q 4hr X 4, if remains red, continue Q 4hrs  Escalate  MEWS: Escalate Red: discuss with charge nurse/RN and provider, consider discussing with RRT  Notify: Charge Nurse/RN  Name of Charge Nurse/RN Notified NIkki  Date Charge Nurse/RN Notified 02/19/21  Time Charge Nurse/RN Notified 2157  Notify: Provider  Provider Name/Title Sheran Luz MD  Date Provider Notified 02/19/21  Time Provider Notified 2156  Notification Type  (secure chat)  Notification Reason Change in status (Red MEWs; pt has temp 100.27F; BP 90/59 MAP70; HR 127 RR20 100% RA. Red MEWs. Giving tylenol. Holding PM dose of Sildenafil Citrate until bp Improves. Labs?)  Provider response See new orders  Date of Provider Response 02/19/21  Time of Provider Response 2155  Notify: Rapid Response  Name of Rapid Response RN Notified courtney RN  Date Rapid Response Notified 02/19/21  Time Rapid Response Notified 2154  Document  Patient Outcome Stabilized after interventions  Progress note created (see row info) Yes

## 2021-02-20 NOTE — Progress Notes (Signed)
PROGRESS NOTE                                                                                                                                                                                                             Patient Demographics:    [name], is a 60 y.o. male, DOB - Mar 24, 1961, ZES:[id number]  Outpatient Primary MD for the patient is [name], Ravisankar, MD    LOS - 3  Admit date - 02/17/2021    Chief Complaint  Patient presents with   GI Bleeding       Brief Narrative (HPI from H&P) -  [name] is a 60 y.o. male with medical history significant of Scleroderma, CAD, CKD, HTN, HFpEF, AVMs GI tract with prior h/o needing transfusion. Earlier on the day of admission patient had increased DOE and also started passing bright red blod with clots per rectum, in the ER he was found to have severe anemia requiring transfusion and was admitted for GI bleed work-up.   Subjective:   Patient in bed, appears comfortable, denies any headache, no fever, no chest pain or pressure, no shortness of breath but +ve dry cough, no abdominal pain. No new focal weakness.    Assessment  & Plan :     Acute lower GI bleed induced acute blood loss anemia with symptoms - he is s/p 2 units of packed RBC transfusion on 02/17/2021, posttransfusion H&H appears stable, no further signs of bleed on PPI, seen by GI underwent EGD and colonoscopy on 02/19/2021 each showing 2 nonbleeding angiodysplasias in stomach, duodenum and 2 in the colonic area all were APC'd, soft diet monitor H&H another 24 hours.  2.  CKD 3B.  Creatinine close to baseline.  3.  Chronic diastolic heart failure EF 55%.  Currently compensated.  Continue low-dose diuretic and monitor.  4.  History of gout.  On allopurinol.  5.  Dyslipidemia.  On statin.  6.  History of pulmonary hypertension with Scleroderma.  On home medications along with supportive  care.  7. Sepsis - aspiration pneumonia likely during EGD/colonoscopy.  He does have a dry cough was febrile night of 02/19/2021 with hypotension and leukocytosis, chest x-ray shows left-sided cavitary lesion which is new from 2 days ago.  We will proceed with blood cultures, sputum gram stain and culture, empiric IV antibiotics, trend  procalcitonin and clinically.  We will also obtain pulmonary input in the light of his pulmonary hypertension and underlying scleroderma.  IV fluid bolus has been provided, he appears nontoxic.      Condition -  Guarded  Family Communication  :  None present  Code Status :  Full  Consults  :  GI, Pulm  PUD Prophylaxis : PPI   Procedures  :     CT chest -  EGD - 2 nonbleeding angiodysplasia noted in stomach and duodenum both underwent APC  Colonoscopy - 2 nonbleeding angiodysplasias in the colon both underwent APC      Disposition Plan  :    Status is: Inpatient  Remains inpatient appropriate because: GI Bleed workup  DVT Prophylaxis  :    SCDs Start: 02/17/21 1731    Lab Results  Component Value Date   PLT 158 02/19/2021    Diet :  Diet Order             DIET SOFT Room service appropriate? Yes; Fluid consistency: Thin  Diet effective now                    Inpatient Medications  Scheduled Meds:  allopurinol  100 mg Oral Daily   macitentan  10 mg Oral Daily   nicotine  21 mg Transdermal Daily   pantoprazole  40 mg Oral BID   potassium chloride SA  20 mEq Oral Daily   rosuvastatin  20 mg Oral Daily   sildenafil  40 mg Oral TID   Continuous Infusions:  lactated ringers     lactated ringers 50 mL/hr at 02/20/21 0915   PRN Meds:.acetaminophen **OR** acetaminophen, ALPRAZolam, guaiFENesin, ketorolac  Antibiotics  :    Anti-infectives (From admission, onward)    Start     Dose/Rate Route Frequency Ordered Stop   02/20/21 1000  azithromycin (ZITHROMAX) tablet 500 mg  Status:  Discontinued        500 mg Oral Daily  02/20/21 0110 02/20/21 1037   02/20/21 0800  metroNIDAZOLE (FLAGYL) tablet 500 mg  Status:  Discontinued        500 mg Oral Every 8 hours 02/20/21 0701 02/20/21 1037   02/20/21 0200  cefTRIAXone (ROCEPHIN) 2 g in sodium chloride 0.9 % 100 mL IVPB  Status:  Discontinued        2 g 200 mL/hr over 30 Minutes Intravenous Every 24 hours 02/20/21 0110 02/20/21 1037        Time Spent in minutes  30   Lala Lund M.D on 02/20/2021 at 10:53 AM  To page go to [email or web address]   [facility] -  Office  [phone]  See all Orders from today for further details    Objective:   Vitals:   02/20/21 0200 02/20/21 0400 02/20/21 0447 02/20/21 0834  BP: (!) 90/58 97/62 (!) 92/57 (!) 90/55  Pulse: 85 79 76 86  Resp: _0 Temp: 98.3 F (36.8 C)  98.1 F (36.7 C) 98.1 F (36.7 C)  TempSrc: Oral  Oral Oral  SpO2: 100% 100% 100% 100%  Weight:      Height:        Wt Readings from Last 3 Encounters:  02/18/21 51 kg  02/14/21 50.8 kg  01/24/21 49.9 kg     Intake/Output Summary (Last 24 hours) at 02/20/2021 1053 Last data filed at 02/20/2021 0300 Gross per 24 hour  Intake 1862.46 ml  Output 1275 ml  Net 587.46 ml  Physical Exam  Awake Alert, No new F.N deficits, Normal affect [place].AT,PERRAL Supple Neck, No JVD,   Symmetrical Chest wall movement, Good air movement bilaterally, CTAB RRR,No Gallops, Rubs or new Murmurs,  +ve B.Sounds, Abd Soft, No tenderness,   No Cyanosis, Clubbing or edema        Data Review:    CBC Recent Labs  Lab 02/17/21 1330 02/17/21 1736 02/18/21 0109 02/18/21 0957 02/18/21 1924 02/19/21 1119 02/19/21 2317  WBC 5.1  --   --  5.7 7.0 8.2 17.4*  HGB 6.6*   < > 9.6* 9.9* 10.7* 9.7* 8.1*  HCT 21.7*   < > 29.7* 30.9* 32.6* 30.3* 24.3*  PLT 169  --   --  169 184 167 158  MCV 100.0  --   --  93.9 92.9 95.9 92.4  MCH 30.4  --   --  30.1 30.5 30.7 30.8  MCHC 30.4  --   --  32.0 32.8 32.0 33.3  RDW 17.6*  --   --  18.7* 19.0*  18.5* 18.1*  LYMPHSABS 0.9  --   --   --   --   --  0.8  MONOABS 0.3  --   --   --   --   --  0.9  EOSABS 0.0  --   --   --   --   --  0.0  BASOSABS 0.0  --   --   --   --   --  0.0   < > = values in this interval not displayed.    Recent Labs  Lab 02/15/21 1037 02/17/21 1330 02/17/21 1812 02/19/21 0717 02/19/21 2304 02/19/21 2317  NA 139 139  --  143  --  135  K 4.5 4.4  --  4.2  --  3.8  CL 111 113*  --  115*  --  111  CO2 18* 18*  --  16*  --  15*  GLUCOSE 80 109*  --  55*  --  101*  BUN 62* 73*  --  74*  --  65*  CREATININE 3.18* 2.86*  --  3.01*  --  3.06*  CALCIUM 8.3*  8.2* 8.3*  --  8.5*  --  8.0*  AST  --  70* 67* 69*  --  41  ALT  --  85* 75* 72*  --  52*  ALKPHOS  --  80 66 72  --  71  BILITOT  --  0.3 0.7 1.0  --  0.6  ALBUMIN 2.9* 2.8* 2.7* 2.8*  --  2.4*  MG  --   --   --  1.2*  --  2.0  PROCALCITON  --   --   --   --  4.84  --   LATICACIDVEN  --   --   --   --  1.1  --   INR  --  1.2  --   --   --   --   BNP  --  95.9  --   --   --   --     ------------------------------------------------------------------------------------------------------------------ No results for input(s): CHOL, HDL, LDLCALC, TRIG, CHOLHDL, LDLDIRECT in the last 72 hours.  Lab Results  Component Value Date   HGBA1C 4.9 10/09/2019   ------------------------------------------------------------------------------------------------------------------ No results for input(s): TSH, T4TOTAL, T3FREE, THYROIDAB in the last 72 hours.  Invalid input(s): FREET3  Cardiac Enzymes No results for input(s): CKMB, TROPONINI, MYOGLOBIN in the last 168 hours.  Invalid input(s):  CK ------------------------------------------------------------------------------------------------------------------    Component Value Date/Time   BNP 95.9 02/17/2021 1330   BNP 235.3 (H) 02/24/2016 1304    Radiology Reports DG Chest 2 View  Result Date: 02/17/2021 CLINICAL DATA:  Shortness of breath EXAM: CHEST  - 2 VIEW COMPARISON:  Chest radiograph dated 11/08/2019 and CT chest dated 01/04/2021. FINDINGS: The heart size and mediastinal contours are within normal limits. Vascular calcifications are seen in the aortic arch. There is a small right pleural effusion with associated atelectasis. Mild bibasilar reticular opacities are consistent with a chronic process. There is no left pleural effusion. There is no pneumothorax. Degenerative changes are seen in the spine. IMPRESSION: Small right pleural effusion with associated atelectasis. Aortic Atherosclerosis (ICD10-I70.0). Electronically Signed   By: Zerita Boers M.D.   On: 02/17/2021 13:23   DG Chest Port 1 View  Result Date: 02/20/2021 CLINICAL DATA:  short of breath EXAM: PORTABLE CHEST 1 VIEW COMPARISON:  Radiograph 02/19/2021, CT 01/04/2021 FINDINGS: Normal cardiac silhouette. Decreased density the airspace disease seen in the mid LEFT lung on comparison radiograph. However, there is a new central lucency at this location which suggest potential cavitation. RIGHT basilar effusion is similar to prior. Chronic bronchitic markings throughout the lungs. IMPRESSION: 1. New cavitation of airspace opacity/pneumonia seen on comparison radiograph. 2. Stable RIGHT lower lobe pleural effusion. Electronically Signed   By: Suzy Bouchard M.D.   On: 02/20/2021 08:59   DG CHEST PORT 1 VIEW  Result Date: 02/19/2021 CLINICAL DATA:  Sepsis. EXAM: PORTABLE CHEST 1 VIEW COMPARISON:  Radiograph 2 days ago 02/17/2021, chest CT 01/04/2021 FINDINGS: The heart is normal in size. Unchanged mediastinal contours. Right pleural effusion has slightly increased from prior exam. Associated basilar opacity. There is a new patchy opacity in the left mid lung. Peripheral interstitial thickening which was better appreciated on prior CT. No visualized left pleural effusion. No pneumothorax. Stable osseous structures. IMPRESSION: 1. Slight increase in size of right pleural effusion with  associated basilar opacity. 2. New patchy opacity in the left mid lung, suspicious for pneumonia. 3. Interstitial lung disease previously characterized on high-resolution chest CT. Electronically Signed   By: Keith Rake M.D.   On: 02/19/2021 22:50

## 2021-02-20 NOTE — Progress Notes (Signed)
Notified me of pt current bp: 90/55 hr 86, instructed via md to hold sildenafil at this time.

## 2021-02-20 NOTE — Progress Notes (Signed)
Notified md of current bp: 97/58 per md: hold if sbp<105, order modified.

## 2021-02-20 NOTE — Progress Notes (Addendum)
Daily Rounding Note  02/20/2021, 12:50 PM  LOS: 3 days   SUBJECTIVE:   Chief complaint:    Anemia.  Painless hematochezia. Has not passed any stool or anything from his rectum since before the procedure yesterday. As well.  Not experiencing dizziness, weakness, fatigue, shortness of breath.  OBJECTIVE:         Vital signs in last 24 hours:    Temp:  [97.5 F (36.4 C)-100.5 F (38.1 C)] 97.6 F (36.4 C) (10/30 1247) Pulse Rate:  [75-127] 91 (10/30 1247) Resp:  [16-26] 16 (10/30 1247) BP: (88-132)/(53-63) 132/61 (10/30 1247) SpO2:  [99 %-100 %] 100 % (10/30 1247) Last BM Date: 02/19/21 Filed Weights   02/18/21 0100  Weight: 51 kg   General: Looks somewhat chronically ill and older for age but comfortable and pleasant. Heart: RRR. Chest: Clear bilaterally. Abdomen: Not tender.  Soft.  No distention.  Active bowel sounds Extremities: Thin arms and legs, no edema. Neuro/Psych: Oriented x3.  No tremors.  Moves all 4 limbs without gross deficit.  Intake/Output from previous day: 10/29 0701 - 10/30 0700 In: 2362.5 [P.O.:720; I.V.:500; IV Piggyback:142.5] Out: 9977 [Urine:1275]  Intake/Output this shift: Total I/O In: 120 [P.O.:120] Out: -   Lab Results: Recent Labs    02/18/21 1924 02/19/21 1119 02/19/21 2317  WBC 7.0 8.2 17.4*  HGB 10.7* 9.7* 8.1*  HCT 32.6* 30.3* 24.3*  PLT 184 167 158   BMET Recent Labs    02/17/21 1330 02/19/21 0717 02/19/21 2317  NA 139 143 135  K 4.4 4.2 3.8  CL 113* 115* 111  CO2 18* 16* 15*  GLUCOSE 109* 55* 101*  BUN 73* 74* 65*  CREATININE 2.86* 3.01* 3.06*  CALCIUM 8.3* 8.5* 8.0*   LFT Recent Labs    02/17/21 1812 02/19/21 0717 02/19/21 2317  PROT 5.8* 5.9* 5.3*  ALBUMIN 2.7* 2.8* 2.4*  AST 67* 69* 41  ALT 75* 72* 52*  ALKPHOS 66 72 71  BILITOT 0.7 1.0 0.6  BILIDIR 0.3*  --   --   IBILI 0.4  --   --    PT/INR Recent Labs    02/17/21 1330  LABPROT  14.7  INR 1.2   Hepatitis Panel No results for input(s): HEPBSAG, HCVAB, HEPAIGM, HEPBIGM in the last 72 hours.  Studies/Results: DG Chest Port 1 View  Result Date: 02/20/2021 CLINICAL DATA:  short of breath EXAM: PORTABLE CHEST 1 VIEW COMPARISON:  Radiograph 02/19/2021, CT 01/04/2021 FINDINGS: Normal cardiac silhouette. Decreased density the airspace disease seen in the mid LEFT lung on comparison radiograph. However, there is a new central lucency at this location which suggest potential cavitation. RIGHT basilar effusion is similar to prior. Chronic bronchitic markings throughout the lungs. IMPRESSION: 1. New cavitation of airspace opacity/pneumonia seen on comparison radiograph. 2. Stable RIGHT lower lobe pleural effusion. Electronically Signed   By: Suzy Bouchard M.D.   On: 02/20/2021 08:59   DG CHEST PORT 1 VIEW  Result Date: 02/19/2021 CLINICAL DATA:  Sepsis. EXAM: PORTABLE CHEST 1 VIEW COMPARISON:  Radiograph 2 days ago 02/17/2021, chest CT 01/04/2021 FINDINGS: The heart is normal in size. Unchanged mediastinal contours. Right pleural effusion has slightly increased from prior exam. Associated basilar opacity. There is a new patchy opacity in the left mid lung. Peripheral interstitial thickening which was better appreciated on prior CT. No visualized left pleural effusion. No pneumothorax. Stable osseous structures. IMPRESSION: 1. Slight increase in size of right pleural effusion  with associated basilar opacity. 2. New patchy opacity in the left mid lung, suspicious for pneumonia. 3. Interstitial lung disease previously characterized on high-resolution chest CT. Electronically Signed   By: Keith Rake M.D.   On: 02/19/2021 22:50    Scheduled Meds:  allopurinol  100 mg Oral Daily   macitentan  10 mg Oral Daily   nicotine  21 mg Transdermal Daily   pantoprazole  40 mg Oral BID   potassium chloride SA  20 mEq Oral Daily   rosuvastatin  20 mg Oral Daily   sildenafil  40 mg Oral  TID   Continuous Infusions:  lactated ringers 50 mL/hr at 02/20/21 0915   meropenem (MERREM) IV 1 g (02/20/21 1318)   PRN Meds:.acetaminophen **OR** acetaminophen, ALPRAZolam, guaiFENesin, ketorolac   ASSESMENT:   Painless hematochezia.  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  02/19/2021 colonoscopy.  Was poor with stool throughout the colon.  2 nonbleeding AVMs treated with APC.  8 mm sigmoid polyp cold snare removed.  Endo Clip placed at site of polypectomy.  Sigmoid diverticulosis.  Recommend follow-up colonoscopy with Dr. Bryan Lemma with aggressive, 2-day bowel prep to look for additional smaller polyps and additional angiectasia that may have been missed.  Note that on previous colonoscopy in 2020 he also had suboptimal prep.  No plans to pursue repeat colonoscopy as inpt.    Acute on chronic anemia.  On monthly Retacrit.  Hb 6.6  >> 9.7  >> 8.1.  Iron levels low normal.  TIBC, iron sats, ferritin all at or above normal.  Not clear he receives iron infusions.  CKD.  GFR 22 consistent with stage IV CKD.  Kd is significant contributor to anemia.  Renal supervises monthly Retacrit injections.    Mediastinal density.  PET/CT planned 11/18 followed by cardiothoracic surgery and pulmonary.  Known interstitial lung disease and chronic right pleural effusion.  Smoking 1 PPD.   PLAN   Repeat CBC in the morning.  Protonix 40 mg po bid.      Azucena Freed  02/20/2021, 12:50 PM Phone [phone]

## 2021-02-21 ENCOUNTER — Inpatient Hospital Stay (HOSPITAL_COMMUNITY): Payer: Medicare Other

## 2021-02-21 ENCOUNTER — Encounter (HOSPITAL_COMMUNITY): Payer: Self-pay | Admitting: Gastroenterology

## 2021-02-21 DIAGNOSIS — D65 Disseminated intravascular coagulation [defibrination syndrome]: Secondary | ICD-10-CM

## 2021-02-21 DIAGNOSIS — A021 Salmonella sepsis: Secondary | ICD-10-CM | POA: Diagnosis not present

## 2021-02-21 DIAGNOSIS — K922 Gastrointestinal hemorrhage, unspecified: Secondary | ICD-10-CM | POA: Diagnosis not present

## 2021-02-21 DIAGNOSIS — R6520 Severe sepsis without septic shock: Secondary | ICD-10-CM

## 2021-02-21 LAB — CBC WITH DIFFERENTIAL/PLATELET
Abs Immature Granulocytes: 0.07 10*3/uL (ref 0.00–0.07)
Basophils Absolute: 0 10*3/uL (ref 0.0–0.1)
Basophils Relative: 0 %
Eosinophils Absolute: 0.1 10*3/uL (ref 0.0–0.5)
Eosinophils Relative: 1 %
HCT: 23.8 % — ABNORMAL LOW (ref 39.0–52.0)
Hemoglobin: 7.7 g/dL — ABNORMAL LOW (ref 13.0–17.0)
Immature Granulocytes: 1 %
Lymphocytes Relative: 8 %
Lymphs Abs: 0.9 10*3/uL (ref 0.7–4.0)
MCH: 30.6 pg (ref 26.0–34.0)
MCHC: 32.4 g/dL (ref 30.0–36.0)
MCV: 94.4 fL (ref 80.0–100.0)
Monocytes Absolute: 0.6 10*3/uL (ref 0.1–1.0)
Monocytes Relative: 5 %
Neutro Abs: 9.2 10*3/uL — ABNORMAL HIGH (ref 1.7–7.7)
Neutrophils Relative %: 85 %
Platelets: 149 10*3/uL — ABNORMAL LOW (ref 150–400)
RBC: 2.52 MIL/uL — ABNORMAL LOW (ref 4.22–5.81)
RDW: 17.9 % — ABNORMAL HIGH (ref 11.5–15.5)
WBC: 10.8 10*3/uL — ABNORMAL HIGH (ref 4.0–10.5)
nRBC: 0 % (ref 0.0–0.2)

## 2021-02-21 LAB — COMPREHENSIVE METABOLIC PANEL
ALT: 38 U/L (ref 0–44)
AST: 28 U/L (ref 15–41)
Albumin: 2.2 g/dL — ABNORMAL LOW (ref 3.5–5.0)
Alkaline Phosphatase: 69 U/L (ref 38–126)
Anion gap: 8 (ref 5–15)
BUN: 53 mg/dL — ABNORMAL HIGH (ref 8–23)
CO2: 16 mmol/L — ABNORMAL LOW (ref 22–32)
Calcium: 8 mg/dL — ABNORMAL LOW (ref 8.9–10.3)
Chloride: 114 mmol/L — ABNORMAL HIGH (ref 98–111)
Creatinine, Ser: 2.71 mg/dL — ABNORMAL HIGH (ref 0.61–1.24)
GFR, Estimated: 26 mL/min — ABNORMAL LOW (ref >60)
Glucose, Bld: 84 mg/dL (ref 70–99)
Potassium: 4.2 mmol/L (ref 3.5–5.1)
Sodium: 138 mmol/L (ref 135–145)
Total Bilirubin: 0.5 mg/dL (ref 0.3–1.2)
Total Protein: 5.1 g/dL — ABNORMAL LOW (ref 6.5–8.1)

## 2021-02-21 LAB — GLUCOSE, CAPILLARY: Glucose-Capillary: 82 mg/dL (ref 70–99)

## 2021-02-21 LAB — PROCALCITONIN: Procalcitonin: 4.32 ng/mL

## 2021-02-21 LAB — MAGNESIUM: Magnesium: 1.7 mg/dL (ref 1.7–2.4)

## 2021-02-21 IMAGING — DX DG CHEST 1V PORT
1 series · 1 of 1 positions shown · non-contrast
Comparison: Chest x-ray [DATE]

CLINICAL DATA: Aspiration pneumonia

EXAM:
PORTABLE CHEST 1 VIEW

[chest ap]
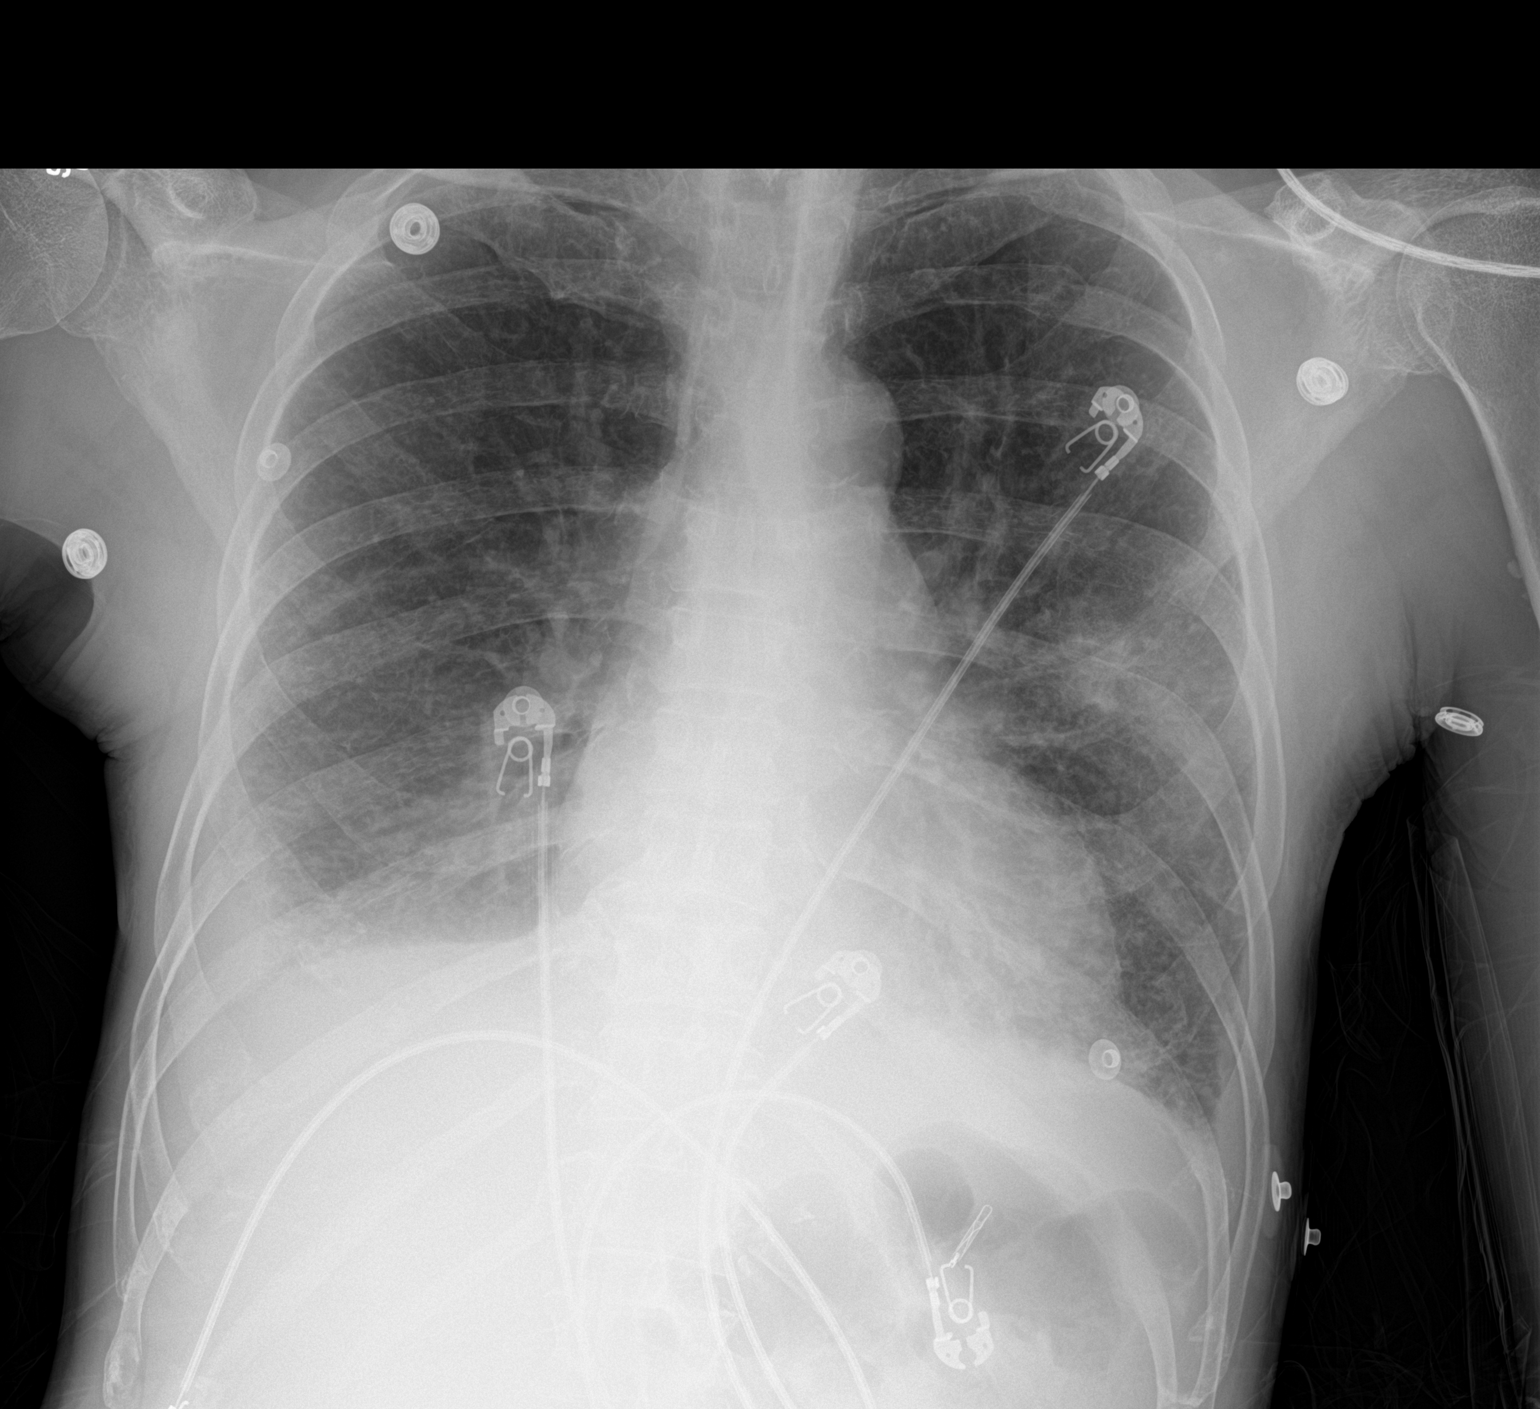

[1 of 1 positions shown; findings below may reference images not displayed]

FINDINGS: Cardiomediastinal silhouette is unchanged. Pulmonary vasculature
within normal limits. Interval increasing hazy opacity in the left
midlung zone. Small right and likely trace left pleural effusions
with associated infiltrates/atelectasis at the lung bases. No
pneumothorax.
IMPRESSION: 1. Increasing infiltrates in the left midlung zone since previous
study.
2. Small bilateral pleural effusions with associated
atelectasis/infiltrate at the lung bases, right greater than left.

## 2021-02-21 MED ORDER — AMOXICILLIN-POT CLAVULANATE 500-125 MG PO TABS: 1.0000 | ORAL_TABLET | Freq: Two times a day (BID) | ORAL | 0 refills | Status: DC

## 2021-02-21 MED ORDER — AMOXICILLIN-POT CLAVULANATE 875-125 MG PO TABS: 1.0000 | ORAL_TABLET | Freq: Two times a day (BID) | ORAL | 0 refills | Status: DC

## 2021-02-21 MED ORDER — PANTOPRAZOLE SODIUM 40 MG PO TBEC: 40.0000 mg | DELAYED_RELEASE_TABLET | Freq: Two times a day (BID) | ORAL | 1 refills | Status: DC

## 2021-02-21 NOTE — Discharge Instructions (Signed)
Follow with Primary MD [name], Ravisankar, MD in 7 days   Get CBC, CMP, 2 view Chest X ray -  checked next visit within 1 week by Primary MD    Activity: As tolerated with Full fall precautions use walker/cane & assistance as needed  Disposition Home    Diet: Heart Healthy    Special Instructions: If you have smoked or chewed Tobacco  in the last 2 yrs please stop smoking, stop any regular Alcohol  and or any Recreational drug use.  On your next visit with your primary care physician please Get Medicines reviewed and adjusted.  Please request your Prim.MD to go over all Hospital Tests and Procedure/Radiological results at the follow up, please get all Hospital records sent to your Prim MD by signing hospital release before you go home.  If you experience worsening of your admission symptoms, develop shortness of breath, life threatening emergency, suicidal or homicidal thoughts you must seek medical attention immediately by calling 911 or calling your MD immediately  if symptoms less severe.  You Must read complete instructions/literature along with all the possible adverse reactions/side effects for all the Medicines you take and that have been prescribed to you. Take any new Medicines after you have completely understood and accpet all the possible adverse reactions/side effects.

## 2021-02-21 NOTE — Progress Notes (Signed)
Discharge education and packet provided to patient at bedside, all questions and concerns addressed, wife at bedside for transport.

## 2021-02-21 NOTE — Discharge Summary (Signed)
[name]:[id number] DOB: June 22, 1960 DOA: 02/17/2021  PCP: [name], MD  Admit date: 02/17/2021  Discharge date: 02/21/2021  Admitted From: Home   Disposition:  Home   Recommendations for Outpatient Follow-up:   Follow up with PCP in 1-2 weeks  PCP Please obtain BMP/CBC, 2 view CXR in 1week,  (see Discharge instructions)   PCP Please follow up on the following pending results: Check final blood culture results, repeat CBC, BMP, CXR in 1 week   Home Health: None   Equipment/Devices: None Consultations: PCCM, GI Discharge Condition: Stable    CODE STATUS: Full    Diet Recommendation: Heart Healthy     Chief Complaint  Patient presents with   GI Bleeding     Brief history of present illness from the day of admission and additional interim summary     [name] is a 60 y.o. male with medical history significant of Scleroderma, CAD, CKD, HTN, HFpEF, AVMs GI tract with prior h/o needing transfusion. Earlier on the day of admission patient had increased DOE and also started passing bright red blod with clots per rectum, in the ER he was found to have severe anemia requiring transfusion and was admitted for GI bleed work-up.                                                                 Hospital Course   Acute lower GI bleed induced acute blood loss anemia with symptoms - he is s/p 2 units of packed RBC transfusion on 02/17/2021, posttransfusion H&H appears stable, no further signs of bleed on PPI, seen by GI underwent EGD and colonoscopy on 02/19/2021 each showing 2 nonbleeding angiodysplasias in stomach, duodenum and 2 in the colonic area all were APC'd, stable H&H when accounted for IVF for sepsis.   2.  CKD 3B.  Creatinine close to baseline.   3.  Chronic diastolic heart failure  EF 55%.  Currently compensated.  Continue low-dose diuretic and monitor.   4.  History of gout.  On allopurinol.   5.  Dyslipidemia.  On statin.   6.  History of pulmonary hypertension with Scleroderma.  On home medications along with supportive care.   7. Sepsis - aspiration pneumonia likely during EGD/colonoscopy.  He did have a dry cough, fevers, hypotension and leukocytosis, CXR and CT showed bilat infiltrates, was given IVF and IV ABX, now at baseline on RA, seen and cleared by PCCM on 1 week PO Augmentin, prelim cultures negative, Covid, Flu, MRSA PCR, Strep antigen all negative.     Discharge diagnosis     Active Problems:   Scleroderma ([place])   Chronic renal insufficiency, stage 3 (moderate) (HCC)   Gout   Hyperlipidemia   Essential hypertension   Raynaud's syndrome without gangrene   Pulmonary hypertension (  HCC)   Chronic systolic (congestive) heart failure (HCC)   Arteriovenous malformation of gastrointestinal tract   Acute lower GI bleeding   Acute blood loss anemia   Benign neoplasm of sigmoid colon   HCAP (healthcare-associated pneumonia)    Discharge instructions    Discharge Instructions     Diet - low sodium heart healthy   Complete by: As directed    Discharge instructions   Complete by: As directed    Follow with Primary MD [name], Ravisankar, MD in 7 days   Get CBC, CMP, 2 view Chest X ray -  checked next visit within 1 week by Primary MD    Activity: As tolerated with Full fall precautions use walker/cane & assistance as needed  Disposition Home    Diet: Heart Healthy    Special Instructions: If you have smoked or chewed Tobacco  in the last 2 yrs please stop smoking, stop any regular Alcohol  and or any Recreational drug use.  On your next visit with your primary care physician please Get Medicines reviewed and adjusted.  Please request your Prim.MD to go over all Hospital Tests and Procedure/Radiological results at the follow up, please get all  Hospital records sent to your Prim MD by signing hospital release before you go home.  If you experience worsening of your admission symptoms, develop shortness of breath, life threatening emergency, suicidal or homicidal thoughts you must seek medical attention immediately by calling 911 or calling your MD immediately  if symptoms less severe.  You Must read complete instructions/literature along with all the possible adverse reactions/side effects for all the Medicines you take and that have been prescribed to you. Take any new Medicines after you have completely understood and accpet all the possible adverse reactions/side effects.   Increase activity slowly   Complete by: As directed        Discharge Medications   Allergies as of 02/21/2021       Reactions   Oxycodone Other (See Comments)   Hallucinations        Medication List     STOP taking these medications    aspirin-sod bicarb-citric acid 325 MG Tbef tablet Commonly known as: ALKA-SELTZER       TAKE these medications    allopurinol 100 MG tablet Commonly known as: ZYLOPRIM TAKE 1 TABLET BY MOUTH EVERY DAY   ALPRAZolam 0.5 MG tablet Commonly known as: XANAX Take 0.25 mg by mouth 2 (two) times daily as needed for anxiety.   amoxicillin-clavulanate 500-125 MG tablet Commonly known as: Augmentin Take 1 tablet (500 mg total) by mouth in the morning and at bedtime.   colchicine 0.6 MG tablet Take 0.6 mg by mouth daily as needed (gout attacks).   guaiFENesin 600 MG 12 hr tablet Commonly known as: MUCINEX Take 600 mg by mouth 2 (two) times daily as needed for cough.   HYDROcodone-acetaminophen 10-325 MG tablet Commonly known as: NORCO Take 0.5-1 tablets by mouth every 6 (six) hours as needed for moderate pain.   methocarbamol 500 MG tablet Commonly known as: ROBAXIN Take 1 tablet (500 mg total) by mouth 2 (two) times daily as needed for muscle spasms.   multivitamin-iron-minerals-folic acid chewable  tablet Chew 1 tablet by mouth daily.   Opsumit 10 MG tablet Generic drug: macitentan Take 1 tablet (10 mg total) by mouth daily.   pantoprazole 40 MG tablet Commonly known as: PROTONIX Take 1 tablet (40 mg total) by mouth 2 (two) times daily before a meal. What  changed: when to take this   potassium chloride SA 20 MEQ tablet Commonly known as: Klor-Con M20 Take 1 tablet (20 mEq total) by mouth 2 (two) times daily. What changed: when to take this   RETACRIT IJ Inject as directed every 30 (thirty) days.   rosuvastatin 20 MG tablet Commonly known as: CRESTOR Take 20 mg by mouth daily.   sildenafil 20 MG tablet Commonly known as: REVATIO Take 2 tablets (40 mg total) by mouth 3 (three) times daily.   torsemide 20 MG tablet Commonly known as: DEMADEX TAKE 1 TABLET BY MOUTH TWICE A DAY What changed: when to take this         Follow-up Information     [name], Ravisankar, MD. Schedule an appointment as soon as possible for a visit in 1 week(s).   Specialty: Internal Medicine Contact information: [place] [address] [phone]         Lorretta Harp, MD .   Specialties: Cardiology, Radiology Contact information: [address] [place] [address] [phone]         Bensimhon, Shaune Pascal, MD. Schedule an appointment as soon as possible for a visit in 1 week(s).   Specialty: Cardiology Contact information: [address] [place] [address] [phone]         Brand Males, MD. Schedule an appointment as soon as possible for a visit in 1 week(s).   Specialty: Pulmonary Disease Contact information: [place] [address] [phone]                 Major procedures and Radiology Reports - PLEASE review detailed and final reports thoroughly  -       DG Chest 2 View  Result Date: 02/17/2021 CLINICAL DATA:  Shortness of breath EXAM: CHEST - 2 VIEW  COMPARISON:  Chest radiograph dated 11/08/2019 and CT chest dated 01/04/2021. FINDINGS: The heart size and mediastinal contours are within normal limits. Vascular calcifications are seen in the aortic arch. There is a small right pleural effusion with associated atelectasis. Mild bibasilar reticular opacities are consistent with a chronic process. There is no left pleural effusion. There is no pneumothorax. Degenerative changes are seen in the spine. IMPRESSION: Small right pleural effusion with associated atelectasis. Aortic Atherosclerosis (ICD10-I70.0). Electronically Signed   By: Zerita Boers M.D.   On: 02/17/2021 13:23   CT CHEST WO CONTRAST  Result Date: 02/20/2021 CLINICAL DATA:  Pneumonia EXAM: CT CHEST WITHOUT CONTRAST TECHNIQUE: Multidetector CT imaging of the chest was performed following the standard protocol without IV contrast. COMPARISON:  Previous studies including the chest radiographs done on 02/20/2021 FINDINGS: Cardiovascular: Coronary artery calcifications are seen. There is ectasia of main pulmonary artery measuring 3.3 cm Mediastinum/Nodes: There are slightly enlarged lymph nodes in mediastinum. Lungs/Pleura: Moderate right pleural effusion is present. New patchy ground-glass infiltrates are seen in left upper lobe, left lower lobe and right lower lobe. No definite cavitary lesions are seen. Some of the infiltrates have a nodular appearance. Centrilobular emphysema is seen. There is no pneumothorax. Upper Abdomen: Unremarkable Musculoskeletal: Unremarkable IMPRESSION: There are new patchy ground-glass infiltrates in left upper lobe and both lower lobes suggesting bilateral pneumonia. There is no demonstrable cavitary lesion in the lung fields. COPD. Moderate right pleural effusion. Coronary artery calcifications are seen. Electronically Signed   By: Elmer Picker M.D.   On: 02/20/2021 14:34   DG Chest Port 1 View  Result Date: 02/21/2021 CLINICAL  DATA:  Aspiration pneumonia  EXAM: PORTABLE CHEST 1 VIEW COMPARISON:  Chest x-ray 02/20/2021 FINDINGS: Cardiomediastinal silhouette is unchanged. Pulmonary vasculature within normal limits. Interval increasing hazy opacity in the left midlung zone. Small right and likely trace left pleural effusions with associated infiltrates/atelectasis at the lung bases. No pneumothorax. IMPRESSION: 1. Increasing infiltrates in the left midlung zone since previous study. 2. Small bilateral pleural effusions with associated atelectasis/infiltrate at the lung bases, right greater than left. Electronically Signed   By: Ofilia Neas M.D.   On: 02/21/2021 08:57   DG Chest Port 1 View  Result Date: 02/20/2021 CLINICAL DATA:  short of breath EXAM: PORTABLE CHEST 1 VIEW COMPARISON:  Radiograph 02/19/2021, CT 01/04/2021 FINDINGS: Normal cardiac silhouette. Decreased density the airspace disease seen in the mid LEFT lung on comparison radiograph. However, there is a new central lucency at this location which suggest potential cavitation. RIGHT basilar effusion is similar to prior. Chronic bronchitic markings throughout the lungs. IMPRESSION: 1. New cavitation of airspace opacity/pneumonia seen on comparison radiograph. 2. Stable RIGHT lower lobe pleural effusion. Electronically Signed   By: Suzy Bouchard M.D.   On: 02/20/2021 08:59   DG CHEST PORT 1 VIEW  Result Date: 02/19/2021 CLINICAL DATA:  Sepsis. EXAM: PORTABLE CHEST 1 VIEW COMPARISON:  Radiograph 2 days ago 02/17/2021, chest CT 01/04/2021 FINDINGS: The heart is normal in size. Unchanged mediastinal contours. Right pleural effusion has slightly increased from prior exam. Associated basilar opacity. There is a new patchy opacity in the left mid lung. Peripheral interstitial thickening which was better appreciated on prior CT. No visualized left pleural effusion. No pneumothorax. Stable osseous structures. IMPRESSION: 1. Slight increase in size of right pleural effusion with associated basilar  opacity. 2. New patchy opacity in the left mid lung, suspicious for pneumonia. 3. Interstitial lung disease previously characterized on high-resolution chest CT. Electronically Signed   By: Keith Rake M.D.   On: 02/19/2021 22:50    Micro Results     Recent Results (from the past 240 hour(s))  Resp Panel by RT-PCR (Flu A&B, Covid) Nasopharyngeal Swab     Status: None   Collection Time: 02/17/21  4:17 PM   Specimen: Nasopharyngeal Swab; Nasopharyngeal(NP) swabs in vial transport medium  Result Value Ref Range Status   SARS Coronavirus 2 by RT PCR NEGATIVE NEGATIVE Final    Comment: (NOTE) SARS-CoV-2 target nucleic acids are NOT DETECTED.  The SARS-CoV-2 RNA is generally detectable in upper respiratory specimens during the acute phase of infection. The lowest concentration of SARS-CoV-2 viral copies this assay can detect is 138 copies/mL. A negative result does not preclude SARS-Cov-2 infection and should not be used as the sole basis for treatment or other patient management decisions. A negative result may occur with  improper specimen collection/handling, submission of specimen other than nasopharyngeal swab, presence of viral mutation(s) within the areas targeted by this assay, and inadequate number of viral copies(<138 copies/mL). A negative result must be combined with clinical observations, patient history, and epidemiological information. The expected result is Negative.  Fact Sheet for Patients:  EntrepreneurPulse.com.au  Fact Sheet for Healthcare Providers:  IncredibleEmployment.be  This test is no t yet approved or cleared by the [place] FDA and  has been authorized for detection and/or diagnosis of SARS-CoV-2 by FDA under an Emergency Use Authorization (EUA). This EUA will remain  in effect (meaning this test can be used) for the duration of the COVID-19 declaration under Section 564(b)(1) of the Act, 21 U.S.C.section  360bbb-3(b)(1),  unless the authorization is terminated  or revoked sooner.       Influenza A by PCR NEGATIVE NEGATIVE Final   Influenza B by PCR NEGATIVE NEGATIVE Final    Comment: (NOTE) The Xpert Xpress SARS-CoV-2/FLU/RSV plus assay is intended as an aid in the diagnosis of influenza from Nasopharyngeal swab specimens and should not be used as a sole basis for treatment. Nasal washings and aspirates are unacceptable for Xpert Xpress SARS-CoV-2/FLU/RSV testing.  Fact Sheet for Patients: EntrepreneurPulse.com.au  Fact Sheet for Healthcare Providers: IncredibleEmployment.be  This test is not yet approved or cleared by the [place] FDA and has been authorized for detection and/or diagnosis of SARS-CoV-2 by FDA under an Emergency Use Authorization (EUA). This EUA will remain in effect (meaning this test can be used) for the duration of the COVID-19 declaration under Section 564(b)(1) of the Act, 21 U.S.C. section 360bbb-3(b)(1), unless the authorization is terminated or revoked.  Performed at [facility], [place] [address], [place], [place] [address]   Culture, blood (routine x 2)     Status: None (Preliminary result)   Collection Time: 02/19/21 11:05 PM   Specimen: BLOOD  Result Value Ref Range Status   Specimen Description BLOOD RIGHT ANTECUBITAL  Final   Special Requests   Final    BOTTLES DRAWN AEROBIC AND ANAEROBIC Blood Culture results may not be optimal due to an inadequate volume of blood received in culture bottles   Culture   Final    NO GROWTH 1 DAY Performed at [facility], [place] [address] [place], [place] [address]    Report Status PENDING  Incomplete  Culture, blood (routine x 2)     Status: None (Preliminary result)   Collection Time: 02/19/21 11:17 PM   Specimen: BLOOD RIGHT HAND  Result Value Ref Range Status   Specimen Description BLOOD RIGHT HAND  Final   Special Requests   Final    BOTTLES DRAWN  AEROBIC AND ANAEROBIC Blood Culture results may not be optimal due to an inadequate volume of blood received in culture bottles   Culture   Final    NO GROWTH 1 DAY Performed at [facility], [place] [address], [place], [place] [address]    Report Status PENDING  Incomplete  Resp Panel by RT-PCR (Flu A&B, Covid) Nasopharyngeal Swab     Status: None   Collection Time: 02/19/21 11:35 PM   Specimen: Nasopharyngeal Swab; Nasopharyngeal(NP) swabs in vial transport medium  Result Value Ref Range Status   SARS Coronavirus 2 by RT PCR NEGATIVE NEGATIVE Final    Comment: (NOTE) SARS-CoV-2 target nucleic acids are NOT DETECTED.  The SARS-CoV-2 RNA is generally detectable in upper respiratory specimens during the acute phase of infection. The lowest concentration of SARS-CoV-2 viral copies this assay can detect is 138 copies/mL. A negative result does not preclude SARS-Cov-2 infection and should not be used as the sole basis for treatment or other patient management decisions. A negative result may occur with  improper specimen collection/handling, submission of specimen other than nasopharyngeal swab, presence of viral mutation(s) within the areas targeted by this assay, and inadequate number of viral copies(<138 copies/mL). A negative result must be combined with clinical observations, patient history, and epidemiological information. The expected result is Negative.  Fact Sheet for Patients:  EntrepreneurPulse.com.au  Fact Sheet for Healthcare Providers:  IncredibleEmployment.be  This test is no t yet approved or cleared by the [place] FDA and  has been authorized for detection  and/or diagnosis of SARS-CoV-2 by FDA under an Emergency Use Authorization (EUA). This EUA will remain  in effect (meaning this test can be used) for the duration of the COVID-19 declaration under Section 564(b)(1) of the Act, 21 U.S.C.section 360bbb-3(b)(1), unless the  authorization is terminated  or revoked sooner.       Influenza A by PCR NEGATIVE NEGATIVE Final   Influenza B by PCR NEGATIVE NEGATIVE Final    Comment: (NOTE) The Xpert Xpress SARS-CoV-2/FLU/RSV plus assay is intended as an aid in the diagnosis of influenza from Nasopharyngeal swab specimens and should not be used as a sole basis for treatment. Nasal washings and aspirates are unacceptable for Xpert Xpress SARS-CoV-2/FLU/RSV testing.  Fact Sheet for Patients: EntrepreneurPulse.com.au  Fact Sheet for Healthcare Providers: IncredibleEmployment.be  This test is not yet approved or cleared by the [place] FDA and has been authorized for detection and/or diagnosis of SARS-CoV-2 by FDA under an Emergency Use Authorization (EUA). This EUA will remain in effect (meaning this test can be used) for the duration of the COVID-19 declaration under Section 564(b)(1) of the Act, 21 U.S.C. section 360bbb-3(b)(1), unless the authorization is terminated or revoked.  Performed at [facility], [place] [address], [place], [place] [address]   Respiratory (~20 pathogens) panel by PCR     Status: None   Collection Time: 02/19/21 11:35 PM   Specimen: Nasopharyngeal Swab; Respiratory  Result Value Ref Range Status   Adenovirus NOT DETECTED NOT DETECTED Final   Coronavirus 229E NOT DETECTED NOT DETECTED Final    Comment: (NOTE) The Coronavirus on the Respiratory Panel, DOES NOT test for the novel  Coronavirus (2019 nCoV)    Coronavirus HKU1 NOT DETECTED NOT DETECTED Final   Coronavirus NL63 NOT DETECTED NOT DETECTED Final   Coronavirus OC43 NOT DETECTED NOT DETECTED Final   Metapneumovirus NOT DETECTED NOT DETECTED Final   Rhinovirus / Enterovirus NOT DETECTED NOT DETECTED Final   Influenza A NOT DETECTED NOT DETECTED Final   Influenza B NOT DETECTED NOT DETECTED Final   Parainfluenza Virus 1 NOT DETECTED NOT DETECTED Final   Parainfluenza Virus 2  NOT DETECTED NOT DETECTED Final   Parainfluenza Virus 3 NOT DETECTED NOT DETECTED Final   Parainfluenza Virus 4 NOT DETECTED NOT DETECTED Final   Respiratory Syncytial Virus NOT DETECTED NOT DETECTED Final   Bordetella pertussis NOT DETECTED NOT DETECTED Final   Bordetella Parapertussis NOT DETECTED NOT DETECTED Final   Chlamydophila pneumoniae NOT DETECTED NOT DETECTED Final   Mycoplasma pneumoniae NOT DETECTED NOT DETECTED Final    Comment: Performed at [facility], [place]. [address], [place], [place] [address]  MRSA Next Gen by PCR, Nasal     Status: None   Collection Time: 02/20/21  1:10 AM   Specimen: Nasal Mucosa; Nasal Swab  Result Value Ref Range Status   MRSA by PCR Next Gen NOT DETECTED NOT DETECTED Final    Comment: (NOTE) The GeneXpert MRSA Assay (FDA approved for NASAL specimens only), is one component of a comprehensive MRSA colonization surveillance program. It is not intended to diagnose MRSA infection nor to guide or monitor treatment for MRSA infections. Test performance is not FDA approved in patients less than 39 years old. Performed at [facility], [place] [address], [place], [place] [address]     Today   Subjective    [name] today has no headache,no chest abdominal pain,no new weakness tingling or numbness, feels much better wants to go home  today.     Objective   Blood pressure 104/70, pulse 80, temperature 98 F (36.7 C), temperature source Oral, resp. rate 19, height _0  (1.727 m), weight 51 kg, SpO2 99 %.   Intake/Output Summary (Last 24 hours) at 02/21/2021 0931 Last data filed at 02/21/2021 0645 Gross per 24 hour  Intake 1568.18 ml  Output 1025 ml  Net 543.18 ml    Exam  Awake Alert, No new F.N deficits, Normal affect .AT,PERRAL Supple Neck,No JVD, No cervical lymphadenopathy appriciated.  Symmetrical Chest wall movement, Good air movement bilaterally, CTAB RRR,No Gallops,Rubs or new Murmurs, No Parasternal  Heave +ve B.Sounds, Abd Soft, Non tender, No organomegaly appriciated, No rebound -guarding or rigidity. No Cyanosis, Clubbing or edema, No new Rash or bruise   Data Review   CBC w Diff:  Lab Results  Component Value Date   WBC 10.8 (H) 02/21/2021   HGB 7.7 (L) 02/21/2021   HGB 10.5 (L) 06/11/2018   HCT 23.8 (L) 02/21/2021   HCT 33.1 (L) 06/11/2018   PLT 149 (L) 02/21/2021   PLT 222 06/11/2018   LYMPHOPCT 8 02/21/2021   MONOPCT 5 02/21/2021   EOSPCT 1 02/21/2021   BASOPCT 0 02/21/2021    CMP:  Lab Results  Component Value Date   NA 138 02/21/2021   NA 145 (H) 06/11/2018   K 4.2 02/21/2021   CL 114 (H) 02/21/2021   CO2 16 (L) 02/21/2021   BUN 53 (H) 02/21/2021   BUN 34 (H) 06/11/2018   CREATININE 2.71 (H) 02/21/2021   CREATININE 3.21 (H) 02/02/2021   PROT 5.1 (L) 02/21/2021   ALBUMIN 2.2 (L) 02/21/2021   BILITOT 0.5 02/21/2021   ALKPHOS 69 02/21/2021   AST 28 02/21/2021   ALT 38 02/21/2021  .   Total Time in preparing paper work, data evaluation and todays exam - 90 minutes  Lala Lund M.D on 02/21/2021 at 9:31 AM  [facility]

## 2021-02-22 ENCOUNTER — Ambulatory Visit: Payer: 59 | Admitting: Gastroenterology

## 2021-02-22 LAB — SURGICAL PATHOLOGY

## 2021-02-23 ENCOUNTER — Telehealth: Payer: Self-pay | Admitting: Gastroenterology

## 2021-02-23 ENCOUNTER — Telehealth (HOSPITAL_COMMUNITY): Payer: Self-pay

## 2021-02-23 NOTE — Telephone Encounter (Signed)
Patient needs hospital follow up appointment.  Please contact patient.

## 2021-02-23 NOTE — Telephone Encounter (Signed)
Patient discharged from [facility] after having been hospitalized 5 days for a GI bleed. Had procedure while in hospital. Discharge instructions said he needed to be seen within a week.  Dr. Lurline Del first appointment was 12/8, which I went ahead and scheduled.  Is there anyway he can be seen sooner rather than later?  Please call and advise.  Thank you.,

## 2021-02-23 NOTE — Telephone Encounter (Signed)
Returned call to patient's wife, pt has been scheduled for a follow up with Dr. Bryan Lemma on Thursday, 02/24/21 at 10 am. Pt's wife is aware that this appt is at the [facility] office. Pt's wife verbalized understanding and had no concerns at the end of the call. Lanny Hurst, CMA is aware that patient has been added to schedule.

## 2021-02-24 ENCOUNTER — Ambulatory Visit (INDEPENDENT_AMBULATORY_CARE_PROVIDER_SITE_OTHER): Payer: 59 | Admitting: Gastroenterology

## 2021-02-24 ENCOUNTER — Other Ambulatory Visit: Payer: Self-pay

## 2021-02-24 ENCOUNTER — Encounter: Payer: Self-pay | Admitting: Gastroenterology

## 2021-02-24 VITALS — BP 132/74 | HR 72 | Ht 68.0 in | Wt 111.5 lb

## 2021-02-24 DIAGNOSIS — I519 Heart disease, unspecified: Secondary | ICD-10-CM

## 2021-02-24 DIAGNOSIS — K5520 Angiodysplasia of colon without hemorrhage: Secondary | ICD-10-CM | POA: Diagnosis not present

## 2021-02-24 DIAGNOSIS — Z86010 Personal history of colonic polyps: Secondary | ICD-10-CM

## 2021-02-24 DIAGNOSIS — K219 Gastro-esophageal reflux disease without esophagitis: Secondary | ICD-10-CM | POA: Diagnosis not present

## 2021-02-24 DIAGNOSIS — Z8774 Personal history of (corrected) congenital malformations of heart and circulatory system: Secondary | ICD-10-CM | POA: Diagnosis not present

## 2021-02-24 DIAGNOSIS — I509 Heart failure, unspecified: Secondary | ICD-10-CM

## 2021-02-24 DIAGNOSIS — N184 Chronic kidney disease, stage 4 (severe): Secondary | ICD-10-CM
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
## 2021-02-24 NOTE — Patient Instructions (Signed)
If you are age 62 or older, your body mass index should be between 23-30. Your Body mass index is 16.95 kg/m. If this is out of the aforementioned range listed, please consider follow up with your Primary Care Provider.  If you are age 54 or younger, your body mass index should be between 19-25. Your Body mass index is 16.95 kg/m. If this is out of the aformentioned range listed, please consider follow up with your Primary Care Provider.   __________________________________________________________  The [facility] providers would like to encourage you to use [facility] to communicate with providers for non-urgent requests or questions.  Due to long hold times on the telephone, sending your provider a message by [facility] may be a faster and more efficient way to get a response.  Please allow 48 business hours for a response.  Please remember that this is for non-urgent requests.   Due to recent changes in healthcare laws, you may see the results of your imaging and laboratory studies on MyChart before your provider has had a chance to review them.  We understand that in some cases there may be results that are confusing or concerning to you. Not all laboratory results come back in the same time frame and the provider may be waiting for multiple results in order to interpret others.  Please give [place] 48 hours in order for your provider to thoroughly review all the results before contacting the office for clarification of your results.   Please have your nephrologist send [place] your lab results.  Return to the clinic in 3 months, please call us in January 2023 to schedule for February  Thank you for choosing me and [facility].  Vito Cirigliano, D.O.

## 2021-02-25 LAB — CULTURE, BLOOD (ROUTINE X 2)
Culture: NO GROWTH
Culture: NO GROWTH

## 2021-02-28 ENCOUNTER — Other Ambulatory Visit: Payer: Self-pay

## 2021-02-28 ENCOUNTER — Telehealth: Payer: Self-pay | Admitting: Internal Medicine

## 2021-02-28 ENCOUNTER — Ambulatory Visit (INDEPENDENT_AMBULATORY_CARE_PROVIDER_SITE_OTHER): Payer: 59 | Admitting: Internal Medicine

## 2021-02-28 ENCOUNTER — Encounter: Payer: Self-pay | Admitting: Internal Medicine

## 2021-02-28 VITALS — BP 118/70 | HR 90 | Ht 68.0 in | Wt 109.8 lb

## 2021-02-28 DIAGNOSIS — Z79899 Other long term (current) drug therapy: Secondary | ICD-10-CM

## 2021-02-28 DIAGNOSIS — J9859 Other diseases of mediastinum, not elsewhere classified: Secondary | ICD-10-CM

## 2021-02-28 DIAGNOSIS — F17210 Nicotine dependence, cigarettes, uncomplicated: Secondary | ICD-10-CM

## 2021-02-28 DIAGNOSIS — J8489 Other specified interstitial pulmonary diseases: Secondary | ICD-10-CM | POA: Diagnosis not present

## 2021-02-28 DIAGNOSIS — Z111 Encounter for screening for respiratory tuberculosis: Secondary | ICD-10-CM

## 2021-02-28 DIAGNOSIS — M349 Systemic sclerosis, unspecified: Secondary | ICD-10-CM

## 2021-02-28 DIAGNOSIS — I2510 Atherosclerotic heart disease of native coronary artery without angina pectoris: Secondary | ICD-10-CM

## 2021-02-28 DIAGNOSIS — M359 Systemic involvement of connective tissue, unspecified: Secondary | ICD-10-CM

## 2021-02-28 DIAGNOSIS — I2721 Secondary pulmonary arterial hypertension: Secondary | ICD-10-CM

## 2021-02-28 DIAGNOSIS — I2723 Pulmonary hypertension due to lung diseases and hypoxia: Secondary | ICD-10-CM

## 2021-02-28 NOTE — Addendum Note (Signed)
Addended by: Valerie Salts on: 02/28/2021 09:39 AM   Modules accepted: Orders

## 2021-02-28 NOTE — Telephone Encounter (Signed)
  For radiology addendum:   - please call radiologist assistant line [phone] and specify CRANSTON KOORS , 1960/11/29 and [id number] - mention imaging type HRCT and date sept 2022 - request addendum for purpose of - compare to all CT abd and lung prior and report when onset of ILD and progression since when   Please send phone message back when done  Thanks    SIGNATURE    Dr. Brand Males, M.D., F.C.C.P,  Pulmonary and Critical Care Medicine Staff Physician, [place] Director - Interstitial Lung Disease  Program  Pulmonary [place] [facility] [place], [place], [address]  Pager: [phone], If no answer  OR between  19:00-7:00h: page [phone] Telephone (clinical office): [phone] Telephone (research): [phone]  9:26 AM 02/28/2021

## 2021-02-28 NOTE — Telephone Encounter (Signed)
[name]  He has scleorderma ILD - can you look at [facility] for Actemra please. He has bad CKD and has lean BMI and has AVM. So ofev is out  Thanks  MR
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
## 2021-02-28 NOTE — Patient Instructions (Addendum)
Scleroderma (HCC) ILD (interstitial lung disease) ([place])  - at high risk for progression and you have atleast moderate disease  - given bleeding issues, lean weight, chronic kidney diesease - will not recommend ofev at the moment  Pl;an  - have asked pharmacist to investigate if we can give you an injection called actemra  - do spiro/dlc0 in 3 months - CMA to do comparison CT report   Cigarette smoker  - please quit  Mediastinal mass  - ensure PET scan and followup with Dr [name]  [facility] group 1 pulmonary arterial hypertension ([place]) WHO group 3 pulmonary arterial hypertension ([place])  - meds per Dr Vaughan Browner  Followup  - 3 months with Dr Chase Caller -30 min visit but after pft testing

## 2021-03-01 ENCOUNTER — Ambulatory Visit (HOSPITAL_COMMUNITY)
Admission: RE | Admit: 2021-03-01 | Discharge: 2021-03-01 | Disposition: A | Discharge status: Home / Self Care | Payer: Medicare Other | Source: Ambulatory Visit | Attending: Thoracic Surgery (Cardiothoracic Vascular Surgery) | Admitting: Thoracic Surgery (Cardiothoracic Vascular Surgery)

## 2021-03-01 DIAGNOSIS — I3139 Other pericardial effusion (noninflammatory): Secondary | ICD-10-CM | POA: Insufficient documentation

## 2021-03-01 DIAGNOSIS — J90 Pleural effusion, not elsewhere classified: Secondary | ICD-10-CM | POA: Insufficient documentation

## 2021-03-01 LAB — GLUCOSE, CAPILLARY: Glucose-Capillary: 88 mg/dL (ref 70–99)

## 2021-03-01 IMAGING — PT NM PET TUM IMG INITIAL (PI) SKULL BASE T - THIGH
8 series · 25 of 25 positions shown · non-contrast
Comparison: CT [DATE]

CLINICAL DATA: Initial treatment strategy for mediastinal mass.

EXAM:
NUCLEAR MEDICINE PET SKULL BASE TO THIGH
TECHNIQUE: 5.5 mCi F-18 FDG was injected intravenously. Full-ring PET imaging
was performed from the skull base to thigh after the radiotracer. CT
data was obtained and used for attenuation correction and anatomic
localization.
Fasting blood glucose: 88 mg/dl

[Series 3: pet sk_thigh ac · axial · 5.0mm · 4.07mm/px · z∈[-1360,-488]mm · 5 of 219 slices shown]
[im 1/219]
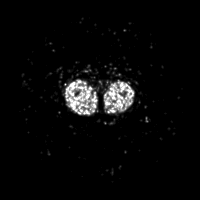
[im 55/219]
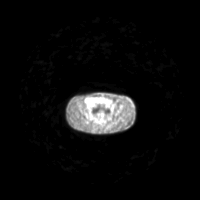
[im 110/219]
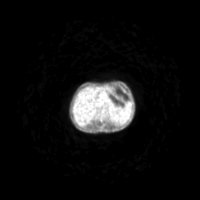
[im 164/219]
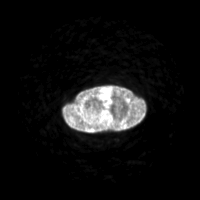
[im 219/219]
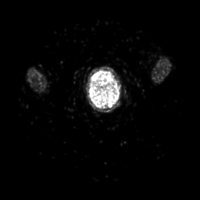

[Series 4: ct sk_thigh 5.0 bf37 · axial · 5.0mm · 0.84mm/px · z∈[-1360,-488]mm · 5 of 219 slices shown]
[im 1/219]
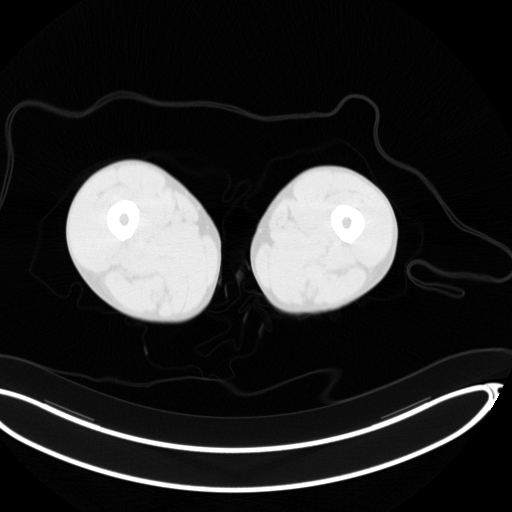
[im 55/219]
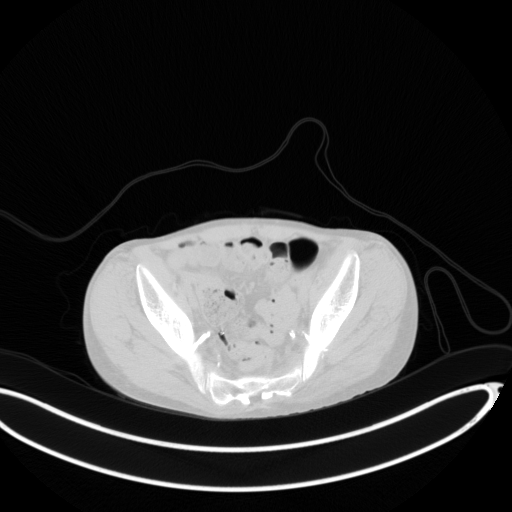
[im 110/219]
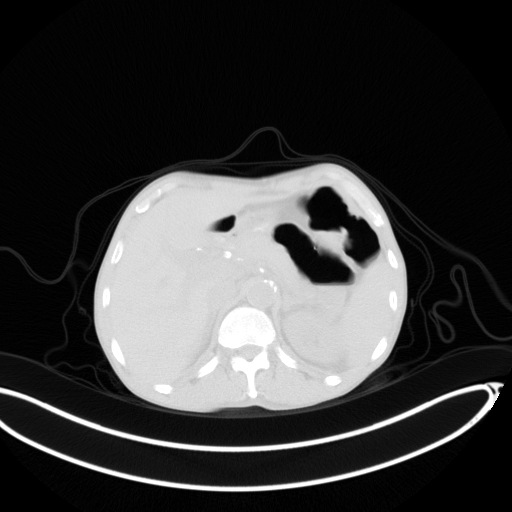
[im 164/219]
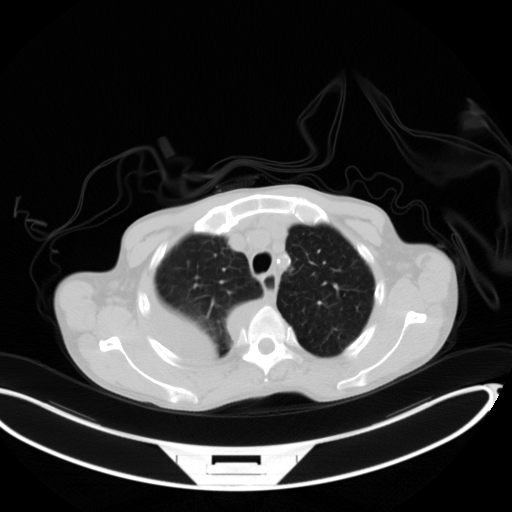
[im 219/219  brain]
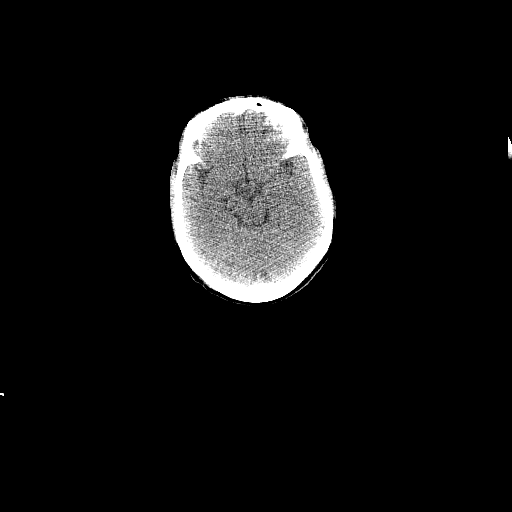

[Series 5: pet sk_thigh nac · axial · 5.0mm · 4.07mm/px · z∈[-1360,-488]mm · 5 of 219 slices shown]
[im 1/219]
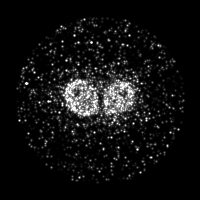
[im 55/219]
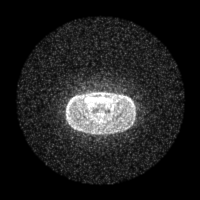
[im 110/219]
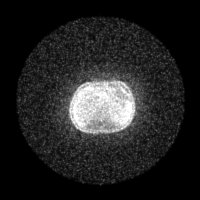
[im 164/219]
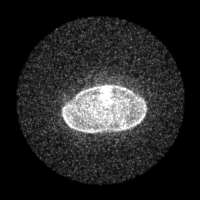
[im 219/219]
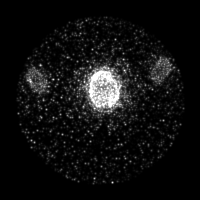

[Series 8: ct sk_thigh 5.0 br59 lung_bone · axial · 5.0mm · 0.57mm/px · z∈[-920,-636]mm · 2 of 72 slices shown]
[im 1/72]
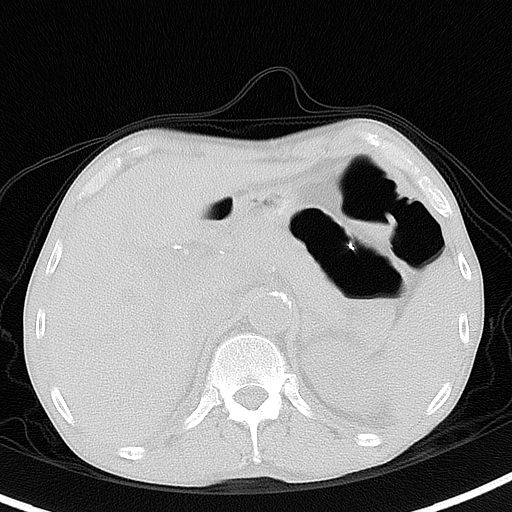
[im 72/72]
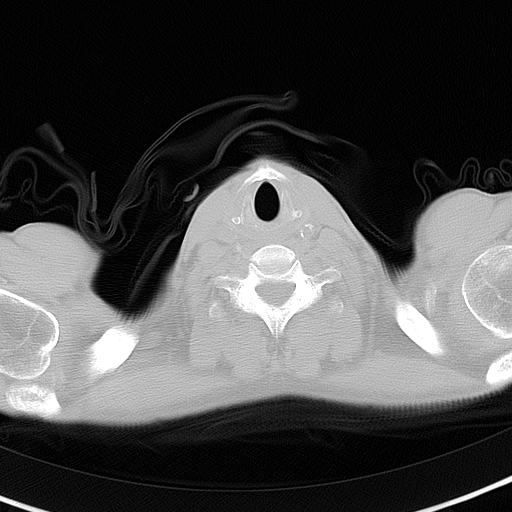

[Series 603: fused cor · 1 of 34 slices shown]
[im 1/34]
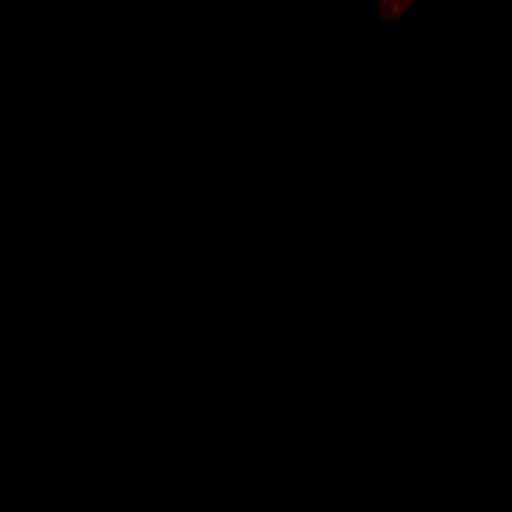

[Series 604: <mip collection> · coronal · 1.81mm/px · 1 of 32 slices shown]
[im 1/32]
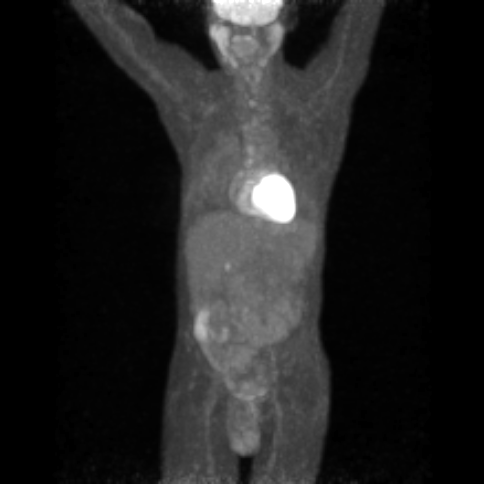

[Series 605: range-ct sk_thigh 5.0 bf37-tra-<alpha range> · 5 of 213 slices shown]
[im 1/213]
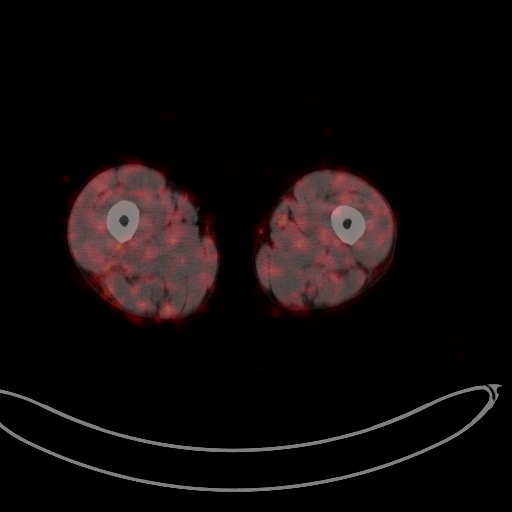
[im 54/213]
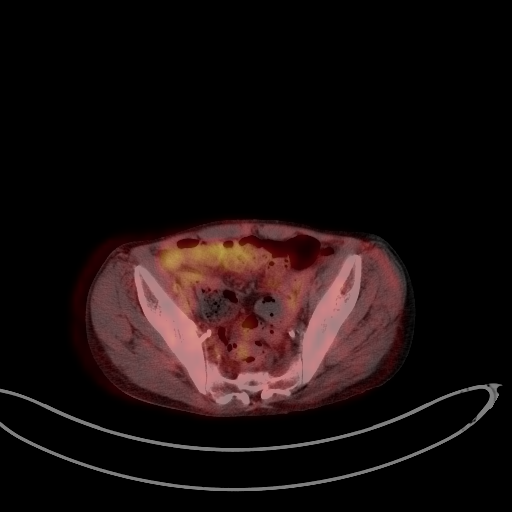
[im 107/213]
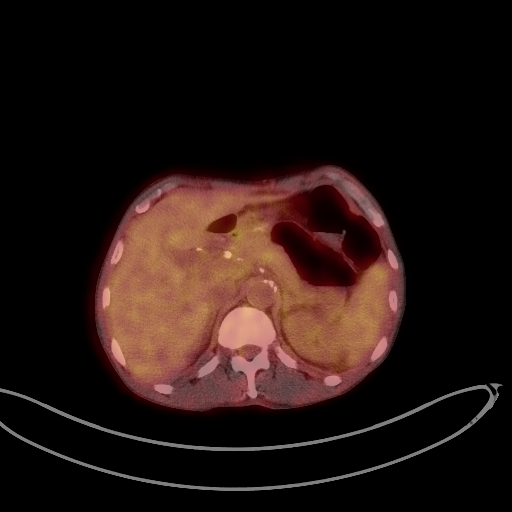
[im 160/213]
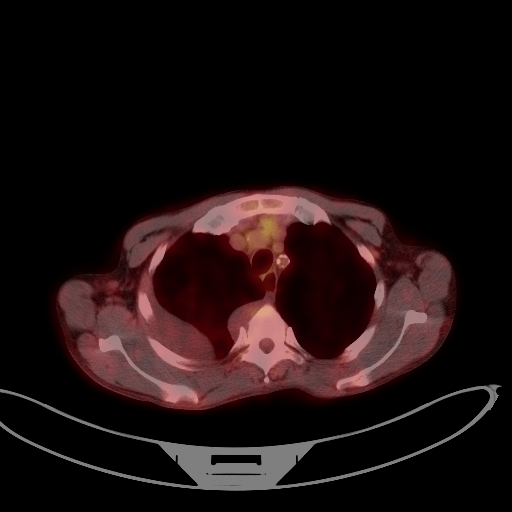
[im 213/213]
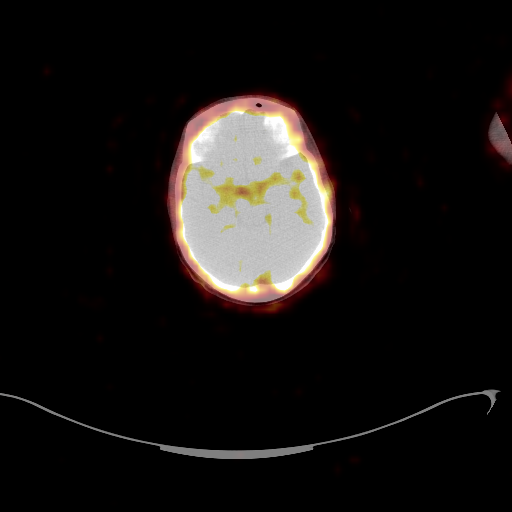

[Series 1060: results mm oncology reading · 0.8mm · 0.86mm/px · 1 of 2 slices shown]
[im 1/2]
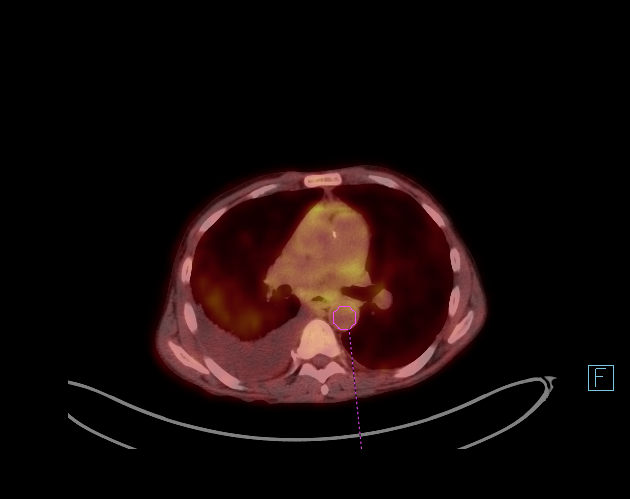

[25 of 25 positions shown; findings below may reference images not displayed]

FINDINGS: Mediastinal blood pool activity: SUV max

Liver activity: SUV max NA

NECK: No hypermetabolic lymph nodes in the neck.

Incidental CT findings: none

CHEST: Again demonstrated soft tissue thickening in the anterior
mediastinum which appears to represent a collection of small nodes.
One of the larger nodes has radiotracer activity SUV max equal
on image 168. This lesion measures 9 mm short axis.

No discrete hypermetabolic nodes otherwise in the mediastinum.

Incidental CT findings: Moderate to large layering LEFT pleural
RIGHT pleural effusion

ABDOMEN/PELVIS: Hypermetabolic activity in the RIGHT lower quadrant
localizing to the ascending colon. No CT lesion identified. No
hypermetabolic abdominopelvic lymph nodes.

Incidental CT findings: none

SKELETON: No focal hypermetabolic activity to suggest skeletal
metastasis.

Incidental CT findings: none
IMPRESSION: 1. Low metabolic activity associated cluster of nodules in the
anterior mediastinum. Favor benign tissue.
2. No evidence of mediastinal lymphadenopathy.
3. Moderate RIGHT pleural effusion.
4. Activity localizing to the RIGHT colon without CT correlation is
favored benign.

## 2021-03-01 MED ORDER — FLUDEOXYGLUCOSE F - 18 (FDG) INJECTION
5.4800 | Freq: Once | INTRAVENOUS | Status: AC | PRN
  Administered 2021-03-01: 5.48 via INTRAVENOUS

## 2021-03-01 NOTE — Telephone Encounter (Signed)
[facility] and spoke with Diane about the addendum that MR was wanting to having done. Diane stated she would send this to Dr. Laqueta Carina for the addendum to be done.  Routing back to MR to be on the look out for.

## 2021-03-02 ENCOUNTER — Encounter: Payer: Self-pay | Admitting: Rheumatology

## 2021-03-02 ENCOUNTER — Ambulatory Visit (INDEPENDENT_AMBULATORY_CARE_PROVIDER_SITE_OTHER): Payer: 59 | Admitting: Rheumatology

## 2021-03-02 ENCOUNTER — Other Ambulatory Visit: Payer: Self-pay

## 2021-03-02 VITALS — BP 151/89 | HR 76 | Ht 69.0 in | Wt 112.6 lb

## 2021-03-02 DIAGNOSIS — E43 Unspecified severe protein-calorie malnutrition: Secondary | ICD-10-CM

## 2021-03-02 DIAGNOSIS — D649 Anemia, unspecified: Secondary | ICD-10-CM

## 2021-03-02 DIAGNOSIS — M349 Systemic sclerosis, unspecified: Secondary | ICD-10-CM | POA: Diagnosis not present

## 2021-03-02 DIAGNOSIS — I7300 Raynaud's syndrome without gangrene: Secondary | ICD-10-CM | POA: Diagnosis not present

## 2021-03-02 DIAGNOSIS — N183 Chronic kidney disease, stage 3 unspecified: Secondary | ICD-10-CM

## 2021-03-02 DIAGNOSIS — I2510 Atherosclerotic heart disease of native coronary artery without angina pectoris: Secondary | ICD-10-CM

## 2021-03-02 DIAGNOSIS — I10 Essential (primary) hypertension: Secondary | ICD-10-CM

## 2021-03-02 DIAGNOSIS — I272 Pulmonary hypertension, unspecified: Secondary | ICD-10-CM

## 2021-03-02 DIAGNOSIS — Z8709 Personal history of other diseases of the respiratory system: Secondary | ICD-10-CM

## 2021-03-02 DIAGNOSIS — I3139 Other pericardial effusion (noninflammatory): Secondary | ICD-10-CM

## 2021-03-02 DIAGNOSIS — F17200 Nicotine dependence, unspecified, uncomplicated: Secondary | ICD-10-CM

## 2021-03-02 DIAGNOSIS — M1A09X0 Idiopathic chronic gout, multiple sites, without tophus (tophi): Secondary | ICD-10-CM

## 2021-03-02 DIAGNOSIS — E559 Vitamin D deficiency, unspecified: Secondary | ICD-10-CM

## 2021-03-02 DIAGNOSIS — Z8774 Personal history of (corrected) congenital malformations of heart and circulatory system: Secondary | ICD-10-CM

## 2021-03-02 DIAGNOSIS — Z9861 Coronary angioplasty status: Secondary | ICD-10-CM

## 2021-03-02 DIAGNOSIS — D123 Benign neoplasm of transverse colon: Secondary | ICD-10-CM

## 2021-03-02 DIAGNOSIS — I519 Heart disease, unspecified: Secondary | ICD-10-CM

## 2021-03-02 DIAGNOSIS — K2990 Gastroduodenitis, unspecified, without bleeding: Secondary | ICD-10-CM

## 2021-03-02 DIAGNOSIS — K2970 Gastritis, unspecified, without bleeding: Secondary | ICD-10-CM

## 2021-03-02 DIAGNOSIS — J90 Pleural effusion, not elsewhere classified: Secondary | ICD-10-CM
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
## 2021-03-02 NOTE — Patient Instructions (Signed)
Standing Labs We placed an order today for your standing lab work.   Please have your standing labs drawn in April  If possible, please have your labs drawn 2 weeks prior to your appointment so that the provider can discuss your results at your appointment.  Please note that you may see your imaging and lab results in [place] before we have reviewed them. We may be awaiting multiple results to interpret others before contacting you. Please allow our office up to 72 hours to thoroughly review all of the results before contacting the office for clarification of your results.  We have open lab daily: Monday through Thursday from 1:30-4:30 PM and Friday from 1:30-4:00 PM at the office of Dr. Bo Merino, [facility].   Please be advised, all patients with office appointments requiring lab work will take precedent over walk-in lab work.  If possible, please come for your lab work on Monday and Friday afternoons, as you may experience shorter wait times. The office is located at [address], [place], [place], [place] [address] No appointment is necessary.   Labs are drawn by [facility]. Please bring your co-pay at the time of your lab draw.  You may receive a bill from [facility] for your lab work.  If you wish to have your labs drawn at another location, please call the office 24 hours in advance to send orders.  If you have any questions regarding directions or hours of operation,  please call [phone].   As a reminder, please drink plenty of water prior to coming for your lab work. Thanks!   Steps to Quit Smoking Smoking tobacco is the leading cause of preventable death. It can affect almost every organ in the body. Smoking puts you and people around you at risk for many serious, long-lasting (chronic) diseases. Quitting smoking can be hard, but it is one of the best things that you can do for your health. It is never too late to quit. How do I get ready to quit? When you  decide to quit smoking, make a plan to help you succeed. Before you quit: Pick a date to quit. Set a date within the next 2 weeks to give you time to prepare. Write down the reasons why you are quitting. Keep this list in places where you will see it often. Tell your family, friends, and co-workers that you are quitting. Their support is important. Talk with your doctor about the choices that may help you quit. Find out if your health insurance will pay for these treatments. Know the people, places, things, and activities that make you want to smoke (triggers). Avoid them. What first steps can I take to quit smoking? Throw away all cigarettes at home, at work, and in your car. Throw away the things that you use when you smoke, such as ashtrays and lighters. Clean your car. Make sure to empty the ashtray. Clean your home, including curtains and carpets. What can I do to help me quit smoking? Talk with your doctor about taking medicines and seeing a counselor at the same time. You are more likely to succeed when you do both. If you are pregnant or breastfeeding, talk with your doctor about counseling or other ways to quit smoking. Do not take medicine to help you quit smoking unless your doctor tells you to do so. To quit smoking: Quit right away Quit smoking totally, instead of slowly cutting back on how much you smoke over a period of time.  Go to counseling. You are more likely to quit if you go to counseling sessions regularly. Take medicine You may take medicines to help you quit. Some medicines need a prescription, and some you can buy over-the-counter. Some medicines may contain a drug called nicotine to replace the nicotine in cigarettes. Medicines may: Help you to stop having the desire to smoke (cravings). Help to stop the problems that come when you stop smoking (withdrawal symptoms). Your doctor may ask you to use: Nicotine patches, gum, or lozenges. Nicotine inhalers or  sprays. Non-nicotine medicine that is taken by mouth. Find resources Find resources and other ways to help you quit smoking and remain smoke-free after you quit. These resources are most helpful when you use them often. They include: Online chats with a Social worker. Phone quitlines. Printed Furniture conservator/restorer. Support groups or group counseling. Text messaging programs. Mobile phone apps. Use apps on your mobile phone or tablet that can help you stick to your quit plan. There are many free apps for mobile phones and tablets as well as websites. Examples include Quit Guide from the [employer] and smokefree.gov  What things can I do to make it easier to quit?  Talk to your family and friends. Ask them to support and encourage you. Call a phone quitline (1-800-QUIT-NOW), reach out to support groups, or work with a Social worker. Ask people who smoke to not smoke around you. Avoid places that make you want to smoke, such as: Bars. Parties. Smoke-break areas at work. Spend time with people who do not smoke. Lower the stress in your life. Stress can make you want to smoke. Try these things to help your stress: Getting regular exercise. Doing deep-breathing exercises. Doing yoga. Meditating. Doing a body scan. To do this, close your eyes, focus on one area of your body at a time from head to toe. Notice which parts of your body are tense. Try to relax the muscles in those areas. How will I feel when I quit smoking? Day 1 to 3 weeks Within the first 24 hours, you may start to have some problems that come from quitting tobacco. These problems are very bad 2-3 days after you quit, but they do not often last for more than 2-3 weeks. You may get these symptoms: Mood swings. Feeling restless, nervous, angry, or annoyed. Trouble concentrating. Dizziness. Strong desire for high-sugar foods and nicotine. Weight gain. Trouble pooping (constipation). Feeling like you may vomit (nausea). Coughing or a sore  throat. Changes in how the medicines that you take for other issues work in your body. Depression. Trouble sleeping (insomnia). Week 3 and afterward After the first 2-3 weeks of quitting, you may start to notice more positive results, such as: Better sense of smell and taste. Less coughing and sore throat. Slower heart rate. Lower blood pressure. Clearer skin. Better breathing. Fewer sick days. Quitting smoking can be hard. Do not give up if you fail the first time. Some people need to try a few times before they succeed. Do your best to stick to your quit plan, and talk with your doctor if you have any questions or concerns. Summary Smoking tobacco is the leading cause of preventable death. Quitting smoking can be hard, but it is one of the best things that you can do for your health. When you decide to quit smoking, make a plan to help you succeed. Quit smoking right away, not slowly over a period of time. When you start quitting, seek help from your doctor, family,  or friends. This information is not intended to replace advice given to you by your health care provider. Make sure you discuss any questions you have with your health care provider. Document Revised: 12/17/2020 Document Reviewed: 06/29/2018 Elsevier Patient Education  [employer].

## 2021-03-03 ENCOUNTER — Encounter (HOSPITAL_COMMUNITY): Payer: Self-pay

## 2021-03-03 ENCOUNTER — Ambulatory Visit (HOSPITAL_COMMUNITY)
Admission: RE | Admit: 2021-03-03 | Discharge: 2021-03-03 | Disposition: A | Discharge status: Home / Self Care | Payer: Medicare Other | Source: Ambulatory Visit | Attending: Family Medicine | Admitting: Family Medicine

## 2021-03-03 VITALS — BP 112/62 | HR 80 | Wt 112.8 lb

## 2021-03-03 DIAGNOSIS — I509 Heart failure, unspecified: Secondary | ICD-10-CM | POA: Diagnosis not present

## 2021-03-03 DIAGNOSIS — Z7182 Exercise counseling: Secondary | ICD-10-CM | POA: Diagnosis not present

## 2021-03-03 DIAGNOSIS — N1832 Chronic kidney disease, stage 3b: Secondary | ICD-10-CM | POA: Insufficient documentation

## 2021-03-03 DIAGNOSIS — I2781 Cor pulmonale (chronic): Secondary | ICD-10-CM | POA: Insufficient documentation

## 2021-03-03 DIAGNOSIS — J849 Interstitial pulmonary disease, unspecified: Secondary | ICD-10-CM | POA: Diagnosis not present

## 2021-03-03 DIAGNOSIS — F17210 Nicotine dependence, cigarettes, uncomplicated: Secondary | ICD-10-CM | POA: Diagnosis not present

## 2021-03-03 DIAGNOSIS — I081 Rheumatic disorders of both mitral and tricuspid valves: Secondary | ICD-10-CM | POA: Insufficient documentation

## 2021-03-03 DIAGNOSIS — K922 Gastrointestinal hemorrhage, unspecified: Secondary | ICD-10-CM | POA: Insufficient documentation

## 2021-03-03 DIAGNOSIS — I2721 Secondary pulmonary arterial hypertension: Secondary | ICD-10-CM | POA: Insufficient documentation

## 2021-03-03 DIAGNOSIS — I130 Hypertensive heart and chronic kidney disease with heart failure and stage 1 through stage 4 chronic kidney disease, or unspecified chronic kidney disease: Secondary | ICD-10-CM | POA: Diagnosis not present

## 2021-03-03 DIAGNOSIS — Z79899 Other long term (current) drug therapy: Secondary | ICD-10-CM | POA: Diagnosis not present

## 2021-03-03 DIAGNOSIS — Z720 Tobacco use: Secondary | ICD-10-CM

## 2021-03-03 DIAGNOSIS — Z8719 Personal history of other diseases of the digestive system: Secondary | ICD-10-CM | POA: Insufficient documentation

## 2021-03-03 DIAGNOSIS — Z86010 Personal history of colonic polyps: Secondary | ICD-10-CM | POA: Diagnosis not present

## 2021-03-03 DIAGNOSIS — I50810 Right heart failure, unspecified: Secondary | ICD-10-CM

## 2021-03-03 DIAGNOSIS — I3139 Other pericardial effusion (noninflammatory): Secondary | ICD-10-CM | POA: Diagnosis not present

## 2021-03-03 DIAGNOSIS — Z955 Presence of coronary angioplasty implant and graft: Secondary | ICD-10-CM | POA: Diagnosis not present

## 2021-03-03 DIAGNOSIS — J90 Pleural effusion, not elsewhere classified: Secondary | ICD-10-CM | POA: Insufficient documentation

## 2021-03-03 DIAGNOSIS — I2510 Atherosclerotic heart disease of native coronary artery without angina pectoris: Secondary | ICD-10-CM | POA: Insufficient documentation

## 2021-03-03 DIAGNOSIS — I252 Old myocardial infarction: Secondary | ICD-10-CM | POA: Diagnosis not present

## 2021-03-03 DIAGNOSIS — D500 Iron deficiency anemia secondary to blood loss (chronic): Secondary | ICD-10-CM | POA: Insufficient documentation

## 2021-03-03 DIAGNOSIS — M349 Systemic sclerosis, unspecified: Secondary | ICD-10-CM | POA: Diagnosis not present

## 2021-03-03 DIAGNOSIS — I272 Pulmonary hypertension, unspecified: Secondary | ICD-10-CM | POA: Diagnosis not present

## 2021-03-03 NOTE — Patient Instructions (Signed)
No medication changes today!  Your physician recommends that you schedule a follow-up appointment in: 5 months with an ECHO  Please call office at [phone] option 2 if you have any questions or concerns.   At the [facility], you and your health needs are our priority. As part of our continuing mission to provide you with exceptional heart care, we have created designated Provider Care Teams. These Care Teams include your primary Cardiologist (physician) and Advanced Practice Providers (APPs- Physician Assistants and Nurse Practitioners) who all work together to provide you with the care you need, when you need it.   You may see any of the following providers on your designated Care Team at your next follow up: Dr Glori Bickers Dr Haynes Kerns, NP Lyda Jester, [place] [facility], [place] Audry Riles, PharmD   Please be sure to bring in all your medications bottles to every appointment.

## 2021-03-06 ENCOUNTER — Other Ambulatory Visit (HOSPITAL_COMMUNITY): Payer: Self-pay | Admitting: Internal Medicine

## 2021-03-07 MED ORDER — POTASSIUM CHLORIDE CRYS ER 20 MEQ PO TBCR: 20.0000 meq | EXTENDED_RELEASE_TABLET | Freq: Every day | ORAL | 1 refills | Status: DC

## 2021-03-07 NOTE — Addendum Note (Signed)
Addended by: Miaisabella Bacorn, Sharlot Gowda on: 03/07/2021 04:34 PM   Modules accepted: Orders

## 2021-03-15 ENCOUNTER — Other Ambulatory Visit: Payer: Self-pay

## 2021-03-15 ENCOUNTER — Ambulatory Visit (INDEPENDENT_AMBULATORY_CARE_PROVIDER_SITE_OTHER): Payer: 59 | Admitting: Thoracic Surgery (Cardiothoracic Vascular Surgery)

## 2021-03-15 ENCOUNTER — Encounter: Payer: Self-pay | Admitting: Thoracic Surgery (Cardiothoracic Vascular Surgery)

## 2021-03-15 ENCOUNTER — Ambulatory Visit (HOSPITAL_COMMUNITY)
Admission: RE | Admit: 2021-03-15 | Discharge: 2021-03-15 | Disposition: A | Discharge status: Home / Self Care | Payer: Medicare Other | Source: Ambulatory Visit | Attending: Nephrology | Admitting: Nephrology

## 2021-03-15 VITALS — BP 129/79 | HR 92 | Resp 20 | Ht 69.0 in | Wt 114.0 lb

## 2021-03-15 VITALS — BP 141/79 | HR 94 | Temp 97.4°F | Resp 20

## 2021-03-15 DIAGNOSIS — D631 Anemia in chronic kidney disease: Secondary | ICD-10-CM | POA: Diagnosis not present

## 2021-03-15 DIAGNOSIS — I3139 Other pericardial effusion (noninflammatory): Secondary | ICD-10-CM | POA: Diagnosis not present

## 2021-03-15 DIAGNOSIS — I272 Pulmonary hypertension, unspecified: Secondary | ICD-10-CM | POA: Insufficient documentation

## 2021-03-15 DIAGNOSIS — F17200 Nicotine dependence, unspecified, uncomplicated: Secondary | ICD-10-CM | POA: Diagnosis not present

## 2021-03-15 DIAGNOSIS — N184 Chronic kidney disease, stage 4 (severe): Secondary | ICD-10-CM | POA: Diagnosis not present

## 2021-03-15 DIAGNOSIS — I130 Hypertensive heart and chronic kidney disease with heart failure and stage 1 through stage 4 chronic kidney disease, or unspecified chronic kidney disease: Secondary | ICD-10-CM | POA: Insufficient documentation

## 2021-03-15 DIAGNOSIS — M349 Systemic sclerosis, unspecified: Secondary | ICD-10-CM | POA: Diagnosis not present

## 2021-03-15 DIAGNOSIS — D4989 Neoplasm of unspecified behavior of other specified sites: Secondary | ICD-10-CM

## 2021-03-15 DIAGNOSIS — N183 Chronic kidney disease, stage 3 unspecified: Secondary | ICD-10-CM

## 2021-03-15 DIAGNOSIS — I2510 Atherosclerotic heart disease of native coronary artery without angina pectoris: Secondary | ICD-10-CM | POA: Diagnosis not present

## 2021-03-15 DIAGNOSIS — I509 Heart failure, unspecified: Secondary | ICD-10-CM | POA: Diagnosis not present

## 2021-03-15 DIAGNOSIS — E039 Hypothyroidism, unspecified: Secondary | ICD-10-CM | POA: Diagnosis not present

## 2021-03-15 DIAGNOSIS — E785 Hyperlipidemia, unspecified: Secondary | ICD-10-CM | POA: Insufficient documentation

## 2021-03-15 LAB — RENAL FUNCTION PANEL
Albumin: 3.2 g/dL — ABNORMAL LOW (ref 3.5–5.0)
Anion gap: 10 (ref 5–15)
BUN: 62 mg/dL — ABNORMAL HIGH (ref 8–23)
CO2: 21 mmol/L — ABNORMAL LOW (ref 22–32)
Calcium: 8.4 mg/dL — ABNORMAL LOW (ref 8.9–10.3)
Chloride: 108 mmol/L (ref 98–111)
Creatinine, Ser: 3.27 mg/dL — ABNORMAL HIGH (ref 0.61–1.24)
GFR, Estimated: 21 mL/min — ABNORMAL LOW (ref >60)
Glucose, Bld: 87 mg/dL (ref 70–99)
Phosphorus: 4.3 mg/dL (ref 2.5–4.6)
Potassium: 3.9 mmol/L (ref 3.5–5.1)
Sodium: 139 mmol/L (ref 135–145)

## 2021-03-15 LAB — IRON AND TIBC
Iron: 69 ug/dL (ref 45–182)
Saturation Ratios: 27 % (ref 17.9–39.5)
TIBC: 259 ug/dL (ref 250–450)
UIBC: 190 ug/dL

## 2021-03-15 LAB — POCT HEMOGLOBIN-HEMACUE: Hemoglobin: 9.3 g/dL — ABNORMAL LOW (ref 13.0–17.0)

## 2021-03-15 MED ORDER — EPOETIN ALFA-EPBX 10000 UNIT/ML IJ SOLN
INTRAMUSCULAR | Status: AC
  Administered 2021-03-15: 20000 [IU] via SUBCUTANEOUS
  Filled 2021-03-15: qty 2

## 2021-03-15 MED ORDER — EPOETIN ALFA-EPBX 10000 UNIT/ML IJ SOLN: 20000.0000 [IU] | INTRAMUSCULAR | Status: DC

## 2021-03-15 NOTE — Progress Notes (Signed)
Patient ID: [name], male   DOB: 08-18-1960, 60 y.o.   MRN: [id number]      [address]       [place] [address]             [id number]    Mr. Sheils returns to discuss the results of his PET/CT.  Elon Eoff is a 26 year old man with numerous medical problems including scleroderma, pulmonary hypertension, coronary artery disease, heart failure with preserved ejection fraction, stage IV chronic kidney disease, hypertension, hyperlipidemia, hypothyroidism, pericardial effusion, pericardial window, right pleural effusion, and tobacco abuse.  I did a right VATS for drainage of a pleural effusion and pericardial window in November 2020.  He recovered well after the surgery.  He recently saw Dr. Chase Caller regarding interstitial lung disease.  He had a high-resolution CT which showed evidence of ILD consistent with UIP.  There was an anterior mediastinal soft tissue density measuring 1.7 x 1.2 cm.  It was unchanged from his previous CT a year earlier.  I recommended a PET/CT to further evaluate that area.  In the interim since then he was hospitalized with a gastrointestinal bleed.  NUCLEAR MEDICINE PET SKULL BASE TO THIGH   TECHNIQUE: 5.5 mCi F-18 FDG was injected intravenously. Full-ring PET imaging was performed from the skull base to thigh after the radiotracer. CT data was obtained and used for attenuation correction and anatomic localization.   Fasting blood glucose: 88 mg/dl   COMPARISON:  CT 02/20/2021   FINDINGS: Mediastinal blood pool activity: SUV max 2.6   Liver activity: SUV max NA   NECK: No hypermetabolic lymph nodes in the neck.   Incidental CT findings: none   CHEST: Again demonstrated soft tissue thickening in the anterior mediastinum which appears to represent a collection of small nodes. One of the larger nodes has radiotracer activity SUV max equal 4.4 on image 168. This lesion measures 9 mm short axis.   No discrete  hypermetabolic nodes otherwise in the mediastinum.   Incidental CT findings: Moderate to large layering LEFT pleural RIGHT pleural effusion   ABDOMEN/PELVIS: Hypermetabolic activity in the RIGHT lower quadrant localizing to the ascending colon. No CT lesion identified. No hypermetabolic abdominopelvic lymph nodes.   Incidental CT findings: none   SKELETON: No focal hypermetabolic activity to suggest skeletal metastasis.   Incidental CT findings: none   IMPRESSION: 1. Low metabolic activity associated cluster of nodules in the anterior mediastinum. Favor benign tissue. 2. No evidence of mediastinal lymphadenopathy. 3. Moderate RIGHT pleural effusion. 4. Activity localizing to the RIGHT colon without CT correlation is favored benign.     Electronically Signed   By: Suzy Bouchard M.D.   On: 03/01/2021 15:00 I personally reviewed the PET/CT images.  There is some activity in the anterior mediastinal area, but not overly concerning.  Radiology feels this is likely benign.  Differential diagnosis includes lymphadenopathy versus low-grade thymoma.  Given his numerous other medical problems I think the best option for this anterior mediastinal soft tissue density would be continued follow-up.    We will plan to rescan him in 6 months.  Revonda Standard [name], MD [facility] [phone]

## 2021-03-15 NOTE — Telephone Encounter (Signed)
Per lab review -- patient's CKD is not contraindication to Actemra use. Weight-based dosing for Actemra is recommended for RA indication only. There is risk for DVT (3%), HTN (6-7%). There is also risk of GI perforation with increased risk in patients with history of diverticulitis, prednisone use of >7.55m once daily, and older age.  Dosing for scleroderma-ILD is 1673mSQ once weekly His ANC >2000, platelets are >100,000, and ALT/AST is wnl . Recommendation:  - Would recommend dose reduction to 16214mvery other week and repeat labs in 1 month to re-evaluate CMET - he has history of elevated LFTs. Need to monitor every 3 months at minimum. - Needs updated baseline lipid panel and quantiferon TB-gold prior to starting Actemra. Future lab orders placed today - Medication will [facility] have to [facility]. Provider form placed in Dr. RamGolden Popilbox. Will reach out to patient once dose is confirmed by Dr. RamChase Callereview of labs below: CBC    Component Value Date/Time   WBC 10.8 (H) 02/21/2021 0147   RBC 2.52 (L) 02/21/2021 0147   HGB 9.3 (L) 03/15/2021 1006   HGB 10.5 (L) 06/11/2018 0852   HCT 23.8 (L) 02/21/2021 0147   HCT 33.1 (L) 06/11/2018 0852   PLT 149 (L) 02/21/2021 0147   PLT 222 06/11/2018 0852   MCV 94.4 02/21/2021 0147   MCV 93 06/11/2018 0852   MCH 30.6 02/21/2021 0147   MCHC 32.4 02/21/2021 0147   RDW 17.9 (H) 02/21/2021 0147   RDW 14.4 06/11/2018 0852   LYMPHSABS 0.9 02/21/2021 0147   MONOABS 0.6 02/21/2021 0147   EOSABS 0.1 02/21/2021 0147   BASOSABS 0.0 02/21/2021 0147    CMP     Component Value Date/Time   NA 139 03/15/2021 1005   NA 145 (H) 06/11/2018 0852   K 3.9 03/15/2021 1005   CL 108 03/15/2021 1005   CO2 21 (L) 03/15/2021 1005   GLUCOSE 87 03/15/2021 1005   BUN 62 (H) 03/15/2021 1005   BUN 34 (H) 06/11/2018 0852   CREATININE 3.27 (H) 03/15/2021 1005   CREATININE 3.21 (H) 02/02/2021 1109   CALCIUM 8.4 (L)  03/15/2021 1005   CALCIUM 8.2 (L) 02/15/2021 1037   PROT 5.1 (L) 02/21/2021 0147   ALBUMIN 3.2 (L) 03/15/2021 1005   AST 28 02/21/2021 0147   ALT 38 02/21/2021 0147   ALKPHOS 69 02/21/2021 0147   BILITOT 0.5 02/21/2021 0147   GFRNONAA 21 (L) 03/15/2021 1005   GFRNONAA 36 (L) 03/23/2017 1527   GFRAA 31 (L) 01/02/2020 1004   GFRAA 41 (L) 03/23/2017 1527     Baseline Immunosuppressant Therapy Labs     Hepatitis Latest Ref Rng & Units 11/08/2019  Hep B Surface Ag NON REACTIVE NON REACTIVE  Hep B IgM NON REACTIVE NON REACTIVE  Hep C Ab NON REACTIVE NON REACTIVE  Hep C Ab NON-REACTI -  Hep C Ab NON-REACTI -  Hep A IgM NON REACTIVE NON REACTIVE    Lab Results  Component Value Date   HIV Non Reactive 02/20/2021   HIV Non Reactive 09/29/2019   HIV Non Reactive 08/29/2018    Immunoglobulin Electrophoresis Latest Ref Rng & Units 10/31/2018  IgA  47 - 310 mg/dL 316(H)  IgG 600 - 1,640 mg/dL 1,288  IgM 50 - 300 mg/dL 101    Serum Protein Electrophoresis Latest Ref Rng & Units 02/21/2021  Total Protein 6.5 - 8.1 g/dL 5.1(L)    Lipid Panel Lab Results  Component Value Date  TRIG 160 (H) 10/10/2019

## 2021-03-16 NOTE — Telephone Encounter (Signed)
Provider portion of [place] PAP application for Actemra placed in Dr. Golden Pop mailbox to be signed. Patient portion not yet mailed to patient  Knox Saliva, PharmD, MPH, BCPS Clinical Pharmacist (Rheumatology and Pulmonology)

## 2021-03-24 ENCOUNTER — Encounter (HOSPITAL_COMMUNITY): Payer: Self-pay

## 2021-03-24 ENCOUNTER — Ambulatory Visit: Payer: 59 | Attending: Internal Medicine

## 2021-03-24 ENCOUNTER — Other Ambulatory Visit (HOSPITAL_BASED_OUTPATIENT_CLINIC_OR_DEPARTMENT_OTHER): Payer: Self-pay

## 2021-03-24 DIAGNOSIS — Z23 Encounter for immunization: Secondary | ICD-10-CM

## 2021-03-24 MED ORDER — PFIZER COVID-19 VAC BIVALENT 30 MCG/0.3ML IM SUSP
INTRAMUSCULAR | 0 refills | Status: DC
  Filled 2021-03-24: qty 0.3, 1d supply, fill #0

## 2021-03-24 NOTE — Progress Notes (Signed)
   Covid-19 Vaccination Clinic  Name:  [name]    MRN: [id number] DOB: Nov 10, 1960  03/24/2021  Mr. [name] was observed post Covid-19 immunization for 15 minutes without incident. He was provided with Vaccine Information Sheet and instruction to access the V-Safe system.   Mr. [name] was instructed to call 911 with any severe reactions post vaccine: Difficulty breathing  Swelling of face and throat  A fast heartbeat  A bad rash all over body  Dizziness and weakness   Immunizations Administered     Name Date Dose VIS Date Route   Pfizer Covid-19 Vaccine Bivalent Booster 03/24/2021 10:18 AM 0.3 mL 12/22/2020 Intramuscular   Manufacturer: [employer]   Lot: [id number]   [place]: [phone]

## 2021-03-25 NOTE — Telephone Encounter (Signed)
Spoke with patient's wife, [name], who states that patient does not want to move forward with starting Actemra. He feels burdened by current medication regimen and his worsening kidney function has led to conversations about possibly starting dialysis. He would like to hold on starting another medication at this time. I did review with patient's wife, that Actemra dosing has been adjusting for his kidney function, but she states that patient has decided to hold off given many ongoing health concerns.  Pharmacy team will send signed provider form to scan center. Patient form was never sent to patient.  F/u appointment with Dr. Chase Caller not yet scheduled.  Knox Saliva, PharmD, MPH, BCPS Clinical Pharmacist (Rheumatology and Pulmonology)

## 2021-03-31 ENCOUNTER — Ambulatory Visit: Payer: 59 | Admitting: Gastroenterology

## 2021-04-02 ENCOUNTER — Other Ambulatory Visit (HOSPITAL_COMMUNITY): Payer: Self-pay | Admitting: Internal Medicine

## 2021-04-12 ENCOUNTER — Ambulatory Visit (HOSPITAL_COMMUNITY)
Admission: RE | Admit: 2021-04-12 | Discharge: 2021-04-12 | Disposition: A | Discharge status: Home / Self Care | Payer: Medicare Other | Source: Ambulatory Visit | Attending: Nephrology | Admitting: Nephrology

## 2021-04-12 ENCOUNTER — Other Ambulatory Visit: Payer: Self-pay

## 2021-04-12 VITALS — BP 141/83 | HR 86 | Temp 97.5°F | Resp 18

## 2021-04-12 DIAGNOSIS — N183 Chronic kidney disease, stage 3 unspecified: Secondary | ICD-10-CM | POA: Diagnosis not present

## 2021-04-12 DIAGNOSIS — D631 Anemia in chronic kidney disease: Secondary | ICD-10-CM | POA: Diagnosis not present

## 2021-04-12 LAB — RENAL FUNCTION PANEL
Albumin: 3.3 g/dL — ABNORMAL LOW (ref 3.5–5.0)
Anion gap: 7 (ref 5–15)
BUN: 58 mg/dL — ABNORMAL HIGH (ref 8–23)
CO2: 21 mmol/L — ABNORMAL LOW (ref 22–32)
Calcium: 8 mg/dL — ABNORMAL LOW (ref 8.9–10.3)
Chloride: 112 mmol/L — ABNORMAL HIGH (ref 98–111)
Creatinine, Ser: 3.09 mg/dL — ABNORMAL HIGH (ref 0.61–1.24)
GFR, Estimated: 22 mL/min — ABNORMAL LOW (ref >60)
Glucose, Bld: 91 mg/dL (ref 70–99)
Phosphorus: 4.1 mg/dL (ref 2.5–4.6)
Potassium: 4.2 mmol/L (ref 3.5–5.1)
Sodium: 140 mmol/L (ref 135–145)

## 2021-04-12 LAB — IRON AND TIBC
Iron: 63 ug/dL (ref 45–182)
Saturation Ratios: 25 % (ref 17.9–39.5)
TIBC: 255 ug/dL (ref 250–450)
UIBC: 192 ug/dL

## 2021-04-12 LAB — POCT HEMOGLOBIN-HEMACUE: Hemoglobin: 9.7 g/dL — ABNORMAL LOW (ref 13.0–17.0)

## 2021-04-12 MED ORDER — EPOETIN ALFA-EPBX 10000 UNIT/ML IJ SOLN
20000.0000 [IU] | INTRAMUSCULAR | Status: DC
  Administered 2021-04-12: 10:00:00 20000 [IU] via SUBCUTANEOUS

## 2021-04-12 MED ORDER — EPOETIN ALFA-EPBX 10000 UNIT/ML IJ SOLN
INTRAMUSCULAR | Status: AC
  Filled 2021-04-12: qty 2

## 2021-04-27 ENCOUNTER — Other Ambulatory Visit: Payer: Self-pay | Admitting: Gastroenterology

## 2021-05-07 ENCOUNTER — Other Ambulatory Visit: Payer: Self-pay | Admitting: Physician Assistant

## 2021-05-09 NOTE — Telephone Encounter (Signed)
Next Visit: 08/03/2021  Last Visit: 03/02/2021  Last Fill: 02/01/2021  DX: Idiopathic chronic gout of multiple sites without tophus   Current Dose per office note 03/02/2021: allopurinol 100 mg daily for management of gout.   Labs: 02/21/2021 WBC 10.8, RBC 2.52, Hgb 737, Hct 23.8, RDW 17.9, Platelets 149, Neutro Abs 9.2, Chloride 114, CO2 16, BUN 53, Creat. 2.71, GFR 26 Calcium 8.0, Total Protein 5.1, Albumin 2.2  Okay to refill Allopurinol?

## 2021-05-10 ENCOUNTER — Ambulatory Visit (HOSPITAL_COMMUNITY)
Admission: RE | Admit: 2021-05-10 | Discharge: 2021-05-10 | Disposition: A | Discharge status: Home / Self Care | Payer: Medicare Other | Source: Ambulatory Visit | Attending: Nephrology | Admitting: Nephrology

## 2021-05-10 VITALS — BP 145/82 | HR 90 | Temp 98.2°F | Resp 17

## 2021-05-10 DIAGNOSIS — D631 Anemia in chronic kidney disease: Secondary | ICD-10-CM | POA: Diagnosis not present

## 2021-05-10 DIAGNOSIS — N183 Chronic kidney disease, stage 3 unspecified: Secondary | ICD-10-CM | POA: Insufficient documentation

## 2021-05-10 LAB — RENAL FUNCTION PANEL
Albumin: 3.3 g/dL — ABNORMAL LOW (ref 3.5–5.0)
Anion gap: 9 (ref 5–15)
BUN: 68 mg/dL — ABNORMAL HIGH (ref 8–23)
CO2: 16 mmol/L — ABNORMAL LOW (ref 22–32)
Calcium: 7.4 mg/dL — ABNORMAL LOW (ref 8.9–10.3)
Chloride: 113 mmol/L — ABNORMAL HIGH (ref 98–111)
Creatinine, Ser: 4.24 mg/dL — ABNORMAL HIGH (ref 0.61–1.24)
GFR, Estimated: 15 mL/min — ABNORMAL LOW (ref >60)
Glucose, Bld: 84 mg/dL (ref 70–99)
Phosphorus: 3.9 mg/dL (ref 2.5–4.6)
Potassium: 4.1 mmol/L (ref 3.5–5.1)
Sodium: 138 mmol/L (ref 135–145)

## 2021-05-10 LAB — IRON AND TIBC
Iron: 54 ug/dL (ref 45–182)
Saturation Ratios: 20 % (ref 17.9–39.5)
TIBC: 272 ug/dL (ref 250–450)
UIBC: 218 ug/dL

## 2021-05-10 LAB — FERRITIN: Ferritin: 279 ng/mL (ref 24–336)

## 2021-05-10 LAB — POCT HEMOGLOBIN-HEMACUE: Hemoglobin: 9.7 g/dL — ABNORMAL LOW (ref 13.0–17.0)

## 2021-05-10 MED ORDER — EPOETIN ALFA-EPBX 10000 UNIT/ML IJ SOLN: 20000.0000 [IU] | INTRAMUSCULAR | Status: DC

## 2021-05-10 MED ORDER — EPOETIN ALFA-EPBX 10000 UNIT/ML IJ SOLN
INTRAMUSCULAR | Status: AC
  Administered 2021-05-10: 20000 [IU] via SUBCUTANEOUS
  Filled 2021-05-10: qty 2

## 2021-05-11 LAB — PTH, INTACT AND CALCIUM
Calcium, Total (PTH): 7.7 mg/dL — ABNORMAL LOW (ref 8.6–10.2)
PTH: 63 pg/mL (ref 15–65)

## 2021-05-13 ENCOUNTER — Encounter (HOSPITAL_COMMUNITY): Payer: Self-pay

## 2021-05-16 ENCOUNTER — Telehealth: Payer: Self-pay | Admitting: Gastroenterology

## 2021-05-16 ENCOUNTER — Encounter (HOSPITAL_COMMUNITY): Payer: Self-pay | Admitting: Internal Medicine

## 2021-05-16 ENCOUNTER — Other Ambulatory Visit: Payer: Self-pay

## 2021-05-16 ENCOUNTER — Telehealth (HOSPITAL_COMMUNITY): Payer: Self-pay | Admitting: Pharmacist

## 2021-05-16 ENCOUNTER — Inpatient Hospital Stay (HOSPITAL_COMMUNITY)
Admission: EM | Admit: 2021-05-16 | Discharge: 2021-05-23 | DRG: 372 | Disposition: A | Discharge status: Home / Self Care | Payer: Medicare Other | Attending: Internal Medicine | Admitting: Internal Medicine

## 2021-05-16 DIAGNOSIS — E785 Hyperlipidemia, unspecified: Secondary | ICD-10-CM | POA: Diagnosis present

## 2021-05-16 DIAGNOSIS — I252 Old myocardial infarction: Secondary | ICD-10-CM

## 2021-05-16 DIAGNOSIS — E876 Hypokalemia: Secondary | ICD-10-CM | POA: Diagnosis present

## 2021-05-16 DIAGNOSIS — I7300 Raynaud's syndrome without gangrene: Secondary | ICD-10-CM | POA: Diagnosis present

## 2021-05-16 DIAGNOSIS — E861 Hypovolemia: Secondary | ICD-10-CM | POA: Diagnosis present

## 2021-05-16 DIAGNOSIS — Z841 Family history of disorders of kidney and ureter: Secondary | ICD-10-CM

## 2021-05-16 DIAGNOSIS — N179 Acute kidney failure, unspecified: Secondary | ICD-10-CM | POA: Diagnosis present

## 2021-05-16 DIAGNOSIS — F17200 Nicotine dependence, unspecified, uncomplicated: Secondary | ICD-10-CM | POA: Diagnosis present

## 2021-05-16 DIAGNOSIS — I272 Pulmonary hypertension, unspecified: Secondary | ICD-10-CM | POA: Diagnosis present

## 2021-05-16 DIAGNOSIS — E860 Dehydration: Secondary | ICD-10-CM | POA: Diagnosis present

## 2021-05-16 DIAGNOSIS — I130 Hypertensive heart and chronic kidney disease with heart failure and stage 1 through stage 4 chronic kidney disease, or unspecified chronic kidney disease: Secondary | ICD-10-CM | POA: Diagnosis present

## 2021-05-16 DIAGNOSIS — I2510 Atherosclerotic heart disease of native coronary artery without angina pectoris: Secondary | ICD-10-CM | POA: Diagnosis present

## 2021-05-16 DIAGNOSIS — A0472 Enterocolitis due to Clostridium difficile, not specified as recurrent: Secondary | ICD-10-CM | POA: Diagnosis not present

## 2021-05-16 DIAGNOSIS — M109 Gout, unspecified: Secondary | ICD-10-CM | POA: Diagnosis present

## 2021-05-16 DIAGNOSIS — E872 Acidosis, unspecified: Secondary | ICD-10-CM | POA: Diagnosis present

## 2021-05-16 DIAGNOSIS — I5032 Chronic diastolic (congestive) heart failure: Secondary | ICD-10-CM | POA: Diagnosis present

## 2021-05-16 DIAGNOSIS — Z885 Allergy status to narcotic agent status: Secondary | ICD-10-CM

## 2021-05-16 DIAGNOSIS — N184 Chronic kidney disease, stage 4 (severe): Secondary | ICD-10-CM | POA: Diagnosis present

## 2021-05-16 DIAGNOSIS — D72829 Elevated white blood cell count, unspecified: Secondary | ICD-10-CM | POA: Diagnosis present

## 2021-05-16 DIAGNOSIS — E162 Hypoglycemia, unspecified: Secondary | ICD-10-CM | POA: Diagnosis not present

## 2021-05-16 DIAGNOSIS — R197 Diarrhea, unspecified: Secondary | ICD-10-CM | POA: Diagnosis not present

## 2021-05-16 DIAGNOSIS — M349 Systemic sclerosis, unspecified: Secondary | ICD-10-CM | POA: Diagnosis present

## 2021-05-16 DIAGNOSIS — E871 Hypo-osmolality and hyponatremia: Secondary | ICD-10-CM | POA: Diagnosis present

## 2021-05-16 DIAGNOSIS — Z79899 Other long term (current) drug therapy: Secondary | ICD-10-CM

## 2021-05-16 DIAGNOSIS — E039 Hypothyroidism, unspecified: Secondary | ICD-10-CM | POA: Diagnosis present

## 2021-05-16 DIAGNOSIS — F419 Anxiety disorder, unspecified: Secondary | ICD-10-CM | POA: Diagnosis present

## 2021-05-16 DIAGNOSIS — Z20822 Contact with and (suspected) exposure to covid-19: Secondary | ICD-10-CM | POA: Diagnosis present

## 2021-05-16 DIAGNOSIS — R059 Cough, unspecified: Secondary | ICD-10-CM | POA: Diagnosis present

## 2021-05-16 DIAGNOSIS — F17210 Nicotine dependence, cigarettes, uncomplicated: Secondary | ICD-10-CM | POA: Diagnosis present

## 2021-05-16 DIAGNOSIS — I10 Essential (primary) hypertension: Secondary | ICD-10-CM | POA: Diagnosis present

## 2021-05-16 DIAGNOSIS — D631 Anemia in chronic kidney disease: Secondary | ICD-10-CM | POA: Diagnosis present

## 2021-05-16 LAB — CBC
HCT: 31.4 % — ABNORMAL LOW (ref 39.0–52.0)
Hemoglobin: 9.6 g/dL — ABNORMAL LOW (ref 13.0–17.0)
MCH: 29.8 pg (ref 26.0–34.0)
MCHC: 30.6 g/dL (ref 30.0–36.0)
MCV: 97.5 fL (ref 80.0–100.0)
Platelets: 232 10*3/uL (ref 150–400)
RBC: 3.22 MIL/uL — ABNORMAL LOW (ref 4.22–5.81)
RDW: 18.6 % — ABNORMAL HIGH (ref 11.5–15.5)
WBC: 14.8 10*3/uL — ABNORMAL HIGH (ref 4.0–10.5)
nRBC: 0 % (ref 0.0–0.2)

## 2021-05-16 LAB — COMPREHENSIVE METABOLIC PANEL
ALT: 16 U/L (ref 0–44)
AST: 23 U/L (ref 15–41)
Albumin: 2.9 g/dL — ABNORMAL LOW (ref 3.5–5.0)
Alkaline Phosphatase: 82 U/L (ref 38–126)
Anion gap: 19 — ABNORMAL HIGH (ref 5–15)
BUN: 88 mg/dL — ABNORMAL HIGH (ref 8–23)
CO2: 11 mmol/L — ABNORMAL LOW (ref 22–32)
Calcium: 7.5 mg/dL — ABNORMAL LOW (ref 8.9–10.3)
Chloride: 109 mmol/L (ref 98–111)
Creatinine, Ser: 6.17 mg/dL — ABNORMAL HIGH (ref 0.61–1.24)
GFR, Estimated: 10 mL/min — ABNORMAL LOW (ref >60)
Glucose, Bld: 99 mg/dL (ref 70–99)
Potassium: 4 mmol/L (ref 3.5–5.1)
Sodium: 139 mmol/L (ref 135–145)
Total Bilirubin: 0.6 mg/dL (ref 0.3–1.2)
Total Protein: 6.8 g/dL (ref 6.5–8.1)

## 2021-05-16 LAB — LIPASE, BLOOD: Lipase: 23 U/L (ref 11–51)

## 2021-05-16 NOTE — Telephone Encounter (Signed)
Returned wife's call. Pt's wife stated that pt has been having severe diarrhea since last Tuesday and has been unable to eat anything for the past 3 days. Pt's wife is very concerned and wanted him to be seen as soon as possible. Let pt's wife know that pt should be seen in the ER, pt may need fluids. Pt's wife was concerned because of germs in ER and pt's health conditions. Wife stated that last time pt was in ER he had to wait 9 hours and  got pneumonia. Let wife know she could try to take him to the ER at [facility] or the [facility] ER at [facility] and the wait times might not be as long. Wife wanted to know how soon we could see him and let wife know the soonest appt is 1/26. Told wife I could schedule him for that appt with Amy Esterwood if he happens to get discharged from ED and they want him to follow up but that ER may admit him.  Wife verbalized understanding and stated she would take pt to the hospital.

## 2021-05-16 NOTE — ED Triage Notes (Signed)
Pt reports having diarrhea since last Tuesday when he got a "kidney shot." Denies abdominal pain or n/v.

## 2021-05-16 NOTE — Telephone Encounter (Signed)
Advanced Heart Failure Patient Advocate Encounter  Prior Authorization for Opsumit has been approved.    PA# [id number] Effective dates: 05/16/21 through 05/16/22  Audry Riles, PharmD, BCPS, BCCP, CPP Heart Failure Clinic Pharmacist [phone]

## 2021-05-16 NOTE — ED Provider Triage Note (Signed)
Emergency Medicine Provider Triage Evaluation Note  [name] , a 61 y.o. male  was evaluated in triage.  Pt complains of diarrhea for 1 week.  Reports that he gets a "kidney shot" that often gives him some GI upset however he has never had diarrhea for this long.  No recent travel or antibiotics.  Has not had a formed stool throughout this time.  No blood in the diarrhea.  No nausea or vomiting.  Decreased appetite  Review of Systems  Per above  Physical Exam  BP 112/74 (BP Location: Right Arm)    Pulse (!) 108    Temp 98.2 F (36.8 C) (Oral)    Resp 18    SpO2 100%  Gen:   Awake, no distress   Resp:  Normal effort  MSK:   Moves extremities without difficulty  Other:    Medical Decision Making  Medically screening exam initiated at 4:55 PM.  Appropriate orders placed.  Thorsten Climer Stenner was informed that the remainder of the evaluation will be completed by another provider, this initial triage assessment does not replace that evaluation, and the importance of remaining in the ED until their evaluation is complete.     Rhae Hammock, PA-C 05/16/21 1656

## 2021-05-16 NOTE — Telephone Encounter (Signed)
Patient Advocate Encounter   Received notification from [employer] that prior authorization for Opsumit is required.   PA submitted on CoverMyMeds Key [id number] Status is pending   Will continue to follow.  Audry Riles, PharmD, BCPS, BCCP, CPP Heart Failure Clinic Pharmacist [phone]

## 2021-05-16 NOTE — Telephone Encounter (Signed)
Patients wife calling to speak with a nurse is very concerned states he has had diarrhea for a few days now and he is not eating and they are seeking advise. Michela Pitcher " he is going to die if he doesn't get help".

## 2021-05-17 ENCOUNTER — Ambulatory Visit: Payer: 59 | Admitting: Podiatry

## 2021-05-17 ENCOUNTER — Encounter (HOSPITAL_COMMUNITY): Payer: Self-pay | Admitting: Internal Medicine

## 2021-05-17 DIAGNOSIS — E872 Acidosis, unspecified: Secondary | ICD-10-CM | POA: Diagnosis not present

## 2021-05-17 DIAGNOSIS — R197 Diarrhea, unspecified: Secondary | ICD-10-CM | POA: Diagnosis present

## 2021-05-17 LAB — C DIFFICILE QUICK SCREEN W PCR REFLEX
C Diff antigen: POSITIVE — AB
C Diff interpretation: DETECTED
C Diff toxin: POSITIVE — AB

## 2021-05-17 LAB — RESP PANEL BY RT-PCR (FLU A&B, COVID) ARPGX2
Influenza A by PCR: NEGATIVE
Influenza B by PCR: NEGATIVE
SARS Coronavirus 2 by RT PCR: NEGATIVE

## 2021-05-17 LAB — CK: Total CK: 307 U/L (ref 49–397)

## 2021-05-17 LAB — URINALYSIS, ROUTINE W REFLEX MICROSCOPIC
Bilirubin Urine: NEGATIVE
Glucose, UA: NEGATIVE mg/dL
Ketones, ur: NEGATIVE mg/dL
Leukocytes,Ua: NEGATIVE
Nitrite: NEGATIVE
Protein, ur: 30 mg/dL — AB
Specific Gravity, Urine: 1.011 (ref 1.005–1.030)
pH: 5 (ref 5.0–8.0)

## 2021-05-17 LAB — CLOSTRIDIUM DIFFICILE BY PCR, REFLEXED: Toxigenic C. Difficile by PCR: POSITIVE — AB

## 2021-05-17 LAB — LACTIC ACID, PLASMA: Lactic Acid, Venous: 1.8 mmol/L (ref 0.5–1.9)

## 2021-05-17 MED ORDER — MACITENTAN 10 MG PO TABS
10.0000 mg | ORAL_TABLET | Freq: Every day | ORAL | Status: DC
  Administered 2021-05-17 – 2021-05-23 (×7): 10 mg via ORAL
  Filled 2021-05-17 (×7): qty 1

## 2021-05-17 MED ORDER — SODIUM CHLORIDE 0.9% FLUSH
3.0000 mL | Freq: Two times a day (BID) | INTRAVENOUS | Status: DC
  Administered 2021-05-17 – 2021-05-21 (×7): 3 mL via INTRAVENOUS

## 2021-05-17 MED ORDER — ALLOPURINOL 100 MG PO TABS
100.0000 mg | ORAL_TABLET | Freq: Every day | ORAL | Status: DC
  Administered 2021-05-17 – 2021-05-23 (×7): 100 mg via ORAL
  Filled 2021-05-17 (×7): qty 1

## 2021-05-17 MED ORDER — ALPRAZOLAM 0.25 MG PO TABS
0.2500 mg | ORAL_TABLET | Freq: Two times a day (BID) | ORAL | Status: DC | PRN
  Administered 2021-05-18 – 2021-05-22 (×6): 0.25 mg via ORAL
  Filled 2021-05-17 (×6): qty 1

## 2021-05-17 MED ORDER — ONDANSETRON HCL 4 MG/2ML IJ SOLN: 4.0000 mg | Freq: Four times a day (QID) | INTRAMUSCULAR | Status: DC | PRN

## 2021-05-17 MED ORDER — ACETAMINOPHEN 650 MG RE SUPP: 650.0000 mg | Freq: Four times a day (QID) | RECTAL | Status: DC | PRN

## 2021-05-17 MED ORDER — HYDROCODONE-ACETAMINOPHEN 10-325 MG PO TABS: 0.5000 | ORAL_TABLET | Freq: Four times a day (QID) | ORAL | Status: DC | PRN

## 2021-05-17 MED ORDER — FIDAXOMICIN 200 MG PO TABS
200.0000 mg | ORAL_TABLET | Freq: Two times a day (BID) | ORAL | Status: DC
  Administered 2021-05-17 – 2021-05-23 (×12): 200 mg via ORAL
  Filled 2021-05-17 (×13): qty 1

## 2021-05-17 MED ORDER — ACETAMINOPHEN 325 MG PO TABS: 650.0000 mg | ORAL_TABLET | Freq: Four times a day (QID) | ORAL | Status: DC | PRN

## 2021-05-17 MED ORDER — ALBUTEROL SULFATE (2.5 MG/3ML) 0.083% IN NEBU: 2.5000 mg | INHALATION_SOLUTION | RESPIRATORY_TRACT | Status: DC | PRN

## 2021-05-17 MED ORDER — PANTOPRAZOLE SODIUM 40 MG PO TBEC
40.0000 mg | DELAYED_RELEASE_TABLET | Freq: Every day | ORAL | Status: DC
  Administered 2021-05-17 – 2021-05-23 (×7): 40 mg via ORAL
  Filled 2021-05-17 (×7): qty 1

## 2021-05-17 MED ORDER — ONDANSETRON HCL 4 MG PO TABS: 4.0000 mg | ORAL_TABLET | Freq: Four times a day (QID) | ORAL | Status: DC | PRN

## 2021-05-17 MED ORDER — LACTATED RINGERS IV BOLUS
1000.0000 mL | Freq: Once | INTRAVENOUS | Status: AC
Start: 2021-05-17 — End: 2021-05-17
  Administered 2021-05-17: 10:00:00 1000 mL via INTRAVENOUS

## 2021-05-17 MED ORDER — MACITENTAN 10 MG PO TABS: 10.0000 mg | ORAL_TABLET | Freq: Every day | ORAL | Status: DC

## 2021-05-17 MED ORDER — HYDRALAZINE HCL 20 MG/ML IJ SOLN: 5.0000 mg | INTRAMUSCULAR | Status: DC | PRN

## 2021-05-17 MED ORDER — MORPHINE SULFATE (PF) 2 MG/ML IV SOLN: 2.0000 mg | INTRAVENOUS | Status: DC | PRN

## 2021-05-17 MED ORDER — ENOXAPARIN SODIUM 40 MG/0.4ML IJ SOSY: 40.0000 mg | PREFILLED_SYRINGE | INTRAMUSCULAR | Status: DC

## 2021-05-17 MED ORDER — NICOTINE 14 MG/24HR TD PT24
14.0000 mg | MEDICATED_PATCH | Freq: Every day | TRANSDERMAL | Status: DC
  Administered 2021-05-17 – 2021-05-23 (×7): 14 mg via TRANSDERMAL
  Filled 2021-05-17 (×7): qty 1

## 2021-05-17 MED ORDER — LACTATED RINGERS IV SOLN: INTRAVENOUS | Status: DC

## 2021-05-17 MED ORDER — ROSUVASTATIN CALCIUM 20 MG PO TABS
20.0000 mg | ORAL_TABLET | Freq: Every day | ORAL | Status: DC
  Administered 2021-05-17 – 2021-05-18 (×2): 20 mg via ORAL
  Filled 2021-05-17 (×2): qty 1

## 2021-05-17 MED ORDER — METHOCARBAMOL 500 MG PO TABS
500.0000 mg | ORAL_TABLET | Freq: Two times a day (BID) | ORAL | Status: DC | PRN
  Administered 2021-05-21: 500 mg via ORAL
  Filled 2021-05-17: qty 1

## 2021-05-17 NOTE — Progress Notes (Signed)
Pharmacy Consult for Pulmonary Hypertension Treatment   Indication - Continuation of prior to admission medication   Patient is 61 y.o.  with history of PAH on chronic Macitentan (Opsumit) PTA and will be continued while hospitalized.   Continuing this medication order as an inpatient requires that monitoring parameters per REMS requirements must be met.  Chronic therapy is under the supervision of Dr. Haroldine Laws who is enrolled in the REMS program and is being notified of continuation of therapy. A staff message in EPIC has been sent notifying the certified prescriber.  Per patient report has previously been educated on Hepatotoxicity . On admission pregnancy risk has been assessed and no monitoring required. Hepatic function has been evaluated. AST/ALT appropriate to continue medication at this time.  Hepatic Function Latest Ref Rng & Units 05/16/2021 05/10/2021 04/12/2021  Total Protein 6.5 - 8.1 g/dL 6.8 - -  Albumin 3.5 - 5.0 g/dL 2.9(L) 3.3(L) 3.3(L)  AST 15 - 41 U/L 23 - -  ALT 0 - 44 U/L 16 - -  Alk Phosphatase 38 - 126 U/L 82 - -  Total Bilirubin 0.3 - 1.2 mg/dL 0.6 - -  Bilirubin, Direct 0.0 - 0.2 mg/dL - - -    If any question arise or pregnancy is identified during hospitalization, contact for bosentan and macitentan: [phone]; ambrisentan: [phone].  Thank for you allowing [place] to participate in the care of this patient.  Thank you for involving pharmacy in this patient's care.  Elita Quick, PharmD PGY1 Ambulatory Care Pharmacy Resident 05/17/2021 3:02 PM  **Pharmacist phone directory can be found on [email or web address] listed under [place]**

## 2021-05-17 NOTE — ED Notes (Signed)
Secure chat sent to Dr. Lorin Mercy regarding bladder scan 238 mL. Awaiting response.

## 2021-05-17 NOTE — Assessment & Plan Note (Signed)
-  Baseline advanced renal failure, now with diarrhea and dehydration -Rehydrating but if not improving will need nephrology consult -He may need consult for dialysis access placement soon, regardless

## 2021-05-17 NOTE — Assessment & Plan Note (Signed)
-  Likely due to CKD in conjunction with prolonged diarrheal illness -Will hydrate and follow -If not improving with IVF, needs nephrology consult for consideration of HD

## 2021-05-17 NOTE — Assessment & Plan Note (Signed)
-  Encourage cessation.   -This was discussed with the patient and should be reviewed on an ongoing basis.   -Patch ordered at patient request.

## 2021-05-17 NOTE — ED Provider Notes (Signed)
[facility] EMERGENCY DEPARTMENT Provider Note   CSN: [id number] Arrival date & time: 05/16/21  1512     History  Chief Complaint  Patient presents with   Diarrhea    [name] is a 61 y.o. male.   Diarrhea Associated symptoms: no abdominal pain, no arthralgias, no chills, no diaphoresis, no fever, no headaches, no myalgias and no vomiting   Patient presents for persistent diarrhea for the past week.  Medical history is notable for asthma, CKD, CAD, HLD, gout, CHF, HTN, previous GI bleed.  He undergoes monthly EPO shots for his chronic anemia.  He states that he typically gets some short-lived diarrhea following the shots.  1 week ago, he underwent a shot and has had persistent diarrhea since that time.  History is provided by both the patient and his wife.  [name] wife states that he has 20-30 episodes of diarrhea per day.  Patient describes the diarrhea as watery and nonbloody.  His wife noted that it has recently appeared green in color.  Patient has had decreased appetite but has continued to drink fluids.  He has had worsening fatigue over this past week and this progressed to the point where yesterday he was unable to even get out of bed.  Patient denies any associated abdominal pain.  He has not had fevers or chills.  He has not had any vomiting.    Home Medications Prior to Admission medications   Medication Sig Start Date End Date Taking? Authorizing Provider  allopurinol (ZYLOPRIM) 100 MG tablet TAKE 1 TABLET BY MOUTH EVERY DAY Patient taking differently: Take 100 mg by mouth daily. 05/09/21  Yes Deveshwar, Abel Presto, MD  ALPRAZolam Duanne Moron) 0.5 MG tablet Take 0.25 mg by mouth 2 (two) times daily as needed for anxiety.   Yes [provider]  colchicine 0.6 MG tablet Take 0.6 mg by mouth daily as needed (gout attacks).   Yes [provider]  Epoetin Alfa-epbx (RETACRIT IJ) Inject as directed every 30 (thirty) days.   Yes [provider]  guaiFENesin (MUCINEX) 600 MG 12 hr tablet Take 600 mg by mouth 2 (two) times daily as needed for cough.   Yes [provider]  HYDROcodone-acetaminophen (NORCO) 10-325 MG tablet Take 0.5-1 tablets by mouth every 6 (six) hours as needed for moderate pain.   Yes [provider]  KLOR-CON M20 20 MEQ tablet TAKE 1 TABLET BY MOUTH TWICE A DAY Patient taking differently: Take 20 mEq by mouth daily. 04/04/21  Yes Bensimhon, Shaune Pascal, MD  macitentan (OPSUMIT) 10 MG tablet Take 1 tablet (10 mg total) by mouth daily. 07/19/20  Yes Bensimhon, Shaune Pascal, MD  methocarbamol (ROBAXIN) 500 MG tablet Take 1 tablet (500 mg total) by mouth 2 (two) times daily as needed for muscle spasms. 10/01/20  Yes Ofilia Neas, PA-C  multivitamin-iron-minerals-folic acid (CENTRUM) chewable tablet Chew 1 tablet by mouth daily.   Yes [provider]  pantoprazole (PROTONIX) 40 MG tablet Take 1 tablet (40 mg total) by mouth daily for 90 doses. 04/27/21 07/26/21 Yes Cirigliano, Vito V, DO  rosuvastatin (CRESTOR) 20 MG tablet Take 20 mg by mouth daily. 09/03/20  Yes [provider]  sildenafil (REVATIO) 20 MG tablet Take 2 tablets (40 mg total) by mouth 3 (three) times daily. 01/03/21 01/03/22 Yes Duke, Tami Lin, PA  torsemide (DEMADEX) 20 MG tablet Take 20 mg by mouth daily.   Yes [provider]  COVID-19 mRNA bivalent vaccine, [place], ([place]  COVID-19 VAC BIVALENT) injection Inject into the muscle. 03/24/21   Carlyle Basques, MD      Allergies    Oxycodone    Review of Systems   Review of Systems  Constitutional:  Positive for activity change, appetite change and fatigue. Negative for chills, diaphoresis and fever.  HENT:  Negative for congestion, ear pain and sore throat.   Eyes:  Negative for pain and visual disturbance.  Respiratory:  Positive for chest tightness. Negative for cough, shortness of breath and wheezing.   Cardiovascular:  Negative for chest pain,  palpitations and leg swelling.  Gastrointestinal:  Positive for diarrhea. Negative for abdominal distention, abdominal pain, blood in stool, nausea and vomiting.  Genitourinary:  Positive for decreased urine volume. Negative for dysuria, flank pain and hematuria.  Musculoskeletal:  Negative for arthralgias, back pain, myalgias and neck pain.  Skin:  Negative for color change and rash.  Neurological:  Positive for weakness (Generalized). Negative for dizziness, seizures, syncope, light-headedness, numbness and headaches.  Psychiatric/Behavioral:  Negative for confusion and decreased concentration.   All other systems reviewed and are negative.  Physical Exam Updated Vital Signs BP (!) 150/84    Pulse 99    Temp 98.5 F (36.9 C) (Oral)    Resp (!) 21    SpO2 100%  Physical Exam Vitals and nursing note reviewed.  Constitutional:      General: He is not in acute distress.    Appearance: He is well-developed and underweight. He is ill-appearing (Chronically).  HENT:     Head: Normocephalic and atraumatic.     Right Ear: External ear normal.     Left Ear: External ear normal.     Nose: Nose normal.     Mouth/Throat:     Mouth: Mucous membranes are moist.     Pharynx: Oropharynx is clear.  Eyes:     General: No scleral icterus.    Extraocular Movements: Extraocular movements intact.     Conjunctiva/sclera: Conjunctivae normal.  Cardiovascular:     Rate and Rhythm: Normal rate and regular rhythm.     Heart sounds: No murmur heard. Pulmonary:     Effort: Pulmonary effort is normal. No respiratory distress.     Breath sounds: Normal breath sounds. No wheezing or rales.  Abdominal:     General: Abdomen is flat.     Palpations: Abdomen is soft.     Tenderness: There is no abdominal tenderness.  Musculoskeletal:        General: No swelling. Normal range of motion.     Cervical back: Normal range of motion and neck supple. No rigidity or tenderness.     Right lower leg: No edema.      Left lower leg: No edema.  Skin:    General: Skin is warm and dry.     Capillary Refill: Capillary refill takes less than 2 seconds.     Coloration: Skin is not jaundiced or pale.  Neurological:     General: No focal deficit present.     Mental Status: He is alert and oriented to person, place, and time.     Cranial Nerves: No cranial nerve deficit.     Sensory: No sensory deficit.     Motor: No weakness.     Coordination: Coordination normal.  Psychiatric:        Mood and Affect: Mood normal.        Behavior: Behavior normal.        Thought Content: Thought content normal.  Judgment: Judgment normal.    ED Results / Procedures / Treatments   Labs (all labs ordered are listed, but only abnormal results are displayed) Labs Reviewed  COMPREHENSIVE METABOLIC PANEL - Abnormal; Notable for the following components:      Result Value   CO2 11 (*)    BUN 88 (*)    Creatinine, Ser 6.17 (*)    Calcium 7.5 (*)    Albumin 2.9 (*)    GFR, Estimated 10 (*)    Anion gap 19 (*)    All other components within normal limits  CBC - Abnormal; Notable for the following components:   WBC 14.8 (*)    RBC 3.22 (*)    Hemoglobin 9.6 (*)    HCT 31.4 (*)    RDW 18.6 (*)    All other components within normal limits  C DIFFICILE QUICK SCREEN W PCR REFLEX    GASTROINTESTINAL PANEL BY PCR, STOOL (REPLACES STOOL CULTURE)  LIPASE, BLOOD  CK  URINALYSIS, ROUTINE W REFLEX MICROSCOPIC    EKG EKG Interpretation  Date/Time:  Tuesday May 17 2021 09:26:20 EST Ventricular Rate:  95 PR Interval:  150 QRS Duration: 94 QT Interval:  385 QTC Calculation: 484 R Axis:   47 Text Interpretation: Sinus rhythm Probable left atrial enlargement Anterior infarct, old Confirmed by Godfrey Pick [id number]) on 05/17/2021 9:36:56 AM  Radiology No results found.  Procedures Procedures    Medications Ordered in ED Medications  lactated ringers bolus 1,000 mL (1,000 mLs Intravenous New Bag/Given 05/17/21  0623)    ED Course/ Medical Decision Making/ A&P                           Medical Decision Making Amount and/or Complexity of Data Reviewed Labs: ordered.  Risk Decision regarding hospitalization.   This patient presents to the ED for concern of diarrhea and fatigue, this involves an extensive number of treatment options, and is a complaint that carries with it a high risk of complications and morbidity.  The differential diagnosis includes bacterial enteritis, viral enteritis, malabsorption, diverticulitis   Co morbidities that complicate the patient evaluation  Scleroderma, asthma, CKD, CAD, HLD, gout, CHF, HTN, previous GI bleed.    Additional history obtained:  Additional history obtained from patient's wife External records from outside source obtained and reviewed including EMR   Lab Tests:  I Ordered, and personally interpreted labs.  The pertinent results include: AKI, metabolic acidosis, leukocytosis, baseline anemia   Imaging Studies ordered:  I ordered imaging studies including N/A  I independently visualized and interpreted imaging which showed N/A   Cardiac Monitoring:  The patient was maintained on a cardiac monitor.  I personally viewed and interpreted the cardiac monitored which showed an underlying rhythm of: Sinus rhythm   Medicines ordered and prescription drug management:  I ordered medication including IV fluids for dehydration and AKI Reevaluation of the patient after these medicines showed that the patient improved I have reviewed the patients home medicines and have made adjustments as needed   Critical Interventions:  IV fluids for dehydration, AKI, and metabolic acidosis.  Admission to hospital.   Problem List / ED Course:  61 year old male with history of scleroderma, asthma, CKD, CAD, HLD, gout, CHF, HTN, previous GI bleed, presenting for persistent diarrhea over the past week.  This has been described as severe, with 20-30  episodes per day.  Although patient has been trying to take in plenty of fluids, he  has had worsening fatigue and labs today show AKI and metabolic acidosis.  Elevated BUN is consistent with prerenal etiology.  Patient has not had any recent pain.  He does not take any nephrotoxic drugs due to his chronic CKD.  At baseline, patient's creatinine is in the range of 3-4.  Today it is 6.17.  IV fluids were given.  Patient also has a leukocytosis, which could be stress demargination.  He has not had any recent fevers or chills.  Stool studies were sent for assessment of bacterial cause of his diarrhea.  Decision for antibiotics will be deferred to inpatient team.  Given his acute on chronic kidney injury and acidosis, patient to be admitted.   Reevaluation:  After the interventions noted above, I reevaluated the patient and found that they have :improved   Social Determinants of Health:  Patient has multiple chronic illnesses.  He does have family support at home through his wife and he does have access to outpatient medical care.   Dispostion:  After consideration of the diagnostic results and the patients response to treatment, I feel that the patent would benefit from admission to the hospital.          Final Clinical Impression(s) / ED Diagnoses Final diagnoses:  AKI (acute kidney injury) ([place])  Diarrhea, unspecified type  Dehydration  Metabolic acidosis    Rx / DC Orders ED Discharge Orders     None         Godfrey Pick, MD 05/18/21 1830

## 2021-05-17 NOTE — Telephone Encounter (Signed)
Spoke with wife to see if she wanted me to cancel appt on 1/26 and wife stated she was unsure at this time and would call us back once pt is seen by doctor. Wife also stated she wanted Dr. Bryan Lemma to know that pt was in the ED. Told wife I would send message to doctor.

## 2021-05-17 NOTE — Telephone Encounter (Signed)
Agree with decision to go to ER for expedited evaluation, particularly with his comorbidities. Will have labs checked there, including GI PCR panel, C diff, CMP, CBC. To keep f/u appt on 1/26 as scheduled, unless w/u requires hospital admission. Thanks for letting me know about Bray.

## 2021-05-17 NOTE — Assessment & Plan Note (Signed)
-  Continue Crestor
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
## 2021-05-17 NOTE — Assessment & Plan Note (Signed)
-  He does not appear to be taking medications for this issue at this time -Will add prn IV hydralazine

## 2021-05-17 NOTE — ED Notes (Signed)
Dinner tray delivered to pt at this time.

## 2021-05-17 NOTE — Assessment & Plan Note (Signed)
-  Patient with 1 week of diarrhea -GI pathogen panel and C diff are pending -Appears to be somewhat better but will follow -Clear liquids, advance diet as tolerated -Will observe for now on telemetry

## 2021-05-17 NOTE — ED Notes (Signed)
Lab called, c-diff positive

## 2021-05-17 NOTE — Progress Notes (Addendum)
Pharmacy Antibiotic Note  [name] is a 61 y.o. male admitted on 05/16/2021 with diarrhea.  No recent antibiotic use noted on chart or reported by patient. Pharmacy has been consulted for C. Difficile treatment.   C. Diff antigen positive, C. Diff toxin positive, C. Diff by PCR positive. WBC elevated at 14.8, patient currently afebrile.   Plan: Start fidaxomicin 200 mg PO BID x 10 days Monitor for signs of clinical improvement     Temp (24hrs), Avg:98.1 F (36.7 C), Min:98.1 F (36.7 C), Max:98.1 F (36.7 C)  Recent Labs  Lab 05/16/21 1641 05/17/21 1200  WBC 14.8*  --   CREATININE 6.17*  --   LATICACIDVEN  --  1.8    CrCl cannot be calculated (Unknown ideal weight.).    Allergies  Allergen Reactions   Oxycodone Other (See Comments)    Hallucinations    Antimicrobials this admission: 1/24 fidaxomicin >>    Thank you for involving pharmacy in this patient's care.  Elita Quick, PharmD PGY1 Ambulatory Care Pharmacy Resident 05/17/2021 10:55 PM  **Pharmacist phone directory can be found on [email or web address] listed under [place]**

## 2021-05-17 NOTE — Assessment & Plan Note (Addendum)
-  Continue macitentan -Likely related to scleroderma

## 2021-05-17 NOTE — ED Notes (Signed)
Bladder Scan showed 238 ml.

## 2021-05-18 ENCOUNTER — Inpatient Hospital Stay (HOSPITAL_COMMUNITY): Payer: Medicare Other

## 2021-05-18 ENCOUNTER — Observation Stay (HOSPITAL_COMMUNITY): Payer: Medicare Other

## 2021-05-18 ENCOUNTER — Other Ambulatory Visit (HOSPITAL_COMMUNITY): Payer: Self-pay

## 2021-05-18 DIAGNOSIS — I130 Hypertensive heart and chronic kidney disease with heart failure and stage 1 through stage 4 chronic kidney disease, or unspecified chronic kidney disease: Secondary | ICD-10-CM | POA: Diagnosis present

## 2021-05-18 DIAGNOSIS — I5032 Chronic diastolic (congestive) heart failure: Secondary | ICD-10-CM | POA: Diagnosis present

## 2021-05-18 DIAGNOSIS — D631 Anemia in chronic kidney disease: Secondary | ICD-10-CM | POA: Diagnosis present

## 2021-05-18 DIAGNOSIS — I2510 Atherosclerotic heart disease of native coronary artery without angina pectoris: Secondary | ICD-10-CM | POA: Diagnosis present

## 2021-05-18 DIAGNOSIS — I7300 Raynaud's syndrome without gangrene: Secondary | ICD-10-CM | POA: Diagnosis present

## 2021-05-18 DIAGNOSIS — F17210 Nicotine dependence, cigarettes, uncomplicated: Secondary | ICD-10-CM | POA: Diagnosis present

## 2021-05-18 DIAGNOSIS — E785 Hyperlipidemia, unspecified: Secondary | ICD-10-CM | POA: Diagnosis present

## 2021-05-18 DIAGNOSIS — N184 Chronic kidney disease, stage 4 (severe): Secondary | ICD-10-CM | POA: Diagnosis present

## 2021-05-18 DIAGNOSIS — A0472 Enterocolitis due to Clostridium difficile, not specified as recurrent: Secondary | ICD-10-CM | POA: Diagnosis present

## 2021-05-18 DIAGNOSIS — I252 Old myocardial infarction: Secondary | ICD-10-CM | POA: Diagnosis not present

## 2021-05-18 DIAGNOSIS — E872 Acidosis, unspecified: Secondary | ICD-10-CM | POA: Diagnosis present

## 2021-05-18 DIAGNOSIS — I272 Pulmonary hypertension, unspecified: Secondary | ICD-10-CM | POA: Diagnosis present

## 2021-05-18 DIAGNOSIS — E860 Dehydration: Secondary | ICD-10-CM | POA: Diagnosis present

## 2021-05-18 DIAGNOSIS — E876 Hypokalemia: Secondary | ICD-10-CM | POA: Diagnosis present

## 2021-05-18 DIAGNOSIS — N179 Acute kidney failure, unspecified: Secondary | ICD-10-CM | POA: Diagnosis present

## 2021-05-18 DIAGNOSIS — Z841 Family history of disorders of kidney and ureter: Secondary | ICD-10-CM | POA: Diagnosis not present

## 2021-05-18 DIAGNOSIS — M349 Systemic sclerosis, unspecified: Secondary | ICD-10-CM | POA: Diagnosis present

## 2021-05-18 DIAGNOSIS — D72829 Elevated white blood cell count, unspecified: Secondary | ICD-10-CM | POA: Diagnosis present

## 2021-05-18 DIAGNOSIS — R197 Diarrhea, unspecified: Secondary | ICD-10-CM | POA: Diagnosis present

## 2021-05-18 DIAGNOSIS — Z20822 Contact with and (suspected) exposure to covid-19: Secondary | ICD-10-CM | POA: Diagnosis present

## 2021-05-18 DIAGNOSIS — E861 Hypovolemia: Secondary | ICD-10-CM | POA: Diagnosis present

## 2021-05-18 DIAGNOSIS — E871 Hypo-osmolality and hyponatremia: Secondary | ICD-10-CM | POA: Diagnosis present

## 2021-05-18 DIAGNOSIS — E162 Hypoglycemia, unspecified: Secondary | ICD-10-CM | POA: Diagnosis not present

## 2021-05-18 DIAGNOSIS — E039 Hypothyroidism, unspecified: Secondary | ICD-10-CM | POA: Diagnosis present

## 2021-05-18 LAB — URINALYSIS, ROUTINE W REFLEX MICROSCOPIC
Bilirubin Urine: NEGATIVE
Glucose, UA: NEGATIVE mg/dL
Ketones, ur: 5 mg/dL — AB
Leukocytes,Ua: NEGATIVE
Nitrite: NEGATIVE
Protein, ur: 30 mg/dL — AB
Specific Gravity, Urine: 1.01 (ref 1.005–1.030)
pH: 5 (ref 5.0–8.0)

## 2021-05-18 LAB — GASTROINTESTINAL PANEL BY PCR, STOOL (REPLACES STOOL CULTURE)

## 2021-05-18 LAB — BASIC METABOLIC PANEL
Anion gap: 13 (ref 5–15)
Anion gap: 16 — ABNORMAL HIGH (ref 5–15)
BUN: 102 mg/dL — ABNORMAL HIGH (ref 8–23)
BUN: 91 mg/dL — ABNORMAL HIGH (ref 8–23)
CO2: 9 mmol/L — ABNORMAL LOW (ref 22–32)
CO2: 9 mmol/L — ABNORMAL LOW (ref 22–32)
Calcium: 6.4 mg/dL — CL (ref 8.9–10.3)
Calcium: 6.3 mg/dL — CL (ref 8.9–10.3)
Chloride: 110 mmol/L (ref 98–111)
Chloride: 113 mmol/L — ABNORMAL HIGH (ref 98–111)
Creatinine, Ser: 6.55 mg/dL — ABNORMAL HIGH (ref 0.61–1.24)
Creatinine, Ser: 5.5 mg/dL — ABNORMAL HIGH (ref 0.61–1.24)
GFR, Estimated: 9 mL/min — ABNORMAL LOW (ref >60)
GFR, Estimated: 11 mL/min — ABNORMAL LOW (ref >60)
Glucose, Bld: 80 mg/dL (ref 70–99)
Glucose, Bld: 68 mg/dL — ABNORMAL LOW (ref 70–99)
Potassium: 3.3 mmol/L — ABNORMAL LOW (ref 3.5–5.1)
Potassium: 3.3 mmol/L — ABNORMAL LOW (ref 3.5–5.1)
Sodium: 132 mmol/L — ABNORMAL LOW (ref 135–145)
Sodium: 138 mmol/L (ref 135–145)

## 2021-05-18 LAB — HEPATIC FUNCTION PANEL
ALT: 19 U/L (ref 0–44)
AST: 28 U/L (ref 15–41)
Albumin: 2.4 g/dL — ABNORMAL LOW (ref 3.5–5.0)
Alkaline Phosphatase: 73 U/L (ref 38–126)
Bilirubin, Direct: 0.2 mg/dL (ref 0.0–0.2)
Indirect Bilirubin: 0.3 mg/dL (ref 0.3–0.9)
Total Bilirubin: 0.5 mg/dL (ref 0.3–1.2)
Total Protein: 5.9 g/dL — ABNORMAL LOW (ref 6.5–8.1)

## 2021-05-18 LAB — CBC
HCT: 25.8 % — ABNORMAL LOW (ref 39.0–52.0)
Hemoglobin: 8.3 g/dL — ABNORMAL LOW (ref 13.0–17.0)
MCH: 30 pg (ref 26.0–34.0)
MCHC: 32.2 g/dL (ref 30.0–36.0)
MCV: 93.1 fL (ref 80.0–100.0)
Platelets: 196 10*3/uL (ref 150–400)
RBC: 2.77 MIL/uL — ABNORMAL LOW (ref 4.22–5.81)
RDW: 18.1 % — ABNORMAL HIGH (ref 11.5–15.5)
WBC: 13.3 10*3/uL — ABNORMAL HIGH (ref 4.0–10.5)
nRBC: 0 % (ref 0.0–0.2)

## 2021-05-18 IMAGING — US US RENAL
1 series · 14 of 25 positions shown · non-contrast
Comparison: Prior renal ultrasound on [DATE]

CLINICAL DATA: Acute kidney injury.

EXAM:
RENAL / URINARY TRACT ULTRASOUND COMPLETE

[Series 1: us renal · 14 of 46 slices shown]
[im 1/46]
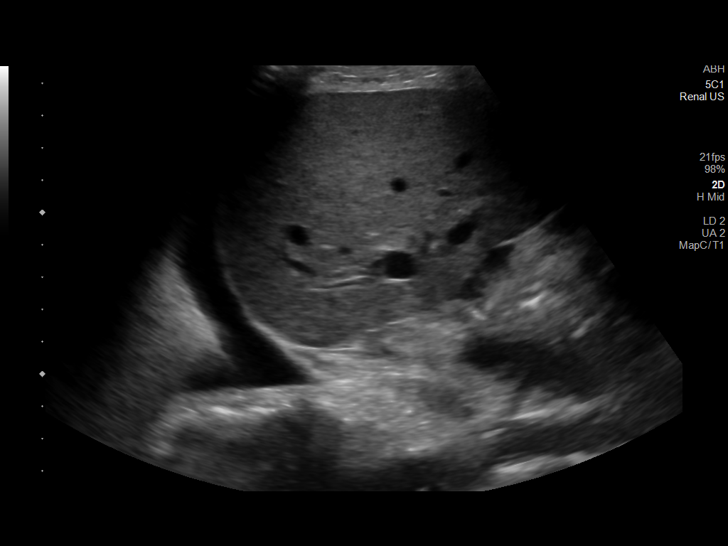
[im 4/46]
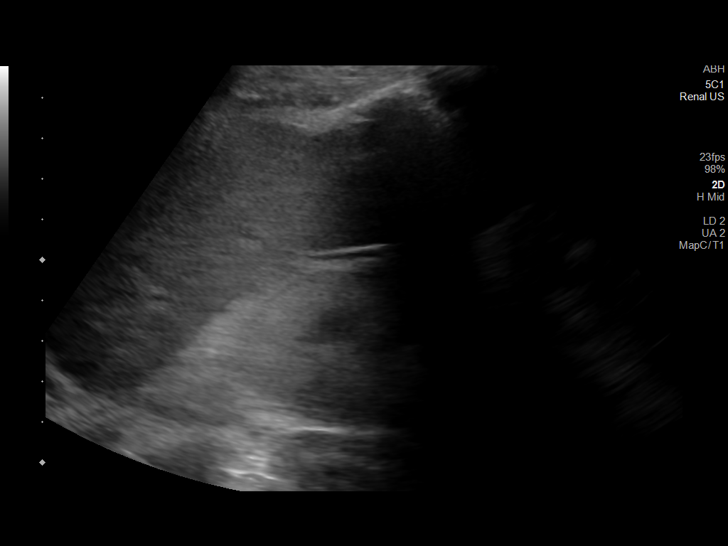
[im 8/46]
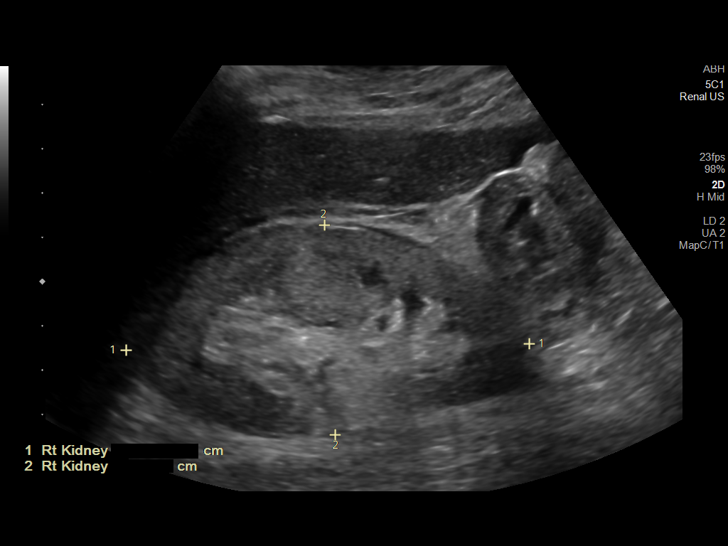
[im 12/46]
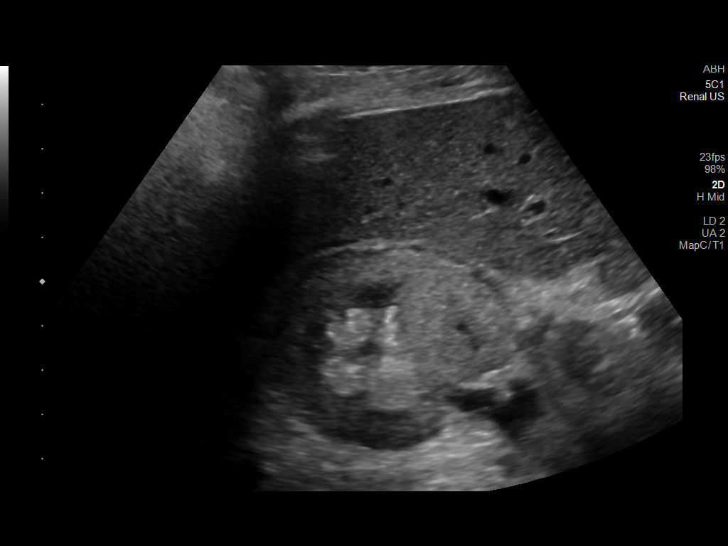
[im 16/46]
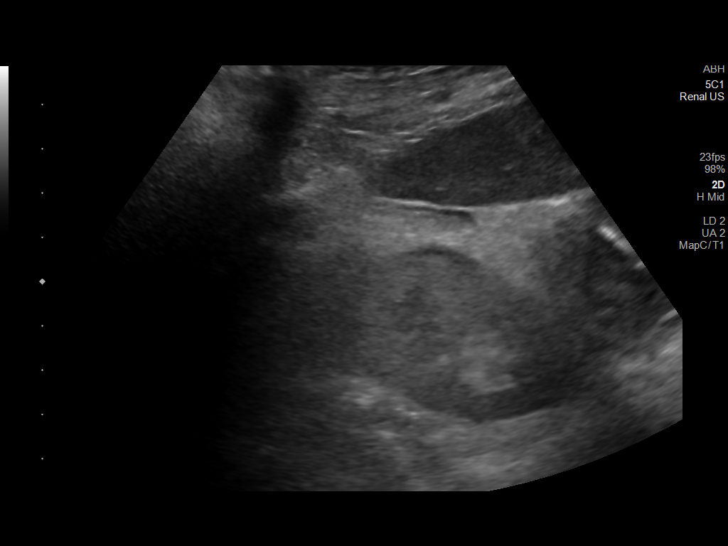
[im 17/46]
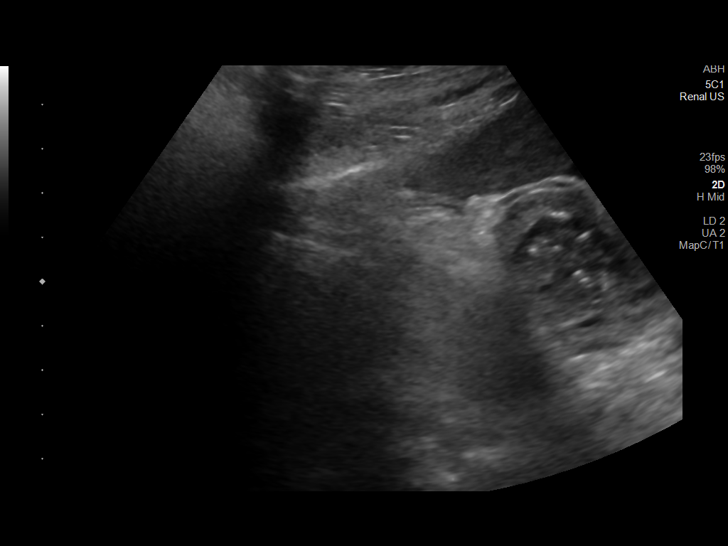
[im 21/46]
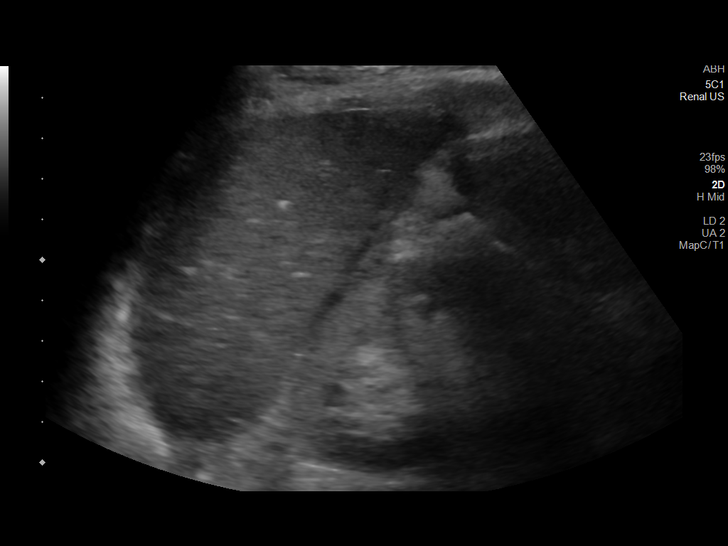
[im 25/46]
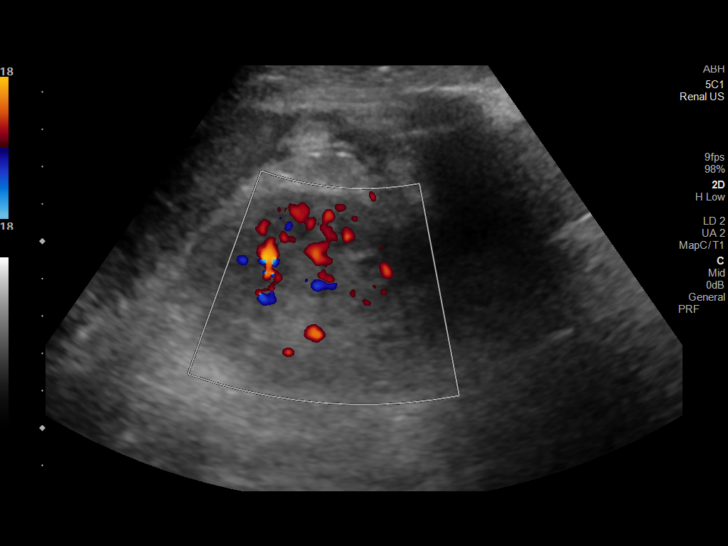
[im 29/46]
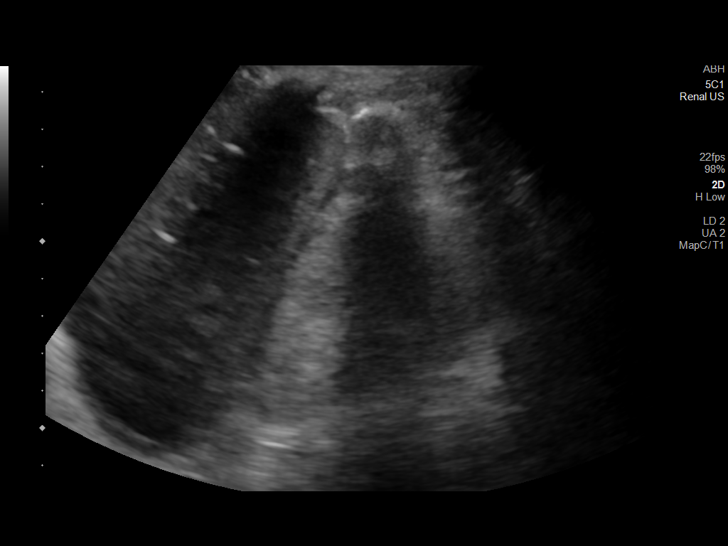
[im 31/46]
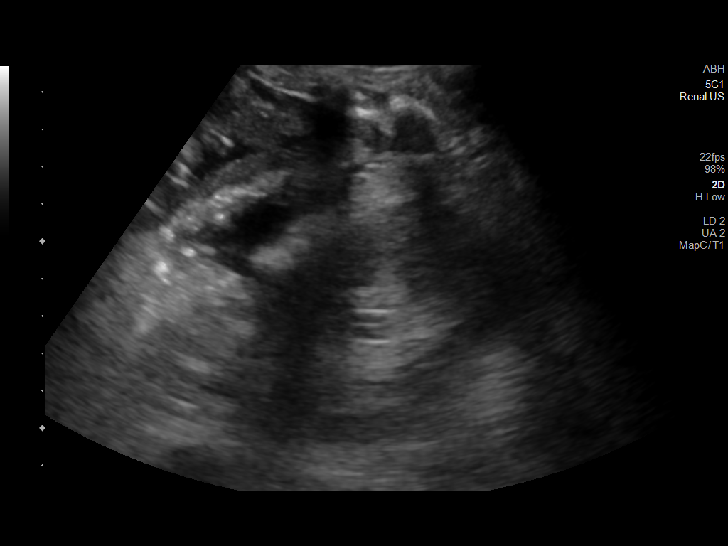
[im 34/46]
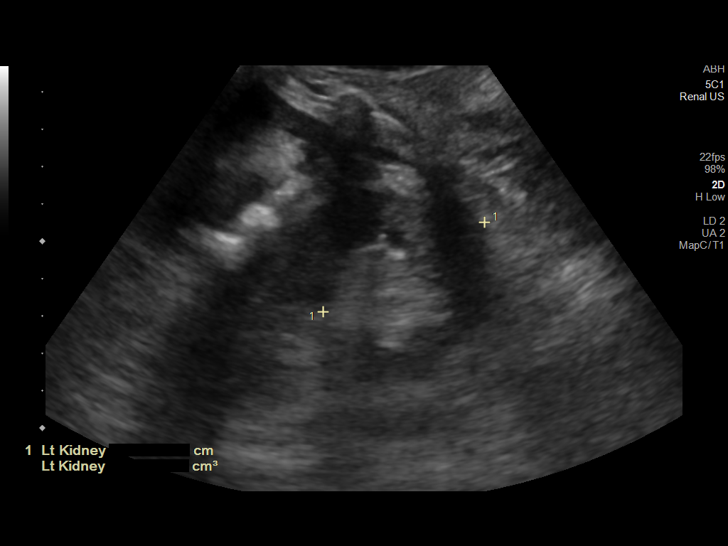
[im 38/46]
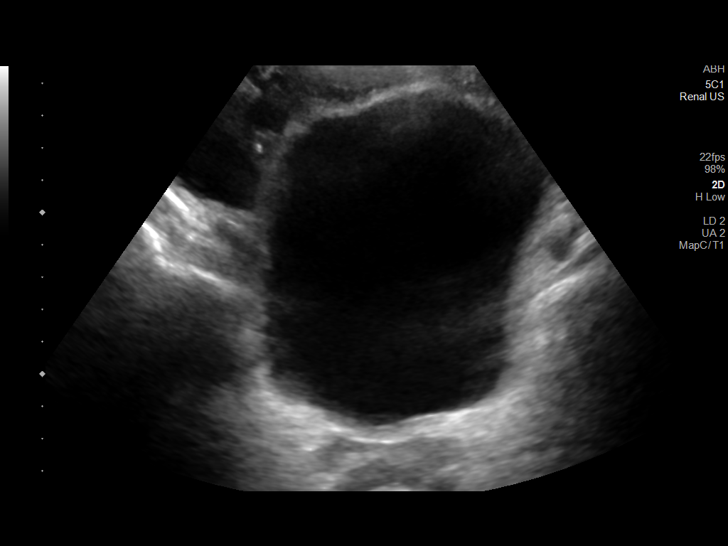
[im 42/46]
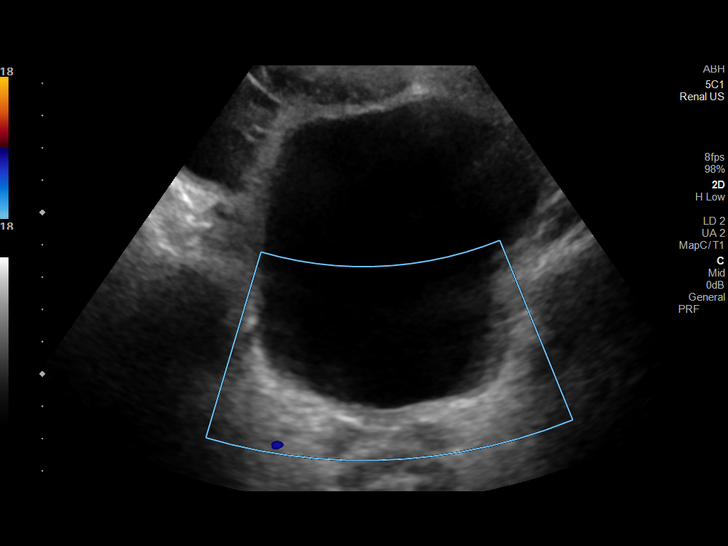
[im 46/46]
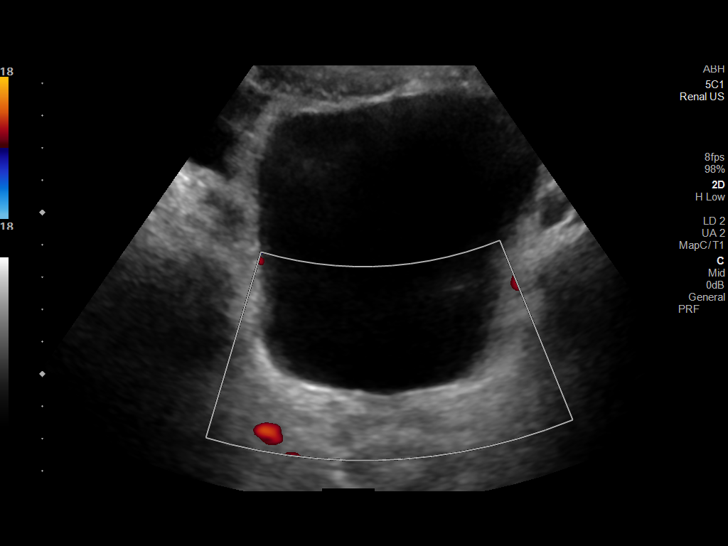

[14 of 25 positions shown; findings below may reference images not displayed]

FINDINGS: Right Kidney:

Renal measurements: 9.1 x 4.7 x 4.6 cm = volume: 103 mL. Echogenic
cortex without significant atrophy. No evidence of hydronephrosis,
focal mass or shadowing calculus.

Left Kidney:

Renal measurements: 9.2 x 5.6 x 4.9 cm = volume: 133 mL. Increased
echogenicity of the renal cortex. No hydronephrosis, focal mass or
shadowing calculus.

Bladder:

Appears normal for degree of bladder distention.

Other:

None.
IMPRESSION: Both kidneys demonstrate increased echogenicity consistent with
chronic kidney disease. No evidence of hydronephrosis bilaterally.

## 2021-05-18 MED ORDER — SODIUM CHLORIDE 0.45 % IV SOLN
INTRAVENOUS | Status: DC
  Filled 2021-05-18 (×3): qty 75

## 2021-05-18 MED ORDER — ENOXAPARIN SODIUM 30 MG/0.3ML IJ SOSY
30.0000 mg | PREFILLED_SYRINGE | INTRAMUSCULAR | Status: DC
  Administered 2021-05-18 – 2021-05-22 (×5): 30 mg via SUBCUTANEOUS
  Filled 2021-05-18 (×5): qty 0.3

## 2021-05-18 MED ORDER — CALCIUM CARBONATE 1250 (500 CA) MG PO TABS
1.0000 | ORAL_TABLET | Freq: Once | ORAL | Status: AC
  Administered 2021-05-18: 500 mg via ORAL
  Filled 2021-05-18: qty 1

## 2021-05-18 MED ORDER — POTASSIUM CHLORIDE CRYS ER 20 MEQ PO TBCR
20.0000 meq | EXTENDED_RELEASE_TABLET | Freq: Once | ORAL | Status: AC
  Administered 2021-05-18: 20 meq via ORAL
  Filled 2021-05-18: qty 1

## 2021-05-18 MED ORDER — CALCIUM CARBONATE 1250 (500 CA) MG PO TABS
1.0000 | ORAL_TABLET | Freq: Every day | ORAL | Status: DC
  Administered 2021-05-18: 500 mg via ORAL
  Filled 2021-05-18 (×2): qty 1

## 2021-05-18 MED ORDER — SODIUM CHLORIDE 0.9 % IV BOLUS
500.0000 mL | Freq: Once | INTRAVENOUS | Status: AC
Start: 2021-05-18 — End: 2021-05-18
  Administered 2021-05-18: 500 mL via INTRAVENOUS

## 2021-05-18 NOTE — Plan of Care (Signed)
Problem: Clinical Measurements: Goal: Diagnostic test results will improve Outcome: Progressing   

## 2021-05-18 NOTE — Progress Notes (Incomplete)
°   05/17/21 2349  Assess: MEWS Score  Temp 99.1 F (37.3 C)  BP (!) 141/83  Pulse Rate (!) 101  Resp (!) 22  Level of Consciousness Alert  SpO2 98 %  O2 Device Room Air  Assess: MEWS Score  MEWS Temp 0  MEWS Systolic 0  MEWS Pulse 1  MEWS RR 1  MEWS LOC 0  MEWS Score 2  MEWS Score Color Yellow  Assess: if the MEWS score is Yellow or Red  Were vital signs taken at a resting state? Yes  Focused Assessment Change from prior assessment (see assessment flowsheet)  Early Detection of Sepsis Score *See Row Information* Low  MEWS guidelines implemented *See Row Information* Yes  Treat  MEWS Interventions Escalated (See documentation below)  Pain Scale 0-10  Pain Score 0  Take Vital Signs  Increase Vital Sign Frequency  Yellow: Q 2hr X 2 then Q 4hr X 2, if remains yellow, continue Q 4hrs  Escalate  MEWS: Escalate Yellow: discuss with charge nurse/RN and consider discussing with provider and RRT  Notify: Charge Nurse/RN  Name of Charge Nurse/RN Notified Cheryl, RN  Date Charge Nurse/RN Notified 05/17/21  Time Charge Nurse/RN Notified 2350  Notify: Provider  Provider Name/Title N/A  Notify: Rapid Response  Name of Rapid Response RN Notified N/A  Document  Patient Outcome Not stable and remains on department  Progress note created (see row info) Yes

## 2021-05-18 NOTE — Evaluation (Signed)
Physical Therapy Evaluation and Discharge Patient Details Name: [name] MRN: [id number] DOB: August 15, 1960 Today's Date: 05/18/2021  History of Present Illness  Pt is a 61 y.o.M who presents 05/16/2021 with 1 week history of diarrhea. Admitted with metabolic acidosis with increased anion gap, C. difficile colitis, and AKI on CKD stage IV. PMH: chronic diastolic heart failure, stage IV CKD, CAD, HTN, CAD, scleroderma.  Clinical Impression  Patient evaluated by Physical Therapy with no further acute PT needs identified. Pt is likely close to his functional baseline. Pt ambulating room distances with no assistive device independently. Further mobility deferred as pt with continued diarrhea and needing to sit on toilet, however, he reports he feels it is improving. Encouraged continued mobilization as tolerated while inpatient. All education has been completed and the patient has no further questions. No follow-up Physical Therapy or equipment needs. PT is signing off. Thank you for this referral.      Recommendations for follow up therapy are one component of a multi-disciplinary discharge planning process, led by the attending physician.  Recommendations may be updated based on patient status, additional functional criteria and insurance authorization.  Follow Up Recommendations No PT follow up    Assistance Recommended at Discharge None  Patient can return home with the following       Equipment Recommendations None recommended by PT  Recommendations for Other Services       Functional Status Assessment Patient has not had a recent decline in their functional status     Precautions / Restrictions Precautions Precautions: None Restrictions Weight Bearing Restrictions: No      Mobility  Bed Mobility Overal bed mobility: Modified Independent                  Transfers Overall transfer level: Independent Equipment used: None                     Ambulation/Gait Ambulation/Gait assistance: Independent Gait Distance (Feet): 30 Feet Assistive device: None Gait Pattern/deviations: WFL(Within Functional Limits)          Stairs            Wheelchair Mobility    Modified Rankin (Stroke Patients Only)       Balance Overall balance assessment: No apparent balance deficits (not formally assessed)                                           Pertinent Vitals/Pain Pain Assessment Pain Assessment: No/denies pain    Home Living Family/patient expects to be discharged to:: Private residence Living Arrangements: Spouse/significant other Available Help at Discharge: Family Type of Home: House Home Access: Stairs to enter Entrance Stairs-Rails: Left Entrance Stairs-Number of Steps: 4 (4 in back, 2 in front)   Home Layout: One level Home Equipment: None      Prior Function Prior Level of Function : Independent/Modified Independent                     Hand Dominance        Extremity/Trunk Assessment   Upper Extremity Assessment Upper Extremity Assessment: Overall WFL for tasks assessed    Lower Extremity Assessment Lower Extremity Assessment: Overall WFL for tasks assessed    Cervical / Trunk Assessment Cervical / Trunk Assessment: Normal  Communication   Communication: No difficulties  Cognition Arousal/Alertness: Awake/alert Behavior During Therapy: WFL for  tasks assessed/performed Overall Cognitive Status: Within Functional Limits for tasks assessed                                          General Comments      Exercises     Assessment/Plan    PT Assessment Patient does not need any further PT services  PT Problem List         PT Treatment Interventions      PT Goals (Current goals can be found in the Care Plan section)  Acute Rehab PT Goals Patient Stated Goal: resolved diarrhea PT Goal Formulation: All assessment and education complete, DC  therapy    Frequency       Co-evaluation               AM-PAC PT "6 Clicks" Mobility  Outcome Measure Help needed turning from your back to your side while in a flat bed without using bedrails?: None Help needed moving from lying on your back to sitting on the side of a flat bed without using bedrails?: None Help needed moving to and from a bed to a chair (including a wheelchair)?: None Help needed standing up from a chair using your arms (e.g., wheelchair or bedside chair)?: None Help needed to walk in hospital room?: None Help needed climbing 3-5 steps with a railing? : None 6 Click Score: 24    End of Session   Activity Tolerance: Patient tolerated treatment well Patient left: in bed;with call bell/phone within reach Nurse Communication: Mobility status PT Visit Diagnosis: Difficulty in walking, not elsewhere classified (R26.2)    Time: 0923-3007 PT Time Calculation (min) (ACUTE ONLY): 16 min   Charges:   PT Evaluation $PT Eval Low Complexity: [place], PT, DPT Acute Rehabilitation Services Pager [phone] Office [phone]   Deno Etienne 05/18/2021, 5:05 PM

## 2021-05-18 NOTE — TOC Benefit Eligibility Note (Signed)
Patient Teacher, English as a foreign language completed.    The patient is currently admitted and upon discharge could be taking Dificid 200 mg tablets.  The current 30 day co-pay is, $50.00.   The patient is insured through [place], [place] Patient [place] Patient Advocate Team Direct Number: [phone]  Fax: [phone]

## 2021-05-18 NOTE — TOC Initial Note (Signed)
Transition of Care [facility]) - Initial/Assessment Note    Patient Details  Name: [name] MRN: [id number] Date of Birth: 1960/05/01  Transition of Care [facility]) CM/SW Contact:    Tom-Johnson, Renea Ee, RN Phone Number: 05/18/2021, 10:57 AM  Clinical Narrative:                  CM spoke with patient and wife, sharon at bedside about needs for post hospital transition. Patient is from home with wife and has six living children and one deceased. Admitted for Metabolic Acidosis. Currently on disability. Able to drive self to and from appointments. Wife transports if needed. States he does not have DME's at home. PCP is [name], Ravisankar, MD [facility] [address]. Awaiting PT/OT recommendations. CM will continue to follow with needs.    Barriers to Discharge: Continued Medical Work up   Patient Goals and CMS Choice Patient states their goals for this hospitalization and ongoing recovery are:: To return home with wife. CMS Medicare.gov Compare Post Acute Care list provided to:: Patient    Expected Discharge Plan and Services     Discharge Planning Services: CM Consult   Living arrangements for the past 2 months: Single Family Home                                      Prior Living Arrangements/Services Living arrangements for the past 2 months: Single Family Home   Patient language and need for interpreter reviewed:: Yes Do you feel safe going back to the place where you live?: Yes      Need for Family Participation in Patient Care: Yes (Comment) Care giver support system in place?: Yes (comment)   Criminal Activity/Legal Involvement Pertinent to Current Situation/Hospitalization: No - Comment as needed  Activities of Daily Living Home Assistive Devices/Equipment: None ADL Screening (condition at time of admission) Patient's cognitive ability adequate to safely complete daily activities?: Yes Is the patient deaf or have difficulty hearing?:  No Does the patient have difficulty seeing, even when wearing glasses/contacts?: No Does the patient have difficulty concentrating, remembering, or making decisions?: No Patient able to express need for assistance with ADLs?: Yes Does the patient have difficulty dressing or bathing?: No Independently performs ADLs?: Yes (appropriate for developmental age) Does the patient have difficulty walking or climbing stairs?: No Weakness of Legs: None Weakness of Arms/Hands: None  Permission Sought/Granted Permission sought to share information with : Case Manager, Family Supports Permission granted to share information with : Yes, Verbal Permission Granted              Emotional Assessment Appearance:: Appears stated age Attitude/Demeanor/Rapport: Engaged, Gracious Affect (typically observed): Accepting, Appropriate, Calm, Hopeful Orientation: : Oriented to Self, Oriented to Place, Oriented to  Time, Oriented to Situation Alcohol / Substance Use: Not Applicable Psych Involvement: No (comment)  Admission diagnosis:  Dehydration [N56.2] Metabolic acidosis [Z30.86] AKI (acute kidney injury) ([place]) [V78.4] Metabolic acidosis with increased anion gap and accumulation of organic acids [E87.20] Diarrhea, unspecified type [R19.7] Patient Active Problem List   Diagnosis Date Noted   Metabolic acidosis with increased anion gap and accumulation of organic acids 05/17/2021   Diarrhea 05/17/2021   HCAP (healthcare-associated pneumonia) 02/20/2021   Acute blood loss anemia    Benign neoplasm of sigmoid colon    Acute lower GI bleeding 02/17/2021   CAD (coronary artery disease)    (HFpEF) heart  failure with preserved ejection fraction (HCC)    CKD (chronic kidney disease), stage IV (HCC)    Sepsis ([place]) 11/07/2019   Pleural effusion 11/07/2019   Sepsis associated hypotension (HCC) 11/07/2019   Intractable vomiting    Abnormal CT scan, small bowel    SBO (small bowel obstruction) ([place])  09/28/2019   History of colonic polyps 09/25/2019   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   Spitting blood 09/25/2019   Protein-calorie malnutrition, severe 02/11/2019   S/P pericardial surgery 02/10/2019   GIB (gastrointestinal bleeding) 09/14/2018   Arteriovenous malformation of gastrointestinal tract    Gastritis and gastroduodenitis    Adenomatous polyp of transverse colon    Anemia 08/29/2018   Symptomatic anemia 08/28/2018   Acute renal failure superimposed on stage 4 chronic kidney disease ([place]) 08/28/2018   GI bleed 15/07/1362   Chronic systolic (congestive) heart failure (HCC)    Melena    Pulmonary hypertension ([place]) 06/18/2018   Left ventricular dysfunction 06/18/2018   Raynaud's syndrome without gangrene 05/24/2016   Acute CHF (congestive heart failure) ([place]) 02/24/2016   Essential hypertension 02/24/2016   Pericardial effusion 05/01/2014   Hyperlipidemia 10/18/2012   Hypertension 05/14/2012   CAD S/P percutaneous coronary angioplasty 04/26/2012   Gout 04/26/2012   Vitiligo    Smoker 06/16/2011   Chronic renal insufficiency, stage 3 (moderate) ([place]) 06/16/2011   Asthma, 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Phone: [phone]: [phone]    Social Determinants of Health (SDOH) Interventions    Readmission Risk Interventions Readmission Risk Prevention Plan 11/10/2019 02/11/2019  Transportation Screening Complete Complete  PCP or Specialist Appt within 3-5 Days Complete Complete  [place] or [place]   Social Work Consult for RBairdPlanning/Counseling Complete Complete  Palliative Care Screening Not Applicable Not Applicable  Medication Review (Press photographer Complete Complete  Some recent data might be hidden

## 2021-05-18 NOTE — Progress Notes (Signed)
PROGRESS NOTE    [name]  SNK:[id number] DOB: 1960/07/10 DOA: 05/16/2021 PCP: [name], MD   Brief Narrative: 61 year old past medical history significant for chronic diastolic heart failure, stage IV CKD, CAD, hypertension, CAD, scleroderma, pericardial effusion status post window(2010, 2011), hypothyroidism who presents with 1 week history of diarrhea.  Denies recent.  No nausea or vomiting.  Multiple episode of bowel movement more than 10.  Patient admitted with C. difficile colitis, AKI on CKD stage IV, metabolic acidosis.   Assessment & Plan:   Principal Problem:   Metabolic acidosis with increased anion gap and accumulation of organic acids Active Problems:   Smoker   Hyperlipidemia   Essential hypertension   Pulmonary hypertension (HCC)   Acute renal failure superimposed on stage 4 chronic kidney disease (HCC)   Diarrhea  1-Metabolic acidosis with increased anion gap -Multifactorial related to diarrhea and CKD. Received IV fluids on admission.  Plan to change to bicarb drip.  Bicarb is still 9 -Plan to repeat be met in the morning, and this afternoon.   2-C. difficile Colitis: Diarrhea Presented with 1 week history of diarrhea.  C. difficile positive. Started on Fidaxomicin.  White Blood cell trending down.  3-AKI on CKD stage IV: Prior creatinine range 3.5--4 Creatinine peaked 6.5 mission. Suspect related to hypovolemia in the setting of diarrheal illness Plan to continue with IV fluids, strict I's and O's, renal ultrasound. If not improving in the next 1 to 2 days might need nephrology consultation   4-Hypokalemia: Replete orally.  Repeat be met this afternoon outpatient as well as be started on bicarb drip  5-pulmonary hypertension: Continue with macitentan  6-Hypertension: As needed hydralazine  7-Hyperlipidemia: Continue with Crestor  8-Current smoker: Encourage smoking cessation. Nicotine patch.   Anemia: Suspect  anemia of chronic  kidney disease: Follow hemoglobin trend. Mild hyponatremia: Continue with IV fluids  Hypocalcemia; correction by albumin 7.4  Estimated body mass index is 16.83 kg/m as calculated from the following:   Height as of this encounter: _0  (1.753 m).   Weight as of 03/15/21: 51.7 kg.   DVT prophylaxis: Lovenox Code Status: Full code Family Communication: care discussed with wife.  Disposition Plan:  Status is: Observation  The patient will require care spanning > 2 midnights and should be moved to inpatient because: Admitted with acute renal failure on chronic, metabolic acidosis, C. difficile colitis.       Consultants:  None  Procedures:  Renal US>   Antimicrobials:  Dificid   Subjective: He denies abdominal pain, he report 2 bowel movement so far watery.  He also had 2 bowel movement yesterday watery.  No recent antibiotics use.  Objective: Vitals:   05/18/21 0150 05/18/21 0345 05/18/21 0447 05/18/21 0547  BP: 122/70 137/81 (!) 102/59   Pulse: 99 100 (!) 102   Resp: 20 20    Temp: 98.4 F (36.9 C) 98 F (36.7 C) 99 F (37.2 C)   TempSrc:   Oral   SpO2: 98% 99% 100%   Height:    _1  (1.753 m)    Intake/Output Summary (Last 24 hours) at 05/18/2021 0757 Last data filed at 05/18/2021 0539 Gross per 24 hour  Intake 2083.8 ml  Output --  Net 2083.8 ml   There were no vitals filed for this visit.  Examination:  General exam: Appears calm and comfortable  Respiratory system: Clear to auscultation. Respiratory effort normal. Cardiovascular system: S1 & S2 heard, RRR Gastrointestinal system: Abdomen is nondistended, soft  and nontender. No organomegaly or masses felt. Normal bowel sounds heard. Central nervous system: Alert and oriented. No focal neurological deficits. Extremities: Symmetric 5 x 5 power.    Data Reviewed: I have personally reviewed following labs and imaging studies  CBC: Recent Labs  Lab 05/16/21 1641 05/18/21 0210  WBC 14.8* 13.3*   HGB 9.6* 8.3*  HCT 31.4* 25.8*  MCV 97.5 93.1  PLT 232 976   Basic Metabolic Panel: Recent Labs  Lab 05/16/21 1641 05/18/21 0210  NA 139 132*  K 4.0 3.3*  CL 109 110  CO2 11* 9*  GLUCOSE 99 80  BUN 88* 102*  CREATININE 6.17* 6.55*  CALCIUM 7.5* 6.4*   GFR: CrCl cannot be calculated (Unknown ideal weight.). Liver Function Tests: Recent Labs  Lab 05/16/21 1641 05/18/21 0210  AST 23 28  ALT 16 19  ALKPHOS 82 73  BILITOT 0.6 0.5  PROT 6.8 5.9*  ALBUMIN 2.9* 2.4*   Recent Labs  Lab 05/16/21 1641  LIPASE 23   No results for input(s): AMMONIA in the last 168 hours. Coagulation Profile: No results for input(s): INR, PROTIME in the last 168 hours. Cardiac Enzymes: Recent Labs  Lab 05/17/21 0926  CKTOTAL 307   BNP (last 3 results) No results for input(s): PROBNP in the last 8760 hours. HbA1C: No results for input(s): HGBA1C in the last 72 hours. CBG: No results for input(s): GLUCAP in the last 168 hours. Lipid Profile: No results for input(s): CHOL, HDL, LDLCALC, TRIG, CHOLHDL, LDLDIRECT in the last 72 hours. Thyroid Function Tests: No results for input(s): TSH, T4TOTAL, FREET4, T3FREE, THYROIDAB in the last 72 hours. Anemia Panel: No results for input(s): VITAMINB12, FOLATE, FERRITIN, TIBC, IRON, RETICCTPCT in the last 72 hours. Sepsis Labs: Recent Labs  Lab 05/17/21 1200  LATICACIDVEN 1.8    Recent Results (from the past 240 hour(s))  C Difficile Quick Screen w PCR reflex     Status: Abnormal   Collection Time: 05/17/21  9:43 AM   Specimen: STOOL  Result Value Ref Range Status   C Diff antigen POSITIVE (A) NEGATIVE Final    Comment: CRITICAL RESULT CALLED TO, READ BACK BY AND VERIFIED WITH: RN Ardeth Perfect  [id number] _0  FH    C Diff toxin POSITIVE (A) NEGATIVE Final   C Diff interpretation Toxin producing C. difficile detected.  Final    Comment: Performed at [facility], [place] [address], [place], [place] [address]  C. Diff by PCR, Reflexed      Status: Abnormal   Collection Time: 05/17/21  9:43 AM  Result Value Ref Range Status   Toxigenic C. Difficile by PCR POSITIVE (A) NEGATIVE Final    Comment: Positive for toxigenic C. difficile with little to no toxin production. Only treat if clinical presentation suggests symptomatic illness. Performed at [facility], [place] [address], [place], [place] [address]   Resp Panel by RT-PCR (Flu A&B, Covid) Nasopharyngeal Swab     Status: None   Collection Time: 05/17/21 11:22 AM   Specimen: Nasopharyngeal Swab; Nasopharyngeal(NP) swabs in vial transport medium  Result Value Ref Range Status   SARS Coronavirus 2 by RT PCR NEGATIVE NEGATIVE Final    Comment: (NOTE) SARS-CoV-2 target nucleic acids are NOT DETECTED.  The SARS-CoV-2 RNA is generally detectable in upper respiratory specimens during the acute phase of infection. The lowest concentration of SARS-CoV-2 viral copies this assay can detect is 138 copies/mL. A negative result does not preclude SARS-Cov-2 infection and should not  be used as the sole basis for treatment or other patient management decisions. A negative result may occur with  improper specimen collection/handling, submission of specimen other than nasopharyngeal swab, presence of viral mutation(s) within the areas targeted by this assay, and inadequate number of viral copies(<138 copies/mL). A negative result must be combined with clinical observations, patient history, and epidemiological information. The expected result is Negative.  Fact Sheet for Patients:  EntrepreneurPulse.com.au  Fact Sheet for Healthcare Providers:  IncredibleEmployment.be  This test is no t yet approved or cleared by the [place] FDA and  has been authorized for detection and/or diagnosis of SARS-CoV-2 by FDA under an Emergency Use Authorization (EUA). This EUA will remain  in effect (meaning this test can be used) for the duration of  the COVID-19 declaration under Section 564(b)(1) of the Act, 21 U.S.C.section 360bbb-3(b)(1), unless the authorization is terminated  or revoked sooner.       Influenza A by PCR NEGATIVE NEGATIVE Final   Influenza B by PCR NEGATIVE NEGATIVE Final    Comment: (NOTE) The Xpert Xpress SARS-CoV-2/FLU/RSV plus assay is intended as an aid in the diagnosis of influenza from Nasopharyngeal swab specimens and should not be used as a sole basis for treatment. Nasal washings and aspirates are unacceptable for Xpert Xpress SARS-CoV-2/FLU/RSV testing.  Fact Sheet for Patients: EntrepreneurPulse.com.au  Fact Sheet for Healthcare Providers: IncredibleEmployment.be  This test is not yet approved or cleared by the [place] FDA and has been authorized for detection and/or diagnosis of SARS-CoV-2 by FDA under an Emergency Use Authorization (EUA). This EUA will remain in effect (meaning this test can be used) for the duration of the COVID-19 declaration under Section 564(b)(1) of the Act, 21 U.S.C. section 360bbb-3(b)(1), unless the authorization is terminated or revoked.  Performed at [facility], [place] [address], [place], [place] [address]          Radiology Studies: No results found.      Scheduled Meds:  allopurinol  100 mg Oral Daily   enoxaparin (LOVENOX) injection  30 mg Subcutaneous Q24H   fidaxomicin  200 mg Oral BID   macitentan  10 mg Oral Daily   nicotine  14 mg Transdermal Daily   pantoprazole  40 mg Oral Daily   potassium chloride  20 mEq Oral Once   rosuvastatin  20 mg Oral Daily   sodium chloride flush  3 mL Intravenous Q12H   Continuous Infusions:  lactated ringers 75 mL/hr at 05/18/21 0017     LOS: 0 days    Time spent: 35 minutes    Cliffton Spradley A Alexica Schlossberg, MD [facility]   If 7PM-7AM, please contact night-coverage [email or web address]  05/18/2021, 7:57 AM

## 2021-05-19 ENCOUNTER — Ambulatory Visit: Payer: 59 | Admitting: Physician Assistant

## 2021-05-19 DIAGNOSIS — E872 Acidosis, unspecified: Secondary | ICD-10-CM | POA: Diagnosis not present

## 2021-05-19 LAB — BASIC METABOLIC PANEL
Anion gap: 14 (ref 5–15)
Anion gap: 11 (ref 5–15)
Anion gap: 9 (ref 5–15)
BUN: 86 mg/dL — ABNORMAL HIGH (ref 8–23)
BUN: 83 mg/dL — ABNORMAL HIGH (ref 8–23)
BUN: 73 mg/dL — ABNORMAL HIGH (ref 8–23)
CO2: 9 mmol/L — ABNORMAL LOW (ref 22–32)
CO2: 13 mmol/L — ABNORMAL LOW (ref 22–32)
CO2: 14 mmol/L — ABNORMAL LOW (ref 22–32)
Calcium: 6.3 mg/dL — CL (ref 8.9–10.3)
Calcium: 6.3 mg/dL — CL (ref 8.9–10.3)
Calcium: 6.3 mg/dL — CL (ref 8.9–10.3)
Chloride: 114 mmol/L — ABNORMAL HIGH (ref 98–111)
Chloride: 112 mmol/L — ABNORMAL HIGH (ref 98–111)
Chloride: 115 mmol/L — ABNORMAL HIGH (ref 98–111)
Creatinine, Ser: 5.3 mg/dL — ABNORMAL HIGH (ref 0.61–1.24)
Creatinine, Ser: 5.53 mg/dL — ABNORMAL HIGH (ref 0.61–1.24)
Creatinine, Ser: 5.04 mg/dL — ABNORMAL HIGH (ref 0.61–1.24)
GFR, Estimated: 12 mL/min — ABNORMAL LOW (ref >60)
GFR, Estimated: 11 mL/min — ABNORMAL LOW (ref >60)
GFR, Estimated: 12 mL/min — ABNORMAL LOW (ref >60)
Glucose, Bld: 56 mg/dL — ABNORMAL LOW (ref 70–99)
Glucose, Bld: 190 mg/dL — ABNORMAL HIGH (ref 70–99)
Glucose, Bld: 155 mg/dL — ABNORMAL HIGH (ref 70–99)
Potassium: 3 mmol/L — ABNORMAL LOW (ref 3.5–5.1)
Potassium: 2.8 mmol/L — ABNORMAL LOW (ref 3.5–5.1)
Potassium: 3.4 mmol/L — ABNORMAL LOW (ref 3.5–5.1)
Sodium: 137 mmol/L (ref 135–145)
Sodium: 136 mmol/L (ref 135–145)
Sodium: 138 mmol/L (ref 135–145)

## 2021-05-19 LAB — CBC
HCT: 24.4 % — ABNORMAL LOW (ref 39.0–52.0)
Hemoglobin: 8.1 g/dL — ABNORMAL LOW (ref 13.0–17.0)
MCH: 30.8 pg (ref 26.0–34.0)
MCHC: 33.2 g/dL (ref 30.0–36.0)
MCV: 92.8 fL (ref 80.0–100.0)
Platelets: 174 10*3/uL (ref 150–400)
RBC: 2.63 MIL/uL — ABNORMAL LOW (ref 4.22–5.81)
RDW: 18.2 % — ABNORMAL HIGH (ref 11.5–15.5)
WBC: 14.2 10*3/uL — ABNORMAL HIGH (ref 4.0–10.5)
nRBC: 0 % (ref 0.0–0.2)

## 2021-05-19 LAB — GLUCOSE, CAPILLARY
Glucose-Capillary: 36 mg/dL — CL (ref 70–99)
Glucose-Capillary: 71 mg/dL (ref 70–99)
Glucose-Capillary: 168 mg/dL — ABNORMAL HIGH (ref 70–99)
Glucose-Capillary: 77 mg/dL (ref 70–99)
Glucose-Capillary: 132 mg/dL — ABNORMAL HIGH (ref 70–99)

## 2021-05-19 MED ORDER — SODIUM BICARBONATE 650 MG PO TABS
1300.0000 mg | ORAL_TABLET | Freq: Three times a day (TID) | ORAL | Status: DC
  Administered 2021-05-19 (×3): 1300 mg via ORAL
  Filled 2021-05-19 (×4): qty 2

## 2021-05-19 MED ORDER — POTASSIUM CHLORIDE CRYS ER 20 MEQ PO TBCR
40.0000 meq | EXTENDED_RELEASE_TABLET | Freq: Once | ORAL | Status: AC
  Administered 2021-05-19: 40 meq via ORAL
  Filled 2021-05-19: qty 2

## 2021-05-19 MED ORDER — DEXTROSE 50 % IV SOLN
INTRAVENOUS | Status: AC
  Administered 2021-05-19: 50 mL
  Filled 2021-05-19: qty 50

## 2021-05-19 MED ORDER — POTASSIUM CHLORIDE CRYS ER 20 MEQ PO TBCR
40.0000 meq | EXTENDED_RELEASE_TABLET | ORAL | Status: AC
  Administered 2021-05-19 (×2): 40 meq via ORAL
  Filled 2021-05-19 (×2): qty 2

## 2021-05-19 MED ORDER — CALCIUM CARBONATE 1250 (500 CA) MG PO TABS
1.0000 | ORAL_TABLET | Freq: Two times a day (BID) | ORAL | Status: DC
  Administered 2021-05-19 – 2021-05-23 (×9): 500 mg via ORAL
  Filled 2021-05-19 (×9): qty 1

## 2021-05-19 MED ORDER — SODIUM CHLORIDE 0.9 % IV BOLUS
500.0000 mL | Freq: Once | INTRAVENOUS | Status: AC
  Administered 2021-05-19: 500 mL via INTRAVENOUS

## 2021-05-19 MED ORDER — NEPRO/CARBSTEADY PO LIQD: 237.0000 mL | Freq: Two times a day (BID) | ORAL | Status: DC

## 2021-05-19 MED ORDER — LACTATED RINGERS IV BOLUS
250.0000 mL | Freq: Once | INTRAVENOUS | Status: AC
  Administered 2021-05-19: 250 mL via INTRAVENOUS

## 2021-05-19 MED ORDER — POTASSIUM CHLORIDE CRYS ER 20 MEQ PO TBCR: 40.0000 meq | EXTENDED_RELEASE_TABLET | Freq: Once | ORAL | Status: DC

## 2021-05-19 MED ORDER — SODIUM BICARBONATE 8.4 % IV SOLN
INTRAVENOUS | Status: DC
  Filled 2021-05-19: qty 1000

## 2021-05-19 MED ORDER — DEXTROSE IN LACTATED RINGERS 5 % IV SOLN: INTRAVENOUS | Status: DC

## 2021-05-19 MED ORDER — ROSUVASTATIN CALCIUM 5 MG PO TABS
10.0000 mg | ORAL_TABLET | Freq: Every day | ORAL | Status: DC
  Administered 2021-05-19 – 2021-05-23 (×5): 10 mg via ORAL
  Filled 2021-05-19 (×5): qty 2

## 2021-05-19 MED ORDER — CALCIUM GLUCONATE-NACL 1-0.675 GM/50ML-% IV SOLN
1.0000 g | Freq: Once | INTRAVENOUS | Status: AC
  Administered 2021-05-20: 1000 mg via INTRAVENOUS
  Filled 2021-05-19: qty 50

## 2021-05-19 MED ORDER — LACTATED RINGERS IV SOLN: INTRAVENOUS | Status: DC

## 2021-05-19 MED ORDER — POTASSIUM CHLORIDE 20 MEQ PO PACK
20.0000 meq | PACK | Freq: Once | ORAL | Status: AC
  Administered 2021-05-19: 20 meq via ORAL
  Filled 2021-05-19: qty 1

## 2021-05-19 MED ORDER — ADULT MULTIVITAMIN W/MINERALS CH
1.0000 | ORAL_TABLET | Freq: Every day | ORAL | Status: DC
  Administered 2021-05-19 – 2021-05-23 (×5): 1 via ORAL
  Filled 2021-05-19 (×5): qty 1

## 2021-05-19 NOTE — Progress Notes (Addendum)
PROGRESS NOTE    [name]  UXL:[id number] DOB: [birth year] DOA: 05/16/2021 PCP: [name], MD   Brief Narrative: 61 year old past medical history significant for chronic diastolic heart failure, stage IV CKD, CAD, hypertension, CAD, scleroderma, pericardial effusion status post window(2010, 2011), hypothyroidism who presents with 1 week history of diarrhea.  Denies recent.  No nausea or vomiting.  Multiple episode of bowel movement more than 10.  Patient admitted with C. difficile colitis, AKI on CKD stage IV, metabolic acidosis.   Assessment & Plan:   Principal Problem:   Metabolic acidosis with increased anion gap and accumulation of organic acids Active Problems:   Smoker   Hyperlipidemia   Essential hypertension   Pulmonary hypertension (HCC)   Acute renal failure superimposed on stage 4 chronic kidney disease (HCC)   Diarrhea  1-Metabolic acidosis with increased anion gap: -Multifactorial related to diarrhea and CKD. - Received Bicarb Gtt. Continue with oral Bicarb table.  -Continue with IV fluids, change to LR, due to hypokalemia.   2-C. difficile Colitis: Diarrhea Presented with 1 week history of diarrhea.  C. difficile positive. Started on Fidaxomicin. Day 2 of treatment.  WBC at 14, stable.  Report 4 BM yesterday, 3 today so far. Less frequent than on admission.  If not improvement by tomorrow with consult ID.   3-AKI on CKD stage IV: -Prior creatinine range 3.5--4 -Creatinine peaked 6.5 mission. Down to 5.  -Suspect related to hypovolemia in the setting of diarrheal illness -Plan to continue with IV fluids. -Renal US; Both kidneys demonstrate increased echogenicity consistent with chronic kidney disease. No evidence of hydronephrosis bilaterally. -Some improvement of renal function today, continue with IV fluids and monitor.  -Urine out put 900 CC.    4-Hypokalemia: Replete orally 3 doses. .  Repeat be met tonight.   5-pulmonary  hypertension: Continue with macitentan  6-Hypertension: As needed hydralazine  7-Hyperlipidemia: Continue with Crestor  8-Current smoker: Encourage smoking cessation. Nicotine patch.   Anemia: Suspect  anemia of chronic kidney disease: Follow hemoglobin trend. Mild hyponatremia: Continue with IV fluids  Hypocalcemia; correction by albumin 7.4 Hypoglycemia; started on D 5 IV fluids.  Estimated body mass index is 16.83 kg/m as calculated from the following:   Height as of this encounter: _0  (1.753 m).   Weight as of 03/15/21: 51.7 kg.   DVT prophylaxis: Lovenox Code Status: Full code Family Communication: care discussed with wife.  Disposition Plan:  Status is: Observation  The patient will require care spanning > 2 midnights and should be moved to inpatient because: Admitted with acute renal failure on chronic, metabolic acidosis, C. difficile colitis.       Consultants:  None  Procedures:  Renal US>   Antimicrobials:  Dificid   Subjective: Report total 4 BM yesterday, 3 so far this am. Denies abdominal pain   Objective: Vitals:   05/18/21 0900 05/18/21 1722 05/18/21 2135 05/19/21 0458  BP: 115/67 131/73 (!) 145/69 119/70  Pulse: 100 (!) 57 65 97  Resp: _1 Temp: 99.2 F (37.3 C) 97.9 F (36.6 C) 98.2 F (36.8 C) 98 F (36.7 C)  TempSrc:    Oral  SpO2: 100% 98% 100%   Height:        Intake/Output Summary (Last 24 hours) at 05/19/2021 5366 Last data filed at 05/19/2021 0500 Gross per 24 hour  Intake 2955 ml  Output 900 ml  Net 2055 ml    There were no vitals filed for this visit.  Examination:  General exam: NAD Respiratory system: CTA Cardiovascular system: S 1, S 2 RRR Gastrointestinal system: BS present, soft, nt Central nervous system: non focal.  Extremities: no edema    Data Reviewed: I have personally reviewed following labs and imaging studies  CBC: Recent Labs  Lab 05/16/21 1641 05/18/21 0210 05/19/21 0236   WBC 14.8* 13.3* 14.2*  HGB 9.6* 8.3* 8.1*  HCT 31.4* 25.8* 24.4*  MCV 97.5 93.1 92.8  PLT 232 196 509    Basic Metabolic Panel: Recent Labs  Lab 05/16/21 1641 05/18/21 0210 05/18/21 1825 05/19/21 0236  NA 139 132* 138 137  K 4.0 3.3* 3.3* 3.0*  CL 109 110 113* 114*  CO2 11* 9* 9* 9*  GLUCOSE 99 80 68* 56*  BUN 88* 102* 91* 86*  CREATININE 6.17* 6.55* 5.50* 5.30*  CALCIUM 7.5* 6.4* 6.3* 6.3*    GFR: CrCl cannot be calculated (Unknown ideal weight.). Liver Function Tests: Recent Labs  Lab 05/16/21 1641 05/18/21 0210  AST 23 28  ALT 16 19  ALKPHOS 82 73  BILITOT 0.6 0.5  PROT 6.8 5.9*  ALBUMIN 2.9* 2.4*    Recent Labs  Lab 05/16/21 1641  LIPASE 23    No results for input(s): AMMONIA in the last 168 hours. Coagulation Profile: No results for input(s): INR, PROTIME in the last 168 hours. Cardiac Enzymes: Recent Labs  Lab 05/17/21 0926  CKTOTAL 307    BNP (last 3 results) No results for input(s): PROBNP in the last 8760 hours. HbA1C: No results for input(s): HGBA1C in the last 72 hours. CBG: No results for input(s): GLUCAP in the last 168 hours. Lipid Profile: No results for input(s): CHOL, HDL, LDLCALC, TRIG, CHOLHDL, LDLDIRECT in the last 72 hours. Thyroid Function Tests: No results for input(s): TSH, T4TOTAL, FREET4, T3FREE, THYROIDAB in the last 72 hours. Anemia Panel: No results for input(s): VITAMINB12, FOLATE, FERRITIN, TIBC, IRON, RETICCTPCT in the last 72 hours. Sepsis Labs: Recent Labs  Lab 05/17/21 1200  LATICACIDVEN 1.8     Recent Results (from the past 240 hour(s))  C Difficile Quick Screen w PCR reflex     Status: Abnormal   Collection Time: 05/17/21  9:43 AM   Specimen: STOOL  Result Value Ref Range Status   C Diff antigen POSITIVE (A) NEGATIVE Final    Comment: CRITICAL RESULT CALLED TO, READ BACK BY AND VERIFIED WITH: RN Ardeth Perfect  [id number] _0  FH    C Diff toxin POSITIVE (A) NEGATIVE Final   C Diff interpretation  Toxin producing C. difficile detected.  Final    Comment: Performed at [facility], [place] [address], [place], [place] [address]  Gastrointestinal Panel by PCR , Stool     Status: None   Collection Time: 05/17/21  9:43 AM   Specimen: STOOL  Result Value Ref Range Status   Campylobacter species NOT DETECTED NOT DETECTED Final   Plesimonas shigelloides NOT DETECTED NOT DETECTED Final   Salmonella species NOT DETECTED NOT DETECTED Final   Yersinia enterocolitica NOT DETECTED NOT DETECTED Final   Vibrio species NOT DETECTED NOT DETECTED Final   Vibrio cholerae NOT DETECTED NOT DETECTED Final   Enteroaggregative E coli (EAEC) NOT DETECTED NOT DETECTED Final   Enteropathogenic E coli (EPEC) NOT DETECTED NOT DETECTED Final   Enterotoxigenic E coli (ETEC) NOT DETECTED NOT DETECTED Final   Shiga like toxin producing E coli (STEC) NOT DETECTED NOT DETECTED Final   Shigella/Enteroinvasive E coli (EIEC) NOT DETECTED NOT DETECTED Final  Cryptosporidium NOT DETECTED NOT DETECTED Final   Cyclospora cayetanensis NOT DETECTED NOT DETECTED Final   Entamoeba histolytica NOT DETECTED NOT DETECTED Final   Giardia lamblia NOT DETECTED NOT DETECTED Final   Adenovirus F40/41 NOT DETECTED NOT DETECTED Final   Astrovirus NOT DETECTED NOT DETECTED Final   Norovirus GI/GII NOT DETECTED NOT DETECTED Final   Rotavirus A NOT DETECTED NOT DETECTED Final   Sapovirus (I, II, IV, and V) NOT DETECTED NOT DETECTED Final    Comment: Performed at [facility], [address], [place], [place] [address]  C. Diff by PCR, Reflexed     Status: Abnormal   Collection Time: 05/17/21  9:43 AM  Result Value Ref Range Status   Toxigenic C. Difficile by PCR POSITIVE (A) NEGATIVE Final    Comment: Positive for toxigenic C. difficile with little to no toxin production. Only treat if clinical presentation suggests symptomatic illness. Performed at [facility], [place] [address], [place], [place] [address]   Resp  Panel by RT-PCR (Flu A&B, Covid) Nasopharyngeal Swab     Status: None   Collection Time: 05/17/21 11:22 AM   Specimen: Nasopharyngeal Swab; Nasopharyngeal(NP) swabs in vial transport medium  Result Value Ref Range Status   SARS Coronavirus 2 by RT PCR NEGATIVE NEGATIVE Final    Comment: (NOTE) SARS-CoV-2 target nucleic acids are NOT DETECTED.  The SARS-CoV-2 RNA is generally detectable in upper respiratory specimens during the acute phase of infection. The lowest concentration of SARS-CoV-2 viral copies this assay can detect is 138 copies/mL. A negative result does not preclude SARS-Cov-2 infection and should not be used as the sole basis for treatment or other patient management decisions. A negative result may occur with  improper specimen collection/handling, submission of specimen other than nasopharyngeal swab, presence of viral mutation(s) within the areas targeted by this assay, and inadequate number of viral copies(<138 copies/mL). A negative result must be combined with clinical observations, patient history, and epidemiological information. The expected result is Negative.  Fact Sheet for Patients:  EntrepreneurPulse.com.au  Fact Sheet for Healthcare Providers:  IncredibleEmployment.be  This test is no t yet approved or cleared by the [place] FDA and  has been authorized for detection and/or diagnosis of SARS-CoV-2 by FDA under an Emergency Use Authorization (EUA). This EUA will remain  in effect (meaning this test can be used) for the duration of the COVID-19 declaration under Section 564(b)(1) of the Act, 21 U.S.C.section 360bbb-3(b)(1), unless the authorization is terminated  or revoked sooner.       Influenza A by PCR NEGATIVE NEGATIVE Final   Influenza B by PCR NEGATIVE NEGATIVE Final    Comment: (NOTE) The Xpert Xpress SARS-CoV-2/FLU/RSV plus assay is intended as an aid in the diagnosis of influenza from Nasopharyngeal  swab specimens and should not be used as a sole basis for treatment. Nasal washings and aspirates are unacceptable for Xpert Xpress SARS-CoV-2/FLU/RSV testing.  Fact Sheet for Patients: EntrepreneurPulse.com.au  Fact Sheet for Healthcare Providers: IncredibleEmployment.be  This test is not yet approved or cleared by the [place] FDA and has been authorized for detection and/or diagnosis of SARS-CoV-2 by FDA under an Emergency Use Authorization (EUA). This EUA will remain in effect (meaning this test can be used) for the duration of the COVID-19 declaration under Section 564(b)(1) of the Act, 21 U.S.C. section 360bbb-3(b)(1), unless the authorization is terminated or revoked.  Performed at [facility], [place] [address] [place],  [address]  Radiology Studies: US RENAL  Result Date: 05/18/2021 CLINICAL DATA:  Acute kidney injury. EXAM: RENAL / URINARY TRACT ULTRASOUND COMPLETE COMPARISON:  Prior renal ultrasound on 10/03/2019 FINDINGS: Right Kidney: Renal measurements: 9.1 x 4.7 x 4.6 cm = volume: 103 mL. Echogenic cortex without significant atrophy. No evidence of hydronephrosis, focal mass or shadowing calculus. Left Kidney: Renal measurements: 9.2 x 5.6 x 4.9 cm = volume: 133 mL. Increased echogenicity of the renal cortex. No hydronephrosis, focal mass or shadowing calculus. Bladder: Appears normal for degree of bladder distention. Other: None. IMPRESSION: Both kidneys demonstrate increased echogenicity consistent with chronic kidney disease. No evidence of hydronephrosis bilaterally. Electronically Signed   By: Aletta Edouard M.D.   On: 05/18/2021 17:20        Scheduled Meds:  allopurinol  100 mg Oral Daily   calcium carbonate  1 tablet Oral BID WC   enoxaparin (LOVENOX) injection  30 mg Subcutaneous Q24H   fidaxomicin  200 mg Oral BID   macitentan  10 mg Oral Daily   nicotine  14 mg Transdermal Daily    pantoprazole  40 mg Oral Daily   potassium chloride  40 mEq Oral Once   potassium chloride  40 mEq Oral Once   rosuvastatin  20 mg Oral Daily   sodium bicarbonate  1,300 mg Oral TID   sodium chloride flush  3 mL Intravenous Q12H   Continuous Infusions:  sodium bicarbonate 150 mEq in D5W infusion       LOS: 1 day    Time spent: 35 minutes    Peter Keyworth A Davionte Lusby, MD [facility]   If 7PM-7AM, please contact night-coverage [email or web address]  05/19/2021, 7:27 AM

## 2021-05-19 NOTE — Progress Notes (Signed)
Hypoglycemic Event  CBG: 36  Treatment: 8 oz juice/soda and D50 50 mL (25 gm)  Symptoms: None  Follow-up CBG: [id number]:[id number] CBG Result:71  Possible Reasons for Event: Inadequate meal intake  Comments/MD notified:MD notified    Vira Agar

## 2021-05-19 NOTE — Evaluation (Signed)
Occupational Therapy Evaluation Patient Details Name: [name] MRN: [id number] DOB: 01/19/1961 Today's Date: 05/19/2021   History of Present Illness Pt is a 61 y.o.M who presents 05/16/2021 with 1 week history of diarrhea. Admitted with metabolic acidosis with increased anion gap, C. difficile colitis, and AKI on CKD stage IV. PMH: chronic diastolic heart failure, stage IV CKD, CAD, HTN, CAD, scleroderma.   Clinical Impression   Pt is at Ind baseline level of function with ADLs/selfcare and mobility. Pt lives at home with his wife and was Ind with all ADL, IADLs, was driving and used no AD for mobility. All education completed and no further acute OT services are indicated at this time      Recommendations for follow up therapy are one component of a multi-disciplinary discharge planning process, led by the attending physician.  Recommendations may be updated based on patient status, additional functional criteria and insurance authorization.   Follow Up Recommendations  No OT follow up    Assistance Recommended at Discharge None  Patient can return home with the following      Functional Status Assessment  Patient has not had a recent decline in their functional status  Equipment Recommendations  None recommended by OT    Recommendations for Other Services       Precautions / Restrictions Precautions Precautions: None Restrictions Weight Bearing Restrictions: No      Mobility Bed Mobility Overal bed mobility: Modified Independent                  Transfers Overall transfer level: Independent Equipment used: None                      Balance Overall balance assessment: No apparent balance deficits (not formally assessed)                                         ADL either performed or assessed with clinical judgement   ADL Overall ADL's : Independent;At baseline                                              Vision Baseline Vision/History: 1 Wears glasses Ability to See in Adequate Light: 0 Adequate Patient Visual Report: No change from baseline       Perception     Praxis      Pertinent Vitals/Pain Pain Assessment Pain Assessment: No/denies pain     Hand Dominance Right   Extremity/Trunk Assessment Upper Extremity Assessment Upper Extremity Assessment: Overall WFL for tasks assessed   Lower Extremity Assessment Lower Extremity Assessment: Defer to PT evaluation   Cervical / Trunk Assessment Cervical / Trunk Assessment: Normal   Communication Communication Communication: No difficulties   Cognition Arousal/Alertness: Awake/alert Behavior During Therapy: WFL for tasks assessed/performed, Agitated Overall Cognitive Status: Within Functional Limits for tasks assessed                                       General Comments       Exercises     Shoulder Instructions      Home Living Family/patient expects to be discharged to:: Private residence Living Arrangements: Spouse/significant other Available  Help at Discharge: Family Type of Home: House Home Access: Stairs to enter Technical brewer of Steps: 4 Entrance Stairs-Rails: Left Home Layout: One level     Bathroom Shower/Tub: Teacher, early years/pre: Standard     Home Equipment: None          Prior Functioning/Environment Prior Level of Function : Independent/Modified Independent                        OT Problem List: Decreased activity tolerance      OT Treatment/Interventions:      OT Goals(Current goals can be found in the care plan section) Acute Rehab OT Goals Patient Stated Goal: go home OT Goal Formulation: With patient/family  OT Frequency:      Co-evaluation              AM-PAC OT "6 Clicks" Daily Activity     Outcome Measure Help from another person eating meals?: None Help from another person taking care of personal grooming?:  None Help from another person toileting, which includes using toliet, bedpan, or urinal?: None Help from another person bathing (including washing, rinsing, drying)?: None Help from another person to put on and taking off regular upper body clothing?: None Help from another person to put on and taking off regular lower body clothing?: None 6 Click Score: 24   End of Session    Activity Tolerance: Patient tolerated treatment well Patient left: in bed;with call bell/phone within reach;with family/visitor present  OT Visit Diagnosis: Other abnormalities of gait and mobility (R26.89)                Time: 0230-1720 OT Time Calculation (min): 25 min Charges:  OT General Charges $OT Visit: 1 Visit OT Evaluation $OT Eval Low Complexity: 1 Low OT Treatments $Self Care/Home Management : 8-22 mins    Britt Bottom 05/19/2021, 1:07 PM

## 2021-05-19 NOTE — Progress Notes (Signed)
Initial Nutrition Assessment  DOCUMENTATION CODES:   Severe malnutrition in context of chronic illness, Underweight  INTERVENTION:  -weigh patient -Nepro Shake po BID, each supplement provides 425 kcal and 19 grams protein -MVI with minerals daily  NUTRITION DIAGNOSIS:   Severe Malnutrition related to chronic illness (CHF, CKD) as evidenced by severe muscle depletion, severe fat depletion.  GOAL:   Patient will meet greater than or equal to 90% of their needs  MONITOR:   PO intake, Supplement acceptance, Labs, Weight trends, Skin, I & O's  REASON FOR ASSESSMENT:   Malnutrition Screening Tool    ASSESSMENT:   Pt with PMH significant for CHF, stg IV CKD, CAD, HTN, scleroderma, pericardial effusion s/p window (2010, 2011), hypothyroidism admitted with C. Diff colitis, AKI on CKD stg IV, and metabolic acidosis.  1/24 - diet advanced to clear liquids 1/26 - diet advanced to renal  Pt reports 1 week h/o diarrhea with multiple episodes of 10+ BMs per day. Denies N/V. Pt was initially NPO upon admit with diet advanced to renal today. No PO intake has been documented since admission. Weight has not been updated since November 2022; however, weight appeared stable at that time. Note BMI <18 at that time and likely still is under 18. Will order new measured weight so weight history can be properly assessed.   UOP: 933m x24 hours I/O: [id number] admit  Medications: calcium carbonate, protonix, klor-con, sodium bicarbonate Labs: K+ 3.0 (L) CBGs:  36-71 x 24 hours  NUTRITION - FOCUSED PHYSICAL EXAM: Flowsheet Row Most Recent Value  Orbital Region Severe depletion  Upper Arm Region Severe depletion  Thoracic and Lumbar Region Severe depletion  Buccal Region Severe depletion  Temple Region Severe depletion  Clavicle Bone Region Severe depletion  Clavicle and Acromion Bone Region Severe depletion  Scapular Bone Region Severe depletion  Dorsal Hand Severe depletion  Patellar  Region Severe depletion  Anterior Thigh Region Severe depletion  Posterior Calf Region Severe depletion  Edema (RD Assessment) None  Hair Reviewed  Eyes Reviewed  Mouth Reviewed  Skin Reviewed  Nails Reviewed        Diet Order:   Diet Order             Diet renal with fluid restriction Room service appropriate? Yes; Fluid consistency: Thin  Diet effective now                   EDUCATION NEEDS:   No education needs have been identified at this time  Skin:  Skin Assessment: Reviewed RN Assessment  Last BM:  1/25  Height:   Ht Readings from Last 1 Encounters:  05/18/21 _0  (1.753 m)    Weight:   Wt Readings from Last 1 Encounters:  03/15/21 51.7 kg    BMI:  Body mass index is 16.83 kg/m.  Estimated Nutritional Needs:   Kcal:  1800-2000  Protein:  90-100 grams  Fluid:  >1.8L/d     AmTheone Stanley MS, RD, LDN (she/her/hers) RD pager number and weekend/on-call pager number located in [facility]

## 2021-05-20 ENCOUNTER — Inpatient Hospital Stay (HOSPITAL_COMMUNITY): Payer: Medicare Other

## 2021-05-20 ENCOUNTER — Other Ambulatory Visit (HOSPITAL_COMMUNITY): Payer: Self-pay

## 2021-05-20 DIAGNOSIS — E872 Acidosis, unspecified: Secondary | ICD-10-CM | POA: Diagnosis not present

## 2021-05-20 LAB — CBC
HCT: 24.7 % — ABNORMAL LOW (ref 39.0–52.0)
Hemoglobin: 8.3 g/dL — ABNORMAL LOW (ref 13.0–17.0)
MCH: 30.5 pg (ref 26.0–34.0)
MCHC: 33.6 g/dL (ref 30.0–36.0)
MCV: 90.8 fL (ref 80.0–100.0)
Platelets: 150 10*3/uL (ref 150–400)
RBC: 2.72 MIL/uL — ABNORMAL LOW (ref 4.22–5.81)
RDW: 18 % — ABNORMAL HIGH (ref 11.5–15.5)
WBC: 12.7 10*3/uL — ABNORMAL HIGH (ref 4.0–10.5)
nRBC: 0 % (ref 0.0–0.2)

## 2021-05-20 LAB — BASIC METABOLIC PANEL
Anion gap: 10 (ref 5–15)
BUN: 70 mg/dL — ABNORMAL HIGH (ref 8–23)
CO2: 12 mmol/L — ABNORMAL LOW (ref 22–32)
Calcium: 6.9 mg/dL — ABNORMAL LOW (ref 8.9–10.3)
Chloride: 116 mmol/L — ABNORMAL HIGH (ref 98–111)
Creatinine, Ser: 4.69 mg/dL — ABNORMAL HIGH (ref 0.61–1.24)
GFR, Estimated: 13 mL/min — ABNORMAL LOW (ref >60)
Glucose, Bld: 104 mg/dL — ABNORMAL HIGH (ref 70–99)
Potassium: 3.7 mmol/L (ref 3.5–5.1)
Sodium: 138 mmol/L (ref 135–145)

## 2021-05-20 LAB — GLUCOSE, CAPILLARY
Glucose-Capillary: 116 mg/dL — ABNORMAL HIGH (ref 70–99)
Glucose-Capillary: 117 mg/dL — ABNORMAL HIGH (ref 70–99)
Glucose-Capillary: 99 mg/dL (ref 70–99)
Glucose-Capillary: 79 mg/dL (ref 70–99)
Glucose-Capillary: 33 mg/dL — CL (ref 70–99)
Glucose-Capillary: 65 mg/dL — ABNORMAL LOW (ref 70–99)
Glucose-Capillary: 114 mg/dL — ABNORMAL HIGH (ref 70–99)
Glucose-Capillary: 71 mg/dL (ref 70–99)

## 2021-05-20 IMAGING — DX DG CHEST 1V PORT
1 series · 1 of 1 positions shown · non-contrast
Comparison: [DATE]

CLINICAL DATA: Cough

EXAM:
PORTABLE CHEST 1 VIEW

[chest]
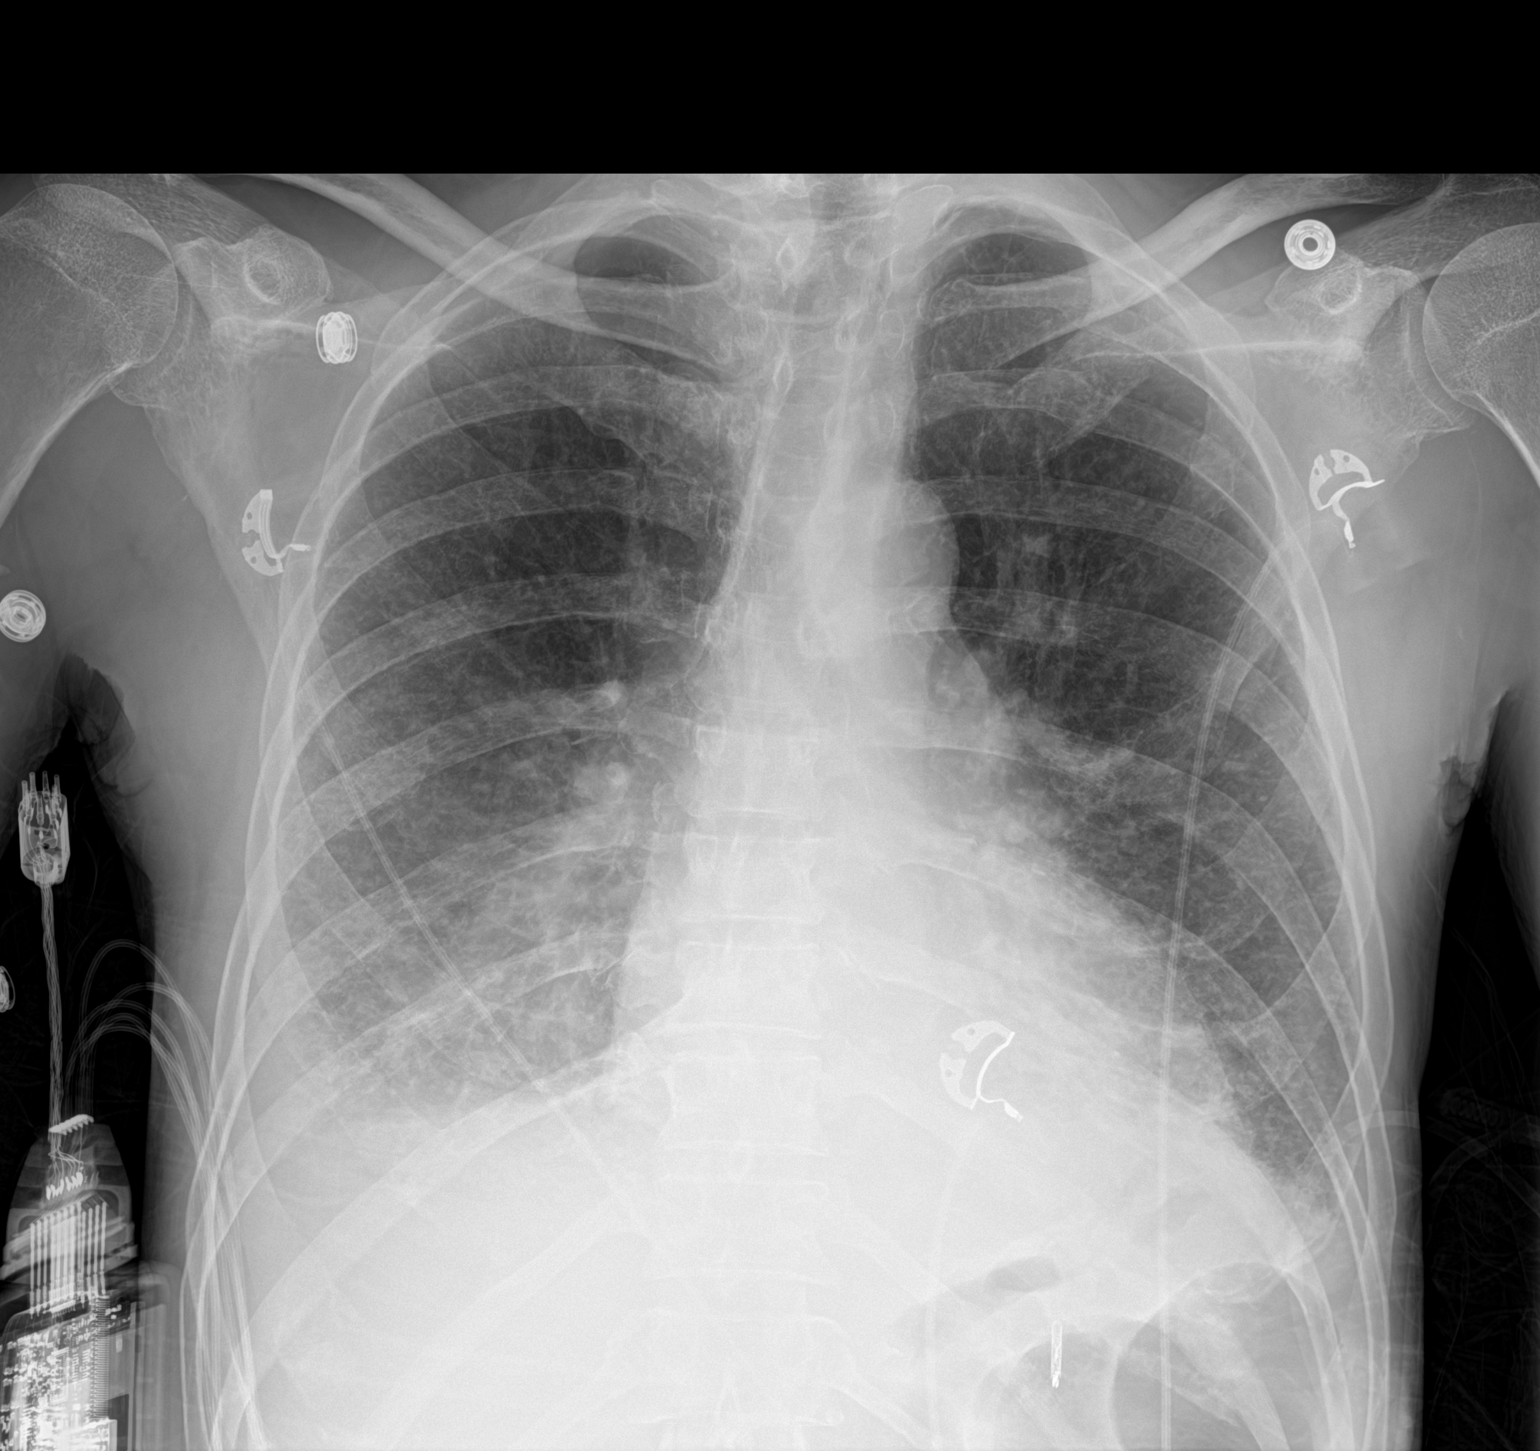

[1 of 1 positions shown; findings below may reference images not displayed]

FINDINGS: Cardiac silhouette and mediastinal contours are unchanged and within
normal limits. No significant change in bilateral mid and lower lung
heterogeneous airspace opacities and small right-greater-than-left
pleural effusions. No pneumothorax is seen. Mild multilevel
degenerative disc changes of the thoracic spine.
IMPRESSION: :
IMPRESSION: 1. No significant change from [DATE].
2. Right-greater-than-left heterogeneous airspace opacities and
small right-greater-than-left pleural effusions.

## 2021-05-20 MED ORDER — SODIUM BICARBONATE 650 MG PO TABS
1300.0000 mg | ORAL_TABLET | Freq: Three times a day (TID) | ORAL | 0 refills | Status: DC
  Filled 2021-05-20: qty 30, 4d supply, fill #0

## 2021-05-20 MED ORDER — SODIUM BICARBONATE 650 MG PO TABS
1300.0000 mg | ORAL_TABLET | Freq: Three times a day (TID) | ORAL | Status: DC
  Administered 2021-05-20 – 2021-05-23 (×14): 1300 mg via ORAL
  Filled 2021-05-20 (×12): qty 2

## 2021-05-20 MED ORDER — FIDAXOMICIN 200 MG PO TABS
200.0000 mg | ORAL_TABLET | Freq: Two times a day (BID) | ORAL | 0 refills | Status: AC
  Filled 2021-05-20: qty 14, 7d supply, fill #0

## 2021-05-20 MED ORDER — DEXTROSE 50 % IV SOLN
INTRAVENOUS | Status: AC
  Administered 2021-05-20: 50 mL
  Filled 2021-05-20: qty 50

## 2021-05-20 MED ORDER — CALCIUM CARBONATE ANTACID 500 MG PO CHEW
CHEWABLE_TABLET | Freq: Two times a day (BID) | ORAL | 0 refills | Status: DC
  Filled 2021-05-20: qty 20, 10d supply, fill #0

## 2021-05-20 MED ORDER — ASPIRIN EC 81 MG PO TBEC
DELAYED_RELEASE_TABLET | ORAL | Status: AC
  Filled 2021-05-20: qty 1

## 2021-05-20 NOTE — Progress Notes (Signed)
PROGRESS NOTE    [name]  XBM:[id number] DOB: Oct 31, 1960 DOA: 05/16/2021 PCP: [name], MD   Brief Narrative: 61 year old past medical history significant for chronic diastolic heart failure, stage IV CKD, CAD, hypertension, CAD, scleroderma, pericardial effusion status post window(2010, 2011), hypothyroidism who presents with 1 week history of diarrhea.  Denies recent.  No nausea or vomiting.  Multiple episode of bowel movement more than 10.  Patient admitted with C. difficile colitis, AKI on CKD stage IV, metabolic acidosis.   Assessment & Plan:   Principal Problem:   Metabolic acidosis with increased anion gap and accumulation of organic acids Active Problems:   Smoker   Hyperlipidemia   Essential hypertension   Pulmonary hypertension (HCC)   Acute renal failure superimposed on stage 4 chronic kidney disease (HCC)   Diarrhea  1-Metabolic acidosis with increased anion gap: -Multifactorial related to diarrhea and CKD. - Received Bicarb Gtt. Continue with oral Bicarb table increase to QID.  -Continue with IV fluids, decreased rate.  Improving.   2-C. difficile Colitis: Diarrhea Presented with 1 week history of diarrhea.  C. difficile positive. Started on Fidaxomicin. Day 3 of treatment.  WBC trending down at 12 tooday He had 6 BM yesterday. One BM so far today  Improving.   3-AKI on CKD stage IV: -Prior creatinine range 3.5--4.2 -Creatinine peaked 6.5 mission. Down to 5.  -Suspect related to hypovolemia in the setting of diarrheal illness -Plan to continue with IV fluids. -Renal US; Both kidneys demonstrate increased echogenicity consistent with chronic kidney disease. No evidence of hydronephrosis bilaterally. -cr down to 4.6 improved, getting to baseline.  -Urine out put 900 CC.    4-Hypokalemia: Replaced.   5-pulmonary hypertension: Continue with macitentan,   6-Hypertension: As needed hydralazine  7-Hyperlipidemia: Continue with  Crestor  8-Current smoker: Encourage smoking cessation. Nicotine patch.   Anemia: Suspect  anemia of chronic kidney disease: Follow hemoglobin trend. Hb stable 8.3 Mild hyponatremia: Continue with IV fluids  Hypocalcemia; correction by albumin 7.4. started on calcium supplement.   Hypoglycemia; started on D 5 IV fluids.  Resolved  Cough;  Check chest x ray today and is stable from 01/2021. Incentive spirometry ordered.   Estimated body mass index is 16.83 kg/m as calculated from the following:   Height as of this encounter: _0  (1.753 m).   Weight as of 03/15/21: 51.7 kg.   DVT prophylaxis: Lovenox Code Status: Full code Family Communication: care discussed with wife.  Disposition Plan:  Status is: Observation  The patient will require care spanning > 2 midnights and should be moved to inpatient because: Admitted with acute renal failure on chronic, metabolic acidosis, C. difficile colitis.       Consultants:  None  Procedures:  Renal US>   Antimicrobials:  Dificid   Subjective: He had 6 BM yesterday counting bowel movement at 4 am.  Denies abdominal pain.  I came to check on him in the afternoon, he has had only one BM today so far  Objective: Vitals:   05/19/21 1712 05/19/21 2120 05/20/21 0443 05/20/21 0933  BP: (!) 144/73 125/61 106/75 117/71  Pulse: 96 (!) 110 99 (!) 103  Resp: _1 Temp: 98.7 F (37.1 C) 98.5 F (36.9 C) 99.7 F (37.6 C) 98.1 F (36.7 C)  TempSrc: Oral Oral Oral Oral  SpO2: 100% 100%  100%  Height:        Intake/Output Summary (Last 24 hours) at 05/20/2021 1504 Last data filed at 05/20/2021  0600 Gross per 24 hour  Intake 2577.48 ml  Output 300 ml  Net 2277.48 ml    There were no vitals filed for this visit.  Examination:  General exam: NAD Respiratory system: CTA Cardiovascular system: S 1, s 2 RRR Gastrointestinal system: BS present, soft, nt Central nervous system: Non focal.  Extremities: No  edema    Data Reviewed: I have personally reviewed following labs and imaging studies  CBC: Recent Labs  Lab 05/16/21 1641 05/18/21 0210 05/19/21 0236 05/20/21 0056  WBC 14.8* 13.3* 14.2* 12.7*  HGB 9.6* 8.3* 8.1* 8.3*  HCT 31.4* 25.8* 24.4* 24.7*  MCV 97.5 93.1 92.8 90.8  PLT 232 196 174 098    Basic Metabolic Panel: Recent Labs  Lab 05/18/21 1825 05/19/21 0236 05/19/21 1042 05/19/21 1855 05/20/21 0056  NA 138 137 136 138 138  K 3.3* 3.0* 2.8* 3.4* 3.7  CL 113* 114* 112* 115* 116*  CO2 9* 9* 13* 14* 12*  GLUCOSE 68* 56* 190* 155* 104*  BUN 91* 86* 83* 73* 70*  CREATININE 5.50* 5.30* 5.53* 5.04* 4.69*  CALCIUM 6.3* 6.3* 6.3* 6.3* 6.9*    GFR: CrCl cannot be calculated (Unknown ideal weight.). Liver Function Tests: Recent Labs  Lab 05/16/21 1641 05/18/21 0210  AST 23 28  ALT 16 19  ALKPHOS 82 73  BILITOT 0.6 0.5  PROT 6.8 5.9*  ALBUMIN 2.9* 2.4*    Recent Labs  Lab 05/16/21 1641  LIPASE 23    No results for input(s): AMMONIA in the last 168 hours. Coagulation Profile: No results for input(s): INR, PROTIME in the last 168 hours. Cardiac Enzymes: Recent Labs  Lab 05/17/21 0926  CKTOTAL 307    BNP (last 3 results) No results for input(s): PROBNP in the last 8760 hours. HbA1C: No results for input(s): HGBA1C in the last 72 hours. CBG: Recent Labs  Lab 05/19/21 2015 05/19/21 2359 05/20/21 0441 05/20/21 0720 05/20/21 1123  GLUCAP 132* 116* 117* 99 79   Lipid Profile: No results for input(s): CHOL, HDL, LDLCALC, TRIG, CHOLHDL, LDLDIRECT in the last 72 hours. Thyroid Function Tests: No results for input(s): TSH, T4TOTAL, FREET4, T3FREE, THYROIDAB in the last 72 hours. Anemia Panel: No results for input(s): VITAMINB12, FOLATE, FERRITIN, TIBC, IRON, RETICCTPCT in the last 72 hours. Sepsis Labs: Recent Labs  Lab 05/17/21 1200  LATICACIDVEN 1.8     Recent Results (from the past 240 hour(s))  C Difficile Quick Screen w PCR reflex      Status: Abnormal   Collection Time: 05/17/21  9:43 AM   Specimen: STOOL  Result Value Ref Range Status   C Diff antigen POSITIVE (A) NEGATIVE Final    Comment: CRITICAL RESULT CALLED TO, READ BACK BY AND VERIFIED WITH: RN Ardeth Perfect  [id number] _0  FH    C Diff toxin POSITIVE (A) NEGATIVE Final   C Diff interpretation Toxin producing C. difficile detected.  Final    Comment: Performed at [facility], [place] [address], [place], [place] [address]  Gastrointestinal Panel by PCR , Stool     Status: None   Collection Time: 05/17/21  9:43 AM   Specimen: STOOL  Result Value Ref Range Status   Campylobacter species NOT DETECTED NOT DETECTED Final   Plesimonas shigelloides NOT DETECTED NOT DETECTED Final   Salmonella species NOT DETECTED NOT DETECTED Final   Yersinia enterocolitica NOT DETECTED NOT DETECTED Final   Vibrio species NOT DETECTED NOT DETECTED Final   Vibrio cholerae NOT DETECTED NOT DETECTED  Final   Enteroaggregative E coli (EAEC) NOT DETECTED NOT DETECTED Final   Enteropathogenic E coli (EPEC) NOT DETECTED NOT DETECTED Final   Enterotoxigenic E coli (ETEC) NOT DETECTED NOT DETECTED Final   Shiga like toxin producing E coli (STEC) NOT DETECTED NOT DETECTED Final   Shigella/Enteroinvasive E coli (EIEC) NOT DETECTED NOT DETECTED Final   Cryptosporidium NOT DETECTED NOT DETECTED Final   Cyclospora cayetanensis NOT DETECTED NOT DETECTED Final   Entamoeba histolytica NOT DETECTED NOT DETECTED Final   Giardia lamblia NOT DETECTED NOT DETECTED Final   Adenovirus F40/41 NOT DETECTED NOT DETECTED Final   Astrovirus NOT DETECTED NOT DETECTED Final   Norovirus GI/GII NOT DETECTED NOT DETECTED Final   Rotavirus A NOT DETECTED NOT DETECTED Final   Sapovirus (I, II, IV, and V) NOT DETECTED NOT DETECTED Final    Comment: Performed at [facility], [address] [place], [place] [address]  C. Diff by PCR, Reflexed     Status: Abnormal   Collection Time: 05/17/21  9:43 AM   Result Value Ref Range Status   Toxigenic C. Difficile by PCR POSITIVE (A) NEGATIVE Final    Comment: Positive for toxigenic C. difficile with little to no toxin production. Only treat if clinical presentation suggests symptomatic illness. Performed at [facility], [place] [address], [place], [place] [address]   Resp Panel by RT-PCR (Flu A&B, Covid) Nasopharyngeal Swab     Status: None   Collection Time: 05/17/21 11:22 AM   Specimen: Nasopharyngeal Swab; Nasopharyngeal(NP) swabs in vial transport medium  Result Value Ref Range Status   SARS Coronavirus 2 by RT PCR NEGATIVE NEGATIVE Final    Comment: (NOTE) SARS-CoV-2 target nucleic acids are NOT DETECTED.  The SARS-CoV-2 RNA is generally detectable in upper respiratory specimens during the acute phase of infection. The lowest concentration of SARS-CoV-2 viral copies this assay can detect is 138 copies/mL. A negative result does not preclude SARS-Cov-2 infection and should not be used as the sole basis for treatment or other patient management decisions. A negative result may occur with  improper specimen collection/handling, submission of specimen other than nasopharyngeal swab, presence of viral mutation(s) within the areas targeted by this assay, and inadequate number of viral copies(<138 copies/mL). A negative result must be combined with clinical observations, patient history, and epidemiological information. The expected result is Negative.  Fact Sheet for Patients:  EntrepreneurPulse.com.au  Fact Sheet for Healthcare Providers:  IncredibleEmployment.be  This test is no t yet approved or cleared by the [place] FDA and  has been authorized for detection and/or diagnosis of SARS-CoV-2 by FDA under an Emergency Use Authorization (EUA). This EUA will remain  in effect (meaning this test can be used) for the duration of the COVID-19 declaration under Section 564(b)(1) of the Act,  21 U.S.C.section 360bbb-3(b)(1), unless the authorization is terminated  or revoked sooner.       Influenza A by PCR NEGATIVE NEGATIVE Final   Influenza B by PCR NEGATIVE NEGATIVE Final    Comment: (NOTE) The Xpert Xpress SARS-CoV-2/FLU/RSV plus assay is intended as an aid in the diagnosis of influenza from Nasopharyngeal swab specimens and should not be used as a sole basis for treatment. Nasal washings and aspirates are unacceptable for Xpert Xpress SARS-CoV-2/FLU/RSV testing.  Fact Sheet for Patients: EntrepreneurPulse.com.au  Fact Sheet for Healthcare Providers: IncredibleEmployment.be  This test is not yet approved or cleared by the [place] FDA and has been authorized for detection and/or diagnosis of SARS-CoV-2 by  FDA under an Emergency Use Authorization (EUA). This EUA will remain in effect (meaning this test can be used) for the duration of the COVID-19 declaration under Section 564(b)(1) of the Act, 21 U.S.C. section 360bbb-3(b)(1), unless the authorization is terminated or revoked.  Performed at [facility], [place] [address] [place], [place] [address]           Radiology Studies: US RENAL  Result Date: 05/18/2021 CLINICAL DATA:  Acute kidney injury. EXAM: RENAL / URINARY TRACT ULTRASOUND COMPLETE COMPARISON:  Prior renal ultrasound on 10/03/2019 FINDINGS: Right Kidney: Renal measurements: 9.1 x 4.7 x 4.6 cm = volume: 103 mL. Echogenic cortex without significant atrophy. No evidence of hydronephrosis, focal mass or shadowing calculus. Left Kidney: Renal measurements: 9.2 x 5.6 x 4.9 cm = volume: 133 mL. Increased echogenicity of the renal cortex. No hydronephrosis, focal mass or shadowing calculus. Bladder: Appears normal for degree of bladder distention. Other: None. IMPRESSION: Both kidneys demonstrate increased echogenicity consistent with chronic kidney disease. No evidence of hydronephrosis bilaterally.  Electronically Signed   By: Aletta Edouard M.D.   On: 05/18/2021 17:20   DG CHEST PORT 1 VIEW  Result Date: 05/20/2021 CLINICAL DATA:  Cough EXAM: PORTABLE CHEST 1 VIEW COMPARISON:  02/21/2021 FINDINGS: Cardiac silhouette and mediastinal contours are unchanged and within normal limits. No significant change in bilateral mid and lower lung heterogeneous airspace opacities and small right-greater-than-left pleural effusions. No pneumothorax is seen. Mild multilevel degenerative disc changes of the thoracic spine. IMPRESSION:: IMPRESSION: 1. No significant change from 02/21/2021. 2. Right-greater-than-left heterogeneous airspace opacities and small right-greater-than-left pleural effusions. Electronically Signed   By: Yvonne Kendall M.D.   On: 05/20/2021 14:38        Scheduled Meds:  allopurinol  100 mg Oral Daily   aspirin EC       calcium carbonate  1 tablet Oral BID WC   enoxaparin (LOVENOX) injection  30 mg Subcutaneous Q24H   feeding supplement (NEPRO CARB STEADY)  237 mL Oral BID BM   fidaxomicin  200 mg Oral BID   macitentan  10 mg Oral Daily   multivitamin with minerals  1 tablet Oral Daily   nicotine  14 mg Transdermal Daily   pantoprazole  40 mg Oral Daily   rosuvastatin  10 mg Oral Daily   sodium bicarbonate  1,300 mg Oral TID AC & HS   sodium chloride flush  3 mL Intravenous Q12H   Continuous Infusions:  dextrose 5% lactated ringers 50 mL/hr at 05/20/21 1013     LOS: 2 days    Time spent: 35 minutes    Yazmine Sorey A Robin Pafford, MD [facility]   If 7PM-7AM, please contact night-coverage [email or web address]  05/20/2021, 3:04 PM

## 2021-05-20 NOTE — TOC Progression Note (Signed)
Transition of Care [facility]) - Progression Note    Patient Details  Name: [name] MRN: [id number] Date of Birth: [birth year]  Transition of Care [facility]) CM/SW Contact  Tom-Johnson, Renea Ee, RN Phone Number: 05/20/2021, 10:22 AM  Clinical Narrative:     CM notified by MD to verify that patient is able to pay for his Difficid medication sent to [place]. CM called TOC pharmacy and was told that patient's copay is $62 and that patient's wife will be able to pay for it. Medication will be locked in [place] if patient should be discharged over the weekend, RN could pick up from there. CM will continue to follow with needs.       Barriers to Discharge: Continued Medical Work up  Expected Discharge Plan and Services     Discharge Planning Services: CM Consult   Living arrangements for the past 2 months: Single Family Home                                       Social Determinants of Health (SDOH) Interventions    Readmission Risk Interventions Readmission Risk Prevention Plan 11/10/2019 02/11/2019  Transportation Screening Complete Complete  PCP or Specialist Appt within 3-5 Days Complete Complete  HRI or [place] Complete Complete  Social Work Consult for [place] Planning/Counseling Complete Complete  Palliative Care Screening Not Applicable Not Applicable  Medication Review [employer] photographer) Complete Complete  Some recent data might be hidden

## 2021-05-21 ENCOUNTER — Encounter (HOSPITAL_COMMUNITY): Payer: Self-pay | Admitting: Internal Medicine

## 2021-05-21 DIAGNOSIS — E872 Acidosis, unspecified: Secondary | ICD-10-CM | POA: Diagnosis not present

## 2021-05-21 LAB — CBC
HCT: 26.4 % — ABNORMAL LOW (ref 39.0–52.0)
Hemoglobin: 8.4 g/dL — ABNORMAL LOW (ref 13.0–17.0)
MCH: 29.5 pg (ref 26.0–34.0)
MCHC: 31.8 g/dL (ref 30.0–36.0)
MCV: 92.6 fL (ref 80.0–100.0)
Platelets: 145 10*3/uL — ABNORMAL LOW (ref 150–400)
RBC: 2.85 MIL/uL — ABNORMAL LOW (ref 4.22–5.81)
RDW: 18.1 % — ABNORMAL HIGH (ref 11.5–15.5)
WBC: 8.4 10*3/uL (ref 4.0–10.5)
nRBC: 0 % (ref 0.0–0.2)

## 2021-05-21 LAB — GLUCOSE, CAPILLARY
Glucose-Capillary: 102 mg/dL — ABNORMAL HIGH (ref 70–99)
Glucose-Capillary: 111 mg/dL — ABNORMAL HIGH (ref 70–99)
Glucose-Capillary: 124 mg/dL — ABNORMAL HIGH (ref 70–99)
Glucose-Capillary: 74 mg/dL (ref 70–99)
Glucose-Capillary: 84 mg/dL (ref 70–99)
Glucose-Capillary: 92 mg/dL (ref 70–99)

## 2021-05-21 LAB — BASIC METABOLIC PANEL
Anion gap: 10 (ref 5–15)
BUN: 56 mg/dL — ABNORMAL HIGH (ref 8–23)
CO2: 14 mmol/L — ABNORMAL LOW (ref 22–32)
Calcium: 6.9 mg/dL — ABNORMAL LOW (ref 8.9–10.3)
Chloride: 117 mmol/L — ABNORMAL HIGH (ref 98–111)
Creatinine, Ser: 4.51 mg/dL — ABNORMAL HIGH (ref 0.61–1.24)
GFR, Estimated: 14 mL/min — ABNORMAL LOW (ref >60)
Glucose, Bld: 110 mg/dL — ABNORMAL HIGH (ref 70–99)
Potassium: 2.8 mmol/L — ABNORMAL LOW (ref 3.5–5.1)
Sodium: 141 mmol/L (ref 135–145)

## 2021-05-21 MED ORDER — ACETAMINOPHEN 325 MG PO TABS
650.0000 mg | ORAL_TABLET | Freq: Once | ORAL | Status: AC
  Administered 2021-05-21: 650 mg via ORAL
  Filled 2021-05-21: qty 2

## 2021-05-21 MED ORDER — POTASSIUM CHLORIDE CRYS ER 20 MEQ PO TBCR
40.0000 meq | EXTENDED_RELEASE_TABLET | ORAL | Status: AC
  Administered 2021-05-21 (×2): 40 meq via ORAL
  Filled 2021-05-21 (×2): qty 2

## 2021-05-21 NOTE — Progress Notes (Signed)
PROGRESS NOTE    [name]  MBT:[id number] DOB: May 08, 1960 DOA: 05/16/2021 PCP: [name], MD   Brief Narrative: 61 year old past medical history significant for chronic diastolic heart failure, stage IV CKD, CAD, hypertension, CAD, scleroderma, pericardial effusion status post window(2010, 2011), hypothyroidism who presents with 1 week history of diarrhea.  Denies recent.  No nausea or vomiting.  Multiple episode of bowel movement more than 10.  Patient admitted with C. difficile colitis, AKI on CKD stage IV, metabolic acidosis.   Assessment & Plan:   Principal Problem:   Metabolic acidosis with increased anion gap and accumulation of organic acids Active Problems:   Smoker   Hyperlipidemia   Essential hypertension   Pulmonary hypertension (HCC)   Acute renal failure superimposed on stage 4 chronic kidney disease (HCC)   Diarrhea  1-Metabolic acidosis with increased anion gap: -Multifactorial related to diarrhea and CKD. - Received Bicarb Gtt. Continue with oral Bicarb table. -Continue with IV fluids, decreased rate.  Stable Bicarb 14.   2-C. difficile Colitis: Diarrhea Presented with 1 week history of diarrhea.  C. difficile positive. Started on Fidaxomicin. Day 4 of treatment.  WBC trending down 8 He had 4 BM yesterday. One today so far. Still watery, had low grade fever. ID consulted.   3-AKI on CKD stage IV: -Prior creatinine range 3.5--4.2 -Creatinine peaked 6.5 mission. Down to 5.  -Suspect related to hypovolemia in the setting of diarrheal illness -Plan to continue with IV fluids. -Renal US; Both kidneys demonstrate increased echogenicity consistent with chronic kidney disease. No evidence of hydronephrosis bilaterally. -cr down to 4.5 improved, getting to baseline.  -Urine out put 900 CC.    4-Hypokalemia: Replace with 40 meq time 2   5-pulmonary hypertension: Continue with macitentan,   6-Hypertension: As needed  hydralazine  7-Hyperlipidemia: Continue with Crestor  8-Current smoker: Encourage smoking cessation. Nicotine patch.   Anemia: Suspect  anemia of chronic kidney disease: Follow hemoglobin trend. Hb stable 8.3 Mild hyponatremia: resolved   Hypocalcemia; correction by albumin 7.4. started on calcium supplement.   Hypoglycemia; started on D 5 IV fluids.  Resolved  Cough;  Check chest x ray today and is stable from 01/2021. Incentive spirometry ordered.   Estimated body mass index is 16.83 kg/m as calculated from the following:   Height as of this encounter: _0  (1.753 m).   Weight as of 03/15/21: 51.7 kg.   DVT prophylaxis: Lovenox Code Status: Full code Family Communication: care discussed with wife.  Disposition Plan:  Status is: Observation  The patient will require care spanning > 2 midnights and should be moved to inpatient because: Admitted with acute renal failure on chronic, metabolic acidosis, C. difficile colitis.       Consultants:  None  Procedures:  Renal US>   Antimicrobials:  Dificid   Subjective: He is getting frustrated, still having Diarrhea, watery. Denies abdominal pain.  He had low grade fever, per wife when they check his tempeture it was different once high the other time low.   Objective: Vitals:   05/20/21 1721 05/20/21 2109 05/21/21 0510 05/21/21 0904  BP: 135/75 (!) 141/81 114/69 116/66  Pulse: 96 74 (!) 112 (!) 110  Resp: _1 Temp: 98.1 F (36.7 C) 99.3 F (37.4 C) 100 F (37.8 C) 99.5 F (37.5 C)  TempSrc: Oral Oral Oral Oral  SpO2:  90% 97% 99%  Height:        Intake/Output Summary (Last 24 hours) at 05/21/2021 0936 Last  data filed at 05/21/2021 0601 Gross per 24 hour  Intake 2504.14 ml  Output --  Net 2504.14 ml    There were no vitals filed for this visit.  Examination:  General exam: NAD Respiratory system: CTA Cardiovascular system: S 1, S 2 RRR Gastrointestinal system: BS present, soft, nt Central  nervous system: Non focal.  Extremities: No edema    Data Reviewed: I have personally reviewed following labs and imaging studies  CBC: Recent Labs  Lab 05/16/21 1641 05/18/21 0210 05/19/21 0236 05/20/21 0056 05/21/21 0717  WBC 14.8* 13.3* 14.2* 12.7* 8.4  HGB 9.6* 8.3* 8.1* 8.3* 8.4*  HCT 31.4* 25.8* 24.4* 24.7* 26.4*  MCV 97.5 93.1 92.8 90.8 92.6  PLT 232 196 174 150 145*    Basic Metabolic Panel: Recent Labs  Lab 05/19/21 0236 05/19/21 1042 05/19/21 1855 05/20/21 0056 05/21/21 0149  NA 137 136 138 138 141  K 3.0* 2.8* 3.4* 3.7 2.8*  CL 114* 112* 115* 116* 117*  CO2 9* 13* 14* 12* 14*  GLUCOSE 56* 190* 155* 104* 110*  BUN 86* 83* 73* 70* 56*  CREATININE 5.30* 5.53* 5.04* 4.69* 4.51*  CALCIUM 6.3* 6.3* 6.3* 6.9* 6.9*    GFR: CrCl cannot be calculated (Unknown ideal weight.). Liver Function Tests: Recent Labs  Lab 05/16/21 1641 05/18/21 0210  AST 23 28  ALT 16 19  ALKPHOS 82 73  BILITOT 0.6 0.5  PROT 6.8 5.9*  ALBUMIN 2.9* 2.4*    Recent Labs  Lab 05/16/21 1641  LIPASE 23    No results for input(s): AMMONIA in the last 168 hours. Coagulation Profile: No results for input(s): INR, PROTIME in the last 168 hours. Cardiac Enzymes: Recent Labs  Lab 05/17/21 0926  CKTOTAL 307    BNP (last 3 results) No results for input(s): PROBNP in the last 8760 hours. HbA1C: No results for input(s): HGBA1C in the last 72 hours. CBG: Recent Labs  Lab 05/20/21 1811 05/20/21 2112 05/21/21 0058 05/21/21 0507 05/21/21 0850  GLUCAP 114* 71 124* 102* 111*    Lipid Profile: No results for input(s): CHOL, HDL, LDLCALC, TRIG, CHOLHDL, LDLDIRECT in the last 72 hours. Thyroid Function Tests: No results for input(s): TSH, T4TOTAL, FREET4, T3FREE, THYROIDAB in the last 72 hours. Anemia Panel: No results for input(s): VITAMINB12, FOLATE, FERRITIN, TIBC, IRON, RETICCTPCT in the last 72 hours. Sepsis Labs: Recent Labs  Lab 05/17/21 1200  LATICACIDVEN 1.8      Recent Results (from the past 240 hour(s))  C Difficile Quick Screen w PCR reflex     Status: Abnormal   Collection Time: 05/17/21  9:43 AM   Specimen: STOOL  Result Value Ref Range Status   C Diff antigen POSITIVE (A) NEGATIVE Final    Comment: CRITICAL RESULT CALLED TO, READ BACK BY AND VERIFIED WITH: RN Ardeth Perfect  [id number] _0  FH    C Diff toxin POSITIVE (A) NEGATIVE Final   C Diff interpretation Toxin producing C. difficile detected.  Final    Comment: Performed at [facility], [place] [address], [place], [place] [address]  Gastrointestinal Panel by PCR , Stool     Status: None   Collection Time: 05/17/21  9:43 AM   Specimen: STOOL  Result Value Ref Range Status   Campylobacter species NOT DETECTED NOT DETECTED Final   Plesimonas shigelloides NOT DETECTED NOT DETECTED Final   Salmonella species NOT DETECTED NOT DETECTED Final   Yersinia enterocolitica NOT DETECTED NOT DETECTED Final   Vibrio species NOT DETECTED  NOT DETECTED Final   Vibrio cholerae NOT DETECTED NOT DETECTED Final   Enteroaggregative E coli (EAEC) NOT DETECTED NOT DETECTED Final   Enteropathogenic E coli (EPEC) NOT DETECTED NOT DETECTED Final   Enterotoxigenic E coli (ETEC) NOT DETECTED NOT DETECTED Final   Shiga like toxin producing E coli (STEC) NOT DETECTED NOT DETECTED Final   Shigella/Enteroinvasive E coli (EIEC) NOT DETECTED NOT DETECTED Final   Cryptosporidium NOT DETECTED NOT DETECTED Final   Cyclospora cayetanensis NOT DETECTED NOT DETECTED Final   Entamoeba histolytica NOT DETECTED NOT DETECTED Final   Giardia lamblia NOT DETECTED NOT DETECTED Final   Adenovirus F40/41 NOT DETECTED NOT DETECTED Final   Astrovirus NOT DETECTED NOT DETECTED Final   Norovirus GI/GII NOT DETECTED NOT DETECTED Final   Rotavirus A NOT DETECTED NOT DETECTED Final   Sapovirus (I, II, IV, and V) NOT DETECTED NOT DETECTED Final    Comment: Performed at [facility], [address] [place], [place]  [address]  C. Diff by PCR, Reflexed     Status: Abnormal   Collection Time: 05/17/21  9:43 AM  Result Value Ref Range Status   Toxigenic C. Difficile by PCR POSITIVE (A) NEGATIVE Final    Comment: Positive for toxigenic C. difficile with little to no toxin production. Only treat if clinical presentation suggests symptomatic illness. Performed at [facility], [place] [address], [place], [place] [address]   Resp Panel by RT-PCR (Flu A&B, Covid) Nasopharyngeal Swab     Status: None   Collection Time: 05/17/21 11:22 AM   Specimen: Nasopharyngeal Swab; Nasopharyngeal(NP) swabs in vial transport medium  Result Value Ref Range Status   SARS Coronavirus 2 by RT PCR NEGATIVE NEGATIVE Final    Comment: (NOTE) SARS-CoV-2 target nucleic acids are NOT DETECTED.  The SARS-CoV-2 RNA is generally detectable in upper respiratory specimens during the acute phase of infection. The lowest concentration of SARS-CoV-2 viral copies this assay can detect is 138 copies/mL. A negative result does not preclude SARS-Cov-2 infection and should not be used as the sole basis for treatment or other patient management decisions. A negative result may occur with  improper specimen collection/handling, submission of specimen other than nasopharyngeal swab, presence of viral mutation(s) within the areas targeted by this assay, and inadequate number of viral copies(<138 copies/mL). A negative result must be combined with clinical observations, patient history, and epidemiological information. The expected result is Negative.  Fact Sheet for Patients:  EntrepreneurPulse.com.au  Fact Sheet for Healthcare Providers:  IncredibleEmployment.be  This test is no t yet approved or cleared by the [place] FDA and  has been authorized for detection and/or diagnosis of SARS-CoV-2 by FDA under an Emergency Use Authorization (EUA). This EUA will remain  in effect (meaning this test can be  used) for the duration of the COVID-19 declaration under Section 564(b)(1) of the Act, 21 U.S.C.section 360bbb-3(b)(1), unless the authorization is terminated  or revoked sooner.       Influenza A by PCR NEGATIVE NEGATIVE Final   Influenza B by PCR NEGATIVE NEGATIVE Final    Comment: (NOTE) The Xpert Xpress SARS-CoV-2/FLU/RSV plus assay is intended as an aid in the diagnosis of influenza from Nasopharyngeal swab specimens and should not be used as a sole basis for treatment. Nasal washings and aspirates are unacceptable for Xpert Xpress SARS-CoV-2/FLU/RSV testing.  Fact Sheet for Patients: EntrepreneurPulse.com.au  Fact Sheet for Healthcare Providers: IncredibleEmployment.be  This test is not yet approved or cleared by the [place] FDA  and has been authorized for detection and/or diagnosis of SARS-CoV-2 by FDA under an Emergency Use Authorization (EUA). This EUA will remain in effect (meaning this test can be used) for the duration of the COVID-19 declaration under Section 564(b)(1) of the Act, 21 U.S.C. section 360bbb-3(b)(1), unless the authorization is terminated or revoked.  Performed at [facility], [place] [address], [place], [place] [address]           Radiology Studies: DG CHEST PORT 1 VIEW  Result Date: 05/20/2021 CLINICAL DATA:  Cough EXAM: PORTABLE CHEST 1 VIEW COMPARISON:  02/21/2021 FINDINGS: Cardiac silhouette and mediastinal contours are unchanged and within normal limits. No significant change in bilateral mid and lower lung heterogeneous airspace opacities and small right-greater-than-left pleural effusions. No pneumothorax is seen. Mild multilevel degenerative disc changes of the thoracic spine. IMPRESSION:: IMPRESSION: 1. No significant change from 02/21/2021. 2. Right-greater-than-left heterogeneous airspace opacities and small right-greater-than-left pleural effusions. Electronically Signed   By: Yvonne Kendall  M.D.   On: 05/20/2021 14:38        Scheduled Meds:  allopurinol  100 mg Oral Daily   calcium carbonate  1 tablet Oral BID WC   enoxaparin (LOVENOX) injection  30 mg Subcutaneous Q24H   feeding supplement (NEPRO CARB STEADY)  237 mL Oral BID BM   fidaxomicin  200 mg Oral BID   macitentan  10 mg Oral Daily   multivitamin with minerals  1 tablet Oral Daily   nicotine  14 mg Transdermal Daily   pantoprazole  40 mg Oral Daily   potassium chloride  40 mEq Oral Q4H   rosuvastatin  10 mg Oral Daily   sodium bicarbonate  1,300 mg Oral TID AC & HS   sodium chloride flush  3 mL Intravenous Q12H   Continuous Infusions:  dextrose 5% lactated ringers 75 mL/hr at 05/21/21 0608     LOS: 3 days    Time spent: 35 minutes    Leotha Westermeyer A Asante Ritacco, MD [facility]   If 7PM-7AM, please contact night-coverage [email or web address]  05/21/2021, 9:36 AM

## 2021-05-21 NOTE — Progress Notes (Signed)
°   05/21/21 0510  Assess: MEWS Score  Temp 100 F (37.8 C)  BP 114/69  Pulse Rate (!) 112  Resp 18  SpO2 97 %  O2 Device Room Air  Assess: MEWS Score  MEWS Temp 0  MEWS Systolic 0  MEWS Pulse 2  MEWS RR 0  MEWS LOC 0  MEWS Score 2  MEWS Score Color Yellow  Assess: if the MEWS score is Yellow or Red  Were vital signs taken at a resting state? Yes  Focused Assessment No change from prior assessment  Early Detection of Sepsis Score *See Row Information* Low  MEWS guidelines implemented *See Row Information* Yes  Take Vital Signs  Increase Vital Sign Frequency  Yellow: Q 2hr X 2 then Q 4hr X 2, if remains yellow, continue Q 4hrs  Escalate  MEWS: Escalate Yellow: discuss with charge nurse/RN and consider discussing with provider and RRT  Notify: Charge Nurse/RN  Name of Charge Nurse/RN Notified Charito B., RN  Date Charge Nurse/RN Notified 05/21/21  Time Charge Nurse/RN Notified 0520  Notify: Provider  Provider Name/Title B. Choitner, MD  Date Provider Notified 05/21/21  Time Provider Notified [phone]  Notification Type Page  Notification Reason Change in status  Provider response No new orders  Date of Provider Response 05/21/21  Time of Provider Response [phone]  Notify: Rapid Response  Name of Rapid Response RN Notified N/A  Document  Patient Outcome Other (Comment) (Patient currently stable and no intervention at this time.)  Progress note created (see row info) Yes

## 2021-05-21 NOTE — Plan of Care (Signed)
Problem: Nutrition: Goal: Adequate nutrition will be maintained Outcome: Progressing   

## 2021-05-22 DIAGNOSIS — E872 Acidosis, unspecified: Secondary | ICD-10-CM | POA: Diagnosis not present

## 2021-05-22 LAB — BASIC METABOLIC PANEL
Anion gap: 6 (ref 5–15)
BUN: 49 mg/dL — ABNORMAL HIGH (ref 8–23)
CO2: 17 mmol/L — ABNORMAL LOW (ref 22–32)
Calcium: 6.6 mg/dL — ABNORMAL LOW (ref 8.9–10.3)
Chloride: 119 mmol/L — ABNORMAL HIGH (ref 98–111)
Creatinine, Ser: 4.26 mg/dL — ABNORMAL HIGH (ref 0.61–1.24)
GFR, Estimated: 15 mL/min — ABNORMAL LOW (ref >60)
Glucose, Bld: 97 mg/dL (ref 70–99)
Potassium: 3 mmol/L — ABNORMAL LOW (ref 3.5–5.1)
Sodium: 142 mmol/L (ref 135–145)

## 2021-05-22 LAB — GLUCOSE, CAPILLARY
Glucose-Capillary: 69 mg/dL — ABNORMAL LOW (ref 70–99)
Glucose-Capillary: 83 mg/dL (ref 70–99)
Glucose-Capillary: 112 mg/dL — ABNORMAL HIGH (ref 70–99)
Glucose-Capillary: 40 mg/dL — CL (ref 70–99)
Glucose-Capillary: 81 mg/dL (ref 70–99)

## 2021-05-22 LAB — GLUCOSE, RANDOM: Glucose, Bld: 127 mg/dL — ABNORMAL HIGH (ref 70–99)

## 2021-05-22 MED ORDER — DEXTROSE 50 % IV SOLN
INTRAVENOUS | Status: AC
  Filled 2021-05-22: qty 50

## 2021-05-22 MED ORDER — POTASSIUM CHLORIDE CRYS ER 20 MEQ PO TBCR
40.0000 meq | EXTENDED_RELEASE_TABLET | ORAL | Status: AC
  Administered 2021-05-22 (×2): 40 meq via ORAL
  Filled 2021-05-22 (×2): qty 2

## 2021-05-22 NOTE — Progress Notes (Signed)
PROGRESS NOTE    [name]  DCV:[id number] DOB: 16-Aug-1960 DOA: 05/16/2021 PCP: [name], MD   Brief Narrative: 61 year old past medical history significant for chronic diastolic heart failure, stage IV CKD, CAD, hypertension, CAD, scleroderma, pericardial effusion status post window(2010, 2011), hypothyroidism who presents with 1 week history of diarrhea.  Denies recent.  No nausea or vomiting.  Multiple episode of bowel movement more than 10.  Patient admitted with C. difficile colitis, AKI on CKD stage IV, metabolic acidosis.   Assessment & Plan:   Principal Problem:   Metabolic acidosis with increased anion gap and accumulation of organic acids Active Problems:   Smoker   Hyperlipidemia   Essential hypertension   Pulmonary hypertension (HCC)   Acute renal failure superimposed on stage 4 chronic kidney disease (HCC)   Diarrhea  1-Metabolic acidosis with increased anion gap: -Multifactorial related to diarrhea and CKD. - Received Bicarb Gtt. Continue with oral Bicarb table. -Continue with IV fluids, decreased rate.  -Bicarb increase to 17. Improved   2-C. difficile Colitis: Diarrhea Presented with 1 week history of diarrhea.  C. difficile positive. Started on Fidaxomicin. Day 5 of treatment.  WBC trending down 8 Had 2 BM overnight and one this am.  Continue with current treatment.   3-AKI on CKD stage IV: -Prior Creatinine Range 3.5--4.2 -Creatinine peaked 6.5 mission. Down to 4.2 -Suspect related to hypovolemia in the setting of diarrheal illness -Renal US; Both kidneys demonstrate increased echogenicity consistent with chronic kidney disease. No evidence of hydronephrosis bilaterally. -Renal function stable.   4-Hypokalemia: Replace with 40 meq time 2   5-Pulmonary Hypertension: Continue with macitentan,   6-Hypertension: As needed hydralazine  7-Hyperlipidemia: Continue with Crestor  8-Current smoker: Encourage smoking cessation. Nicotine  patch.   Anemia: Suspect  anemia of chronic kidney disease: Follow hemoglobin trend. Hb stable 8.3 Mild hyponatremia: resolved   Hypocalcemia; Correction by albumin 7.4. started on calcium supplement.   Hypoglycemia; started on D 5 IV fluids.  Resolved.   Cough;  Check chest x ray today and is stable from 01/2021. Incentive spirometry ordered.   Estimated body mass index is 16.83 kg/m as calculated from the following:   Height as of this encounter: _0  (1.753 m).   Weight as of 03/15/21: 51.7 kg.   DVT prophylaxis: Lovenox Code Status: Full code Family Communication: care discussed with wife.  Disposition Plan:  Status is: Observation  The patient will require care spanning > 2 midnights and should be moved to inpatient because: Admitted with acute renal failure on chronic, metabolic acidosis, C. difficile colitis.       Consultants:  None  Procedures:  Renal US>   Antimicrobials:  Dificid   Subjective: He is feeling ok, had one episode watery stool today.  No abdominal pain.   Objective: Vitals:   05/21/21 1620 05/21/21 2118 05/22/21 0611 05/22/21 0954  BP: 109/68 128/76 119/69 125/78  Pulse: 96 94 (!) 105 100  Resp: _1 Temp: 97.7 F (36.5 C) 98 F (36.7 C) 99.3 F (37.4 C) 98.2 F (36.8 C)  TempSrc:  Oral Oral Oral  SpO2: 97% 100% 98% 98%  Height:        Intake/Output Summary (Last 24 hours) at 05/22/2021 1412 Last data filed at 05/22/2021 0900 Gross per 24 hour  Intake 1247.16 ml  Output 1550 ml  Net -302.84 ml    There were no vitals filed for this visit.  Examination:  General exam: NAD Respiratory system: CTA  Cardiovascular system: S 1, S 2 RRR Gastrointestinal system: BS present, soft, nt Central nervous system: Non focal.  Extremities: No edema    Data Reviewed: I have personally reviewed following labs and imaging studies  CBC: Recent Labs  Lab 05/16/21 1641 05/18/21 0210 05/19/21 0236 05/20/21 0056  05/21/21 0717  WBC 14.8* 13.3* 14.2* 12.7* 8.4  HGB 9.6* 8.3* 8.1* 8.3* 8.4*  HCT 31.4* 25.8* 24.4* 24.7* 26.4*  MCV 97.5 93.1 92.8 90.8 92.6  PLT 232 196 174 150 145*    Basic Metabolic Panel: Recent Labs  Lab 05/19/21 1042 05/19/21 1855 05/20/21 0056 05/21/21 0149 05/22/21 0408  NA 136 138 138 141 142  K 2.8* 3.4* 3.7 2.8* 3.0*  CL 112* 115* 116* 117* 119*  CO2 13* 14* 12* 14* 17*  GLUCOSE 190* 155* 104* 110* 97  BUN 83* 73* 70* 56* 49*  CREATININE 5.53* 5.04* 4.69* 4.51* 4.26*  CALCIUM 6.3* 6.3* 6.9* 6.9* 6.6*    GFR: CrCl cannot be calculated (Unknown ideal weight.). Liver Function Tests: Recent Labs  Lab 05/16/21 1641 05/18/21 0210  AST 23 28  ALT 16 19  ALKPHOS 82 73  BILITOT 0.6 0.5  PROT 6.8 5.9*  ALBUMIN 2.9* 2.4*    Recent Labs  Lab 05/16/21 1641  LIPASE 23    No results for input(s): AMMONIA in the last 168 hours. Coagulation Profile: No results for input(s): INR, PROTIME in the last 168 hours. Cardiac Enzymes: Recent Labs  Lab 05/17/21 0926  CKTOTAL 307    BNP (last 3 results) No results for input(s): PROBNP in the last 8760 hours. HbA1C: No results for input(s): HGBA1C in the last 72 hours. CBG: Recent Labs  Lab 05/21/21 1616 05/21/21 2122 05/22/21 0747 05/22/21 0849 05/22/21 1138  GLUCAP 84 74 69* 83 112*    Lipid Profile: No results for input(s): CHOL, HDL, LDLCALC, TRIG, CHOLHDL, LDLDIRECT in the last 72 hours. Thyroid Function Tests: No results for input(s): TSH, T4TOTAL, FREET4, T3FREE, THYROIDAB in the last 72 hours. Anemia Panel: No results for input(s): VITAMINB12, FOLATE, FERRITIN, TIBC, IRON, RETICCTPCT in the last 72 hours. Sepsis Labs: Recent Labs  Lab 05/17/21 1200  LATICACIDVEN 1.8     Recent Results (from the past 240 hour(s))  C Difficile Quick Screen w PCR reflex     Status: Abnormal   Collection Time: 05/17/21  9:43 AM   Specimen: STOOL  Result Value Ref Range Status   C Diff antigen POSITIVE  (A) NEGATIVE Final    Comment: CRITICAL RESULT CALLED TO, READ BACK BY AND VERIFIED WITH: RN Ardeth Perfect  [id number] _0  FH    C Diff toxin POSITIVE (A) NEGATIVE Final   C Diff interpretation Toxin producing C. difficile detected.  Final    Comment: Performed at [facility], [place] [address] [place], [place] [address]  Gastrointestinal Panel by PCR , Stool     Status: None   Collection Time: 05/17/21  9:43 AM   Specimen: STOOL  Result Value Ref Range Status   Campylobacter species NOT DETECTED NOT DETECTED Final   Plesimonas shigelloides NOT DETECTED NOT DETECTED Final   Salmonella species NOT DETECTED NOT DETECTED Final   Yersinia enterocolitica NOT DETECTED NOT DETECTED Final   Vibrio species NOT DETECTED NOT DETECTED Final   Vibrio cholerae NOT DETECTED NOT DETECTED Final   Enteroaggregative E coli (EAEC) NOT DETECTED NOT DETECTED Final   Enteropathogenic E coli (EPEC) NOT DETECTED NOT DETECTED Final   Enterotoxigenic E coli (ETEC)  NOT DETECTED NOT DETECTED Final   Shiga like toxin producing E coli (STEC) NOT DETECTED NOT DETECTED Final   Shigella/Enteroinvasive E coli (EIEC) NOT DETECTED NOT DETECTED Final   Cryptosporidium NOT DETECTED NOT DETECTED Final   Cyclospora cayetanensis NOT DETECTED NOT DETECTED Final   Entamoeba histolytica NOT DETECTED NOT DETECTED Final   Giardia lamblia NOT DETECTED NOT DETECTED Final   Adenovirus F40/41 NOT DETECTED NOT DETECTED Final   Astrovirus NOT DETECTED NOT DETECTED Final   Norovirus GI/GII NOT DETECTED NOT DETECTED Final   Rotavirus A NOT DETECTED NOT DETECTED Final   Sapovirus (I, II, IV, and V) NOT DETECTED NOT DETECTED Final    Comment: Performed at [facility], [address], [place], [place] [address]  C. Diff by PCR, Reflexed     Status: Abnormal   Collection Time: 05/17/21  9:43 AM  Result Value Ref Range Status   Toxigenic C. Difficile by PCR POSITIVE (A) NEGATIVE Final    Comment: Positive for toxigenic C.  difficile with little to no toxin production. Only treat if clinical presentation suggests symptomatic illness. Performed at [facility], [place] [address], [place], [place] [address]   Resp Panel by RT-PCR (Flu A&B, Covid) Nasopharyngeal Swab     Status: None   Collection Time: 05/17/21 11:22 AM   Specimen: Nasopharyngeal Swab; Nasopharyngeal(NP) swabs in vial transport medium  Result Value Ref Range Status   SARS Coronavirus 2 by RT PCR NEGATIVE NEGATIVE Final    Comment: (NOTE) SARS-CoV-2 target nucleic acids are NOT DETECTED.  The SARS-CoV-2 RNA is generally detectable in upper respiratory specimens during the acute phase of infection. The lowest concentration of SARS-CoV-2 viral copies this assay can detect is 138 copies/mL. A negative result does not preclude SARS-Cov-2 infection and should not be used as the sole basis for treatment or other patient management decisions. A negative result may occur with  improper specimen collection/handling, submission of specimen other than nasopharyngeal swab, presence of viral mutation(s) within the areas targeted by this assay, and inadequate number of viral copies(<138 copies/mL). A negative result must be combined with clinical observations, patient history, and epidemiological information. The expected result is Negative.  Fact Sheet for Patients:  EntrepreneurPulse.com.au  Fact Sheet for Healthcare Providers:  IncredibleEmployment.be  This test is no t yet approved or cleared by the [place] FDA and  has been authorized for detection and/or diagnosis of SARS-CoV-2 by FDA under an Emergency Use Authorization (EUA). This EUA will remain  in effect (meaning this test can be used) for the duration of the COVID-19 declaration under Section 564(b)(1) of the Act, 21 U.S.C.section 360bbb-3(b)(1), unless the authorization is terminated  or revoked sooner.       Influenza A by PCR NEGATIVE  NEGATIVE Final   Influenza B by PCR NEGATIVE NEGATIVE Final    Comment: (NOTE) The Xpert Xpress SARS-CoV-2/FLU/RSV plus assay is intended as an aid in the diagnosis of influenza from Nasopharyngeal swab specimens and should not be used as a sole basis for treatment. Nasal washings and aspirates are unacceptable for Xpert Xpress SARS-CoV-2/FLU/RSV testing.  Fact Sheet for Patients: EntrepreneurPulse.com.au  Fact Sheet for Healthcare Providers: IncredibleEmployment.be  This test is not yet approved or cleared by the [place] FDA and has been authorized for detection and/or diagnosis of SARS-CoV-2 by FDA under an Emergency Use Authorization (EUA). This EUA will remain in effect (meaning this test can be used) for the duration of the COVID-19 declaration under Section 564(b)(1)  of the Act, 21 U.S.C. section 360bbb-3(b)(1), unless the authorization is terminated or revoked.  Performed at [facility], [place] [address], [place], [place] [address]           Radiology Studies: No results found.      Scheduled Meds:  allopurinol  100 mg Oral Daily   calcium carbonate  1 tablet Oral BID WC   dextrose       enoxaparin (LOVENOX) injection  30 mg Subcutaneous Q24H   feeding supplement (NEPRO CARB STEADY)  237 mL Oral BID BM   fidaxomicin  200 mg Oral BID   macitentan  10 mg Oral Daily   multivitamin with minerals  1 tablet Oral Daily   nicotine  14 mg Transdermal Daily   pantoprazole  40 mg Oral Daily   rosuvastatin  10 mg Oral Daily   sodium bicarbonate  1,300 mg Oral TID AC & HS   sodium chloride flush  3 mL Intravenous Q12H   Continuous Infusions:  dextrose 5% lactated ringers 50 mL/hr at 05/21/21 2315     LOS: 4 days    Time spent: 35 minutes    Vayden Weinand A Kamarian Sahakian, MD [facility]   If 7PM-7AM, please contact night-coverage [email or web address]  05/22/2021, 2:12 PM

## 2021-05-22 NOTE — Consult Note (Signed)
[facility] for Infectious Disease    Date of Admission:  05/16/2021   Total days of inpatient antibiotics 5        Reason for Consult: C. diff    Principal Problem:   Metabolic acidosis with increased anion gap and accumulation of organic acids Active Problems:   Smoker   Hyperlipidemia   Essential hypertension   Pulmonary hypertension (HCC)   Acute renal failure superimposed on stage 4 chronic kidney disease (HCC)   Diarrhea   Assessment: #C. diff colitis -Pt reports decreased bowel movements.over the last 2 days Leukocytosis resolved. He remains afebrile. I counseled pt and wife at bedside that the clinical picture is improving and continue Dificid therapy.   Recommendations:  -Continue dificid  to complete 10 days of therapy -ID will sing off Microbiology:   Antibiotics: Dificid 1/24-p    HPI: [name] is a 61 y.o. male with CHF, CKD stage IV, CAD, HLD, HTN, scleroderma, pericardial effusion SP window (2010, 2011) admitted for diarrhea with metabolic acidosis. Found to Cdiff colitis, pt started on Dificid. ID engaged for management of cidff.   Review of Systems: Review of Systems  All other systems reviewed and are negative.  Past Medical History:  Diagnosis Date   (HFpEF) heart failure with preserved ejection fraction (HCC)    Anxiety    CAD (coronary artery disease)    CKD (chronic kidney disease), stage IV (HCC)    Gout    Hyperlipidemia    Hypertension 05/14/2012    Lexiscan-- EF 51% ,LV normal   Hypothyroid    MI (myocardial infarction) ([place]) 2010   Pericardial effusion 12/2008   Dr [name] performed a subxiphoid window removing 267m of fluid   Pericardial effusion 03/09/2010   Echo-LVEF >55%, very small pericardial effusion ,,Stage 1 (impaired ) diastolic fxn, elevated LV filling   Pericarditis    Raynaud's phenomenon    [place]    Smoker 09/16/2018   1 ppd   Vitiligo     Social History   Tobacco Use    Smoking status: Every Day    Packs/day: 1.00    Years: 38.00    Pack years: 38.00    Types: Cigarettes    Start date: 10/21/1971   Smokeless tobacco: Never  Vaping Use   Vaping Use: Never used  Substance Use Topics   Alcohol use: Yes    Comment: h/o heavy use, has cut way back   Drug use: No    Family History  Problem Relation Age of Onset   Lupus Mother    Cancer Mother        unknown per wife   Kidney failure Father    Autoimmune disease Sister    Lung disease Daughter    Colon cancer Neg Hx    Scheduled Meds:  allopurinol  100 mg Oral Daily   calcium carbonate  1 tablet Oral BID WC   enoxaparin (LOVENOX) injection  30 mg Subcutaneous Q24H   feeding supplement (NEPRO CARB STEADY)  237 mL Oral BID BM   fidaxomicin  200 mg Oral BID   macitentan  10 mg Oral Daily   multivitamin with minerals  1 tablet Oral Daily   nicotine  14 mg Transdermal Daily   pantoprazole  40 mg Oral Daily   rosuvastatin  10 mg Oral Daily   sodium bicarbonate  1,300 mg Oral TID AC & HS   sodium chloride flush  3 mL  Intravenous Q12H   Continuous Infusions:  dextrose 5% lactated ringers 50 mL/hr at 05/21/21 2315   PRN Meds:.acetaminophen **OR** acetaminophen, albuterol, ALPRAZolam, hydrALAZINE, HYDROcodone-acetaminophen, methocarbamol, morphine injection, ondansetron **OR** ondansetron (ZOFRAN) IV Allergies  Allergen Reactions   Oxycodone Other (See Comments)    Hallucinations    OBJECTIVE: Blood pressure 128/76, pulse 94, temperature 98 F (36.7 C), temperature source Oral, resp. rate 18, height _0  (1.753 m), SpO2 100 %.  Physical Exam Constitutional:      General: He is not in acute distress.    Appearance: He is normal weight. He is not toxic-appearing.  HENT:     Head: Normocephalic and atraumatic.     Right Ear: External ear normal.     Left Ear: External ear normal.     Nose: No congestion or rhinorrhea.     Mouth/Throat:     Mouth: Mucous membranes are moist.     Pharynx:  Oropharynx is clear.  Eyes:     Extraocular Movements: Extraocular movements intact.     Conjunctiva/sclera: Conjunctivae normal.     Pupils: Pupils are equal, round, and reactive to light.  Cardiovascular:     Rate and Rhythm: Normal rate and regular rhythm.     Heart sounds: No murmur heard.   No friction rub. No gallop.  Pulmonary:     Effort: Pulmonary effort is normal.     Breath sounds: Normal breath sounds.  Abdominal:     General: Abdomen is flat. Bowel sounds are normal.     Palpations: Abdomen is soft.  Musculoskeletal:        General: No swelling. Normal range of motion.     Cervical back: Normal range of motion and neck supple.  Skin:    General: Skin is warm and dry.  Neurological:     General: No focal deficit present.     Mental Status: He is oriented to person, place, and time.  Psychiatric:        Mood and Affect: Mood normal.    Lab Results Lab Results  Component Value Date   WBC 8.4 05/21/2021   HGB 8.4 (L) 05/21/2021   HCT 26.4 (L) 05/21/2021   MCV 92.6 05/21/2021   PLT 145 (L) 05/21/2021    Lab Results  Component Value Date   CREATININE 4.51 (H) 05/21/2021   BUN 56 (H) 05/21/2021   NA 141 05/21/2021   K 2.8 (L) 05/21/2021   CL 117 (H) 05/21/2021   CO2 14 (L) 05/21/2021    Lab Results  Component Value Date   ALT 19 05/18/2021   AST 28 05/18/2021   ALKPHOS 73 05/18/2021   BILITOT 0.5 05/18/2021       Laurice Record, [facility] 05/22/2021, 12:49 AM

## 2021-05-23 ENCOUNTER — Encounter (HOSPITAL_COMMUNITY): Payer: Self-pay

## 2021-05-23 ENCOUNTER — Other Ambulatory Visit (HOSPITAL_COMMUNITY): Payer: Self-pay

## 2021-05-23 DIAGNOSIS — E872 Acidosis, unspecified: Secondary | ICD-10-CM | POA: Diagnosis not present

## 2021-05-23 LAB — BASIC METABOLIC PANEL
Anion gap: 7 (ref 5–15)
BUN: 39 mg/dL — ABNORMAL HIGH (ref 8–23)
CO2: 19 mmol/L — ABNORMAL LOW (ref 22–32)
Calcium: 6.7 mg/dL — ABNORMAL LOW (ref 8.9–10.3)
Chloride: 118 mmol/L — ABNORMAL HIGH (ref 98–111)
Creatinine, Ser: 4.04 mg/dL — ABNORMAL HIGH (ref 0.61–1.24)
GFR, Estimated: 16 mL/min — ABNORMAL LOW (ref >60)
Glucose, Bld: 126 mg/dL — ABNORMAL HIGH (ref 70–99)
Potassium: 3.2 mmol/L — ABNORMAL LOW (ref 3.5–5.1)
Sodium: 144 mmol/L (ref 135–145)

## 2021-05-23 LAB — GLUCOSE, CAPILLARY: Glucose-Capillary: 39 mg/dL — CL (ref 70–99)

## 2021-05-23 MED ORDER — SODIUM BICARBONATE 650 MG PO TABS
1300.0000 mg | ORAL_TABLET | Freq: Three times a day (TID) | ORAL | 0 refills | Status: DC
  Filled 2021-05-23: qty 30, 5d supply, fill #0

## 2021-05-23 MED ORDER — POTASSIUM CHLORIDE CRYS ER 20 MEQ PO TBCR: 40.0000 meq | EXTENDED_RELEASE_TABLET | Freq: Once | ORAL | Status: DC

## 2021-05-23 MED ORDER — POTASSIUM CHLORIDE CRYS ER 20 MEQ PO TBCR
20.0000 meq | EXTENDED_RELEASE_TABLET | Freq: Every day | ORAL | 0 refills | Status: DC
  Filled 2021-05-23: qty 10, 10d supply, fill #0

## 2021-05-23 NOTE — Discharge Summary (Signed)
Physician Discharge Summary  [name] KAJ:[id number] DOB: 12/27/1960 DOA: 05/16/2021  PCP: [name], MD  Admit date: 05/16/2021 Discharge date: 05/23/2021  Admitted From: Home  Disposition:  Home   Recommendations for Outpatient Follow-up:  Follow up with PCP in 1-2 weeks Please obtain BMP/CBC in one week Needs Close follow up with Nephrologist   Home Health: None  Discharge Condition: Stable.  CODE STATUS: Full code Diet recommendation: Heart Healthy   Brief/Interim Summary: 61 year old past medical history significant for chronic diastolic heart failure, stage IV CKD, CAD, hypertension, CAD, scleroderma, pericardial effusion status post window(2010, 2011), hypothyroidism who presents with 1 week history of diarrhea.  Denies recent.  No nausea or vomiting.  Multiple episode of bowel movement more than 10.   Patient admitted with C. difficile colitis, AKI on CKD stage IV, metabolic acidosis.  1-Metabolic acidosis with increased anion gap: -Multifactorial related to diarrhea and CKD. - Received Bicarb Gtt. Continue with oral Bicarb table increase to QID.  -Continue with IV fluids, decreased rate.  Improved. Discharge on sodium bicarb.    2-C. difficile Colitis: Diarrhea Presented with 1 week history of diarrhea.  C. difficile positive. Started on Fidaxomicin. Day 5 of treatment. He will complete 5 days at home  WBC normalized.     3-AKI on CKD stage IV: -Prior creatinine range 3.5--4.2 -Creatinine peaked 6.5 mission. Down to 4.2 -Suspect related to hypovolemia in the setting of diarrheal illness -Treated  with IV fluids. -Renal US; Both kidneys demonstrate increased echogenicity consistent with chronic kidney disease. No evidence of hydronephrosis bilaterally. Renal function stable. Metabolic acidosis improved. Discharge on oral Bicarb tablet.  Renal function down to baseline. Resume torsemide at discharge, now diarrhea has resolved.      4-Hypokalemia:  Replaced orally. Resume potasium supplement.    5-pulmonary hypertension: Continue with macitentan.    6-Hypertension: As needed hydralazine   7-Hyperlipidemia: Continue with Crestor   8-Current smoker: Encourage smoking cessation. Nicotine patch.    Anemia: Suspect  anemia of chronic kidney disease: Follow hemoglobin trend. Hb stable 8.3 Mild hyponatremia: Treated with IV fluids   Hypocalcemia; correction by albumin 7.4. started on calcium supplement.    Hypoglycemia; started on D 5 IV fluids.  Resolved  His CBG form Finger likely not accurate due to poor perfusion. CBG finger tip at 40, recheck glucose random was 127.   Cough;  Check chest x ray today and is stable from 01/2021. Incentive spirometry ordered.  Plan to resume torsemide, to help with pleural effusion, PRN cough med  Estimated body mass index is 16.83 kg/m as calculated from the following:   Height as of this encounter: _0  (1.753 m).   Weight as of 03/15/21: 51.7 kg.    Discharge Diagnoses:  Principal Problem:   Metabolic acidosis with increased anion gap and accumulation of organic acids Active Problems:   Smoker   Hyperlipidemia   Essential hypertension   Pulmonary hypertension (HCC)   Acute renal failure superimposed on stage 4 chronic kidney disease ([place])   Diarrhea    Discharge Instructions  Discharge Instructions     Diet - low sodium heart healthy   Complete by: As directed    Increase activity slowly   Complete by: As directed       Allergies as of 05/23/2021       Reactions   Oxycodone Other (See Comments)   Hallucinations        Medication List     TAKE these medications  allopurinol 100 MG tablet Commonly known as: ZYLOPRIM TAKE 1 TABLET BY MOUTH EVERY DAY   ALPRAZolam 0.5 MG tablet Commonly known as: XANAX Take 0.25 mg by mouth 2 (two) times daily as needed for anxiety.   Calcium Antacid 500 MG chewable tablet Generic drug: calcium carbonate Chew 1 tablet  by mouth 2 (two) times daily with a meal.   colchicine 0.6 MG tablet Take 0.6 mg by mouth daily as needed (gout attacks).   Dificid 200 MG Tabs tablet Generic drug: fidaxomicin Take 1 tablet (200 mg total) by mouth 2 (two) times daily for 7 days.   guaiFENesin 600 MG 12 hr tablet Commonly known as: MUCINEX Take 600 mg by mouth 2 (two) times daily as needed for cough.   HYDROcodone-acetaminophen 10-325 MG tablet Commonly known as: NORCO Take 0.5-1 tablets by mouth every 6 (six) hours as needed for moderate pain.   methocarbamol 500 MG tablet Commonly known as: ROBAXIN Take 1 tablet (500 mg total) by mouth 2 (two) times daily as needed for muscle spasms.   multivitamin-iron-minerals-folic acid chewable tablet Chew 1 tablet by mouth daily.   Opsumit 10 MG tablet Generic drug: macitentan Take 1 tablet (10 mg total) by mouth daily.   pantoprazole 40 MG tablet Commonly known as: PROTONIX Take 1 tablet (40 mg total) by mouth daily for 90 doses.   potassium chloride SA 20 MEQ tablet Commonly known as: Klor-Con M20 Take 1 tablet (20 mEq total) by mouth daily.   RETACRIT IJ Inject as directed every 30 (thirty) days.   rosuvastatin 20 MG tablet Commonly known as: CRESTOR Take 20 mg by mouth daily.   sildenafil 20 MG tablet Commonly known as: REVATIO Take 2 tablets (40 mg total) by mouth 3 (three) times daily.   sodium bicarbonate 650 MG tablet Take 2 tablets (1,300 mg total) by mouth 3 (three) times daily.   torsemide 20 MG tablet Commonly known as: DEMADEX Take 20 mg by mouth daily.        Follow-up Information     [name], Ravisankar, MD Follow up in 1 week(s).   Specialty: Internal Medicine Contact information: [place] [address] [phone]         Lorretta Harp, MD .   Specialties: Cardiology, Radiology Contact information: [address] [place] [address] [phone]         Bensimhon, Shaune Pascal, MD .    Specialty: Cardiology Contact information: [place] [address] [phone]                Allergies  Allergen Reactions   Oxycodone Other (See Comments)    Hallucinations    Consultations: ID   Procedures/Studies: US RENAL  Result Date: 05/18/2021 CLINICAL DATA:  Acute kidney injury. EXAM: RENAL / URINARY TRACT ULTRASOUND COMPLETE COMPARISON:  Prior renal ultrasound on 10/03/2019 FINDINGS: Right Kidney: Renal measurements: 9.1 x 4.7 x 4.6 cm = volume: 103 mL. Echogenic cortex without significant atrophy. No evidence of hydronephrosis, focal mass or shadowing calculus. Left Kidney: Renal measurements: 9.2 x 5.6 x 4.9 cm = volume: 133 mL. Increased echogenicity of the renal cortex. No hydronephrosis, focal mass or shadowing calculus. Bladder: Appears normal for degree of bladder distention. Other: None. IMPRESSION: Both kidneys demonstrate increased echogenicity consistent with chronic kidney disease. No evidence of hydronephrosis bilaterally. Electronically Signed   By: Aletta Edouard M.D.   On: 05/18/2021 17:20   DG CHEST PORT 1 VIEW  Result  Date: 05/20/2021 CLINICAL DATA:  Cough EXAM: PORTABLE CHEST 1 VIEW COMPARISON:  02/21/2021 FINDINGS: Cardiac silhouette and mediastinal contours are unchanged and within normal limits. No significant change in bilateral mid and lower lung heterogeneous airspace opacities and small right-greater-than-left pleural effusions. No pneumothorax is seen. Mild multilevel degenerative disc changes of the thoracic spine. IMPRESSION:: IMPRESSION: 1. No significant change from 02/21/2021. 2. Right-greater-than-left heterogeneous airspace opacities and small right-greater-than-left pleural effusions. Electronically Signed   By: Yvonne Kendall M.D.   On: 05/20/2021 14:38     Subjective: He is feeling better. Feels he can go home. Had BM last night. None this am  Discharge Exam: Vitals:   05/22/21 2128 05/23/21 0855   BP: 136/78 133/75  Pulse: 96 93  Resp: 18 17  Temp: 98.6 F (37 C) 98 F (36.7 C)  SpO2: 100% 99%     General: Pt is alert, awake, not in acute distress Cardiovascular: RRR, S1/S2 +, no rubs, no gallops Respiratory: CTA bilaterally, no wheezing, no rhonchi Abdominal: Soft, NT, ND, bowel sounds + Extremities: no edema, no cyanosis    The results of significant diagnostics from this hospitalization (including imaging, microbiology, ancillary and laboratory) are listed below for reference.     Microbiology: Recent Results (from the past 240 hour(s))  C Difficile Quick Screen w PCR reflex     Status: Abnormal   Collection Time: 05/17/21  9:43 AM   Specimen: STOOL  Result Value Ref Range Status   C Diff antigen POSITIVE (A) NEGATIVE Final    Comment: CRITICAL RESULT CALLED TO, READ BACK BY AND VERIFIED WITH: RN Ardeth Perfect  [id number] _0  FH    C Diff toxin POSITIVE (A) NEGATIVE Final   C Diff interpretation Toxin producing C. difficile detected.  Final    Comment: Performed at [facility], [place] [address] [place], [place] [address]  Gastrointestinal Panel by PCR , Stool     Status: None   Collection Time: 05/17/21  9:43 AM   Specimen: STOOL  Result Value Ref Range Status   Campylobacter species NOT DETECTED NOT DETECTED Final   Plesimonas shigelloides NOT DETECTED NOT DETECTED Final   Salmonella species NOT DETECTED NOT DETECTED Final   Yersinia enterocolitica NOT DETECTED NOT DETECTED Final   Vibrio species NOT DETECTED NOT DETECTED Final   Vibrio cholerae NOT DETECTED NOT DETECTED Final   Enteroaggregative E coli (EAEC) NOT DETECTED NOT DETECTED Final   Enteropathogenic E coli (EPEC) NOT DETECTED NOT DETECTED Final   Enterotoxigenic E coli (ETEC) NOT DETECTED NOT DETECTED Final   Shiga like toxin producing E coli (STEC) NOT DETECTED NOT DETECTED Final   Shigella/Enteroinvasive E coli (EIEC) NOT DETECTED NOT DETECTED Final   Cryptosporidium NOT DETECTED NOT  DETECTED Final   Cyclospora cayetanensis NOT DETECTED NOT DETECTED Final   Entamoeba histolytica NOT DETECTED NOT DETECTED Final   Giardia lamblia NOT DETECTED NOT DETECTED Final   Adenovirus F40/41 NOT DETECTED NOT DETECTED Final   Astrovirus NOT DETECTED NOT DETECTED Final   Norovirus GI/GII NOT DETECTED NOT DETECTED Final   Rotavirus A NOT DETECTED NOT DETECTED Final   Sapovirus (I, II, IV, and V) NOT DETECTED NOT DETECTED Final    Comment: Performed at [facility], [address] [place], [place] [address]  C. Diff by PCR, Reflexed     Status: Abnormal   Collection Time: 05/17/21  9:43 AM  Result Value Ref Range Status   Toxigenic C. Difficile by PCR POSITIVE (A) NEGATIVE  Final    Comment: Positive for toxigenic C. difficile with little to no toxin production. Only treat if clinical presentation suggests symptomatic illness. Performed at [facility], [place] [address] [place], [place] [address]   Resp Panel by RT-PCR (Flu A&B, Covid) Nasopharyngeal Swab     Status: None   Collection Time: 05/17/21 11:22 AM   Specimen: Nasopharyngeal Swab; Nasopharyngeal(NP) swabs in vial transport medium  Result Value Ref Range Status   SARS Coronavirus 2 by RT PCR NEGATIVE NEGATIVE Final    Comment: (NOTE) SARS-CoV-2 target nucleic acids are NOT DETECTED.  The SARS-CoV-2 RNA is generally detectable in upper respiratory specimens during the acute phase of infection. The lowest concentration of SARS-CoV-2 viral copies this assay can detect is 138 copies/mL. A negative result does not preclude SARS-Cov-2 infection and should not be used as the sole basis for treatment or other patient management decisions. A negative result may occur with  improper specimen collection/handling, submission of specimen other than nasopharyngeal swab, presence of viral mutation(s) within the areas targeted by this assay, and inadequate number of viral copies(<138 copies/mL). A negative result must be  combined with clinical observations, patient history, and epidemiological information. The expected result is Negative.  Fact Sheet for Patients:  EntrepreneurPulse.com.au  Fact Sheet for Healthcare Providers:  IncredibleEmployment.be  This test is no t yet approved or cleared by the [place] FDA and  has been authorized for detection and/or diagnosis of SARS-CoV-2 by FDA under an Emergency Use Authorization (EUA). This EUA will remain  in effect (meaning this test can be used) for the duration of the COVID-19 declaration under Section 564(b)(1) of the Act, 21 U.S.C.section 360bbb-3(b)(1), unless the authorization is terminated  or revoked sooner.       Influenza A by PCR NEGATIVE NEGATIVE Final   Influenza B by PCR NEGATIVE NEGATIVE Final    Comment: (NOTE) The Xpert Xpress SARS-CoV-2/FLU/RSV plus assay is intended as an aid in the diagnosis of influenza from Nasopharyngeal swab specimens and should not be used as a sole basis for treatment. Nasal washings and aspirates are unacceptable for Xpert Xpress SARS-CoV-2/FLU/RSV testing.  Fact Sheet for Patients: EntrepreneurPulse.com.au  Fact Sheet for Healthcare Providers: IncredibleEmployment.be  This test is not yet approved or cleared by the [place] FDA and has been authorized for detection and/or diagnosis of SARS-CoV-2 by FDA under an Emergency Use Authorization (EUA). This EUA will remain in effect (meaning this test can be used) for the duration of the COVID-19 declaration under Section 564(b)(1) of the Act, 21 U.S.C. section 360bbb-3(b)(1), unless the authorization is terminated or revoked.  Performed at [facility], [place] [address] [place], [place] [address]      Labs: BNP (last 3 results) Recent Labs    02/17/21 1330  BNP 22.4   Basic Metabolic Panel: Recent Labs  Lab 05/19/21 1855 05/20/21 0056 05/21/21 0149  05/22/21 0408 05/22/21 1732 05/23/21 1108  NA 138 138 141 142  --  144  K 3.4* 3.7 2.8* 3.0*  --  3.2*  CL 115* 116* 117* 119*  --  118*  CO2 14* 12* 14* 17*  --  19*  GLUCOSE 155* 104* 110* 97 127* 126*  BUN 73* 70* 56* 49*  --  39*  CREATININE 5.04* 4.69* 4.51* 4.26*  --  4.04*  CALCIUM 6.3* 6.9* 6.9* 6.6*  --  6.7*   Liver Function Tests: Recent Labs  Lab 05/16/21 1641 05/18/21 0210  AST 23 28  ALT 16 19  ALKPHOS 82 73  BILITOT 0.6 0.5  PROT 6.8 5.9*  ALBUMIN 2.9* 2.4*   Recent Labs  Lab 05/16/21 1641  LIPASE 23   No results for input(s): AMMONIA in the last 168 hours. CBC: Recent Labs  Lab 05/16/21 1641 05/18/21 0210 05/19/21 0236 05/20/21 0056 05/21/21 0717  WBC 14.8* 13.3* 14.2* 12.7* 8.4  HGB 9.6* 8.3* 8.1* 8.3* 8.4*  HCT 31.4* 25.8* 24.4* 24.7* 26.4*  MCV 97.5 93.1 92.8 90.8 92.6  PLT 232 196 174 150 145*   Cardiac Enzymes: Recent Labs  Lab 05/17/21 0926  CKTOTAL 307   BNP: Invalid input(s): POCBNP CBG: Recent Labs  Lab 05/22/21 0819 05/22/21 0849 05/22/21 1138 05/22/21 1653 05/22/21 1747  GLUCAP 39* 83 112* 40* 81   D-Dimer No results for input(s): DDIMER in the last 72 hours. Hgb A1c No results for input(s): HGBA1C in the last 72 hours. Lipid Profile No results for input(s): CHOL, HDL, LDLCALC, TRIG, CHOLHDL, LDLDIRECT in the last 72 hours. Thyroid function studies No results for input(s): TSH, T4TOTAL, T3FREE, THYROIDAB in the last 72 hours.  Invalid input(s): FREET3 Anemia work up No results for input(s): VITAMINB12, FOLATE, FERRITIN, TIBC, IRON, RETICCTPCT in the last 72 hours. Urinalysis    Component Value Date/Time   COLORURINE YELLOW 05/18/2021 1723   APPEARANCEUR HAZY (A) 05/18/2021 1723   LABSPEC 1.010 05/18/2021 1723   PHURINE 5.0 05/18/2021 1723   GLUCOSEU NEGATIVE 05/18/2021 1723   HGBUR MODERATE (A) 05/18/2021 1723   BILIRUBINUR NEGATIVE 05/18/2021 1723   KETONESUR 5 (A) 05/18/2021 1723   PROTEINUR 30 (A)  05/18/2021 1723   NITRITE NEGATIVE 05/18/2021 1723   LEUKOCYTESUR NEGATIVE 05/18/2021 1723   Sepsis Labs Invalid input(s): PROCALCITONIN,  WBC,  LACTICIDVEN Microbiology Recent Results (from the past 240 hour(s))  C Difficile Quick Screen w PCR reflex     Status: Abnormal   Collection Time: 05/17/21  9:43 AM   Specimen: STOOL  Result Value Ref Range Status   C Diff antigen POSITIVE (A) NEGATIVE Final    Comment: CRITICAL RESULT CALLED TO, READ BACK BY AND VERIFIED WITH: RN Ardeth Perfect  [id number] _0  FH    C Diff toxin POSITIVE (A) NEGATIVE Final   C Diff interpretation Toxin producing C. difficile detected.  Final    Comment: Performed at [facility], [place] [address] [place],  [address]  Gastrointestinal Panel by PCR , Stool     Status: None   Collection Time: 05/17/21  9:43 AM   Specimen: STOOL  Result Value Ref Range Status   Campylobacter species NOT DETECTED NOT DETECTED Final   Plesimonas shigelloides NOT DETECTED NOT DETECTED Final   Salmonella species NOT DETECTED NOT DETECTED Final   Yersinia enterocolitica NOT DETECTED NOT DETECTED Final   Vibrio species NOT DETECTED NOT DETECTED Final   Vibrio cholerae NOT DETECTED NOT DETECTED Final   Enteroaggregative E coli (EAEC) NOT DETECTED NOT DETECTED Final   Enteropathogenic E coli (EPEC) NOT DETECTED NOT DETECTED Final   Enterotoxigenic E coli (ETEC) NOT DETECTED NOT DETECTED Final   Shiga like toxin producing E coli (STEC) NOT DETECTED NOT DETECTED Final   Shigella/Enteroinvasive E coli (EIEC) NOT DETECTED NOT DETECTED Final   Cryptosporidium NOT DETECTED NOT DETECTED Final   Cyclospora cayetanensis NOT DETECTED NOT DETECTED Final   Entamoeba histolytica NOT DETECTED NOT DETECTED Final   Giardia lamblia NOT DETECTED NOT DETECTED Final   Adenovirus F40/41 NOT DETECTED NOT DETECTED Final  Astrovirus NOT DETECTED NOT DETECTED Final   Norovirus GI/GII NOT DETECTED NOT DETECTED Final   Rotavirus A NOT DETECTED NOT  DETECTED Final   Sapovirus (I, II, IV, and V) NOT DETECTED NOT DETECTED Final    Comment: Performed at [facility], [address], [place], [place] [address]  C. Diff by PCR, Reflexed     Status: Abnormal   Collection Time: 05/17/21  9:43 AM  Result Value Ref Range Status   Toxigenic C. Difficile by PCR POSITIVE (A) NEGATIVE Final    Comment: Positive for toxigenic C. difficile with little to no toxin production. Only treat if clinical presentation suggests symptomatic illness. Performed at [facility], [place] [address] [place], [place] [address]   Resp Panel by RT-PCR (Flu A&B, Covid) Nasopharyngeal Swab     Status: None   Collection Time: 05/17/21 11:22 AM   Specimen: Nasopharyngeal Swab; Nasopharyngeal(NP) swabs in vial transport medium  Result Value Ref Range Status   SARS Coronavirus 2 by RT PCR NEGATIVE NEGATIVE Final    Comment: (NOTE) SARS-CoV-2 target nucleic acids are NOT DETECTED.  The SARS-CoV-2 RNA is generally detectable in upper respiratory specimens during the acute phase of infection. The lowest concentration of SARS-CoV-2 viral copies this assay can detect is 138 copies/mL. A negative result does not preclude SARS-Cov-2 infection and should not be used as the sole basis for treatment or other patient management decisions. A negative result may occur with  improper specimen collection/handling, submission of specimen other than nasopharyngeal swab, presence of viral mutation(s) within the areas targeted by this assay, and inadequate number of viral copies(<138 copies/mL). A negative result must be combined with clinical observations, patient history, and epidemiological information. The expected result is Negative.  Fact Sheet for Patients:  EntrepreneurPulse.com.au  Fact Sheet for Healthcare Providers:  IncredibleEmployment.be  This test is no t yet approved or cleared by the [place] FDA and  has been  authorized for detection and/or diagnosis of SARS-CoV-2 by FDA under an Emergency Use Authorization (EUA). This EUA will remain  in effect (meaning this test can be used) for the duration of the COVID-19 declaration under Section 564(b)(1) of the Act, 21 U.S.C.section 360bbb-3(b)(1), unless the authorization is terminated  or revoked sooner.       Influenza A by PCR NEGATIVE NEGATIVE Final   Influenza B by PCR NEGATIVE NEGATIVE Final    Comment: (NOTE) The Xpert Xpress SARS-CoV-2/FLU/RSV plus assay is intended as an aid in the diagnosis of influenza from Nasopharyngeal swab specimens and should not be used as a sole basis for treatment. Nasal washings and aspirates are unacceptable for Xpert Xpress SARS-CoV-2/FLU/RSV testing.  Fact Sheet for Patients: EntrepreneurPulse.com.au  Fact Sheet for Healthcare Providers: IncredibleEmployment.be  This test is not yet approved or cleared by the [place] FDA and has been authorized for detection and/or diagnosis of SARS-CoV-2 by FDA under an Emergency Use Authorization (EUA). This EUA will remain in effect (meaning this test can be used) for the duration of the COVID-19 declaration under Section 564(b)(1) of the Act, 21 U.S.C. section 360bbb-3(b)(1), unless the authorization is terminated or revoked.  Performed at [facility], [place] [address], [place], [place] [address]      Time coordinating discharge: 40 minutes  SIGNED:   Elmarie Shiley, MD  [facility]

## 2021-05-23 NOTE — Plan of Care (Signed)
Problem: Activity: Goal: Risk for activity intolerance will decrease Outcome: Progressing   

## 2021-05-23 NOTE — Progress Notes (Signed)
DISCHARGE NOTE HOME [name] to be discharged Home per MD order. Discussed prescriptions and follow up appointments with the patient. Prescriptions given to patient; medication list explained in detail. Patient verbalized understanding.  Skin clean, dry and intact without evidence of skin break down, no evidence of skin tears noted. IV catheter discontinued intact. Site without signs and symptoms of complications. Dressing and pressure applied. Pt denies pain at the site currently. No complaints noted.  Patient free of lines, drains, and wounds.   An After Visit Summary (AVS) was printed and given to the patient. Patient escorted via wheelchair, and discharged home via private auto.  Arlyss Repress, RN

## 2021-05-23 NOTE — Discharge Instructions (Signed)
Take 5 more days of Dificid.

## 2021-05-23 NOTE — TOC Transition Note (Signed)
Transition of Care [facility]) - CM/SW Discharge Note   Patient Details  Name: [name] MRN: [id number] Date of Birth: [birth year]  [facility] CM/SW Contact:  Tom-Johnson, Renea Ee, RN Phone Number: 05/23/2021, 3:15 PM   Clinical Narrative:     Patient is scheduled for discharge today. No PT/OT followup noted. [place] pharmacy delivered patient's medication at bedside. Wife to transport at discharge. No further TOC needs noted.  Final next level of care: Home/Self Care Barriers to Discharge: Barriers Resolved   Patient Goals and CMS Choice Patient states their goals for this hospitalization and ongoing recovery are:: To return home with wife CMS Medicare.gov Compare Post Acute Care list provided to:: Patient    Discharge Placement                       Discharge Plan and Services   Discharge Planning Services: CM Consult            DME Arranged: N/A         HH Arranged: NA HH Agency: NA        Social Determinants of Health ([place]) Interventions     Readmission Risk Interventions Readmission Risk Prevention Plan 05/23/2021 11/10/2019 02/11/2019  Transportation Screening Complete Complete Complete  PCP or Specialist Appt within 3-5 Days Complete Complete Complete  HRI or Home Care Consult Complete Complete Complete  Social Work Consult for [place] Planning/Counseling Complete Complete Complete  Palliative Care Screening Not Applicable Not Applicable Not Applicable  Medication Review Press photographer) Complete Complete Complete  Some recent data might be hidden

## 2021-05-28 ENCOUNTER — Emergency Department (HOSPITAL_COMMUNITY): Payer: Medicare Other

## 2021-05-28 ENCOUNTER — Other Ambulatory Visit: Payer: Self-pay

## 2021-05-28 ENCOUNTER — Emergency Department (HOSPITAL_COMMUNITY)
Admission: EM | Admit: 2021-05-28 | Discharge: 2021-05-28 | Disposition: A | Discharge status: Home / Self Care | Payer: Medicare Other | Attending: Emergency Medicine | Admitting: Emergency Medicine

## 2021-05-28 DIAGNOSIS — R569 Unspecified convulsions: Secondary | ICD-10-CM | POA: Diagnosis present

## 2021-05-28 DIAGNOSIS — E039 Hypothyroidism, unspecified: Secondary | ICD-10-CM | POA: Diagnosis not present

## 2021-05-28 DIAGNOSIS — D72819 Decreased white blood cell count, unspecified: Secondary | ICD-10-CM | POA: Insufficient documentation

## 2021-05-28 DIAGNOSIS — Z79899 Other long term (current) drug therapy: Secondary | ICD-10-CM | POA: Diagnosis not present

## 2021-05-28 DIAGNOSIS — N189 Chronic kidney disease, unspecified: Secondary | ICD-10-CM | POA: Diagnosis not present

## 2021-05-28 DIAGNOSIS — I129 Hypertensive chronic kidney disease with stage 1 through stage 4 chronic kidney disease, or unspecified chronic kidney disease: Secondary | ICD-10-CM | POA: Diagnosis not present

## 2021-05-28 LAB — CK: Total CK: 207 U/L (ref 49–397)

## 2021-05-28 LAB — CBC WITH DIFFERENTIAL/PLATELET
Abs Immature Granulocytes: 0.02 10*3/uL (ref 0.00–0.07)
Basophils Absolute: 0 10*3/uL (ref 0.0–0.1)
Basophils Relative: 1 %
Eosinophils Absolute: 0 10*3/uL (ref 0.0–0.5)
Eosinophils Relative: 1 %
HCT: 25.9 % — ABNORMAL LOW (ref 39.0–52.0)
Hemoglobin: 7.5 g/dL — ABNORMAL LOW (ref 13.0–17.0)
Immature Granulocytes: 1 %
Lymphocytes Relative: 20 %
Lymphs Abs: 0.7 10*3/uL (ref 0.7–4.0)
MCH: 29 pg (ref 26.0–34.0)
MCHC: 29 g/dL — ABNORMAL LOW (ref 30.0–36.0)
MCV: 100 fL (ref 80.0–100.0)
Monocytes Absolute: 0.3 10*3/uL (ref 0.1–1.0)
Monocytes Relative: 7 %
Neutro Abs: 2.6 10*3/uL (ref 1.7–7.7)
Neutrophils Relative %: 70 %
Platelets: 141 10*3/uL — ABNORMAL LOW (ref 150–400)
RBC: 2.59 MIL/uL — ABNORMAL LOW (ref 4.22–5.81)
RDW: 18 % — ABNORMAL HIGH (ref 11.5–15.5)
WBC: 3.6 10*3/uL — ABNORMAL LOW (ref 4.0–10.5)
nRBC: 0 % (ref 0.0–0.2)

## 2021-05-28 LAB — COMPREHENSIVE METABOLIC PANEL
ALT: 51 U/L — ABNORMAL HIGH (ref 0–44)
AST: 78 U/L — ABNORMAL HIGH (ref 15–41)
Albumin: 2 g/dL — ABNORMAL LOW (ref 3.5–5.0)
Alkaline Phosphatase: 59 U/L (ref 38–126)
Anion gap: 11 (ref 5–15)
BUN: 35 mg/dL — ABNORMAL HIGH (ref 8–23)
CO2: 23 mmol/L (ref 22–32)
Calcium: 5.9 mg/dL — CL (ref 8.9–10.3)
Chloride: 110 mmol/L (ref 98–111)
Creatinine, Ser: 3.08 mg/dL — ABNORMAL HIGH (ref 0.61–1.24)
GFR, Estimated: 22 mL/min — ABNORMAL LOW (ref >60)
Glucose, Bld: 105 mg/dL — ABNORMAL HIGH (ref 70–99)
Potassium: 3.1 mmol/L — ABNORMAL LOW (ref 3.5–5.1)
Sodium: 144 mmol/L (ref 135–145)
Total Bilirubin: 0.3 mg/dL (ref 0.3–1.2)
Total Protein: 5.2 g/dL — ABNORMAL LOW (ref 6.5–8.1)

## 2021-05-28 LAB — RAPID URINE DRUG SCREEN, HOSP PERFORMED
Amphetamines: NOT DETECTED
Barbiturates: NOT DETECTED
Benzodiazepines: NOT DETECTED
Cocaine: NOT DETECTED
Opiates: NOT DETECTED
Tetrahydrocannabinol: NOT DETECTED

## 2021-05-28 LAB — ETHANOL: Alcohol, Ethyl (B): <10 mg/dL (ref <10)

## 2021-05-28 LAB — CBG MONITORING, ED: Glucose-Capillary: 107 mg/dL — ABNORMAL HIGH (ref 70–99)

## 2021-05-28 IMAGING — CT CT HEAD W/O CM
5 of 6 series · 17 of 47 positions shown, 18 images · non-contrast
Comparison: None.

CLINICAL DATA: 60-year-old male with new onset seizure.



[Series 3: head wo · axial · 0.46mm/px · z∈[+1626,+1696]mm · 3 of 30 slices shown, 4 images]
[im 8/30  brain]
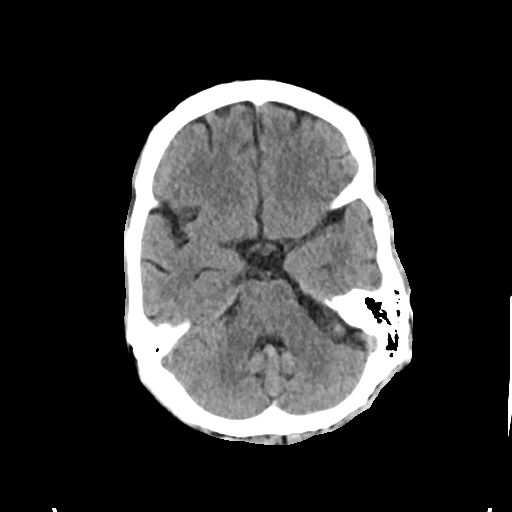
[im 8/30  bone]
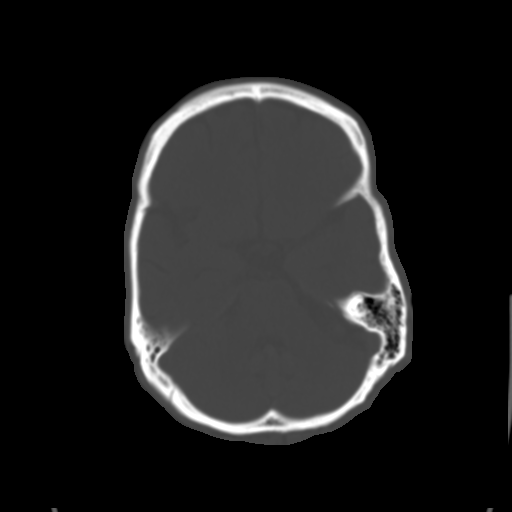
[im 15/30  brain]
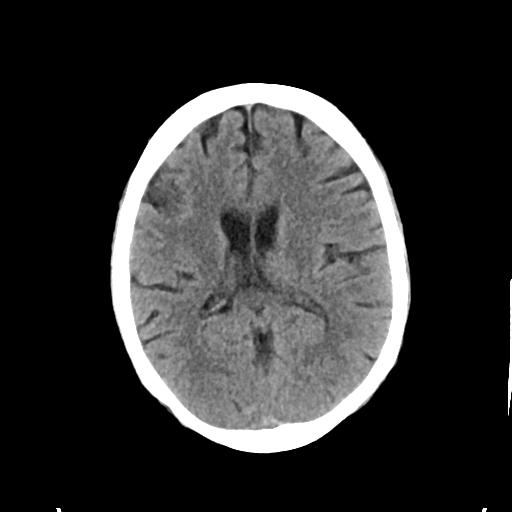
[im 22/30  brain]
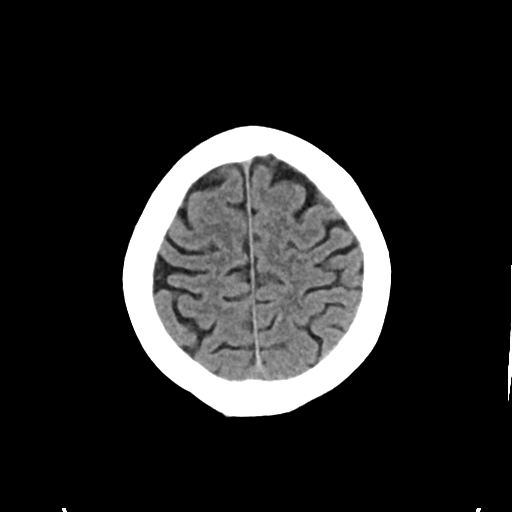

[Series 5: cor soft · coronal · 0.32mm/px · 3 of 60 slices shown]
[im 20/60  brain]
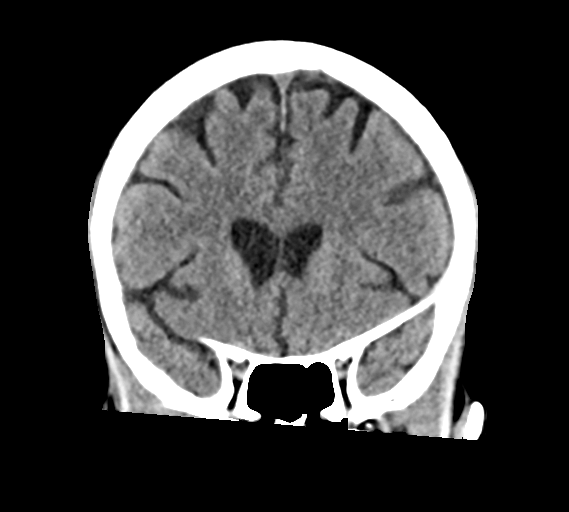
[im 27/60  brain]
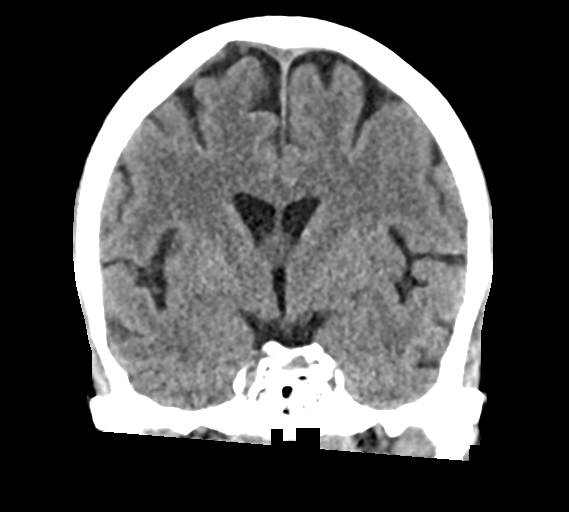
[im 33/60  brain]
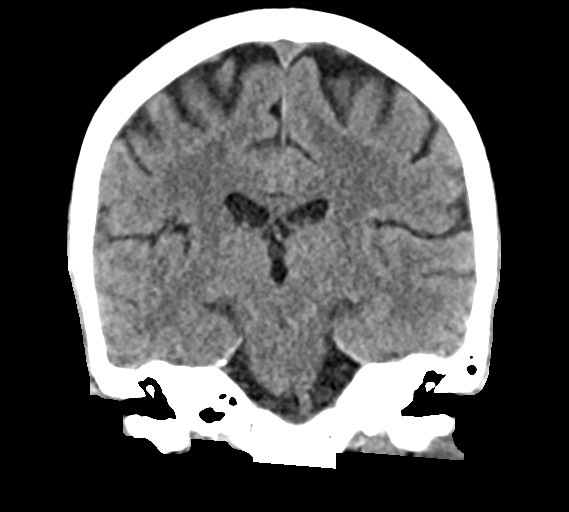

[Series 6: sag soft · sagittal · 0.32mm/px · 3 of 53 slices shown]
[im 18/53  brain]
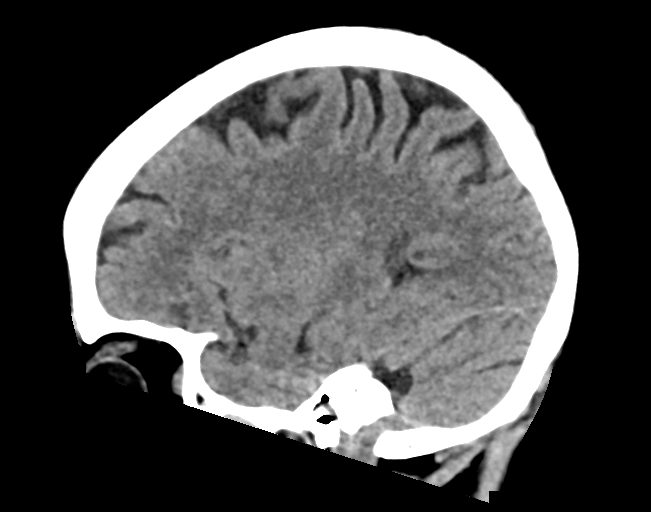
[im 27/53  brain]
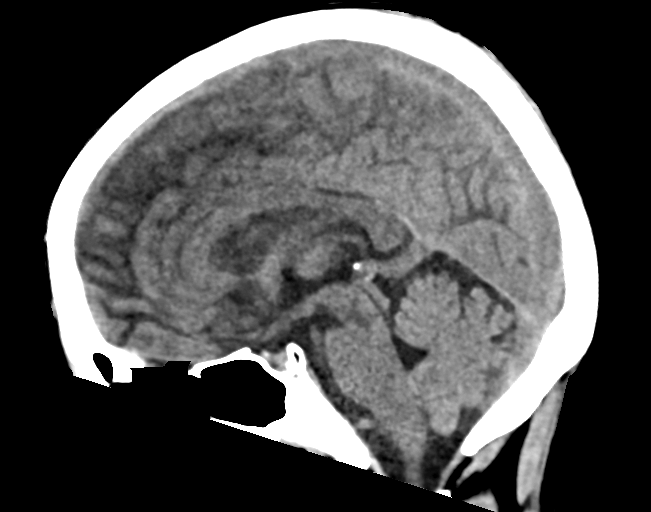
[im 35/53  brain]
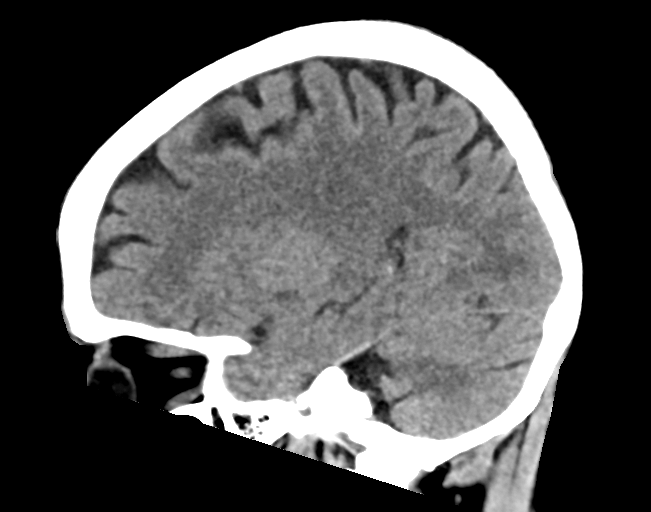

[Series 7: head wo true ax · axial · 0.35mm/px · z∈[+1590,+1642]mm · 2 of 33 slices shown]
[im 11/33  brain]
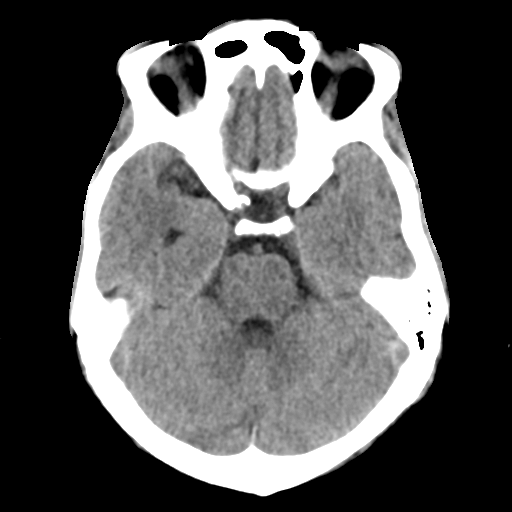
[im 22/33  brain]
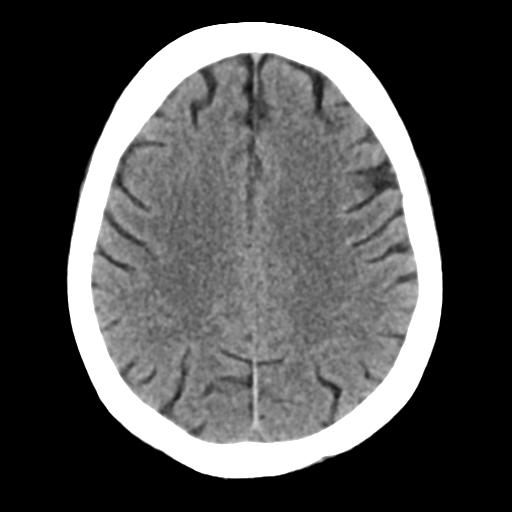

[Series 8: head bone · axial · 0.35mm/px · z∈[+1555,+1646]mm · 6 of 82 slices shown]
[im 9/82  bone]
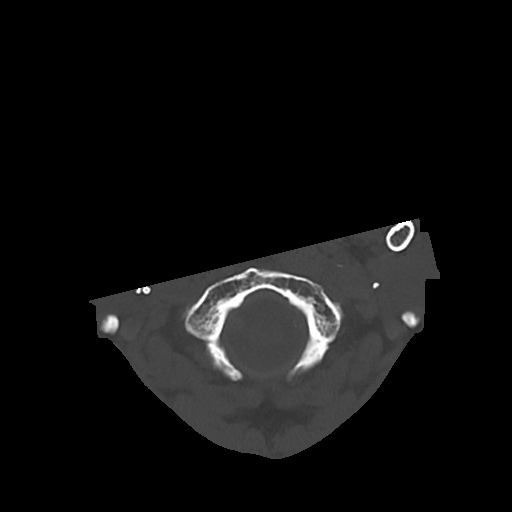
[im 17/82  bone]
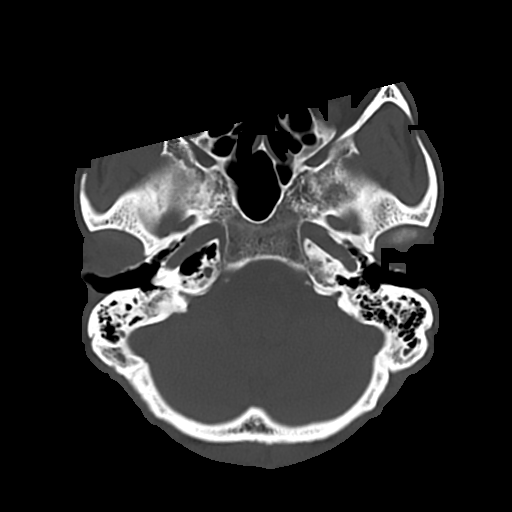
[im 25/82  bone]
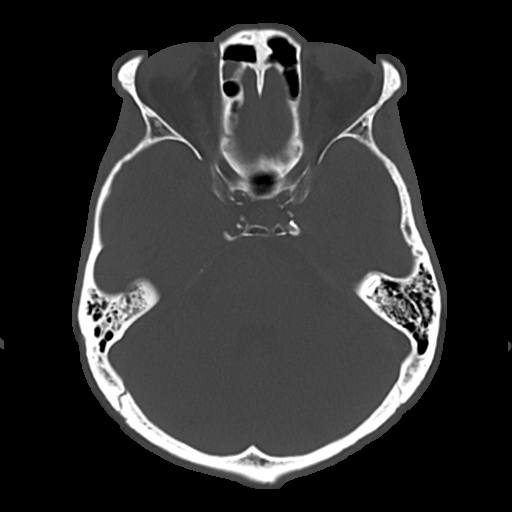
[im 33/82  bone]
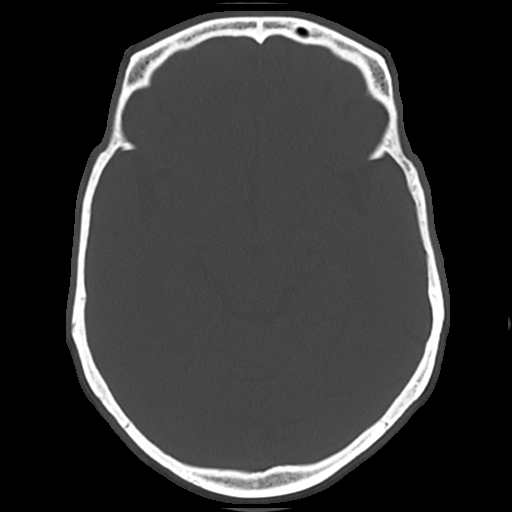
[im 49/82  bone]
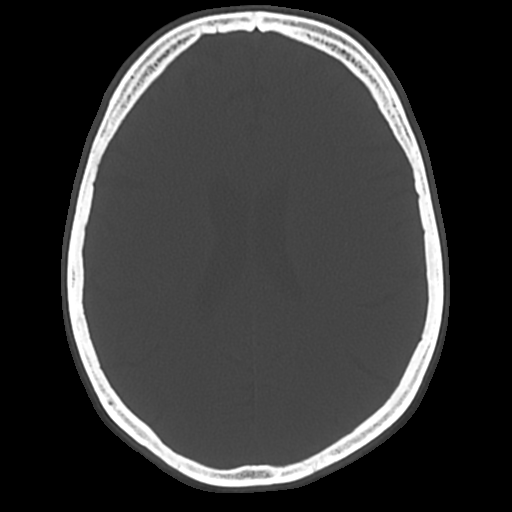
[im 57/82  bone]
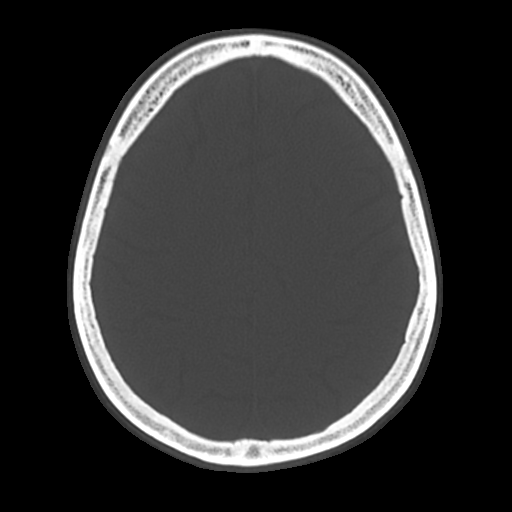

[17 of 47 positions shown; findings below may reference images not displayed]

FINDINGS: Brain: No evidence of acute infarction, hemorrhage, hydrocephalus,
extra-axial collection or mass lesion/mass effect. Mild atrophy
noted.

Vascular: Carotid atherosclerotic calcifications are noted.

Skull: Normal. Negative for fracture or focal lesion.

Sinuses/Orbits: No acute finding.

Other: None.
IMPRESSION: 1. No evidence of acute intracranial abnormality.
2. Mild atrophy.

## 2021-05-28 MED ORDER — CALCIUM CARBONATE 1250 (500 CA) MG PO TABS
1.0000 | ORAL_TABLET | ORAL | Status: AC
  Administered 2021-05-28: 500 mg via ORAL
  Filled 2021-05-28: qty 1

## 2021-05-28 NOTE — ED Triage Notes (Signed)
Pt brought in by EMS from home for a witnessed seizure. Pt denies any previous seizure history. Pt alert and oriented at this time and denies any pain. Pt does not remember having a seizure.

## 2021-05-28 NOTE — ED Provider Notes (Signed)
[facility] EMERGENCY DEPARTMENT Provider Note   CSN: [id number] Arrival date & time: 05/28/21  1944     History  Chief Complaint  Patient presents with   Seizures    [name] is a 61 y.o. male with history of CKD, gout, hypothyroid, hypertension, scleroderma.  Patient presents to ED for evaluation of supposed seizure-like activity earlier today.  Patient is accompanied by his wife who provides some of the history.  Patient reports he was in his usual state of health earlier today when he suddenly lost consciousness and had seizure-like activity as reported by his wife.  Patient's wife reports that the seizure-like activity lasted for about 10 minutes, she is unsure what time specifically this happened.  Patient's wife reports tonic-clonic activity.  Patient reports that after the seizure he had a few moments of confusion upon waking.  Patient denies bowel or bladder incontinence during this episode.  On my evaluation, patient denies any pain, denies any weakness.   Seizures     Home Medications Prior to Admission medications   Medication Sig Start Date End Date Taking? Authorizing Provider  allopurinol (ZYLOPRIM) 100 MG tablet TAKE 1 TABLET BY MOUTH EVERY DAY Patient taking differently: Take 100 mg by mouth daily. 05/09/21   Bo Merino, MD  ALPRAZolam Duanne Moron) 0.5 MG tablet Take 0.25 mg by mouth 2 (two) times daily as needed for anxiety.    [provider]  calcium carbonate (TUMS - DOSED IN MG ELEMENTAL CALCIUM) 500 MG chewable tablet Chew 1 tablet by mouth 2 (two) times daily with a meal. 05/20/21   Regalado, Belkys A, MD  colchicine 0.6 MG tablet Take 0.6 mg by mouth daily as needed (gout attacks).    [provider]  Epoetin Alfa-epbx (RETACRIT IJ) Inject as directed every 30 (thirty) days.    [provider]  guaiFENesin (MUCINEX) 600 MG 12 hr tablet Take 600 mg by mouth 2 (two) times daily as needed for cough.    [provider]  HYDROcodone-acetaminophen (NORCO) 10-325 MG tablet Take 0.5-1 tablets by mouth every 6 (six) hours as needed for moderate pain.    [provider]  macitentan (OPSUMIT) 10 MG tablet Take 1 tablet (10 mg total) by mouth daily. 07/19/20   Bensimhon, Shaune Pascal, MD  methocarbamol (ROBAXIN) 500 MG tablet Take 1 tablet (500 mg total) by mouth 2 (two) times daily as needed for muscle spasms. 10/01/20   Ofilia Neas, PA-C  multivitamin-iron-minerals-folic acid (CENTRUM) chewable tablet Chew 1 tablet by mouth daily.    [provider]  pantoprazole (PROTONIX) 40 MG tablet Take 1 tablet (40 mg total) by mouth daily for 90 doses. 04/27/21 07/26/21  Cirigliano, Vito V, DO  potassium chloride SA (KLOR-CON M20) 20 MEQ tablet Take 1 tablet (20 mEq total) by mouth daily. 05/23/21   Regalado, Belkys A, MD  rosuvastatin (CRESTOR) 20 MG tablet Take 20 mg by mouth daily. 09/03/20   [provider]  sildenafil (REVATIO) 20 MG tablet Take 2 tablets (40 mg total) by mouth 3 (three) times daily. 01/03/21 01/03/22  Duke, Tami Lin, PA  sodium bicarbonate 650 MG tablet Take 2 tablets (1,300 mg total) by mouth 3 (three) times daily. 05/23/21   Regalado, Belkys A, MD  torsemide (DEMADEX) 20 MG tablet Take 20 mg by mouth daily.    [provider]      Allergies    Oxycodone    Review of Systems   Review of Systems  Constitutional:  Negative for chills and fever.  Respiratory:  Negative for shortness of breath.   Cardiovascular:  Negative for chest pain.  Gastrointestinal:  Negative for abdominal pain, nausea and vomiting.  Neurological:  Positive for seizures and syncope. Negative for dizziness, weakness, light-headedness and numbness.  All other systems reviewed and are negative.  Physical Exam Updated Vital Signs BP (!) 142/83    Pulse 96    Temp 98.3 F (36.8 C) (Oral)    Resp 16    Ht _0  (1.753 m)    Wt 52.2 kg    SpO2 96%    BMI 16.98 kg/m  Physical Exam  ED  Results / Procedures / Treatments   Labs (all labs ordered are listed, but only abnormal results are displayed) Labs Reviewed  CBC WITH DIFFERENTIAL/PLATELET - Abnormal; Notable for the following components:      Result Value   WBC 3.6 (*)    RBC 2.59 (*)    Hemoglobin 7.5 (*)    HCT 25.9 (*)    MCHC 29.0 (*)    RDW 18.0 (*)    Platelets 141 (*)    All other components within normal limits  COMPREHENSIVE METABOLIC PANEL - Abnormal; Notable for the following components:   Potassium 3.1 (*)    Glucose, Bld 105 (*)    BUN 35 (*)    Creatinine, Ser 3.08 (*)    Calcium 5.9 (*)    Total Protein 5.2 (*)    Albumin 2.0 (*)    AST 78 (*)    ALT 51 (*)    GFR, Estimated 22 (*)    All other components within normal limits  CBG MONITORING, ED - Abnormal; Notable for the following components:   Glucose-Capillary 107 (*)    All other components within normal limits  ETHANOL  CK  RAPID URINE DRUG SCREEN, HOSP PERFORMED    EKG None  Radiology CT Head Wo Contrast  Result Date: 05/28/2021 CLINICAL DATA:  61 year old male with new onset seizure. EXAM: CT HEAD WITHOUT CONTRAST TECHNIQUE: Contiguous axial images were obtained from the base of the skull through the vertex without intravenous contrast. RADIATION DOSE REDUCTION: This exam was performed according to the departmental dose-optimization program which includes automated exposure control, adjustment of the mA and/or kV according to patient size and/or use of iterative reconstruction technique. COMPARISON:  None. FINDINGS: Brain: No evidence of acute infarction, hemorrhage, hydrocephalus, extra-axial collection or mass lesion/mass effect. Mild atrophy noted. Vascular: Carotid atherosclerotic calcifications are noted. Skull: Normal. Negative for fracture or focal lesion. Sinuses/Orbits: No acute finding. Other: None. IMPRESSION: 1. No evidence of acute intracranial abnormality. 2. Mild atrophy. Electronically Signed   By: Margarette Canada M.D.    On: 05/28/2021 20:51    Procedures Procedures    Medications Ordered in ED Medications  calcium carbonate (OS-CAL - dosed in mg of elemental calcium) tablet 500 mg of elemental calcium (500 mg of elemental calcium Oral Given 05/28/21 2217)    ED Course/ Medical Decision Making/ A&P                           Medical Decision Making Amount and/or Complexity of Data Reviewed Labs: ordered. Radiology: ordered. ECG/medicine tests: ordered.   61 year old male presents due to suppose it seizure-like activity this afternoon.  On examination, patient is alert and oriented x3, afebrile, nontachycardic, clear lung sounds, no abdominal tenderness.  Patient wife is at bedside and reports several  moments of confusion upon the patient waking.  Patient denies any bowel or bladder incontinence during this event.  Patient work-up will include the following labs and imaging studies that will be interpreted and ordered by me: CBG, CK, CBC, CMP, ethanol, urine drug screen, CT head CBG: 107 CK: 207, within normal limits CBC: Decreased white blood cell 3.6, decreased hemoglobin 7.5.  These are both in line with patient's baseline. CMP: Shows hypocalcemia to 5.9.  All other lab values are at patient baseline.  Patient corrected calcium is 7.5 per MD Calc. Ethanol: No ethanol Urine drug screen: Negative CT head: No intracranial abnormality  At this time, I feel that this patient is stable for discharge.  His lab work is reassuring, he has shown no seizure-like activity while here in the emergency department.  He is alert and oriented x4.  He has his wife living at home who can provide care.  I provided the patient with return precautions and he voices understanding.  All the patient's questions have been answered.  I have advised the patient he cannot drive or operate heavy machinery until he has been evaluated by neurologist and he voices understanding.  The patient has been referred to a neurologist.  The  patient is stable for discharge at this time.    Final Clinical Impression(s) / ED Diagnoses Final diagnoses:  Seizure-like activity [facility])    Rx / [place] Orders ED Discharge Orders     None         Lawana Chambers 05/28/21 2302    Truddie Hidden, MD 05/28/21 2315

## 2021-05-28 NOTE — Discharge Instructions (Addendum)
Please return to ED with any new or worsening symptoms such as continued seizure activity Do not drive or operate heavy machinery until you been evaluated by neurologist Please follow-up with neurology.  I have referred you to 1, you will need to call and make an appointment on Monday

## 2021-05-28 NOTE — ED Notes (Signed)
Lab called, calcium 5.9. PA Groce made aware.

## 2021-05-28 NOTE — ED Notes (Signed)
Pt discharged and ambulated wheeled out of the ED in a wheel chair without difficulty.

## 2021-05-30 ENCOUNTER — Telehealth: Payer: Self-pay | Admitting: Rheumatology

## 2021-05-30 NOTE — Telephone Encounter (Signed)
Patients wife called the office stating he was seen in the ED for seizure like activity this weekend and they recommended he see a neurologist. [name], patients wife wanted to know if Dr. Estanislado Pandy could recommend a neurologist based on his condition. [phone]

## 2021-05-30 NOTE — Telephone Encounter (Signed)
Dr. Delice Lesch

## 2021-05-30 NOTE — Telephone Encounter (Signed)
[name], Dr. Estanislado Pandy recommends Dr. Delice Lesch.

## 2021-05-31 ENCOUNTER — Ambulatory Visit (INDEPENDENT_AMBULATORY_CARE_PROVIDER_SITE_OTHER): Payer: Medicare Other | Admitting: Neurology

## 2021-05-31 ENCOUNTER — Other Ambulatory Visit: Payer: Self-pay

## 2021-05-31 ENCOUNTER — Encounter: Payer: Self-pay | Admitting: Neurology

## 2021-05-31 ENCOUNTER — Ambulatory Visit (INDEPENDENT_AMBULATORY_CARE_PROVIDER_SITE_OTHER): Payer: Medicare Other | Admitting: Podiatry

## 2021-05-31 ENCOUNTER — Encounter: Payer: Self-pay | Admitting: Podiatry

## 2021-05-31 VITALS — BP 116/75 | HR 122 | Resp 20 | Ht 69.0 in | Wt 115.0 lb

## 2021-05-31 DIAGNOSIS — M79675 Pain in left toe(s): Secondary | ICD-10-CM | POA: Diagnosis not present

## 2021-05-31 DIAGNOSIS — R569 Unspecified convulsions: Secondary | ICD-10-CM | POA: Diagnosis not present

## 2021-05-31 DIAGNOSIS — B351 Tinea unguium: Secondary | ICD-10-CM

## 2021-05-31 DIAGNOSIS — M79674 Pain in right toe(s): Secondary | ICD-10-CM

## 2021-05-31 DIAGNOSIS — M349 Systemic sclerosis, unspecified: Secondary | ICD-10-CM | POA: Diagnosis not present

## 2021-05-31 MED ORDER — BRIVIACT 50 MG PO TABS: ORAL_TABLET | ORAL | 5 refills | Status: DC

## 2021-05-31 MED ORDER — TOPIRAMATE 25 MG PO TABS: 25.0000 mg | ORAL_TABLET | Freq: Two times a day (BID) | ORAL | 6 refills | Status: DC

## 2021-05-31 NOTE — Addendum Note (Signed)
Addended by: Cameron Sprang on: 05/31/2021 03:19 PM   Modules accepted: Orders

## 2021-05-31 NOTE — Patient Instructions (Addendum)
Schedule MRI brain without contrast  2. Schedule 1-hour EEG  3. Start Briviact 88m: take 1 tablet twice a day  4. Follow-up in 3 months, call for any changes   Seizure Precautions: 1. If medication has been prescribed for you to prevent seizures, take it exactly as directed.  Do not stop taking the medicine without talking to your doctor first, even if you have not had a seizure in a long time.   2. Avoid activities in which a seizure would cause danger to yourself or to others.  Don't operate dangerous machinery, swim alone, or climb in high or dangerous places, such as on ladders, roofs, or girders.  Do not drive unless your doctor says you may.  3. If you have any warning that you may have a seizure, lay down in a safe place where you can't hurt yourself.    4.  No driving for 6 months from last seizure, as per [facility]   Please refer to the following link on the [facility]website for more information: [email or web address]   5.  Maintain good sleep hygiene. Avoid alcohol.  6.  Contact your doctor if you have any problems that may be related to the medicine you are taking.  7.  Call 911 and bring the patient back to the ED if:        A.  The seizure lasts longer than 5 minutes.       B.  The patient doesn't awaken shortly after the seizure  C.  The patient has new problems such as difficulty seeing, speaking or moving  D.  The patient was injured during the seizure  E.  The patient has a temperature over 102 F (39C)  F.  The patient vomited and now is having trouble breathing   [facility] and they will call you directly to schedule your appointment. They are located at [address] If you need to contact them directly please call [phone]
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
## 2021-06-01 ENCOUNTER — Telehealth: Payer: Self-pay

## 2021-06-01 ENCOUNTER — Ambulatory Visit: Payer: 59 | Admitting: Neurology

## 2021-06-01 ENCOUNTER — Telehealth: Payer: Self-pay | Admitting: Neurology

## 2021-06-01 NOTE — Telephone Encounter (Signed)
New message   Your information has been sent to [facility]  [facility] Key: [facility] [place] Case ID: [id number] - Rx #: [id number] Need help? Call us at [phone] Status Sent to [facility] Briviact 50MG tablets Form [facility] Electronic Prior Authorization Form (2017 NCPDP) Original Claim Info 75 [place] Required call [phone] Requires Prior AuthorizationLower Cost Alts: levetiracetam(generic Keppra)

## 2021-06-01 NOTE — Progress Notes (Signed)
Subjective:  Patient ID: [name], male    DOB: July 26, 1960,  MRN: [id number]  Chief Complaint  Patient presents with   Nail Problem     New Patient with systemic connective tissue disease scleroderma and significant dystrophic nails with peripheral vascular disesase Referring Provider: [name]    PVD    61 y.o. male presents with the above complaint. History confirmed with patient.  Nails are thickened elongated painful to have difficulty cutting them.  Objective:  Physical Exam: Feet are cool to touch but have good capillary refill, no trophic changes or ulcerative lesions, weakly palpable DP and PT pulses, and normal sensory exam. Left Foot: dystrophic yellowed discolored nail plates with subungual debris Right Foot: dystrophic yellowed discolored nail plates with subungual debris  Assessment:   1. Scleroderma ([place])   2. Pain due to onychomycosis of toenails of both feet      Plan:  Patient was evaluated and treated and all questions answered.  Discussed the etiology and treatment options for the condition in detail with the patient. Educated patient on the topical and oral treatment options for mycotic nails. Recommended debridement of the nails today. Sharp and mechanical debridement performed of all painful and mycotic nails today. Nails debrided in length and thickness using a nail nipper to level of comfort. Discussed treatment options including appropriate shoe gear. Follow up as needed for painful nails.    Return in about 3 months (around 08/28/2021) for nail trim.

## 2021-06-01 NOTE — Telephone Encounter (Signed)
Patient's wife called and said the pharmacy does not have the patient's new medication.  She's not sure what the name of the medicine is but said it was just prescribed yesterday.  She'd like to know if the patient can have some samples until the pharmacy gets it.  She was not sure why the pharmacy does not have it.

## 2021-06-02 ENCOUNTER — Telehealth: Payer: Self-pay | Admitting: *Deleted

## 2021-06-02 NOTE — Telephone Encounter (Signed)
[name] called to confirm EEG appointment for tomorrow. Pt's wife had a lot of questions about her husband's medication and said he had a really bad day yesterday. She needs to talk to Dr. Delice Lesch, I cannot answer questions about medications so I told [name] I will forward this call to Dr. Delice Lesch and let her decide if he can come in for his EEG tomorrow.

## 2021-06-02 NOTE — Telephone Encounter (Signed)
I spoke with Patient's wife and she said she does not need a phone call from you, her medication questions have cleared up and she will pick up sample tomorrow while here for this EEG appointment. She asked about prep for test and I answered those questions.

## 2021-06-02 NOTE — Telephone Encounter (Signed)
Ok to give samples, can you pls check if this is a supply issue or PA is needed, thank you!

## 2021-06-02 NOTE — Telephone Encounter (Signed)
F/u  Tracie Harrier Key: [facility] [place] Case ID: [id number] - Rx #: [id number] Need help? Call us at [phone] Outcome Approvedon February 8 Request Reference Number: [id number]. BRIVIACT TAB 50MG is approved through 06/01/2022. Your patient may now fill this prescription and it will be covered. Drug Briviact 50MG tablets Form [facility] Electronic Prior Authorization Form (2017 NCPDP) Original Claim Info 14 [place] Required call [phone] Requires Prior AuthorizationLower Cost Alts: levetiracetam(generic Keppra)

## 2021-06-02 NOTE — Telephone Encounter (Signed)
Pt called left a voice mail that the PA was done and approved so they should be able to get the medication at the pharmacy but if it was an issue of the pharmacy not having the medication to please call the office and let me know and we can give him samples until the pharmacy gets the medication in,

## 2021-06-02 NOTE — Telephone Encounter (Signed)
Pharmacy called they are ordering and getting pt medication ready for him today

## 2021-06-03 ENCOUNTER — Other Ambulatory Visit: Payer: Self-pay

## 2021-06-03 ENCOUNTER — Telehealth: Payer: Self-pay | Admitting: Neurology

## 2021-06-03 ENCOUNTER — Inpatient Hospital Stay (HOSPITAL_COMMUNITY): Payer: Medicare Other

## 2021-06-03 ENCOUNTER — Ambulatory Visit (INDEPENDENT_AMBULATORY_CARE_PROVIDER_SITE_OTHER): Payer: Medicare Other | Admitting: Neurology

## 2021-06-03 ENCOUNTER — Emergency Department (HOSPITAL_COMMUNITY): Payer: Medicare Other

## 2021-06-03 ENCOUNTER — Other Ambulatory Visit: Payer: Medicare Other

## 2021-06-03 ENCOUNTER — Encounter (HOSPITAL_COMMUNITY): Payer: Self-pay

## 2021-06-03 ENCOUNTER — Inpatient Hospital Stay (HOSPITAL_COMMUNITY)
Admission: EM | Admit: 2021-06-03 | Discharge: 2021-06-15 | DRG: 100 | Disposition: A | Discharge status: Home Health | Payer: Medicare Other | Attending: Internal Medicine | Admitting: Internal Medicine

## 2021-06-03 DIAGNOSIS — R413 Other amnesia: Secondary | ICD-10-CM | POA: Diagnosis present

## 2021-06-03 DIAGNOSIS — Z720 Tobacco use: Secondary | ICD-10-CM | POA: Diagnosis not present

## 2021-06-03 DIAGNOSIS — I493 Ventricular premature depolarization: Secondary | ICD-10-CM | POA: Diagnosis present

## 2021-06-03 DIAGNOSIS — R64 Cachexia: Secondary | ICD-10-CM | POA: Diagnosis present

## 2021-06-03 DIAGNOSIS — E875 Hyperkalemia: Secondary | ICD-10-CM | POA: Diagnosis present

## 2021-06-03 DIAGNOSIS — Z8619 Personal history of other infectious and parasitic diseases: Secondary | ICD-10-CM

## 2021-06-03 DIAGNOSIS — N184 Chronic kidney disease, stage 4 (severe): Secondary | ICD-10-CM | POA: Diagnosis present

## 2021-06-03 DIAGNOSIS — I2729 Other secondary pulmonary hypertension: Secondary | ICD-10-CM | POA: Diagnosis present

## 2021-06-03 DIAGNOSIS — Z4659 Encounter for fitting and adjustment of other gastrointestinal appliance and device: Secondary | ICD-10-CM

## 2021-06-03 DIAGNOSIS — F17210 Nicotine dependence, cigarettes, uncomplicated: Secondary | ICD-10-CM | POA: Diagnosis present

## 2021-06-03 DIAGNOSIS — J849 Interstitial pulmonary disease, unspecified: Secondary | ICD-10-CM | POA: Diagnosis present

## 2021-06-03 DIAGNOSIS — G40911 Epilepsy, unspecified, intractable, with status epilepticus: Principal | ICD-10-CM | POA: Diagnosis present

## 2021-06-03 DIAGNOSIS — R9401 Abnormal electroencephalogram [EEG]: Secondary | ICD-10-CM | POA: Diagnosis present

## 2021-06-03 DIAGNOSIS — I7300 Raynaud's syndrome without gangrene: Secondary | ICD-10-CM | POA: Diagnosis present

## 2021-06-03 DIAGNOSIS — E039 Hypothyroidism, unspecified: Secondary | ICD-10-CM | POA: Diagnosis present

## 2021-06-03 DIAGNOSIS — R569 Unspecified convulsions: Secondary | ICD-10-CM | POA: Diagnosis present

## 2021-06-03 DIAGNOSIS — I130 Hypertensive heart and chronic kidney disease with heart failure and stage 1 through stage 4 chronic kidney disease, or unspecified chronic kidney disease: Secondary | ICD-10-CM | POA: Diagnosis present

## 2021-06-03 DIAGNOSIS — Z09 Encounter for follow-up examination after completed treatment for conditions other than malignant neoplasm: Secondary | ICD-10-CM

## 2021-06-03 DIAGNOSIS — I5033 Acute on chronic diastolic (congestive) heart failure: Secondary | ICD-10-CM | POA: Diagnosis present

## 2021-06-03 DIAGNOSIS — G9389 Other specified disorders of brain: Secondary | ICD-10-CM | POA: Diagnosis present

## 2021-06-03 DIAGNOSIS — E872 Acidosis, unspecified: Secondary | ICD-10-CM | POA: Diagnosis present

## 2021-06-03 DIAGNOSIS — E782 Mixed hyperlipidemia: Secondary | ICD-10-CM

## 2021-06-03 DIAGNOSIS — R471 Dysarthria and anarthria: Secondary | ICD-10-CM | POA: Diagnosis present

## 2021-06-03 DIAGNOSIS — G928 Other toxic encephalopathy: Secondary | ICD-10-CM | POA: Diagnosis not present

## 2021-06-03 DIAGNOSIS — Z681 Body mass index (BMI) 19 or less, adult: Secondary | ICD-10-CM

## 2021-06-03 DIAGNOSIS — R4587 Impulsiveness: Secondary | ICD-10-CM | POA: Diagnosis present

## 2021-06-03 DIAGNOSIS — Z20822 Contact with and (suspected) exposure to covid-19: Secondary | ICD-10-CM | POA: Diagnosis present

## 2021-06-03 DIAGNOSIS — L8993 Pressure ulcer of unspecified site, stage 3: Secondary | ICD-10-CM | POA: Insufficient documentation

## 2021-06-03 DIAGNOSIS — E870 Hyperosmolality and hypernatremia: Secondary | ICD-10-CM | POA: Diagnosis present

## 2021-06-03 DIAGNOSIS — D631 Anemia in chronic kidney disease: Secondary | ICD-10-CM | POA: Diagnosis present

## 2021-06-03 DIAGNOSIS — I3139 Other pericardial effusion (noninflammatory): Secondary | ICD-10-CM | POA: Diagnosis not present

## 2021-06-03 DIAGNOSIS — Z91199 Patient's noncompliance with other medical treatment and regimen due to unspecified reason: Secondary | ICD-10-CM

## 2021-06-03 DIAGNOSIS — I272 Pulmonary hypertension, unspecified: Secondary | ICD-10-CM | POA: Diagnosis present

## 2021-06-03 DIAGNOSIS — Z955 Presence of coronary angioplasty implant and graft: Secondary | ICD-10-CM

## 2021-06-03 DIAGNOSIS — Z515 Encounter for palliative care: Secondary | ICD-10-CM

## 2021-06-03 DIAGNOSIS — I2510 Atherosclerotic heart disease of native coronary artery without angina pectoris: Secondary | ICD-10-CM | POA: Diagnosis present

## 2021-06-03 DIAGNOSIS — L8990 Pressure ulcer of unspecified site, unspecified stage: Secondary | ICD-10-CM | POA: Insufficient documentation

## 2021-06-03 DIAGNOSIS — E43 Unspecified severe protein-calorie malnutrition: Secondary | ICD-10-CM | POA: Diagnosis present

## 2021-06-03 DIAGNOSIS — E876 Hypokalemia: Secondary | ICD-10-CM | POA: Diagnosis present

## 2021-06-03 DIAGNOSIS — E162 Hypoglycemia, unspecified: Secondary | ICD-10-CM | POA: Diagnosis not present

## 2021-06-03 DIAGNOSIS — W06XXXA Fall from bed, initial encounter: Secondary | ICD-10-CM | POA: Diagnosis not present

## 2021-06-03 DIAGNOSIS — Z841 Family history of disorders of kidney and ureter: Secondary | ICD-10-CM

## 2021-06-03 DIAGNOSIS — E785 Hyperlipidemia, unspecified: Secondary | ICD-10-CM | POA: Diagnosis present

## 2021-06-03 DIAGNOSIS — L80 Vitiligo: Secondary | ICD-10-CM | POA: Diagnosis present

## 2021-06-03 DIAGNOSIS — I2781 Cor pulmonale (chronic): Secondary | ICD-10-CM | POA: Diagnosis present

## 2021-06-03 DIAGNOSIS — G4089 Other seizures: Secondary | ICD-10-CM | POA: Diagnosis present

## 2021-06-03 DIAGNOSIS — F419 Anxiety disorder, unspecified: Secondary | ICD-10-CM | POA: Diagnosis present

## 2021-06-03 DIAGNOSIS — R159 Full incontinence of feces: Secondary | ICD-10-CM | POA: Diagnosis present

## 2021-06-03 DIAGNOSIS — I5043 Acute on chronic combined systolic (congestive) and diastolic (congestive) heart failure: Secondary | ICD-10-CM

## 2021-06-03 DIAGNOSIS — Z832 Family history of diseases of the blood and blood-forming organs and certain disorders involving the immune mechanism: Secondary | ICD-10-CM

## 2021-06-03 DIAGNOSIS — Z809 Family history of malignant neoplasm, unspecified: Secondary | ICD-10-CM

## 2021-06-03 DIAGNOSIS — Z7189 Other specified counseling: Secondary | ICD-10-CM | POA: Diagnosis not present

## 2021-06-03 DIAGNOSIS — I252 Old myocardial infarction: Secondary | ICD-10-CM

## 2021-06-03 DIAGNOSIS — Z79899 Other long term (current) drug therapy: Secondary | ICD-10-CM

## 2021-06-03 HISTORY — DX: Unspecified convulsions: R56.9

## 2021-06-03 LAB — CBC WITH DIFFERENTIAL/PLATELET
Abs Immature Granulocytes: 0.04 10*3/uL (ref 0.00–0.07)
Basophils Absolute: 0 10*3/uL (ref 0.0–0.1)
Basophils Relative: 0 %
Eosinophils Absolute: 0 10*3/uL (ref 0.0–0.5)
Eosinophils Relative: 0 %
HCT: 28 % — ABNORMAL LOW (ref 39.0–52.0)
Hemoglobin: 8.1 g/dL — ABNORMAL LOW (ref 13.0–17.0)
Immature Granulocytes: 1 %
Lymphocytes Relative: 13 %
Lymphs Abs: 1 10*3/uL (ref 0.7–4.0)
MCH: 29.7 pg (ref 26.0–34.0)
MCHC: 28.9 g/dL — ABNORMAL LOW (ref 30.0–36.0)
MCV: 102.6 fL — ABNORMAL HIGH (ref 80.0–100.0)
Monocytes Absolute: 0.4 10*3/uL (ref 0.1–1.0)
Monocytes Relative: 6 %
Neutro Abs: 5.9 10*3/uL (ref 1.7–7.7)
Neutrophils Relative %: 80 %
Platelets: 212 10*3/uL (ref 150–400)
RBC: 2.73 MIL/uL — ABNORMAL LOW (ref 4.22–5.81)
RDW: 18.6 % — ABNORMAL HIGH (ref 11.5–15.5)
WBC: 7.3 10*3/uL (ref 4.0–10.5)
nRBC: 0 % (ref 0.0–0.2)

## 2021-06-03 LAB — I-STAT CHEM 8, ED
BUN: 25 mg/dL — ABNORMAL HIGH (ref 8–23)
Calcium, Ion: 0.86 mmol/L — CL (ref 1.15–1.40)
Chloride: 101 mmol/L (ref 98–111)
Creatinine, Ser: 2.6 mg/dL — ABNORMAL HIGH (ref 0.61–1.24)
Glucose, Bld: 109 mg/dL — ABNORMAL HIGH (ref 70–99)
HCT: 26 % — ABNORMAL LOW (ref 39.0–52.0)
Hemoglobin: 8.8 g/dL — ABNORMAL LOW (ref 13.0–17.0)
Potassium: 3.4 mmol/L — ABNORMAL LOW (ref 3.5–5.1)
Sodium: 137 mmol/L (ref 135–145)
TCO2: 19 mmol/L — ABNORMAL LOW (ref 22–32)

## 2021-06-03 LAB — COMPREHENSIVE METABOLIC PANEL
ALT: 24 U/L (ref 0–44)
AST: 44 U/L — ABNORMAL HIGH (ref 15–41)
Albumin: 2.3 g/dL — ABNORMAL LOW (ref 3.5–5.0)
Alkaline Phosphatase: 89 U/L (ref 38–126)
Anion gap: 21 — ABNORMAL HIGH (ref 5–15)
BUN: 26 mg/dL — ABNORMAL HIGH (ref 8–23)
CO2: 17 mmol/L — ABNORMAL LOW (ref 22–32)
Calcium: 6.9 mg/dL — ABNORMAL LOW (ref 8.9–10.3)
Chloride: 100 mmol/L (ref 98–111)
Creatinine, Ser: 2.85 mg/dL — ABNORMAL HIGH (ref 0.61–1.24)
GFR, Estimated: 24 mL/min — ABNORMAL LOW (ref >60)
Glucose, Bld: 114 mg/dL — ABNORMAL HIGH (ref 70–99)
Potassium: 3.4 mmol/L — ABNORMAL LOW (ref 3.5–5.1)
Sodium: 138 mmol/L (ref 135–145)
Total Bilirubin: 0.7 mg/dL (ref 0.3–1.2)
Total Protein: 6.1 g/dL — ABNORMAL LOW (ref 6.5–8.1)

## 2021-06-03 LAB — RESP PANEL BY RT-PCR (FLU A&B, COVID) ARPGX2
Influenza A by PCR: NEGATIVE
Influenza B by PCR: NEGATIVE
SARS Coronavirus 2 by RT PCR: NEGATIVE

## 2021-06-03 LAB — BRAIN NATRIURETIC PEPTIDE: B Natriuretic Peptide: 1173.8 pg/mL — ABNORMAL HIGH (ref 0.0–100.0)

## 2021-06-03 LAB — I-STAT ARTERIAL BLOOD GAS, ED
Acid-base deficit: 3 mmol/L — ABNORMAL HIGH (ref 0.0–2.0)
Bicarbonate: 22.4 mmol/L (ref 20.0–28.0)
Calcium, Ion: 0.95 mmol/L — ABNORMAL LOW (ref 1.15–1.40)
HCT: 25 % — ABNORMAL LOW (ref 39.0–52.0)
Hemoglobin: 8.5 g/dL — ABNORMAL LOW (ref 13.0–17.0)
O2 Saturation: 100 %
Patient temperature: 97.5
Potassium: 3.2 mmol/L — ABNORMAL LOW (ref 3.5–5.1)
Sodium: 139 mmol/L (ref 135–145)
TCO2: 24 mmol/L (ref 22–32)
pCO2 arterial: 41.3 mmHg (ref 32.0–48.0)
pH, Arterial: 7.34 — ABNORMAL LOW (ref 7.350–7.450)
pO2, Arterial: 293 mmHg — ABNORMAL HIGH (ref 83.0–108.0)

## 2021-06-03 LAB — MAGNESIUM: Magnesium: 1 mg/dL — ABNORMAL LOW (ref 1.7–2.4)

## 2021-06-03 LAB — CBG MONITORING, ED: Glucose-Capillary: 82 mg/dL (ref 70–99)

## 2021-06-03 IMAGING — DX DG CHEST 1V PORT
1 series · 1 of 1 positions shown · non-contrast
Comparison: [DATE], CT [DATE]

CLINICAL DATA: Possible pneumonia

EXAM:
PORTABLE CHEST 1 VIEW

[chest ap]
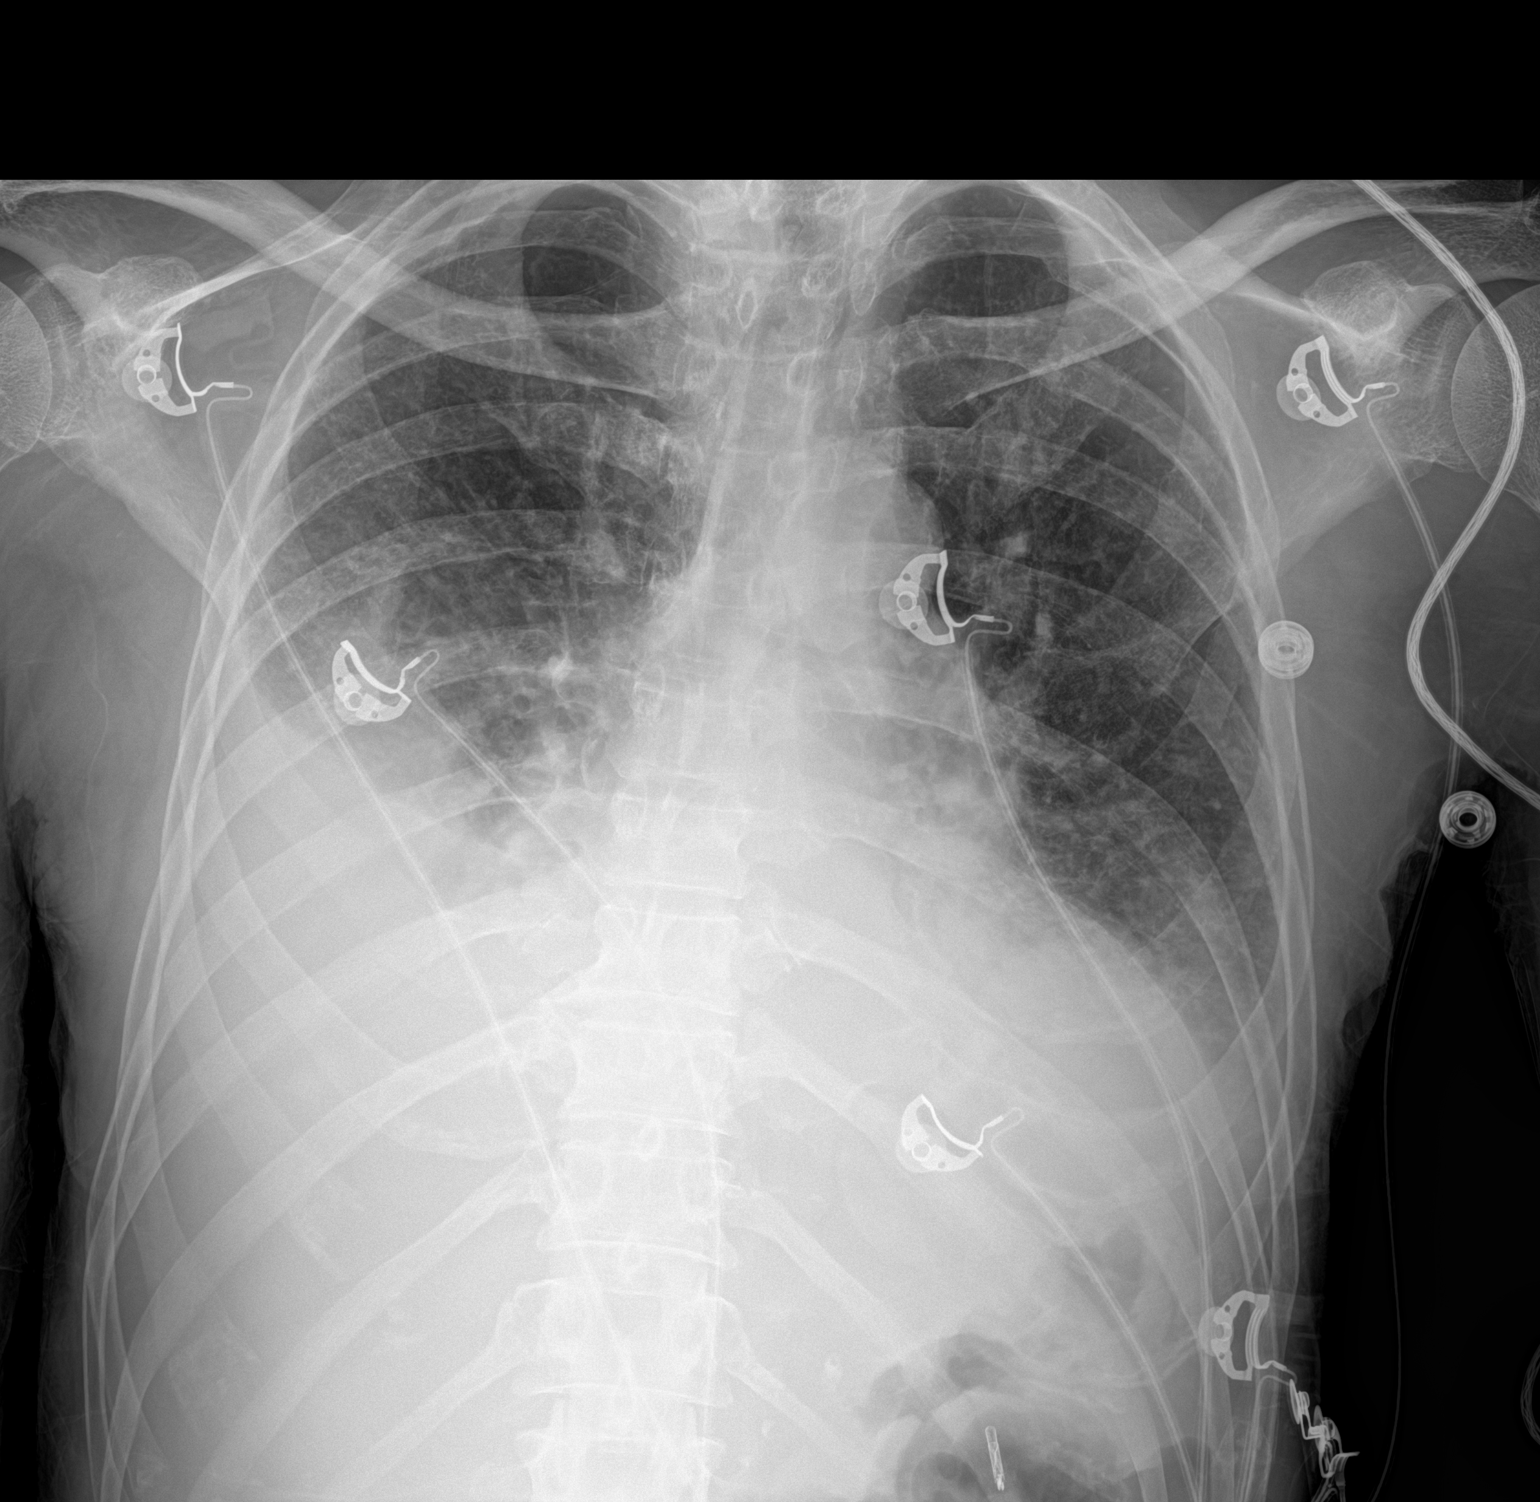

[1 of 1 positions shown; findings below may reference images not displayed]

FINDINGS: Moderate right pleural effusion and small left effusion, increased
compared to prior. Worsened airspace disease at the bases.
Cardiomegaly with vascular congestion and mild interstitial edema.
IMPRESSION: 1. Increased bilateral pleural effusions right greater than left,
moderate in size on the right. Worsened airspace disease at the
bases which may be due to atelectasis or pneumonia
2. Cardiomegaly with vascular congestion and mild interstitial edema

## 2021-06-03 IMAGING — MR MR HEAD W/O CM
6 series · 48 of 48 positions shown · non-contrast
Comparison: Prior CT from earlier the same day.

CLINICAL DATA: Initial evaluation for acute seizure.

EXAM:
MRI HEAD WITHOUT CONTRAST
TECHNIQUE: Multiplanar, multiecho pulse sequences of the brain and surrounding
structures were obtained without intravenous contrast.

[Series 5: DWI · axial · 3.0mm · 0.88mm/px · z∈[-69,+77]mm · 12 of 100 slices shown (1 of 6)]
[im 1/100]
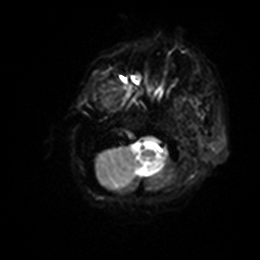
[im 10/100]
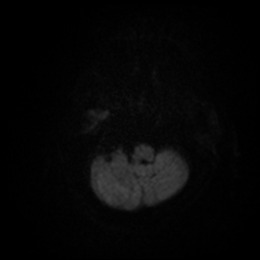
[im 19/100]
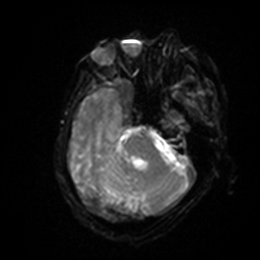
[im 28/100]
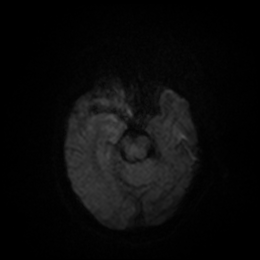
[im 37/100]
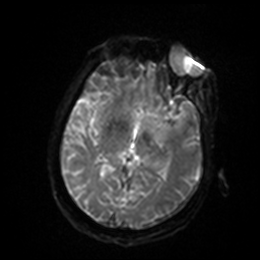
[im 46/100]
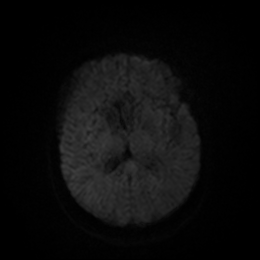
[im 55/100]
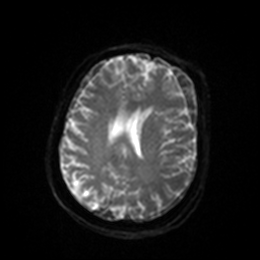
[im 64/100]
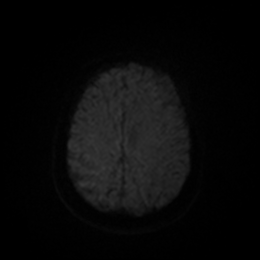
[im 73/100]
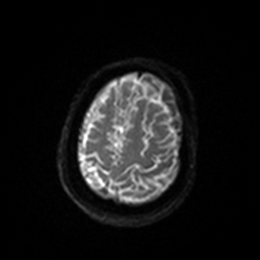
[im 82/100]
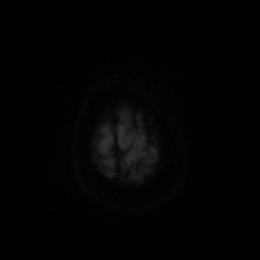
[im 91/100]
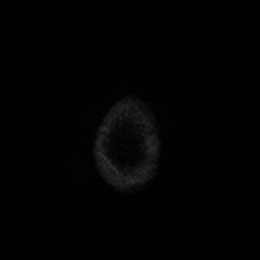
[im 100/100]
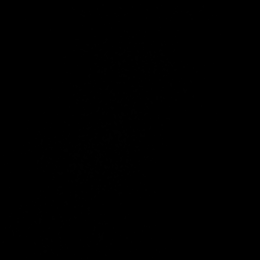

[Series 6: DWI · axial · 3.0mm · 0.88mm/px · z∈[-69,+68]mm · 6 of 47 slices shown (2 of 6)]
[im 1/47]
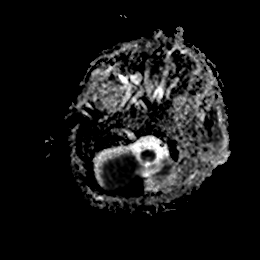
[im 10/47]
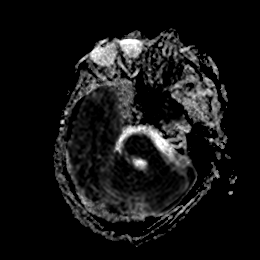
[im 19/47]
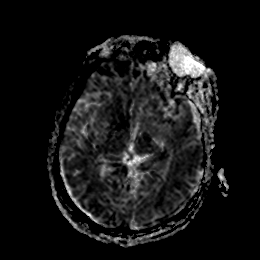
[im 28/47]
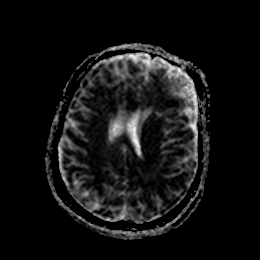
[im 37/47]
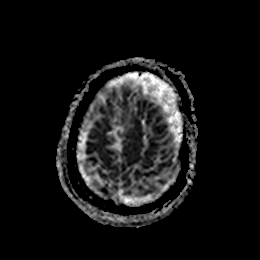
[im 47/47]
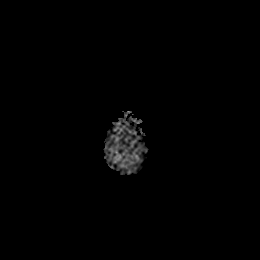

[Series 7: DWI · coronal · 4.0mm · 0.88mm/px · 8 of 64 slices shown (3 of 6)]
[im 1/64]
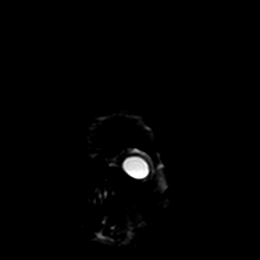
[im 10/64]
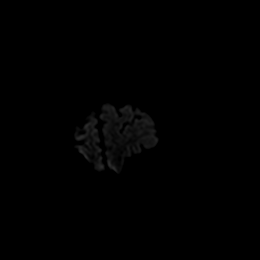
[im 19/64]
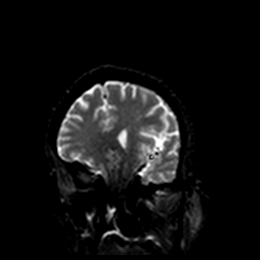
[im 28/64]
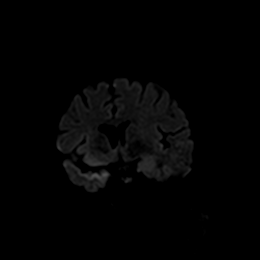
[im 37/64]
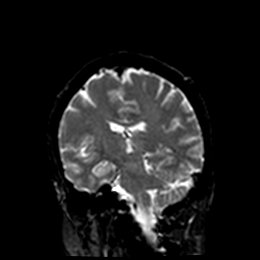
[im 46/64]
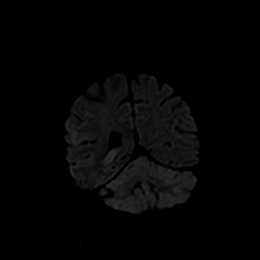
[im 55/64]
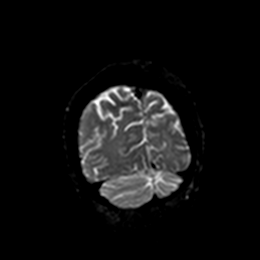
[im 64/64]
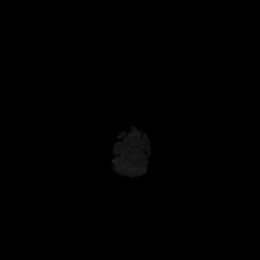

[Series 8: DWI · coronal · 4.0mm · 0.88mm/px · 4 of 32 slices shown (4 of 6)]
[im 1/32]
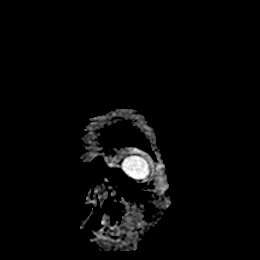
[im 11/32]
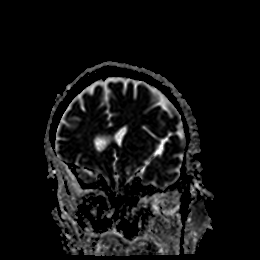
[im 21/32]
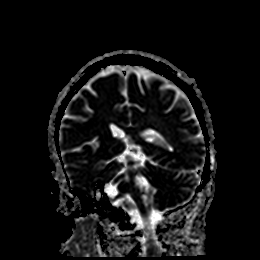
[im 32/32]
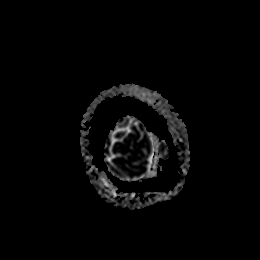

[Series 9: DWI · axial · 3.0mm · 0.88mm/px · z∈[-70,+77]mm · 12 of 100 slices shown (5 of 6)]
[im 1/100]
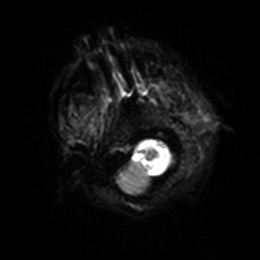
[im 10/100]
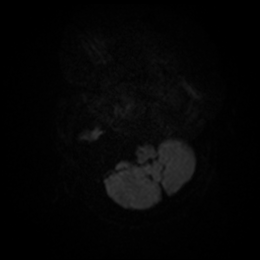
[im 19/100]
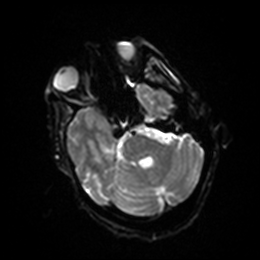
[im 28/100]
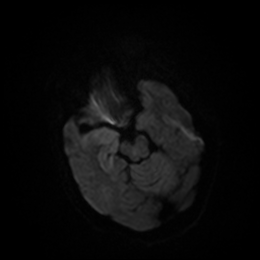
[im 37/100]
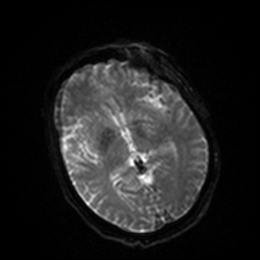
[im 46/100]
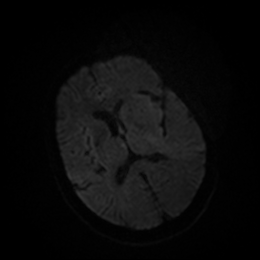
[im 55/100]
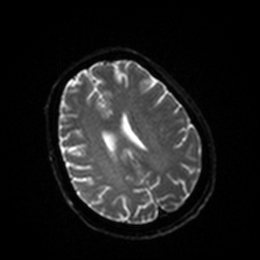
[im 64/100]
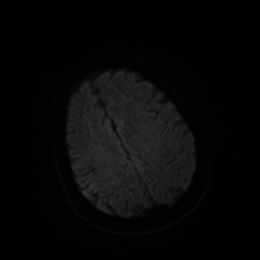
[im 73/100]
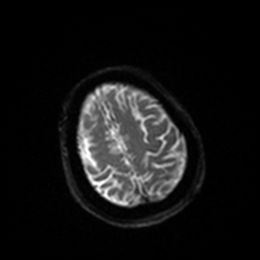
[im 82/100]
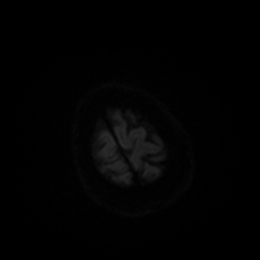
[im 91/100]
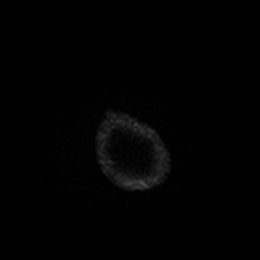
[im 100/100]
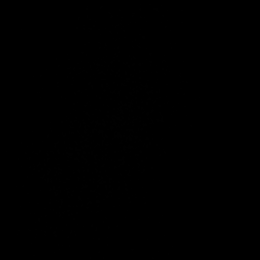

[Series 10: DWI · axial · 3.0mm · 0.88mm/px · z∈[-70,+71]mm · 6 of 48 slices shown (6 of 6)]
[im 1/48]
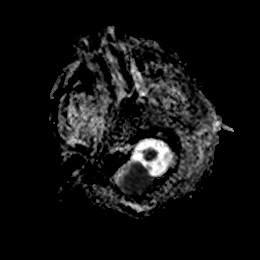
[im 10/48]
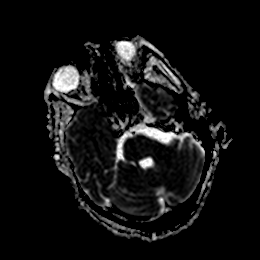
[im 19/48]
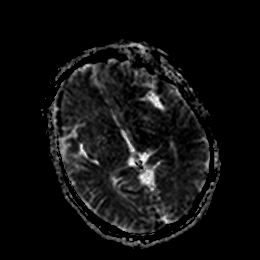
[im 29/48]
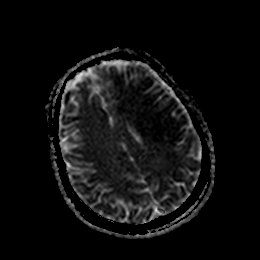
[im 38/48]
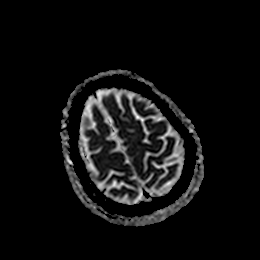
[im 48/48]
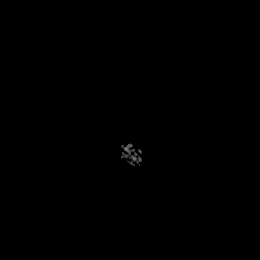

[48 of 48 positions shown; findings below may reference images not displayed]

FINDINGS: Brain: Examination technically limited as the patient was unable to
tolerate the full length of the exam. Axial coronal DWI sequences
only were performed. Additionally, the provided images are severely
degraded by motion artifact.

Diffusion-weighted imaging demonstrates increased diffusion signal
involving the mesial right temporal lobe/right hippocampal
formation, presumably related to acute seizure given provided
history. Possible patchy involvement of the right thalamus noted as
well (series 7, image 47).

Otherwise no other definite acute intracranial abnormality seen on
this technically limited exam. No convincing evidence for acute or
subacute infarct elsewhere. Gray-white matter differentiation
otherwise grossly maintained. No visible areas of encephalomalacia
to suggest prior infarction or other insult. No visible mass lesion
or mass effect. No midline shift or hydrocephalus. No extra-axial
fluid collection.

Vascular: Not assessed on this limited exam.

Skull and upper cervical spine: Not assessed on this limited exam.

Sinuses/Orbits: Not assessed on this limited exam.

Other: None.
IMPRESSION: 1. Technically limited exam due to the patient's inability to
tolerate the full length of the study and extensive motion artifact.
DWI imaging only was performed.
2. Increased diffusion signal involving the mesial right temporal
lobe/right hippocampal formation, presumably related to acute
seizure given provided history. Possible patchy involvement of the
right thalamus as well. Correlation with EEG recommended.
3. No other definite acute intracranial abnormality on this
technically limited exam.

## 2021-06-03 IMAGING — CT CT HEAD W/O CM
3 of 4 series · 16 of 47 positions shown, 19 images · non-contrast
Comparison: [DATE]

CLINICAL DATA: Mental status changes of unknown cause.  Seizure.



[Series 3: head 5.0 h30s · axial · 0.49mm/px · z∈[-146,+4]mm · 10 of 36 slices shown, 13 images]
[im 3/36  brain]
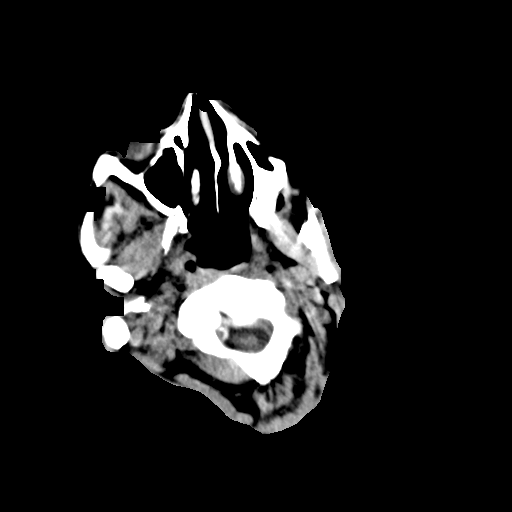
[im 3/36  bone]
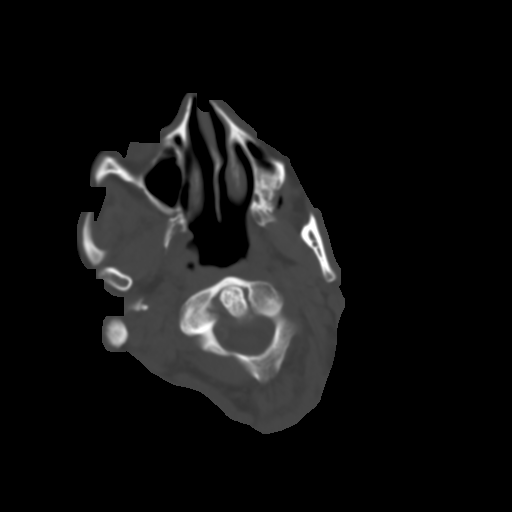
[im 6/36  brain]
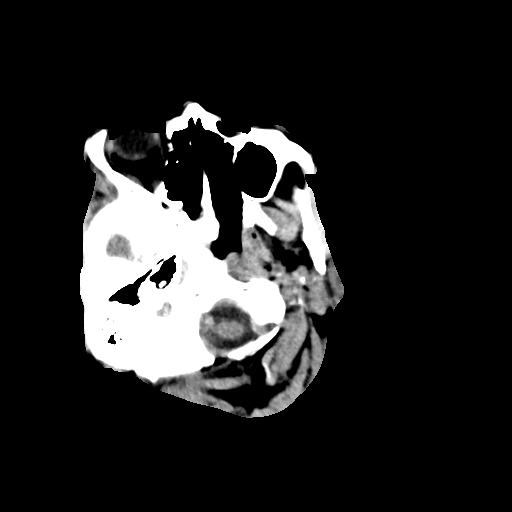
[im 11/36  brain]
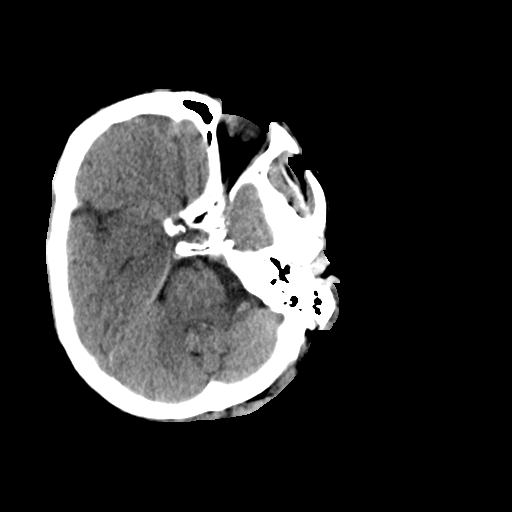
[im 13/36  brain]
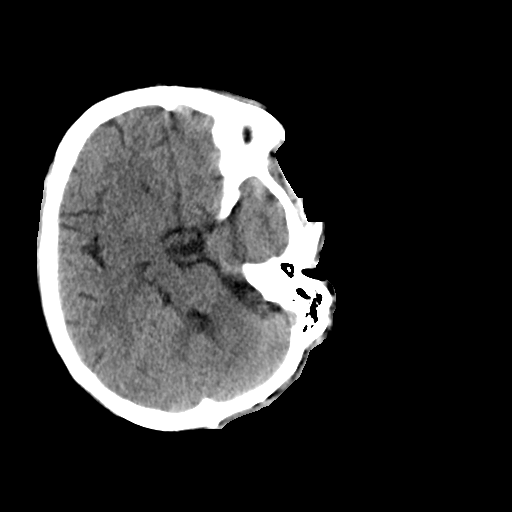
[im 16/36  brain]
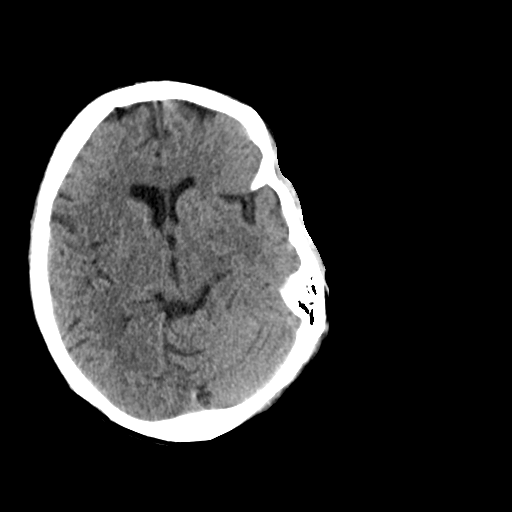
[im 16/36  bone]
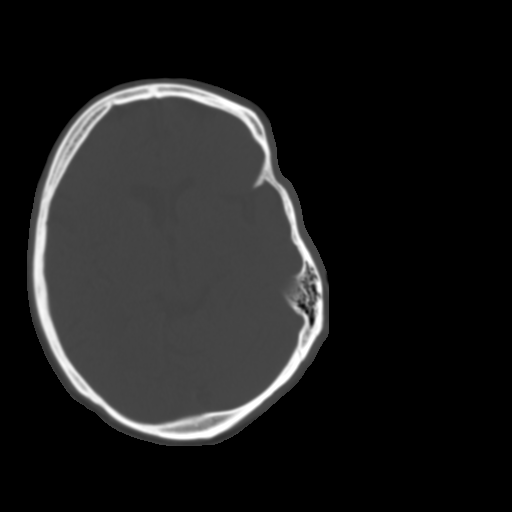
[im 21/36  brain]
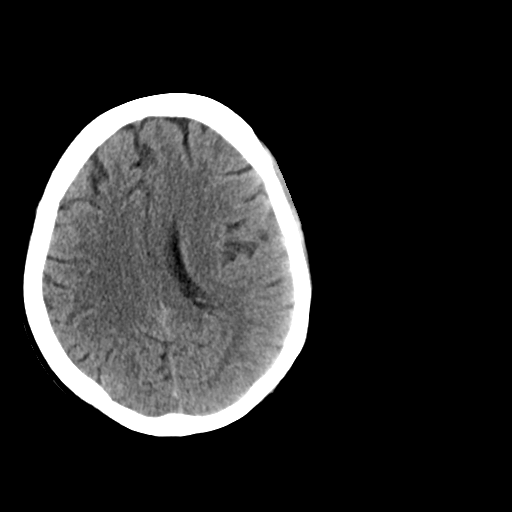
[im 23/36  brain]
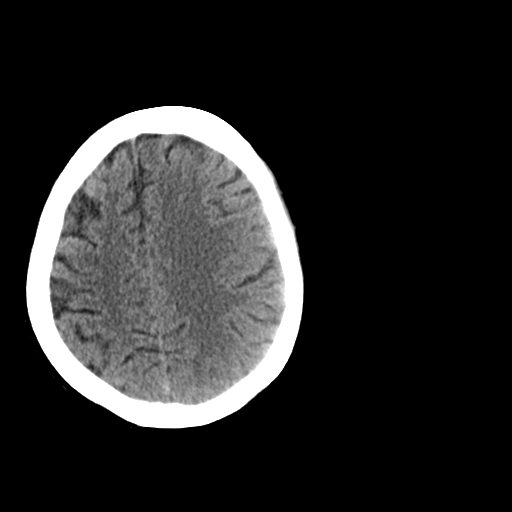
[im 26/36  brain]
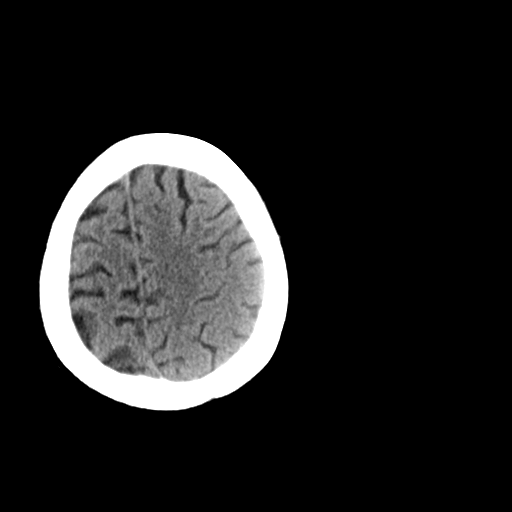
[im 31/36  brain]
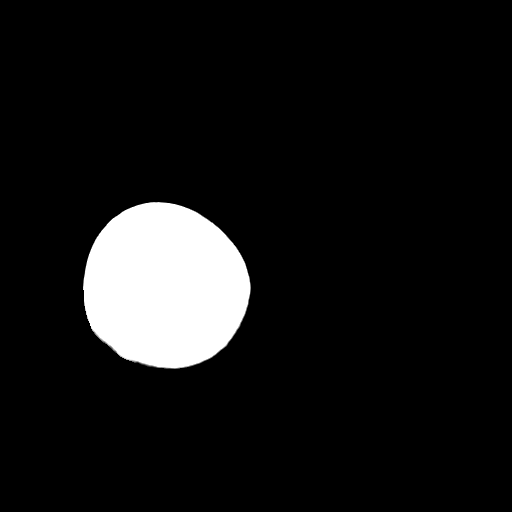
[im 31/36  bone]
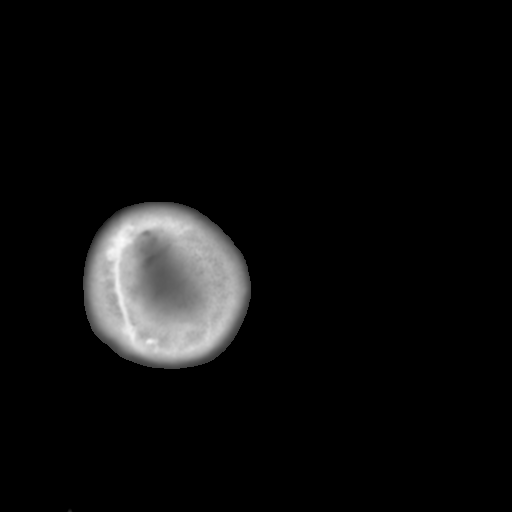
[im 33/36  brain]
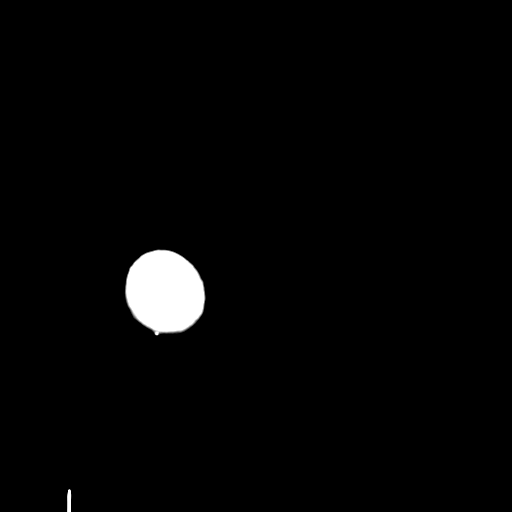

[Series 5: head 3.0 mpr cor · coronal · 0.36mm/px · 3 of 67 slices shown]
[im 23/67  brain]
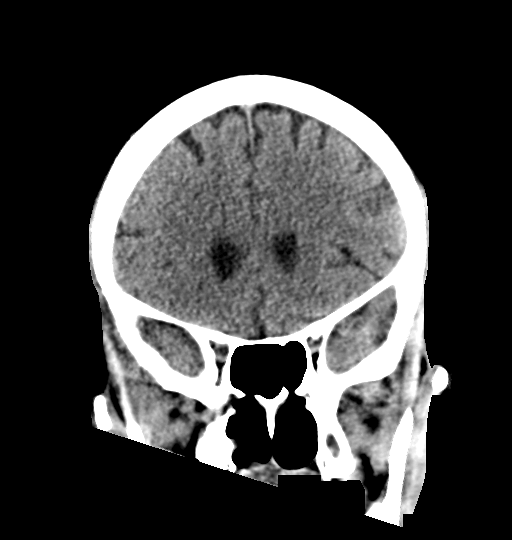
[im 30/67  brain]
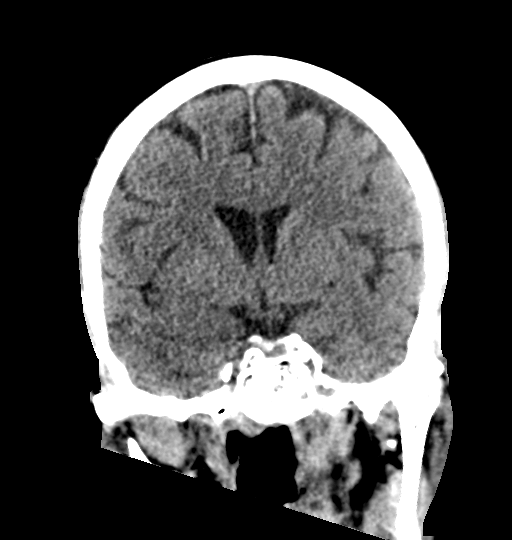
[im 37/67  brain]
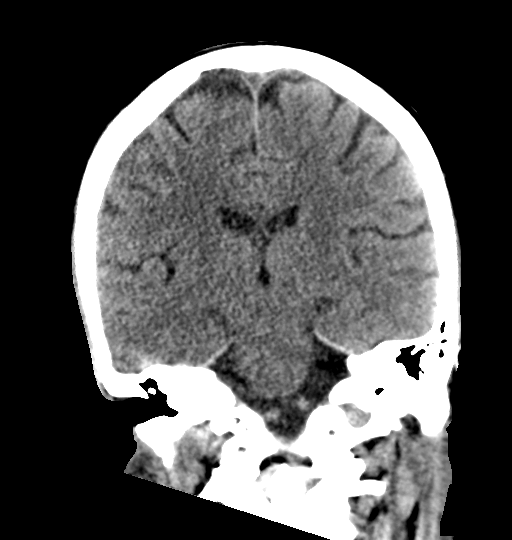

[Series 6: head 3.0 mpr sag · sagittal · 0.36mm/px · 3 of 57 slices shown]
[im 25/57  brain]
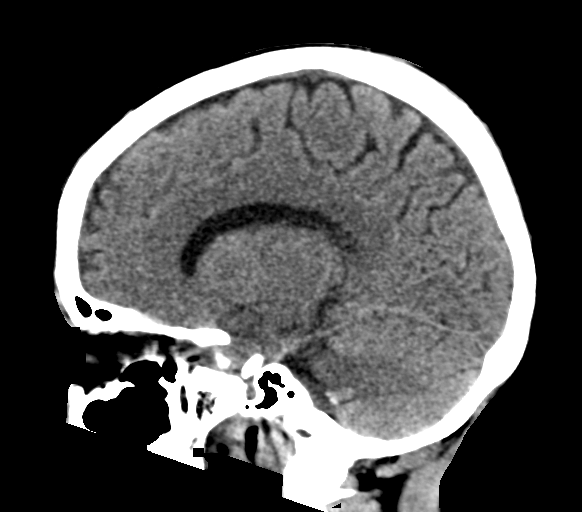
[im 29/57  brain]
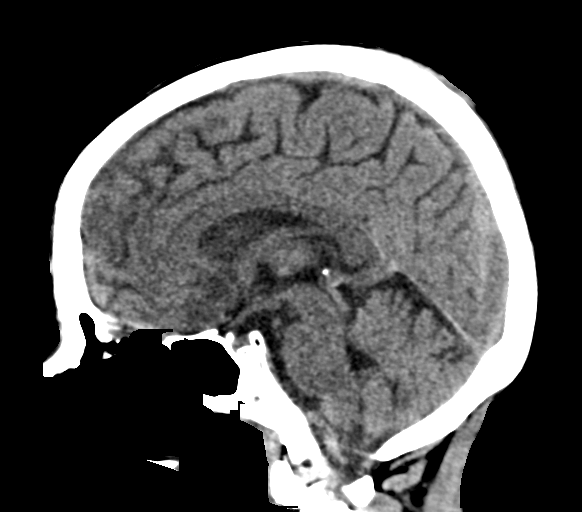
[im 33/57  brain]
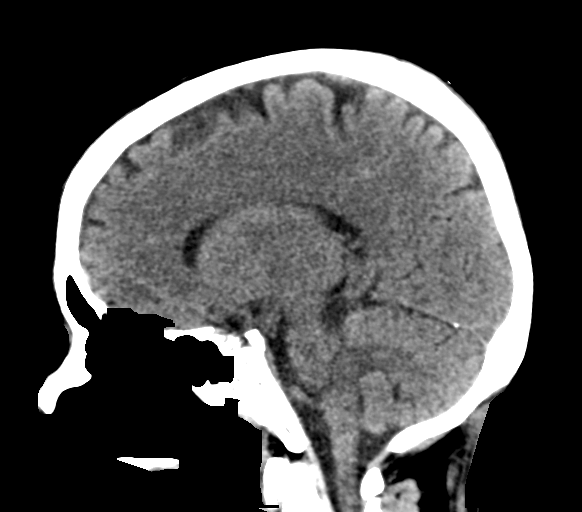

[16 of 47 positions shown; findings below may reference images not displayed]

FINDINGS: Brain: The head is asymmetrically positioned within the gantry.
Allowing for this, no change is seen compared to the study of 6 days
ago. Mild generalized volume loss without evidence of focal
infarction, mass lesion, hemorrhage, hydrocephalus or extra-axial
collection.

Vascular: There is atherosclerotic calcification of the major
vessels at the base of the brain.

Skull: Negative

Sinuses/Orbits: Clear/normal

Other: None
IMPRESSION: No change.  Mild volume loss.  No focal or acute finding.

## 2021-06-03 MED ORDER — CALCIUM GLUCONATE-NACL 2-0.675 GM/100ML-% IV SOLN
2.0000 g | Freq: Once | INTRAVENOUS | Status: AC
  Administered 2021-06-03: 2000 mg via INTRAVENOUS
  Filled 2021-06-03: qty 100

## 2021-06-03 MED ORDER — SODIUM CHLORIDE 0.9 % IV SOLN
75.0000 mL/h | INTRAVENOUS | Status: DC
  Administered 2021-06-03: 75 mL/h via INTRAVENOUS

## 2021-06-03 MED ORDER — CALCIUM CARBONATE ANTACID 500 MG PO CHEW
200.0000 mg | CHEWABLE_TABLET | Freq: Two times a day (BID) | ORAL | Status: DC
  Administered 2021-06-06 – 2021-06-12 (×11): 200 mg via ORAL
  Filled 2021-06-03 (×14): qty 1

## 2021-06-03 MED ORDER — BRIVARACETAM 25 MG PO TABS
50.0000 mg | ORAL_TABLET | Freq: Two times a day (BID) | ORAL | Status: DC
  Filled 2021-06-03: qty 2

## 2021-06-03 MED ORDER — POTASSIUM CHLORIDE CRYS ER 20 MEQ PO TBCR
20.0000 meq | EXTENDED_RELEASE_TABLET | Freq: Every day | ORAL | Status: DC
  Filled 2021-06-03: qty 1

## 2021-06-03 MED ORDER — ORAL CARE MOUTH RINSE
15.0000 mL | OROMUCOSAL | Status: DC
  Administered 2021-06-04 – 2021-06-13 (×54): 15 mL via OROMUCOSAL

## 2021-06-03 MED ORDER — MAGNESIUM SULFATE 2 GM/50ML IV SOLN
2.0000 g | Freq: Once | INTRAVENOUS | Status: AC
  Administered 2021-06-03: 2 g via INTRAVENOUS
  Filled 2021-06-03: qty 50

## 2021-06-03 MED ORDER — CHLORHEXIDINE GLUCONATE 0.12% ORAL RINSE (MEDLINE KIT)
15.0000 mL | Freq: Two times a day (BID) | OROMUCOSAL | Status: DC
  Administered 2021-06-05 – 2021-06-15 (×13): 15 mL via OROMUCOSAL

## 2021-06-03 MED ORDER — HEPARIN SODIUM (PORCINE) 5000 UNIT/ML IJ SOLN
5000.0000 [IU] | Freq: Three times a day (TID) | INTRAMUSCULAR | Status: DC
  Administered 2021-06-03 – 2021-06-05 (×5): 5000 [IU] via SUBCUTANEOUS
  Filled 2021-06-03 (×5): qty 1

## 2021-06-03 MED ORDER — LEVETIRACETAM IN NACL 1000 MG/100ML IV SOLN
2000.0000 mg | Freq: Once | INTRAVENOUS | Status: AC
  Administered 2021-06-03: 2000 mg via INTRAVENOUS

## 2021-06-03 MED ORDER — SODIUM BICARBONATE 650 MG PO TABS
1300.0000 mg | ORAL_TABLET | Freq: Three times a day (TID) | ORAL | Status: DC
  Filled 2021-06-03: qty 2

## 2021-06-03 MED ORDER — SODIUM CHLORIDE 0.9 % IV BOLUS
500.0000 mL | Freq: Once | INTRAVENOUS | Status: AC
  Administered 2021-06-03: 500 mL via INTRAVENOUS

## 2021-06-03 MED ORDER — POTASSIUM CHLORIDE 10 MEQ/100ML IV SOLN
10.0000 meq | Freq: Once | INTRAVENOUS | Status: AC
  Administered 2021-06-03: 10 meq via INTRAVENOUS
  Filled 2021-06-03: qty 100

## 2021-06-03 NOTE — Telephone Encounter (Signed)
Called pt wife to go over eeg results no answer left a voicemail to call office back

## 2021-06-03 NOTE — Progress Notes (Signed)
Pt unable to follow command to hold still, pt tried crawling out of MRI machine, unsafe to scan pt at this time. Pt confused. Unable to finish exam safely.

## 2021-06-03 NOTE — ED Notes (Signed)
Received verbal report from [place] B RN at this time

## 2021-06-03 NOTE — Telephone Encounter (Signed)
Agree, needs to go to ER for concern for focal status epilepticus with small seizures all day. His EEG today showed right temporal sharp waves and slowing, although no seizure captured on EEG, there were periodic discharges concerning for lateralized periodic discharges (LPDs).
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
## 2021-06-03 NOTE — Plan of Care (Signed)
Problem: Education: Goal: Knowledge of General Education information will improve Description: Including pain rating scale, medication(s)/side effects and non-pharmacologic comfort measures Outcome: Progressing  Per wife at bedside, patient appears more alert since admission.

## 2021-06-03 NOTE — Telephone Encounter (Signed)
Pt is in the ER

## 2021-06-03 NOTE — ED Notes (Signed)
Pt was incontinent of bowel and bladder. I cleaned pt and applied a clean brief.

## 2021-06-03 NOTE — Telephone Encounter (Signed)
Pt is had a seizure lasted about 2 minutes he urinated on his self he has been having small seizures off and on all day moaning he was having complaints of his left hand being numb, pt wife was advised to take him to the ER to be evaluated, she did  not want to go with the long wait she was told that they are not going to let him sit in the waiting room actively having a seizure that is an emergency they will treat him. Pt wife hung up the phone to call EMS to come get pt,

## 2021-06-03 NOTE — Telephone Encounter (Signed)
Patients wife called, pt hand is numb, he had a seizure, urinated on himself and hat not been his self since- transferred to [facility]

## 2021-06-03 NOTE — Progress Notes (Addendum)
The patient is not available for EEG at this time per nurse, will be going to MRI shortly. Tech will check back later for availability.

## 2021-06-03 NOTE — Procedures (Signed)
ELECTROENCEPHALOGRAM REPORT  Date of Study: 06/03/2021  Patient's Name: [name] MRN: [id number] Date of Birth: [birth year]  Referring Provider: Dr. Ellouise Newer  Clinical History: This is a 61 year old man with new onset seizure on 05/28/2021. EEG for classification.  Medications: ZYLOPRIM 100 MG tablet XANAX 0.5 MG tablet TUMS - DOSED IN MG ELEMENTAL CALCIUM 500 MG chewable tablet RETACRIT IJ NORCO 10-325 MG tablet OPSUMIT 10 MG tablet ROBAXIN 500 MG tablet CENTRUM chewable tablet PROTONIX 40 MG tablet KLOR-CON M20 20 MEQ tablet CRESTOR 20 MG tablet REVATIO 20 MG tabl0et sodium bicarbonate 650 MG tablet DEMADEX 20 MG tablet  Technical Summary: A multichannel digital 1-hour EEG recording measured by the international 10-20 system with electrodes applied with paste and impedances below 5000 ohms performed in our laboratory with EKG monitoring in an awake and drowsy patient.  Hyperventilation was not performed. Photic stimulation was performed.  The digital EEG was referentially recorded, reformatted, and digitally filtered in a variety of bipolar and referential montages for optimal display.    Description: The patient is awake and drowsy during the recording.  During maximal wakefulness, there is a symmetric, medium voltage 10 Hz posterior dominant rhythm better formed over the left occipital region. There is frequent focal 4-5 Hz theta slowing over the right temporal region. During drowsiness, there is an increase in theta slowing of the background. Sleep was not captured. Photic stimulation did not elicit any abnormalities.  There were frequent broad sharp waves over the right anterior temporal region, at times occurring in a pseudo-periodic pattern with no evolution in frequency or amplitude. No electrographic seizures seen.    EKG lead was unremarkable.  Impression: This 1-hour awake and drowsy EEG is abnormal due to the presence of: Focal slowing over the right temporal  region Frequent epileptiform discharges over the right anterior temporal region, at times occurring in a periodic pattern with no evolution in frequency or amplitude.  Clinical Correlation of the above findings indicates focal cerebral dysfunction over the right temproal region suggestive of underlying structural or physiologic abnormality.  There is tendency for seizures to arise from the right anterior temporal region. No electrographic seizures seen. Clinical correlation is advised.    Ellouise Newer, M.D.
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
## 2021-06-03 NOTE — ED Notes (Signed)
Patient transported to CT

## 2021-06-03 NOTE — ED Notes (Signed)
Unable to get Sa02. We have tried the fingers, but they are not neurovasc intact. We tried the forehead and the toes. MD is aware.

## 2021-06-03 NOTE — ED Triage Notes (Signed)
Pt arrived via [facility] from home for a seizure. Pt had first seizure ever on Sat and was brought here. Pt had 3 seizures today. First seizure witnessed by wife, second seizure witnessed by fire and third seizure witnessed by EMS. Per EMS, the pt had a grand mal seizure and it lasted 2 mins. EMS gave versed 109m IM. EMS placed a NPA in pt and put pt on NRB. Pt unresponsive at this time.

## 2021-06-04 ENCOUNTER — Encounter (HOSPITAL_COMMUNITY): Payer: 59

## 2021-06-04 ENCOUNTER — Inpatient Hospital Stay (HOSPITAL_COMMUNITY): Payer: Medicare Other

## 2021-06-04 DIAGNOSIS — I5033 Acute on chronic diastolic (congestive) heart failure: Secondary | ICD-10-CM | POA: Diagnosis not present

## 2021-06-04 DIAGNOSIS — L8990 Pressure ulcer of unspecified site, unspecified stage: Secondary | ICD-10-CM | POA: Insufficient documentation

## 2021-06-04 DIAGNOSIS — I5043 Acute on chronic combined systolic (congestive) and diastolic (congestive) heart failure: Secondary | ICD-10-CM

## 2021-06-04 DIAGNOSIS — N184 Chronic kidney disease, stage 4 (severe): Secondary | ICD-10-CM | POA: Diagnosis not present

## 2021-06-04 DIAGNOSIS — L8993 Pressure ulcer of unspecified site, stage 3: Secondary | ICD-10-CM | POA: Insufficient documentation

## 2021-06-04 DIAGNOSIS — R569 Unspecified convulsions: Secondary | ICD-10-CM | POA: Diagnosis not present

## 2021-06-04 DIAGNOSIS — E782 Mixed hyperlipidemia: Secondary | ICD-10-CM | POA: Diagnosis not present

## 2021-06-04 LAB — GLUCOSE, CAPILLARY
Glucose-Capillary: 17 mg/dL — CL (ref 70–99)
Glucose-Capillary: 195 mg/dL — ABNORMAL HIGH (ref 70–99)
Glucose-Capillary: 87 mg/dL (ref 70–99)
Glucose-Capillary: 86 mg/dL (ref 70–99)

## 2021-06-04 LAB — HEPATIC FUNCTION PANEL
ALT: 21 U/L (ref 0–44)
AST: 32 U/L (ref 15–41)
Albumin: 2.2 g/dL — ABNORMAL LOW (ref 3.5–5.0)
Alkaline Phosphatase: 77 U/L (ref 38–126)
Bilirubin, Direct: 0.1 mg/dL (ref 0.0–0.2)
Indirect Bilirubin: 0.4 mg/dL (ref 0.3–0.9)
Total Bilirubin: 0.5 mg/dL (ref 0.3–1.2)
Total Protein: 5.8 g/dL — ABNORMAL LOW (ref 6.5–8.1)

## 2021-06-04 LAB — BASIC METABOLIC PANEL
Anion gap: 15 (ref 5–15)
BUN: 24 mg/dL — ABNORMAL HIGH (ref 8–23)
CO2: 22 mmol/L (ref 22–32)
Calcium: 7.6 mg/dL — ABNORMAL LOW (ref 8.9–10.3)
Chloride: 103 mmol/L (ref 98–111)
Creatinine, Ser: 2.71 mg/dL — ABNORMAL HIGH (ref 0.61–1.24)
GFR, Estimated: 26 mL/min — ABNORMAL LOW (ref >60)
Glucose, Bld: 57 mg/dL — ABNORMAL LOW (ref 70–99)
Potassium: 3.5 mmol/L (ref 3.5–5.1)
Sodium: 140 mmol/L (ref 135–145)

## 2021-06-04 LAB — PHOSPHORUS: Phosphorus: 4.2 mg/dL (ref 2.5–4.6)

## 2021-06-04 LAB — MAGNESIUM: Magnesium: 1.6 mg/dL — ABNORMAL LOW (ref 1.7–2.4)

## 2021-06-04 IMAGING — MR MR HEAD W/O CM
4 of 6 series · 30 of 48 positions shown · non-contrast
Comparison: [DATE] MRI and CT.

CLINICAL DATA: New onset seizure.  Altered mental status.

EXAM:
MRI HEAD WITHOUT CONTRAST
TECHNIQUE: Multiplanar, multiecho pulse sequences of the brain and surrounding
structures were obtained without intravenous contrast.

[Series 2: DWI · axial · 3.0mm · 0.94mm/px · z∈[-82,+57]mm · 11 of 108 slices shown (1 of 2)]
[im 7/108]
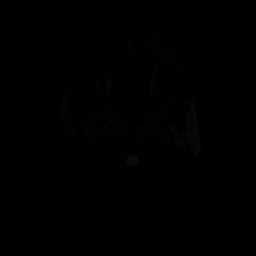
[im 14/108]
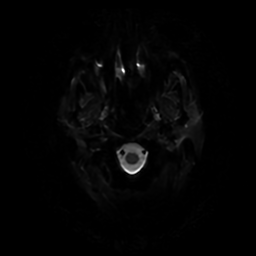
[im 21/108]
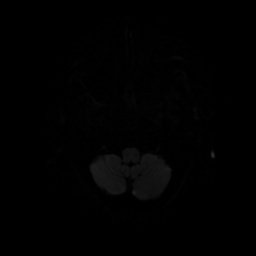
[im 34/108]
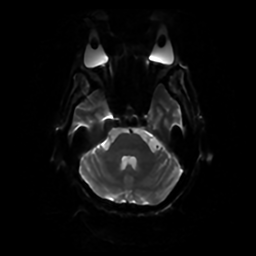
[im 47/108]
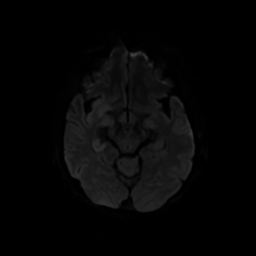
[im 54/108]
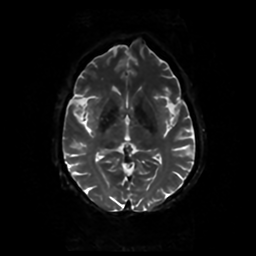
[im 61/108]
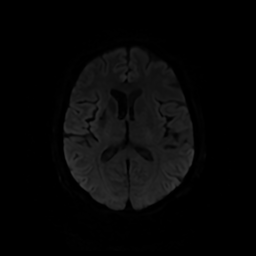
[im 74/108]
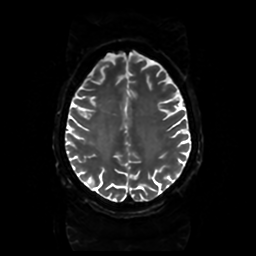
[im 87/108]
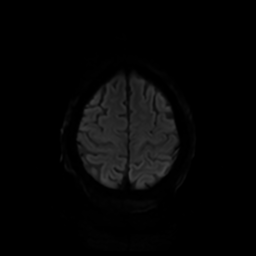
[im 94/108]
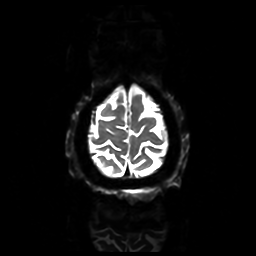
[im 101/108]
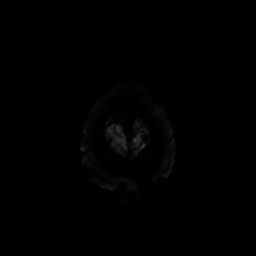

[Series 3: DWI · coronal · 4.0mm · 0.94mm/px · 11 of 72 slices shown (2 of 2)]
[im 1/72]
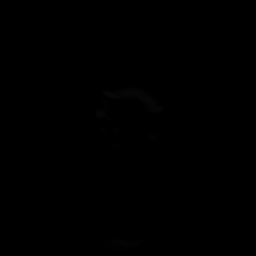
[im 8/72]
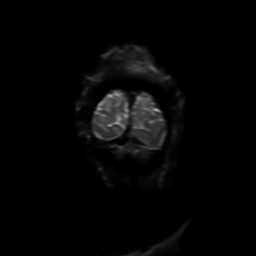
[im 15/72]
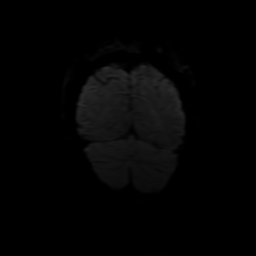
[im 22/72]
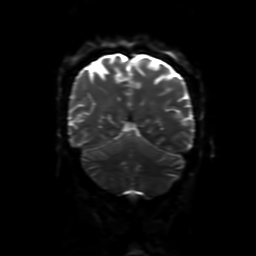
[im 29/72]
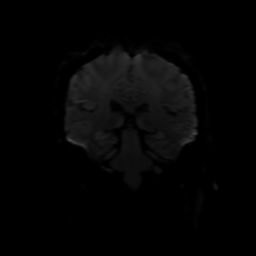
[im 36/72]
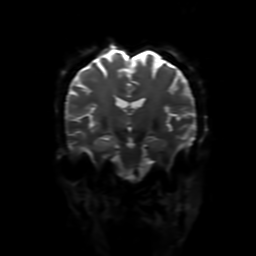
[im 43/72]
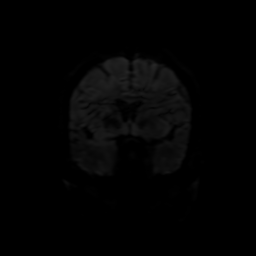
[im 50/72]
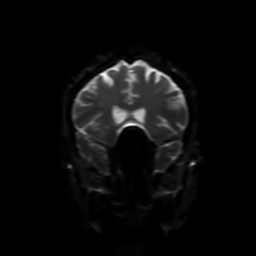
[im 57/72]
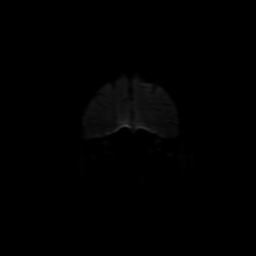
[im 64/72]
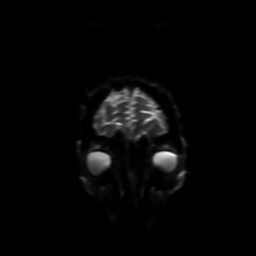
[im 72/72]
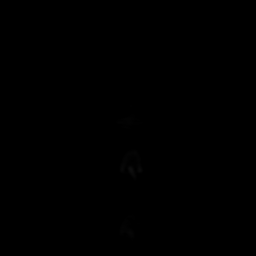

[Series 4: FLAIR · sagittal · 5.0mm · 0.23mm/px · 3 of 23 slices shown]
[im 1/23]
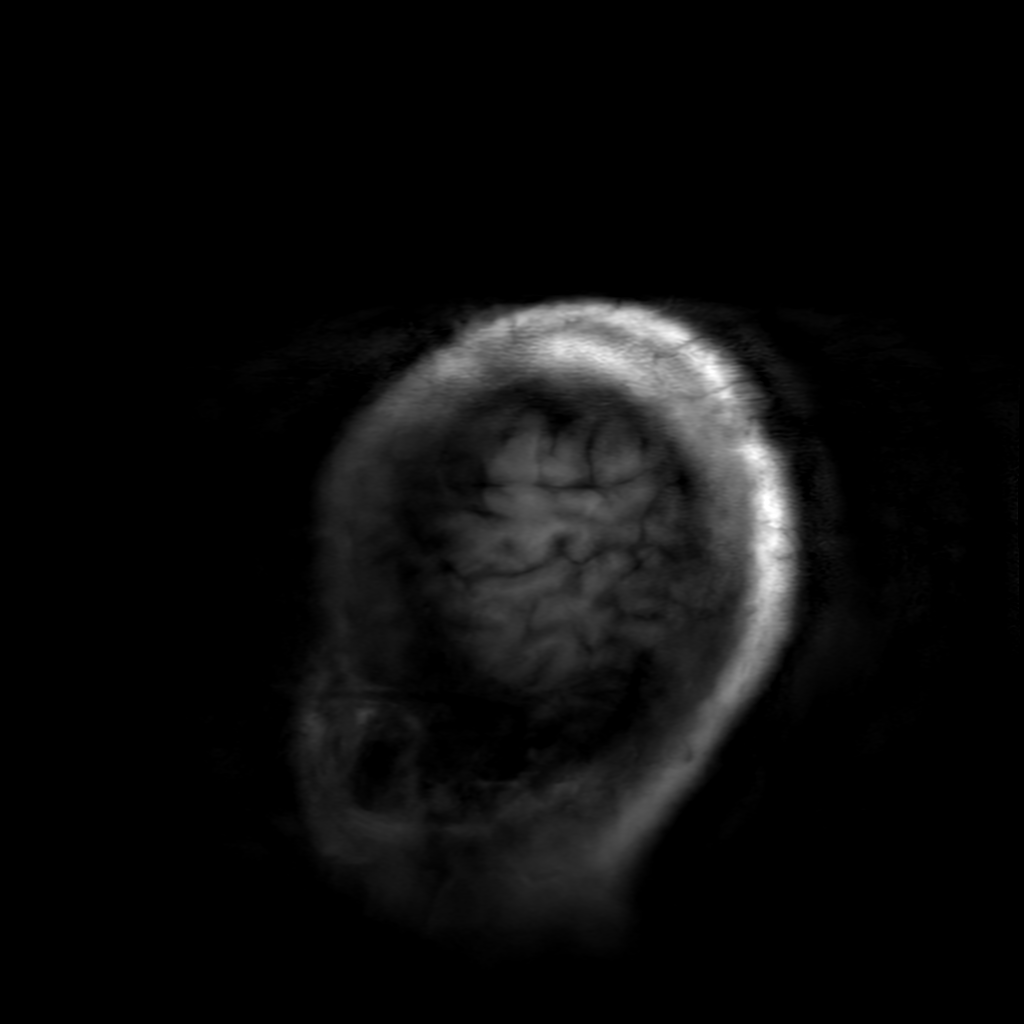
[im 12/23]
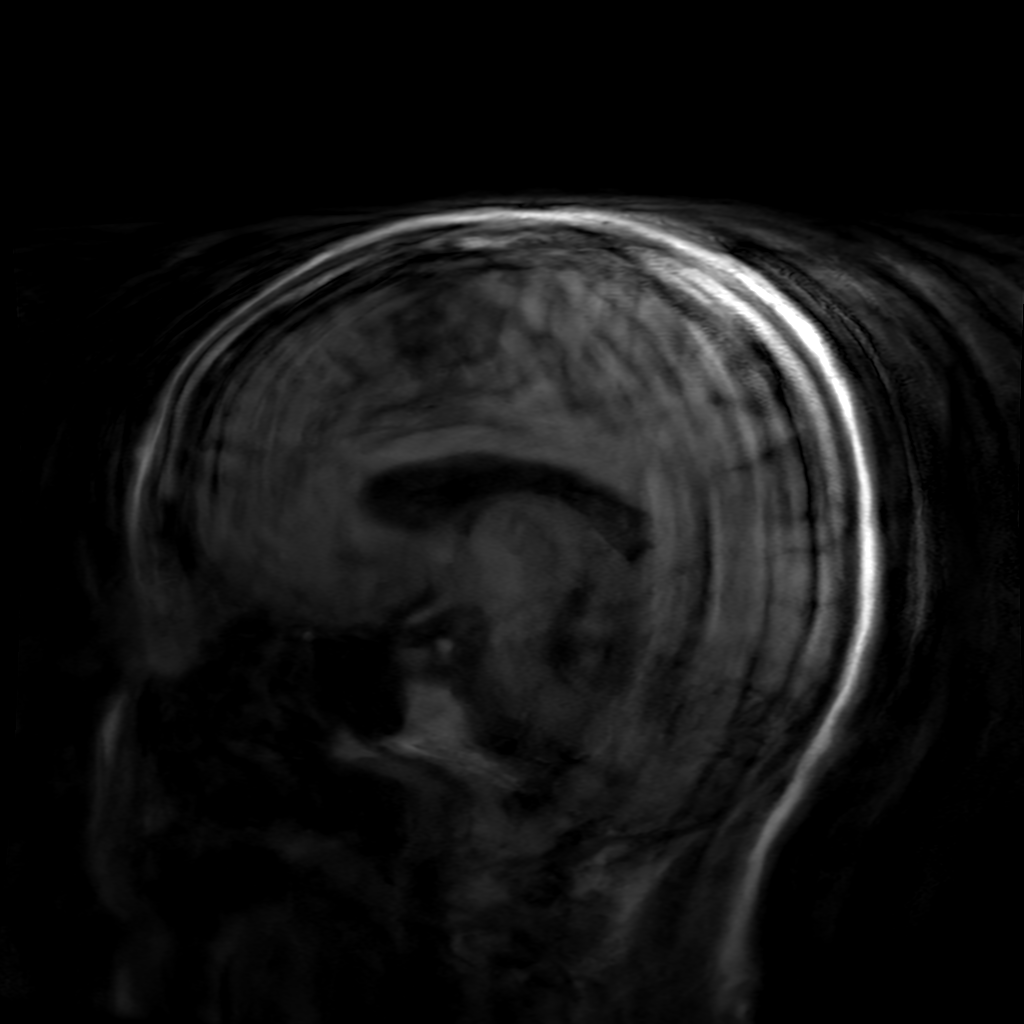
[im 23/23]
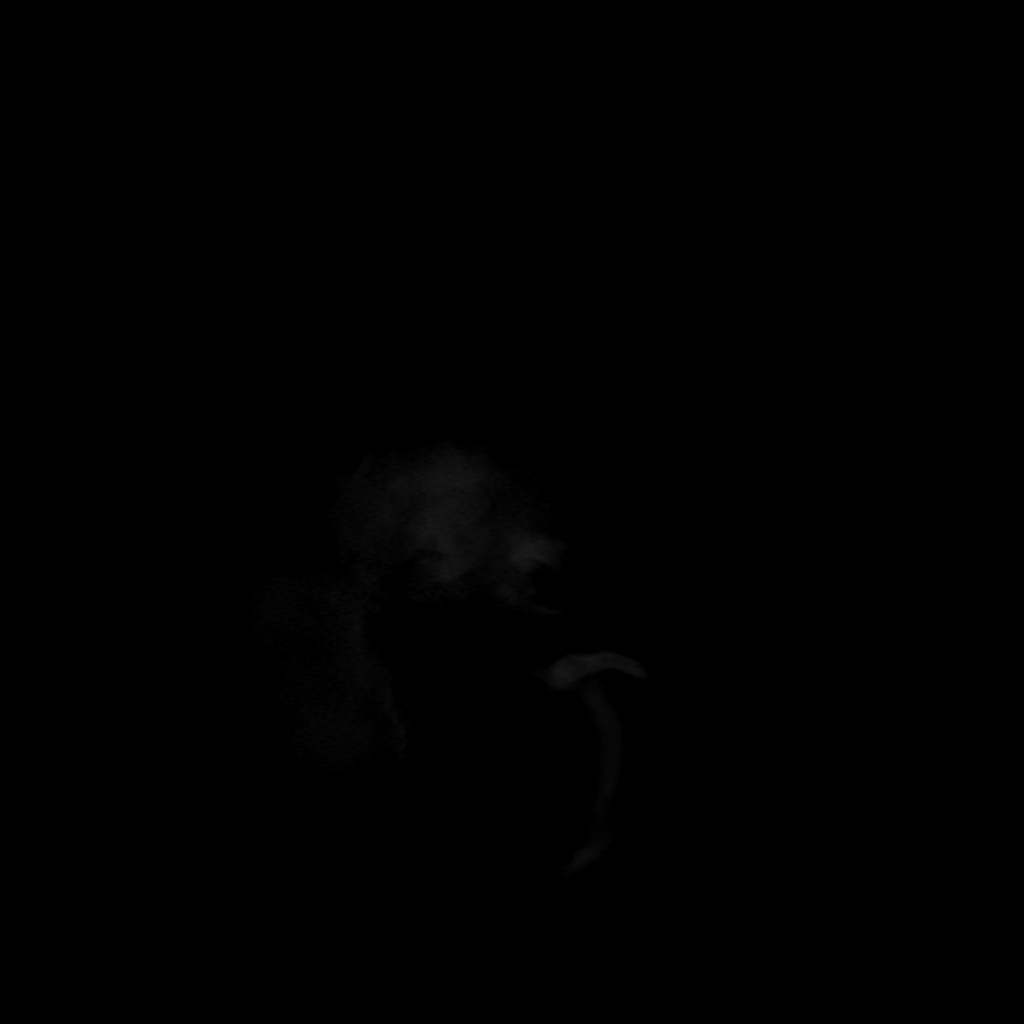

[Series 250: ADC · axial · 3.0mm · 0.94mm/px · z∈[-90,+42]mm · 5 of 53 slices shown]
[im 1/53]
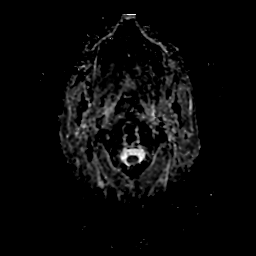
[im 8/53]
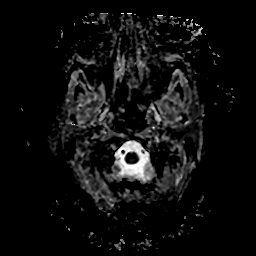
[im 15/53]
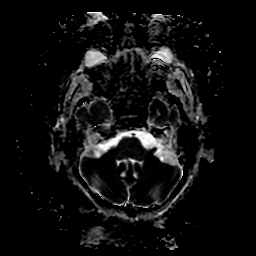
[im 30/53]
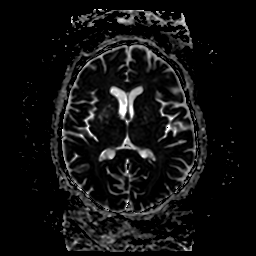
[im 45/53]
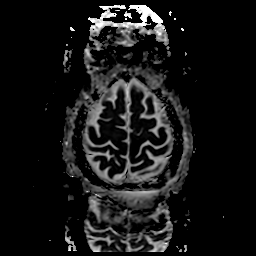

[30 of 48 positions shown; findings below may reference images not displayed]

FINDINGS: Brain: Again the study is limited by patient confusion and motion.
The study confirms brain swelling and increased T2 signal without
definite true restricted diffusion within the mesial right temporal
lobe, right hippocampus and right mesial thalamus. Findings are
presumed to be postictal rather than subsequent to ischemic
infarction. Otherwise, the brain appears normal. No evidence of old
infarction, mass, hydrocephalus or extra-axial collection. There may
be a developmental venous anomaly in the right basal ganglia and
radiating white matter tracts.

Vascular: Major vessels at the base of the brain show flow.

Skull and upper cervical spine: Negative

Sinuses/Orbits: Clear/negative

Other: None
IMPRESSION: Limited and motion degraded examination. Redemonstration of
increased T2 signal affecting the right mesial temporal lobe,
hippocampus and right mesial thalamus, presumed to be postictal
change. I do not see low signal on the ADC map to suggest actual
brain infarction.

Question developmental venous anomaly in the right basal ganglia and
radiating white matter tracts.

## 2021-06-04 MED ORDER — LORAZEPAM 2 MG/ML IJ SOLN
2.0000 mg | INTRAMUSCULAR | Status: AC
  Administered 2021-06-04: 2 mg via INTRAVENOUS

## 2021-06-04 MED ORDER — LORAZEPAM 2 MG/ML IJ SOLN: 1.0000 mg | INTRAMUSCULAR | Status: DC | PRN

## 2021-06-04 MED ORDER — PHENYTOIN SODIUM 50 MG/ML IJ SOLN
100.0000 mg | Freq: Three times a day (TID) | INTRAMUSCULAR | Status: DC
  Administered 2021-06-04 – 2021-06-07 (×9): 100 mg via INTRAVENOUS
  Filled 2021-06-04 (×9): qty 2

## 2021-06-04 MED ORDER — MAGNESIUM SULFATE IN D5W 1-5 GM/100ML-% IV SOLN
1.0000 g | Freq: Once | INTRAVENOUS | Status: DC
  Filled 2021-06-04: qty 100

## 2021-06-04 MED ORDER — SODIUM CHLORIDE 0.9 % IV SOLN
20.0000 mg/kg | Freq: Once | INTRAVENOUS | Status: AC
  Administered 2021-06-04: 1044 mg via INTRAVENOUS
  Filled 2021-06-04: qty 20.88

## 2021-06-04 MED ORDER — MACITENTAN 10 MG PO TABS: 10.0000 mg | ORAL_TABLET | Freq: Every day | ORAL | Status: DC

## 2021-06-04 MED ORDER — DEXTROSE 50 % IV SOLN
INTRAVENOUS | Status: AC
  Administered 2021-06-04: 50 mL
  Filled 2021-06-04: qty 50

## 2021-06-04 MED ORDER — ROSUVASTATIN CALCIUM 20 MG PO TABS
20.0000 mg | ORAL_TABLET | Freq: Every day | ORAL | Status: DC
  Filled 2021-06-04 (×2): qty 1

## 2021-06-04 MED ORDER — DIAZEPAM 5 MG/ML IJ SOLN
5.0000 mg | Freq: Once | INTRAMUSCULAR | Status: AC
  Administered 2021-06-04: 5 mg via INTRAVENOUS
  Filled 2021-06-04: qty 2

## 2021-06-04 MED ORDER — LORAZEPAM 2 MG/ML IJ SOLN
INTRAMUSCULAR | Status: AC
  Filled 2021-06-04: qty 1

## 2021-06-04 MED ORDER — LORAZEPAM 2 MG/ML IJ SOLN
1.0000 mg | INTRAMUSCULAR | Status: AC | PRN
  Administered 2021-06-04 – 2021-06-05 (×2): 1 mg via INTRAVENOUS
  Filled 2021-06-04 (×2): qty 1

## 2021-06-04 MED ORDER — MACITENTAN 10 MG PO TABS
10.0000 mg | ORAL_TABLET | Freq: Every day | ORAL | Status: DC
  Administered 2021-06-08 – 2021-06-15 (×6): 10 mg via ORAL
  Filled 2021-06-04 (×12): qty 1

## 2021-06-04 MED ORDER — CLONAZEPAM 0.5 MG PO TABS
0.5000 mg | ORAL_TABLET | Freq: Two times a day (BID) | ORAL | Status: DC
  Filled 2021-06-04: qty 1

## 2021-06-04 MED ORDER — FUROSEMIDE 10 MG/ML IJ SOLN
60.0000 mg | Freq: Every day | INTRAMUSCULAR | Status: DC
  Administered 2021-06-04 – 2021-06-05 (×2): 60 mg via INTRAVENOUS
  Filled 2021-06-04 (×2): qty 8

## 2021-06-04 MED ORDER — MAGNESIUM SULFATE 2 GM/50ML IV SOLN
2.0000 g | Freq: Once | INTRAVENOUS | Status: AC
  Administered 2021-06-04: 2 g via INTRAVENOUS
  Filled 2021-06-04: qty 50

## 2021-06-04 MED ORDER — SODIUM CHLORIDE 0.9 % IV SOLN
50.0000 mg | Freq: Once | INTRAVENOUS | Status: AC
  Administered 2021-06-04: 50 mg via INTRAVENOUS
  Filled 2021-06-04: qty 5

## 2021-06-04 MED ORDER — BRIVARACETAM 25 MG PO TABS
100.0000 mg | ORAL_TABLET | Freq: Two times a day (BID) | ORAL | Status: DC
  Filled 2021-06-04: qty 4

## 2021-06-04 NOTE — Progress Notes (Signed)
[facility]                                                                               [name], is a 61 y.o. male, DOB - November 10, 1960, DVV:[id number] Admit date - 06/03/2021    Outpatient Primary MD for the patient is [name], Ravisankar, MD  LOS - 1  days    Brief summary   [name] is a 61 y.o. male with medical history significant of CAD, CHF, hypertension, CKD stage IV, Raynard's, GI bleed, recent history of seizures who presents with recurrent seizures.   Assessment & Plan    Assessment and Plan:  Recurrent seizures:  Continues to have tonic clonic seizures.  - seen by neurology and is on continuous EEG monitoring.  - probably triggered by electrolyte imbalance in the setting of baseline seizure activity.  - resume Briviact 100 mg BID. IV fosphenytoin and keppra given.  - MRI brain without contrast.      Hypomagnesemia:  Replaced and repeat in am.    Mild acute on Chronic diastolic heart failure: On 3 lit of [place] oxygen.  CXR showed moderate right pleural effusion .  On IV lasix 60 mg daily and potassium supplementation.  Continue to monitor.  Repeat echocardiogram.    Recent C diff colitis:  Appears to have resolved.  Pt denies any abd pain, diarrhea.    Hyperlipidemia:  On statin which is on hold.    Hypertension:  Well controlled.    Stage 4 CKD:  Creatinine so far appears to be at baseline.  Creatinine at 2.7.  Continue to monitor while on IV lasix.    Inview of recurrent admissions , deconditioning and debility, palliative care consulted for goals of care discussions.    RN Pressure Injury Documentation: Pressure Injury 06/03/21 Sacrum Mid Stage 2 -  Partial thickness loss of dermis presenting as a shallow open injury with a red, pink wound bed without slough. (Active)  06/03/21 1654  Location: Sacrum  Location Orientation: Mid  Staging: Stage 2 -  Partial thickness loss of dermis presenting as a shallow open  injury with a red, pink wound bed without slough.  Wound Description (Comments):   Present on Admission: Yes      Estimated body mass index is 16.99 kg/m as calculated from the following:   Height as of this encounter: _0  (1.753 m).   Weight as of this encounter: 52.2 kg.  Code Status: full code.  DVT Prophylaxis:  heparin injection 5,000 Units Start: 06/03/21 2200   Level of Care: Level of care: Progressive Family Communication: Updated patient's family at bedside.   Disposition Plan:     Remains inpatient appropriate:  recurrent seizures.    Procedures:  EEG.   Consultants:   Neurology.  Palliative. Care   Antimicrobials:   Anti-infectives (From admission, onward)    None        Medications  Scheduled Meds:  brivaracetam  100 mg Oral BID   calcium carbonate  200 mg of elemental calcium Oral BID   chlorhexidine gluconate (MEDLINE KIT)  15 mL Mouth Rinse BID   furosemide  60 mg Intravenous Daily  heparin  5,000 Units Subcutaneous Q8H   macitentan  10 mg Oral Daily   mouth rinse  15 mL Mouth Rinse 10 times per day   phenytoin (DILANTIN) IV  100 mg Intravenous Q8H   potassium chloride SA  20 mEq Oral Daily   rosuvastatin  20 mg Oral Daily   Continuous Infusions:  brivaracetam (BRIVIACT) IVPB     PRN Meds:.LORazepam    Subjective:   [name] was seen and examined today.  Rn reports that pt choked on food. Will get SLP eval  Objective:   Vitals:   06/04/21 0300 06/04/21 0500 06/04/21 0800 06/04/21 1151  BP: 130/72  (!) 147/83 129/78  Pulse: (!) 102   (!) 125  Resp: _0 Temp: 98.6 F (37 C)  98.9 F (37.2 C) 100.2 F (37.9 C)  TempSrc: Oral   Oral  SpO2: 98%     Weight:      Height:        Intake/Output Summary (Last 24 hours) at 06/04/2021 1427 Last data filed at 06/04/2021 0600 Gross per 24 hour  Intake 3041.28 ml  Output 0 ml  Net 3041.28 ml   Filed Weights   06/03/21 1626  Weight: 52.2 kg     Exam General: ,  NAD elderly gentleman, on continuous EEG.  Cardiovascular: S1 S2 auscultated, no murmurs, RRR Respiratory: Clear to auscultation bilaterally, no wheezing, rales or rhonchi Gastrointestinal: Soft, nontender, nondistended, + bowel sounds Ext: no pedal edema bilaterally Neuro: AA Oriented to person only.  Skin: No rashes Psych: unable to assess.    Data Reviewed:  I have personally reviewed following labs and imaging studies   CBC Lab Results  Component Value Date   WBC 7.3 06/03/2021   RBC 2.73 (L) 06/03/2021   HGB 8.8 (L) 06/03/2021   HCT 26.0 (L) 06/03/2021   MCV 102.6 (H) 06/03/2021   MCH 29.7 06/03/2021   PLT 212 06/03/2021   MCHC 28.9 (L) 06/03/2021   RDW 18.6 (H) 06/03/2021   LYMPHSABS 1.0 06/03/2021   MONOABS 0.4 06/03/2021   EOSABS 0.0 06/03/2021   BASOSABS 0.0 70/17/7939     Last metabolic panel Lab Results  Component Value Date   NA 140 06/04/2021   K 3.5 06/04/2021   CL 103 06/04/2021   CO2 22 06/04/2021   BUN 24 (H) 06/04/2021   CREATININE 2.71 (H) 06/04/2021   GLUCOSE 57 (L) 06/04/2021   GFRNONAA 26 (L) 06/04/2021   GFRAA 31 (L) 01/02/2020   CALCIUM 7.6 (L) 06/04/2021   PHOS 4.2 06/04/2021   PROT 5.8 (L) 06/04/2021   ALBUMIN 2.2 (L) 06/04/2021   BILITOT 0.5 06/04/2021   ALKPHOS 77 06/04/2021   AST 32 06/04/2021   ALT 21 06/04/2021   ANIONGAP 15 06/04/2021    CBG (last 3)  Recent Labs    06/03/21 1619 06/04/21 1223 06/04/21 1250  GLUCAP 82 17* 195*      Coagulation Profile: No results for input(s): INR, PROTIME in the last 168 hours.   Radiology Studies: CT Head Wo Contrast  Result Date: 06/03/2021 CLINICAL DATA:  Mental status changes of unknown cause.  Seizure. EXAM: CT HEAD WITHOUT CONTRAST TECHNIQUE: Contiguous axial images were obtained from the base of the skull through the vertex without intravenous contrast. RADIATION DOSE REDUCTION: This exam was performed according to the departmental dose-optimization program which includes  automated exposure control, adjustment of the mA and/or kV according to patient size and/or use of iterative reconstruction technique.  COMPARISON:  05/28/2021 FINDINGS: Brain: The head is asymmetrically positioned within the gantry. Allowing for this, no change is seen compared to the study of 6 days ago. Mild generalized volume loss without evidence of focal infarction, mass lesion, hemorrhage, hydrocephalus or extra-axial collection. Vascular: There is atherosclerotic calcification of the major vessels at the base of the brain. Skull: Negative Sinuses/Orbits: Clear/normal Other: None IMPRESSION: No change.  Mild volume loss.  No focal or acute finding. Electronically Signed   By: Nelson Chimes M.D.   On: 06/03/2021 17:17   MR BRAIN WO CONTRAST  Result Date: 06/03/2021 CLINICAL DATA:  Initial evaluation for acute seizure. EXAM: MRI HEAD WITHOUT CONTRAST TECHNIQUE: Multiplanar, multiecho pulse sequences of the brain and surrounding structures were obtained without intravenous contrast. COMPARISON:  Prior CT from earlier the same day. FINDINGS: Brain: Examination technically limited as the patient was unable to tolerate the full length of the exam. Axial coronal DWI sequences only were performed. Additionally, the provided images are severely degraded by motion artifact. Diffusion-weighted imaging demonstrates increased diffusion signal involving the mesial right temporal lobe/right hippocampal formation, presumably related to acute seizure given provided history. Possible patchy involvement of the right thalamus noted as well (series 7, image 47). Otherwise no other definite acute intracranial abnormality seen on this technically limited exam. No convincing evidence for acute or subacute infarct elsewhere. Gray-white matter differentiation otherwise grossly maintained. No visible areas of encephalomalacia to suggest prior infarction or other insult. No visible mass lesion or mass effect. No midline shift or  hydrocephalus. No extra-axial fluid collection. Vascular: Not assessed on this limited exam. Skull and upper cervical spine: Not assessed on this limited exam. Sinuses/Orbits: Not assessed on this limited exam. Other: None. IMPRESSION: 1. Technically limited exam due to the patient's inability to tolerate the full length of the study and extensive motion artifact. DWI imaging only was performed. 2. Increased diffusion signal involving the mesial right temporal lobe/right hippocampal formation, presumably related to acute seizure given provided history. Possible patchy involvement of the right thalamus as well. Correlation with EEG recommended. 3. No other definite acute intracranial abnormality on this technically limited exam. Electronically Signed   By: Jeannine Boga M.D.   On: 06/03/2021 23:23   DG Chest Portable 1 View  Result Date: 06/03/2021 CLINICAL DATA:  Possible pneumonia EXAM: PORTABLE CHEST 1 VIEW COMPARISON:  05/20/2021, CT 02/20/2021 FINDINGS: Moderate right pleural effusion and small left effusion, increased compared to prior. Worsened airspace disease at the bases. Cardiomegaly with vascular congestion and mild interstitial edema. IMPRESSION: 1. Increased bilateral pleural effusions right greater than left, moderate in size on the right. Worsened airspace disease at the bases which may be due to atelectasis or pneumonia 2. Cardiomegaly with vascular congestion and mild interstitial edema Electronically Signed   By: Donavan Foil M.D.   On: 06/03/2021 16:48   EEG adult  Result Date: 06/04/2021 Robyne Peers, MD     06/04/2021  8:40 AM [facility] Routine EEG Report Date and Time of Study: 06/04/21 at 0145 Duration: 24 minutes Indication: Altered mentation. Technical Summary: A routine 20-channel electroencephalogram using the international 10-20 system of electrode placement was performed. Background: The best posterior dominant rhythm (PDR)  identified was 8 Hz in the left hemisphere. The background consisted predominantly of mild theta slowing (7-8 Hz) in the left hemisphere, and focal theta and delta (2-6 Hz) slowing in the right hemisphere. The background was reactive to stimulation. There were abundant right hemisphere sharp waves, frontal  predominant, with intermittent periodicity (i.e. lateralized periodic discharges, LPDs). There was one event of focal right hemisphere evolution, consistent with seizure, lasting approximately 1 minute, and with clinical shivering/twitchiness. States: Awake and drowsy. Definitive sleep structures were not seen. Activation Procedures: Hyperventilation: Not performed Photic Stimulation: Not performed Classification: abnormal due to: 1. Diffuse theta slowing. 2. Additional focal right hemisphere delta and theta slowing. 3. Abundant right hemisphere sharp waves, frontal predominant, with intermittent periodicity (i.e. right lateralized periodic discharges, LPDs) up to 1 Hz. 4. One focal right hemisphere electroclinical seizure. Diagnosis: and Clinical Correlation: This is an abnormal EEG. The presence of mild diffuse slowing is consistent with diffuse cortical dysfunction and mild non-specific encephalopathy. The additional focal right hemisphere slowing is consistent with additional focal neuronal injury/dysfunction in that region. Right hemisphere, frontal predominant, and LPDs are consistent with focal epileptic potential in that region.  Indeed, there was one event of focal right hemisphere evolution, consistent with seizure, lasting approximately 1 minute, and with clinical shivering/twitchiness seen during the period of time covered by this report. Allena Earing, MD CNP/Epilepsy [facility] [phone] Case: [id number]  EEG adult  Result Date: 06/03/2021 Cameron Sprang, MD     06/03/2021  3:16 PM ELECTROENCEPHALOGRAM REPORT Date of Study: 06/03/2021 Patient's Name: [name] MRN: [id number]  Date of Birth: 11-Apr-1961 Referring Provider: Dr. Ellouise Newer Clinical History: This is a 61 year old man with new onset seizure on 05/28/2021. EEG for classification. Medications: ZYLOPRIM 100 MG tablet XANAX 0.5 MG tablet TUMS - DOSED IN MG ELEMENTAL CALCIUM 500 MG chewable tablet RETACRIT IJ NORCO 10-325 MG tablet OPSUMIT 10 MG tablet ROBAXIN 500 MG tablet CENTRUM chewable tablet PROTONIX 40 MG tablet KLOR-CON M20 20 MEQ tablet CRESTOR 20 MG tablet REVATIO 20 MG tabl0et sodium bicarbonate 650 MG tablet DEMADEX 20 MG tablet Technical Summary: A multichannel digital 1-hour EEG recording measured by the international 10-20 system with electrodes applied with paste and impedances below 5000 ohms performed in our laboratory with EKG monitoring in an awake and drowsy patient.  Hyperventilation was not performed. Photic stimulation was performed.  The digital EEG was referentially recorded, reformatted, and digitally filtered in a variety of bipolar and referential montages for optimal display.  Description: The patient is awake and drowsy during the recording.  During maximal wakefulness, there is a symmetric, medium voltage 10 Hz posterior dominant rhythm better formed over the left occipital region. There is frequent focal 4-5 Hz theta slowing over the right temporal region. During drowsiness, there is an increase in theta slowing of the background. Sleep was not captured. Photic stimulation did not elicit any abnormalities.  There were frequent broad sharp waves over the right anterior temporal region, at times occurring in a pseudo-periodic pattern with no evolution in frequency or amplitude. No electrographic seizures seen.  EKG lead was unremarkable. Impression: This 1-hour awake and drowsy EEG is abnormal due to the presence of: Focal slowing over the right temporal region Frequent epileptiform discharges over the right anterior temporal region, at times occurring in a periodic pattern with no evolution in  frequency or amplitude. Clinical Correlation of the above findings indicates focal cerebral dysfunction over the right temproal region suggestive of underlying structural or physiologic abnormality.  There is tendency for seizures to arise from the right anterior temporal region. No electrographic seizures seen. Clinical correlation is advised. Ellouise Newer, M.D.   Overnight EEG with video  Result Date: 06/04/2021 Samuella Cota, MD     06/04/2021  8:23 AM  EEG Procedure CPT/Type of Study: [id number]; 2-12hr EEG with video Referring Provider: Emile Ringgenberg Primary Neurological Diagnosis: status epilepticus History: This is a 62 yr old patient, undergoing an EEG to evaluate for new onset seizures, status epilepticus. Clinical State: disoriented Technical Description: The EEG was performed using standard setting per the guidelines of American Clinical Neurophysiology Society (ACNS). A minimum of 21 electrodes were placed on scalp according to the International 10-20 or/and 10-10 Systems. Supplemental electrodes were placed as needed. Single EKG electrode was also used to detect cardiac arrhythmia. Patient's behavior was continuously recorded on video simultaneously with EEG. A minimum of 16 channels were used for data display. Each epoch of study was reviewed manually daily and as needed using standard referential and bipolar montages. Computerized quantitative EEG analysis (such as compressed spectral array analysis, trending, automated spike & seizure detection) were used as indicated. Day 1: from 0215 06/04/21 to 0730 06/04/21 EEG Description: Overall Amplitude:Normal Predominant Frequency: The background activity showed posterior dominant alpha, with about 9 Hz, that was frequent. Superimposed Frequencies: occasional theta and some beta activity over the left The background was asymmetric, decreased background over the right Background Abnormalities: Focal slowing: right hemisphere focal delta-theta Rhythmic or periodic  pattern: Lateralized Periodic Discharges: right frontal spiky LPDs, 1-_0 , with some ictal spread as below Epileptiform activity: Yes; spiky morphology to LPDs right frontal Electrographic seizures: Yes; several discrete electrographic seizures arising from right frontal LPDs with ictal evolution in frequency with buildup and spread through the hemisphere Events: no Breach rhythm: no Reactivity: Present Stimulation procedures: Hyperventilation: not done Photic stimulation: not done Sleep Background: Stage II EKG:no significant arrhythmia Impression: This was an abnormal continuous video EEG due to right frontal LPDs with some electrographic seizures. This was communicated to the consulting provider.       Hosie Poisson M.D. [facility] 06/04/2021, 2:27 PM  Available via Epic secure chat 7am-7pm After 7 pm, please refer to night coverage provider listed on amion.

## 2021-06-04 NOTE — Progress Notes (Signed)
EEG complete - results pending.

## 2021-06-04 NOTE — Progress Notes (Signed)
SLP Cancellation Note  Patient Details Name: [name] MRN: [id number] DOB: 1960-12-31   Cancelled treatment:       Reason Eval/Treat Not Completed: Patient at procedure or test/unavailable;Other (comment) (SLP to f/u next date in AM)   Sonia Baller, MA, CCC-SLP Speech Therapy

## 2021-06-04 NOTE — Progress Notes (Signed)
Pt's wife stated at approximately 8pm that pt had gone to sleep after having been agitated for much of the day. Stated that she did not want him disturbed for any reason, to include vital signs, CBG checks and medication administration.

## 2021-06-04 NOTE — Progress Notes (Signed)
.  LTM EEG hooked up and running - no initial skin breakdown - push button tested - neuro notified. [facility] monitoring.

## 2021-06-04 NOTE — Progress Notes (Signed)
LTM maint complete - no skin breakdown All Leads connected, MR Head Leads re-connected to Headbox leads. ECG Leads reapplied [facility] Monitored

## 2021-06-04 NOTE — Progress Notes (Signed)
Neurology Progress Note   Subjective: EEG overnight concerning for right frontal LPDs and with some electrographic seizures.  Patient's wife noted some restlessness overnight and states that when he has seizures, his mouth becomes watery with chewing-type movements of his mouth.   Exam: Vitals:   06/04/21 0800 06/04/21 1151  BP: (!) 147/83 129/78  Pulse:  (!) 125  Resp: 18 20  Temp: 98.9 F (37.2 C) 100.2 F (37.9 C)  SpO2:     Gen: Laying comfortably in bed, in no acute distress Resp: non-labored breathing, no respiratory distress Abd: soft, non-tender, non-distended.   Neuro: Mental Status: Awake, alert to self and location. He is unable to answer the current month or year. He is able to state his wife's name at bedside but is unable to state his daughter's name correctly.  He is more alert today than yesterday on examination.  He continues to answer in 1-2 word responses. Follows simple commands and sometimes needs coaching for command following.  Cranial Nerves: PERRL, EOMI, face is symmetric resting and with movement, hearing is intact to voice.  Motor: Patient is able to elevate bilateral upper extremities antigravity without vertical drift with constant coaching. He does have some ongoing weakness of the left hand with 4-/5 grip strength (improved from yesterday) and 4/5 grip strength on the right. He continues to have some minimal left > right lower extremity weakness on examination.  Bulk is decreased throughout.   Sensory: Intact and symmetric to light touch throughout Gait: Deferred  Pertinent Labs: CBC    Component Value Date/Time   WBC 7.3 06/03/2021 1627   RBC 2.73 (L) 06/03/2021 1627   HGB 8.8 (L) 06/03/2021 1637   HGB 10.5 (L) 06/11/2018 0852   HCT 26.0 (L) 06/03/2021 1637   HCT 33.1 (L) 06/11/2018 0852   PLT 212 06/03/2021 1627   PLT 222 06/11/2018 0852   MCV 102.6 (H) 06/03/2021 1627   MCV 93 06/11/2018 0852   MCH 29.7 06/03/2021 1627   MCHC 28.9 (L)  06/03/2021 1627   RDW 18.6 (H) 06/03/2021 1627   RDW 14.4 06/11/2018 0852   LYMPHSABS 1.0 06/03/2021 1627   MONOABS 0.4 06/03/2021 1627   EOSABS 0.0 06/03/2021 1627   BASOSABS 0.0 06/03/2021 1627   CMP     Component Value Date/Time   NA 140 06/04/2021 0903   NA 145 (H) 06/11/2018 0852   K 3.5 06/04/2021 0903   CL 103 06/04/2021 0903   CO2 22 06/04/2021 0903   GLUCOSE 57 (L) 06/04/2021 0903   BUN 24 (H) 06/04/2021 0903   BUN 34 (H) 06/11/2018 0852   CREATININE 2.71 (H) 06/04/2021 0903   CREATININE 3.21 (H) 02/02/2021 1109   CALCIUM 7.6 (L) 06/04/2021 0903   CALCIUM 7.7 (L) 05/10/2021 1022   PROT 5.8 (L) 06/04/2021 0608   ALBUMIN 2.2 (L) 06/04/2021 0608   AST 32 06/04/2021 0608   ALT 21 06/04/2021 0608   ALKPHOS 77 06/04/2021 0608   BILITOT 0.5 06/04/2021 0608   GFRNONAA 26 (L) 06/04/2021 0903   GFRNONAA 36 (L) 03/23/2017 1527   GFRAA 31 (L) 01/02/2020 1004   GFRAA 41 (L) 03/23/2017 1527    Latest Reference Range & Units 06/04/21 09:03  Phosphorus 2.5 - 4.6 mg/dL 4.2  Magnesium 1.7 - 2.4 mg/dL 1.6 (L)   Imaging Reviewed:  MRI brain wo contrast: 1. Technically limited exam due to the patient's inability to tolerate the full length of the study and extensive motion artifact. DWI imaging only  was performed. 2. Increased diffusion signal involving the mesial right temporal lobe/right hippocampal formation, presumably related to acute seizure given provided history. Possible patchy involvement of the right thalamus as well. Correlation with EEG recommended. 3. No other definite acute intracranial abnormality on this technically limited exam.  Routine EEG 2/11: "This is an abnormal EEG. The presence of mild diffuse slowing is consistent with diffuse cortical dysfunction and mild non-specific encephalopathy. The additional focal right hemisphere slowing is consistent with additional focal neuronal injury/dysfunction in that region. Right hemisphere, frontal predominant, and LPDs  are consistent with focal epileptic potential in that region.  Indeed, there was one event of focal right hemisphere evolution, consistent with seizure, lasting approximately 1 minute, and with clinical shivering/twitchiness seen during the period of time covered by this report."  Overnight EEG:  "This was an abnormal continuous video EEG due to right frontal LPDs with some electrographic seizures. This was communicated to the consulting provider."  Assessment: 61 y.o. male who presented to the ED for evaluation of multiple seizures today at home with concern for LPDs on outpatient EEG today. Recently discharged from the ED on 2/4 after his first seizure with outpatient neurology follow up. Initial ED work up revealed a magnesium of 1.0 and ionized calcium of 0.86. Additionally, patient's daughter reported at bedside that the patient has been unable to start his scheduled Briviact at home due to complications obtaining the medication and has only been taking calcium replacement once daily at home.  Given his focality on the EEG earlier, I do suspect that he has an underlying seizure predisposition that is being unmasked by his electrolyte abnormalities as opposed to being solely due to them. EEG overnight with right frontal LPDs with some electrographic seizures.   Recommendations: - Due to ongoing seizure activity on EEG, patient was given 2 mg of Ativan IV with improvement in EEG findings per attending neurologist's review - Continue overnight EEG  - Repeat MRI brain today s/p Ativan administration for seizure control on EEG - Needs ongoing calcium / magnesium replacement per primary team - Continue seizure precautions - Continue Briviact 64m BID - Patient was loaded with fosphenytoin 20 mgPE/kg with maintenance dosing of phenytoin 100 mg q8h IV - Trend metabolic labs  - 2 mg IV Ativan PRN for seizure activity lasting > 5 minutes and notify neurology - Neurology will continue to follow  SAnibal Henderson AGACNP-BC [facility] [phone]  I have seen the patietn and reviewed the above note. I suspect a focal predisposition with breakthorugh in the setting of severe hypomagnesemia and hypocalcemia. Plan as above but I will also add clonopin 0.590mBID.   McRoland RackMD [facility] [phone] 7pm- 7am, please page neurology on call as listed in [place]

## 2021-06-04 NOTE — Procedures (Signed)
[facility]  Routine EEG Report  Date and Time of Study: 06/04/21 at 0145 Duration: 24 minutes  Indication: Altered mentation.  Technical Summary: A routine 20-channel electroencephalogram using the international 10-20 system of electrode placement was performed.  Background: The best posterior dominant rhythm (PDR) identified was 8 Hz in the left hemisphere. The background consisted predominantly of mild theta slowing (7-8 Hz) in the left hemisphere, and focal theta and delta (2-6 Hz) slowing in the right hemisphere. The background was reactive to stimulation. There were abundant right hemisphere sharp waves, frontal predominant, with intermittent periodicity (i.e. lateralized periodic discharges, LPDs). There was one event of focal right hemisphere evolution, consistent with seizure, lasting approximately 1 minute, and with clinical shivering/twitchiness.   States: Awake and drowsy. Definitive sleep structures were not seen.  Activation Procedures:  Hyperventilation: Not performed Photic Stimulation: Not performed  Classification: abnormal due to:  1. Diffuse theta slowing.  2. Additional focal right hemisphere delta and theta slowing.  3. Abundant right hemisphere sharp waves, frontal predominant, with intermittent periodicity (i.e. right lateralized periodic discharges, LPDs) up to 1 Hz.  4. One focal right hemisphere electroclinical seizure.  Diagnosis: and Clinical Correlation: This is an abnormal EEG. The presence of mild diffuse slowing is consistent with diffuse cortical dysfunction and mild non-specific encephalopathy. The additional focal right hemisphere slowing is consistent with additional focal neuronal injury/dysfunction in that region. Right hemisphere, frontal predominant, and LPDs are consistent with focal epileptic potential in that region.  Indeed, there was one event of focal right hemisphere evolution, consistent with seizure,  lasting approximately 1 minute, and with clinical shivering/twitchiness seen during the period of time covered by this report.  Allena Earing, MD CNP/Epilepsy [facility] [phone] Case: [id number]

## 2021-06-04 NOTE — Procedures (Signed)
EEG Procedure CPT/Type of Study: [id number]; 2-12hr EEG with video Referring Provider: Vijaya Primary Neurological Diagnosis: status epilepticus  History: This is a 61 yr old patient, undergoing an EEG to evaluate for new onset seizures, status epilepticus. Clinical State: disoriented  Technical Description:  The EEG was performed using standard setting per the guidelines of American Clinical Neurophysiology Society (ACNS).  A minimum of 21 electrodes were placed on scalp according to the International 10-20 or/and 10-10 Systems. Supplemental electrodes were placed as needed. Single EKG electrode was also used to detect cardiac arrhythmia. Patient's behavior was continuously recorded on video simultaneously with EEG. A minimum of 16 channels were used for data display. Each epoch of study was reviewed manually daily and as needed using standard referential and bipolar montages. Computerized quantitative EEG analysis (such as compressed spectral array analysis, trending, automated spike & seizure detection) were used as indicated.   Day 1: from 0215 06/04/21 to 0730 06/04/21  EEG Description: Overall Amplitude:Normal Predominant Frequency: The background activity showed posterior dominant alpha, with about 9 Hz, that was frequent. Superimposed Frequencies: occasional theta and some beta activity over the left The background was asymmetric, decreased background over the right  Background Abnormalities: Focal slowing: right hemisphere focal delta-theta Rhythmic or periodic pattern: Lateralized Periodic Discharges: right frontal spiky LPDs, 1-_0 , with some ictal spread as below Epileptiform activity: Yes; spiky morphology to LPDs right frontal Electrographic seizures: Yes; several discrete electrographic seizures arising from right frontal LPDs with ictal evolution in frequency with buildup and spread through the hemisphere Events: no   Breach rhythm: no  Reactivity: Present  Stimulation  procedures:  Hyperventilation: not done Photic stimulation: not done  Sleep Background: Stage II  EKG:no significant arrhythmia  Impression: This was an abnormal continuous video EEG due to right frontal LPDs with some electrographic seizures. This was communicated to the consulting provider.

## 2021-06-05 ENCOUNTER — Inpatient Hospital Stay (HOSPITAL_COMMUNITY): Payer: Medicare Other

## 2021-06-05 DIAGNOSIS — Z7189 Other specified counseling: Secondary | ICD-10-CM | POA: Diagnosis not present

## 2021-06-05 DIAGNOSIS — E782 Mixed hyperlipidemia: Secondary | ICD-10-CM | POA: Diagnosis not present

## 2021-06-05 DIAGNOSIS — I5033 Acute on chronic diastolic (congestive) heart failure: Secondary | ICD-10-CM | POA: Diagnosis not present

## 2021-06-05 DIAGNOSIS — Z515 Encounter for palliative care: Secondary | ICD-10-CM

## 2021-06-05 DIAGNOSIS — N184 Chronic kidney disease, stage 4 (severe): Secondary | ICD-10-CM | POA: Diagnosis not present

## 2021-06-05 DIAGNOSIS — R569 Unspecified convulsions: Secondary | ICD-10-CM | POA: Diagnosis not present

## 2021-06-05 LAB — CSF CELL COUNT WITH DIFFERENTIAL
Eosinophils, CSF: 0 % (ref 0–1)
Eosinophils, CSF: 0 % (ref 0–1)
Lymphs, CSF: 0 % — ABNORMAL LOW (ref 40–80)
Lymphs, CSF: 0 % — ABNORMAL LOW (ref 40–80)
Monocyte-Macrophage-Spinal Fluid: 7 % — ABNORMAL LOW (ref 15–45)
Monocyte-Macrophage-Spinal Fluid: 7 % — ABNORMAL LOW (ref 15–45)
RBC Count, CSF: 1 /mm3 — ABNORMAL HIGH
RBC Count, CSF: 1 /mm3 — ABNORMAL HIGH
Segmented Neutrophils-CSF: 93 % — ABNORMAL HIGH (ref 0–6)
Segmented Neutrophils-CSF: 93 % — ABNORMAL HIGH (ref 0–6)
Tube #: 1
Tube #: 4
WBC, CSF: 122 /mm3 (ref 0–5)
WBC, CSF: 161 /mm3 (ref 0–5)

## 2021-06-05 LAB — GLUCOSE, CAPILLARY
Glucose-Capillary: 74 mg/dL (ref 70–99)
Glucose-Capillary: 80 mg/dL (ref 70–99)
Glucose-Capillary: 87 mg/dL (ref 70–99)
Glucose-Capillary: 98 mg/dL (ref 70–99)

## 2021-06-05 LAB — BASIC METABOLIC PANEL
Anion gap: 13 (ref 5–15)
BUN: 25 mg/dL — ABNORMAL HIGH (ref 8–23)
CO2: 24 mmol/L (ref 22–32)
Calcium: 7.7 mg/dL — ABNORMAL LOW (ref 8.9–10.3)
Chloride: 106 mmol/L (ref 98–111)
Creatinine, Ser: 2.77 mg/dL — ABNORMAL HIGH (ref 0.61–1.24)
GFR, Estimated: 25 mL/min — ABNORMAL LOW (ref >60)
Glucose, Bld: 89 mg/dL (ref 70–99)
Potassium: 3.6 mmol/L (ref 3.5–5.1)
Sodium: 143 mmol/L (ref 135–145)

## 2021-06-05 LAB — CBC WITH DIFFERENTIAL/PLATELET
Abs Immature Granulocytes: 0.02 10*3/uL (ref 0.00–0.07)
Basophils Absolute: 0 10*3/uL (ref 0.0–0.1)
Basophils Relative: 0 %
Eosinophils Absolute: 0 10*3/uL (ref 0.0–0.5)
Eosinophils Relative: 0 %
HCT: 24.9 % — ABNORMAL LOW (ref 39.0–52.0)
Hemoglobin: 7.3 g/dL — ABNORMAL LOW (ref 13.0–17.0)
Immature Granulocytes: 0 %
Lymphocytes Relative: 17 %
Lymphs Abs: 0.9 10*3/uL (ref 0.7–4.0)
MCH: 29.2 pg (ref 26.0–34.0)
MCHC: 29.3 g/dL — ABNORMAL LOW (ref 30.0–36.0)
MCV: 99.6 fL (ref 80.0–100.0)
Monocytes Absolute: 0.5 10*3/uL (ref 0.1–1.0)
Monocytes Relative: 10 %
Neutro Abs: 3.7 10*3/uL (ref 1.7–7.7)
Neutrophils Relative %: 73 %
Platelets: 180 10*3/uL (ref 150–400)
RBC: 2.5 MIL/uL — ABNORMAL LOW (ref 4.22–5.81)
RDW: 18.2 % — ABNORMAL HIGH (ref 11.5–15.5)
WBC: 5 10*3/uL (ref 4.0–10.5)
nRBC: 0 % (ref 0.0–0.2)

## 2021-06-05 LAB — MAGNESIUM: Magnesium: 1.8 mg/dL (ref 1.7–2.4)

## 2021-06-05 LAB — PHOSPHORUS: Phosphorus: 4.2 mg/dL (ref 2.5–4.6)

## 2021-06-05 LAB — PROTEIN AND GLUCOSE, CSF
Glucose, CSF: 49 mg/dL (ref 40–70)
Total  Protein, CSF: 64 mg/dL — ABNORMAL HIGH (ref 15–45)

## 2021-06-05 MED ORDER — SODIUM CHLORIDE 0.9 % IV SOLN
2.0000 g | Freq: Three times a day (TID) | INTRAVENOUS | Status: DC
  Administered 2021-06-05 – 2021-06-07 (×5): 2 g via INTRAVENOUS
  Filled 2021-06-05 (×6): qty 2000

## 2021-06-05 MED ORDER — SODIUM CHLORIDE 0.9 % IV SOLN
2.0000 g | Freq: Two times a day (BID) | INTRAVENOUS | Status: DC
  Administered 2021-06-05 – 2021-06-07 (×4): 2 g via INTRAVENOUS
  Filled 2021-06-05 (×4): qty 20

## 2021-06-05 MED ORDER — NICOTINE 21 MG/24HR TD PT24
21.0000 mg | MEDICATED_PATCH | Freq: Every day | TRANSDERMAL | Status: DC
  Administered 2021-06-05 – 2021-06-12 (×8): 21 mg via TRANSDERMAL
  Filled 2021-06-05 (×7): qty 1

## 2021-06-05 MED ORDER — DEXTROSE 5 % IV SOLN
500.0000 mg | INTRAVENOUS | Status: DC
  Administered 2021-06-05 – 2021-06-06 (×2): 500 mg via INTRAVENOUS
  Filled 2021-06-05 (×3): qty 10

## 2021-06-05 MED ORDER — VANCOMYCIN HCL 1250 MG/250ML IV SOLN
1250.0000 mg | Freq: Once | INTRAVENOUS | Status: AC
  Administered 2021-06-05: 1250 mg via INTRAVENOUS
  Filled 2021-06-05: qty 250

## 2021-06-05 MED ORDER — SODIUM CHLORIDE 0.9 % IV SOLN
2.0000 g | Freq: Four times a day (QID) | INTRAVENOUS | Status: DC
  Filled 2021-06-05 (×3): qty 2000

## 2021-06-05 MED ORDER — HEPARIN SODIUM (PORCINE) 5000 UNIT/ML IJ SOLN
5000.0000 [IU] | Freq: Three times a day (TID) | INTRAMUSCULAR | Status: DC
  Administered 2021-06-05 – 2021-06-15 (×28): 5000 [IU] via SUBCUTANEOUS
  Filled 2021-06-05 (×28): qty 1

## 2021-06-05 MED ORDER — LORAZEPAM 2 MG/ML IJ SOLN
2.0000 mg | Freq: Three times a day (TID) | INTRAMUSCULAR | Status: DC
  Administered 2021-06-05 – 2021-06-08 (×9): 2 mg via INTRAVENOUS
  Filled 2021-06-05 (×9): qty 1

## 2021-06-05 MED ORDER — LORAZEPAM 2 MG/ML IJ SOLN
2.0000 mg | Freq: Once | INTRAMUSCULAR | Status: AC
  Administered 2021-06-05: 2 mg via INTRAVENOUS
  Filled 2021-06-05: qty 1

## 2021-06-05 MED ORDER — SODIUM CHLORIDE 0.9 % IV SOLN
100.0000 mg | Freq: Two times a day (BID) | INTRAVENOUS | Status: DC
  Administered 2021-06-05 – 2021-06-09 (×9): 100 mg via INTRAVENOUS
  Filled 2021-06-05 (×10): qty 10

## 2021-06-05 MED ORDER — VANCOMYCIN HCL 500 MG/100ML IV SOLN
500.0000 mg | INTRAVENOUS | Status: DC
  Administered 2021-06-06: 500 mg via INTRAVENOUS
  Filled 2021-06-05: qty 100

## 2021-06-05 MED ORDER — DIAZEPAM 5 MG/ML IJ SOLN
5.0000 mg | Freq: Four times a day (QID) | INTRAMUSCULAR | Status: DC | PRN
  Administered 2021-06-05: 5 mg via INTRAVENOUS
  Filled 2021-06-05 (×2): qty 2

## 2021-06-05 NOTE — Evaluation (Signed)
Physical Therapy Evaluation Patient Details Name: [name] MRN: [id number] DOB: [birth year] Today's Date: 06/05/2021  History of Present Illness  Pt is a 61 y/o male who presents with recurrent seizures. PMH significant for CAD, CHF, HTN, CKD IV, Raynard's, GIB, recent history of seizures, hypothyroidism.   Clinical Impression  Pt admitted with above diagnosis. Pt currently with functional limitations due to the deficits listed below (see PT Problem List). At the time of PT eval pt was able to perform transfers with up to +2 mod assist for balance support and safety. Somewhat limited by EEG lines, but pt was able to attempt per-gait activity EOB. Pt with difficulty coordinating advancement of LE's to step forward and sideways. Of note, pt has been agitated and was medicated prior to PT eval. Based on performance today, feel this patient could benefit from the increased intensity of multidisciplinary rehab available at the AIR level. Acutely, pt will benefit from skilled PT to increase their independence and safety with mobility to allow discharge to the venue listed below.          Recommendations for follow up therapy are one component of a multi-disciplinary discharge planning process, led by the attending physician.  Recommendations may be updated based on patient status, additional functional criteria and insurance authorization.  Follow Up Recommendations Acute inpatient rehab (3hours/day)    Assistance Recommended at Discharge Frequent or constant Supervision/Assistance  Patient can return home with the following  Two people to help with walking and/or transfers;Assist for transportation;Help with stairs or ramp for entrance    Equipment Recommendations None recommended by PT  Recommendations for Other Services  Rehab consult    Functional Status Assessment Patient has had a recent decline in their functional status and demonstrates the ability to make significant improvements  in function in a reasonable and predictable amount of time.     Precautions / Restrictions Precautions Precautions: Fall Precaution Comments: Currently on continuous EEG; has been agitated. Restrictions Weight Bearing Restrictions: No      Mobility  Bed Mobility Overal bed mobility: Needs Assistance Bed Mobility: Supine to Sit, Sit to Supine     Supine to sit: Min assist, +2 for physical assistance, HOB elevated Sit to supine: Mod assist, +2 for physical assistance   General bed mobility comments: Multimodal cues throughout with intermittent intiation of movement to command.    Transfers Overall transfer level: Needs assistance Equipment used: 2 person hand held assist Transfers: Sit to/from Stand Sit to Stand: Min assist, Mod assist, +2 physical assistance           General transfer comment: Initially requiring +2 min assist progressing to +2 mod assist by end of session.    Ambulation/Gait             Pre-gait activities: Stepping forward and backwards from EOB, and attempted side stepping at EOB. Pt Initiating movement with the L foot when cued for the R, and initiating movement with the R foot when cued for the L. +2 assist required for all aspects of pre-gait at this time.    Stairs            Wheelchair Mobility    Modified Rankin (Stroke Patients Only)       Balance Overall balance assessment: Needs assistance Sitting-balance support: Feet supported, Bilateral upper extremity supported Sitting balance-Leahy Scale: Poor Sitting balance - Comments: Anterior lean, poor recovery of truncal balance with position changes to sitting.   Standing balance support: During functional activity, Bilateral  upper extremity supported Standing balance-Leahy Scale: Zero Standing balance comment: +2 required                             Pertinent Vitals/Pain Pain Assessment Pain Assessment: Faces Faces Pain Scale: No hurt    Home Living  Family/patient expects to be discharged to:: Private residence Living Arrangements: Spouse/significant other Available Help at Discharge: Family Type of Home: House Home Access: Stairs to enter Entrance Stairs-Rails: Left Entrance Stairs-Number of Steps: 4   Home Layout: One level Home Equipment: None Additional Comments: Above information taken from prior admission    Prior Function Prior Level of Function : Independent/Modified Independent                     Hand Dominance   Dominant Hand: Right    Extremity/Trunk Assessment   Upper Extremity Assessment Upper Extremity Assessment: Difficult to assess due to impaired cognition    Lower Extremity Assessment Lower Extremity Assessment: Difficult to assess due to impaired cognition    Cervical / Trunk Assessment Cervical / Trunk Assessment: Normal  Communication   Communication: No difficulties  Cognition Arousal/Alertness: Awake/alert Behavior During Therapy: Impulsive, Flat affect Overall Cognitive Status: Impaired/Different from baseline Area of Impairment: Orientation, Attention, Following commands, Memory, Safety/judgement, Awareness, Problem solving                 Orientation Level: Disoriented to, Place, Person, Time, Situation Current Attention Level: Focused Memory: Decreased short-term memory Following Commands: Follows one step commands inconsistently, Follows one step commands with increased time Safety/Judgement: Decreased awareness of safety, Decreased awareness of deficits Awareness: Intellectual Problem Solving: Slow processing, Decreased initiation, Difficulty sequencing, Requires verbal cues, Requires tactile cues          General Comments      Exercises     Assessment/Plan    PT Assessment Patient needs continued PT services  PT Problem List Decreased strength;Decreased activity tolerance;Decreased balance;Decreased mobility;Decreased cognition;Decreased knowledge of use of  DME;Decreased safety awareness;Decreased knowledge of precautions       PT Treatment Interventions DME instruction;Gait training;Stair training;Functional mobility training;Therapeutic activities;Therapeutic exercise;Balance training;Cognitive remediation;Patient/family education    PT Goals (Current goals can be found in the Care Plan section)  Acute Rehab PT Goals Patient Stated Goal: None stated by patient PT Goal Formulation: With patient/family Time For Goal Achievement: 06/19/21 Potential to Achieve Goals: Good    Frequency Min 3X/week     Co-evaluation PT/OT/SLP Co-Evaluation/Treatment: Yes Reason for Co-Treatment: Necessary to address cognition/behavior during functional activity;For patient/therapist safety;To address functional/ADL transfers (Pt has been agitated)           AM-PAC PT "6 Clicks" Mobility  Outcome Measure Help needed turning from your back to your side while in a flat bed without using bedrails?: A Lot Help needed moving from lying on your back to sitting on the side of a flat bed without using bedrails?: Total Help needed moving to and from a bed to a chair (including a wheelchair)?: Total Help needed standing up from a chair using your arms (e.g., wheelchair or bedside chair)?: Total Help needed to walk in hospital room?: Total Help needed climbing 3-5 steps with a railing? : Total 6 Click Score: 7    End of Session Equipment Utilized During Treatment: Gait belt;Oxygen Activity Tolerance: Patient tolerated treatment well Patient left: in bed;with call bell/phone within reach Nurse Communication: Mobility status PT Visit Diagnosis: Other symptoms and signs involving  the nervous system (R29.898)    Time: 8209-9068 PT Time Calculation (min) (ACUTE ONLY): 23 min   Charges:   PT Evaluation $PT Eval Moderate Complexity: 1 Mod          Rolinda Roan, PT, DPT Acute Rehabilitation Services Pager: [phone] Office: [phone]   Thelma Comp 06/05/2021, 9:58 AM

## 2021-06-05 NOTE — Procedures (Signed)
EEG Procedure CPT/Type of Study: 11643; 24hr EEG with video Referring Provider: Vijaya Primary Neurological Diagnosis: status epilepticus  History: This is a 61 yr old patient, undergoing an EEG to evaluate for new onset seizures, status epilepticus. Clinical State: disoriented  Technical Description:  The EEG was performed using standard setting per the guidelines of American Clinical Neurophysiology Society (ACNS).  A minimum of 21 electrodes were placed on scalp according to the International 10-20 or/and 10-10 Systems. Supplemental electrodes were placed as needed. Single EKG electrode was also used to detect cardiac arrhythmia. Patient's behavior was continuously recorded on video simultaneously with EEG. A minimum of 16 channels were used for data display. Each epoch of study was reviewed manually daily and as needed using standard referential and bipolar montages. Computerized quantitative EEG analysis (such as compressed spectral array analysis, trending, automated spike & seizure detection) were used as indicated.   Day 2: from 0730 06/04/21 to 0730 06/05/21  EEG Description: Overall Amplitude:Normal Predominant Frequency: The background activity showed posterior dominant alpha, with about 9 Hz, that was frequent. Superimposed Frequencies: occasional theta and some beta activity over the left The background was asymmetric, decreased background over the right  Background Abnormalities: Focal slowing: right hemisphere focal delta-theta Rhythmic or periodic pattern: Lateralized Periodic Discharges: right frontal spiky LPDs, _0 , with overriding fast activity but no further ictal evolution Epileptiform activity: Yes; spiky morphology to LPDs right frontal Electrographic seizures: No Events: no   Breach rhythm: no  Reactivity: Present  Stimulation procedures:  Hyperventilation: not done Photic stimulation: not done  Sleep Background: Stage II  EKG:no significant  arrhythmia  Impression: This was an abnormal continuous video EEG due to right frontal LPD-plus activity without any further ictal evolution.

## 2021-06-05 NOTE — Progress Notes (Signed)
Pharmacy Antibiotic Note  [name] a 61 y.o. male admitted on 06/03/2021 with new seizures and possible  meningitis.  Pharmacy has been consulted for acyclovir, vancomycin, ampicillin, ceftriaxone dosing.  WBC wnl, Tm 100.2 Cr 2.7 CrCl 26m/min  Plan: Ceftriaxone 2gm IV q12hr Ampicillin 2gm IV q8h Acyclovir 5029m(107mg) q24hr  Vancomycin 1250m6m then 500mg53mq24hr  Goal trough 15-20  Height: _0  (175.3 cm) Weight: 52.2 kg (115 lb 1.3 oz) IBW/kg (Calculated) : 70.7  Temp (24hrs), Avg:98.2 F (36.8 C), Min:97.6 F (36.4 C), Max:99 F (37.2 C)  Recent Labs  Lab 06/03/21 1627 06/03/21 1637 06/04/21 0903 06/05/21 0209  WBC 7.3  --   --  5.0  CREATININE 2.85* 2.60* 2.71* 2.77*    Estimated Creatinine Clearance: 20.4 mL/min (A) (by C-G formula based on SCr of 2.77 mg/dL (H)).    Allergies  Allergen Reactions   Oxycodone Other (See Comments)    Hallucinations    Antimicrobials this admission:   Dose adjustments this admission:  Microbiology results:   Cody Bua Bonnita [name] CPP, BCPS Clinical Pharmacist [phone]/2023 6:01 PM

## 2021-06-05 NOTE — Evaluation (Signed)
Occupational Therapy Evaluation Patient Details Name: [name] MRN: [id number] DOB: 11/26/1960 Today's Date: 06/05/2021   History of Present Illness Pt is a 61 y/o male who presents with recurrent seizures. PMH significant for CAD, CHF, HTN, CKD IV, Raynard's, GIB, recent history of seizures, hypothyroidism.   Clinical Impression   [name] was evaluated s/p the above recurrent seizures. Per report of his wife, he is generally indep at baseline and they live in a 1 level home with 4 STE. Upon evaluation pt was lethargic and required increased multimodal cues to follow commands. Overall he required min-mod A for bed mobility with fair-poor sitting balance, with some LOB likely due to fatigue. Once standing with min-mod A +2 he was able to take forward steps, but unable to sequence/process side stepping. Pt is limited by lethargy, impaired cognition, impaired coordination, generalized weakness and poor activity tolerance. Pt will benefit from OT acutely to address the deficits listed below. Recommend d/c ti AIR to progress pt back to his indep baseline prior to d/c home with the support of his wife.      Recommendations for follow up therapy are one component of a multi-disciplinary discharge planning process, led by the attending physician.  Recommendations may be updated based on patient status, additional functional criteria and insurance authorization.   Follow Up Recommendations  Acute inpatient rehab (3hours/day)    Assistance Recommended at Discharge Frequent or constant Supervision/Assistance  Patient can return home with the following A lot of help with walking and/or transfers;A lot of help with bathing/dressing/bathroom;Assistance with cooking/housework;Assistance with feeding;Direct supervision/assist for medications management;Assist for transportation;Help with stairs or ramp for entrance    Functional Status Assessment  Patient has had a recent decline in their functional  status and demonstrates the ability to make significant improvements in function in a reasonable and predictable amount of time.  Equipment Recommendations  Other (comment) (needs further evaluation)    Recommendations for Other Services Rehab consult     Precautions / Restrictions Precautions Precautions: Fall Precaution Comments: Currently on continuous EEG; has been agitated. Restrictions Weight Bearing Restrictions: No      Mobility Bed Mobility Overal bed mobility: Needs Assistance Bed Mobility: Supine to Sit, Sit to Supine     Supine to sit: Min assist, +2 for physical assistance, HOB elevated Sit to supine: Mod assist, +2 for physical assistance   General bed mobility comments: Multimodal cues throughout with intermittent intiation of movement to command.    Transfers Overall transfer level: Needs assistance Equipment used: 2 person hand held assist Transfers: Sit to/from Stand Sit to Stand: Min assist, Mod assist, +2 physical assistance           General transfer comment: Initially requiring +2 min assist progressing to +2 mod assist by end of session.      Balance Overall balance assessment: Needs assistance Sitting-balance support: Feet supported, Bilateral upper extremity supported Sitting balance-Leahy Scale: Poor Sitting balance - Comments: Anterior lean, poor recovery of truncal balance with position changes to sitting.   Standing balance support: During functional activity, Bilateral upper extremity supported Standing balance-Leahy Scale: Zero Standing balance comment: +2 required                           ADL either performed or assessed with clinical judgement   ADL Overall ADL's : Needs assistance/impaired Eating/Feeding: NPO   Grooming: Wash/dry face;Wash/dry hands;Oral care;Maximal assistance;Sitting   Upper Body Bathing: Maximal assistance;Sitting   Lower Body  Bathing: Maximal assistance;+2 for physical assistance;+2 for  safety/equipment;Sit to/from stand   Upper Body Dressing : Moderate assistance;Sitting   Lower Body Dressing: Maximal assistance;+2 for physical assistance;+2 for safety/equipment;Sit to/from stand   Toilet Transfer: +2 for physical assistance;+2 for safety/equipment;Stand-pivot;Moderate assistance   Toileting- Clothing Manipulation and Hygiene: Maximal assistance;+2 for physical assistance;+2 for safety/equipment;Sit to/from stand       Functional mobility during ADLs: Moderate assistance;+2 for physical assistance;+2 for safety/equipment General ADL Comments: assistance for level of arousal, cues, physical movement and balance. pt limited by letherfy 2/2 medication     Vision Baseline Vision/History: 1 Wears glasses Ability to See in Adequate Light: 0 Adequate Patient Visual Report: No change from baseline Vision Assessment?: Vision impaired- to be further tested in functional context Additional Comments: difficult to assess due to impaired cognition. keeping eyes closed 50% of the session.     Perception     Praxis      Pertinent Vitals/Pain Pain Assessment Pain Assessment: Faces Faces Pain Scale: No hurt     Hand Dominance Right   Extremity/Trunk Assessment Upper Extremity Assessment Upper Extremity Assessment: Difficult to assess due to impaired cognition (able to move above head, using to steadying in standing.)   Lower Extremity Assessment Lower Extremity Assessment: Difficult to assess due to impaired cognition   Cervical / Trunk Assessment Cervical / Trunk Assessment: Normal   Communication Communication Communication: No difficulties   Cognition Arousal/Alertness: Awake/alert Behavior During Therapy: Impulsive, Flat affect Overall Cognitive Status: Impaired/Different from baseline Area of Impairment: Orientation, Attention, Following commands, Memory, Safety/judgement, Awareness, Problem solving                 Orientation Level: Disoriented to,  Place, Person, Time, Situation Current Attention Level: Focused Memory: Decreased short-term memory Following Commands: Follows one step commands inconsistently, Follows one step commands with increased time Safety/Judgement: Decreased awareness of safety, Decreased awareness of deficits Awareness: Intellectual Problem Solving: Slow processing, Decreased initiation, Difficulty sequencing, Requires verbal cues, Requires tactile cues       General Comments  VSS on RA, EEG intact. family in the room    Exercises     Shoulder Instructions      Home Living Family/patient expects to be discharged to:: Private residence Living Arrangements: Spouse/significant other Available Help at Discharge: Family Type of Home: House Home Access: Stairs to enter Technical brewer of Steps: 4 Entrance Stairs-Rails: Left Home Layout: One level     Bathroom Shower/Tub: Teacher, early years/pre: Standard     Home Equipment: None   Additional Comments: Above information taken from prior admission      Prior Functioning/Environment Prior Level of Function : Independent/Modified Independent                        OT Problem List: Decreased activity tolerance;Decreased strength;Decreased range of motion;Impaired balance (sitting and/or standing);Decreased cognition;Decreased safety awareness;Decreased knowledge of precautions;Decreased knowledge of use of DME or AE;Impaired UE functional use      OT Treatment/Interventions: Therapeutic exercise;Self-care/ADL training;DME and/or AE instruction;Therapeutic activities;Patient/family education;Balance training    OT Goals(Current goals can be found in the care plan section) Acute Rehab OT Goals Patient Stated Goal: get OOB OT Goal Formulation: With patient/family Time For Goal Achievement: 06/19/21 Potential to Achieve Goals: Good ADL Goals Pt Will Perform Grooming: with modified independence;standing Pt Will Perform  Upper Body Dressing: with modified independence;sitting Pt Will Perform Lower Body Dressing: with modified independence;sit to/from stand Pt Will Transfer  to Toilet: with supervision;ambulating Pt/caregiver will Perform Home Exercise Program: Increased strength;Both right and left upper extremity;With written HEP provided Additional ADL Goal #1: Pt will indep follow multi-step ADL task  OT Frequency: Min 2X/week    Co-evaluation PT/OT/SLP Co-Evaluation/Treatment: Yes Reason for Co-Treatment: Necessary to address cognition/behavior during functional activity;For patient/therapist safety   OT goals addressed during session: ADL's and self-care      AM-PAC OT "6 Clicks" Daily Activity     Outcome Measure Help from another person eating meals?: Total Help from another person taking care of personal grooming?: A Lot Help from another person toileting, which includes using toliet, bedpan, or urinal?: A Lot Help from another person bathing (including washing, rinsing, drying)?: A Lot Help from another person to put on and taking off regular upper body clothing?: A Lot Help from another person to put on and taking off regular lower body clothing?: A Lot 6 Click Score: 11   End of Session Equipment Utilized During Treatment: Gait belt Nurse Communication: Mobility status  Activity Tolerance: Patient tolerated treatment well;Patient limited by lethargy Patient left: in bed;with call bell/phone within reach;with bed alarm set;with family/visitor present  OT Visit Diagnosis: Other abnormalities of gait and mobility (R26.89);Muscle weakness (generalized) (M62.81);Unsteadiness on feet (R26.81)                Time: 7618-4859 OT Time Calculation (min): 25 min Charges:  OT General Charges $OT Visit: 1 Visit OT Evaluation $OT Eval Moderate Complexity: 1 Mod   Nilam Quakenbush A Lacreshia Bondarenko 06/05/2021, 11:28 AM

## 2021-06-05 NOTE — Progress Notes (Signed)
SLP Cancellation Note  Patient Details Name: [name] MRN: [id number] DOB: December 01, 1960   Cancelled treatment:       Reason Eval/Treat Not Completed: Patient's level of consciousness;Other (comment) (Patient has been too lethargic from Haldol taken recently. SLP will attempt to return in PM if patient alert enough.)   Sonia Baller, MA, CCC-SLP Speech Therapy

## 2021-06-05 NOTE — Procedures (Signed)
LUMBAR PUNCTURE (SPINAL TAP) PROCEDURE NOTE  Indication: New onset refractory seizures, rule out encephalitis   Proceduralists: Dr. Leonel Ramsay, Anibal Henderson, NP   Risks of the procedure were dicussed with the patient including post-LP headache, bleeding, infection, weakness/numbness of legs(radiculopathy).    Consent obtained from: relative; patient's wife at bedside    Procedure Note The patient was prepped and draped, and using sterile technique a 20 gauge quinke spinal needle was inserted in the L4-5 space.   Opening pressure was 12 cm H2O.   Approximately 12 cc of CSF were obtained and sent for analysis.  Patient tolerated the procedure well and blood loss was minimal.    Anibal Henderson, AGAC-NP [facility] Pager: [phone]  I was present for the entire procedure.  Roland Rack, MD [facility] [phone]  If 7pm- 7am, please page neurology on call as listed in [place].

## 2021-06-05 NOTE — Progress Notes (Signed)
[facility]                                                                               [name], is a 61 y.o. male, DOB - [birth year], EUM:[id number] Admit date - 06/03/2021    Outpatient Primary MD for the patient is [name], Ravisankar, MD  LOS - 2  days    Brief summary   [name] is a 61 y.o. male with medical history significant of CAD, CHF, hypertension, CKD stage IV, Raynard's, GI bleed, recent history of seizures who presents with recurrent seizures.   Assessment & Plan    Assessment and Plan:  Recurrent seizures:  Continues to have tonic clonic seizures.  - seen by neurology and is on continuous EEG monitoring.  - probably triggered by electrolyte imbalance in the setting of baseline seizure activity.  - resume Briviact 100 mg BID. IV fosphenytoin and keppra given.  - MRI brain without contrast repeated and showed Limited and motion degraded examination. Redemonstration of increased T2 signal affecting the right mesial temporal lobe, hippocampus and right mesial thalamus, presumed to be postictal change.     Hypomagnesemia:  Replaced , repeat levels are pending.    Mild acute on Chronic diastolic heart failure:  Appears to have resolved. He appears euvolemic. He was on IV lasix 60 mg  (2 doses given). Since he is NPO and not eating much or drinking much. Will d/c IV lasix.  CXR on admission showed moderate right pleural effusion .  Unable to check urine output due to incontinence.  Repeat echo is pending.   Hypocalcemia:  Corrected calcium level is 9.1 with an albumin of 2.2.   Recent C diff colitis:  Appears to have resolved.  Pt denies any abd pain, diarrhea.    Hyperlipidemia:  On statin which is on hold.    Hypertension:  Well controlled.    Stage 4 CKD:  Creatinine so far appears to be at baseline.  Creatinine at 2.7.    Acute metabolic and toxic encephalopathy:  - due to recurrent seizures and delirious and  disoriented due to IV ativan and clonazepam.  - hallucinations overnight.    Tobacco abuse:  - started on nicotine patch today.  - pt smokes a pack of cigarettes a day.   Pulmonary Hypertension:  Resume Opsumit   Anemia of chronic disease:  Normocytic.  Baseline between 7 to 8. Suspect from stage 4 CKD. Anemia panel from last month reviewed.  Adequate iron and folate.  Check vitamin b12 levels in am.   Moderate protein and calorie malnutrition;  Body mass index is 16.99 kg/m. Nutrition consulted.   Inview of recurrent admissions , deconditioning and debility, palliative care consulted for goals of care discussions.    RN Pressure Injury Documentation: Pressure Injury 06/03/21 Sacrum Mid Stage 2 -  Partial thickness loss of dermis presenting as a shallow open injury with a red, pink wound bed without slough. (Active)  06/03/21 1654  Location: Sacrum  Location Orientation: Mid  Staging: Stage 2 -  Partial thickness loss of dermis presenting as a shallow open injury with a red, pink wound bed  without slough.  Wound Description (Comments):   Present on Admission: Yes  Will request wound care consult in am.     Estimated body mass index is 16.99 kg/m as calculated from the following:   Height as of this encounter: 5' 9" (1.753 m).   Weight as of this encounter: 52.2 kg.  Code Status: full code.  DVT Prophylaxis:  heparin injection 5,000 Units Start: 06/03/21 2200   Level of Care: Level of care: Progressive Family Communication: Updated patient's family at bedside.   Disposition Plan:     Remains inpatient appropriate:  recurrent seizures.    Procedures:  EEG.  MRI brain without contrast.   Consultants:   Neurology.  Palliative. Care   Antimicrobials:   Anti-infectives (From admission, onward)    None        Medications  Scheduled Meds:  brivaracetam  100 mg Oral BID   calcium carbonate  200 mg of elemental calcium Oral BID   chlorhexidine  gluconate (MEDLINE KIT)  15 mL Mouth Rinse BID   clonazePAM  0.5 mg Oral BID   heparin  5,000 Units Subcutaneous Q8H   macitentan  10 mg Oral Daily   mouth rinse  15 mL Mouth Rinse 10 times per day   nicotine  21 mg Transdermal Daily   phenytoin (DILANTIN) IV  100 mg Intravenous Q8H   rosuvastatin  20 mg Oral Daily   Continuous Infusions:   PRN Meds:.diazepam, LORazepam    Subjective:   [name] was seen and examined today.   Pt delirious.  Able to move all extremities.   Objective:   Vitals:   06/04/21 2058 06/05/21 0005 06/05/21 0421 06/05/21 0917  BP: 135/71 127/72 105/87 (!) 157/92  Pulse: 99 64 88 (!) 102  Resp: _0 Temp: 97.9 F (36.6 C) 97.7 F (36.5 C) 98.6 F (37 C) 97.6 F (36.4 C)  TempSrc: Axillary Axillary Axillary Oral  SpO2:   90%   Weight:      Height:        Intake/Output Summary (Last 24 hours) at 06/05/2021 1011 Last data filed at 06/04/2021 1845 Gross per 24 hour  Intake --  Output 500 ml  Net -500 ml    Filed Weights   06/03/21 1626  Weight: 52.2 kg     Exam General exam: Ill appearing, malnutrition.  Respiratory system: Clear to auscultation. Respiratory effort normal. Cardiovascular system: S1 & S2 heard, RRR. No JVD,  Gastrointestinal system: Abdomen is nondistended, soft and nontender. Normal bowel sounds heard. Central nervous system: somnolent from IV benzo Extremities: no pedal edema Skin: No rashes Psychiatry: cannot be assessed.     Data Reviewed:  I have personally reviewed following labs and imaging studies   CBC Lab Results  Component Value Date   WBC 5.0 06/05/2021   RBC 2.50 (L) 06/05/2021   HGB 7.3 (L) 06/05/2021   HCT 24.9 (L) 06/05/2021   MCV 99.6 06/05/2021   MCH 29.2 06/05/2021   PLT 180 06/05/2021   MCHC 29.3 (L) 06/05/2021   RDW 18.2 (H) 06/05/2021   LYMPHSABS 0.9 06/05/2021   MONOABS 0.5 06/05/2021   EOSABS 0.0 06/05/2021   BASOSABS 0.0 57/04/7791     Last metabolic panel Lab  Results  Component Value Date   NA 143 06/05/2021   K 3.6 06/05/2021   CL 106 06/05/2021   CO2 24 06/05/2021   BUN 25 (H) 06/05/2021   CREATININE 2.77 (H) 06/05/2021   GLUCOSE 89  L) 06/05/2021  ° GFRAA 31 (L) 01/02/2020  ° CALCIUM 7.7 (L) 06/05/2021  ° PHOS 4.2 06/04/2021  ° PROT 5.8 (L) 06/04/2021  ° ALBUMIN 2.2 (L) 06/04/2021  ° BILITOT 0.5 06/04/2021  ° ALKPHOS 77 06/04/2021  ° AST 32 06/04/2021  ° ALT 21 06/04/2021  ° ANIONGAP 13 06/05/2021  ° ° °CBG (last 3)  °Recent Labs  °  06/04/21 °2056 06/05/21 °0000 06/05/21 °0423  °GLUCAP 86 98 87  ° °  ° ° °Coagulation Profile: °No results for input(s): INR, PROTIME in the last 168 hours. ° ° °Radiology Studies: °CT Head Wo Contrast ° °Result Date: 06/03/2021 °CLINICAL DATA:  Mental status changes of unknown cause.  Seizure. EXAM: CT HEAD WITHOUT CONTRAST TECHNIQUE: Contiguous axial images were obtained from the base of the skull through the vertex without intravenous contrast. RADIATION DOSE REDUCTION: This exam was performed according to the departmental dose-optimization program which includes automated exposure control, adjustment of the mA and/or kV according to patient size and/or use of iterative reconstruction technique. COMPARISON:  05/28/2021 FINDINGS: Brain: The head is asymmetrically positioned within the gantry. Allowing for this, no change is seen compared to the study of 6 days ago. Mild generalized volume loss without evidence of focal infarction, mass lesion, hemorrhage, hydrocephalus or extra-axial collection. Vascular: There is atherosclerotic calcification of the major vessels at the base of the brain. Skull: Negative Sinuses/Orbits: Clear/normal Other: None IMPRESSION: No change.  Mild volume loss.  No focal or acute finding. Electronically Signed   By: Mark  Shogry M.D.   On: 06/03/2021 17:17  ° °MR BRAIN WO CONTRAST ° °Result Date: 06/04/2021 °CLINICAL DATA:  New onset seizure.  Altered mental status. EXAM: MRI  HEAD WITHOUT CONTRAST TECHNIQUE: Multiplanar, multiecho pulse sequences of the brain and surrounding structures were obtained without intravenous contrast. COMPARISON:  06/03/2021 MRI and CT. FINDINGS: Brain: Again the study is limited by patient confusion and motion. The study confirms brain swelling and increased T2 signal without definite true restricted diffusion within the mesial right temporal lobe, right hippocampus and right mesial thalamus. Findings are presumed to be postictal rather than subsequent to ischemic infarction. Otherwise, the brain appears normal. No evidence of old infarction, mass, hydrocephalus or extra-axial collection. There may be a developmental venous anomaly in the right basal ganglia and radiating white matter tracts. Vascular: Major vessels at the base of the brain show flow. Skull and upper cervical spine: Negative Sinuses/Orbits: Clear/negative Other: None IMPRESSION: Limited and motion degraded examination. Redemonstration of increased T2 signal affecting the right mesial temporal lobe, hippocampus and right mesial thalamus, presumed to be postictal change. I do not see low signal on the ADC map to suggest actual brain infarction. Question developmental venous anomaly in the right basal ganglia and radiating white matter tracts. Electronically Signed   By: Mark  Shogry M.D.   On: 06/04/2021 20:30  ° °MR BRAIN WO CONTRAST ° °Result Date: 06/03/2021 °CLINICAL DATA:  Initial evaluation for acute seizure. EXAM: MRI HEAD WITHOUT CONTRAST TECHNIQUE: Multiplanar, multiecho pulse sequences of the brain and surrounding structures were obtained without intravenous contrast. COMPARISON:  Prior CT from earlier the same day. FINDINGS: Brain: Examination technically limited as the patient was unable to tolerate the full length of the exam. Axial coronal DWI sequences only were performed. Additionally, the provided images are severely degraded by motion artifact. Diffusion-weighted imaging  demonstrates increased diffusion signal involving the mesial right temporal lobe/right hippocampal formation, presumably related to acute seizure given provided history. Possible patchy involvement   of the right thalamus noted as well (series 7, image 47). Otherwise no other definite acute intracranial abnormality seen on this technically limited exam. No convincing evidence for acute or subacute infarct elsewhere. Gray-white matter differentiation otherwise grossly maintained. No visible areas of encephalomalacia to suggest prior infarction or other insult. No visible mass lesion or mass effect. No midline shift or hydrocephalus. No extra-axial fluid collection. Vascular: Not assessed on this limited exam. Skull and upper cervical spine: Not assessed on this limited exam. Sinuses/Orbits: Not assessed on this limited exam. Other: None. IMPRESSION: 1. Technically limited exam due to the patient's inability to tolerate the full length of the study and extensive motion artifact. DWI imaging only was performed. 2. Increased diffusion signal involving the mesial right temporal lobe/right hippocampal formation, presumably related to acute seizure given provided history. Possible patchy involvement of the right thalamus as well. Correlation with EEG recommended. 3. No other definite acute intracranial abnormality on this technically limited exam. Electronically Signed   By: Benjamin  McClintock M.D.   On: 06/03/2021 23:23  ° °DG Chest Portable 1 View ° °Result Date: 06/03/2021 °CLINICAL DATA:  Possible pneumonia EXAM: PORTABLE CHEST 1 VIEW COMPARISON:  05/20/2021, CT 02/20/2021 FINDINGS: Moderate right pleural effusion and small left effusion, increased compared to prior. Worsened airspace disease at the bases. Cardiomegaly with vascular congestion and mild interstitial edema. IMPRESSION: 1. Increased bilateral pleural effusions right greater than left, moderate in size on the right. Worsened airspace disease at the bases  which may be due to atelectasis or pneumonia 2. Cardiomegaly with vascular congestion and mild interstitial edema Electronically Signed   By: Kim  Fujinaga M.D.   On: 06/03/2021 16:48  ° °EEG adult ° °Result Date: 06/04/2021 °Gangloff, Steven J, MD     06/04/2021  8:40 AM [facility] Routine EEG Report Date and Time of Study: 06/04/21 at 0145 Duration: 24 minutes Indication: Altered mentation. Technical Summary: A routine 20-channel electroencephalogram using the international 10-20 system of electrode placement was performed. Background: The best posterior dominant rhythm (PDR) identified was 8 Hz in the left hemisphere. The background consisted predominantly of mild theta slowing (7-8 Hz) in the left hemisphere, and focal theta and delta (2-6 Hz) slowing in the right hemisphere. The background was reactive to stimulation. There were abundant right hemisphere sharp waves, frontal predominant, with intermittent periodicity (i.e. lateralized periodic discharges, LPDs). There was one event of focal right hemisphere evolution, consistent with seizure, lasting approximately 1 minute, and with clinical shivering/twitchiness. States: Awake and drowsy. Definitive sleep structures were not seen. Activation Procedures: Hyperventilation: Not performed Photic Stimulation: Not performed Classification: abnormal due to: 1. Diffuse theta slowing. 2. Additional focal right hemisphere delta and theta slowing. 3. Abundant right hemisphere sharp waves, frontal predominant, with intermittent periodicity (i.e. right lateralized periodic discharges, LPDs) up to 1 Hz. 4. One focal right hemisphere electroclinical seizure. Diagnosis: and Clinical Correlation: This is an abnormal EEG. The presence of mild diffuse slowing is consistent with diffuse cortical dysfunction and mild non-specific encephalopathy. The additional focal right hemisphere slowing is consistent with additional focal neuronal  injury/dysfunction in that region. Right hemisphere, frontal predominant, and LPDs are consistent with focal epileptic potential in that region.  Indeed, there was one event of focal right hemisphere evolution, consistent with seizure, lasting approximately 1 minute, and with clinical shivering/twitchiness seen during the period of time covered by this report. Steven Gangloff, MD CNP/Epilepsy [facility] [phone] Case: [id number] ° °EEG adult ° °Result Date: 06/03/2021 °Aquino, Karen M,   MD     06/03/2021  3:16 PM ELECTROENCEPHALOGRAM REPORT Date of Study: 06/03/2021 Patient's Name: [name] MRN: [id number] Date of Birth: 11/24/1960 Referring Provider: Dr. Karen Aquino Clinical History: This is a 60 year old man with new onset seizure on 05/28/2021. EEG for classification. Medications: ZYLOPRIM 100 MG tablet XANAX 0.5 MG tablet TUMS - DOSED IN MG ELEMENTAL CALCIUM 500 MG chewable tablet RETACRIT IJ NORCO 10-325 MG tablet OPSUMIT 10 MG tablet ROBAXIN 500 MG tablet CENTRUM chewable tablet PROTONIX 40 MG tablet KLOR-CON M20 20 MEQ tablet CRESTOR 20 MG tablet REVATIO 20 MG tabl0et sodium bicarbonate 650 MG tablet DEMADEX 20 MG tablet Technical Summary: A multichannel digital 1-hour EEG recording measured by the international 10-20 system with electrodes applied with paste and impedances below 5000 ohms performed in our laboratory with EKG monitoring in an awake and drowsy patient.  Hyperventilation was not performed. Photic stimulation was performed.  The digital EEG was referentially recorded, reformatted, and digitally filtered in a variety of bipolar and referential montages for optimal display.  Description: The patient is awake and drowsy during the recording.  During maximal wakefulness, there is a symmetric, medium voltage 10 Hz posterior dominant rhythm better formed over the left occipital region. There is frequent focal 4-5 Hz theta slowing over the right temporal region. During drowsiness, there is  an increase in theta slowing of the background. Sleep was not captured. Photic stimulation did not elicit any abnormalities.  There were frequent broad sharp waves over the right anterior temporal region, at times occurring in a pseudo-periodic pattern with no evolution in frequency or amplitude. No electrographic seizures seen.  EKG lead was unremarkable. Impression: This 1-hour awake and drowsy EEG is abnormal due to the presence of: Focal slowing over the right temporal region Frequent epileptiform discharges over the right anterior temporal region, at times occurring in a periodic pattern with no evolution in frequency or amplitude. Clinical Correlation of the above findings indicates focal cerebral dysfunction over the right temproal region suggestive of underlying structural or physiologic abnormality.  There is tendency for seizures to arise from the right anterior temporal region. No electrographic seizures seen. Clinical correlation is advised. Karen Aquino, M.D.  ° °Overnight EEG with video ° °Result Date: 06/04/2021 °Sass, Erik J, MD     06/04/2021  8:23 AM EEG Procedure CPT/Type of Study: 95718; 2-12hr EEG with video Referring Provider:  Primary Neurological Diagnosis: status epilepticus History: This is a 60 yr old patient, undergoing an EEG to evaluate for new onset seizures, status epilepticus. Clinical State: disoriented Technical Description: The EEG was performed using standard setting per the guidelines of American Clinical Neurophysiology Society (ACNS). A minimum of 21 electrodes were placed on scalp according to the International 10-20 or/and 10-10 Systems. Supplemental electrodes were placed as needed. Single EKG electrode was also used to detect cardiac arrhythmia. Patient's behavior was continuously recorded on video simultaneously with EEG. A minimum of 16 channels were used for data display. Each epoch of study was reviewed manually daily and as needed using standard referential and  bipolar montages. Computerized quantitative EEG analysis (such as compressed spectral array analysis, trending, automated spike & seizure detection) were used as indicated. Day 1: from 0215 06/04/21 to 0730 06/04/21 EEG Description: Overall Amplitude:Normal Predominant Frequency: The background activity showed posterior dominant alpha, with about 9 Hz, that was frequent. Superimposed Frequencies: occasional theta and some beta activity over the left The background was asymmetric, decreased background over the right Background Abnormalities: Focal slowing:   right hemisphere focal delta-theta Rhythmic or periodic pattern: Lateralized Periodic Discharges: right frontal spiky LPDs, 1-2Hz, with some ictal spread as below Epileptiform activity: Yes; spiky morphology to LPDs right frontal Electrographic seizures: Yes; several discrete electrographic seizures arising from right frontal LPDs with ictal evolution in frequency with buildup and spread through the hemisphere Events: no Breach rhythm: no Reactivity: Present Stimulation procedures: Hyperventilation: not done Photic stimulation: not done Sleep Background: Stage II EKG:no significant arrhythmia Impression: This was an abnormal continuous video EEG due to right frontal LPDs with some electrographic seizures. This was communicated to the consulting provider.       Hosie Poisson M.D. [facility] 06/05/2021, 10:11 AM  Available via Epic secure chat 7am-7pm After 7 pm, please refer to night coverage provider listed on amion.

## 2021-06-05 NOTE — Progress Notes (Signed)
Neurology Progress Note  Subjective: Patient restless, attempting to get out of bed, pulling off EEG leads.  Patient's wife at bedside noted that the patient did not sleep at all last night.  He remains altered from baseline.   Exam: Vitals:   06/05/21 0421 06/05/21 0917  BP: 105/87 (!) 157/92  Pulse: 88 (!) 102  Resp: 16   Temp: 98.6 F (37 C) 97.6 F (36.4 C)  SpO2: 90%    Gen: Restless, laying in bed, attempting to ambulate, pulling off monitors and blankets. He is not redirectable  Resp: non-labored breathing, no respiratory distress on room air Abd: soft, non-distended  Neuro: Mental Status: Awake, restless. He occasionally yells out his wife's name and yells out about "not having any blood left". He yells out that he wants socks on and for his family to "start the truck" He does not answer orientation questions or follow commands Patient is not redirectable.  Cranial Nerves: PERRL, EOMI, hearing is intact to voice Motor: Moves all extremities restlessly without noted asymmetry  Sensory: Increases restless movements of each extremity with manipulation  Gait: Deferred for patient safety  Pertinent Labs: CBC    Component Value Date/Time   WBC 5.0 06/05/2021 0209   RBC 2.50 (L) 06/05/2021 0209   HGB 7.3 (L) 06/05/2021 0209   HGB 10.5 (L) 06/11/2018 0852   HCT 24.9 (L) 06/05/2021 0209   HCT 33.1 (L) 06/11/2018 0852   PLT 180 06/05/2021 0209   PLT 222 06/11/2018 0852   MCV 99.6 06/05/2021 0209   MCV 93 06/11/2018 0852   MCH 29.2 06/05/2021 0209   MCHC 29.3 (L) 06/05/2021 0209   RDW 18.2 (H) 06/05/2021 0209   RDW 14.4 06/11/2018 0852   LYMPHSABS 0.9 06/05/2021 0209   MONOABS 0.5 06/05/2021 0209   EOSABS 0.0 06/05/2021 0209   BASOSABS 0.0 06/05/2021 0209   CMP     Component Value Date/Time   NA 143 06/05/2021 0209   NA 145 (H) 06/11/2018 0852   K 3.6 06/05/2021 0209   CL 106 06/05/2021 0209   CO2 24 06/05/2021 0209   GLUCOSE 89 06/05/2021 0209   BUN 25 (H)  06/05/2021 0209   BUN 34 (H) 06/11/2018 0852   CREATININE 2.77 (H) 06/05/2021 0209   CREATININE 3.21 (H) 02/02/2021 1109   CALCIUM 7.7 (L) 06/05/2021 0209   CALCIUM 7.7 (L) 05/10/2021 1022   PROT 5.8 (L) 06/04/2021 0608   ALBUMIN 2.2 (L) 06/04/2021 0608   AST 32 06/04/2021 0608   ALT 21 06/04/2021 0608   ALKPHOS 77 06/04/2021 0608   BILITOT 0.5 06/04/2021 0608   GFRNONAA 25 (L) 06/05/2021 0209   GFRNONAA 36 (L) 03/23/2017 1527   GFRAA 31 (L) 01/02/2020 1004   GFRAA 41 (L) 03/23/2017 1527   Imaging Reviewed:  MRI brain 2/11: - Limited and motion degraded examination. Redemonstration of increased T2 signal affecting the right mesial temporal lobe, hippocampus and right mesial thalamus, presumed to be postictal change. I do not see low signal on the ADC map to suggest actual brain infarction. - Question developmental venous anomaly in the right basal ganglia and radiating white matter tracts.  Overnight EEG 2/11 - 2/12: "This was an abnormal continuous video EEG due to right frontal LPD-plus activity without any further ictal evolution."  Assessment: 61 y.o. male who presented to the ED for evaluation of multiple seizures today at home with concern for LPDs on outpatient EEG today. Recently discharged from the ED on 2/4 after his first  seizure with outpatient neurology follow up. Initial ED work up revealed a magnesium of 1.0 and ionized calcium of 0.86. Given his focality on the EEG, I do suspect that he has an underlying seizure predisposition that is being unmasked by his electrolyte abnormalities as opposed to being solely due to them as patient continues to have LPDs with electrolyte replacement. EEG overnight with continued right frontal LPDs. Patient with restlessness 2/12, concern for possible delirium versus status. In the setting of new onset intractable seizures, will need LP.   Recommendations: - Continue LTM EEG - Continue seizure precautions - Continue Briviact 19m BID and  phenytoin 100 mg q8h IV - Trend metabolic labs  - 2 mg IV Ativan PRN for seizure activity lasting > 5 minutes and notify neurology - Will need an LP with CSF studies  - Delirium precautions - Neurology will continue to follow  SAnibal Henderson AGACNP-BC [facility] [phone] I have seen the patient and reviewed the above note.  The pattern on EEG is concerning for ongoing ictal pattern.  He was given Ativan today with improvement in the EEG as well as his clinical status, suggesting that this is ictal.  LP revealed an inflammatory CSF, though the cell count is not typical of bacterial infection with a neutrophilic pleocytosis, I have added full CNS coverage.  Once CSF cultures are negative, could back down on the antibacterials.  It is possible that this simply represents a reactive pleocytosis due to status epilepticus in the setting of his electrolyte abnormalities and underlying predisposition, but I have added an autoimmune encephalitis panel, as well as a bio fire infectious encephalitis panel.  I will be more aggressive with his antiepileptics, increasing his Briviact to 100 twice daily as well as adding scheduled benzodiazepine Ativan 2 mg every 8 hours.  MRoland Rack MD [facility] [phone] If 7pm- 7am, please page neurology on call as listed in [place]

## 2021-06-05 NOTE — Progress Notes (Signed)
Critical lab results called  about lumbar puncture. Notified MD.

## 2021-06-05 NOTE — Progress Notes (Signed)
Reapplied EEG electrodes  Applied stocking cap.

## 2021-06-05 NOTE — Consult Note (Signed)
Consultation Note Date: 06/05/2021   Patient Name: [name]  DOB: Mar 15, 1961  MRN: [id number]  Age / Sex: 61 y.o., male  PCP: [name], MD Referring Physician: Hosie Poisson, MD  Reason for Consultation: Establishing goals of care  HPI/Patient Profile: 61 y.o. male  with past medical history of CAD, CHF, HTN, HLD, MI, CKD stage 4, GI bleed, Raynaud's, scleroderma with pulmonary hypertension, seizures, gout, smoker admitted on 06/03/2021 with recurrent seizures. Recent admission for C. diff colitis and metabolic acidosis 07/12/20-4/82/50. First seizure noted to be 05/28/21 with family noting some short term memory loss since seizures began. Also noted that they had not been able to obtain Briviact as outpatient yet.   Clinical Assessment and Goals of Care: I met today at [name] and his wife, [name], and 2 of his daughters at bedside. He is sleeping as he recently received seizure medication and Ativan. Family report that he was awake and speaking with them earlier prior to medication. He has 6 living children and very good support as much of the family pitch in to help [name] care for him at home. They have always been able to keep him at home and help him to rehab there. [name] reports that he has always been thin and has not lost much weight and he was doing well at home other than seizures. Family are very educated to his underlying medical issues and expectations. They are hopeful that seizures can be better controlled and that he will be able to return back home and they can continue to rehab him there.  We further discussed goals of care and preparing for the future. [name] speaks very openly that she is very aware that he will not live as long as she would like due to his complicated health conditions. However, she is hopeful that he can recover from this illness. She talks of his strong and  willful personality. She does understand that there will be a time that he will not be able to recover and at that time she would be open to DNR status and his comfort. She is familiar with palliative care as she had this with her mother. She does not feel that this is needed at this time but was open to the conversation and support. She did give me a copy of his Living Will which I will place in chart to be loaded into electronic records.   All questions/concerns addressed. Emotional support provided.   Primary Decision Maker HCPOA wife [place]   - Full code at this time - Goals clear for now - hopeful for improvement - Wife will call with further palliative needs  Code Status/Advance Care Planning: Full code   Symptom Management:  Per attending, neurology.   Palliative Prophylaxis:  Bowel Regimen, Delirium Protocol, Frequent Pain Assessment, and Turn Reposition  Additional Recommendations (Limitations, Scope, Preferences): Full Scope Treatment  Psycho-social/Spiritual:  Desire for further Chaplaincy support:no  Prognosis:  Overall prognosis poor with multiple co-morbidities.  Discharge Planning: Home with Home Health      Primary Diagnoses: Present on Admission:  Pulmonary hypertension ([place])  CKD (chronic kidney disease), stage IV ([place])  Hyperlipidemia   I have reviewed the medical record, interviewed the patient and family, and examined the patient. The following aspects are pertinent.  Past Medical History:  Diagnosis Date   (HFpEF) heart failure with preserved ejection fraction (HCC)    Anxiety    CAD (coronary artery disease)    CKD (chronic kidney disease), stage IV (HCC)    Gout    Hyperlipidemia    Hypertension 05/14/2012    Lexiscan-- EF 51% ,LV normal   Hypothyroid    MI (myocardial infarction) ([place]) 2010   Pericardial effusion 12/2008   Dr [name] performed a subxiphoid window removing 274m of fluid   Pericardial  effusion 03/09/2010   Echo-LVEF >55%, very small pericardial effusion ,,Stage 1 (impaired ) diastolic fxn, elevated LV filling   Pericarditis    Raynaud's phenomenon    [place]    [place]    Smoker 09/16/2018   1 ppd   Vitiligo    Social History   Socioeconomic History   Marital status: Married    Spouse name: SIvin Skinner  Number of children: 6   Years of education: 9   Highest education level: Not on file  Occupational History   Occupation: HDevelopment worker, community  Occupation: Disable, retired  Tobacco Use   Smoking status: Every Day    Packs/day: 1.00    Years: 38.00    Pack years: 38.00    Types: Cigarettes    Start date: 10/21/1971   Smokeless tobacco: Never  Vaping Use   Vaping Use: Never used  Substance and Sexual Activity   Alcohol use: Yes    Comment: h/o heavy use, has cut way back   Drug use: No   Sexual activity: Yes    Partners: Female  Other Topics Concern   Not on file  Social History Narrative   Lives with his wife and one daughter.   Social Determinants of Health   Financial Resource Strain: Not on file  Food Insecurity: Not on file  Transportation Needs: Not on file  Physical Activity: Not on file  Stress: Not on file  Social Connections: Not on file   Family History  Problem Relation Age of Onset   Lupus Mother    Cancer Mother        unknown per wife   Kidney failure Father    Autoimmune disease Sister    Lung disease Daughter    Colon cancer Neg Hx    Scheduled Meds:  calcium carbonate  200 mg of elemental calcium Oral BID   chlorhexidine gluconate (MEDLINE KIT)  15 mL Mouth Rinse BID   clonazePAM  0.5 mg Oral BID   heparin  5,000 Units Subcutaneous Q8H   macitentan  10 mg Oral Daily   mouth rinse  15 mL Mouth Rinse 10 times per day   nicotine  21 mg Transdermal Daily   phenytoin (DILANTIN) IV  100 mg Intravenous Q8H   rosuvastatin  20 mg Oral Daily   Continuous Infusions:  brivaracetam (BRIVIACT) IVPB 100 mg  (06/05/21 1521)   PRN Meds:.diazepam Allergies  Allergen Reactions   Oxycodone Other (See Comments)    Hallucinations   Review of Systems  Unable to perform ROS: Other  Constitutional:        Sedated currently from medication   Physical Exam  Vitals and nursing note reviewed.  Constitutional:      General: He is sleeping. He is not in acute distress.    Appearance: He is cachectic. He is ill-appearing.  Cardiovascular:     Rate and Rhythm: Tachycardia present.  Pulmonary:     Effort: No tachypnea, accessory muscle usage or respiratory distress.  Abdominal:     General: Abdomen is flat.  Neurological:     Mental Status: He is confused.    Vital Signs: BP (!) 157/92    Pulse (!) 102    Temp 97.6 F (36.4 C) (Oral)    Resp 16    Ht _0  (1.753 m)    Wt 52.2 kg    SpO2 90%    BMI 16.99 kg/m  Pain Scale: Faces   Pain Score: 0-No pain   SpO2: SpO2: 90 % O2 Device:SpO2: 90 % O2 Flow Rate: .O2 Flow Rate (L/min): 4 L/min  IO: Intake/output summary:  Intake/Output Summary (Last 24 hours) at 06/05/2021 1543 Last data filed at 06/04/2021 1845 Gross per 24 hour  Intake --  Output 500 ml  Net -500 ml    LBM: Last BM Date: 06/04/21 Baseline Weight: Weight: 52.2 kg Most recent weight: Weight: 52.2 kg     Palliative Assessment/Data:     Time Total: 60 min  Greater than 50%  of this time was spent counseling and coordinating care related to the above assessment and plan.  Signed by: Vinie Sill, NP Palliative Medicine Team Pager # [phone] (M-F 8a-5p) Team Phone # [phone] (Nights/Weekends)

## 2021-06-06 ENCOUNTER — Inpatient Hospital Stay (HOSPITAL_COMMUNITY): Payer: Medicare Other

## 2021-06-06 ENCOUNTER — Other Ambulatory Visit (HOSPITAL_COMMUNITY): Payer: Self-pay | Admitting: *Deleted

## 2021-06-06 DIAGNOSIS — N184 Chronic kidney disease, stage 4 (severe): Secondary | ICD-10-CM | POA: Diagnosis not present

## 2021-06-06 DIAGNOSIS — I272 Pulmonary hypertension, unspecified: Secondary | ICD-10-CM | POA: Diagnosis not present

## 2021-06-06 DIAGNOSIS — R569 Unspecified convulsions: Secondary | ICD-10-CM | POA: Diagnosis not present

## 2021-06-06 DIAGNOSIS — I5033 Acute on chronic diastolic (congestive) heart failure: Secondary | ICD-10-CM | POA: Diagnosis not present

## 2021-06-06 LAB — ECHOCARDIOGRAM COMPLETE
AR max vel: 2.75 cm2
AV Area VTI: 2.89 cm2
AV Area mean vel: 2.67 cm2
AV Mean grad: 4 mmHg
AV Peak grad: 8.3 mmHg
Ao pk vel: 1.44 m/s
Area-P 1/2: 9.85 cm2
Calc EF: 27.4 %
Height: 69 in
MV M vel: 4.73 m/s
MV Peak grad: 89.5 mmHg
S' Lateral: 4 cm
Single Plane A2C EF: 17.1 %
Single Plane A4C EF: 33.5 %
Weight: 1841.28 oz

## 2021-06-06 LAB — BASIC METABOLIC PANEL
Anion gap: 17 — ABNORMAL HIGH (ref 5–15)
BUN: 26 mg/dL — ABNORMAL HIGH (ref 8–23)
CO2: 20 mmol/L — ABNORMAL LOW (ref 22–32)
Calcium: 7.4 mg/dL — ABNORMAL LOW (ref 8.9–10.3)
Chloride: 107 mmol/L (ref 98–111)
Creatinine, Ser: 2.65 mg/dL — ABNORMAL HIGH (ref 0.61–1.24)
GFR, Estimated: 26 mL/min — ABNORMAL LOW (ref >60)
Glucose, Bld: 76 mg/dL (ref 70–99)
Potassium: 3.6 mmol/L (ref 3.5–5.1)
Sodium: 144 mmol/L (ref 135–145)

## 2021-06-06 LAB — CBC WITH DIFFERENTIAL/PLATELET
Abs Immature Granulocytes: 0.01 10*3/uL (ref 0.00–0.07)
Basophils Absolute: 0 10*3/uL (ref 0.0–0.1)
Basophils Relative: 0 %
Eosinophils Absolute: 0.1 10*3/uL (ref 0.0–0.5)
Eosinophils Relative: 2 %
HCT: 30.5 % — ABNORMAL LOW (ref 39.0–52.0)
Hemoglobin: 8.7 g/dL — ABNORMAL LOW (ref 13.0–17.0)
Immature Granulocytes: 0 %
Lymphocytes Relative: 20 %
Lymphs Abs: 0.9 10*3/uL (ref 0.7–4.0)
MCH: 29.6 pg (ref 26.0–34.0)
MCHC: 28.5 g/dL — ABNORMAL LOW (ref 30.0–36.0)
MCV: 103.7 fL — ABNORMAL HIGH (ref 80.0–100.0)
Monocytes Absolute: 0.5 10*3/uL (ref 0.1–1.0)
Monocytes Relative: 11 %
Neutro Abs: 3.1 10*3/uL (ref 1.7–7.7)
Neutrophils Relative %: 67 %
Platelets: 210 10*3/uL (ref 150–400)
RBC: 2.94 MIL/uL — ABNORMAL LOW (ref 4.22–5.81)
RDW: 18.3 % — ABNORMAL HIGH (ref 11.5–15.5)
WBC: 4.6 10*3/uL (ref 4.0–10.5)
nRBC: 0 % (ref 0.0–0.2)

## 2021-06-06 LAB — MAGNESIUM
Magnesium: 1.7 mg/dL (ref 1.7–2.4)
Magnesium: 1.6 mg/dL — ABNORMAL LOW (ref 1.7–2.4)

## 2021-06-06 LAB — GLUCOSE, CAPILLARY
Glucose-Capillary: 138 mg/dL — ABNORMAL HIGH (ref 70–99)
Glucose-Capillary: 59 mg/dL — ABNORMAL LOW (ref 70–99)
Glucose-Capillary: 73 mg/dL (ref 70–99)
Glucose-Capillary: 83 mg/dL (ref 70–99)
Glucose-Capillary: 84 mg/dL (ref 70–99)
Glucose-Capillary: 94 mg/dL (ref 70–99)

## 2021-06-06 LAB — PHOSPHORUS
Phosphorus: 4.5 mg/dL (ref 2.5–4.6)
Phosphorus: 3.6 mg/dL (ref 2.5–4.6)

## 2021-06-06 LAB — VITAMIN B12: Vitamin B-12: 455 pg/mL (ref 180–914)

## 2021-06-06 LAB — CALCIUM, IONIZED: Calcium, Ionized, Serum: 4.3 mg/dL — ABNORMAL LOW (ref 4.5–5.6)

## 2021-06-06 IMAGING — DX DG CHEST 1V PORT
1 series · 1 of 1 positions shown · non-contrast
Comparison: [DATE] and CT chest [DATE].

CLINICAL DATA: Pleural effusions.

EXAM:
PORTABLE CHEST 1 VIEW

[chest ap]
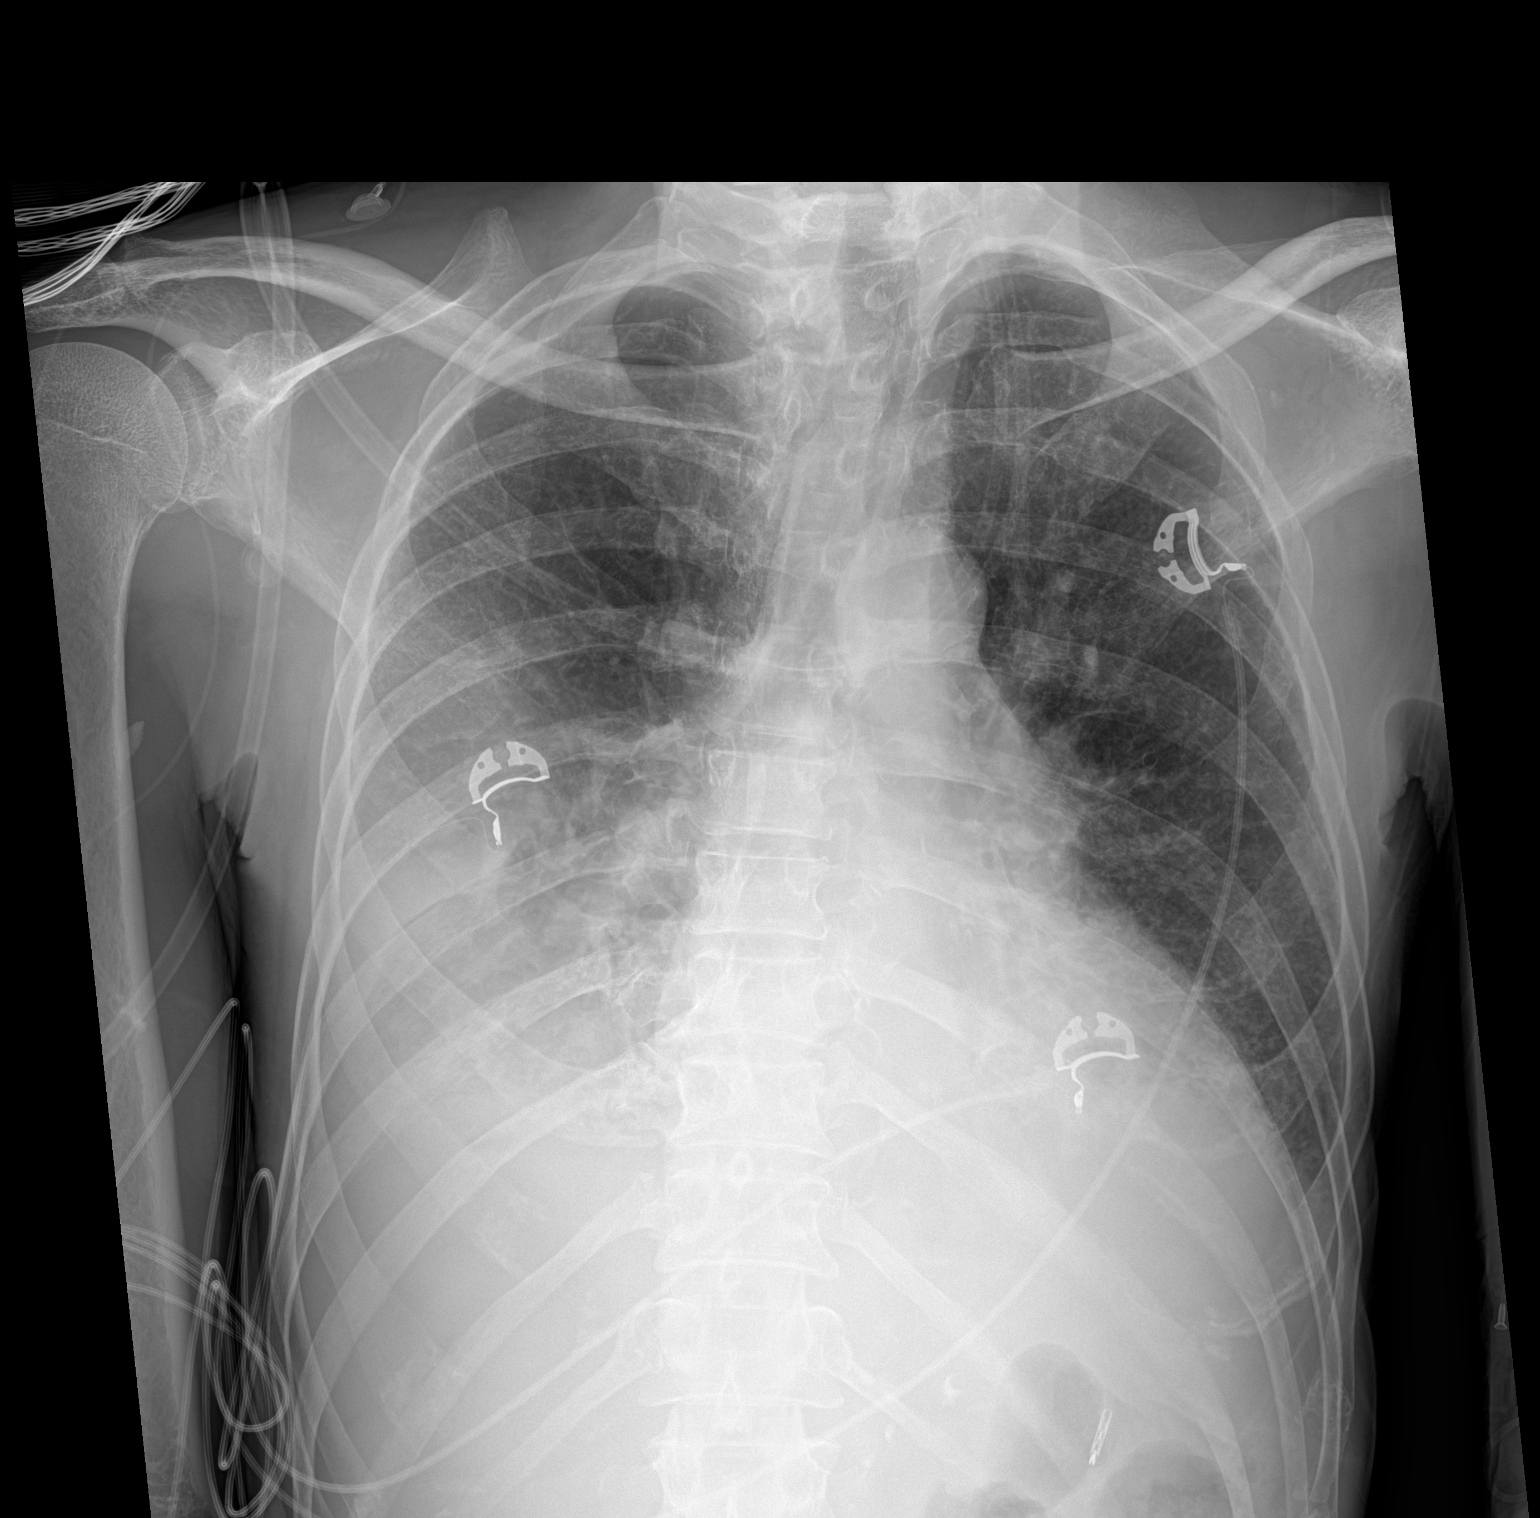

[1 of 1 positions shown; findings below may reference images not displayed]

FINDINGS: Trachea is midline. Heart size stable. Moderate right pleural
effusion with bibasilar airspace opacification. Small left pleural
effusion.
IMPRESSION: Bilateral pleural effusions, right greater than left, with bibasilar
atelectasis and/or pneumonia.

## 2021-06-06 IMAGING — DX DG ABD PORTABLE 1V
1 series · 1 of 1 positions shown · non-contrast
Comparison: [DATE]

CLINICAL DATA: Feeding tube placement

EXAM:
PORTABLE ABDOMEN - 1 VIEW

[abdomen kub]
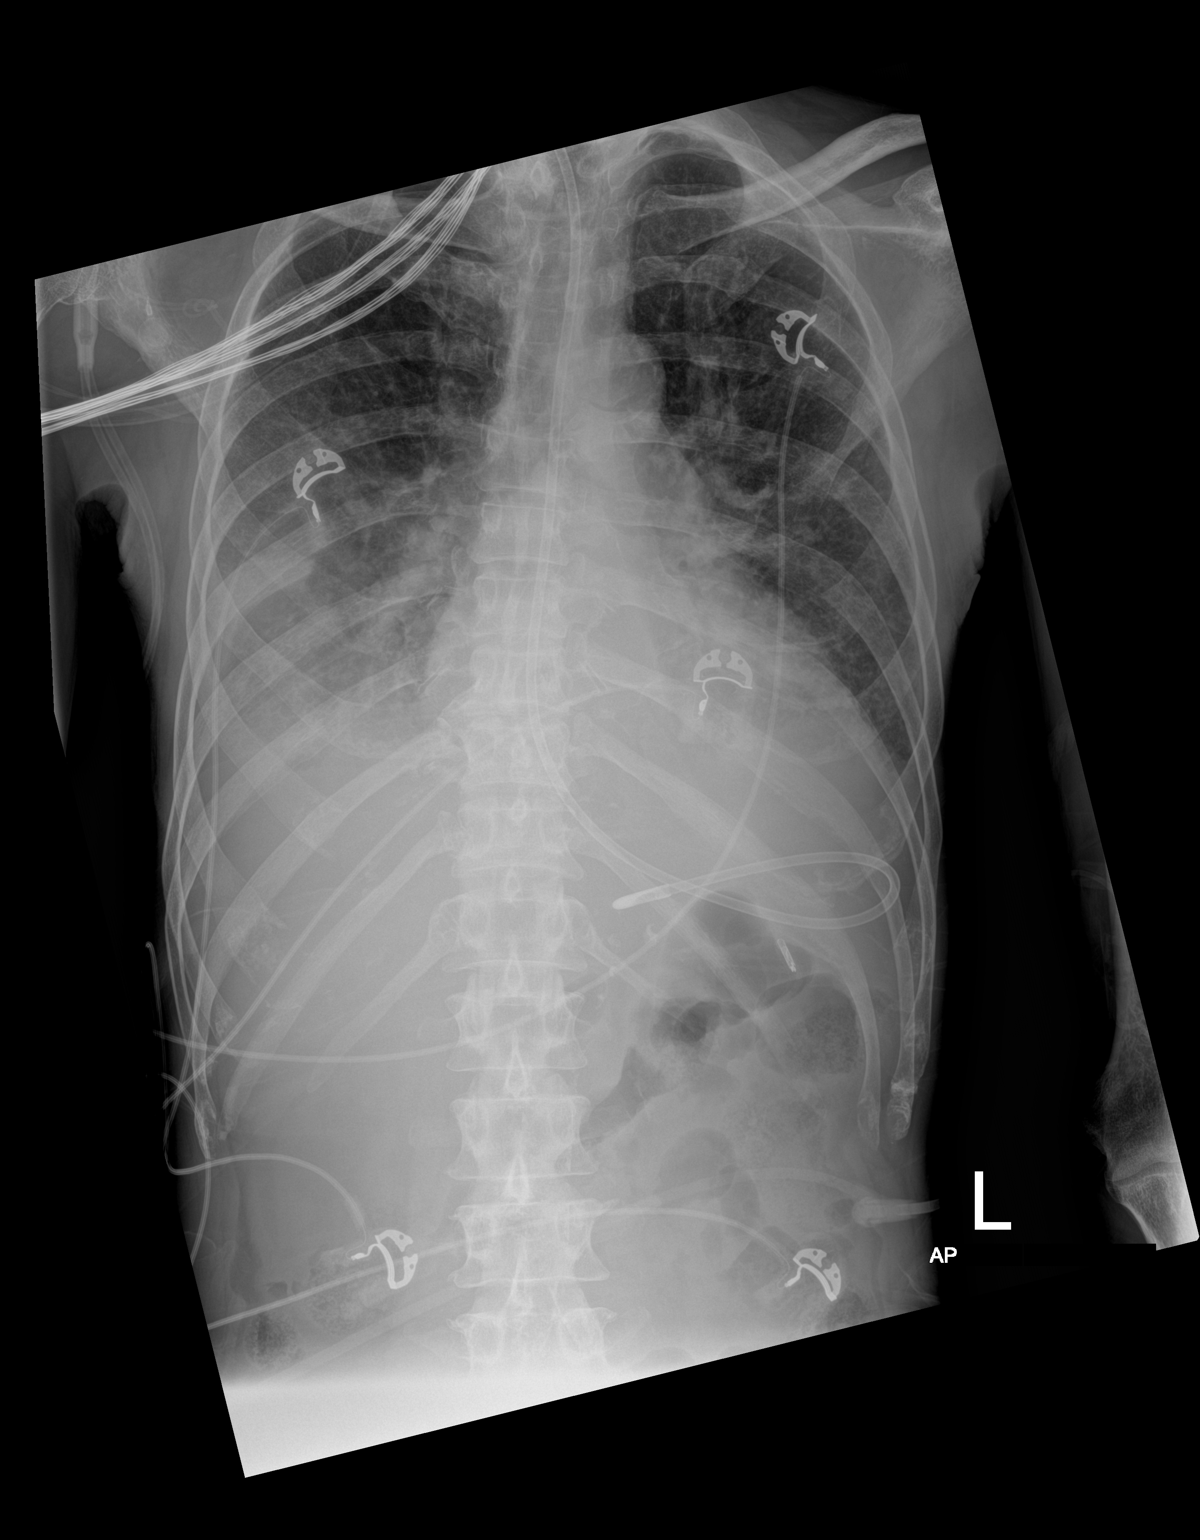

[1 of 1 positions shown; findings below may reference images not displayed]

FINDINGS: Enteric tube tip overlies the stomach. Pleural effusions and basilar
airspace disease. Cardiomegaly.
IMPRESSION: Enteric tube tip overlies the stomach.

## 2021-06-06 MED ORDER — NEPRO/CARBSTEADY PO LIQD: 237.0000 mL | Freq: Two times a day (BID) | ORAL | Status: DC

## 2021-06-06 MED ORDER — OSMOLITE 1.2 CAL PO LIQD
1000.0000 mL | ORAL | Status: DC
  Administered 2021-06-06: 1000 mL

## 2021-06-06 MED ORDER — ADULT MULTIVITAMIN W/MINERALS CH
1.0000 | ORAL_TABLET | Freq: Every day | ORAL | Status: DC
  Filled 2021-06-06: qty 1

## 2021-06-06 MED ORDER — DEXTROSE 50 % IV SOLN
INTRAVENOUS | Status: AC
  Administered 2021-06-06: 50 mL
  Filled 2021-06-06: qty 50

## 2021-06-06 MED ORDER — DEXTROSE-NACL 5-0.9 % IV SOLN: INTRAVENOUS | Status: DC

## 2021-06-06 MED ORDER — SODIUM CHLORIDE 0.9 % IV SOLN
50.0000 mg | Freq: Two times a day (BID) | INTRAVENOUS | Status: DC
  Administered 2021-06-06 – 2021-06-09 (×7): 50 mg via INTRAVENOUS
  Filled 2021-06-06 (×9): qty 5

## 2021-06-06 NOTE — Progress Notes (Signed)
[employer] [place] provided EEG maint; no skin breakdown at fp1, fp2, a1, & EKG.

## 2021-06-06 NOTE — Procedures (Signed)
EEG Procedure CPT/Type of Study: 86381; 24hr EEG with video Referring Provider: Vijaya Primary Neurological Diagnosis: status epilepticus  History: This is a 61 yr old patient, undergoing an EEG to evaluate for new onset seizures, status epilepticus. Clinical State: disoriented  Technical Description:  The EEG was performed using standard setting per the guidelines of American Clinical Neurophysiology Society (ACNS).  A minimum of 21 electrodes were placed on scalp according to the International 10-20 or/and 10-10 Systems. Supplemental electrodes were placed as needed. Single EKG electrode was also used to detect cardiac arrhythmia. Patient's behavior was continuously recorded on video simultaneously with EEG. A minimum of 16 channels were used for data display. Each epoch of study was reviewed manually daily and as needed using standard referential and bipolar montages. Computerized quantitative EEG analysis (such as compressed spectral array analysis, trending, automated spike & seizure detection) were used as indicated.   Day 3: from 0730 06/05/21 to 0730 06/06/21  EEG Description: Overall Amplitude:Normal Predominant Frequency: The background activity showed posterior dominant alpha, with about 9 Hz, that was frequent. Superimposed Frequencies: occasional theta and some beta activity over the left The background was asymmetric, decreased background over the right  Background Abnormalities: Focal slowing: right hemisphere focal delta-theta Rhythmic or periodic pattern: Lateralized Periodic Discharges: right frontal spiky LPDs, 1Hz, with improved morphology in the afternoon (less overriding fast) Epileptiform activity: Yes; spiky morphology to LPDs right frontal Electrographic seizures: No Events: no   Breach rhythm: no  Reactivity: Present  Stimulation procedures:  Hyperventilation: not done Photic stimulation: not done  Sleep Background: Stage II  EKG:no significant  arrhythmia  Impression: This was an abnormal continuous video EEG due to right frontal LPDs with less overriding fast activity in the evening. No ictal evolution was seen.

## 2021-06-06 NOTE — Progress Notes (Addendum)
Transition of Care [facility]) Screening Note   Patient Details  Name: [name] Date of Birth: Aug 27, 1960   [facility]) CM/SW Contact:    Pollie Friar, RN Phone Number: 06/06/2021, 3:37 PM   CIR has consulted TOC as pt is not a candidate for them.  Transition of Care Department [facility]) has reviewed patient. We will continue to monitor patient advancement through interdisciplinary progression rounds. If new patient transition needs arise, please place a TOC consult.

## 2021-06-06 NOTE — Progress Notes (Addendum)
Subjective: Interval History: Patient appears to be sedated.  He is currently getting echocardiogram completed.  His wife and daughter at the bedside.  She is concerned about amount of antibiotics being given.  Discussed with her that this is needed until cultures come back but will stop as soon as we can.  Objective: Vital signs in last 24 hours: Temp:  [97.5 F (36.4 C)-97.8 F (36.6 C)] 97.8 F (36.6 C) (02/13 0750) Pulse Rate:  [76-98] 76 (02/13 0300) Resp:  [14-16] 16 (02/13 0300) BP: (110-134)/(62-76) 117/69 (02/13 0750) SpO2:  [94 %-98 %] 98 % (02/13 0750)  Intake/Output from previous day: 02/12 0701 - 02/13 0700 In: 250 [IV Piggyback:250] Out: 0  Intake/Output this shift: No intake/output data recorded. Nutritional status:  Diet Order             Diet NPO time specified  Diet effective now                   General appearance: no distress.  Laying in bed getting echocardiogram completed. Respiratory: Nonlabored.  Neuro: He appears to be sedated.  He did stick his tongue out for me.  He did not follow any other commands.  He keeps his eyes shut.  Pupils equal round reactive. Extremities: Withdraws to pain.   Lab Results: Recent Labs    06/05/21 0209 06/06/21 0233  WBC 5.0 4.6  HGB 7.3* 8.7*  HCT 24.9* 30.5*  PLT 180 210  NA 143 144  K 3.6 3.6  CL 106 107  CO2 24 20*  GLUCOSE 89 76  BUN 25* 26*  CREATININE 2.77* 2.65*  CALCIUM 7.7* 7.4*   Lipid Panel No results for input(s): CHOL, TRIG, HDL, CHOLHDL, VLDL, LDLCALC in the last 72 hours.  Studies/Results: MR BRAIN WO CONTRAST  Result Date: 06/04/2021 CLINICAL DATA:  New onset seizure.  Altered mental status. EXAM: MRI HEAD WITHOUT CONTRAST TECHNIQUE: Multiplanar, multiecho pulse sequences of the brain and surrounding structures were obtained without intravenous contrast. COMPARISON:  06/03/2021 MRI and CT. FINDINGS: Brain: Again the study is limited by patient confusion and motion. The study  confirms brain swelling and increased T2 signal without definite true restricted diffusion within the mesial right temporal lobe, right hippocampus and right mesial thalamus. Findings are presumed to be postictal rather than subsequent to ischemic infarction. Otherwise, the brain appears normal. No evidence of old infarction, mass, hydrocephalus or extra-axial collection. There may be a developmental venous anomaly in the right basal ganglia and radiating white matter tracts. Vascular: Major vessels at the base of the brain show flow. Skull and upper cervical spine: Negative Sinuses/Orbits: Clear/negative Other: None IMPRESSION: Limited and motion degraded examination. Redemonstration of increased T2 signal affecting the right mesial temporal lobe, hippocampus and right mesial thalamus, presumed to be postictal change. I do not see low signal on the ADC map to suggest actual brain infarction. Question developmental venous anomaly in the right basal ganglia and radiating white matter tracts. Electronically Signed   By: Nelson Chimes M.D.   On: 06/04/2021 20:30    Medications: I have reviewed the patient's current medications. Scheduled:  calcium carbonate  200 mg of elemental calcium Oral BID   chlorhexidine gluconate (MEDLINE KIT)  15 mL Mouth Rinse BID   heparin  5,000 Units Subcutaneous Q8H   LORazepam  2 mg Intravenous Q8H   macitentan  10 mg Oral Daily   mouth rinse  15 mL Mouth Rinse 10 times per day   nicotine  21  mg Transdermal Daily   phenytoin (DILANTIN) IV  100 mg Intravenous Q8H   rosuvastatin  20 mg Oral Daily   Continuous:  acyclovir (ZOVIRAX) </= 700 mg IVPB 500 mg (06/05/21 2034)   ampicillin (OMNIPEN) IV 2 g (06/06/21 0006)   brivaracetam (BRIVIACT) IVPB 100 mg (06/05/21 2200)   cefTRIAXone (ROCEPHIN)  IV 2 g (06/06/21 0657)   lacosamide (VIMPAT) IV     vancomycin     IVH:[id number]  Assessment/Plan: MRI brain 2/11: - Limited and motion degraded examination. Redemonstration of  increased T2 signal affecting the right mesial temporal lobe, hippocampus and right mesial thalamus, presumed to be postictal change. I do not see low signal on the ADC map to suggest actual brain infarction. - Question developmental venous anomaly in the right basal ganglia and radiating white matter tracts.   Overnight EEG 2/11 - 2/12: "This was an abnormal continuous video EEG due to right frontal LPD-plus activity without any further ictal evolution."  2/12 - 2/13:This was an abnormal continuous video EEG due to right frontal LPDs with less overriding fast activity in the evening. No ictal evolution was seen.    Assessment: 61 y.o. male who presented to the ED for evaluation of multiple seizures today at home with concern for LPDs on outpatient EEG today. Recently discharged from the ED on 2/4 after his first seizure with outpatient neurology follow up. Initial ED work up revealed a magnesium of 1.0 and ionized calcium of 0.86.  He had LP completed awaiting cultures.  Currently on antibiotics coverage and antiviral.  Overnight EEG continues to show abnormal right frontal LPD's.  Discussed with reading neurologist and recommended increasing seizure medications.   Recommendations: - Continue LTM EEG-still showing LPD's overnight. - Continue seizure precautions - Continue Briviact 180m BID and phenytoin 100 mg q8h IV --Add Vimpat 50 mg IV twice daily.  Keep at a low dose due to creatinine. -Check levels. - 2 mg IV Ativan PRN for seizure activity lasting > 5 minutes and notify neurology -Follow CSF studies.  Culture still pending we will continue to follow - Delirium precautions -follow autoimmune and biofire labs sent by Dr. KLeonel Ramsay- Neurology will continue to follow  Discussed plan with patient's wife who is a caretaker.   LOS: 3 days   [name]   Total of 35 mins spent reviewing chart, discussion with patient and family on prognosis, Dx and plan. Discussed case with  patient's nurse. Reviewed Imaging personally.

## 2021-06-06 NOTE — Progress Notes (Signed)
Inpatient Rehab Admissions Coordinator Note:   Per therapy recommendations, patient was screened for CIR candidacy by Michel Santee, PT. Note that pt does not have a qualifying diagnosis with seizures, and [employer] unlikely to approve CIR admission.  Would recommend TOC follow up for referral to another level of care (SNF versus HH, per therapy discretion).    Shann Medal, PT, DPT [phone] 06/06/21 12:08 PM

## 2021-06-06 NOTE — Progress Notes (Addendum)
Initial Nutrition Assessment  DOCUMENTATION CODES:  Severe malnutrition in context of chronic illness, Underweight  INTERVENTION:  Advance diet as medically able and as tolerated.  If diet does not advance in the next 24 hrs, strongly consider nutrition support.  Once diet advances, add Nepro Shake po BID, each supplement provides 425 kcal and 19 grams protein.  Add MVI with minerals daily.  Encourage PO and supplement intake.  NUTRITION DIAGNOSIS:  Severe Malnutrition related to chronic illness (CHF, CKD) as evidenced by severe fat depletion, severe muscle depletion.  GOAL:  Patient will meet greater than or equal to 90% of their needs  MONITOR:  Diet advancement, PO intake, Supplement acceptance, Labs, Weight trends, Skin, I & O's  REASON FOR ASSESSMENT:  Consult Assessment of nutrition requirement/status  ASSESSMENT:  61 yo male with a PMH of CAD, CHF, hypertension, CKD stage IV, Raynard's, GI bleed, and recent history of seizures who presents with recurrent seizures.  Pt NPO at current due to lethargy - SLP to attempt to visit again later if patient is more alert.  Spoke with wife and daughter at bedside. Wife reports that pt was eating fairly well before last Friday when his seizures started.  She also reports his weight has not changed much recently, that he always been small. Recently, he has been weighing around 115-120 lbs. He prefers to weigh around 125 lbs. The highest he ever weighed was around 140 lbs.  Per Epic, pt's weight remains stable over the past 3.5 months.  Given that pt is severely malnourished with a low BMI, recommend that if diet does not advance to at least full liquids in the next 24 hours, a small-bore NGT or Cortrak tube should be placed and TF should be initiated.   Pt also at risk for refeeding syndrome.  Medications: reviewed; Tums BID, Ativan TID, Dilantin TID, IV ABX x6  Labs: reviewed; CBG 59-98  NUTRITION - FOCUSED PHYSICAL  EXAM: Flowsheet Row Most Recent Value  Orbital Region Severe depletion  Upper Arm Region Severe depletion  Thoracic and Lumbar Region Severe depletion  Buccal Region Severe depletion  Temple Region Severe depletion  Clavicle Bone Region Severe depletion  Clavicle and Acromion Bone Region Severe depletion  Scapular Bone Region Severe depletion  Dorsal Hand Severe depletion  Patellar Region Severe depletion  Anterior Thigh Region Severe depletion  Posterior Calf Region Severe depletion  Edema (RD Assessment) None  Hair Reviewed  Eyes Unable to assess  Mouth Unable to assess  Skin Reviewed  Nails Reviewed   Diet Order:   Diet Order             Diet NPO time specified  Diet effective now                  EDUCATION NEEDS:  Education needs have been addressed  Skin:  Skin Assessment: Skin Integrity Issues: Skin Integrity Issues:: Stage II Stage II: Mid sacrum  Last BM:  06/04/21 - Type 6, medium  Height:  Ht Readings from Last 1 Encounters:  06/03/21 _0  (1.753 m)   Weight:  Wt Readings from Last 1 Encounters:  06/03/21 52.2 kg   BMI:  Body mass index is 16.99 kg/m.  Estimated Nutritional Needs:  Kcal:  1800-200 Protein:  90-105 grams Fluid:  >1.8 L  Derrel Nip, RD, LDN (she/her/hers) Clinical Inpatient Dietitian RD Pager/After-Hours/Weekend Pager # in [place]

## 2021-06-06 NOTE — Progress Notes (Signed)
SLP Cancellation Note  Patient Details Name: [name] MRN: [id number] DOB: 12-May-1960   Cancelled treatment:       Reason Eval/Treat Not Completed: Patient's level of consciousness;Other (comment) Pts family at bedside request SLP return when pt alert, wants him to rest now. RN can secure chat this SLP, though after 130 will not be available.    Reatha Sur, Katherene Ponto 06/06/2021, 10:42 AM

## 2021-06-06 NOTE — Discharge Instructions (Signed)
Suggestions For Increasing Calories And Protein Several small meals a day are easier to eat and digest than three large ones. Space meals about 2 to 3 hours apart to maximize comfort. Stop eating 2 to 3 hours before bed and sleep with your head elevated if gastric reflux (GERD) and heartburn are problems. Do not eat your favorite foods if you are feeling bad. Save them for when you feel good! Eat breakfast-type foods at any meal. Eggs are usually easy to eat and are great any time of the day. (The same goes for pancakes and waffles.) Eat when you feel hungry. Most people have the greatest appetite in the morning because they have not eaten all night. If this is the best meal for you, then pile on those calories and other nutrients in the morning and at lunch. Then you can have a smaller dinner without losing total calories for the day. Eat leftovers or nutritious snacks in the afternoon and early evening to round out your day. Try homemade or commercially prepared nutrition bars and puddings, as well as calorie- and protein-rich liquid nutritional supplements. Benefits of Physical Activity Talk to your doctor about physical activity. Light or moderate physical activity can help maintain muscle and promote an appetite. Walking in the neighborhood or the local mall is a great way to get up, get out, and get moving. If you are unsteady on your feet, try walking around the dining room table. Save Room for [employer]! Drink most fluids between meals instead of with meals. (It is fine to have a sip to help swallow food at meal time.) Fluids (which usually have fewer calories and nutrients than solid food) can take up valuable space in your stomach.  Foods Recommended High-Protein Foods Milk products Add cheese to toast, crackers, sandwiches, baked potatoes, vegetables, soups, noodles, meat, and fruit. Use reduced-fat (2%) or whole milk in place of water when cooking cereal and cream soups. Include  cream sauces on vegetables and pasta. Add powdered milk to cream soups and mashed potatoes.  Eggs Have hard-cooked eggs readily available in the refrigerator. Chop and add to salads, casseroles, soups, and vegetables. Make a quick egg salad. All eggs should be well cooked to avoid the risk of harmful bacteria.  Meats, poultry, and fish Add leftover cooked meats to soups, casseroles, salads, and omelets. Make dip by mixing diced, chopped, or shredded meat with sour cream and spices.  Beans, legumes, nuts, and seeds Sprinkle nuts and seeds on cereals, fruit, and desserts such as ice cream, pudding, and custard. Also serve nuts and seeds on vegetables, salads, and pasta. Spread peanut butter on toast, bread, English muffins, and fruit, or blend it in a milk shake. Add beans and peas to salads, soups, casseroles, and vegetable dishes.  High-Calorie Foods Butter, margarine, and  oils Melt butter or margarine over potatoes, rice, pasta, and cooked vegetables. Add melted butter or margarine into soups and casseroles and spread on bread for sandwiches before spreading sandwich spread or peanut butter. Saut or stir-fry vegetables, meats, chicken and fish such as shrimp/scallops in olive or canola oil. A variety of oils add calories and can be used to [employer], chicken, or fish.  Milk products Add whipping cream to desserts, pancakes, waffles, fruit, and hot chocolate, and fold it into soups and casseroles. Add sour cream to baked potatoes and vegetables.  Salad dressing Use regular (not low-fat or diet) mayonnaise and salad dressing on sandwiches and in dips with vegetables and fruit.  Sweets Add jelly and honey to bread and crackers. Add jam to fruit and ice cream and as a topping over cake.   Copyright 2020  Academy of [employer] Dietetics. All rights reserved.
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
## 2021-06-06 NOTE — Progress Notes (Signed)
Phenytoin Initial Consult Indication: New onset refractory seizures  Allergies  Allergen Reactions   Oxycodone Other (See Comments)    Hallucinations    Patient Measurements: Height: 5' 9" (175.3 cm) Weight: 52.2 kg (115 lb 1.3 oz) IBW/kg (Calculated) : 70.7 TPN AdjBW (KG): 52.2 Body mass index is 16.99 kg/m.   Vital signs: Temp: 97.8 F (36.6 C) (02/13 0750) Temp Source: Oral (02/13 0750) BP: 117/69 (02/13 0750) Pulse Rate: 76 (02/13 0300)  Labs: No results found for: ALBUMIN, PHENYTOIN, PHENYTFREE No results found for: PHENYTOIN, PHENOBARB, VALPROATE, CBMZ Estimated Creatinine Clearance: 21.3 mL/min (A) (by C-G formula based on SCr of 2.65 mg/dL (H)).   Medications:  Medications Prior to Admission  Medication Sig Dispense Refill Last Dose   allopurinol (ZYLOPRIM) 100 MG tablet TAKE 1 TABLET BY MOUTH EVERY DAY (Patient taking differently: Take 100 mg by mouth daily.) 30 tablet 2 06/03/2021   ALPRAZolam (XANAX) 0.5 MG tablet Take 0.25 mg by mouth 2 (two) times daily as needed for anxiety.   Past Month   calcium carbonate (TUMS - DOSED IN MG ELEMENTAL CALCIUM) 500 MG chewable tablet Chew 1 tablet by mouth 2 (two) times daily with a meal. 20 tablet 0 06/03/2021   colchicine 0.6 MG tablet Take 0.6 mg by mouth daily as needed (gout attacks).   Past Month   Epoetin Alfa-epbx (RETACRIT IJ) Inject as directed every 30 (thirty) days.   05/07/2021   HYDROcodone-acetaminophen (NORCO) 10-325 MG tablet Take 0.5-1 tablets by mouth every 6 (six) hours as needed for moderate pain.   Past Month   macitentan (OPSUMIT) 10 MG tablet Take 1 tablet (10 mg total) by mouth daily. 90 tablet 3 06/03/2021   methocarbamol (ROBAXIN) 500 MG tablet Take 1 tablet (500 mg total) by mouth 2 (two) times daily as needed for muscle spasms. 60 tablet 0 Unknown   multivitamin-iron-minerals-folic acid (CENTRUM) chewable tablet Chew 1 tablet by mouth daily.   06/03/2021   pantoprazole (PROTONIX) 40 MG tablet Take 1  tablet (40 mg total) by mouth daily for 90 doses. 90 tablet 3 06/03/2021   potassium chloride SA (KLOR-CON M20) 20 MEQ tablet Take 1 tablet (20 mEq total) by mouth daily. 10 tablet 0 06/03/2021   rosuvastatin (CRESTOR) 20 MG tablet Take 20 mg by mouth daily.   06/03/2021   sildenafil (REVATIO) 20 MG tablet Take 2 tablets (40 mg total) by mouth 3 (three) times daily. (Patient taking differently: Take 40 mg by mouth in the morning and at bedtime.) 180 tablet 11 06/03/2021   sodium bicarbonate 650 MG tablet Take 2 tablets (1,300 mg total) by mouth 3 (three) times daily. 30 tablet 0 06/03/2021   torsemide (DEMADEX) 20 MG tablet Take 20 mg by mouth daily.   06/03/2021   Brivaracetam (BRIVIACT) 50 MG TABS Take 1 tablet twice a day 60 tablet 5     Assessment: New seizure activity on 05/28/21. 2 weeks prior was admitted/treated for C diff and metabolic acidosis. He had a negative CT head and lab work notable for corrected calcium of 7.1. Was discharged with calcium supplement and neurology follow up. Has since had additional seizure type activity, saw outpatient neurology 2/7, prescribed Briviact, not yet started d/t unavailability. On 2/10, had 1 hr EEG and following this had a tonic clonic seizure, EMS called and he had another on the way to hospital.   2/10:  - Keppra 2 grams IV x 1 - coCa 7.9 >>calgluc 2 g IV x 1 - Mag 1 >>  Mag IV 2 g x 1 2/11:  - Fosphenytoin LD 1044 mg x 1 - Phenytoin 100 mg every 8 hours - Briviact 50 mg IV x 1 2/12:  - Briviact 100 mg IV every 12 hours - Ativan 2 mg IV every 8 hours 2/13:  - Calcium 7.4 (coCa 8.8) - Vimpat 50 mg IV every 12 hours  Significant potential drug interactions: Induction of CYP3A4 by phenytoin may increase the metabolic elimination of macitentan.   Goals of care:  Total phenytoin level: 10-20 mcg/ml Free phenytoin level: 1-2 mcg/ml  Plan:  Check total phenytoin level on 2/14 am Adjust dosing as appropriate Pharmacy will continue to follow  regarding obtaining total phenytoin levels and dose adjustments as indicated.    Thank you for allowing [place] to participate in this patients care. Jens Som, PharmD 06/06/2021 1:09 PM  **Pharmacist phone directory can be found on [email or web address] listed under [place]**

## 2021-06-06 NOTE — Procedures (Signed)
Cortrak  Person Inserting Tube:  Jaquise Faux, Creola Corn, RD Tube Type:  Cortrak - 43 inches Tube Size:  10 Tube Location:  Left nare Initial Placement:  Stomach Secured by: Bridle Technique Used to Measure Tube Placement:  Marking at nare/corner of mouth Cortrak Secured At:  69 cm  Cortrak Tube Team Note:  Consult received to place a Cortrak feeding tube.   X-ray is required, abdominal x-ray has been ordered by the Cortrak team. Please confirm tube placement before using the Cortrak tube.   If the tube becomes dislodged please keep the tube and contact the Cortrak team at [email or web address] (password [email or web address]) for replacement.  If after hours and replacement cannot be delayed, place a NG tube and confirm placement with an abdominal x-ray.     Theone Stanley., MS, RD, LDN (she/her/hers) RD pager number and weekend/on-call pager number located in [place].

## 2021-06-06 NOTE — Progress Notes (Signed)
Brief Nutrition Note  Consult received for enteral/tube feeding initiation and management. Pt was seen earlier today by RD who recommended considering nutrition support. Cortrak was placed by Cortrak team.  Adult Enteral Nutrition Protocol initiated. Full follow-up assessment to follow.  Admitting Dx: Seizure ([place]) [R56.9]  Body mass index is 16.99 kg/m. Pt meets criteria for underweight based on current BMI.  Labs:  Recent Labs  Lab 06/04/21 0903 06/05/21 0209 06/06/21 0233  NA 140 143 144  K 3.5 3.6 3.6  CL 103 106 107  CO2 22 24 20*  BUN 24* 25* 26*  CREATININE 2.71* 2.77* 2.65*  CALCIUM 7.6* 7.7* 7.4*  MG 1.6* 1.8 1.7  PHOS 4.2 4.2 4.5  GLUCOSE 57* 89 76    Gustavus Bryant, MS, RD, LDN Inpatient Clinical Dietitian Please see AMiON for contact information.

## 2021-06-06 NOTE — Progress Notes (Signed)
[facility]                                                                               [name], is a 61 y.o. male, DOB - [birth year], NOM:[id number] Admit date - 06/03/2021    Outpatient Primary MD for the patient is [name], Ravisankar, MD  LOS - 3  days    Brief summary   [name] is a 61 y.o. male with medical history significant of CAD, CHF, hypertension, CKD stage IV, Raynard's, GI bleed, recent history of seizures who presents with recurrent seizures.   Assessment & Plan    Assessment and Plan:  Recurrent seizures:  Continues to have tonic clonic seizures.  - seen by neurology and is on continuous EEG monitoring.  - probably triggered by electrolyte imbalance in the setting of baseline seizure activity.  - resume Briviact 100 mg BID. IV vimpat 50 mg every 12 hours, phenytoin 100 mg every 8 hours and scheduled ativan.  - MRI brain without contrast repeated and showed Limited and motion degraded examination. Redemonstration of increased T2 signal affecting the right mesial temporal lobe, hippocampus and right mesial thalamus, presumed to be postictal change. LP done yesterday and he was started on IV antibiotics including rocephin , ampicillin and acyclovir and IV vancomycin.      Hypomagnesemia:  Replaced , repeat levels are wnl.   Mild acute on Chronic diastolic heart failure:  Appears to have resolved. He appears euvolemic. He was on IV lasix 60 mg  (2 doses given). Since he is NPO and not eating much or drinking much. Will d/c IV lasix.  CXR on admission showed moderate right pleural effusion .  Unable to check urine output due to incontinence.  Echocardiogram showed Left ventricular ejection fraction, 40 to 45% with mildly decreased function, and  global hypokinesis and mild left ventricular hypertrophy. Left ventricular diastolic parameters  are consistent with Grade II diastolic dysfunction (pseudonormalization).  Elevated left atrial  pressure    Hypocalcemia:  Corrected calcium level is 9.1 with an albumin of 2.2.   Recent C diff colitis:  Appears to have resolved.  Pt denies any abd pain, diarrhea.    Hyperlipidemia:  On statin which is on hold.    Hypertension:  BP parameters are optimal.    Stage 4 CKD:  Creatinine so far appears to be at baseline.  Creatinine at 2.7.    Acute metabolic and toxic encephalopathy:  - due to recurrent seizures and delirious and disoriented due to IV ativan .  - he remains lethargic and is high risk for aspiration.  - cortrak to be placed for nutrition and dietary on board to start the tube feeds.  - since he is hypoglycemic, will start him on dextrose NS fluids at 40 ml/hr till we get the cortrak in.    Tobacco abuse:  - started on nicotine patch daily. - pt smokes a pack of cigarettes a day.   Pulmonary Hypertension:  Resume Opsumit   Anemia of chronic disease:  Normocytic.  Baseline between 7 to 8. Suspect from stage 4 CKD. Anemia panel from last month reviewed.  Adequate iron and folate.  Optimal  vitamin b12 levels   Moderate protein and calorie malnutrition;  Body mass index is 16.99 kg/m. Nutrition consulted.   Inview of recurrent admissions , deconditioning and debility, palliative care consulted for goals of care discussions.    RN Pressure Injury Documentation: Pressure Injury 06/03/21 Sacrum Mid Stage 2 -  Partial thickness loss of dermis presenting as a shallow open injury with a red, pink wound bed without slough. (Active)  06/03/21 1654  Location: Sacrum  Location Orientation: Mid  Staging: Stage 2 -  Partial thickness loss of dermis presenting as a shallow open injury with a red, pink wound bed without slough.  Wound Description (Comments):   Present on Admission: Yes  Will request wound care consult in am.  Interventions: Nepro shake, MVI  Estimated body mass index is 16.99 kg/m as calculated from the following:   Height as of  this encounter: _0  (1.753 m).   Weight as of this encounter: 52.2 kg.  Code Status: full code.  DVT Prophylaxis:  heparin injection 5,000 Units Start: 06/05/21 2200   Level of Care: Level of care: Progressive Family Communication: Updated patient's family at bedside.   Disposition Plan:     Remains inpatient appropriate:  recurrent seizures.    Procedures:  EEG.  MRI brain without contrast.   Consultants:   Neurology.  Palliative. Care   Antimicrobials:   Anti-infectives (From admission, onward)    Start     Dose/Rate Route Frequency Ordered Stop   06/06/21 2200  vancomycin (VANCOREADY) IVPB 500 mg/100 mL        500 mg 100 mL/hr over 60 Minutes Intravenous Every 24 hours 06/05/21 1725     06/05/21 2000  acyclovir (ZOVIRAX) 500 mg in dextrose 5 % 100 mL IVPB        500 mg 110 mL/hr over 60 Minutes Intravenous Every 24 hours 06/05/21 1721     06/05/21 1900  vancomycin (VANCOREADY) IVPB 1250 mg/250 mL        1,250 mg 166.7 mL/hr over 90 Minutes Intravenous  Once 06/05/21 1724 06/05/21 2019   06/05/21 1800  cefTRIAXone (ROCEPHIN) 2 g in sodium chloride 0.9 % 100 mL IVPB        2 g 200 mL/hr over 30 Minutes Intravenous Every 12 hours 06/05/21 1708     06/05/21 1800  ampicillin (OMNIPEN) 2 g in sodium chloride 0.9 % 100 mL IVPB  Status:  Discontinued        2 g 300 mL/hr over 20 Minutes Intravenous Every 6 hours 06/05/21 1714 06/05/21 1714   06/05/21 1800  ampicillin (OMNIPEN) 2 g in sodium chloride 0.9 % 100 mL IVPB        2 g 300 mL/hr over 20 Minutes Intravenous Every 8 hours 06/05/21 1714          Medications  Scheduled Meds:  calcium carbonate  200 mg of elemental calcium Oral BID   chlorhexidine gluconate (MEDLINE KIT)  15 mL Mouth Rinse BID   feeding supplement (NEPRO CARB STEADY)  237 mL Oral BID BM   heparin  5,000 Units Subcutaneous Q8H   LORazepam  2 mg Intravenous Q8H   macitentan  10 mg Oral Daily   mouth rinse  15 mL Mouth Rinse 10 times per day    multivitamin with minerals  1 tablet Oral Daily   nicotine  21 mg Transdermal Daily   phenytoin (DILANTIN) IV  100 mg Intravenous Q8H   rosuvastatin  20 mg Oral Daily  Continuous Infusions:  acyclovir (ZOVIRAX) </= 700 mg IVPB 500 mg (06/05/21 2034)   ampicillin (OMNIPEN) IV 2 g (06/06/21 1309)   brivaracetam (BRIVIACT) IVPB 100 mg (06/06/21 1119)   cefTRIAXone (ROCEPHIN)  IV 2 g (06/06/21 0657)   dextrose 5 % and 0.9% NaCl     lacosamide (VIMPAT) IV     vancomycin      PRN Meds:.diazepam    Subjective:   [name] was seen and examined today.   Pt very lethargic,barely opening eyes to verbal or tactile stimulation.  Family at bedside reports he wakes up and asks for his wife.   Objective:   Vitals:   06/06/21 0200 06/06/21 0300 06/06/21 0750 06/06/21 1206  BP:  110/62 117/69 120/78  Pulse:  76  98  Resp: _0 Temp:  97.7 F (36.5 C) 97.8 F (36.6 C) 98 F (36.7 C)  TempSrc:  Axillary Oral Oral  SpO2:  96% 98%   Weight:      Height:        Intake/Output Summary (Last 24 hours) at 06/06/2021 1459 Last data filed at 06/06/2021 0437 Gross per 24 hour  Intake 250 ml  Output 0 ml  Net 250 ml    Filed Weights   06/03/21 1626  Weight: 52.2 kg     Exam General exam: ill appearing. Somnolent probably from the  meds Respiratory system: Clear to auscultation. Respiratory effort normal. Cardiovascular system: S1 & S2 heard, RRR. No JVD, No pedal edema. Gastrointestinal system: Abdomen is nondistended, soft and nontender. Normal bowel sounds heard. Central nervous system: somnolent, not following commands, able to move all extremities spontaneously.  Extremities:no pedal edema.  Skin: No rashes, lesions or ulcers Psychiatry: unable to assess due to somnolence      Data Reviewed:  I have personally reviewed following labs and imaging studies   CBC Lab Results  Component Value Date   WBC 4.6 06/06/2021   RBC 2.94 (L) 06/06/2021   HGB 8.7 (L)  06/06/2021   HCT 30.5 (L) 06/06/2021   MCV 103.7 (H) 06/06/2021   MCH 29.6 06/06/2021   PLT 210 06/06/2021   MCHC 28.5 (L) 06/06/2021   RDW 18.3 (H) 06/06/2021   LYMPHSABS 0.9 06/06/2021   MONOABS 0.5 06/06/2021   EOSABS 0.1 06/06/2021   BASOSABS 0.0 47/82/9562     Last metabolic panel Lab Results  Component Value Date   NA 144 06/06/2021   K 3.6 06/06/2021   CL 107 06/06/2021   CO2 20 (L) 06/06/2021   BUN 26 (H) 06/06/2021   CREATININE 2.65 (H) 06/06/2021   GLUCOSE 76 06/06/2021   GFRNONAA 26 (L) 06/06/2021   GFRAA 31 (L) 01/02/2020   CALCIUM 7.4 (L) 06/06/2021   PHOS 4.5 06/06/2021   PROT 5.8 (L) 06/04/2021   ALBUMIN 2.2 (L) 06/04/2021   BILITOT 0.5 06/04/2021   ALKPHOS 77 06/04/2021   AST 32 06/04/2021   ALT 21 06/04/2021   ANIONGAP 17 (H) 06/06/2021    CBG (last 3)  Recent Labs    06/06/21 0428 06/06/21 0823 06/06/21 1204  GLUCAP 73 59* 94       Coagulation Profile: No results for input(s): INR, PROTIME in the last 168 hours.   Radiology Studies: MR BRAIN WO CONTRAST  Result Date: 06/04/2021 CLINICAL DATA:  New onset seizure.  Altered mental status. EXAM: MRI HEAD WITHOUT CONTRAST TECHNIQUE: Multiplanar, multiecho pulse sequences of the brain and surrounding structures were obtained without intravenous contrast. COMPARISON:  06/03/2021 MRI and CT. FINDINGS: Brain: Again the study is limited by patient confusion and motion. The study confirms brain swelling and increased T2 signal without definite true restricted diffusion within the mesial right temporal lobe, right hippocampus and right mesial thalamus. Findings are presumed to be postictal rather than subsequent to ischemic infarction. Otherwise, the brain appears normal. No evidence of old infarction, mass, hydrocephalus or extra-axial collection. There may be a developmental venous anomaly in the right basal ganglia and radiating white matter tracts. Vascular: Major vessels at the base of the brain show  flow. Skull and upper cervical spine: Negative Sinuses/Orbits: Clear/negative Other: None IMPRESSION: Limited and motion degraded examination. Redemonstration of increased T2 signal affecting the right mesial temporal lobe, hippocampus and right mesial thalamus, presumed to be postictal change. I do not see low signal on the ADC map to suggest actual brain infarction. Question developmental venous anomaly in the right basal ganglia and radiating white matter tracts. Electronically Signed   By: Nelson Chimes M.D.   On: 06/04/2021 20:30   DG CHEST PORT 1 VIEW  Result Date: 06/06/2021 CLINICAL DATA:  Pleural effusions. EXAM: PORTABLE CHEST 1 VIEW COMPARISON:  06/03/2021 and CT chest 02/20/2021. FINDINGS: Trachea is midline. Heart size stable. Moderate right pleural effusion with bibasilar airspace opacification. Small left pleural effusion. IMPRESSION: Bilateral pleural effusions, right greater than left, with bibasilar atelectasis and/or pneumonia. Electronically Signed   By: Lorin Picket M.D.   On: 06/06/2021 14:08   ECHOCARDIOGRAM COMPLETE  Result Date: 06/06/2021    ECHOCARDIOGRAM REPORT   Patient Name:   [name] Date of Exam: 06/06/2021 Medical Rec #:  [id number]         Height:       69.0 in Accession #:    [id number]        Weight:       115.1 lb Date of Birth:  [birth year]         BSA:          1.633 m Patient Age:    47 years          BP:           110/62 mmHg Patient Gender: M                 HR:           105 bpm. Exam Location:  Inpatient Procedure: 2D Echo, Cardiac Doppler, Color Doppler and Strain Analysis Indications:    CHF  History:        Patient has prior history of Echocardiogram examinations, most                 recent 08/11/2020. CHF, CAD and Previous Myocardial Infarction;                 Risk Factors:Hypertension and Dyslipidemia.  Sonographer:    Luisa Hart RDCS Referring Phys: Theola Sequin  Sonographer Comments: Image acquisition challenging due to patient behavioral  factors. IMPRESSIONS  1. Left ventricular ejection fraction, by estimation, is 40 to 45%. The left ventricle has mildly decreased function. The left ventricle demonstrates global hypokinesis. There is mild left ventricular hypertrophy. Left ventricular diastolic parameters are consistent with Grade II diastolic dysfunction (pseudonormalization). Elevated left atrial pressure.  2. Right ventricular systolic function is mildly reduced. The right ventricular size is normal. There is mildly elevated pulmonary artery systolic pressure. The estimated right ventricular systolic pressure is 49.4 mmHg.  3. A small pericardial effusion is  present.  4. The mitral valve is normal in structure. Mild mitral valve regurgitation.  5. The aortic valve is tricuspid. Aortic valve regurgitation is not visualized. No aortic stenosis is present.  6. The inferior vena cava is normal in size with greater than 50% respiratory variability, suggesting right atrial pressure of 3 mmHg. FINDINGS  Left Ventricle: Left ventricular ejection fraction, by estimation, is 40 to 45%. The left ventricle has mildly decreased function. The left ventricle demonstrates global hypokinesis. The left ventricular internal cavity size was normal in size. There is  mild left ventricular hypertrophy. Left ventricular diastolic parameters are consistent with Grade II diastolic dysfunction (pseudonormalization). Elevated left atrial pressure. Right Ventricle: The right ventricular size is normal. No increase in right ventricular wall thickness. Right ventricular systolic function is mildly reduced. There is mildly elevated pulmonary artery systolic pressure. The tricuspid regurgitant velocity  is 2.85 m/s, and with an assumed right atrial pressure of 3 mmHg, the estimated right ventricular systolic pressure is 56.7 mmHg. Left Atrium: Left atrial size was normal in size. Right Atrium: Right atrial size was normal in size. Pericardium: A small pericardial effusion is  present. Mitral Valve: The mitral valve is normal in structure. Mild mitral valve regurgitation. Tricuspid Valve: The tricuspid valve is normal in structure. Tricuspid valve regurgitation is trivial. Aortic Valve: The aortic valve is tricuspid. Aortic valve regurgitation is not visualized. No aortic stenosis is present. Aortic valve mean gradient measures 4.0 mmHg. Aortic valve peak gradient measures 8.3 mmHg. Aortic valve area, by VTI measures 2.89 cm. Pulmonic Valve: The pulmonic valve was not well visualized. Pulmonic valve regurgitation is trivial. Aorta: The aortic root is normal in size and structure. Venous: The inferior vena cava is normal in size with greater than 50% respiratory variability, suggesting right atrial pressure of 3 mmHg. IAS/Shunts: The interatrial septum was not well visualized.  LEFT VENTRICLE PLAX 2D LVIDd:         4.50 cm     Diastology LVIDs:         4.00 cm     LV e' medial:    7.07 cm/s LV PW:         1.10 cm     LV E/e' medial:  11.4 LV IVS:        1.00 cm     LV e' lateral:   4.24 cm/s LVOT diam:     2.00 cm     LV E/e' lateral: 19.1 LV SV:         61 LV SV Index:   37 LVOT Area:     3.14 cm  LV Volumes (MOD) LV vol d, MOD A2C: 60.3 ml LV vol d, MOD A4C: 55.3 ml LV vol s, MOD A2C: 50.0 ml LV vol s, MOD A4C: 36.8 ml LV SV MOD A2C:     10.3 ml LV SV MOD A4C:     55.3 ml LV SV MOD BP:      16.1 ml RIGHT VENTRICLE RV Basal diam:  2.30 cm RV Mid diam:    1.40 cm RV S prime:     8.59 cm/s TAPSE (M-mode): 1.2 cm LEFT ATRIUM             Index        RIGHT ATRIUM           Index LA diam:        2.30 cm 1.41 cm/m   RA Area:     13.70 cm LA Vol (A2C):  47.2 ml 28.90 ml/m  RA Volume:   37.70 ml  23.08 ml/m LA Vol (A4C):   31.8 ml 19.47 ml/m LA Biplane Vol: 40.3 ml 24.67 ml/m  AORTIC VALVE                    PULMONIC VALVE AV Area (Vmax):    2.75 cm     PV Vmax:          1.06 m/s AV Area (Vmean):   2.67 cm     PV Vmean:         72.100 cm/s AV Area (VTI):     2.89 cm     PV VTI:            0.175 m AV Vmax:           144.00 cm/s  PV Peak grad:     4.5 mmHg AV Vmean:          96.500 cm/s  PV Mean grad:     2.5 mmHg AV VTI:            0.211 m      PR End Diast Vel: 10.24 msec AV Peak Grad:      8.3 mmHg AV Mean Grad:      4.0 mmHg LVOT Vmax:         126.00 cm/s LVOT Vmean:        82.000 cm/s LVOT VTI:          0.194 m LVOT/AV VTI ratio: 0.92  AORTA Ao Root diam: 2.00 cm Ao Asc diam:  3.20 cm MITRAL VALVE                TRICUSPID VALVE MV Area (PHT): 9.85 cm     TR Peak grad:   32.5 mmHg MV Decel Time: 77 msec      TR Vmax:        285.00 cm/s MR Peak grad: 89.5 mmHg MR Mean grad: 55.0 mmHg     SHUNTS MR Vmax:      473.00 cm/s   Systemic VTI:  0.19 m MR Vmean:     345.0 cm/s    Systemic Diam: 2.00 cm MV E velocity: 80.80 cm/s MV A velocity: 104.00 cm/s MV E/A ratio:  0.78 Oswaldo Milian MD Electronically signed by Oswaldo Milian MD Signature Date/Time: 06/06/2021/1:13:58 PM    Final        Hosie Poisson M.D. [facility] 06/06/2021, 2:59 PM  Available via Epic secure chat 7am-7pm After 7 pm, please refer to night coverage provider listed on amion.

## 2021-06-07 ENCOUNTER — Telehealth: Payer: Self-pay | Admitting: *Deleted

## 2021-06-07 ENCOUNTER — Inpatient Hospital Stay (HOSPITAL_COMMUNITY): Admission: RE | Admit: 2021-06-07 | Payer: 59 | Source: Ambulatory Visit

## 2021-06-07 DIAGNOSIS — I272 Pulmonary hypertension, unspecified: Secondary | ICD-10-CM | POA: Diagnosis not present

## 2021-06-07 DIAGNOSIS — N184 Chronic kidney disease, stage 4 (severe): Secondary | ICD-10-CM | POA: Diagnosis not present

## 2021-06-07 DIAGNOSIS — R569 Unspecified convulsions: Secondary | ICD-10-CM | POA: Diagnosis not present

## 2021-06-07 LAB — GLUCOSE, CAPILLARY
Glucose-Capillary: 116 mg/dL — ABNORMAL HIGH (ref 70–99)
Glucose-Capillary: 118 mg/dL — ABNORMAL HIGH (ref 70–99)
Glucose-Capillary: 121 mg/dL — ABNORMAL HIGH (ref 70–99)
Glucose-Capillary: 150 mg/dL — ABNORMAL HIGH (ref 70–99)
Glucose-Capillary: 152 mg/dL — ABNORMAL HIGH (ref 70–99)
Glucose-Capillary: 159 mg/dL — ABNORMAL HIGH (ref 70–99)

## 2021-06-07 LAB — MISC LABCORP TEST (SEND OUT)
Labcorp test code: 9985
Labcorp test code: 505625

## 2021-06-07 LAB — PATHOLOGIST SMEAR REVIEW

## 2021-06-07 LAB — ALBUMIN: Albumin: 2 g/dL — ABNORMAL LOW (ref 3.5–5.0)

## 2021-06-07 LAB — PHOSPHORUS
Phosphorus: 3.4 mg/dL (ref 2.5–4.6)
Phosphorus: 3.1 mg/dL (ref 2.5–4.6)

## 2021-06-07 LAB — MAGNESIUM
Magnesium: 1.6 mg/dL — ABNORMAL LOW (ref 1.7–2.4)
Magnesium: 3.3 mg/dL — ABNORMAL HIGH (ref 1.7–2.4)

## 2021-06-07 LAB — PHENYTOIN LEVEL, TOTAL: Phenytoin Lvl: 19.2 ug/mL (ref 10.0–20.0)

## 2021-06-07 LAB — VDRL, CSF: VDRL Quant, CSF: NONREACTIVE

## 2021-06-07 MED ORDER — THIAMINE HCL 100 MG PO TABS
100.0000 mg | ORAL_TABLET | Freq: Every day | ORAL | Status: DC
  Administered 2021-06-08 – 2021-06-12 (×5): 100 mg
  Filled 2021-06-07 (×6): qty 1

## 2021-06-07 MED ORDER — PHENYTOIN SODIUM 50 MG/ML IJ SOLN
75.0000 mg | Freq: Three times a day (TID) | INTRAMUSCULAR | Status: DC
  Administered 2021-06-07 – 2021-06-09 (×6): 75 mg via INTRAVENOUS
  Filled 2021-06-07 (×6): qty 2

## 2021-06-07 MED ORDER — ALPRAZOLAM 0.25 MG PO TABS
0.2500 mg | ORAL_TABLET | Freq: Two times a day (BID) | ORAL | Status: DC | PRN
  Administered 2021-06-10 (×2): 0.25 mg
  Filled 2021-06-07 (×2): qty 1

## 2021-06-07 MED ORDER — PANTOPRAZOLE SODIUM 40 MG PO TBEC: 40.0000 mg | DELAYED_RELEASE_TABLET | Freq: Every day | ORAL | Status: DC

## 2021-06-07 MED ORDER — TORSEMIDE 20 MG PO TABS
20.0000 mg | ORAL_TABLET | Freq: Every day | ORAL | Status: DC
Start: 2021-06-07 — End: 2021-06-07

## 2021-06-07 MED ORDER — FREE WATER
100.0000 mL | Status: DC
  Administered 2021-06-07 – 2021-06-08 (×8): 100 mL

## 2021-06-07 MED ORDER — SILDENAFIL CITRATE 20 MG PO TABS
40.0000 mg | ORAL_TABLET | Freq: Three times a day (TID) | ORAL | Status: DC
  Administered 2021-06-07 – 2021-06-12 (×15): 40 mg
  Filled 2021-06-07 (×19): qty 2

## 2021-06-07 MED ORDER — PANTOPRAZOLE 2 MG/ML SUSPENSION
40.0000 mg | Freq: Every day | ORAL | Status: DC
  Administered 2021-06-07 – 2021-06-12 (×6): 40 mg
  Filled 2021-06-07 (×6): qty 20

## 2021-06-07 MED ORDER — OSMOLITE 1.2 CAL PO LIQD
1000.0000 mL | ORAL | Status: DC
  Administered 2021-06-07: 1000 mL

## 2021-06-07 MED ORDER — SILDENAFIL CITRATE 20 MG PO TABS
40.0000 mg | ORAL_TABLET | Freq: Three times a day (TID) | ORAL | Status: DC
Start: 2021-06-07 — End: 2021-06-07

## 2021-06-07 MED ORDER — ALPRAZOLAM 0.25 MG PO TABS: 0.2500 mg | ORAL_TABLET | Freq: Two times a day (BID) | ORAL | Status: DC | PRN

## 2021-06-07 MED ORDER — DEXTROSE 5 % IV SOLN
500.0000 mg | INTRAVENOUS | Status: DC
  Administered 2021-06-07 – 2021-06-08 (×2): 500 mg via INTRAVENOUS
  Filled 2021-06-07 (×3): qty 10

## 2021-06-07 MED ORDER — TORSEMIDE 20 MG PO TABS
20.0000 mg | ORAL_TABLET | Freq: Every day | ORAL | Status: DC
  Administered 2021-06-08: 20 mg
  Filled 2021-06-07: qty 1

## 2021-06-07 MED ORDER — ROSUVASTATIN CALCIUM 20 MG PO TABS
20.0000 mg | ORAL_TABLET | Freq: Every day | ORAL | Status: DC
  Administered 2021-06-08 – 2021-06-12 (×5): 20 mg
  Filled 2021-06-07 (×5): qty 1

## 2021-06-07 MED ORDER — PROSOURCE TF PO LIQD
45.0000 mL | Freq: Every day | ORAL | Status: DC
  Administered 2021-06-07 – 2021-06-12 (×6): 45 mL
  Filled 2021-06-07 (×6): qty 45

## 2021-06-07 MED ORDER — ADULT MULTIVITAMIN W/MINERALS CH
1.0000 | ORAL_TABLET | Freq: Every day | ORAL | Status: DC
  Administered 2021-06-08 – 2021-06-12 (×5): 1
  Filled 2021-06-07 (×5): qty 1

## 2021-06-07 MED ORDER — MAGNESIUM SULFATE 4 GM/100ML IV SOLN
4.0000 g | Freq: Once | INTRAVENOUS | Status: AC
  Administered 2021-06-07: 4 g via INTRAVENOUS
  Filled 2021-06-07: qty 100

## 2021-06-07 NOTE — Progress Notes (Signed)
LTM maint complete - no skin breakdown.  Serviced Cz Atrium monitored.

## 2021-06-07 NOTE — Progress Notes (Addendum)
Initial Nutrition Assessment  DOCUMENTATION CODES:   Severe malnutrition in context of chronic illness, Underweight  INTERVENTION:   Continue to monitor magnesium, potassium, and phosphorus BID for at least 3 days, MD to replete as needed, as pt is at risk for refeeding syndrome given severe malnutrition.  Recommend addition of thiamine given malnutrition and concern for refeeding syndrome -- discussed with MD  Continue TF via Cortrak: -Osmolite 1.2 @ 77m/hr (15639mday) -4590mrosource TF daily -100m75mee water Q4H  Provides 1912 kcals, 97 grams protein, 1279ml71me water  Total free water: 1879ml 48mTRITION DIAGNOSIS:   Severe Malnutrition related to chronic illness (CHF, CKD) as evidenced by severe fat depletion, severe muscle depletion.  ongoing  GOAL:   Patient will meet greater than or equal to 90% of their needs  Addressing with TF  MONITOR:   Diet advancement, PO intake, Supplement acceptance, Labs, Weight trends, Skin, I & O's  REASON FOR ASSESSMENT:   Consult Assessment of nutrition requirement/status  ASSESSMENT:   61 yo 55le with a PMH of CAD, CHF, hypertension, CKD stage IV, Raynard's, GI bleed, and recent history of seizures who presents with recurrent seizures.  Pt continues to be extremely lethargic and unsafe for POs per SLP. Pt remains NPO and had Cortrak placed yesterday (gastric tip confirmed via xray) with the initiation of TF protocol (Osmolite 1.2 @ 20ml/h73mtrated to 50ml/hr28mer RN, pt tolerating TF well at this time, currently running at 40ml/hr 59m plans to advance to goal shortly. Will adjust regimen to better meet pt's needs.   Medications: tums, mvi with minerals Labs: Recent Labs  Lab 06/04/21 0903 06/05/21 0209 06/06/21 0233 06/06/21 1803 06/07/21 0444  NA 140 143 144  --   --   K 3.5 3.6 3.6  --   --   CL 103 106 107  --   --   CO2 22 24 20*  --   --   BUN 24* 25* 26*  --   --   CREATININE 2.71* 2.77* 2.65*  --   --    CALCIUM 7.6* 7.7* 7.4*  --   --   MG 1.6* 1.8 1.7 1.6* 1.6*  PHOS 4.2 4.2 4.5 3.6 3.4  GLUCOSE 57* 89 76  --   --   CBGs: [id number]   Diet Order:   Diet Order             Diet NPO time specified  Diet effective now                   EDUCATION NEEDS:   Education needs have been addressed  Skin:  Skin Assessment: Skin Integrity Issues: Skin Integrity Issues:: Stage II Stage II: Mid sacrum  Last BM:  2/11  Height:   Ht Readings from Last 1 Encounters:  06/03/21 _0  (1.753 m)    Weight:   Wt Readings from Last 1 Encounters:  06/03/21 52.2 kg     BMI:  Body mass index is 16.99 kg/m.  Estimated Nutritional Needs:   Kcal:  1800-2000  Protein:  90-105 grams  Fluid:  >1.8 L     Wylee Ogden A.Theone Stanley, LDN (she/her/hers) RD pager number and weekend/on-call pager number located in [email or web address]

## 2021-06-07 NOTE — Plan of Care (Signed)
Pt is alert to responsive to voice, more responsive to significant other present at bedside. IV medications given per order. Tube feedings currently at 65ms per hour.  Problem: Education: Goal: Knowledge of General Education information will improve Description: Including pain rating scale, medication(s)/side effects and non-pharmacologic comfort measures Outcome: Progressing   Problem: Health Behavior/Discharge Planning: Goal: Ability to manage health-related needs will improve Outcome: Progressing   Problem: Clinical Measurements: Goal: Ability to maintain clinical measurements within normal limits will improve Outcome: Progressing Goal: Will remain free from infection Outcome: Progressing Goal: Diagnostic test results will improve Outcome: Progressing Goal: Respiratory complications will improve Outcome: Progressing Goal: Cardiovascular complication will be avoided Outcome: Progressing   Problem: Activity: Goal: Risk for activity intolerance will decrease Outcome: Progressing   Problem: Nutrition: Goal: Adequate nutrition will be maintained Outcome: Progressing   Problem: Coping: Goal: Level of anxiety will decrease Outcome: Progressing   Problem: Elimination: Goal: Will not experience complications related to bowel motility Outcome: Progressing Goal: Will not experience complications related to urinary retention Outcome: Progressing   Problem: Pain Managment: Goal: General experience of comfort will improve Outcome: Progressing   Problem: Safety: Goal: Ability to remain free from injury will improve Outcome: Progressing   Problem: Skin Integrity: Goal: Risk for impaired skin integrity will decrease Outcome: Progressing

## 2021-06-07 NOTE — Progress Notes (Addendum)
Phenytoin Follow-Up Consult Indication: new onset refractory seizures  Allergies  Allergen Reactions   Oxycodone Other (See Comments)    Hallucinations    Patient Measurements: Height: 5' 9" (175.3 cm) Weight: 52.2 kg (115 lb 1.3 oz) IBW/kg (Calculated) : 70.7 TPN AdjBW (KG): 52.2 Body mass index is 16.99 kg/m.   Vital signs: Temp: 97.8 F (36.6 C) (02/14 1204) Temp Source: Oral (02/14 1204) BP: 138/89 (02/14 1204) Pulse Rate: 91 (02/14 1204)  Labs: Lab Results  Component Value Date/Time   Albumin 2.0 (L) 06/07/2021 0444   Phenytoin Lvl 19.2 06/07/2021 0444   Lab Results  Component Value Date   PHENYTOIN 19.2 06/07/2021   Estimated Creatinine Clearance: 21.3 mL/min (A) (by C-G formula based on SCr of 2.65 mg/dL (H)).    Assessment: Corrected phenytoin level (if needed): 38.4 (PHT level 19.2, ALB 2.0) Significant potential drug interactions:  - Induction of CYP3A4 by phenytoin may increase the metabolic elimination of Macitentan - Brivaracetam may increase the serum concentration of Phenytoin  Phenytoin concentrations were reportedly an average of 20% higher with concurrent use of phenytoin and brivaracetam (400 mg/day).   New seizure activity on 2/04. 2 weeks prior was admitted/treated for C diff and metabolic acidosis. He had a negative CT head and lab work notable for corrected calcium of 7.1. Was discharged with calcium supplement and neurology follow up. Has since had additional seizure type activity, saw outpatient neurology 2/07, prescribed Briviact, not yet started d/t unavailability. On 2/10, had 1 hr EEG and following this had a tonic clonic seizure, EMS called and he had another on the way to hospital.    2/10:  - Keppra 2 grams IV x 1 - coCa 7.9 >>calgluc 2 g IV x 1 - Mag 1 >> Mag IV 2 g x 1 2/11:  - Fosphenytoin LD 1044 mg x 1 - Phenytoin 100 mg every 8 hours - Briviact 50 mg IV x 1 2/12:  - Briviact 100 mg IV every 12 hours - Ativan 2 mg IV every  8 hours 2/13:  - Calcium 7.4 (coCa 8.8) - Vimpat 50 mg IV every 12 hours    Goals of care:  Total phenytoin level: 10-20 mcg/ml Free phenytoin level: 1-2 mcg/ml   Plan:  Decrease phenytoin to 75 mg IV Q8H Recheck total phenytoin level once at steady state Pharmacy will continue to follow regarding obtaining total phenytoin levels and dose adjustments as indicated.   Thank you for allowing pharmacy to be a part of this patients care.  Ardyth Harps, PharmD Clinical Pharmacist

## 2021-06-07 NOTE — Progress Notes (Signed)
SLP Cancellation Note  Patient Details Name: EGYPT MARCHIANO MRN: [id number] DOB: 02-12-1961   Cancelled treatment:       Reason Eval/Treat Not Completed: Patient's level of consciousness. Pt still not appropriately alert for POs. Continue Cortrak and SLP will f/u for improved arousal tomorrow.    Diaz Crago, Katherene Ponto 06/07/2021, 9:47 AM

## 2021-06-07 NOTE — Progress Notes (Addendum)
Subjective: Interval History:   06/07/21: Patient appears still sedated today. Wife and family at the bedside. Discussed CSF cultures are negative and have stopped the antibiotics. PCR is still pending and antiviral acyclovir will continue for now.   Objective: Vital signs in last 24 hours: Temp:  [97.4 F (36.3 C)-98 F (36.7 C)] 97.6 F (36.4 C) (02/14 0842) Pulse Rate:  [65-98] 65 (02/14 0842) Resp:  [16-20] 16 (02/14 0842) BP: (119-158)/(68-93) 128/68 (02/14 0842) SpO2:  [8 %-100 %] 8 % (02/14 0842)  Intake/Output from previous day: 02/13 0701 - 02/14 0700 In: 1580.9 [I.V.:331.9; NG/GT:234; IV Piggyback:1015] Out: 100 [Emesis/NG output:100] Intake/Output this shift: No intake/output data recorded. Nutritional status:  Diet Order             Diet NPO time specified  Diet effective now                   General appearance: no distress.  Laying in bed resting but will respond to conversation.   Respiratory: Nonlabored.  Neuro: He appears to be sedated. He did raise his turn to look at me and then wife.  Otherwise keeps his eyes shut.  Pupils equal round reactive. Extremities: Withdraws to pain.   Lab Results:  Recent Labs    06/05/21 0209 06/06/21 0233  WBC 5.0 4.6  HGB 7.3* 8.7*  HCT 24.9* 30.5*  PLT 180 210  NA 143 144  K 3.6 3.6  CL 106 107  CO2 24 20*  GLUCOSE 89 76  BUN 25* 26*  CREATININE 2.77* 2.65*  CALCIUM 7.7* 7.4*   Lipid Panel No results for input(s): CHOL, TRIG, HDL, CHOLHDL, VLDL, LDLCALC in the last 72 hours.  Studies/Results: DG CHEST PORT 1 VIEW  Result Date: 06/06/2021 CLINICAL DATA:  Pleural effusions. EXAM: PORTABLE CHEST 1 VIEW COMPARISON:  06/03/2021 and CT chest 02/20/2021. FINDINGS: Trachea is midline. Heart size stable. Moderate right pleural effusion with bibasilar airspace opacification. Small left pleural effusion. IMPRESSION: Bilateral pleural effusions, right greater than left, with bibasilar atelectasis and/or  pneumonia. Electronically Signed   By: Lorin Picket M.D.   On: 06/06/2021 14:08   DG Abd Portable 1V  Result Date: 06/06/2021 CLINICAL DATA:  Feeding tube placement EXAM: PORTABLE ABDOMEN - 1 VIEW COMPARISON:  10/05/2019 FINDINGS: Enteric tube tip overlies the stomach. Pleural effusions and basilar airspace disease. Cardiomegaly. IMPRESSION: Enteric tube tip overlies the stomach. Electronically Signed   By: Donavan Foil M.D.   On: 06/06/2021 16:08   ECHOCARDIOGRAM COMPLETE  Result Date: 06/06/2021    ECHOCARDIOGRAM REPORT   Patient Name:   KIWAN GADSDEN Date of Exam: 06/06/2021 Medical Rec #:  [id number]         Height:       69.0 in Accession #:    [id number]        Weight:       115.1 lb Date of Birth:  11/08/1960         BSA:          1.633 m Patient Age:    61 years          BP:           110/62 mmHg Patient Gender: M                 HR:           105 bpm. Exam Location:  Inpatient Procedure: 2D Echo, Cardiac Doppler, Color Doppler and Strain Analysis Indications:  CHF  History:        Patient has prior history of Echocardiogram examinations, most                 recent 08/11/2020. CHF, CAD and Previous Myocardial Infarction;                 Risk Factors:Hypertension and Dyslipidemia.  Sonographer:    Luisa Hart RDCS Referring Phys: Theola Sequin  Sonographer Comments: Image acquisition challenging due to patient behavioral factors. IMPRESSIONS  1. Left ventricular ejection fraction, by estimation, is 40 to 45%. The left ventricle has mildly decreased function. The left ventricle demonstrates global hypokinesis. There is mild left ventricular hypertrophy. Left ventricular diastolic parameters are consistent with Grade II diastolic dysfunction (pseudonormalization). Elevated left atrial pressure.  2. Right ventricular systolic function is mildly reduced. The right ventricular size is normal. There is mildly elevated pulmonary artery systolic pressure. The estimated right ventricular systolic  pressure is 90.0 mmHg.  3. A small pericardial effusion is present.  4. The mitral valve is normal in structure. Mild mitral valve regurgitation.  5. The aortic valve is tricuspid. Aortic valve regurgitation is not visualized. No aortic stenosis is present.  6. The inferior vena cava is normal in size with greater than 50% respiratory variability, suggesting right atrial pressure of 3 mmHg. FINDINGS  Left Ventricle: Left ventricular ejection fraction, by estimation, is 40 to 45%. The left ventricle has mildly decreased function. The left ventricle demonstrates global hypokinesis. The left ventricular internal cavity size was normal in size. There is  mild left ventricular hypertrophy. Left ventricular diastolic parameters are consistent with Grade II diastolic dysfunction (pseudonormalization). Elevated left atrial pressure. Right Ventricle: The right ventricular size is normal. No increase in right ventricular wall thickness. Right ventricular systolic function is mildly reduced. There is mildly elevated pulmonary artery systolic pressure. The tricuspid regurgitant velocity  is 2.85 m/s, and with an assumed right atrial pressure of 3 mmHg, the estimated right ventricular systolic pressure is 94.4 mmHg. Left Atrium: Left atrial size was normal in size. Right Atrium: Right atrial size was normal in size. Pericardium: A small pericardial effusion is present. Mitral Valve: The mitral valve is normal in structure. Mild mitral valve regurgitation. Tricuspid Valve: The tricuspid valve is normal in structure. Tricuspid valve regurgitation is trivial. Aortic Valve: The aortic valve is tricuspid. Aortic valve regurgitation is not visualized. No aortic stenosis is present. Aortic valve mean gradient measures 4.0 mmHg. Aortic valve peak gradient measures 8.3 mmHg. Aortic valve area, by VTI measures 2.89 cm. Pulmonic Valve: The pulmonic valve was not well visualized. Pulmonic valve regurgitation is trivial. Aorta: The aortic  root is normal in size and structure. Venous: The inferior vena cava is normal in size with greater than 50% respiratory variability, suggesting right atrial pressure of 3 mmHg. IAS/Shunts: The interatrial septum was not well visualized.  LEFT VENTRICLE PLAX 2D LVIDd:         4.50 cm     Diastology LVIDs:         4.00 cm     LV e' medial:    7.07 cm/s LV PW:         1.10 cm     LV E/e' medial:  11.4 LV IVS:        1.00 cm     LV e' lateral:   4.24 cm/s LVOT diam:     2.00 cm     LV E/e' lateral: 19.1 LV SV:  61 LV SV Index:   37 LVOT Area:     3.14 cm  LV Volumes (MOD) LV vol d, MOD A2C: 60.3 ml LV vol d, MOD A4C: 55.3 ml LV vol s, MOD A2C: 50.0 ml LV vol s, MOD A4C: 36.8 ml LV SV MOD A2C:     10.3 ml LV SV MOD A4C:     55.3 ml LV SV MOD BP:      16.1 ml RIGHT VENTRICLE RV Basal diam:  2.30 cm RV Mid diam:    1.40 cm RV S prime:     8.59 cm/s TAPSE (M-mode): 1.2 cm LEFT ATRIUM             Index        RIGHT ATRIUM           Index LA diam:        2.30 cm 1.41 cm/m   RA Area:     13.70 cm LA Vol (A2C):   47.2 ml 28.90 ml/m  RA Volume:   37.70 ml  23.08 ml/m LA Vol (A4C):   31.8 ml 19.47 ml/m LA Biplane Vol: 40.3 ml 24.67 ml/m  AORTIC VALVE                    PULMONIC VALVE AV Area (Vmax):    2.75 cm     PV Vmax:          1.06 m/s AV Area (Vmean):   2.67 cm     PV Vmean:         72.100 cm/s AV Area (VTI):     2.89 cm     PV VTI:           0.175 m AV Vmax:           144.00 cm/s  PV Peak grad:     4.5 mmHg AV Vmean:          96.500 cm/s  PV Mean grad:     2.5 mmHg AV VTI:            0.211 m      PR End Diast Vel: 10.24 msec AV Peak Grad:      8.3 mmHg AV Mean Grad:      4.0 mmHg LVOT Vmax:         126.00 cm/s LVOT Vmean:        82.000 cm/s LVOT VTI:          0.194 m LVOT/AV VTI ratio: 0.92  AORTA Ao Root diam: 2.00 cm Ao Asc diam:  3.20 cm MITRAL VALVE                TRICUSPID VALVE MV Area (PHT): 9.85 cm     TR Peak grad:   32.5 mmHg MV Decel Time: 77 msec      TR Vmax:        285.00 cm/s MR Peak grad:  89.5 mmHg MR Mean grad: 55.0 mmHg     SHUNTS MR Vmax:      473.00 cm/s   Systemic VTI:  0.19 m MR Vmean:     345.0 cm/s    Systemic Diam: 2.00 cm MV E velocity: 80.80 cm/s MV A velocity: 104.00 cm/s MV E/A ratio:  0.78 Oswaldo Milian MD Electronically signed by Oswaldo Milian MD Signature Date/Time: 06/06/2021/1:13:58 PM    Final     Medications: I have reviewed the patient's current medications. Scheduled:  calcium carbonate  200 mg of  elemental calcium Oral BID   chlorhexidine gluconate (MEDLINE KIT)  15 mL Mouth Rinse BID   heparin  5,000 Units Subcutaneous Q8H   LORazepam  2 mg Intravenous Q8H   macitentan  10 mg Oral Daily   mouth rinse  15 mL Mouth Rinse 10 times per day   multivitamin with minerals  1 tablet Oral Daily   nicotine  21 mg Transdermal Daily   phenytoin (DILANTIN) IV  100 mg Intravenous Q8H   rosuvastatin  20 mg Oral Daily   Continuous:  brivaracetam (BRIVIACT) IVPB 100 mg (06/06/21 2337)   dextrose 5 % and 0.9% NaCl 40 mL/hr at 06/06/21 1748   feeding supplement (OSMOLITE 1.2 CAL) 40 mL/hr at 06/07/21 0300   lacosamide (VIMPAT) IV 50 mg (06/06/21 2339)   IYM:EBRAXENM  Assessment/Plan: MRI brain 2/11: - Limited and motion degraded examination. Redemonstration of increased T2 signal affecting the right mesial temporal lobe, hippocampus and right mesial thalamus, presumed to be postictal change. I do not see low signal on the ADC map to suggest actual brain infarction. - Question developmental venous anomaly in the right basal ganglia and radiating white matter tracts.   Overnight EEG  2/11 - 2/12:"This was an abnormal continuous video EEG due to right frontal LPD-plus activity without any further ictal evolution."  2/12 - 2/13:This was an abnormal continuous video EEG due to right frontal LPDs with less overriding fast activity in the evening. No ictal evolution was seen.   2/13 -2/14:  improved right frontal LPDs. No seizures were seen. Morphology of  the discharges was improved with increased medication treatment.    Assessment: 61 y.o. male who presented to the ED for evaluation of multiple seizures at home with concern for LPDs on outpatient EEG. Recently discharged from the ED on 2/4 after his first seizure with outpatient neurology follow up. Initial ED work up revealed a magnesium of 1.0 and ionized calcium of 0.86.   He had LP completed cultures were negative and ABX discontinued. Currently on antiviral pending PCR.  Overnight EEG showed improved right frontal LPDs. No seizures were seen. Morphology of the discharges was improved with increased medication treatment.    Recommendations: - Continue LTM EEG-still showing LPD's overnight. - Continue seizure precautions - Continue Briviact 123m BID and phenytoin 100 mg q8h IV --Add Vimpat 50 mg IV twice daily.  Keep at a low dose due to creatinine. -Check levels. - 238mIV Ativan PRN for seizure activity lasting > 5 minutes and notify neurology - Discontinue all ABX as CSF cultures negative - Continue Antiviral pending PCR - Delirium precautions - follow BNP - 1173.8 on 06/03/21 - Neurology will continue to follow  Discussed plan with patient's wife who is a caretaker.   LOS: 4 days   JESSICA L.Evalina FieldNeuro NP Can be reached via Epic secure message 06/07/21 9:30 AM  ATTENDING ATTESTATION:  wife concerned about #meds and confusion. She was updated on need for seizure medications.   Dr. PaReeves Forthvaluated pt independently, reviewed imaging, chart, labs. Discussed and formulated plan with the APP. Please see APP note above for details.   Total 36 minutes spent on counseling patient and coordinating care, writing notes and reviewing chart.  Winifred Bodiford,MD

## 2021-06-07 NOTE — Progress Notes (Signed)
EEG maintenance was performed. No skin break down.

## 2021-06-07 NOTE — Progress Notes (Addendum)
Physical Therapy Treatment Patient Details Name: [name] MRN: [id number] DOB: 31-Oct-1960 Today's Date: 06/07/2021   History of Present Illness Pt is a 61 y/o male who presents with recurrent seizures. PMH significant for CAD, CHF, HTN, CKD IV, Raynard's, GIB, recent history of seizures, hypothyroidism.    PT Comments    Pt initially restless verging on agitated, very limited in tolerance for activity. Pt allowed PT and SPT to reposition him in bed, pt at aspiration risk given low position in bed and tube feeding running. PT educated pt's family on repositioning and safe PROM technique to promote circulation and skin integrity, rolling every 2 hours and propped with pillows recommended given sacral wound. PT to continue to follow.     Recommendations for follow up therapy are one component of a multi-disciplinary discharge planning process, led by the attending physician.  Recommendations may be updated based on patient status, additional functional criteria and insurance authorization.  Follow Up Recommendations  Home health PT (family reports "we are taking him home")     Assistance Recommended at Discharge Frequent or constant Supervision/Assistance  Patient can return home with the following Two people to help with walking and/or transfers;Assist for transportation;Help with stairs or ramp for entrance   Equipment Recommendations  Hospital bed    Recommendations for Other Services Rehab consult     Precautions / Restrictions Precautions Precautions: Fall Precaution Comments: Currently on continuous EEG; periods of agitation Restrictions Weight Bearing Restrictions: No     Mobility  Bed Mobility Overal bed mobility: Needs Assistance             General bed mobility comments: boost up in bed with total +2, tolerated repositioning only    Transfers                   General transfer comment: NT - pt lethargic and irritable/agitated when pushed     Ambulation/Gait                   Stairs             Wheelchair Mobility    Modified Rankin (Stroke Patients Only)       Balance                                            Cognition Arousal/Alertness: Awake/alert, Lethargic Behavior During Therapy: Restless Overall Cognitive Status: Impaired/Different from baseline Area of Impairment: Orientation, Attention, Following commands, Memory, Safety/judgement, Awareness, Problem solving                 Orientation Level: Disoriented to, Place, Person, Time, Situation Current Attention Level: Focused Memory: Decreased short-term memory Following Commands: Follows one step commands inconsistently, Follows one step commands with increased time Safety/Judgement: Decreased awareness of safety, Decreased awareness of deficits Awareness: Intellectual Problem Solving: Slow processing, Decreased initiation, Difficulty sequencing, Requires verbal cues, Requires tactile cues General Comments: pt resistant to PT touch, is restless and could verge on agitated if pushed. Per pt's wife, pt on x2 seizure medications and x1 sedating medication        Exercises General Exercises - Lower Extremity Heel Slides: PROM, Both, 5 reps, Supine Shoulder Exercises Shoulder Flexion: PROM, Both, 5 reps, Supine    General Comments        Pertinent Vitals/Pain Pain Assessment Pain Assessment: Faces Faces Pain Scale: No hurt  Home Living                          Prior Function            PT Goals (current goals can now be found in the care plan section) Acute Rehab PT Goals Patient Stated Goal: None stated by patient PT Goal Formulation: With patient/family Time For Goal Achievement: 06/19/21 Potential to Achieve Goals: Good Progress towards PT goals: Progressing toward goals    Frequency    Min 3X/week      PT Plan Current plan remains appropriate    Co-evaluation               AM-PAC PT "6 Clicks" Mobility   Outcome Measure  Help needed turning from your back to your side while in a flat bed without using bedrails?: A Lot Help needed moving from lying on your back to sitting on the side of a flat bed without using bedrails?: Total Help needed moving to and from a bed to a chair (including a wheelchair)?: Total Help needed standing up from a chair using your arms (e.g., wheelchair or bedside chair)?: Total Help needed to walk in hospital room?: Total Help needed climbing 3-5 steps with a railing? : Total 6 Click Score: 7    End of Session Equipment Utilized During Treatment: Gait belt;Oxygen Activity Tolerance: Patient tolerated treatment well Patient left: in bed;with call bell/phone within reach Nurse Communication: Mobility status PT Visit Diagnosis: Other symptoms and signs involving the nervous system (Y22.336)     Time: 1224-4975 PT Time Calculation (min) (ACUTE ONLY): 18 min  Charges:  $Therapeutic Activity: 8-22 mins                     Stacie Glaze, PT DPT Acute Rehabilitation Services Pager [phone]  Office [phone]    Roxine Caddy E Ruffin Pyo 06/07/2021, 1:36 PM

## 2021-06-07 NOTE — Consult Note (Signed)
WOC Nurse Consult Note: Patient receiving care in [facility] 3W07 Severe malnutrition with consult to nutrition management for tube feeds.  Reason for Consult: Stage 2 sacral wound Wound type: Unstageable coccyx wound Pressure Injury POA: Yes Measurement: approx 1.5 x 1.5 x 0.1 Wound bed: yellow Drainage (amount, consistency, odor) None Periwound: Intact Dressing procedure/placement/frequency: Spouse at bedside, states she has been putting salve on this wound since he left the hospital with last admission and it has improved. We will continue with this treatment with spouse applying daily and change patient over to a standard size air mattress.   Monitor the wound area(s) for worsening of condition such as: Signs/symptoms of infection, increase in size, development of or worsening of odor, development of pain, or increased pain at the affected locations.   Notify the medical team if any of these develop.  Thank you for the consult. [place] nurse will not follow at this time.   Please re-consult the [facility] team if needed.  Cathlean Marseilles Tamala Julian, MSN, RN, [facility], [facility]le Pearl, [facility] Wound Treatment Associate Pager [phone]

## 2021-06-07 NOTE — Telephone Encounter (Signed)
Labs received from:[facility]  Drawn on:05/31/2021  Reviewed by:Dr. Bo Merino   Labs drawn:Renal Function   Results:BUN 28  Creat. 2.46  GFR 29  Sodium 146  Potassium 3.3  Chloride 109  Albumin 2.9 Hgb 8.5

## 2021-06-07 NOTE — Procedures (Signed)
EEG Procedure CPT/Type of Study: 31594; 24hr EEG with video Referring Provider: Vijaya Primary Neurological Diagnosis: status epilepticus  History: This is a 61 yr old patient, undergoing an EEG to evaluate for new onset seizures, status epilepticus. Clinical State: disoriented  Technical Description:  The EEG was performed using standard setting per the guidelines of American Clinical Neurophysiology Society (ACNS).  A minimum of 21 electrodes were placed on scalp according to the International 10-20 or/and 10-10 Systems. Supplemental electrodes were placed as needed. Single EKG electrode was also used to detect cardiac arrhythmia. Patient's behavior was continuously recorded on video simultaneously with EEG. A minimum of 16 channels were used for data display. Each epoch of study was reviewed manually daily and as needed using standard referential and bipolar montages. Computerized quantitative EEG analysis (such as compressed spectral array analysis, trending, automated spike & seizure detection) were used as indicated.   Day 4: from 0730 06/06/21 to 0730 06/07/21  EEG Description: Overall Amplitude:Normal Predominant Frequency: The background activity showed posterior dominant alpha, with about 9 Hz, that was frequent. Superimposed Frequencies: occasional theta and some beta activity over the left The background was asymmetric, decreased background over the right  Background Abnormalities: Focal slowing: right hemisphere focal delta-theta Rhythmic or periodic pattern: Lateralized Periodic Discharges: right frontal spiky LPDs, _0 , improved Epileptiform activity: Yes; spiky morphology to LPDs right frontal, improved Electrographic seizures: No Events: no   Breach rhythm: no  Reactivity: Present  Stimulation procedures:  Hyperventilation: not done Photic stimulation: not done  Sleep Background: Stage II  EKG:no significant arrhythmia  Impression: This was an abnormal  continuous video EEG due to improved right frontal LPDs. No seizures were seen. Morphology of the discharges was improved with increased medication treatment.

## 2021-06-07 NOTE — Progress Notes (Signed)
[facility]                                                                               [name], is a 61 y.o. male, DOB - 11-17-1960, KCL:[id number] Admit date - 06/03/2021    Outpatient Primary MD for the patient is [name], Ravisankar, MD  LOS - 4  days    Brief summary   [name] is a 61 y.o. male with medical history significant of CAD, CHF, hypertension, CKD stage IV, Raynard's, GI bleed, recent history of seizures who presents with recurrent seizures.   Assessment & Plan    Assessment and Plan:  Recurrent seizures:  Continues to have tonic clonic seizures.  - seen by neurology and is on continuous EEG monitoring.  - probably triggered by electrolyte imbalance in the setting of baseline seizure activity.  - resume Briviact 100 mg BID. IV vimpat 50 mg every 12 hours, phenytoin 100 mg every 8 hours and scheduled ativan.  - MRI brain without contrast repeated and showed Limited and motion degraded examination. Redemonstration of increased T2 signal affecting the right mesial temporal lobe, hippocampus and right mesial thalamus, presumed to be postictal change. LP done yesterday and he was started on IV antibiotics including rocephin , ampicillin and acyclovir and IV vancomycin.csf analysis show wbc of 161, neutrophils of 93. Total protein of 64. Gram stain shows wbc and no organisms. CSF Cultures are negative.    Hypomagnesemia:  Replaced , recheck levels every day , keep magnesium >4.    Mild acute on Chronic diastolic heart failure:  Appears to have resolved. He appears euvolemic/ dry/ malnourished.  He was on IV lasix 60 mg  on admission (2 doses given). Since he is NPO and not eating much or drinking much. Will d/c IV lasix.  CXR on admission showed moderate right pleural effusion .  Unable to check urine output due to incontinence.  Echocardiogram showed Left ventricular ejection fraction, 40 to 45% with mildly decreased function, and  global  hypokinesis and mild left ventricular hypertrophy. Left ventricular diastolic parameters  are consistent with Grade II diastolic dysfunction (pseudonormalization).  Elevated left atrial pressure. Pt was on torsemide at home, which can be resumed in am .  Patient's family requesting cardiology consult.    Hypocalcemia:  Corrected calcium level is 9.1 with an albumin of 2.2.   Recent C diff colitis infection: Completed the course of antibiotics.  Appears to have resolved.  No diarrhea, abdominal pain , nausea, .    Hyperlipidemia:  On statin which is on hold.    Hypertension:  BP parameters are well controlled.    Stage 4 CKD:  Creatinine so far appears to be at baseline.  Creatinine at 2.7.    Acute metabolic and toxic encephalopathy:  - due to recurrent seizures and delirium.  - he remains lethargic and is high risk for aspiration.  - cortrak placed for nutrition and dietary on board to start the tube feeds.     Tobacco abuse:  - started on nicotine patch daily. - pt smokes a pack of cigarettes a day.   Pulmonary Hypertension:  Resume Opsumit and sildenefil  Anemia of chronic disease:  Normocytic.  Baseline between 7 to 8. Suspect from stage 4 CKD. Anemia panel from last month reviewed.  Adequate iron and folate.  Optimal vitamin b12 levels   Moderate protein and calorie malnutrition;  Body mass index is 16.99 kg/m. Nutrition consulted.  Has cortrak and on tube feeds.  Watch for refeeding syndrome.  Will add thiamine to MVI.   Inview of recurrent admissions , deconditioning and debility, palliative care consulted for goals of care discussions.    RN Pressure Injury Documentation: Pressure Injury 06/03/21 Sacrum Mid Stage 2 -  Partial thickness loss of dermis presenting as a shallow open injury with a red, pink wound bed without slough. (Active)  06/03/21 1654  Location: Sacrum  Location Orientation: Mid  Staging: Stage 2 -  Partial thickness loss of  dermis presenting as a shallow open injury with a red, pink wound bed without slough.  Wound Description (Comments):   Present on Admission: Yes   Interventions: Nepro shake, MVI  Estimated body mass index is 16.99 kg/m as calculated from the following:   Height as of this encounter: _0  (1.753 m).   Weight as of this encounter: 52.2 kg.  Code Status: full code.  DVT Prophylaxis:  heparin injection 5,000 Units Start: 06/05/21 2200   Level of Care: Level of care: Progressive Family Communication: Updated patient's family at bedside.   Disposition Plan:     Remains inpatient appropriate:  recurrent seizures.    Procedures:  EEG.  MRI brain without contrast.   Consultants:   Neurology.  Palliative. Care   Antimicrobials:   Anti-infectives (From admission, onward)    Start     Dose/Rate Route Frequency Ordered Stop   06/07/21 1115  acyclovir (ZOVIRAX) 500 mg in dextrose 5 % 100 mL IVPB        500 mg 110 mL/hr over 60 Minutes Intravenous Every 24 hours 06/07/21 1021     06/06/21 2200  vancomycin (VANCOREADY) IVPB 500 mg/100 mL  Status:  Discontinued        500 mg 100 mL/hr over 60 Minutes Intravenous Every 24 hours 06/05/21 1725 06/07/21 0925   06/05/21 2000  acyclovir (ZOVIRAX) 500 mg in dextrose 5 % 100 mL IVPB  Status:  Discontinued        500 mg 110 mL/hr over 60 Minutes Intravenous Every 24 hours 06/05/21 1721 06/07/21 0925   06/05/21 1900  vancomycin (VANCOREADY) IVPB 1250 mg/250 mL        1,250 mg 166.7 mL/hr over 90 Minutes Intravenous  Once 06/05/21 1724 06/05/21 2019   06/05/21 1800  cefTRIAXone (ROCEPHIN) 2 g in sodium chloride 0.9 % 100 mL IVPB  Status:  Discontinued        2 g 200 mL/hr over 30 Minutes Intravenous Every 12 hours 06/05/21 1708 06/07/21 0925   06/05/21 1800  ampicillin (OMNIPEN) 2 g in sodium chloride 0.9 % 100 mL IVPB  Status:  Discontinued        2 g 300 mL/hr over 20 Minutes Intravenous Every 6 hours 06/05/21 1714 06/05/21 1714   06/05/21  1800  ampicillin (OMNIPEN) 2 g in sodium chloride 0.9 % 100 mL IVPB  Status:  Discontinued        2 g 300 mL/hr over 20 Minutes Intravenous Every 8 hours 06/05/21 1714 06/07/21 0925        Medications  Scheduled Meds:  calcium carbonate  200 mg of elemental calcium Oral BID   chlorhexidine gluconate (MEDLINE  KIT)  15 mL Mouth Rinse BID   feeding supplement (PROSource TF)  45 mL Per Tube Daily   free water  100 mL Per Tube Q4H   heparin  5,000 Units Subcutaneous Q8H   LORazepam  2 mg Intravenous Q8H   macitentan  10 mg Oral Daily   mouth rinse  15 mL Mouth Rinse 10 times per day   [START ON 06/08/2021] multivitamin with minerals  1 tablet Per Tube Daily   nicotine  21 mg Transdermal Daily   phenytoin (DILANTIN) IV  100 mg Intravenous Q8H   [START ON 06/08/2021] rosuvastatin  20 mg Per Tube Daily   thiamine  100 mg Per Tube Daily   Continuous Infusions:  acyclovir (ZOVIRAX) </= 700 mg IVPB     brivaracetam (BRIVIACT) IVPB 100 mg (06/07/21 1027)   feeding supplement (OSMOLITE 1.2 CAL)     lacosamide (VIMPAT) IV 50 mg (06/07/21 1031)   magnesium sulfate bolus IVPB      PRN Meds:.diazepam    Subjective:   [name] was seen and examined today.   Pt somnolent, not following commands . Multiple 4 family members in the room.   Objective:   Vitals:   06/06/21 1958 06/06/21 2343 06/07/21 0335 06/07/21 0842  BP: 129/88 133/82 119/79 128/68  Pulse: 68 96 92 65  Resp: _0 Temp: 97.8 F (36.6 C) 97.6 F (36.4 C) (!) 97.4 F (36.3 C) 97.6 F (36.4 C)  TempSrc: Axillary Axillary Axillary Oral  SpO2:  100% 100% (!) 8%  Weight:      Height:        Intake/Output Summary (Last 24 hours) at 06/07/2021 1105 Last data filed at 06/07/2021 0352 Gross per 24 hour  Intake 1580.85 ml  Output 100 ml  Net 1480.85 ml    Filed Weights   06/03/21 1626  Weight: 52.2 kg     Exam General exam: Ill appearing, malnourished gentleman sedated, not in distress.   Respiratory system: diminished air entry AT BASES,no wheezing heard.  Cardiovascular system: S1 & S2 heard, RRR. No JVD, No pedal edema. Gastrointestinal system: Abdomen is soft, non tender non distended bowel sounds wnl.  Central nervous system: somnolent, not following commands. On continuous EEG  Extremities: no pedal edema.  Skin: No rashes seen. Stage 2 sacrum pressure injury.  Psychiatry: Unable to assess.      Data Reviewed:  I have personally reviewed following labs and imaging studies   CBC Lab Results  Component Value Date   WBC 4.6 06/06/2021   RBC 2.94 (L) 06/06/2021   HGB 8.7 (L) 06/06/2021   HCT 30.5 (L) 06/06/2021   MCV 103.7 (H) 06/06/2021   MCH 29.6 06/06/2021   PLT 210 06/06/2021   MCHC 28.5 (L) 06/06/2021   RDW 18.3 (H) 06/06/2021   LYMPHSABS 0.9 06/06/2021   MONOABS 0.5 06/06/2021   EOSABS 0.1 06/06/2021   BASOSABS 0.0 94/32/7614     Last metabolic panel Lab Results  Component Value Date   NA 144 06/06/2021   K 3.6 06/06/2021   CL 107 06/06/2021   CO2 20 (L) 06/06/2021   BUN 26 (H) 06/06/2021   CREATININE 2.65 (H) 06/06/2021   GLUCOSE 76 06/06/2021   GFRNONAA 26 (L) 06/06/2021   GFRAA 31 (L) 01/02/2020   CALCIUM 7.4 (L) 06/06/2021   PHOS 3.4 06/07/2021   PROT 5.8 (L) 06/04/2021   ALBUMIN 2.0 (L) 06/07/2021   BILITOT 0.5 06/04/2021   ALKPHOS 77 06/04/2021  AST 32 06/04/2021   ALT 21 06/04/2021   ANIONGAP 17 (H) 06/06/2021    CBG (last 3)  Recent Labs    06/07/21 0051 06/07/21 0341 06/07/21 0844  GLUCAP 159* 152* 118*       Coagulation Profile: No results for input(s): INR, PROTIME in the last 168 hours.   Radiology Studies: DG CHEST PORT 1 VIEW  Result Date: 06/06/2021 CLINICAL DATA:  Pleural effusions. EXAM: PORTABLE CHEST 1 VIEW COMPARISON:  06/03/2021 and CT chest 02/20/2021. FINDINGS: Trachea is midline. Heart size stable. Moderate right pleural effusion with bibasilar airspace opacification. Small left pleural  effusion. IMPRESSION: Bilateral pleural effusions, right greater than left, with bibasilar atelectasis and/or pneumonia. Electronically Signed   By: Lorin Picket M.D.   On: 06/06/2021 14:08   DG Abd Portable 1V  Result Date: 06/06/2021 CLINICAL DATA:  Feeding tube placement EXAM: PORTABLE ABDOMEN - 1 VIEW COMPARISON:  10/05/2019 FINDINGS: Enteric tube tip overlies the stomach. Pleural effusions and basilar airspace disease. Cardiomegaly. IMPRESSION: Enteric tube tip overlies the stomach. Electronically Signed   By: Donavan Foil M.D.   On: 06/06/2021 16:08   ECHOCARDIOGRAM COMPLETE  Result Date: 06/06/2021    ECHOCARDIOGRAM REPORT   Patient Name:   [name] Date of Exam: 06/06/2021 Medical Rec #:  [id number]         Height:       69.0 in Accession #:    [id number]        Weight:       115.1 lb Date of Birth:  Feb 20, 1961         BSA:          1.633 m Patient Age:    37 years          BP:           110/62 mmHg Patient Gender: M                 HR:           105 bpm. Exam Location:  Inpatient Procedure: 2D Echo, Cardiac Doppler, Color Doppler and Strain Analysis Indications:    CHF  History:        Patient has prior history of Echocardiogram examinations, most                 recent 08/11/2020. CHF, CAD and Previous Myocardial Infarction;                 Risk Factors:Hypertension and Dyslipidemia.  Sonographer:    Luisa Hart RDCS Referring Phys: Theola Sequin  Sonographer Comments: Image acquisition challenging due to patient behavioral factors. IMPRESSIONS  1. Left ventricular ejection fraction, by estimation, is 40 to 45%. The left ventricle has mildly decreased function. The left ventricle demonstrates global hypokinesis. There is mild left ventricular hypertrophy. Left ventricular diastolic parameters are consistent with Grade II diastolic dysfunction (pseudonormalization). Elevated left atrial pressure.  2. Right ventricular systolic function is mildly reduced. The right ventricular size is  normal. There is mildly elevated pulmonary artery systolic pressure. The estimated right ventricular systolic pressure is 31.4 mmHg.  3. A small pericardial effusion is present.  4. The mitral valve is normal in structure. Mild mitral valve regurgitation.  5. The aortic valve is tricuspid. Aortic valve regurgitation is not visualized. No aortic stenosis is present.  6. The inferior vena cava is normal in size with greater than 50% respiratory variability, suggesting right atrial pressure of 3 mmHg. FINDINGS  Left Ventricle:  Left ventricular ejection fraction, by estimation, is 40 to 45%. The left ventricle has mildly decreased function. The left ventricle demonstrates global hypokinesis. The left ventricular internal cavity size was normal in size. There is  mild left ventricular hypertrophy. Left ventricular diastolic parameters are consistent with Grade II diastolic dysfunction (pseudonormalization). Elevated left atrial pressure. Right Ventricle: The right ventricular size is normal. No increase in right ventricular wall thickness. Right ventricular systolic function is mildly reduced. There is mildly elevated pulmonary artery systolic pressure. The tricuspid regurgitant velocity  is 2.85 m/s, and with an assumed right atrial pressure of 3 mmHg, the estimated right ventricular systolic pressure is 49.2 mmHg. Left [facility]: Left atrial size was normal in size. Right [facility]: Right atrial size was normal in size. Pericardium: A small pericardial effusion is present. Mitral Valve: The mitral valve is normal in structure. Mild mitral valve regurgitation. Tricuspid Valve: The tricuspid valve is normal in structure. Tricuspid valve regurgitation is trivial. Aortic Valve: The aortic valve is tricuspid. Aortic valve regurgitation is not visualized. No aortic stenosis is present. Aortic valve mean gradient measures 4.0 mmHg. Aortic valve peak gradient measures 8.3 mmHg. Aortic valve area, by VTI measures 2.89 cm. Pulmonic  Valve: The pulmonic valve was not well visualized. Pulmonic valve regurgitation is trivial. Aorta: The aortic root is normal in size and structure. Venous: The inferior vena cava is normal in size with greater than 50% respiratory variability, suggesting right atrial pressure of 3 mmHg. IAS/Shunts: The interatrial septum was not well visualized.  LEFT VENTRICLE PLAX 2D LVIDd:         4.50 cm     Diastology LVIDs:         4.00 cm     LV e' medial:    7.07 cm/s LV PW:         1.10 cm     LV E/e' medial:  11.4 LV IVS:        1.00 cm     LV e' lateral:   4.24 cm/s LVOT diam:     2.00 cm     LV E/e' lateral: 19.1 LV SV:         61 LV SV Index:   37 LVOT Area:     3.14 cm  LV Volumes (MOD) LV vol d, MOD A2C: 60.3 ml LV vol d, MOD A4C: 55.3 ml LV vol s, MOD A2C: 50.0 ml LV vol s, MOD A4C: 36.8 ml LV SV MOD A2C:     10.3 ml LV SV MOD A4C:     55.3 ml LV SV MOD BP:      16.1 ml RIGHT VENTRICLE RV Basal diam:  2.30 cm RV Mid diam:    1.40 cm RV S prime:     8.59 cm/s TAPSE (M-mode): 1.2 cm LEFT ATRIUM             Index        RIGHT ATRIUM           Index LA diam:        2.30 cm 1.41 cm/m   RA Area:     13.70 cm LA Vol (A2C):   47.2 ml 28.90 ml/m  RA Volume:   37.70 ml  23.08 ml/m LA Vol (A4C):   31.8 ml 19.47 ml/m LA Biplane Vol: 40.3 ml 24.67 ml/m  AORTIC VALVE                    PULMONIC VALVE AV Area (Vmax):  2.75 cm     PV Vmax:          1.06 m/s AV Area (Vmean):   2.67 cm     PV Vmean:         72.100 cm/s AV Area (VTI):     2.89 cm     PV VTI:           0.175 m AV Vmax:           144.00 cm/s  PV Peak grad:     4.5 mmHg AV Vmean:          96.500 cm/s  PV Mean grad:     2.5 mmHg AV VTI:            0.211 m      PR End Diast Vel: 10.24 msec AV Peak Grad:      8.3 mmHg AV Mean Grad:      4.0 mmHg LVOT Vmax:         126.00 cm/s LVOT Vmean:        82.000 cm/s LVOT VTI:          0.194 m LVOT/AV VTI ratio: 0.92  AORTA Ao Root diam: 2.00 cm Ao Asc diam:  3.20 cm MITRAL VALVE                TRICUSPID VALVE MV Area  (PHT): 9.85 cm     TR Peak grad:   32.5 mmHg MV Decel Time: 77 msec      TR Vmax:        285.00 cm/s MR Peak grad: 89.5 mmHg MR Mean grad: 55.0 mmHg     SHUNTS MR Vmax:      473.00 cm/s   Systemic VTI:  0.19 m MR Vmean:     345.0 cm/s    Systemic Diam: 2.00 cm MV E velocity: 80.80 cm/s MV A velocity: 104.00 cm/s MV E/A ratio:  0.78 Oswaldo Milian MD Electronically signed by Oswaldo Milian MD Signature Date/Time: 06/06/2021/1:13:58 PM    Final        Hosie Poisson M.D. [facility] 06/07/2021, 11:05 AM  Available via Epic secure chat 7am-7pm After 7 pm, please refer to night coverage provider listed on amion.

## 2021-06-08 DIAGNOSIS — I5033 Acute on chronic diastolic (congestive) heart failure: Secondary | ICD-10-CM

## 2021-06-08 DIAGNOSIS — I3139 Other pericardial effusion (noninflammatory): Secondary | ICD-10-CM

## 2021-06-08 DIAGNOSIS — Z7189 Other specified counseling: Secondary | ICD-10-CM | POA: Diagnosis not present

## 2021-06-08 DIAGNOSIS — I272 Pulmonary hypertension, unspecified: Secondary | ICD-10-CM

## 2021-06-08 DIAGNOSIS — R569 Unspecified convulsions: Secondary | ICD-10-CM | POA: Diagnosis not present

## 2021-06-08 DIAGNOSIS — Z515 Encounter for palliative care: Secondary | ICD-10-CM | POA: Diagnosis not present

## 2021-06-08 DIAGNOSIS — Z720 Tobacco use: Secondary | ICD-10-CM

## 2021-06-08 LAB — CBC WITH DIFFERENTIAL/PLATELET
Abs Immature Granulocytes: 0.02 10*3/uL (ref 0.00–0.07)
Basophils Absolute: 0 10*3/uL (ref 0.0–0.1)
Basophils Relative: 1 %
Eosinophils Absolute: 0.2 10*3/uL (ref 0.0–0.5)
Eosinophils Relative: 5 %
HCT: 24.8 % — ABNORMAL LOW (ref 39.0–52.0)
Hemoglobin: 7.4 g/dL — ABNORMAL LOW (ref 13.0–17.0)
Immature Granulocytes: 1 %
Lymphocytes Relative: 22 %
Lymphs Abs: 0.8 10*3/uL (ref 0.7–4.0)
MCH: 30.1 pg (ref 26.0–34.0)
MCHC: 29.8 g/dL — ABNORMAL LOW (ref 30.0–36.0)
MCV: 100.8 fL — ABNORMAL HIGH (ref 80.0–100.0)
Monocytes Absolute: 0.4 10*3/uL (ref 0.1–1.0)
Monocytes Relative: 9 %
Neutro Abs: 2.4 10*3/uL (ref 1.7–7.7)
Neutrophils Relative %: 62 %
Platelets: 181 10*3/uL (ref 150–400)
RBC: 2.46 MIL/uL — ABNORMAL LOW (ref 4.22–5.81)
RDW: 18.1 % — ABNORMAL HIGH (ref 11.5–15.5)
WBC: 3.8 10*3/uL — ABNORMAL LOW (ref 4.0–10.5)
nRBC: 0 % (ref 0.0–0.2)

## 2021-06-08 LAB — PHOSPHORUS
Phosphorus: 3.2 mg/dL (ref 2.5–4.6)
Phosphorus: 3 mg/dL (ref 2.5–4.6)

## 2021-06-08 LAB — COMPREHENSIVE METABOLIC PANEL
ALT: 15 U/L (ref 0–44)
AST: 20 U/L (ref 15–41)
Albumin: 1.8 g/dL — ABNORMAL LOW (ref 3.5–5.0)
Alkaline Phosphatase: 66 U/L (ref 38–126)
Anion gap: 9 (ref 5–15)
BUN: 16 mg/dL (ref 8–23)
CO2: 28 mmol/L (ref 22–32)
Calcium: 7.5 mg/dL — ABNORMAL LOW (ref 8.9–10.3)
Chloride: 114 mmol/L — ABNORMAL HIGH (ref 98–111)
Creatinine, Ser: 2.18 mg/dL — ABNORMAL HIGH (ref 0.61–1.24)
GFR, Estimated: 33 mL/min — ABNORMAL LOW (ref >60)
Glucose, Bld: 94 mg/dL (ref 70–99)
Potassium: 3.7 mmol/L (ref 3.5–5.1)
Sodium: 151 mmol/L — ABNORMAL HIGH (ref 135–145)
Total Bilirubin: 0.2 mg/dL — ABNORMAL LOW (ref 0.3–1.2)
Total Protein: 5.4 g/dL — ABNORMAL LOW (ref 6.5–8.1)

## 2021-06-08 LAB — CSF CULTURE W GRAM STAIN: Culture: NO GROWTH

## 2021-06-08 LAB — GLUCOSE, CAPILLARY: Glucose-Capillary: 86 mg/dL (ref 70–99)

## 2021-06-08 LAB — HSV 1/2 PCR, CSF
HSV-1 DNA: NEGATIVE
HSV-2 DNA: NEGATIVE

## 2021-06-08 LAB — MAGNESIUM
Magnesium: 2.6 mg/dL — ABNORMAL HIGH (ref 1.7–2.4)
Magnesium: 2.3 mg/dL (ref 1.7–2.4)

## 2021-06-08 MED ORDER — FREE WATER
250.0000 mL | Status: DC
  Administered 2021-06-08 – 2021-06-11 (×16): 250 mL

## 2021-06-08 MED ORDER — POTASSIUM CHLORIDE CRYS ER 20 MEQ PO TBCR
40.0000 meq | EXTENDED_RELEASE_TABLET | Freq: Once | ORAL | Status: AC
  Administered 2021-06-08: 40 meq via ORAL
  Filled 2021-06-08: qty 2

## 2021-06-08 NOTE — Progress Notes (Signed)
Occupational Therapy Treatment Patient Details Name: [name] MRN: [id number] DOB: 1960-05-18 Today's Date: 06/08/2021   History of present illness Pt is a 61 y/o male who presents with recurrent seizures. PMH significant for CAD, CHF, HTN, CKD IV, Raynard's, GIB, recent history of seizures, hypothyroidism.   OT comments  [name] did not demonstrate progress towards his goals this session, he was limited by level of arousal and required increased assist for all tasks assessed. With max cues pt attempted to assist with BUE ROM exercises (listed below), trace muscle activation noted. Hand over hand provided for grooming at bed level. Education provided to wife throughout. OT to continue to follow acutely. D/c updated to home as wife has reported that she wishes to take him home. PT will need maximal HH services and DME.    Recommendations for follow up therapy are one component of a multi-disciplinary discharge planning process, led by the attending physician.  Recommendations may be updated based on patient status, additional functional criteria and insurance authorization.    Follow Up Recommendations  Home health OT (wife wishes to take pt home.)    Assistance Recommended at Discharge Frequent or constant Supervision/Assistance  Patient can return home with the following  A lot of help with walking and/or transfers;A lot of help with bathing/dressing/bathroom;Assistance with feeding;Direct supervision/assist for medications management;Assist for transportation;Help with stairs or ramp for entrance   Equipment Recommendations  BSC/3in1;Wheelchair (measurements OT);Wheelchair cushion (measurements OT);Hospital bed (hoyer)       Precautions / Restrictions Precautions Precautions: Fall Precaution Comments: Currently on continuous EEG; periods of agitation Restrictions Weight Bearing Restrictions: No       Mobility Bed Mobility Overal bed mobility: Needs Assistance Bed Mobility:  Rolling Rolling: Total assist         General bed mobility comments: attempting to assist for rolling, muscle activiation noted. required max direct cues. ultimately total A    Transfers                   General transfer comment: NT - pt lethargic and irritable/agitated when pushed         ADL either performed or assessed with clinical judgement   ADL Overall ADL's : Needs assistance/impaired Eating/Feeding: NPO   Grooming: Total assistance;Oral care Grooming Details (indicate cue type and reason): hand over hand with mouth swab to moisturize lips. Pt tolerating with both UEs                             Functional mobility during ADLs: Total assistance (bed level) General ADL Comments: limited by level of arousal. total A for all tasks at bed level    Extremity/Trunk Assessment Upper Extremity Assessment Upper Extremity Assessment: Generalized weakness;Difficult to assess due to impaired cognition   Lower Extremity Assessment Lower Extremity Assessment: Defer to PT evaluation        Vision   Vision Assessment?: Vision impaired- to be further tested in functional context   Perception Perception Perception: Not tested   Praxis Praxis Praxis: Not tested    Cognition Arousal/Alertness: Awake/alert, Lethargic Behavior During Therapy: Restless Overall Cognitive Status: Impaired/Different from baseline               General Comments: extremely lethargic, opened eyes 3x to command only. tolerating OT PROM and hand over hand grooming tasks. attempted to follow movement commands during ROM        Exercises Exercises: General Upper Extremity General Exercises -  Upper Extremity Shoulder Flexion: PROM, Both, 10 reps, Supine Shoulder Extension: PROM, Both, 10 reps, Supine Elbow Flexion: PROM, Both, 10 reps, Supine Elbow Extension: PROM, Both, 10 reps, Supine Wrist Flexion: PROM, Both, 10 reps, Supine Wrist Extension: PROM, Both, 10 reps,  Supine Digit Composite Flexion: PROM, Both, 10 reps, Seated Composite Extension: PROM, Both, 10 reps, Supine    Shoulder Instructions       General Comments VSS. wife present and supportive    Pertinent Vitals/ Pain       Pain Assessment Pain Assessment: Faces Faces Pain Scale: No hurt Pain Intervention(s): Monitored during session   Frequency  Min 2X/week        Progress Toward Goals  OT Goals(current goals can now be found in the care plan section)  Progress towards OT goals: Not progressing toward goals - comment  Acute Rehab OT Goals Patient Stated Goal: unable to state OT Goal Formulation: With patient/family Time For Goal Achievement: 06/19/21 Potential to Achieve Goals: Good ADL Goals Pt Will Perform Grooming: with modified independence;standing Pt Will Perform Upper Body Dressing: with modified independence;sitting Pt Will Perform Lower Body Dressing: with modified independence;sit to/from stand Pt Will Transfer to Toilet: with supervision;ambulating Pt/caregiver will Perform Home Exercise Program: Increased strength;Both right and left upper extremity;With written HEP provided Additional ADL Goal #1: Pt will indep follow multi-step ADL task  Plan Discharge plan needs to be updated       AM-PAC OT "6 Clicks" Daily Activity     Outcome Measure   Help from another person eating meals?: Total Help from another person taking care of personal grooming?: Total Help from another person toileting, which includes using toliet, bedpan, or urinal?: Total Help from another person bathing (including washing, rinsing, drying)?: Total Help from another person to put on and taking off regular upper body clothing?: Total Help from another person to put on and taking off regular lower body clothing?: Total 6 Click Score: 6    End of Session Equipment Utilized During Treatment: Oxygen  OT Visit Diagnosis: Other abnormalities of gait and mobility (R26.89);Muscle  weakness (generalized) (M62.81);Unsteadiness on feet (R26.81)   Activity Tolerance Patient tolerated treatment well;Patient limited by lethargy   Patient Left in bed;with call bell/phone within reach;with bed alarm set;with family/visitor present   Nurse Communication Mobility status        Time: 1661-9694 OT Time Calculation (min): 18 min  Charges: OT General Charges $OT Visit: 1 Visit OT Treatments $Therapeutic Activity: 8-22 mins   Sherrita Riederer A Child Campoy 06/08/2021, 2:57 PM

## 2021-06-08 NOTE — Progress Notes (Signed)
EEG maintenance performed.  No skin breakdown observed at electrode sites Fp1, Fp2, T7, Cz.

## 2021-06-08 NOTE — Progress Notes (Signed)
LTM maint complete - no skin breakdown  Serviced A1 P3  [facility] monitored, Event button test confirmed by [facility].

## 2021-06-08 NOTE — Progress Notes (Signed)
Subjective: Interval History:    Wife  at the bedside. He is improving, talking some, follows commands. No reported seizures.  Discussed CSF cultures are negative and have stopped the antibiotics. PCR is still pending and antiviral acyclovir will continue for now.   Objective: Vital signs in last 24 hours: Temp:  [97.3 F (36.3 C)-97.9 F (36.6 C)] 97.9 F (36.6 C) (02/15 1121) Pulse Rate:  [72-88] 88 (02/15 1121) Resp:  [14-16] 14 (02/15 1121) BP: (117-133)/(64-83) 133/80 (02/15 1121) SpO2:  [97 %-100 %] 100 % (02/15 1121)  Intake/Output from previous day: 02/14 0701 - 02/15 0700 In: 275 [NG/GT:200; IV Piggyback:75] Out: 100 [Emesis/NG output:100] Intake/Output this shift: No intake/output data recorded. Nutritional status:  Diet Order             Diet NPO time specified  Diet effective now                   General appearance: no distress.  Laying in bed resting. EEG leads on. Respiratory: Nonlabored.  Neuro: He is awake. Looking around some. Will follow commands.  Pupils equal round reactive. He will attempt to speak but unintelligible with paucity of speech. Extremities: moves both UE off bed to command 3/5. LE will attempt to move but very weak 2/5.  Sensory: intact Reduced muscle bulk.   Lab Results:  Recent Labs    06/06/21 0233 06/08/21 0435  WBC 4.6 3.8*  HGB 8.7* 7.4*  HCT 30.5* 24.8*  PLT 210 181  NA 144 151*  K 3.6 3.7  CL 107 114*  CO2 20* 28  GLUCOSE 76 94  BUN 26* 16  CREATININE 2.65* 2.18*  CALCIUM 7.4* 7.5*    Lipid Panel No results for input(s): CHOL, TRIG, HDL, CHOLHDL, VLDL, LDLCALC in the last 72 hours.  Studies/Results: No results found.  Medications: I have reviewed the patient's current medications. Scheduled:  calcium carbonate  200 mg of elemental calcium Oral BID   chlorhexidine gluconate (MEDLINE KIT)  15 mL Mouth Rinse BID   feeding supplement (PROSource TF)  45 mL Per Tube Daily   free water  100 mL Per Tube  Q4H   heparin  5,000 Units Subcutaneous Q8H   LORazepam  2 mg Intravenous Q8H   macitentan  10 mg Oral Daily   mouth rinse  15 mL Mouth Rinse 10 times per day   multivitamin with minerals  1 tablet Per Tube Daily   nicotine  21 mg Transdermal Daily   pantoprazole sodium  40 mg Per Tube Daily   phenytoin (DILANTIN) IV  75 mg Intravenous Q8H   rosuvastatin  20 mg Per Tube Daily   sildenafil  40 mg Per Tube TID   thiamine  100 mg Per Tube Daily   torsemide  20 mg Per Tube Daily   Continuous:  acyclovir (ZOVIRAX) </= 700 mg IVPB 500 mg (06/08/21 1146)   brivaracetam (BRIVIACT) IVPB 100 mg (06/08/21 1024)   feeding supplement (OSMOLITE 1.2 CAL) 1,000 mL (06/07/21 1259)   lacosamide (VIMPAT) IV 50 mg (06/08/21 1046)   MLY:[id number], diazepam  Assessment/Plan: MRI brain 2/11: - Limited and motion degraded examination. Redemonstration of increased T2 signal affecting the right mesial temporal lobe, hippocampus and right mesial thalamus, presumed to be postictal change. I do not see low signal on the ADC map to suggest actual brain infarction. - Question developmental venous anomaly in the right basal ganglia and radiating white matter tracts.   Overnight EEG  2/11 - 2/12:"This  was an abnormal continuous video EEG due to right frontal LPD-plus activity without any further ictal evolution."  2/12 - 2/13:This was an abnormal continuous video EEG due to right frontal LPDs with less overriding fast activity in the evening. No ictal evolution was seen.   2/13 -2/14:  improved right frontal LPDs. No seizures were seen. Morphology of the discharges was improved with increased medication treatment.   2/14-2/15: Impression: This was an abnormal continuous video EEG due to improved right frontal LPDs. No seizures were seen. The discharges were only present in sleep.    Assessment: 61 y.o. male who presented to the ED for evaluation of multiple seizures at home with concern for LPDs on outpatient  EEG. Recently discharged from the ED on 2/4 after his first seizure with outpatient neurology follow up. Initial ED work up revealed a magnesium of 1.0 and ionized calcium of 0.86.   He had LP completed cultures were negative and ABX discontinued. Currently on antiviral pending PCR.  Overnight EEG showed improved right frontal LPDs only present during sleep now with no seizures.   Recommendations: - Continue LTM EEG-still showing LPD's overnight. -Scheduled ativan stopped today. Valium prn order stopped.This should help his mental status. If no seizures overnight, will stop LTM.  - Continue seizure precautions - Continue Briviact 177m BID and phenytoin 75 mg q8h IV -Con't Vimpat 50 mg IV twice daily.  Keep at a low dose due to creatinine. - Continue Antiviral pending PCR - Delirium precautions - Neurology will continue to follow  Discussed plan with patient's wife who is a caretaker.   LOS: 5 days   GDorene Grebe Neuro NP Can be reached via Epic secure message 06/08/21 4:08 PM   Total of 35 mins spent reviewing chart, discussion with patient and family on prognosis, Dx and plan. Discussed case with patient's nurse. Reviewed Imaging personally.   Nneoma Harral,MD

## 2021-06-08 NOTE — Progress Notes (Signed)
[name]  BUL:[id number] DOB: Jan 10, 1961 DOA: 06/03/2021 PCP: [name], MD    Brief Narrative:  61 year old with a history of CAD, CHF, HTN, CKD stage IV, Raynaud's, GIB, and a recent history of seizures who presented to the ED with recurrent seizure activity.  Consultants:  Neurology CHF Team   Code Status: FULL CODE  Antimicrobials:  None  DVT prophylaxis: Subcutaneous heparin  Interim Hx: Afebrile.  Vital signs stable.  Saturation 100% on 2 L.  Sedate but will respond minimally to touch.  Does not appear to be uncomfortable.  Assessment & Plan:  Recurrent seizure activity Neurology following -has been difficult to manage requiring multiple medication adjustments  Acute toxic metabolic encephalopathy LP accomplished and patient initially empirically covered with antibiotics which have subsequently been stopped with negative cultures -testing for HSV still pending  Severe hypomagnesemia Magnesium 1.0 at initial presentation -corrected with supplementation  Macrocytic anemia B12 455  Hypernatremia Increase free water  Chronic diastolic CHF with acute exacerbation No volume overload appreciated on exam -CHF team following  Hypocalcemia Ionized calcium 0.86 at initial presentation  Pulmonary hypertension Appears well compensated presently  Anemia of chronic kidney disease  Severe protein calorie malnutrition Continue tube feeds supplementation  HLD Continue medical therapy  HTN Blood pressure currently well controlled  Stage IV CKD Baseline creatinine approximately 2.7  Tobacco abuse   Family Communication: Spoke with wife and multiple other family members at bedside Disposition: From home -disposition unclear presently  Objective: Blood pressure 117/64, pulse 86, temperature 97.7 F (36.5 C), temperature source Oral, resp. rate 16, height _0  (1.753 m), weight 52.2 kg, SpO2 100 %.  Intake/Output Summary (Last 24 hours) at  06/08/2021 1027 Last data filed at 06/08/2021 0112 Gross per 24 hour  Intake 275 ml  Output 100 ml  Net 175 ml   Filed Weights   06/03/21 1626  Weight: 52.2 kg    Examination: General: No acute respiratory distress Lungs: Clear to auscultation bilaterally without wheezes or crackles Cardiovascular: Regular rate and rhythm without murmur gallop or rub normal S1 and S2 Abdomen: Nontender, nondistended, soft, bowel sounds positive, no rebound, no ascites, no appreciable mass Extremities: No significant cyanosis, clubbing, or edema bilateral lower extremities  CBC: Recent Labs  Lab 06/05/21 0209 06/06/21 0233 06/08/21 0435  WBC 5.0 4.6 3.8*  NEUTROABS 3.7 3.1 2.4  HGB 7.3* 8.7* 7.4*  HCT 24.9* 30.5* 24.8*  MCV 99.6 103.7* 100.8*  PLT 180 210 321   Basic Metabolic Panel: Recent Labs  Lab 06/05/21 0209 06/06/21 0233 06/06/21 1803 06/07/21 0444 06/07/21 1635 06/08/21 0435  NA 143 144  --   --   --  151*  K 3.6 3.6  --   --   --  3.7  CL 106 107  --   --   --  114*  CO2 24 20*  --   --   --  28  GLUCOSE 89 76  --   --   --  94  BUN 25* 26*  --   --   --  16  CREATININE 2.77* 2.65*  --   --   --  2.18*  CALCIUM 7.7* 7.4*  --   --   --  7.5*  MG 1.8 1.7   < > 1.6* 3.3* 2.6*  PHOS 4.2 4.5   < > 3.4 3.1 3.2   < > = values in this interval not displayed.   GFR: Estimated Creatinine Clearance: 25.9 mL/min (A) (  by C-G formula based on SCr of 2.18 mg/dL (H)).  Liver Function Tests: Recent Labs  Lab 06/03/21 1627 06/04/21 0608 06/07/21 0444 06/08/21 0435  AST 44* 32  --  20  ALT 24 21  --  15  ALKPHOS 89 77  --  66  BILITOT 0.7 0.5  --  0.2*  PROT 6.1* 5.8*  --  5.4*  ALBUMIN 2.3* 2.2* 2.0* 1.8*   HbA1C: Hgb A1c MFr Bld  Date/Time Value Ref Range Status  10/09/2019 04:50 AM 4.9 4.8 - 5.6 % Final    Comment:    (NOTE)         Prediabetes: 5.7 - 6.4         Diabetes: >6.4         Glycemic control for adults with diabetes: <7.0     CBG: Recent Labs  Lab  06/07/21 0844 06/07/21 1209 06/07/21 1641 06/07/21 2046 06/08/21 0739  GLUCAP 118* 116* 121* 150* 86    Recent Results (from the past 240 hour(s))  Resp Panel by RT-PCR (Flu A&B, Covid) Nasopharyngeal Swab     Status: None   Collection Time: 06/03/21  7:52 PM   Specimen: Nasopharyngeal Swab; Nasopharyngeal(NP) swabs in vial transport medium  Result Value Ref Range Status   SARS Coronavirus 2 by RT PCR NEGATIVE NEGATIVE Final    Comment: (NOTE) SARS-CoV-2 target nucleic acids are NOT DETECTED.  The SARS-CoV-2 RNA is generally detectable in upper respiratory specimens during the acute phase of infection. The lowest concentration of SARS-CoV-2 viral copies this assay can detect is 138 copies/mL. A negative result does not preclude SARS-Cov-2 infection and should not be used as the sole basis for treatment or other patient management decisions. A negative result may occur with  improper specimen collection/handling, submission of specimen other than nasopharyngeal swab, presence of viral mutation(s) within the areas targeted by this assay, and inadequate number of viral copies(<138 copies/mL). A negative result must be combined with clinical observations, patient history, and epidemiological information. The expected result is Negative.  Fact Sheet for Patients:  EntrepreneurPulse.com.au  Fact Sheet for Healthcare Providers:  IncredibleEmployment.be  This test is no t yet approved or cleared by the [place] FDA and  has been authorized for detection and/or diagnosis of SARS-CoV-2 by FDA under an Emergency Use Authorization (EUA). This EUA will remain  in effect (meaning this test can be used) for the duration of the COVID-19 declaration under Section 564(b)(1) of the Act, 21 U.S.C.section 360bbb-3(b)(1), unless the authorization is terminated  or revoked sooner.       Influenza A by PCR NEGATIVE NEGATIVE Final   Influenza B by PCR  NEGATIVE NEGATIVE Final    Comment: (NOTE) The Xpert Xpress SARS-CoV-2/FLU/RSV plus assay is intended as an aid in the diagnosis of influenza from Nasopharyngeal swab specimens and should not be used as a sole basis for treatment. Nasal washings and aspirates are unacceptable for Xpert Xpress SARS-CoV-2/FLU/RSV testing.  Fact Sheet for Patients: EntrepreneurPulse.com.au  Fact Sheet for Healthcare Providers: IncredibleEmployment.be  This test is not yet approved or cleared by the [place] FDA and has been authorized for detection and/or diagnosis of SARS-CoV-2 by FDA under an Emergency Use Authorization (EUA). This EUA will remain in effect (meaning this test can be used) for the duration of the COVID-19 declaration under Section 564(b)(1) of the Act, 21 U.S.C. section 360bbb-3(b)(1), unless the authorization is terminated or revoked.  Performed at [facility], [address],  [place] [address]   CSF culture w Gram Stain     Status: None (Preliminary result)   Collection Time: 06/05/21  1:50 PM   Specimen: CSF; Cerebrospinal Fluid  Result Value Ref Range Status   Specimen Description CSF  Final   Special Requests LP  Final   Gram Stain   Final    WBC PRESENT, PREDOMINANTLY PMN NO ORGANISMS SEEN CYTOSPIN SMEAR    Culture   Final    NO GROWTH 2 DAYS Performed at [facility], [place] [address] [place], [place] [address]    Report Status PENDING  Incomplete     Scheduled Meds:  calcium carbonate  200 mg of elemental calcium Oral BID   chlorhexidine gluconate (MEDLINE KIT)  15 mL Mouth Rinse BID   feeding supplement (PROSource TF)  45 mL Per Tube Daily   free water  100 mL Per Tube Q4H   heparin  5,000 Units Subcutaneous Q8H   LORazepam  2 mg Intravenous Q8H   macitentan  10 mg Oral Daily   mouth rinse  15 mL Mouth Rinse 10 times per day   multivitamin with minerals  1 tablet Per Tube Daily   nicotine  21 mg  Transdermal Daily   pantoprazole sodium  40 mg Per Tube Daily   phenytoin (DILANTIN) IV  75 mg Intravenous Q8H   rosuvastatin  20 mg Per Tube Daily   sildenafil  40 mg Per Tube TID   thiamine  100 mg Per Tube Daily   torsemide  20 mg Per Tube Daily   Continuous Infusions:  acyclovir (ZOVIRAX) </= 700 mg IVPB 500 mg (06/07/21 1100)   brivaracetam (BRIVIACT) IVPB 100 mg (06/08/21 1024)   feeding supplement (OSMOLITE 1.2 CAL) 1,000 mL (06/07/21 1259)   lacosamide (VIMPAT) IV 50 mg (06/07/21 2155)     LOS: 5 days   Cherene Altes, MD [facility] Office  [phone] Pager - Text Page per Shea Evans  If 7PM-7AM, please contact night-coverage per Amion 06/08/2021, 10:27 AM

## 2021-06-08 NOTE — Progress Notes (Signed)
Palliative:  HPI: 61 y.o. male  with past medical history of CAD, CHF, HTN, HLD, MI, CKD stage 4, GI bleed, Raynaud's, scleroderma with pulmonary hypertension, seizures, gout, smoker admitted on 06/03/2021 with recurrent seizures. Recent admission for C. diff colitis and metabolic acidosis 0/10/07-05/15/95. First seizure noted to be 05/28/21 with family noting some short term memory loss since seizures began. Also noted that they had not been able to obtain Briviact as outpatient yet.   I met today at [name] bedside with wife and daughter. They express that they feel that they are making very small and slow progress and are still hopeful for eventual improvement. He is sedated at time of my visit but she reports that he is more alert in between lorazepam doses. They have been through many serious illnesses and have always been by his side to help him to improve and then rehab him at their home. They anticipate the same result this time. Wife continues to report that until she hears some objective data indicating a point of no return or hope of improvement they will continue to push forward with full scope care. Goals remain clear.   All questions/concerns addressed. Emotional support provided.   Exam: Sedated. Continuous EEG. No distress. Thin, frail, cachectic. Breathing regular, unlabored. Abd flat. Warm to touch.   Plan: - Full code, full scope.  - Wife will call palliative service for any further needs or conversations.   25 min  Vinie Sill, NP Palliative Medicine Team Pager [phone] (Please see [email or web address] for schedule) Team Phone [phone]    Greater than 50%  of this time was spent counseling and coordinating care related to the above assessment and plan

## 2021-06-08 NOTE — Procedures (Signed)
EEG Procedure CPT/Type of Study: 46568; 24hr EEG with video Referring Provider: Vijaya Primary Neurological Diagnosis: status epilepticus  History: This is a 61 yr old patient, undergoing an EEG to evaluate for new onset seizures, status epilepticus. Clinical State: disoriented  Technical Description:  The EEG was performed using standard setting per the guidelines of American Clinical Neurophysiology Society (ACNS).  A minimum of 21 electrodes were placed on scalp according to the International 10-20 or/and 10-10 Systems. Supplemental electrodes were placed as needed. Single EKG electrode was also used to detect cardiac arrhythmia. Patient's behavior was continuously recorded on video simultaneously with EEG. A minimum of 16 channels were used for data display. Each epoch of study was reviewed manually daily and as needed using standard referential and bipolar montages. Computerized quantitative EEG analysis (such as compressed spectral array analysis, trending, automated spike & seizure detection) were used as indicated.   Day 5: from 0730 06/07/21 to 0730 06/08/21  EEG Description: Overall Amplitude:Normal Predominant Frequency: The background activity showed posterior dominant alpha, with about 9 Hz, that was frequent. Superimposed Frequencies: occasional theta and some beta activity over the left The background was asymmetric, decreased background over the right  Background Abnormalities: Focal slowing: right hemisphere focal delta-theta Rhythmic or periodic pattern: Lateralized Periodic Discharges: right frontal LPDs, 1Hz, improved and seen in sleep only Epileptiform activity: Yes; improved LPD morphology Electrographic seizures: No Events: no   Breach rhythm: no  Reactivity: Present  Stimulation procedures:  Hyperventilation: not done Photic stimulation: not done  Sleep Background: Stage II  EKG:no significant arrhythmia  Impression: This was an abnormal continuous  video EEG due to improved right frontal LPDs. No seizures were seen. The discharges were only present in sleep.
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
## 2021-06-08 NOTE — Progress Notes (Signed)
SLP Cancellation Note  Patient Details Name: [name] MRN: [id number] DOB: 13-Oct-1960   Cancelled treatment:       Reason Eval/Treat Not Completed: Fatigue/lethargy limiting ability to participate (Pt inadequately alert to participate in treatment. SLP will follow up on subsequent date.)  Kiarrah Rausch I. Hardin Negus, [place], [place] Office number [phone] Pager [place] 06/08/2021, 8:57 AM

## 2021-06-09 DIAGNOSIS — R569 Unspecified convulsions: Secondary | ICD-10-CM | POA: Diagnosis not present

## 2021-06-09 LAB — BASIC METABOLIC PANEL
Anion gap: 11 (ref 5–15)
BUN: 20 mg/dL (ref 8–23)
CO2: 28 mmol/L (ref 22–32)
Calcium: 7.8 mg/dL — ABNORMAL LOW (ref 8.9–10.3)
Chloride: 111 mmol/L (ref 98–111)
Creatinine, Ser: 2.26 mg/dL — ABNORMAL HIGH (ref 0.61–1.24)
GFR, Estimated: 32 mL/min — ABNORMAL LOW (ref >60)
Glucose, Bld: 105 mg/dL — ABNORMAL HIGH (ref 70–99)
Potassium: 4.2 mmol/L (ref 3.5–5.1)
Sodium: 150 mmol/L — ABNORMAL HIGH (ref 135–145)

## 2021-06-09 LAB — CBC WITH DIFFERENTIAL/PLATELET
Abs Immature Granulocytes: 0.01 10*3/uL (ref 0.00–0.07)
Basophils Absolute: 0 10*3/uL (ref 0.0–0.1)
Basophils Relative: 1 %
Eosinophils Absolute: 0.2 10*3/uL (ref 0.0–0.5)
Eosinophils Relative: 4 %
HCT: 28.7 % — ABNORMAL LOW (ref 39.0–52.0)
Hemoglobin: 8.5 g/dL — ABNORMAL LOW (ref 13.0–17.0)
Immature Granulocytes: 0 %
Lymphocytes Relative: 29 %
Lymphs Abs: 1.2 10*3/uL (ref 0.7–4.0)
MCH: 30.2 pg (ref 26.0–34.0)
MCHC: 29.6 g/dL — ABNORMAL LOW (ref 30.0–36.0)
MCV: 102.1 fL — ABNORMAL HIGH (ref 80.0–100.0)
Monocytes Absolute: 0.4 10*3/uL (ref 0.1–1.0)
Monocytes Relative: 10 %
Neutro Abs: 2.3 10*3/uL (ref 1.7–7.7)
Neutrophils Relative %: 56 %
Platelets: 179 10*3/uL (ref 150–400)
RBC: 2.81 MIL/uL — ABNORMAL LOW (ref 4.22–5.81)
RDW: 18.1 % — ABNORMAL HIGH (ref 11.5–15.5)
WBC: 4 10*3/uL (ref 4.0–10.5)
nRBC: 0 % (ref 0.0–0.2)

## 2021-06-09 LAB — MAGNESIUM: Magnesium: 2 mg/dL (ref 1.7–2.4)

## 2021-06-09 LAB — BRAIN NATRIURETIC PEPTIDE: B Natriuretic Peptide: 606.2 pg/mL — ABNORMAL HIGH (ref 0.0–100.0)

## 2021-06-09 MED ORDER — PHENYTOIN 125 MG/5ML PO SUSP
75.0000 mg | Freq: Three times a day (TID) | ORAL | Status: DC
  Administered 2021-06-09 – 2021-06-10 (×3): 75 mg
  Filled 2021-06-09 (×3): qty 4

## 2021-06-09 MED ORDER — FLUCONAZOLE 40 MG/ML PO SUSR: 160.0000 mg | Freq: Every day | ORAL | Status: DC

## 2021-06-09 MED ORDER — LACOSAMIDE 50 MG PO TABS
50.0000 mg | ORAL_TABLET | Freq: Two times a day (BID) | ORAL | Status: DC
  Administered 2021-06-09 – 2021-06-12 (×6): 50 mg
  Filled 2021-06-09 (×6): qty 1

## 2021-06-09 MED ORDER — BRIVARACETAM 50 MG PO TABS: 100.0000 mg | ORAL_TABLET | Freq: Two times a day (BID) | ORAL | Status: DC

## 2021-06-09 MED ORDER — BRIVARACETAM 10 MG/ML PO SOLN: 100.0000 mg | Freq: Two times a day (BID) | ORAL | Status: DC

## 2021-06-09 MED ORDER — SODIUM CHLORIDE 0.9 % IV SOLN
100.0000 mg | Freq: Two times a day (BID) | INTRAVENOUS | Status: DC
  Administered 2021-06-09 – 2021-06-11 (×5): 100 mg via INTRAVENOUS
  Filled 2021-06-09 (×9): qty 10

## 2021-06-09 MED ORDER — DARBEPOETIN ALFA 25 MCG/0.42ML IJ SOSY
25.0000 ug | PREFILLED_SYRINGE | Freq: Once | INTRAMUSCULAR | Status: AC
  Administered 2021-06-09: 25 ug via SUBCUTANEOUS
  Filled 2021-06-09: qty 0.42

## 2021-06-09 MED ORDER — FLUCONAZOLE 40 MG/ML PO SUSR
100.0000 mg | Freq: Every day | ORAL | Status: DC
  Administered 2021-06-09 – 2021-06-12 (×4): 100 mg
  Filled 2021-06-09 (×5): qty 2.5

## 2021-06-09 MED ORDER — EPOETIN ALFA-EPBX 10000 UNIT/ML IJ SOLN: 10000.0000 [IU] | Freq: Once | INTRAMUSCULAR | Status: DC

## 2021-06-09 NOTE — Progress Notes (Signed)
Subjective: Interval History:    Wife  at the bedside.  More alert, more interactive today.He tells me he wants to be left alone.  follows commands. No reported seizures.  Objective: Vital signs in last 24 hours: Temp:  [97.5 F (36.4 C)-98.1 F (36.7 C)] 97.5 F (36.4 C) (02/16 0747) Pulse Rate:  [82-100] 82 (02/16 0747) Resp:  [14-18] 16 (02/16 0747) BP: (121-147)/(69-84) 147/84 (02/16 0747) SpO2:  [90 %-100 %] 100 % (02/16 0747) Weight:  [49.9 kg] 49.9 kg (02/15 1729)  Intake/Output from previous day: 02/15 0701 - 02/16 0700 In: 925 [NG/GT:750; IV Piggyback:175] Out: 50 [Emesis/NG output:50] Intake/Output this shift: No intake/output data recorded. Nutritional status:  Diet Order             Diet NPO time specified  Diet effective now                   General appearance: no distress.  Laying in bed resting. EEG leads on. Respiratory: Nonlabored.  Neuro: He is awake. Looking around some. Will follow commands.  Open close eyse. Pupils equal round reactive. He can tell me his wife's name, but did not tell me his. He told me hew wants to be left alone. Speech is dysarthric.  Extremities: moves both UE off bed to command 3/5. LE will attempt to move but very weak 2/5.  Sensory: intact Reduced muscle bulk.   Lab Results:  Recent Labs    06/08/21 0435 06/09/21 0247  WBC 3.8* 4.0  HGB 7.4* 8.5*  HCT 24.8* 28.7*  PLT 181 179  NA 151* 150*  K 3.7 4.2  CL 114* 111  CO2 28 28  GLUCOSE 94 105*  BUN 16 20  CREATININE 2.18* 2.26*  CALCIUM 7.5* 7.8*    Lipid Panel No results for input(s): CHOL, TRIG, HDL, CHOLHDL, VLDL, LDLCALC in the last 72 hours.  Studies/Results: No results found.  Medications: I have reviewed the patient's current medications. Scheduled:  calcium carbonate  200 mg of elemental calcium Oral BID   chlorhexidine gluconate (MEDLINE KIT)  15 mL Mouth Rinse BID   feeding supplement (PROSource TF)  45 mL Per Tube Daily   free water   250 mL Per Tube Q4H   heparin  5,000 Units Subcutaneous Q8H   macitentan  10 mg Oral Daily   mouth rinse  15 mL Mouth Rinse 10 times per day   multivitamin with minerals  1 tablet Per Tube Daily   nicotine  21 mg Transdermal Daily   pantoprazole sodium  40 mg Per Tube Daily   phenytoin (DILANTIN) IV  75 mg Intravenous Q8H   rosuvastatin  20 mg Per Tube Daily   sildenafil  40 mg Per Tube TID   thiamine  100 mg Per Tube Daily   Continuous:  acyclovir (ZOVIRAX) </= 700 mg IVPB 500 mg (06/08/21 1146)   brivaracetam (BRIVIACT) IVPB 100 mg (06/08/21 2242)   feeding supplement (OSMOLITE 1.2 CAL) 1,000 mL (06/07/21 1259)   lacosamide (VIMPAT) IV 50 mg (06/09/21 0942)   ERD:[id number]  Assessment/Plan: MRI brain 2/11: - Limited and motion degraded examination. Redemonstration of increased T2 signal affecting the right mesial temporal lobe, hippocampus and right mesial thalamus, presumed to be postictal change. I do not see low signal on the ADC map to suggest actual brain infarction. - Question developmental venous anomaly in the right basal ganglia and radiating white matter tracts.   Overnight EEG  2/11 - 2/12:"This was an abnormal continuous video  EEG due to right frontal LPD-plus activity without any further ictal evolution."  2/12 - 2/13:This was an abnormal continuous video EEG due to right frontal LPDs with less overriding fast activity in the evening. No ictal evolution was seen.   2/13 -2/14:  improved right frontal LPDs. No seizures were seen. Morphology of the discharges was improved with increased medication treatment.   2/14-2/15: Impression: This was an abnormal continuous video EEG due to improved right frontal LPDs. No seizures were seen. The discharges were only present in sleep.   2/15-2/16 LTM: Impression: This was an abnormal continuous video EEG due to only rarely-occurring right frontal LPDs. No seizures were seen.   Assessment: 61 y.o. male who presented to the  ED for evaluation of multiple seizures at home with concern for LPDs on outpatient EEG. Recently discharged from the ED on 2/4 after his first seizure with outpatient neurology follow up. Initial ED work up revealed a magnesium of 1.0 and ionized calcium of 0.86.   He had LP completed cultures were negative, HSV neg. Autoimmune and encephalitis panel neg.  Scheduled ativan stopped yesterday. His mental status should start improving. Do not want to reduce Seizure meds too quickly. Switch to oral meds today via NG tube and consult pharmacy to assist.   Recommendations: D/C LTM D/C acyclovir.  - Continue seizure precautions - Continue Briviact 176m BID  -Reduce phenytoin 75 mg BID  -Con't Vimpat 50 mg  twice daily.  Keep at a low dose due to creatinine. -On xanax for anxiety  - Neurology will continue to follow  Discussed plan with patient's wife who is a caretaker.   LOS: 6 days   GDorene Grebe Neuro NP Can be reached via Epic secure message 06/09/21 9:58 AM   Total of 35 mins spent reviewing chart, discussion with patient and family on prognosis, Dx and plan. Discussed case with patient's nurse. Reviewed Imaging personally.   Kaelei Wheeler,MD

## 2021-06-09 NOTE — Progress Notes (Deleted)
EEG complete - results pending.

## 2021-06-09 NOTE — Plan of Care (Signed)
Pt has been sleepy responsive to voice, pt is now alert oriented x 1, pt has been repositioned and cleaned. Tube feeding continued per order. Pt is tolerating feedings well. Pts wife present at bedside, no concerns verbalized.  Problem: Education: Goal: Knowledge of General Education information will improve Description: Including pain rating scale, medication(s)/side effects and non-pharmacologic comfort measures Outcome: Progressing   Problem: Health Behavior/Discharge Planning: Goal: Ability to manage health-related needs will improve Outcome: Progressing   Problem: Clinical Measurements: Goal: Ability to maintain clinical measurements within normal limits will improve Outcome: Progressing Goal: Will remain free from infection Outcome: Progressing Goal: Diagnostic test results will improve Outcome: Progressing Goal: Respiratory complications will improve Outcome: Progressing Goal: Cardiovascular complication will be avoided Outcome: Progressing   Problem: Activity: Goal: Risk for activity intolerance will decrease Outcome: Progressing   Problem: Nutrition: Goal: Adequate nutrition will be maintained Outcome: Progressing   Problem: Coping: Goal: Level of anxiety will decrease Outcome: Progressing   Problem: Elimination: Goal: Will not experience complications related to bowel motility Outcome: Progressing Goal: Will not experience complications related to urinary retention Outcome: Progressing   Problem: Pain Managment: Goal: General experience of comfort will improve Outcome: Progressing   Problem: Safety: Goal: Ability to remain free from injury will improve Outcome: Progressing   Problem: Skin Integrity: Goal: Risk for impaired skin integrity will decrease Outcome: Progressing

## 2021-06-09 NOTE — Progress Notes (Signed)
LTM EEG discontinued - no skin breakdown at unhook.  

## 2021-06-09 NOTE — Procedures (Signed)
EEG Procedure CPT/Type of Study: 94712; 24hr EEG with video Referring Provider: Vijaya Primary Neurological Diagnosis: status epilepticus  History: This is a 61 yr old patient, undergoing an EEG to evaluate for new onset seizures, status epilepticus. Clinical State: disoriented  Technical Description:  The EEG was performed using standard setting per the guidelines of American Clinical Neurophysiology Society (ACNS).  A minimum of 21 electrodes were placed on scalp according to the International 10-20 or/and 10-10 Systems. Supplemental electrodes were placed as needed. Single EKG electrode was also used to detect cardiac arrhythmia. Patient's behavior was continuously recorded on video simultaneously with EEG. A minimum of 16 channels were used for data display. Each epoch of study was reviewed manually daily and as needed using standard referential and bipolar montages. Computerized quantitative EEG analysis (such as compressed spectral array analysis, trending, automated spike & seizure detection) were used as indicated.   Day 6: from 0730 06/08/21 to 0730 06/09/21  EEG Description: Overall Amplitude:Normal Predominant Frequency: The background activity showed posterior dominant alpha, with about 9 Hz, that was occasional. Superimposed Frequencies: occasional theta and some beta activity over the left The background was asymmetric, decreased background over the right  Background Abnormalities: Focal slowing: right hemisphere focal delta-theta, improved Rhythmic or periodic pattern: Lateralized Periodic Discharges: right frontal LPDs, 0.5Hz, improved and only occurring rarely Epileptiform activity: Yes; rare LPDs Electrographic seizures: No Events: no   Breach rhythm: no  Reactivity: Present  Stimulation procedures:  Hyperventilation: not done Photic stimulation: not done  Sleep Background: Stage II  EKG:no significant arrhythmia  Impression: This was an abnormal continuous  video EEG due to only rarely-occurring right frontal LPDs. No seizures were seen.

## 2021-06-09 NOTE — Progress Notes (Signed)
Pharmacy Consult for Pulmonary Hypertension Treatment   Indication - PAH medication while hospitalized  Patient is 61 y.o. and will be continued on Macitentan (Opsumit).   In order to use this medication as an inpatient, monitoring parameters per REMS requirements must be met.  Chronic therapy is under the supervision of Dr. Haroldine Laws.  On admission pregnancy risk has been assessed and no monitoring required. Hepatic function has been evaluated. AST/ALT appropriate to continue medication at this time.  Hepatic Function Latest Ref Rng & Units 06/08/2021 06/07/2021 06/04/2021  Total Protein 6.5 - 8.1 g/dL 5.4(L) - 5.8(L)  Albumin 3.5 - 5.0 g/dL 1.8(L) 2.0(L) 2.2(L)  AST 15 - 41 U/L 20 - 32  ALT 0 - 44 U/L 15 - 21  Alk Phosphatase 38 - 126 U/L 66 - 77  Total Bilirubin 0.3 - 1.2 mg/dL 0.2(L) - 0.5  Bilirubin, Direct 0.0 - 0.2 mg/dL - - 0.1     If any question arise during hospitalization, contact for bosentan and macitentan: [phone]; ambrisentan: [phone].  Thank you for allowing pharmacy to be a part of this patients care.  Ardyth Harps, PharmD Clinical Pharmacist

## 2021-06-09 NOTE — Progress Notes (Signed)
Speech Language Pathology Treatment: Dysphagia  Patient Details Name: [name] MRN: [id number] DOB: Sep 07, 1960 Today's Date: 06/09/2021 Time: 4155-1614 SLP Time Calculation (min) (ACUTE ONLY): 17 min  Assessment / Plan / Recommendation Clinical Impression  Pt was seen for dysphagia treatment. His level of alertness was improved compared to when he was last seen, but he continues to exhibit difficulty maintaining an adequate level of alertness for prolonged periods. Oral care was completed and white coating noted on superior lingual surface; thrush suspected and Dr.McClung and Samer, RN advised of this. Pt tolerated puree and individual sips of thin liquids via cup without overt s/sx of aspiration. Pt exhibited multiple swallows and delayed throat clearing with consecutive swallows of thin liquids via straw, suggesting possible aspiration. As alertness progressively waned, pt required increased support to initiate bolus manipulation/swallowing and more oral holding was noted. Pt is demonstrating improvement in swallow function, but he still does not present as a candidate for p.o. intake of meals. It is recommended that pt's NPO status be maintained with allowance of ice chips following oral care. SLP will continue to follow pt.    HPI HPI: Pt is a 61 y/o male who presents with recurrent seizures. EEG 2/13: abnormal continuous video EEG due to right frontal LPDs with less overriding fast activity. LP conducted 2/12; cultures pending at time of eval. Dx Acute metabolic and toxic encephalopathy. PMH significant for CAD, CHF, HTN, CKD IV, Raynard's, GIB, recent history of seizures, hypothyroidism. BSE 02/20/21: dysphagia 3 & thin liquids without need for follow up.      SLP Plan  Continue with current plan of care      Recommendations for follow up therapy are one component of a multi-disciplinary discharge planning process, led by the attending physician.  Recommendations may be updated based  on patient status, additional functional criteria and insurance authorization.    Recommendations  Diet recommendations: NPO (ice chips allowed after oral care) Medication Administration: Via alternative means                Oral Care Recommendations: Oral care QID;Oral care prior to ice chip/H20 Follow Up Recommendations:  (TBD) SLP Visit Diagnosis: Dysphagia, unspecified (R13.10) Plan: Continue with current plan of care         Mertis Mosher I. Hardin Negus, [place], [place] Office number [phone] Pager [place]  06/09/2021, 9:25 AM

## 2021-06-09 NOTE — Progress Notes (Signed)
[name]  LKG:[id number] DOB: 1961-02-09 DOA: 06/03/2021 PCP: [name], MD    Brief Narrative:  61 year old with a history of CAD, CHF/PAH, HTN, CKD stage IV, Scleroderma, GIB, and a recent history of seizures who presented to the ED with recurrent seizure activity.  Consultants:  Neurology CHF Team   Code Status: FULL CODE  Antimicrobials:  None  DVT prophylaxis: Subcutaneous heparin  Interim Hx: Afebrile.  Vital signs stable.  Saturation 90% on room air.  Sodium remains elevated at 150.  Creatinine elevated 2.26.  Magnesium stable at 2.0.  No further seizures noted on follow-up testing. Pt responds to touch, but quickly falls asleep after.  Assessment & Plan:  Recurrent seizure activity Neurology following - has been difficult to manage, requiring multiple medication adjustments - appears to be making slow progress now - ultimate plan is for patient to go home with family  Acute toxic metabolic encephalopathy LP accomplished and patient initially empirically covered with antibiotics which have subsequently been stopped with negative cultures -testing for HSV negative so acyclovir discontinued  Severe hypomagnesemia Magnesium 1.0 at initial presentation -corrected with supplementation  Macrocytic anemia -anemia of chronic kidney disease B12 455 - hemoglobin slowly improving  Hypernatremia Increased free water 2/15 - monitor trend  Chronic diastolic CHF with acute exacerbation No volume overload appreciated on exam - CHF team following - diuretic on hold for now  Hypocalcemia Ionized calcium 0.86 at initial presentation - calcium stable  Pulmonary hypertension Appears well compensated presently  Severe protein calorie malnutrition Continue tube feed supplementation  HLD Continue medical therapy  HTN Blood pressure currently well controlled  Stage IV CKD Baseline creatinine approximately 2.7 -creatinine stable/better than baseline  presently  Tobacco abuse   Family Communication: spoke w/ wife at bedside  Disposition: From home - for d/c home when medically clear   Objective: Blood pressure (!) 147/84, pulse 82, temperature (!) 97.5 F (36.4 C), temperature source Oral, resp. rate 16, height 5' 9" (1.753 m), weight 49.9 kg, SpO2 100 %.  Intake/Output Summary (Last 24 hours) at 06/09/2021 1104 Last data filed at 06/09/2021 0400 Gross per 24 hour  Intake 925 ml  Output 50 ml  Net 875 ml    Filed Weights   06/03/21 1626 06/08/21 1523 06/08/21 1729  Weight: 52.2 kg 49.9 kg 49.9 kg    Examination: General: No acute respiratory distress Lungs: Clear to auscultation bilaterally  Cardiovascular: RRR Abdomen: NT/ND, soft, bs+ Extremities: No significant edema bilateral lower extremities  CBC: Recent Labs  Lab 06/06/21 0233 06/08/21 0435 06/09/21 0247  WBC 4.6 3.8* 4.0  NEUTROABS 3.1 2.4 2.3  HGB 8.7* 7.4* 8.5*  HCT 30.5* 24.8* 28.7*  MCV 103.7* 100.8* 102.1*  PLT 210 181 664    Basic Metabolic Panel: Recent Labs  Lab 06/06/21 0233 06/06/21 1803 06/07/21 1635 06/08/21 0435 06/08/21 1645 06/09/21 0247  NA 144  --   --  151*  --  150*  K 3.6  --   --  3.7  --  4.2  CL 107  --   --  114*  --  111  CO2 20*  --   --  28  --  28  GLUCOSE 76  --   --  94  --  105*  BUN 26*  --   --  16  --  20  CREATININE 2.65*  --   --  2.18*  --  2.26*  CALCIUM 7.4*  --   --  7.5*  --  7.8*  MG 1.7   < > 3.3* 2.6* 2.3 2.0  PHOS 4.5   < > 3.1 3.2 3.0  --    < > = values in this interval not displayed.    GFR: Estimated Creatinine Clearance: 23.9 mL/min (A) (by C-G formula based on SCr of 2.26 mg/dL (H)).  Liver Function Tests: Recent Labs  Lab 06/03/21 1627 06/04/21 0608 06/07/21 0444 06/08/21 0435  AST 44* 32  --  20  ALT 24 21  --  15  ALKPHOS 89 77  --  66  BILITOT 0.7 0.5  --  0.2*  PROT 6.1* 5.8*  --  5.4*  ALBUMIN 2.3* 2.2* 2.0* 1.8*    HbA1C: Hgb A1c MFr Bld  Date/Time Value Ref Range  Status  10/09/2019 04:50 AM 4.9 4.8 - 5.6 % Final    Comment:    (NOTE)         Prediabetes: 5.7 - 6.4         Diabetes: >6.4         Glycemic control for adults with diabetes: <7.0     CBG: Recent Labs  Lab 06/07/21 0844 06/07/21 1209 06/07/21 1641 06/07/21 2046 06/08/21 0739  GLUCAP 118* 116* 121* 150* 86    Scheduled Meds:  calcium carbonate  200 mg of elemental calcium Oral BID   chlorhexidine gluconate (MEDLINE KIT)  15 mL Mouth Rinse BID   feeding supplement (PROSource TF)  45 mL Per Tube Daily   free water  250 mL Per Tube Q4H   heparin  5,000 Units Subcutaneous Q8H   macitentan  10 mg Oral Daily   mouth rinse  15 mL Mouth Rinse 10 times per day   multivitamin with minerals  1 tablet Per Tube Daily   nicotine  21 mg Transdermal Daily   pantoprazole sodium  40 mg Per Tube Daily   phenytoin (DILANTIN) IV  75 mg Intravenous Q8H   rosuvastatin  20 mg Per Tube Daily   sildenafil  40 mg Per Tube TID   thiamine  100 mg Per Tube Daily   Continuous Infusions:  brivaracetam (BRIVIACT) IVPB 100 mg (06/09/21 1025)   feeding supplement (OSMOLITE 1.2 CAL) 1,000 mL (06/07/21 1259)   lacosamide (VIMPAT) IV 50 mg (06/09/21 0942)     LOS: 6 days   Cherene Altes, MD [facility] Office  [phone] Pager - Text Page per Shea Evans  If 7PM-7AM, please contact night-coverage per Amion 06/09/2021, 11:04 AM

## 2021-06-10 ENCOUNTER — Ambulatory Visit: Payer: 59 | Admitting: Neurology

## 2021-06-10 DIAGNOSIS — R569 Unspecified convulsions: Secondary | ICD-10-CM | POA: Diagnosis not present

## 2021-06-10 LAB — BASIC METABOLIC PANEL
Anion gap: 8 (ref 5–15)
BUN: 23 mg/dL (ref 8–23)
CO2: 32 mmol/L (ref 22–32)
Calcium: 7.9 mg/dL — ABNORMAL LOW (ref 8.9–10.3)
Chloride: 105 mmol/L (ref 98–111)
Creatinine, Ser: 2.11 mg/dL — ABNORMAL HIGH (ref 0.61–1.24)
GFR, Estimated: 35 mL/min — ABNORMAL LOW (ref >60)
Glucose, Bld: 95 mg/dL (ref 70–99)
Potassium: 4.6 mmol/L (ref 3.5–5.1)
Sodium: 145 mmol/L (ref 135–145)

## 2021-06-10 LAB — PHENYTOIN LEVEL, TOTAL: Phenytoin Lvl: 21 ug/mL — ABNORMAL HIGH (ref 10.0–20.0)

## 2021-06-10 LAB — ALBUMIN: Albumin: 2.1 g/dL — ABNORMAL LOW (ref 3.5–5.0)

## 2021-06-10 MED ORDER — OSMOLITE 1.2 CAL PO LIQD
1000.0000 mL | ORAL | Status: DC
  Administered 2021-06-10 – 2021-06-12 (×3): 1000 mL
  Filled 2021-06-10 (×2): qty 1000

## 2021-06-10 MED ORDER — HALOPERIDOL LACTATE 5 MG/ML IJ SOLN
1.0000 mg | Freq: Four times a day (QID) | INTRAMUSCULAR | Status: DC | PRN
Start: 2021-06-10 — End: 2021-06-10

## 2021-06-10 MED ORDER — PHENYTOIN 125 MG/5ML PO SUSP
50.0000 mg | Freq: Three times a day (TID) | ORAL | Status: DC
  Administered 2021-06-10 – 2021-06-12 (×6): 50 mg
  Filled 2021-06-10 (×8): qty 4

## 2021-06-10 MED ORDER — HALOPERIDOL LACTATE 5 MG/ML IJ SOLN: 2.0000 mg | Freq: Four times a day (QID) | INTRAMUSCULAR | Status: DC | PRN

## 2021-06-10 NOTE — Progress Notes (Signed)
Occupational Therapy Treatment Patient Details Name: [name] MRN: [id number] DOB: [birth year] Today's Date: 06/10/2021   History of present illness Pt is a 61 y/o male who presents with recurrent seizures. PMH significant for CAD, CHF, HTN, CKD IV, Raynard's, GIB, recent history of seizures, hypothyroidism.   OT comments  Patient received in bed and appeared lethargic. Family states he was up some last night and they sat him on EOB. Patient aroused and agreeable to OT/PT treatment. Patient able to get to EOB with assistance and was mod assist to [facility]. Patient performed mobility from EOB to door with RW and min to mod assist of 2 and required a seated rest break before returning to EOB with patient demonstrating poor safety and attempting to sit before reaching bed. Patient was assisted to supine and instructed in BUE grip strengthening exercises with squeeze ball due to patient demonstrating poor grip strength with LUE with RW use. Acute OT to continue to follow.    Recommendations for follow up therapy are one component of a multi-disciplinary discharge planning process, led by the attending physician.  Recommendations may be updated based on patient status, additional functional criteria and insurance authorization.    Follow Up Recommendations  Home health OT    Assistance Recommended at Discharge Frequent or constant Supervision/Assistance  Patient can return home with the following  A lot of help with walking and/or transfers;A lot of help with bathing/dressing/bathroom;Assistance with feeding;Direct supervision/assist for medications management;Assist for transportation;Help with stairs or ramp for entrance   Equipment Recommendations  BSC/3in1;Wheelchair (measurements OT);Wheelchair cushion (measurements OT);Hospital bed    Recommendations for Other Services      Precautions / Restrictions Precautions Precautions: Fall Precaution Comments: has had history of  agitation Restrictions Weight Bearing Restrictions: No       Mobility Bed Mobility Overal bed mobility: Needs Assistance Bed Mobility: Rolling, Supine to Sit, Sit to Supine Rolling: Min assist   Supine to sit: Min assist Sit to supine: Mod assist   General bed mobility comments: patient participated well with bed mobility requiring assistance with trunk and LEs    Transfers Overall transfer level: Needs assistance Equipment used: Rolling walker (2 wheels) Transfers: Sit to/from Stand, Bed to chair/wheelchair/BSC Sit to Stand: Min assist, Mod assist, +2 physical assistance     Step pivot transfers: Min assist, Mod assist, +2 physical assistance     General transfer comment: patient varied from min to mod assist of 2 for transfers due to fatigue`     Balance Overall balance assessment: Needs assistance Sitting-balance support: Feet supported, Bilateral upper extremity supported Sitting balance-Leahy Scale: Poor Sitting balance - Comments: min assist for sitting balance on EOB   Standing balance support: During functional activity, Bilateral upper extremity supported Standing balance-Leahy Scale: Poor Standing balance comment: reliant on BUE support for balance                           ADL either performed or assessed with clinical judgement   ADL Overall ADL's : Needs assistance/impaired                 Upper Body Dressing : Moderate assistance;Sitting Upper Body Dressing Details (indicate cue type and reason): donned gown                   General ADL Comments: donned gown seated on EOB    Extremity/Trunk Assessment  Vision       Perception     Praxis      Cognition Arousal/Alertness: Awake/alert, Lethargic Behavior During Therapy: Flat affect Overall Cognitive Status: Impaired/Different from baseline Area of Impairment: Orientation, Attention, Following commands, Memory, Safety/judgement, Awareness, Problem  solving                 Orientation Level: Disoriented to, Place, Person, Time, Situation Current Attention Level: Focused Memory: Decreased short-term memory Following Commands: Follows one step commands inconsistently, Follows one step commands with increased time Safety/Judgement: Decreased awareness of safety, Decreased awareness of deficits Awareness: Intellectual Problem Solving: Slow processing, Decreased initiation, Difficulty sequencing, Requires verbal cues, Requires tactile cues General Comments: lethargic initially but was able to arouse and attentive during treatment.        Exercises Exercises: Other exercises Other Exercises Other Exercises: provided patient with squeeze ball and instructed patient and family on exercises to increase BUE grip strength    Shoulder Instructions       General Comments      Pertinent Vitals/ Pain       Pain Assessment Pain Assessment: Faces Faces Pain Scale: No hurt Pain Intervention(s): Monitored during session  Home Living                                          Prior Functioning/Environment              Frequency  Min 2X/week        Progress Toward Goals  OT Goals(current goals can now be found in the care plan section)  Progress towards OT goals: Progressing toward goals  Acute Rehab OT Goals Patient Stated Goal: get back to prior level of function OT Goal Formulation: With patient/family Time For Goal Achievement: 06/19/21 Potential to Achieve Goals: Good ADL Goals Pt Will Perform Grooming: with modified independence;standing Pt Will Perform Upper Body Dressing: with modified independence;sitting Pt Will Perform Lower Body Dressing: with modified independence;sit to/from stand Pt Will Transfer to Toilet: with supervision;ambulating Pt/caregiver will Perform Home Exercise Program: Increased strength;Both right and left upper extremity;With written HEP provided Additional ADL Goal  #1: Pt will indep follow multi-step ADL task  Plan Discharge plan needs to be updated    Co-evaluation    PT/OT/SLP Co-Evaluation/Treatment: Yes Reason for Co-Treatment: For patient/therapist safety;To address functional/ADL transfers   OT goals addressed during session: ADL's and self-care      AM-PAC OT "6 Clicks" Daily Activity     Outcome Measure   Help from another person eating meals?: Total Help from another person taking care of personal grooming?: A Lot Help from another person toileting, which includes using toliet, bedpan, or urinal?: A Lot Help from another person bathing (including washing, rinsing, drying)?: A Lot Help from another person to put on and taking off regular upper body clothing?: A Lot Help from another person to put on and taking off regular lower body clothing?: A Lot 6 Click Score: 11    End of Session Equipment Utilized During Treatment: Rolling walker (2 wheels);Gait belt  OT Visit Diagnosis: Other abnormalities of gait and mobility (R26.89);Muscle weakness (generalized) (M62.81);Unsteadiness on feet (R26.81)   Activity Tolerance Patient tolerated treatment well;Patient limited by lethargy   Patient Left in bed;with call bell/phone within reach;with bed alarm set;with family/visitor present   Nurse Communication Mobility status        Time: 3419-6222  OT Time Calculation (min): 23 min  Charges: OT General Charges $OT Visit: 1 Visit OT Treatments $Self Care/Home Management : 8-22 mins  Lodema Hong, [place]  Pager [phone] Office [place] 06/10/2021, 12:47 PM

## 2021-06-10 NOTE — Progress Notes (Signed)
Subjective: Interval History:  Wife  at the bedside.  No seizures reported Reports that he was very agitated overnight.  Objective: Vital signs in last 24 hours: Temp:  [97.5 F (36.4 C)-98 F (36.7 C)] 97.5 F (36.4 C) (02/17 0744) Pulse Rate:  [84-95] 84 (02/17 0744) Resp:  [16-19] 16 (02/17 0347) BP: (115-142)/(79-91) 142/87 (02/17 0744) SpO2:  [93 %-100 %] 100 % (02/17 0744) General: Cachectic looking man, awake alert in no distress HEENT: Normocephalic atraumatic CVs: Regular rhythm Respiratory: Breathing well saturating normally on room air Neurological exam He is awake, alert, oriented to self. He is unable to tell me the correct age or month He is able to tell me his wife's name Speech is mildly dysarthric Cranial nerves: Pupils are equal round reactive to light, extraocular movements appear intact, no visual field restrictions, blinks to threat from both sides, question mild facial asymmetry with right facial weakness. Motor examination with antigravity all 4 extremities-this is different than yesterday. Decreased muscle bulk but normal tone. Sensation intact to touch  LABS CBC    Component Value Date/Time   WBC 4.0 06/09/2021 0247   RBC 2.81 (L) 06/09/2021 0247   HGB 8.5 (L) 06/09/2021 0247   HGB 10.5 (L) 06/11/2018 0852   HCT 28.7 (L) 06/09/2021 0247   HCT 33.1 (L) 06/11/2018 0852   PLT 179 06/09/2021 0247   PLT 222 06/11/2018 0852   MCV 102.1 (H) 06/09/2021 0247   MCV 93 06/11/2018 0852   MCH 30.2 06/09/2021 0247   MCHC 29.6 (L) 06/09/2021 0247   RDW 18.1 (H) 06/09/2021 0247   RDW 14.4 06/11/2018 0852   LYMPHSABS 1.2 06/09/2021 0247   MONOABS 0.4 06/09/2021 0247   EOSABS 0.2 06/09/2021 0247   BASOSABS 0.0 06/09/2021 0247   BMP Latest Ref Rng & Units 06/09/2021 06/08/2021 06/06/2021  Glucose 70 - 99 mg/dL 105(H) 94 76  BUN 8 - 23 mg/dL 20 16 26(H)  Creatinine 0.61 - 1.24 mg/dL 2.26(H) 2.18(H) 2.65(H)  BUN/Creat Ratio 6 - 22 (calc) - - -  Sodium 135 -  145 mmol/L 150(H) 151(H) 144  Potassium 3.5 - 5.1 mmol/L 4.2 3.7 3.6  Chloride 98 - 111 mmol/L 111 114(H) 107  CO2 22 - 32 mmol/L 28 28 20(L)  Calcium 8.9 - 10.3 mg/dL 7.8(L) 7.5(L) 7.4(L)   MEDS  Current Facility-Administered Medications:    ALPRAZolam (XANAX) tablet 0.25 mg, 0.25 mg, Per Tube, BID PRN, Hosie Poisson, MD   brivaracetam (BRIVIACT) 100 mg in sodium chloride 0.9 % 50 mL, 100 mg, Intravenous, Q12H, Mignon Pine, RPH, Last Rate: 240 mL/hr at 06/09/21 2158, 100 mg at 06/09/21 2158   calcium carbonate (TUMS - dosed in mg elemental calcium) chewable tablet 200 mg of elemental calcium, 200 mg of elemental calcium, Oral, BID, Tu, Ching T, DO, 200 mg of elemental calcium at 06/09/21 2124   chlorhexidine gluconate (MEDLINE KIT) (PERIDEX) 0.12 % solution 15 mL, 15 mL, Mouth Rinse, BID, Tu, Ching T, DO, 15 mL at 06/09/21 2129   feeding supplement (OSMOLITE 1.2 CAL) liquid 1,000 mL, 1,000 mL, Per Tube, Continuous, Akula, Vijaya, MD, Last Rate: 65 mL/hr at 06/07/21 1259, 1,000 mL at 06/07/21 1259   feeding supplement (PROSource TF) liquid 45 mL, 45 mL, Per Tube, Daily, Hosie Poisson, MD, 45 mL at 06/09/21 0944   fluconazole (DIFLUCAN) 40 MG/ML suspension 100 mg, 100 mg, Per Tube, Daily, Joette Catching T, MD, 100 mg at 06/09/21 1514   free water 250 mL, 250 mL,  Per Tube, Q4H, Joette Catching T, MD, 250 mL at 06/10/21 0400   heparin injection 5,000 Units, 5,000 Units, Subcutaneous, Q8H, Greta Doom, MD, 5,000 Units at 06/10/21 1194   lacosamide (VIMPAT) tablet 50 mg, 50 mg, Per Tube, BID, Cherene Altes, MD, 50 mg at 06/09/21 2125   macitentan (OPSUMIT) tablet 10 mg, 10 mg, Oral, Daily, Tu, Ching T, DO, 10 mg at 06/09/21 0945   MEDLINE mouth rinse, 15 mL, Mouth Rinse, 10 times per day, Tu, Ching T, DO, 15 mL at 06/10/21 1740   multivitamin with minerals tablet 1 tablet, 1 tablet, Per Tube, Daily, Hosie Poisson, MD, 1 tablet at 06/09/21 0945   nicotine (NICODERM CQ -  dosed in mg/24 hours) patch 21 mg, 21 mg, Transdermal, Daily, Hosie Poisson, MD, 21 mg at 06/09/21 0944   pantoprazole sodium (PROTONIX) 40 mg/20 mL oral suspension 40 mg, 40 mg, Per Tube, Daily, Hosie Poisson, MD, 40 mg at 06/09/21 0945   phenytoin (DILANTIN) 125 MG/5ML suspension 75 mg, 75 mg, Per Tube, TID, Cherene Altes, MD, 75 mg at 06/09/21 2125   rosuvastatin (CRESTOR) tablet 20 mg, 20 mg, Per Tube, Daily, Hosie Poisson, MD, 20 mg at 06/09/21 0945   sildenafil (REVATIO) tablet 40 mg, 40 mg, Per Tube, TID, Hosie Poisson, MD, 40 mg at 06/09/21 2125   thiamine tablet 100 mg, 100 mg, Per Tube, Daily, Hosie Poisson, MD, 100 mg at 06/09/21 0945  MRI brain 2/11: - Limited and motion degraded examination. Redemonstration of increased T2 signal affecting the right mesial temporal lobe, hippocampus and right mesial thalamus, presumed to be postictal change. I do not see low signal on the ADC map to suggest actual brain infarction. - Question developmental venous anomaly in the right basal ganglia and radiating white matter tracts.   Overnight EEG  2/11 - 2/12:"This was an abnormal continuous video EEG due to right frontal LPD-plus activity without any further ictal evolution."  2/12 - 2/13:This was an abnormal continuous video EEG due to right frontal LPDs with less overriding fast activity in the evening. No ictal evolution was seen.   2/13 -2/14:  improved right frontal LPDs. No seizures were seen. Morphology of the discharges was improved with increased medication treatment.   2/14-2/15: Impression: This was an abnormal continuous video EEG due to improved right frontal LPDs. No seizures were seen. The discharges were only present in sleep.   2/15-2/16 LTM: Impression: This was an abnormal continuous video EEG due to only rarely-occurring right frontal LPDs. No seizures were seen.   Assessment: 61 y.o. male who presented to the ED for evaluation of multiple seizures at home with  concern for LPDs on outpatient EEG. Recently discharged from the ED on 2/4 after his first seizure with outpatient neurology follow up. Initial ED work up revealed a magnesium of 1.0 and ionized calcium of 0.86.  He had LP completed cultures were negative, HSV neg. Autoimmune and encephalitis panel neg. Scheduled ativan stopped 2 days ago. His mental status and neurolgocial exam is improving.  Keep on current AEDs for now. Any changes to antiepileptic medications should be only considered as an outpatient if he remains stable and repeat imaging does not show any mesial temporal abnormalities.  Recommendations: -Acyclovir and LTM discontinued yesterday. - Continue seizure precautions - Continue Briviact 177m BID  -Reduce phenytoin 75 mg BID  -Con't Vimpat 50 mg  twice daily.  Keep at a low dose due to creatinine. -On xanax for anxiety  -Change the  above medications to p.o. once able to swallow-Briviact, phenytoin and Vimpat all have a one-to-one conversion p.o. to IV. -Correction of electrolyte derangements per primary team as you are. -Request SLP to evaluate again to see if he is able to take medication safely.  Continue parenteral antiepileptics until he is okay to swallow.  Plan discussed with Dr. Thereasa Solo patient's primary hospitalist. Please call neurology with questions as needed.  -- Amie Portland, MD Neurologist [facility] Pager: [phone]

## 2021-06-10 NOTE — Progress Notes (Signed)
[name]  QAS:[id number] DOB: [birth year] DOA: 06/03/2021 PCP: [name], MD    Brief Narrative:  61 year old with a history of CAD, CHF/PAH, HTN, CKD stage IV, Scleroderma, GIB, and a recent history of seizures who presented to the ED with recurrent seizure activity.  Consultants:  Neurology CHF Team   Code Status: FULL CODE  Antimicrobials:  None  DVT prophylaxis: Subcutaneous heparin  Interim Hx: Afebrile.  Vital signs remain stable.  Sodium continues to improve.  Creatinine improving.  Experiencing intermittent episodes of severe agitation and impulsiveness mixed with heavy sedation/somnolence.  Assessment & Plan:  Recurrent seizure activity Neurology has completed their evaluation- has been difficult to manage, requiring multiple medication adjustments - appears to be making slow progress now - ultimate plan is for patient to go home with family  Acute toxic metabolic encephalopathy LP accomplished and patient initially empirically covered with antibiotics which have subsequently been stopped with negative cultures -testing for HSV negative so acyclovir discontinued -mental status slowly improving but episodes of agitation are currently challenging  Severe hypomagnesemia Magnesium 1.0 at initial presentation -corrected with supplementation  Macrocytic anemia -anemia of chronic kidney disease B12 455 - hemoglobin slowly improving  Hypernatremia Increased free water 2/15 -improving significantly at this time  Chronic diastolic CHF with acute exacerbation No volume overload appreciated on exam - CHF team has evaluated- diuretic on hold for now  Hypocalcemia Ionized calcium 0.86 at initial presentation - calcium stable  Pulmonary hypertension Appears well compensated presently  Severe protein calorie malnutrition Continue tube feed supplementation  HLD Continue medical therapy  HTN Blood pressure currently well controlled  Stage IV  CKD Baseline creatinine approximately 2.7 -creatinine stable/better than baseline presently  Tobacco abuse   Family Communication: spoke w/ wife at bedside  Disposition: From home - for d/c home when medically clear   Objective: Blood pressure (!) 142/87, pulse 84, temperature (!) 97.5 F (36.4 C), temperature source Oral, resp. rate 16, height 5' 9" (1.753 m), weight 49.9 kg, SpO2 100 %.  Intake/Output Summary (Last 24 hours) at 06/10/2021 1113 Last data filed at 06/10/2021 0600 Gross per 24 hour  Intake 1185 ml  Output 240 ml  Net 945 ml    Filed Weights   06/03/21 1626 06/08/21 1523 06/08/21 1729  Weight: 52.2 kg 49.9 kg 49.9 kg    Examination: General: No acute respiratory distress Lungs: Clear to auscultation bilaterally without wheezing Cardiovascular: RRR without murmur  Abdomen: NT/ND, soft, bs+ Extremities: No significant edema bilateral lower extremities  CBC: Recent Labs  Lab 06/06/21 0233 06/08/21 0435 06/09/21 0247  WBC 4.6 3.8* 4.0  NEUTROABS 3.1 2.4 2.3  HGB 8.7* 7.4* 8.5*  HCT 30.5* 24.8* 28.7*  MCV 103.7* 100.8* 102.1*  PLT 210 181 798    Basic Metabolic Panel: Recent Labs  Lab 06/07/21 1635 06/08/21 0435 06/08/21 1645 06/09/21 0247 06/10/21 0908  NA  --  151*  --  150* 145  K  --  3.7  --  4.2 4.6  CL  --  114*  --  111 105  CO2  --  28  --  28 32  GLUCOSE  --  94  --  105* 95  BUN  --  16  --  20 23  CREATININE  --  2.18*  --  2.26* 2.11*  CALCIUM  --  7.5*  --  7.8* 7.9*  MG 3.3* 2.6* 2.3 2.0  --   PHOS 3.1 3.2 3.0  --   --  GFR: Estimated Creatinine Clearance: 25.6 mL/min (A) (by C-G formula based on SCr of 2.11 mg/dL (H)).  Liver Function Tests: Recent Labs  Lab 06/03/21 1627 06/04/21 0608 06/07/21 0444 06/08/21 0435  AST 44* 32  --  20  ALT 24 21  --  15  ALKPHOS 89 77  --  66  BILITOT 0.7 0.5  --  0.2*  PROT 6.1* 5.8*  --  5.4*  ALBUMIN 2.3* 2.2* 2.0* 1.8*    HbA1C: Hgb A1c MFr Bld  Date/Time Value Ref  Range Status  10/09/2019 04:50 AM 4.9 4.8 - 5.6 % Final    Comment:    (NOTE)         Prediabetes: 5.7 - 6.4         Diabetes: >6.4         Glycemic control for adults with diabetes: <7.0     CBG: Recent Labs  Lab 06/07/21 0844 06/07/21 1209 06/07/21 1641 06/07/21 2046 06/08/21 0739  GLUCAP 118* 116* 121* 150* 86    Scheduled Meds:  calcium carbonate  200 mg of elemental calcium Oral BID   chlorhexidine gluconate (MEDLINE KIT)  15 mL Mouth Rinse BID   feeding supplement (PROSource TF)  45 mL Per Tube Daily   fluconazole  100 mg Per Tube Daily   free water  250 mL Per Tube Q4H   heparin  5,000 Units Subcutaneous Q8H   lacosamide  50 mg Per Tube BID   macitentan  10 mg Oral Daily   mouth rinse  15 mL Mouth Rinse 10 times per day   multivitamin with minerals  1 tablet Per Tube Daily   nicotine  21 mg Transdermal Daily   pantoprazole sodium  40 mg Per Tube Daily   phenytoin  75 mg Per Tube TID   rosuvastatin  20 mg Per Tube Daily   sildenafil  40 mg Per Tube TID   thiamine  100 mg Per Tube Daily   Continuous Infusions:  brivaracetam (BRIVIACT) IVPB 100 mg (06/10/21 1105)   feeding supplement (OSMOLITE 1.2 CAL) 1,000 mL (06/07/21 1259)     LOS: 7 days   Cherene Altes, MD [facility] Office  [phone] Pager - Text Page per Amion  If 7PM-7AM, please contact night-coverage per Amion 06/10/2021, 11:13 AM

## 2021-06-10 NOTE — Progress Notes (Signed)
Patient continues to be agitated and noncompliant with commands and safety. Wife at bedside and is cannot redirect patient. He is alert to self and spouse, unaware of time, place and situation. He is wearing mitts to prevent removal of the CoreTrak and IV. He is turning self in bed and throwing legs over rails attempting to climb out. He refuses to lay and calm down. Alprazolam ineffective. MD notified as wife is requesting medication to assist with patient's agitation

## 2021-06-10 NOTE — Progress Notes (Signed)
Phenytoin Follow-Up Consult Indication: new onset refractory seizures  Allergies  Allergen Reactions   Oxycodone Other (See Comments)    Hallucinations    Patient Measurements: Height: _0  (175.3 cm) Weight: 49.9 kg (110 lb) IBW/kg (Calculated) : 70.7 TPN AdjBW (KG): 52.2 Body mass index is 16.24 kg/m.   Vital signs: Temp: 97.5 F (36.4 C) (02/17 0744) Temp Source: Oral (02/17 0744) BP: 142/87 (02/17 0744) Pulse Rate: 84 (02/17 0744)  Labs: Lab Results  Component Value Date/Time   Albumin 2.1 (L) 06/10/2021 0908   Phenytoin Lvl 21.0 (H) 06/10/2021 0908   Lab Results  Component Value Date   PHENYTOIN 21.0 (H) 06/10/2021   Estimated Creatinine Clearance: 25.6 mL/min (A) (by C-G formula based on SCr of 2.11 mg/dL (H)).    Assessment: Corrected phenytoin level (if needed): 40.38 (PHT level 21, ALB 2.1) Significant potential drug interactions:  - Induction of CYP3A4 by phenytoin may increase the metabolic elimination of Macitentan - Brivaracetam may increase the serum concentration of Phenytoin - Fluconazole may increase the serum concentration of Phenytoin  Phenytoin concentrations were reportedly an average of 20% higher with concurrent use of phenytoin and brivaracetam (400 mg/day).   New seizure activity on 2/04. 2 weeks prior was admitted/treated for C diff and metabolic acidosis. He had a negative CT head and lab work notable for corrected calcium of 7.1. Was discharged with calcium supplement and neurology follow up. Has since had additional seizure type activity, saw outpatient neurology 2/07, prescribed Briviact, not yet started d/t unavailability. On 2/10, had 1 hr EEG and following this had a tonic clonic seizure, EMS called and he had another on the way to hospital. New trush reported 2/16, low dose fluconazole started (anticipated interaction with phenytoin)    2/10:  - Keppra 2 grams IV x 1 - coCa 7.9 >> Cagluc 2 g IV x 1 - Mag 1 >> Mag IV 2 g x 1 2/11:   - Fosphenytoin LD 1044 mg x 1 - Phenytoin 100 mg Q8h - Briviact 50 mg IV x 1 2/12:  - Briviact 100 mg IV Q12h - Ativan 2 mg IV Q8h 2/13:  - Calcium 7.4 (coCa 8.8) - Vimpat 50 mg IV every 12 hours 2/15: - Phenytoin 61m Q8h  2/17: - Phenytoin 544mQ8h - CoCa 9.4   Goals of care:  Total phenytoin level: 10-20 mcg/ml Free phenytoin level: 1-2 mcg/ml   Plan:  Decrease phenytoin to 50 mg PO Q8H Recheck total phenytoin level once at steady state Pharmacy will continue to follow regarding obtaining total phenytoin levels and dose adjustments as indicated.   Thank you for allowing pharmacy to be a part of this patients care.  AdArdyth HarpsPharmD Clinical Pharmacist

## 2021-06-10 NOTE — Progress Notes (Signed)
Speech Language Pathology Treatment: Dysphagia  Patient Details Name: [name] MRN: [id number] DOB: November 15, 1960 Today's Date: 06/10/2021 Time: 1244-1300 SLP Time Calculation (min) (ACUTE ONLY): 16 min  Assessment / Plan / Recommendation Clinical Impression  Pt was seen for dysphagia treatment with his wife present. Pt's level of alertness was improved compared to yesterday and he communicated verbally. Pt tolerated puree solids and thin liquids via straw using consecutive swallows without overt s/sx of aspiration. Mastication and bolus manipulation were moderately prolonged with dysphagia 3 solids and pt exhibited coughing and mild oral residue with dysphagia 2. Despite the length of mastication and bolus manipulation, some pieces remained unmasticated and were ultimately removed from the oral cavity. Still, pt has demonstrated improvement in swallow function compared to that noted on 2/16. A dysphagia 1 diet with thin liquids is recommended at this time and SLP will continue to follow pt.     HPI HPI: Pt is a 61 y/o male who presents with recurrent seizures. EEG 2/13: abnormal continuous video EEG due to right frontal LPDs with less overriding fast activity. LP conducted 2/12; cultures pending at time of eval. Dx Acute metabolic and toxic encephalopathy. PMH significant for CAD, CHF, HTN, CKD IV, Raynard's, GIB, recent history of seizures, hypothyroidism. BSE 02/20/21: dysphagia 3 & thin liquids without need for follow up.      SLP Plan  Continue with current plan of care      Recommendations for follow up therapy are one component of a multi-disciplinary discharge planning process, led by the attending physician.  Recommendations may be updated based on patient status, additional functional criteria and insurance authorization.    Recommendations  Medication Administration: Via alternative means                Oral Care Recommendations: Oral care QID;Oral care prior to ice  chip/H20 Follow Up Recommendations:  (TBD) SLP Visit Diagnosis: Dysphagia, unspecified (R13.10) Plan: Continue with current plan of care         Kiree Dejarnette I. Hardin Negus, [place], [place] Office number [phone] Pager [place]  06/10/2021, 2:26 PM

## 2021-06-10 NOTE — TOC Initial Note (Signed)
Transition of Care [facility]) - Initial/Assessment Note    Patient Details  Name: [name] MRN: [id number] Date of Birth: [birth year]  Transition of Care [facility]) CM/SW Contact:    Pollie Friar, RN Phone Number: 06/10/2021, 11:36 AM  Clinical Narrative:                 CM met with the patient and his family at the bedside (pt slept through visit). Wife is interested in having [facility] provide the needed home health services at discharge.  She is unsure of DME needs at this time. She wants to see how he does the next couple of days and determine what he will need closer to discharge. Cm will leave message for weekend CM to follow.  Wife is able to provide needed transportation. She is also able to monitor his medications at home. TOC following.  Expected Discharge Plan: [place] Barriers to Discharge: Continued Medical Work up   Patient Goals and CMS Choice   CMS Medicare.gov Compare Post Acute Care list provided to:: Patient Represenative (must comment) Choice offered to / list presented to : Spouse  Expected Discharge Plan and Services Expected Discharge Plan: [place]   Discharge Planning Services: CM Consult Post Acute Care Choice: Home Health, Durable Medical Equipment                             HH Arranged: PT, OT          Prior Living Arrangements/Services   Lives with:: Spouse Patient language and need for interpreter reviewed:: Yes Do you feel safe going back to the place where you live?: Yes        Care giver support system in place?: Yes (comment) Current home services: DME (shower seat) Criminal Activity/Legal Involvement Pertinent to Current Situation/Hospitalization: No - Comment as needed  Activities of Daily Living Home Assistive Devices/Equipment: None ADL Screening (condition at time of admission) Patient's cognitive ability adequate to safely complete daily activities?: No Is the patient deaf or have  difficulty hearing?: No Does the patient have difficulty seeing, even when wearing glasses/contacts?: No Does the patient have difficulty concentrating, remembering, or making decisions?: Yes Patient able to express need for assistance with ADLs?: No Does the patient have difficulty dressing or bathing?: Yes Independently performs ADLs?: No Does the patient have difficulty walking or climbing stairs?: Yes Weakness of Legs: Both Weakness of Arms/Hands: None  Permission Sought/Granted                  Emotional Assessment Appearance:: Appears stated age         Psych Involvement: No (comment)  Admission diagnosis:  Seizure ([place]) [R56.9] Patient Active Problem List   Diagnosis Date Noted   Acute on chronic diastolic CHF (congestive heart failure) ([place]) 06/04/2021   Pressure injury of skin 06/04/2021   Seizure ([place]) 06/03/2021   Hypocalcemia 06/03/2021   Hypomagnesemia 06/03/2021   Hypokalemia 64/33/2951   Metabolic acidosis with normal anion gap and bicarbonate losses 88/41/6606   Metabolic acidosis with increased anion gap and accumulation of organic acids 05/17/2021   Diarrhea 05/17/2021   HCAP (healthcare-associated pneumonia) 02/20/2021   Acute blood loss anemia    Benign neoplasm of sigmoid colon    Acute lower GI bleeding 02/17/2021   CAD (coronary artery disease)    (HFpEF) heart failure with preserved ejection fraction (HCC)    CKD (chronic kidney  disease), stage IV ([place])    Sepsis ([place]) 11/07/2019   Pleural effusion 11/07/2019   Sepsis associated hypotension (HCC) 11/07/2019   Intractable vomiting    Abnormal CT scan, small bowel    SBO (small bowel obstruction) ([place]) 09/28/2019   History of colonic polyps 09/25/2019   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   Spitting blood 09/25/2019   Protein-calorie malnutrition, severe 02/11/2019   S/P pericardial surgery 02/10/2019   GIB (gastrointestinal bleeding) 09/14/2018   Arteriovenous  malformation of gastrointestinal tract    Gastritis and gastroduodenitis    Adenomatous polyp of transverse colon    Anemia 08/29/2018   Symptomatic anemia 08/28/2018   Acute renal failure superimposed on stage 4 chronic kidney disease ([place]) 08/28/2018   GI bleed 99/37/1696   Chronic systolic (congestive) heart failure (HCC)    Melena    Pulmonary hypertension ([place]) 06/18/2018   Left ventricular dysfunction 06/18/2018   Raynaud's syndrome without gangrene 05/24/2016   Acute CHF (congestive heart failure) ([place]) 02/24/2016   Essential hypertension 02/24/2016   Pericardial effusion 05/01/2014   Hyperlipidemia 10/18/2012   Hypertension 05/14/2012   CAD S/P percutaneous coronary angioplasty 04/26/2012   Gout 04/26/2012   Vitiligo    Smoker 06/16/2011   Chronic renal insufficiency, stage 3 (moderate) ([place]) 06/16/2011   Asthma, 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   Scleroderma ([place]) 06/15/2011   PCP:  [name], MD Pharmacy:   [id number] [place] [address] [address]Phone: [phone]: [phone] [place]Phone: [phone]: [phone] [facility] [address]Phone: [phone]: [phone]    Social Determinants of Health (SDOH) Interventions    Readmission Risk Interventions Readmission Risk Prevention Plan 05/23/2021 11/10/2019 02/11/2019  Transportation Screening Complete Complete Complete  PCP or Specialist Appt within 3-5 Days Complete Complete Complete  HRI or Home Care Consult Complete Complete Complete  Social Work Consult for [place]/Counseling Complete Complete Complete  Palliative Care Screening Not Applicable Not Applicable Not Applicable  Medication Review (Press photographer Complete Complete Complete  Some recent data might be  hidden

## 2021-06-10 NOTE — Plan of Care (Signed)
Pt is alert oriented x 1, pt sitting up speaking with his family. Pt wife sat pt on the side of the bed. Pt has become increasingly more awake and interactive. Tube feeding continued per order. No distress noted.   Problem: Education: Goal: Knowledge of General Education information will improve Description: Including pain rating scale, medication(s)/side effects and non-pharmacologic comfort measures Outcome: Progressing   Problem: Health Behavior/Discharge Planning: Goal: Ability to manage health-related needs will improve Outcome: Progressing   Problem: Clinical Measurements: Goal: Ability to maintain clinical measurements within normal limits will improve Outcome: Progressing Goal: Will remain free from infection Outcome: Progressing Goal: Diagnostic test results will improve Outcome: Progressing Goal: Respiratory complications will improve Outcome: Progressing Goal: Cardiovascular complication will be avoided Outcome: Progressing   Problem: Activity: Goal: Risk for activity intolerance will decrease Outcome: Progressing   Problem: Nutrition: Goal: Adequate nutrition will be maintained Outcome: Progressing   Problem: Coping: Goal: Level of anxiety will decrease Outcome: Progressing   Problem: Elimination: Goal: Will not experience complications related to bowel motility Outcome: Progressing Goal: Will not experience complications related to urinary retention Outcome: Progressing   Problem: Pain Managment: Goal: General experience of comfort will improve Outcome: Progressing   Problem: Safety: Goal: Ability to remain free from injury will improve Outcome: Progressing   Problem: Skin Integrity: Goal: Risk for impaired skin integrity will decrease Outcome: Progressing

## 2021-06-10 NOTE — Progress Notes (Signed)
Physical Therapy Treatment Patient Details Name: [name] MRN: [id number] DOB: Dec 15, 1960 Today's Date: 06/10/2021   History of Present Illness Pt is a 61 y/o male who presents with recurrent seizures. PMH significant for CAD, CHF, HTN, CKD IV, Raynard's, GIB, recent history of seizures, hypothyroidism.  PT Comments    Pt admitted with above diagnosis. Pt was able to ambulate a short distance with PT/OT requiring +2 mod to max assist with instability noted bil LEs. Pt with flexed posture and attempts to sit prematurely during ambulation. Pt with poor safety awareness. Wife states the meds have him dizzy and he is "not himself".  Will continue to follow pt and progress pt as able.  Pt currently with functional limitations due to balance and endurance deficits. Pt will benefit from skilled PT to increase their independence and safety with mobility to allow discharge to the venue listed below.      Recommendations for follow up therapy are one component of a multi-disciplinary discharge planning process, led by the attending physician.  Recommendations may be updated based on patient status, additional functional criteria and insurance authorization.  Follow Up Recommendations  Home health PT (family reports "we are taking him home")     Assistance Recommended at Discharge Frequent or constant Supervision/Assistance  Patient can return home with the following Two people to help with walking and/or transfers;Assist for transportation;Help with stairs or ramp for entrance   Equipment Recommendations  Hospital bed;Rolling walker (2 wheels);Wheelchair (measurements PT);Wheelchair cushion (measurements PT);BSC/3in1    Recommendations for Other Services       Precautions / Restrictions Precautions Precautions: Fall  (Precaution Comments: has had history of agitation Restrictions Weight Bearing Restrictions: No    Mobility  Bed Mobility Overal bed mobility: Needs Assistance  Bed  Mobility: Rolling, Sidelying to Sit  Rolling: Min assistSidelying to sit: Min assist, HOB elevated   Sit to supine: Min assist (General bed mobility comments: attempting to assist for rolling, reached for rail and was able to sit up with min assist. Min assist to scoot to EOB. Pt dizzy initially and needed min assist to sit EOB  Transfers Overall transfer level: Needs assistance  Equipment used: Rolling walker (2 wheels) (Simultaneous filing. User may not have seen previous data.) Transfers: Sit to/from Stand Sit to Stand: Min assist, Mod assist, +2 physical assistance         General transfer comment: Pt needed mod assist to power up and steady as pt unsteady intiially. Pt pushed up wtih his left hand on bed but pulled up on RW with his right hand although cued differently.  Ambulation/Gait Ambulation/Gait assistance: Max assist, Mod assist, +2 physical assistance Gait Distance (Feet): 70 Feet (35 feet x 2) Assistive device: Rolling walker (2 wheels) Gait Pattern/deviations: Decreased step length - right, Decreased step length - left, Decreased weight shift to right, Decreased weight shift to left, Knee flexed in stance - right, Knee flexed in stance - left, Knees buckling, Leaning posteriorly, Staggering left, Staggering right, Drifts right/left, Trunk flexed   Gait velocity interpretation: <1.31 ft/sec, indicative of household ambulator   General Gait Details: Pt maintaining flexed hips and knees much of session.  Intially needed more assist to steer RW and stay close to the RW. As he fatigued, he flexed at hips and knees as well as trunk so much so that PT/OT had to bring a chair to have him sit down as he was almost in a squat when trying to turn to head back to  bed.  Pt rested and wanted to walk back to bed however after taking a few steps pt began to flex again and was in a squat for the last several steps to bed requiring max assist of 2 to safely get pt back to bed. Wife present and  states that pt is very different and that he walks much better at home without assist.   Stairs             Wheelchair Mobility    Modified Rankin (Stroke Patients Only)       Balance Overall balance assessment: Needs assistance  Sitting-balance support: Feet supported, Bilateral upper extremity supported, No upper extremity supported  Sitting balance-Leahy Scale: Poor  Sitting balance - Comments: Anterior lean, poor recovery of truncal balance with position changes to sitting. Middle to end of session, pt was needing assist to sit in chair and on bed due to severe anterior lean. (  Standing balance support: During functional activity, Bilateral upper extremity supported Standing balance-Leahy Scale: Poor  Standing balance comment: +2 required with pt relying on UE support and external support of 2 persons up to max assist.                          Cognition Arousal/Alertness: Awake/alert, Lethargic  Behavior During Therapy: Flat affect, Impulsive, Restless  Overall Cognitive Status: Impaired/Different from baseline  Area of Impairment: Orientation, Attention, Following commands, Memory, Safety/judgement, Awareness, Problem solving             Orientation Level: Disoriented to, Place, Person, Time, Situation  Current Attention Level: Focused  Memory: Decreased short-term memory  Following Commands: Follows one step commands inconsistently, Follows one step commands with increased time  Safety/Judgement: Decreased awareness of safety, Decreased awareness of deficits  Awareness: Intellectual Problem Solving: Slow processing, Decreased initiation, Difficulty sequencing, Requires verbal cues, Requires tactile cues  General Comments: Lethargic at times especially toward end of session.      Exercises General Exercises - Lower Extremity Ankle Circles/Pumps: AROM, Both, 10 reps, Supine Quad Sets: AROM, Both, 10 reps, Supine Gluteal Sets: AROM, Both, 10 reps,  Supine Heel Slides: AROM, Both, 10 reps, Supine    General Comments General comments (skin integrity, edema, etc.): Wife present, VSS, O2 99% on RA.  Replaced O2 at 1 L as pt had on on arrival to room      Pertinent Vitals/Pain Pain Assessment Pain Assessment: No/denies pain  Faces Pain Scale: No hurt  Breathing: normal Negative Vocalization: none Facial Expression: smiling or inexpressive Body Language: relaxed Consolability: no need to console PAINAD Score: 0    Home Living                          Prior Function            PT Goals (current goals can now be found in the care plan section) Progress towards PT goals: Progressing toward goals    Frequency    Min 3X/week      PT Plan Current plan remains appropriate    Co-evaluation PT/OT/SLP Co-Evaluation/Treatment: Yes Reason for Co-Treatment: Necessary to address cognition/behavior during functional activity;For patient/therapist safety;Complexity of the patient's impairments (multi-system involvement)  PT goals addressed during session: Mobility/safety with mobility OT goals addressed during session: ADL's and self-care      AM-PAC PT "6 Clicks" Mobility   Outcome Measure  Help needed turning from your back to your side while  in a flat bed without using bedrails?: A Lot Help needed moving from lying on your back to sitting on the side of a flat bed without using bedrails?: A Lot Help needed moving to and from a bed to a chair (including a wheelchair)?: Total Help needed standing up from a chair using your arms (e.g., wheelchair or bedside chair)?: Total Help needed to walk in hospital room?: Total Help needed climbing 3-5 steps with a railing? : Total 6 Click Score: 8    End of Session Equipment Utilized During Treatment: Gait belt;Oxygen Activity Tolerance: Patient tolerated treatment well Patient left: in bed;with call bell/phone within reach;with family/visitor present Nurse Communication:  Mobility status PT Visit Diagnosis: Other symptoms and signs involving the nervous system (R29.898)     Time: 4174-0814 PT Time Calculation (min) (ACUTE ONLY): 23 min  Charges:  $Gait Training: 8-22 mins                     Morrison Masser M,PT Acute Rehab Services [phone] (pager)    Alvira Philips 06/10/2021, 1:03 PM

## 2021-06-10 NOTE — Progress Notes (Signed)
Patient being followed by SLP, MD Rory Percy requested if patient's swallowing could be reevaluated today due to patient being more alert and interactive.  Spoke with acute rehab services and requested SLP follow up.  Patient not due to be seen until Monday but they will see if patient can be reevaluated today.

## 2021-06-11 DIAGNOSIS — R569 Unspecified convulsions: Secondary | ICD-10-CM | POA: Diagnosis not present

## 2021-06-11 LAB — BASIC METABOLIC PANEL
Anion gap: 9 (ref 5–15)
BUN: 23 mg/dL (ref 8–23)
CO2: 30 mmol/L (ref 22–32)
Calcium: 8.1 mg/dL — ABNORMAL LOW (ref 8.9–10.3)
Chloride: 101 mmol/L (ref 98–111)
Creatinine, Ser: 1.91 mg/dL — ABNORMAL HIGH (ref 0.61–1.24)
GFR, Estimated: 39 mL/min — ABNORMAL LOW (ref >60)
Glucose, Bld: 95 mg/dL (ref 70–99)
Potassium: 4.7 mmol/L (ref 3.5–5.1)
Sodium: 140 mmol/L (ref 135–145)

## 2021-06-11 LAB — GLUCOSE, CAPILLARY: Glucose-Capillary: 104 mg/dL — ABNORMAL HIGH (ref 70–99)

## 2021-06-11 LAB — MAGNESIUM: Magnesium: 1.5 mg/dL — ABNORMAL LOW (ref 1.7–2.4)

## 2021-06-11 MED ORDER — ALPRAZOLAM 0.25 MG PO TABS: 0.2500 mg | ORAL_TABLET | Freq: Three times a day (TID) | ORAL | Status: DC | PRN

## 2021-06-11 MED ORDER — FREE WATER
200.0000 mL | Status: DC
  Administered 2021-06-11 – 2021-06-12 (×5): 200 mL

## 2021-06-11 MED ORDER — HALOPERIDOL LACTATE 2 MG/ML PO CONC
1.0000 mg | Freq: Four times a day (QID) | ORAL | Status: DC | PRN
  Filled 2021-06-11: qty 1

## 2021-06-11 MED ORDER — MAGNESIUM SULFATE 4 GM/100ML IV SOLN
4.0000 g | Freq: Once | INTRAVENOUS | Status: AC
  Administered 2021-06-11: 4 g via INTRAVENOUS
  Filled 2021-06-11: qty 100

## 2021-06-11 NOTE — Progress Notes (Signed)
Patient agitated; attempting to get out of bed; wife remains at bedside; medicated prn for anxiety/agitation.

## 2021-06-11 NOTE — Progress Notes (Signed)
Physical Therapy Treatment Patient Details Name: [name] MRN: [id number] DOB: 10-May-1960 Today's Date: 06/11/2021   History of Present Illness Pt is a 61 y/o male who presents with recurrent seizures. PMH significant for CAD, CHF, HTN, CKD IV, Raynard's, GIB, recent history of seizures, hypothyroidism.    PT Comments    Pt supine in bed.  He reports," I am dead." Repeatedly.  Pt was agreeable with encouragement and support from PTA and family.   He continues to present with functional decline this session.  Family remains to support patient returning home.    Recommendations for follow up therapy are one component of a multi-disciplinary discharge planning process, led by the attending physician.  Recommendations may be updated based on patient status, additional functional criteria and insurance authorization.  Follow Up Recommendations  Home health PT ("we are taking him home." reports family.)     Assistance Recommended at Discharge Frequent or constant Supervision/Assistance  Patient can return home with the following Two people to help with walking and/or transfers;Assist for transportation;Help with stairs or ramp for entrance   Equipment Recommendations  Hospital bed;Rolling walker (2 wheels);Wheelchair (measurements PT);Wheelchair cushion (measurements PT);BSC/3in1    Recommendations for Other Services Rehab consult     Precautions / Restrictions Precautions Precautions: Fall Precaution Comments: has had history of agitation Restrictions Weight Bearing Restrictions: No     Mobility  Bed Mobility Overal bed mobility: Needs Assistance Bed Mobility: Rolling, Sidelying to Sit   Sidelying to sit: Min assist       General bed mobility comments: MIn assistance for rolling, reaching and moving to edge of bed.  Increased time and cues to push with R arm to achieve seated position.  Presents with flexed posture this session sitting.    Transfers Overall transfer  level: Needs assistance Equipment used: Rolling walker (2 wheels) Transfers: Sit to/from Stand Sit to Stand: Mod assist           General transfer comment: Cues for hand placement and assistance to boost into standing.  Increased time for hip and trunk extension.    Ambulation/Gait Ambulation/Gait assistance: Max assist, +2 physical assistance Gait Distance (Feet): 75 Feet Assistive device: Rolling walker (2 wheels) Gait Pattern/deviations: Decreased step length - right, Decreased step length - left, Decreased weight shift to right, Decreased weight shift to left, Knee flexed in stance - right, Knee flexed in stance - left, Knees buckling, Leaning posteriorly, Staggering left, Staggering right, Drifts right/left, Trunk flexed       General Gait Details: Pt continues to maintain flexed knees and hips with rounded spine.  He required physical assistance to steer device and keep close to her person.  He continues to fatigue quickly.   Stairs             Wheelchair Mobility    Modified Rankin (Stroke Patients Only)       Balance Overall balance assessment: Needs assistance Sitting-balance support: Feet supported, Bilateral upper extremity supported, No upper extremity supported Sitting balance-Leahy Scale: Poor Sitting balance - Comments: Anterior lean, poor recovery of truncal balance with position changes to sitting. Middle to end of session, pt was needing assist to sit in chair and on bed due to severe anterior lean.   Standing balance support: During functional activity, Bilateral upper extremity supported Standing balance-Leahy Scale: Poor Standing balance comment: +2 required with pt relying on UE support and external support of 2 persons up to max assist.  Cognition Arousal/Alertness: Awake/alert Behavior During Therapy: Restless, Anxious Overall Cognitive Status: Impaired/Different from baseline                                  General Comments: Reports multiple times ," I am dead."        Exercises Other Exercises Other Exercises: Cervical extension x 10 reps in seated straight back chair. Other Exercises: Sit to stand x 5 reps with emphasis on posture and anterior translation.    General Comments        Pertinent Vitals/Pain Pain Assessment Pain Assessment: No/denies pain Faces Pain Scale: No hurt    Home Living                          Prior Function            PT Goals (current goals can now be found in the care plan section) Acute Rehab PT Goals Patient Stated Goal: None stated by patient Potential to Achieve Goals: Good Progress towards PT goals: Progressing toward goals    Frequency    Min 3X/week      PT Plan Current plan remains appropriate    Co-evaluation              AM-PAC PT "6 Clicks" Mobility   Outcome Measure  Help needed turning from your back to your side while in a flat bed without using bedrails?: A Lot Help needed moving from lying on your back to sitting on the side of a flat bed without using bedrails?: A Lot Help needed moving to and from a bed to a chair (including a wheelchair)?: Total Help needed standing up from a chair using your arms (e.g., wheelchair or bedside chair)?: Total Help needed to walk in hospital room?: Total Help needed climbing 3-5 steps with a railing? : Total 6 Click Score: 8    End of Session Equipment Utilized During Treatment: Gait belt;Oxygen Activity Tolerance: Patient tolerated treatment well Patient left: in bed;with call bell/phone within reach;with family/visitor present Nurse Communication: Mobility status PT Visit Diagnosis: Other symptoms and signs involving the nervous system (Z48.270)     Time: 7867-5449 PT Time Calculation (min) (ACUTE ONLY): 20 min  Charges:  $Gait Training: 8-22 mins                     [name] , PTA Acute Rehabilitation Services Pager  [phone] Office [phone]    [name] 06/11/2021, 1:00 PM

## 2021-06-11 NOTE — Progress Notes (Signed)
[name]  RCV:[id number] DOB: [birth year] DOA: 06/03/2021 PCP: [name], MD    Brief Narrative:  34GO with a history of CAD, CHF/PAH, HTN, CKD stage IV, Scleroderma, GIB, and a recent history of seizures who presented to the ED with recurrent seizure activity.  Consultants:  Neurology CHF Team   Code Status: FULL CODE  Antimicrobials:  None  DVT prophylaxis: Subcutaneous heparin  Interim Hx: Afebrile.  Vital signs stable.  Saturation 100% on 2 L.  SLP cleared the patient for dysphagia 1 thin liquid diet on evaluation 2/17 after improvement noted since 2/16.  Frequent episodes of significant agitation with attempts to get out of bed were encountered yesterday evening/last night, and have persisted throughout the day today.  Assessment & Plan:  Recurrent seizure activity Neurology has completed their evaluation- has been difficult to manage, requiring multiple medication adjustments - appears to be making slow progress now - ultimate plan is for patient to go home with family  Acute toxic metabolic encephalopathy LP accomplished and patient initially empirically covered with antibiotics which have subsequently been stopped with negative cultures -testing for HSV negative so acyclovir discontinued -mental status slowly improving but episodes of agitation are currently challenging  Severe hypomagnesemia Magnesium 1.0 at initial presentation -recurring -continue to supplement and follow  Macrocytic anemia -anemia of chronic kidney disease B12 455 - hemoglobin slowly improving  Hypernatremia Increased free water 2/15 -sodium has normalized -slow free water via tube and follow  Chronic diastolic CHF with acute exacerbation No volume overload appreciated on exam - CHF team has evaluated- diuretic on hold for now  Hypocalcemia Ionized calcium 0.86 at initial presentation - calcium stable/improved  Pulmonary hypertension Appears well compensated presently  Severe  protein calorie malnutrition Continue tube feed supplementation -diet advanced by SLP 2/17  HLD Continue medical therapy  HTN Blood pressure currently well controlled  Stage IV CKD Baseline creatinine approximately 2.7 -creatinine stable/better than baseline presently  Tobacco abuse   Family Communication: Spoke with family at bedside Disposition: From home - for d/c home when medically clear   Objective: Blood pressure 132/79, pulse 96, temperature 97.7 F (36.5 C), temperature source Oral, resp. rate 16, height _0  (1.753 m), weight 49.9 kg, SpO2 100 %.  Intake/Output Summary (Last 24 hours) at 06/11/2021 0850 Last data filed at 06/11/2021 0200 Gross per 24 hour  Intake 1985.13 ml  Output --  Net 1985.13 ml    Filed Weights   06/03/21 1626 06/08/21 1523 06/08/21 1729  Weight: 52.2 kg 49.9 kg 49.9 kg    Examination: General: No acute respiratory distress Lungs: Clear to auscultation bilaterally -no wheezing Cardiovascular: RRR without murmur  Abdomen: NT/ND, soft, bs+ Extremities: No edema BLE  CBC: Recent Labs  Lab 06/06/21 0233 06/08/21 0435 06/09/21 0247  WBC 4.6 3.8* 4.0  NEUTROABS 3.1 2.4 2.3  HGB 8.7* 7.4* 8.5*  HCT 30.5* 24.8* 28.7*  MCV 103.7* 100.8* 102.1*  PLT 210 181 770    Basic Metabolic Panel: Recent Labs  Lab 06/07/21 1635 06/08/21 0435 06/08/21 1645 06/09/21 0247 06/10/21 0908 06/11/21 0450  NA  --  151*  --  150* 145 140  K  --  3.7  --  4.2 4.6 4.7  CL  --  114*  --  111 105 101  CO2  --  28  --  28 32 30  GLUCOSE  --  94  --  105* 95 95  BUN  --  16  --  20 23  23  CREATININE  --  2.18*  --  2.26* 2.11* 1.91*  CALCIUM  --  7.5*  --  7.8* 7.9* 8.1*  MG 3.3* 2.6* 2.3 2.0  --  1.5*  PHOS 3.1 3.2 3.0  --   --   --     GFR: Estimated Creatinine Clearance: 28.3 mL/min (A) (by C-G formula based on SCr of 1.91 mg/dL (H)).  Liver Function Tests: Recent Labs  Lab 06/07/21 0444 06/08/21 0435 06/10/21 0908  AST  --  20  --    ALT  --  15  --   ALKPHOS  --  66  --   BILITOT  --  0.2*  --   PROT  --  5.4*  --   ALBUMIN 2.0* 1.8* 2.1*    HbA1C: Hgb A1c MFr Bld  Date/Time Value Ref Range Status  10/09/2019 04:50 AM 4.9 4.8 - 5.6 % Final    Comment:    (NOTE)         Prediabetes: 5.7 - 6.4         Diabetes: >6.4         Glycemic control for adults with diabetes: <7.0     CBG: Recent Labs  Lab 06/07/21 1209 06/07/21 1641 06/07/21 2046 06/08/21 0739 06/11/21 0817  GLUCAP 116* 121* 150* 86 104*    Scheduled Meds:  calcium carbonate  200 mg of elemental calcium Oral BID   chlorhexidine gluconate (MEDLINE KIT)  15 mL Mouth Rinse BID   feeding supplement (PROSource TF)  45 mL Per Tube Daily   fluconazole  100 mg Per Tube Daily   free water  250 mL Per Tube Q4H   heparin  5,000 Units Subcutaneous Q8H   lacosamide  50 mg Per Tube BID   macitentan  10 mg Oral Daily   mouth rinse  15 mL Mouth Rinse 10 times per day   multivitamin with minerals  1 tablet Per Tube Daily   nicotine  21 mg Transdermal Daily   pantoprazole sodium  40 mg Per Tube Daily   phenytoin  50 mg Per Tube TID   rosuvastatin  20 mg Per Tube Daily   sildenafil  40 mg Per Tube TID   thiamine  100 mg Per Tube Daily   Continuous Infusions:  brivaracetam (BRIVIACT) IVPB Stopped (06/10/21 2154)   feeding supplement (OSMOLITE 1.2 CAL) 1,000 mL (06/11/21 0443)     LOS: 8 days   Cherene Altes, MD [facility] Office  [phone] Pager - Text Page per Shea Evans  If 7PM-7AM, please contact night-coverage per Amion 06/11/2021, 8:50 AM

## 2021-06-12 DIAGNOSIS — R569 Unspecified convulsions: Secondary | ICD-10-CM | POA: Diagnosis not present

## 2021-06-12 LAB — BASIC METABOLIC PANEL
Anion gap: 8 (ref 5–15)
BUN: 24 mg/dL — ABNORMAL HIGH (ref 8–23)
CO2: 28 mmol/L (ref 22–32)
Calcium: 8 mg/dL — ABNORMAL LOW (ref 8.9–10.3)
Chloride: 99 mmol/L (ref 98–111)
Creatinine, Ser: 2.01 mg/dL — ABNORMAL HIGH (ref 0.61–1.24)
GFR, Estimated: 37 mL/min — ABNORMAL LOW (ref >60)
Glucose, Bld: 90 mg/dL (ref 70–99)
Potassium: 5.2 mmol/L — ABNORMAL HIGH (ref 3.5–5.1)
Sodium: 135 mmol/L (ref 135–145)

## 2021-06-12 LAB — GLUCOSE, CAPILLARY: Glucose-Capillary: 102 mg/dL — ABNORMAL HIGH (ref 70–99)

## 2021-06-12 LAB — MAGNESIUM: Magnesium: 2.5 mg/dL — ABNORMAL HIGH (ref 1.7–2.4)

## 2021-06-12 MED ORDER — HALOPERIDOL LACTATE 2 MG/ML PO CONC
1.0000 mg | Freq: Four times a day (QID) | ORAL | Status: DC | PRN
  Filled 2021-06-12: qty 1

## 2021-06-12 MED ORDER — SODIUM CHLORIDE 0.9 % IV SOLN
100.0000 mg | Freq: Two times a day (BID) | INTRAVENOUS | Status: DC
  Administered 2021-06-12: 100 mg via INTRAVENOUS
  Filled 2021-06-12 (×2): qty 10

## 2021-06-12 MED ORDER — BRIVARACETAM 25 MG PO TABS
100.0000 mg | ORAL_TABLET | Freq: Two times a day (BID) | ORAL | Status: DC
  Administered 2021-06-12 – 2021-06-15 (×6): 100 mg via ORAL
  Filled 2021-06-12 (×6): qty 4

## 2021-06-12 MED ORDER — BRIVARACETAM 10 MG/ML PO SOLN: 100.0000 mg | Freq: Two times a day (BID) | ORAL | Status: DC

## 2021-06-12 MED ORDER — PHENYTOIN 125 MG/5ML PO SUSP
50.0000 mg | Freq: Three times a day (TID) | ORAL | Status: DC
  Administered 2021-06-12 (×2): 50 mg via ORAL
  Filled 2021-06-12 (×3): qty 4

## 2021-06-12 MED ORDER — FLUCONAZOLE 40 MG/ML PO SUSR
100.0000 mg | Freq: Every day | ORAL | Status: AC
  Administered 2021-06-13 – 2021-06-15 (×3): 100 mg via ORAL
  Filled 2021-06-12 (×3): qty 2.5

## 2021-06-12 MED ORDER — FREE WATER: 100.0000 mL | Status: DC

## 2021-06-12 MED ORDER — NICOTINE 14 MG/24HR TD PT24
14.0000 mg | MEDICATED_PATCH | Freq: Every day | TRANSDERMAL | Status: DC
  Administered 2021-06-13 – 2021-06-15 (×3): 14 mg via TRANSDERMAL
  Filled 2021-06-12 (×3): qty 1

## 2021-06-12 MED ORDER — PANTOPRAZOLE 2 MG/ML SUSPENSION: 40.0000 mg | Freq: Every day | ORAL | Status: DC

## 2021-06-12 MED ORDER — SILDENAFIL CITRATE 20 MG PO TABS
40.0000 mg | ORAL_TABLET | Freq: Three times a day (TID) | ORAL | Status: DC
  Administered 2021-06-12 – 2021-06-15 (×9): 40 mg via ORAL
  Filled 2021-06-12 (×10): qty 2

## 2021-06-12 MED ORDER — LACOSAMIDE 50 MG PO TABS
50.0000 mg | ORAL_TABLET | Freq: Two times a day (BID) | ORAL | Status: DC
  Administered 2021-06-12 – 2021-06-15 (×6): 50 mg via ORAL
  Filled 2021-06-12 (×6): qty 1

## 2021-06-12 NOTE — Progress Notes (Addendum)
Patient's IV began leaking after patient received about 1/4 of his 1000 dose of brivaracetam. It was not noticed that the patient did not receive the remainder of his dose. Pharmacy and Dr. Alcario Drought notified. Per Dr. Alcario Drought, give 2200 dose early. Pharmacy notified. Patient's wife at bedside and made aware. Remainder of brivaracetam dose wasted in steri-cycle with Sheryn Bison, RN.

## 2021-06-12 NOTE — Progress Notes (Signed)
Handoff with Markus Jarvis, Patient will get PO dose of Brivaracetam PO at 2030 according to MD and Pharmacy. Wasted remainder of IV Brivaracetam (1000 dose) in 3W med room steri-cycle with Markus Jarvis as also stated in her note.

## 2021-06-12 NOTE — Progress Notes (Signed)
[name]  PXT:[id number] DOB: 1960-08-19 DOA: 06/03/2021 PCP: [name], MD    Brief Narrative:  60VO with a history of CAD, CHF/PAH, HTN, CKD stage IV, Scleroderma, GIB, and a recent history of seizures who presented to the ED with recurrent seizure activity.  Consultants:  Neurology CHF Team   Code Status: FULL CODE  Antimicrobials:  None  DVT prophylaxis: Subcutaneous heparin  Interim Hx: Afebrile.  Vital signs stable.  Saturation 98% 2 L.  I had a prolonged discussion with the patient's wife and daughter at bedside.  They feel strongly that the patient's agitation would improve if his small bore feeding tube could be removed.  They state that they have been able to get in a fair amount of intake on his modified diet.  They are convinced they can get him to take his meds by mouth as well.  The patient is more alert today and is able to answer some basic questions though at times is definitely agitated.  Assessment & Plan:  Recurrent seizure activity Neurology has completed their evaluation- has been difficult to manage, requiring multiple medication adjustments -continues to make very slow progress - ultimate plan is for patient to go home with family  Acute toxic metabolic encephalopathy LP accomplished and patient initially empirically covered with antibiotics which have subsequently been stopped with negative cultures -testing for HSV negative so acyclovir discontinued -mental status slowly improving but episodes of agitation are currently challenging -felt to be related to seizure activity  Severe hypomagnesemia Magnesium 1.0 at initial presentation -corrected with supplementation  Macrocytic anemia -anemia of chronic kidney disease B12 455 - hemoglobin slowly improving  Hypernatremia Increased free water 2/15 -sodium has normalized -stop IV fluid and follow with oral intake only  Chronic diastolic CHF with acute exacerbation No volume overload on exam -  CHF team has evaluated- diuretic on hold for now  Hypocalcemia Ionized calcium 0.86 at initial presentation - calcium stable/improved  Pulmonary hypertension well compensated presently  Severe protein calorie malnutrition diet advanced by SLP 2/17 -as discussed above will discontinue tube feeds today and give patient a trial of oral intake only  HLD Continue medical therapy  HTN Blood pressure currently well controlled  Stage IV CKD Baseline creatinine approximately 2.7 -creatinine stable/better than baseline presently  Tobacco abuse   Family Communication: Spoke with wife and daughter at bedside at length Disposition: From home - for d/c home when medically clear   Objective: Blood pressure 133/90, pulse 88, temperature 98.2 F (36.8 C), temperature source Axillary, resp. rate 16, height 5' 9" (1.753 m), weight 50.8 kg, SpO2 98 %.  Intake/Output Summary (Last 24 hours) at 06/12/2021 0803 Last data filed at 06/12/2021 0342 Gross per 24 hour  Intake 2529.5 ml  Output 200 ml  Net 2329.5 ml    Filed Weights   06/08/21 1523 06/08/21 1729 06/12/21 0500  Weight: 49.9 kg 49.9 kg 50.8 kg    Examination: General: No acute respiratory distress Lungs: Clear to auscultation bilaterally without wheezing Cardiovascular: RRR without murmur  Abdomen: NT/ND, soft, bs+ Extremities: No edema B lower extremities  CBC: Recent Labs  Lab 06/06/21 0233 06/08/21 0435 06/09/21 0247  WBC 4.6 3.8* 4.0  NEUTROABS 3.1 2.4 2.3  HGB 8.7* 7.4* 8.5*  HCT 30.5* 24.8* 28.7*  MCV 103.7* 100.8* 102.1*  PLT 210 181 350    Basic Metabolic Panel: Recent Labs  Lab 06/07/21 1635 06/08/21 0435 06/08/21 1645 06/09/21 0247 06/10/21 0908 06/11/21 0450 06/12/21 0316  NA  --  151*  --  150* 145 140 135  K  --  3.7  --  4.2 4.6 4.7 5.2*  CL  --  114*  --  111 105 101 99  CO2  --  28  --  28 32 30 28  GLUCOSE  --  94  --  105* 95 95 90  BUN  --  16  --  _0 24*  CREATININE  --  2.18*   --  2.26* 2.11* 1.91* 2.01*  CALCIUM  --  7.5*  --  7.8* 7.9* 8.1* 8.0*  MG 3.3* 2.6* 2.3 2.0  --  1.5* 2.5*  PHOS 3.1 3.2 3.0  --   --   --   --     GFR: Estimated Creatinine Clearance: 27.4 mL/min (A) (by C-G formula based on SCr of 2.01 mg/dL (H)).  Liver Function Tests: Recent Labs  Lab 06/07/21 0444 06/08/21 0435 06/10/21 0908  AST  --  20  --   ALT  --  15  --   ALKPHOS  --  66  --   BILITOT  --  0.2*  --   PROT  --  5.4*  --   ALBUMIN 2.0* 1.8* 2.1*    Scheduled Meds:  calcium carbonate  200 mg of elemental calcium Oral BID   chlorhexidine gluconate (MEDLINE KIT)  15 mL Mouth Rinse BID   feeding supplement (PROSource TF)  45 mL Per Tube Daily   fluconazole  100 mg Per Tube Daily   free water  200 mL Per Tube Q4H   heparin  5,000 Units Subcutaneous Q8H   lacosamide  50 mg Per Tube BID   macitentan  10 mg Oral Daily   mouth rinse  15 mL Mouth Rinse 10 times per day   multivitamin with minerals  1 tablet Per Tube Daily   nicotine  21 mg Transdermal Daily   pantoprazole sodium  40 mg Per Tube Daily   phenytoin  50 mg Per Tube TID   rosuvastatin  20 mg Per Tube Daily   sildenafil  40 mg Per Tube TID   thiamine  100 mg Per Tube Daily   Continuous Infusions:  brivaracetam (BRIVIACT) IVPB 100 mg (06/11/21 2317)   feeding supplement (OSMOLITE 1.2 CAL) 1,000 mL (06/12/21 0631)     LOS: 9 days   Cherene Altes, MD [facility] Office  [phone] Pager - Text Page per Shea Evans  If 7PM-7AM, please contact night-coverage per Amion 06/12/2021, 8:03 AM

## 2021-06-12 NOTE — Progress Notes (Signed)
Physical Therapy Treatment Patient Details Name: [name] MRN: [id number] DOB: [birth year] Today's Date: 06/12/2021   History of Present Illness Pt is a 61 y/o male who presents with recurrent seizures. PMH significant for CAD, CHF, HTN, CKD IV, Raynard's, GIB, recent history of seizures, hypothyroidism.    PT Comments    Patient progressing this session with upright posture and improved ambulation, though still limited by safety due to pt's cognitive deficits.  Wife in the room and reports feels medication playing a role in his mentation.  Felt he was better prior to having some infusion today.  Patient able to move to supine on his own and assist up to EOB only for balance.  He will continue to benefit from skilled PT in the acute setting prior to d/c home with family assist and HHPT.    Recommendations for follow up therapy are one component of a multi-disciplinary discharge planning process, led by the attending physician.  Recommendations may be updated based on patient status, additional functional criteria and insurance authorization.  Follow Up Recommendations  Home health PT     Assistance Recommended at Discharge Frequent or constant Supervision/Assistance  Patient can return home with the following     Equipment Recommendations  Hospital bed;Rolling walker (2 wheels);Wheelchair (measurements PT);Wheelchair cushion (measurements PT);BSC/3in1    Recommendations for Other Services       Precautions / Restrictions Precautions Precautions: Fall Precaution Comments: has had history of agitation     Mobility  Bed Mobility Overal bed mobility: Needs Assistance Bed Mobility: Supine to Sit, Sit to Supine Rolling: Supervision   Supine to sit: Min assist, HOB elevated Sit to supine: Supervision   General bed mobility comments: returns to supine on his own, assist for safety initiating coming up to sit. rolls on his own for assisting with brief change x 2     Transfers Overall transfer level: Needs assistance Equipment used: Rolling walker (2 wheels), 1 person hand held assist Transfers: Sit to/from Stand Sit to Stand: Min assist, Mod assist           General transfer comment: up to stand to RW versus with one person two hand hold assist with min A for balance, to sit at times mod A due to agitation/confusion and needing to sit to change brief and apply new one.    Ambulation/Gait Ambulation/Gait assistance: Min assist, +2 safety/equipment, Mod assist Gait Distance (Feet): 60 Feet Assistive device: Rolling walker (2 wheels) Gait Pattern/deviations: Step-through pattern, Decreased stride length, Trunk flexed       General Gait Details: in hallway initially with min A and wheelchair close if needed, but pt still somewhat agitated continuing to perseverate on "I'm pissing" needing to return to room for safety and security; patient needing increased assist once entered the room due to initiating flexed posture so PT positioned posteriorly to assist with hips forward and chest upright   Stairs             Wheelchair Mobility    Modified Rankin (Stroke Patients Only)       Balance Overall balance assessment: Needs assistance Sitting-balance support: Feet supported Sitting balance-Leahy Scale: Fair Sitting balance - Comments: close S for safety, but able to sit on his own, did return to supine to don brief rather than standing   Standing balance support: Bilateral upper extremity supported Standing balance-Leahy Scale: Poor Standing balance comment: UE support and min A at least for balance  Cognition Arousal/Alertness: Awake/alert Behavior During Therapy: Restless, Agitated Overall Cognitive Status: Impaired/Different from baseline Area of Impairment: Orientation, Attention, Memory, Following commands, Safety/judgement, Problem solving, Awareness                 Orientation  Level: Disoriented to, Place, Person, Time, Situation Current Attention Level: Focused Memory: Decreased short-term memory Following Commands: Follows one step commands inconsistently, Follows one step commands with increased time Safety/Judgement: Decreased awareness of safety, Decreased awareness of deficits Awareness: Intellectual Problem Solving: Slow processing, Requires verbal cues, Requires tactile cues General Comments: perseverating on "I'm pissing" even after brief changed, finally replaced brief again and daughter washing him off.        Exercises      General Comments General comments (skin integrity, edema, etc.): wife and daughter present and assisting, on O2 at rest, on RA for ambulation with VSS      Pertinent Vitals/Pain Pain Assessment Faces Pain Scale: No hurt    Home Living                          Prior Function            PT Goals (current goals can now be found in the care plan section) Progress towards PT goals: Progressing toward goals    Frequency    Min 3X/week      PT Plan Current plan remains appropriate    Co-evaluation              AM-PAC PT "6 Clicks" Mobility   Outcome Measure  Help needed turning from your back to your side while in a flat bed without using bedrails?: A Lot Help needed moving from lying on your back to sitting on the side of a flat bed without using bedrails?: A Little Help needed moving to and from a bed to a chair (including a wheelchair)?: A Lot Help needed standing up from a chair using your arms (e.g., wheelchair or bedside chair)?: A Lot Help needed to walk in hospital room?: Total Help needed climbing 3-5 steps with a railing? : Total 6 Click Score: 11    End of Session Equipment Utilized During Treatment: Gait belt;Oxygen Activity Tolerance: Treatment limited secondary to agitation Patient left: in bed;with call bell/phone within reach;with restraints reapplied;with bed alarm set;with  family/visitor present         Time: 1100-1120 PT Time Calculation (min) (ACUTE ONLY): 20 min  Charges:  $Gait Training: 8-22 mins                     Magda Kiel, PT Acute Rehabilitation Services Pager:[phone] [phone] 06/12/2021    Reginia Naas 06/12/2021, 3:55 PM

## 2021-06-13 LAB — GLUCOSE, CAPILLARY
Glucose-Capillary: 136 mg/dL — ABNORMAL HIGH (ref 70–99)
Glucose-Capillary: 117 mg/dL — ABNORMAL HIGH (ref 70–99)

## 2021-06-13 LAB — BASIC METABOLIC PANEL
Anion gap: 10 (ref 5–15)
BUN: 33 mg/dL — ABNORMAL HIGH (ref 8–23)
CO2: 24 mmol/L (ref 22–32)
Calcium: 8.3 mg/dL — ABNORMAL LOW (ref 8.9–10.3)
Chloride: 102 mmol/L (ref 98–111)
Creatinine, Ser: 2.49 mg/dL — ABNORMAL HIGH (ref 0.61–1.24)
GFR, Estimated: 28 mL/min — ABNORMAL LOW (ref >60)
Glucose, Bld: 83 mg/dL (ref 70–99)
Potassium: 5.9 mmol/L — ABNORMAL HIGH (ref 3.5–5.1)
Sodium: 136 mmol/L (ref 135–145)

## 2021-06-13 LAB — ALBUMIN: Albumin: 2.8 g/dL — ABNORMAL LOW (ref 3.5–5.0)

## 2021-06-13 LAB — MAGNESIUM: Magnesium: 2.1 mg/dL (ref 1.7–2.4)

## 2021-06-13 LAB — PHENYTOIN LEVEL, TOTAL: Phenytoin Lvl: 23.4 ug/mL — ABNORMAL HIGH (ref 10.0–20.0)

## 2021-06-13 MED ORDER — SODIUM ZIRCONIUM CYCLOSILICATE 10 G PO PACK
10.0000 g | PACK | Freq: Once | ORAL | Status: AC
  Administered 2021-06-13: 10 g via ORAL
  Filled 2021-06-13: qty 1

## 2021-06-13 MED ORDER — ENSURE ENLIVE PO LIQD
237.0000 mL | Freq: Three times a day (TID) | ORAL | Status: DC
  Administered 2021-06-13 – 2021-06-15 (×5): 237 mL via ORAL

## 2021-06-13 MED ORDER — QUETIAPINE FUMARATE 25 MG PO TABS
25.0000 mg | ORAL_TABLET | Freq: Every day | ORAL | Status: DC
  Administered 2021-06-13 – 2021-06-14 (×2): 25 mg via ORAL
  Filled 2021-06-13 (×2): qty 1

## 2021-06-13 MED ORDER — PHENYTOIN 125 MG/5ML PO SUSP
25.0000 mg | Freq: Three times a day (TID) | ORAL | Status: DC
  Administered 2021-06-13 – 2021-06-15 (×6): 25 mg via ORAL
  Filled 2021-06-13 (×7): qty 4

## 2021-06-13 NOTE — Progress Notes (Signed)
Occupational Therapy Treatment Patient Details Name: [name] MRN: [id number] DOB: 1960-12-24 Today's Date: 06/13/2021   History of present illness Pt is a 61 y/o male who presents with recurrent seizures. PMH significant for CAD, CHF, HTN, CKD IV, Raynard's, GIB, recent history of seizures, hypothyroidism.   OT comments  Patient received in supine and agreeable to OT treatment. Once patient was able to get to EOB he attempted to stand unassisted.  Therapist instructed patient he needed RW and gait belt for safety, patient stated, " I don't need safety" and began walking to bathroom and was min assist with hand held assist. Patient's wife assisted with toileting and patient used RW after using bathroom with patient standing at sink for grooming tasks and performing functional mobility in hallway. Patient asked to go back to bed and was instructed on BUE strengthening exercises with level one therapy band.  Acute OT to continue to follow.    Recommendations for follow up therapy are one component of a multi-disciplinary discharge planning process, led by the attending physician.  Recommendations may be updated based on patient status, additional functional criteria and insurance authorization.    Follow Up Recommendations  Home health OT    Assistance Recommended at Discharge Frequent or constant Supervision/Assistance  Patient can return home with the following  A lot of help with walking and/or transfers;A lot of help with bathing/dressing/bathroom;Assistance with feeding;Direct supervision/assist for medications management;Assist for transportation;Help with stairs or ramp for entrance   Equipment Recommendations  BSC/3in1;Wheelchair (measurements OT);Wheelchair cushion (measurements OT);Hospital bed    Recommendations for Other Services      Precautions / Restrictions Precautions Precautions: Fall Precaution Comments: has had history of agitation Restrictions Weight Bearing  Restrictions: No       Mobility Bed Mobility Overal bed mobility: Needs Assistance Bed Mobility: Supine to Sit, Sit to Supine     Supine to sit: Min assist, HOB elevated Sit to supine: Supervision   General bed mobility comments: min assist for LEs to get to EOB and patient was able to return to supine on his own    Transfers Overall transfer level: Needs assistance Equipment used: Rolling walker (2 wheels), 1 person hand held assist Transfers: Sit to/from Stand, Bed to chair/wheelchair/BSC Sit to Stand: Min assist     Step pivot transfers: Min assist     General transfer comment: Patient began walking to bathroom without RW due to agitation and stating he needed to use bathroom. after bathroom visit patient agreeable to RW     Balance Overall balance assessment: Needs assistance Sitting-balance support: Feet supported Sitting balance-Leahy Scale: Fair Sitting balance - Comments: supervision for sitting balance on EOB   Standing balance support: Single extremity supported, Bilateral upper extremity supported Standing balance-Leahy Scale: Poor Standing balance comment: patient unsteady with standing balance and initally refusing RW                           ADL either performed or assessed with clinical judgement   ADL Overall ADL's : Needs assistance/impaired     Grooming: Wash/dry hands;Wash/dry face;Minimal assistance;Standing Grooming Details (indicate cue type and reason): stood at sink for washing face and hands with assistance with turning on water         Upper Body Dressing : Minimal assistance;Sitting Upper Body Dressing Details (indicate cue type and reason): donned gown     Toilet Transfer: Minimal assistance;Ambulation Toilet Transfer Details (indicate cue type and reason): Patient  was in hurry to use bathroom and began walking without RW.  RW used to leave bathroom and return to [place] and Hygiene: Maximal  assistance Toileting - Clothing Manipulation Details (indicate cue type and reason): Patient's wife assisted patient with therapist  assisting with balance       General ADL Comments: Patient seen for toilet transfer, grooming and UB dressing with patient being impulsive and required cues for safety    Extremity/Trunk Assessment              Vision       Perception     Praxis      Cognition Arousal/Alertness: Awake/alert Behavior During Therapy: Restless, Agitated Overall Cognitive Status: Impaired/Different from baseline Area of Impairment: Orientation, Attention, Memory, Following commands, Safety/judgement, Problem solving, Awareness                 Orientation Level: Disoriented to, Place, Person, Time, Situation Current Attention Level: Focused Memory: Decreased short-term memory Following Commands: Follows one step commands inconsistently, Follows one step commands with increased time Safety/Judgement: Decreased awareness of safety, Decreased awareness of deficits Awareness: Intellectual Problem Solving: Slow processing, Requires verbal cues, Requires tactile cues General Comments: agitated initially at beginning of visit due to needing to go to bathroom.  More cooperative following bathroom visit and verbal cues from wife.        Exercises Exercises: General Upper Extremity General Exercises - Upper Extremity Shoulder Flexion: Strengthening, Both, 10 reps, Theraband Theraband Level (Shoulder Flexion): Level 1 (Yellow) Shoulder ABduction: Strengthening, 10 reps, Both, Supine, Theraband Theraband Level (Shoulder Abduction): Level 1 (Yellow) Elbow Flexion: Strengthening, Both, 15 reps, Supine, Theraband Theraband Level (Elbow Flexion): Level 1 (Yellow) Elbow Extension: Strengthening, Both, 10 reps, Supine, Theraband Theraband Level (Elbow Extension): Level 1 (Yellow)    Shoulder Instructions       General Comments      Pertinent Vitals/ Pain        Pain Assessment Pain Assessment: No/denies pain Pain Score: 0-No pain Faces Pain Scale: No hurt Pain Intervention(s): Monitored during session  Home Living                                          Prior Functioning/Environment              Frequency  Min 2X/week        Progress Toward Goals  OT Goals(current goals can now be found in the care plan section)  Progress towards OT goals: Progressing toward goals  Acute Rehab OT Goals Patient Stated Goal: go home OT Goal Formulation: With patient/family Time For Goal Achievement: 06/19/21 Potential to Achieve Goals: Good ADL Goals Pt Will Perform Grooming: with modified independence;standing Pt Will Perform Upper Body Dressing: with modified independence;sitting Pt Will Perform Lower Body Dressing: with modified independence;sit to/from stand Pt Will Transfer to Toilet: with supervision;ambulating Pt/caregiver will Perform Home Exercise Program: Increased strength;Both right and left upper extremity;With written HEP provided Additional ADL Goal #1: Pt will indep follow multi-step ADL task  Plan Discharge plan needs to be updated    Co-evaluation                 AM-PAC OT "6 Clicks" Daily Activity     Outcome Measure   Help from another person eating meals?: Total Help from another person taking care of personal grooming?: A Little Help from another  person toileting, which includes using toliet, bedpan, or urinal?: A Lot Help from another person bathing (including washing, rinsing, drying)?: A Lot Help from another person to put on and taking off regular upper body clothing?: A Lot Help from another person to put on and taking off regular lower body clothing?: A Lot 6 Click Score: 12    End of Session Equipment Utilized During Treatment: Rolling walker (2 wheels);Gait belt  OT Visit Diagnosis: Other abnormalities of gait and mobility (R26.89);Muscle weakness (generalized)  (M62.81);Unsteadiness on feet (R26.81)   Activity Tolerance Patient tolerated treatment well   Patient Left in bed;with call bell/phone within reach;with bed alarm set;with family/visitor present   Nurse Communication Mobility status        Time: 9872-1587 OT Time Calculation (min): 29 min  Charges: OT General Charges $OT Visit: 1 Visit OT Treatments $Self Care/Home Management : 8-22 mins $Therapeutic Exercise: 8-22 mins  Lodema Hong, OTA Acute Rehabilitation Services  Pager [phone] Office [place] 06/13/2021, 2:59 PM

## 2021-06-13 NOTE — Progress Notes (Signed)
Nutrition Follow-up  DOCUMENTATION CODES:   Severe malnutrition in context of chronic illness, Underweight  INTERVENTION:  - Ensure Enlive po TID, each supplement provides 350 kcal and 20 grams of protein   - Encourage PO intake   NUTRITION DIAGNOSIS:   Severe Malnutrition related to chronic illness (CHF, CKD) as evidenced by severe fat depletion, severe muscle depletion.  Ongoing   GOAL:   Patient will meet greater than or equal to 90% of their needs  Progressing  MONITOR:   Diet advancement, PO intake, Supplement acceptance, Labs, Weight trends, Skin, I & O's  REASON FOR ASSESSMENT:   Consult Assessment of nutrition requirement/status  ASSESSMENT:   61 yo male with a PMH of CAD, CHF, hypertension, CKD stage IV, Raynard's, GI bleed, and recent history of seizures who presents with recurrent seizures.  2/13 - Cortrak placed  2/19 - Cortrak removed   Per meal documentation, pt has been completing 0-60% x last four meal completions since last RD visit.   Per pt's family and RN, pt has been tolerating meals well. Per pt's family, pt drinks Boost at home, but also states that pt will drink any oral nutritional supplement, if provided and is agreeable to having those ordered for pt now that diet is advanced. RD to provide ONS.   Admit wt: 52.2 kg Current wt: 50.8 kg  Labs reviewed:  Potassium: 5.9  Medications reviewed.    Diet Order:   Diet Order             DIET - DYS 1 Room service appropriate? Yes with Assist; Fluid consistency: Thin  Diet effective now                   EDUCATION NEEDS:   Education needs have been addressed  Skin:  Skin Assessment: Skin Integrity Issues: Skin Integrity Issues:: Stage II Stage II: Mid sacrum  Last BM:  2/19; type 7  Height:   Ht Readings from Last 1 Encounters:  06/03/21 _0  (1.753 m)    Weight:   Wt Readings from Last 1 Encounters:  06/12/21 50.8 kg    BMI:  Body mass index is 16.54  kg/m.  Estimated Nutritional Needs:   Kcal:  1800-2000  Protein:  90-105 grams  Fluid:  >1.8 L    Maryruth Hancock, Dietetic Intern 06/13/2021 2:35 PM

## 2021-06-13 NOTE — Progress Notes (Signed)
[name]  LKG:[id number] DOB: 1960/09/15 DOA: 06/03/2021 PCP: [name], MD    Brief Narrative:  64YQ with a history of CAD, CHF/PAH, HTN, CKD stage IV, Scleroderma, GIB, and a recent history of seizures who presented to the ED with recurrent seizure activity.  Consultants:  Neurology CHF Team   Code Status: FULL CODE  Antimicrobials:  None  DVT prophylaxis: Subcutaneous heparin  Interim Hx: Significant agitation remains a problem with the patient managing to get out of bed last night.  Afebrile.  Vital signs stable.  BUNs/creatinine rising.  Potassium elevated at 5.9 today.  GFR estimated at approximately 28.  Patient is much more alert today though at times confused.  Assessment & Plan:  Recurrent seizure activity Neurology has completed their evaluation- has been difficult to manage, requiring multiple medication adjustments -continues to make very slow progress - ultimate plan is for patient to go home with family  Acute toxic metabolic encephalopathy LP accomplished and patient initially empirically covered with antibiotics which have subsequently been stopped with negative cultures -testing for HSV negative so acyclovir discontinued -mental status slowly improving but episodes of agitation are currently very challenging -felt to be related to seizure activity  Severe hypomagnesemia Magnesium 1.0 at initial presentation -corrected with supplementation  Hyperkalemia Due to renal failure -treat with [facility] today and follow  Macrocytic anemia -anemia of chronic kidney disease B12 455 - hemoglobin slowly improving  Hypernatremia Sodium normalized with free water via feeding tube -monitoring with oral intake only at this time  Chronic diastolic CHF with acute exacerbation No volume overload on exam - CHF team has evaluated- diuretic on hold for now  Hypocalcemia Ionized calcium 0.86 at initial presentation - calcium stable/improved  Pulmonary  hypertension well compensated presently  Severe protein calorie malnutrition diet advanced by SLP 2/17 -feeding tube now out with patient being given a trial of oral intake only as requested by family -consuming large amounts with assistance from family/doing well  HLD Continue medical therapy  HTN Blood pressure currently well controlled  Stage IV CKD Baseline creatinine approximately 2.7 -creatinine stable/better than baseline presently  Tobacco abuse   Family Communication: Spoke with wife at bedside at length Disposition: From home - for d/c home when medically clear - 06/15/2021 target discharge today  Objective: Blood pressure 132/62, pulse 83, temperature 98.1 F (36.7 C), temperature source Oral, resp. rate 18, height _0  (1.753 m), weight 50.8 kg, SpO2 95 %.  Intake/Output Summary (Last 24 hours) at 06/13/2021 0918 Last data filed at 06/12/2021 1800 Gross per 24 hour  Intake 812 ml  Output --  Net 812 ml    Filed Weights   06/08/21 1523 06/08/21 1729 06/12/21 0500  Weight: 49.9 kg 49.9 kg 50.8 kg    Examination: General: No acute respiratory distress Lungs: Clear to auscultation bilaterally -no wheezing Cardiovascular: RRR without murmur  Abdomen: NT/ND, soft, bs+ Extremities: No edema B lower extremities  CBC: Recent Labs  Lab 06/08/21 0435 06/09/21 0247  WBC 3.8* 4.0  NEUTROABS 2.4 2.3  HGB 7.4* 8.5*  HCT 24.8* 28.7*  MCV 100.8* 102.1*  PLT 181 034    Basic Metabolic Panel: Recent Labs  Lab 06/07/21 1635 06/08/21 0435 06/08/21 1645 06/09/21 0247 06/11/21 0450 06/12/21 0316 06/13/21 0230  NA  --  151*  --    < > 140 135 136  K  --  3.7  --    < > 4.7 5.2* 5.9*  CL  --  114*  --    < >  101 99 102  CO2  --  28  --    < > _0 GLUCOSE  --  94  --    < > 95 90 83  BUN  --  16  --    < > 23 24* 33*  CREATININE  --  2.18*  --    < > 1.91* 2.01* 2.49*  CALCIUM  --  7.5*  --    < > 8.1* 8.0* 8.3*  MG 3.3* 2.6* 2.3   < > 1.5* 2.5* 2.1   PHOS 3.1 3.2 3.0  --   --   --   --    < > = values in this interval not displayed.    GFR: Estimated Creatinine Clearance: 22.1 mL/min (A) (by C-G formula based on SCr of 2.49 mg/dL (H)).  Liver Function Tests: Recent Labs  Lab 06/07/21 0444 06/08/21 0435 06/10/21 0908  AST  --  20  --   ALT  --  15  --   ALKPHOS  --  66  --   BILITOT  --  0.2*  --   PROT  --  5.4*  --   ALBUMIN 2.0* 1.8* 2.1*    Scheduled Meds:  brivaracetam  100 mg Oral BID   chlorhexidine gluconate (MEDLINE KIT)  15 mL Mouth Rinse BID   fluconazole  100 mg Oral Daily   heparin  5,000 Units Subcutaneous Q8H   lacosamide  50 mg Oral BID   macitentan  10 mg Oral Daily   mouth rinse  15 mL Mouth Rinse 10 times per day   nicotine  14 mg Transdermal Daily   phenytoin  50 mg Oral TID   sildenafil  40 mg Oral TID     LOS: 10 days   Cherene Altes, MD [facility] Office  [phone] Pager - Text Page per Shea Evans  If 7PM-7AM, please contact night-coverage per Amion 06/13/2021, 9:18 AM

## 2021-06-13 NOTE — Progress Notes (Signed)
Explained reason for possibly placing another IV and Patient's spouse is refusing starting new IV for patient at this time due to repeated removal of IVs by patient. Patient is still restless but is somewhat redirectable.   Will attempt for IV placement once again when patient is more calm.

## 2021-06-13 NOTE — Progress Notes (Signed)
Patient had a fall at 0139, witnessed by family member (wife) and heard by NT prior to arriving to the room. Patient explains "I'm tryna get out of bed and go home." I walked in, saw patient on the floor on his buttocks and some of his left hip. Patient states no pain and after placed in bed, he is actively trying to climb out of bed repeatedly and agitated, trying to hit staff prior to placing a posey belt/soft waist restraint on him with an MD order.   Patient had been restless most of the evening and was agitated prior to 1900. Patient has ripped out two IVs during the evening. When asked about medication for agitation, wife did not want patient to have any PRN medications that would possibly confuse him further. Patient behavior over the past 48 hours has been in waves of confusion/agitation and intermittently knowing what is going on. Consistently throughout the night, he has been AAO x1-2.     06/13/21 0142  What Happened  Was fall witnessed? Yes  Who witnessed fall? Family member - Spouse  Patients activity before fall other (comment);ambulating-unassisted (Patient restless, trying to get out of bed)  Point of contact hip/leg;buttocks  Was patient injured? No  Follow Up  MD notified Sheran Luz, DO (Night)  Time MD notified [place] notified Yes - comment (Family in room)  Time family notified 0139  Additional tests No  Simple treatment Other (comment) (none)  Progress note created (see row info) Yes  Adult Fall Risk Assessment  Risk Factor Category (scoring not indicated) Fall has occurred during this admission (document High fall risk);High fall risk per protocol (document High fall risk)  Patient Fall Risk Level High fall risk  Adult Fall Risk Interventions  Required Bundle Interventions *See Row Information* High fall risk - low, moderate, and high requirements implemented  Additional Interventions Family Supervision;PT/OT need assessed if change in mobility from  baseline;Reorient/diversional activities with confused patients  Screening for Fall Injury Risk (To be completed on HIGH fall risk patients) - Assessing Need for Floor Mats  Risk For Fall Injury- Criteria for Floor Mats Confusion/dementia (+NuDESC, CIWA, TBI, etc.);Noncompliant with safety precautions  Will Implement Floor Mats Yes  Vitals  Temp 98.1 F (36.7 C)  Temp Source Axillary  BP (!) 152/89  MAP (mmHg) 100  BP Location Right Arm  BP Method Automatic  Patient Position (if appropriate) Lying  Pulse Rate 93  Pulse Rate Source Dinamap  Resp 16  Oxygen Therapy  SpO2 100 %  O2 Device Room Air  Patient Activity (if Appropriate) In bed  Pulse Oximetry Type Intermittent  Pain Assessment  Pain Scale 0-10  Pain Score 0 (Patient states "no I ain't hurting")  PCA/Epidural/Spinal Assessment  Respiratory Pattern Regular;Unlabored  Neurological  Neuro (WDL) X  Level of Consciousness Alert  Orientation Level Oriented to person;Disoriented to place;Disoriented to time;Disoriented to situation  Cognition Poor judgement;Poor safety awareness;Poor attention/concentration;Impulsive;Unable to follow commands  Speech Clear  R Pupil Size (mm) 3  R Pupil Shape Round  R Pupil Reaction Brisk  L Pupil Size (mm) 3  L Pupil Shape Round  L Pupil Reaction Brisk  Motor Function/Sensation Assessment Grip;Motor response;Motor strength  Facial Symmetry Symmetrical  R Hand Grip Strong  L Hand Grip Strong  RUE Motor Response Purposeful movement  RUE Motor Strength 4  LUE Motor Response Purposeful movement  LUE Motor Strength 4  RLE Motor Response Purposeful movement  RLE Motor Strength 4  LLE Motor Response  Purposeful movement  LLE Motor Strength 4  Neuro Symptoms Agitation;Forgetful  Neuro symptoms relieved by Rest  Glasgow Coma Scale  Eye Opening 4  Best Verbal Response (NON-intubated) 4  Best Motor Response 5  Glasgow Coma Scale Score 13  Musculoskeletal  Musculoskeletal (WDL) X   Assistive Device None  Generalized Weakness Yes  Weight Bearing Restrictions No  Integumentary  Integumentary (WDL) X  Skin Color Appropriate for ethnicity  Skin Condition Dry  Skin Integrity Scratch Marks  Scratch Marks Location Leg  Scratch Marks Location Orientation Right;Other (Comment) (Back of thigh)  Scratch Marks Intervention Cleansed;Barrier cream  Skin Turgor Non-tenting

## 2021-06-13 NOTE — Progress Notes (Addendum)
Advanced Heart Failure Rounding Note  PCP-Cardiologist: [name], MD   Subjective:    No further seizures.   S/p LP. Negative cultures. HSV negative.   Diuretics remain on hold. Hypernatremia resolved, Na 136 today   Wt has remained stable off diuretics.   Getting sildenafil but Opsumit not given consistently (held several doses due to NPO, unable to crush to give per tube.)  Scr 1.91>>2.01>>2.49 today but c/w baseline. K elevated at 5.9. Lokelma ordered.   Agitated overnight. Aggressive towards staff, pulled out PIVs and fell trying to get out of bed. Calmer today. Wife at bedside.   He denies dyspnea. Depressed. Feels he is end of life. Wants to go home.    Objective:   Weight Range: 50.8 kg Body mass index is 16.54 kg/m.   Vital Signs:   Temp:  [97.9 F (36.6 C)-98.1 F (36.7 C)] 98.1 F (36.7 C) (02/20 0837) Pulse Rate:  [83-100] 83 (02/20 0837) Resp:  [15-18] 18 (02/20 0837) BP: (132-152)/(62-89) 132/62 (02/20 0837) SpO2:  [95 %-100 %] 95 % (02/20 0837) Last BM Date :  (UTA;wife unsure of last BM date)  Weight change: Filed Weights   06/08/21 1523 06/08/21 1729 06/12/21 0500  Weight: 49.9 kg 49.9 kg 50.8 kg    Intake/Output:   Intake/Output Summary (Last 24 hours) at 06/13/2021 1014 Last data filed at 06/12/2021 1800 Gross per 24 hour  Intake 762 ml  Output --  Net 762 ml      Physical Exam    General:  thin. Cachetic and chronically ill appearing. No resp difficulty HEENT: Normal Neck: Supple. JVP 8 cm . Carotids 2+ bilat; no bruits. No lymphadenopathy or thyromegaly appreciated. Cor: PMI nondisplaced. Regular rate & rhythm. No rubs, gallops or murmurs. Lungs: Clear Abdomen: Soft, nontender, nondistended. No hepatosplenomegaly. No bruits or masses. Good bowel sounds. Extremities: No cyanosis, clubbing, rash, thin extremities, scleroderma digits  Neuro: Alert & orientedx3, cranial nerves grossly intact. moves all 4 extremities w/o  difficulty. Affect pleasant   Telemetry   N/A   EKG    No new EKG to review   Labs    CBC No results for input(s): WBC, NEUTROABS, HGB, HCT, MCV, PLT in the last 72 hours. Basic Metabolic Panel Recent Labs    06/12/21 0316 06/13/21 0230  NA 135 136  K 5.2* 5.9*  CL 99 102  CO2 28 24  GLUCOSE 90 83  BUN 24* 33*  CREATININE 2.01* 2.49*  CALCIUM 8.0* 8.3*  MG 2.5* 2.1   Liver Function Tests Recent Labs    06/13/21 0855  ALBUMIN 2.8*   No results for input(s): LIPASE, AMYLASE in the last 72 hours. Cardiac Enzymes No results for input(s): CKTOTAL, CKMB, CKMBINDEX, TROPONINI in the last 72 hours.  BNP: BNP (last 3 results) Recent Labs    02/17/21 1330 06/03/21 2130 06/09/21 0247  BNP 95.9 1,173.8* 606.2*    ProBNP (last 3 results) No results for input(s): PROBNP in the last 8760 hours.   D-Dimer No results for input(s): DDIMER in the last 72 hours. Hemoglobin A1C No results for input(s): HGBA1C in the last 72 hours. Fasting Lipid Panel No results for input(s): CHOL, HDL, LDLCALC, TRIG, CHOLHDL, LDLDIRECT in the last 72 hours. Thyroid Function Tests No results for input(s): TSH, T4TOTAL, T3FREE, THYROIDAB in the last 72 hours.  Invalid input(s): FREET3  Other results:   Imaging    No results found.   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 PAH, mild LV dysfunction and CKD 3b, admitted with refractory seizures.  EF  40-45% by echo. Serum Na elevated at 151 on admit, diuretics held.   Assessment/Plan   1. Acute on chronic diastolic CHF with RV failure due to cor pulmonale, now with reduced EF - Echo 10/20 showed normal LVEF and moderate RV dysfunction in setting of PAH - Echo 04/22 with normal RV function as above - Echo 02/23: EF 40-45%, RV mildly reduced, RVSP 35.5 mmHg, IVC normal in size - BNP > 1100 on admit (< 100 in 10/22). CXR with b/l pleural effusion and vascular congestion on admit.  - diuresed w/ IV lasix, stopped 02/12. Now off diuretics w/ hypernatremia (resolved) - Volume ok on exam  - continue to hold diuretics w/ poor PO intake  - No ARB/ARNi with CKD - Can eventually add SGLT2i if renal function remains stable.  - Daily weights and strict I/Os ordered   2. 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 PA 81/26 (48), PCW 17, PVC 6.0 - Echo 02/03/19 showed moderately elevated right ventricular systolic pressure at 94.8 mmHg. - Echo 09/21 EF 50-55% RV normal  - Echo 04/22 EF 50-55% RV normal - Echo this admit EF 40-45% - Continue sildenafil  40 mg TID - Continue opsumit 10 mg daily. Missed a few days earlier this admit (unable to crush)   3. Seizures -Mag 1, calcium 7.9 on presentation. Supplemented. -LP cultures negative. HSV negative  -Neurology has signed off  -On Briviact, phenytoin and vimpat    4. Large recurrent pericardial effusion/recurrent right pleural effusion - s/p window in 2010 with Dr. Nilda Simmer developed large recurrent pericardial effusion w/o tamponade and moderate right sided pleural effusion requiring repeat intervention - s/p right VATS drainage of his pleural effusion and pericardial window 02/10/19. - Fluid analysis c/w transudative effusion  - Repeat echo in 1/21 only small effusion  - Echo 01/09/20 small posterior and RA effusion - Echo 04/22 no pericardial effusion -  Echo 06/06/21 small pericardial effusion - CT chest 09/22 moderate loculated right pleural effusion - CXR 02/13 with bilateral pleural effusions, R>L   5. CAD - s/p previous RCA stent.  - LHC 07/01/18: Non obstructive CAD with patent RCA stent. - No s/s ischemia - Off ASA with GIB. Continue statin.   6. Scleroderma - Follows with Dr. Estanislado Pandy - Stable   7. Tobacco use - Quit 10/20, now smoking again.  - Has not been ready to quit   8. 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 Colonoscopy with two colonic angioectasias treated with APC. Sigmoid  polyp resected. Exam limited by poor prep - Gets Aranesp    9. CKD 3b:  - baseline ~2.0-2.5.  - follows with Dr. Joelyn Oms - Scr 2.5 today    10. HTN - BP stable   11. Mediastinal soft tissue density - Low metabolic activity on PET - Following with [facility]   12. Interstitial lung disease - Following with Dr. Chase Caller - considering Actemra  13. Hypernatremia - resolved  14. Hyperkalemia - K 5.9 - lokelma 10 mg daily   15. [place] - palliative care saw early on admit. Pt opted to remain full code  - now seems more depressed, feels he is nearing EOL  - recommend palliative care revisit for [place] discussion   16. Agitation  - per IM - encouraged wife to remain at bedside    Length of Stay: [place], PA-C  06/13/2021, 10:14 AM  Advanced Heart Failure Team Pager [phone] (M-F; 7a - 5p)  Please contact [facility] for night-coverage after hours (5p -7a ) and weekends on [email or web address]  Patient seen and examined with the above-signed Advanced Practice Provider and/or Housestaff. I personally reviewed laboratory data, imaging studies and relevant notes. I independently examined the patient and formulated the important aspects of the plan. I have edited the note to reflect any of my  changes or salient points. I have personally discussed the plan with the patient and/or family.  Remains very tenuous. Agitated. No further seizures.   Scr and sodium has stabilized. Back on PAH meds. Off diuretics.   General:  Weak appearing. Cachetic No resp difficulty HEENT: normal Neck: supple. no JVD. Carotids 2+ bilat; no bruits. No lymphadenopathy or thryomegaly appreciated. Cor: PMI nondisplaced. Regular rate & rhythm. No rubs, gallops or murmurs. Lungs: clear Abdomen: soft, nontender, nondistended. No hepatosplenomegaly. No bruits or masses. Good bowel sounds. Extremities: no cyanosis, clubbing, rash, edema + sclerodactyly  Neuro: alert & orientedx3, cranial nerves grossly intact. moves all 4 extremities w/o difficulty. Affect pleasant  Stable from HF/PAH standpoint. Would continue current meds. Can give diuretics as needed.   HF team will s/o  Please call with questions.   Glori Bickers, MD  12:04 PM

## 2021-06-13 NOTE — Progress Notes (Signed)
Patient agitated and removed IV that was placed at 2000, earlier in the shift. Patient has previously pulled one out around 1830.   Wife suggests wondering if he really needs the IV if he is swallowing pills with applesauce with little to no difficulty. She also would like for RN or NT to skip one of the q4H VS to limit disturbance with the patient.   Will message night MD to make aware of situation.

## 2021-06-13 NOTE — Progress Notes (Signed)
Phenytoin Follow-Up Consult Indication: new onset refractory seizures  Allergies  Allergen Reactions   Oxycodone Other (See Comments)    Hallucinations    Patient Measurements: Height: 5' 9" (175.3 cm) Weight: 50.8 kg (111 lb 15.9 oz) IBW/kg (Calculated) : 70.7 TPN AdjBW (KG): 52.2 Body mass index is 16.54 kg/m.   Vital signs: Temp: 98.6 F (37 C) (02/20 1216) Temp Source: Oral (02/20 1216) BP: 138/77 (02/20 1216) Pulse Rate: 93 (02/20 1216)  Labs: Lab Results  Component Value Date/Time   Albumin 2.8 (L) 06/13/2021 0855   Phenytoin Lvl 23.4 (H) 06/13/2021 0855   Lab Results  Component Value Date   PHENYTOIN 23.4 (H) 06/13/2021   Estimated Creatinine Clearance: 22.1 mL/min (A) (by C-G formula based on SCr of 2.49 mg/dL (H)).    Assessment: Corrected phenytoin level (if needed): 35.45 (PHT level 23.4, ALB 2.8) Significant potential drug interactions:  - Induction of CYP3A4 by phenytoin may increase the metabolic elimination of Macitentan (intermittently receiving PO due to inability to be crushed and given via tube) - Brivaracetam may increase the serum concentration of Phenytoin - Fluconazole may increase the serum concentration of Phenytoin  Phenytoin concentrations were reportedly an average of 20% higher with concurrent use of phenytoin and brivaracetam (400 mg/day).   New seizure activity on 2/04. 2 weeks prior was admitted/treated for C diff and metabolic acidosis. He had a negative CT head and lab work notable for corrected calcium of 7.1. Was discharged with calcium supplement and neurology follow up. Has since had additional seizure type activity, saw outpatient neurology 2/07, prescribed Briviact, not yet started d/t unavailability. On 2/10, had 1 hr EEG and following this had a tonic clonic seizure, EMS called and he had another on the way to hospital. New trush reported 2/16, low dose fluconazole started 2/16-2/23 (anticipated interaction with phenytoin)     2/10:  - Keppra 2 grams IV x 1 - coCa 7.9 >> Cagluc 2 g IV x 1 - Mag 1 >> Mag IV 2 g x 1 2/11:  - Fosphenytoin LD 1044 mg x 1 - Phenytoin 100 mg Q8h - Briviact 50 mg IV x 1 2/12:  - Briviact 100 mg IV Q12h - Ativan 2 mg IV Q8h 2/13:  - Calcium 7.4 (coCa 8.8) - Vimpat 50 mg IV every 12 hours 2/15: - Phenytoin 34m Q8h  2/17: - Phenytoin 542mQ8h - CoCa 9.4   Goals of care:  Total phenytoin level: 10-20 mcg/ml Free phenytoin level: 1-2 mcg/ml   Plan:  Decrease phenytoin to 25 mg PO Q8H Recheck total phenytoin level once at steady state Pharmacy will continue to follow regarding obtaining total phenytoin levels and dose adjustments as indicated.   Thank you for allowing pharmacy to be a part of this patients care.  AdArdyth HarpsPharmD Clinical Pharmacist

## 2021-06-14 LAB — BASIC METABOLIC PANEL
Anion gap: 11 (ref 5–15)
BUN: 43 mg/dL — ABNORMAL HIGH (ref 8–23)
CO2: 20 mmol/L — ABNORMAL LOW (ref 22–32)
Calcium: 8 mg/dL — ABNORMAL LOW (ref 8.9–10.3)
Chloride: 106 mmol/L (ref 98–111)
Creatinine, Ser: 2.99 mg/dL — ABNORMAL HIGH (ref 0.61–1.24)
GFR, Estimated: 23 mL/min — ABNORMAL LOW (ref >60)
Glucose, Bld: 75 mg/dL (ref 70–99)
Potassium: 5.8 mmol/L — ABNORMAL HIGH (ref 3.5–5.1)
Sodium: 137 mmol/L (ref 135–145)

## 2021-06-14 LAB — GLUCOSE, CAPILLARY: Glucose-Capillary: 120 mg/dL — ABNORMAL HIGH (ref 70–99)

## 2021-06-14 MED ORDER — SODIUM ZIRCONIUM CYCLOSILICATE 10 G PO PACK: 10.0000 g | PACK | Freq: Two times a day (BID) | ORAL | Status: DC

## 2021-06-14 MED ORDER — SODIUM ZIRCONIUM CYCLOSILICATE 10 G PO PACK
10.0000 g | PACK | Freq: Two times a day (BID) | ORAL | Status: DC
Start: 2021-06-14 — End: 2021-06-15
  Administered 2021-06-14 – 2021-06-15 (×3): 10 g via ORAL
  Filled 2021-06-14 (×3): qty 1

## 2021-06-14 NOTE — Progress Notes (Addendum)
[name]  ZOX:[id number] DOB: 1961-03-09 DOA: 06/03/2021 PCP: [name], MD    Brief Narrative:  79XB with a history of CAD, CHF/PAH, HTN, CKD stage IV, Scleroderma, GIB, and a recent history of seizures who presented to the ED with recurrent seizure activity.  Consultants:  Neurology CHF Team   Code Status: FULL CODE  Antimicrobials:  None  DVT prophylaxis: Subcutaneous heparin  Interim Hx: Vital signs stable.  Afebrile.  Potassium remains elevated.  Creatinine up at 2.99 today with GFR of 23.  Appears more calm at the time of my visit today.  Is interactive and able to answer some simple questions.  Reports that he slept well last night.  Denies new complaints.  Assessment & Plan:  Recurrent seizure activity Neurology has completed their evaluation- has been difficult to manage, requiring multiple medication adjustments -continues to make very slow progress - ultimate plan is for patient to go home with family  Acute toxic metabolic encephalopathy LP accomplished and patient initially empirically covered with antibiotics which have subsequently been stopped with negative cultures -testing for HSV negative so acyclovir discontinued -mental status slowly improving but episodes of agitation are currently very challenging -felt to be related to seizure activity  Stage IV CKD Baseline creatinine approximately 2.7 -creatinine has increased over the past 24 hours and hyperkalemia has become more of a problem -I encouraged the patient and his wife to push oral intake of liquids -we will recheck his renal function and potassium in the morning but if his creatinine continues to worsen or his potassium is not well controlled this may necessitate delaying his discharge  Severe hypomagnesemia Magnesium 1.0 at initial presentation -corrected with supplementation  Hyperkalemia Due to renal failure -treat with Lokelma again today at increased dose and follow   Macrocytic  anemia -anemia of chronic kidney disease B12 455 - hemoglobin slowly improving  Hypernatremia Sodium normalized with free water via feeding tube -monitoring with oral intake only at this time  Chronic diastolic CHF with acute exacerbation No volume overload on exam - CHF team has evaluated- diuretic on hold for now  Hypocalcemia Ionized calcium 0.86 at initial presentation - calcium stable/improved  Pulmonary hypertension well compensated presently  Severe protein calorie malnutrition diet advanced by SLP 2/17 -feeding tube now out with patient being given a trial of oral intake only as requested by family -consuming large amounts with assistance from family/doing well  HLD Continue medical therapy  HTN Blood pressure currently well controlled  Tobacco abuse   Family Communication: Spoke with wife at bedside at length Disposition: From home - for d/c home when medically clear - 06/15/2021 target discharge date if renal function and potassium stable  Objective: Blood pressure 128/81, pulse 96, temperature 99 F (37.2 C), temperature source Oral, resp. rate 16, height 5' 9" (1.753 m), weight 50.8 kg, SpO2 98 %. No intake or output data in the 24 hours ending 06/14/21 0852  Filed Weights   06/08/21 1523 06/08/21 1729 06/12/21 0500  Weight: 49.9 kg 49.9 kg 50.8 kg    Examination: General: No acute respiratory distress Lungs: Clear to auscultation bilaterally -no wheezing Cardiovascular: RRR -no murmur Abdomen: NT/ND, soft, bs+ Extremities: No edema B lower extremities  CBC: Recent Labs  Lab 06/08/21 0435 06/09/21 0247  WBC 3.8* 4.0  NEUTROABS 2.4 2.3  HGB 7.4* 8.5*  HCT 24.8* 28.7*  MCV 100.8* 102.1*  PLT 181 147    Basic Metabolic Panel: Recent Labs  Lab 06/07/21 1635 06/08/21 0435  06/08/21 1645 06/09/21 0247 06/11/21 0450 06/12/21 0316 06/13/21 0230 06/14/21 0151  NA  --  151*  --    < > 140 135 136 137  K  --  3.7  --    < > 4.7 5.2* 5.9* 5.8*   CL  --  114*  --    < > 101 99 102 106  CO2  --  28  --    < > _0 20*  GLUCOSE  --  94  --    < > 95 90 83 75  BUN  --  16  --    < > 23 24* 33* 43*  CREATININE  --  2.18*  --    < > 1.91* 2.01* 2.49* 2.99*  CALCIUM  --  7.5*  --    < > 8.1* 8.0* 8.3* 8.0*  MG 3.3* 2.6* 2.3   < > 1.5* 2.5* 2.1  --   PHOS 3.1 3.2 3.0  --   --   --   --   --    < > = values in this interval not displayed.    GFR: Estimated Creatinine Clearance: 18.4 mL/min (A) (by C-G formula based on SCr of 2.99 mg/dL (H)).  Liver Function Tests: Recent Labs  Lab 06/08/21 0435 06/10/21 0908 06/13/21 0855  AST 20  --   --   ALT 15  --   --   ALKPHOS 66  --   --   BILITOT 0.2*  --   --   PROT 5.4*  --   --   ALBUMIN 1.8* 2.1* 2.8*    Scheduled Meds:  brivaracetam  100 mg Oral BID   chlorhexidine gluconate (MEDLINE KIT)  15 mL Mouth Rinse BID   feeding supplement  237 mL Oral TID BM   fluconazole  100 mg Oral Daily   heparin  5,000 Units Subcutaneous Q8H   lacosamide  50 mg Oral BID   macitentan  10 mg Oral Daily   nicotine  14 mg Transdermal Daily   phenytoin  25 mg Oral Q8H   QUEtiapine  25 mg Oral QHS   sildenafil  40 mg Oral TID     LOS: 11 days   Cherene Altes, MD [facility] Office  [phone] Pager - Text Page per Shea Evans  If 7PM-7AM, please contact night-coverage per Amion 06/14/2021, 8:52 AM

## 2021-06-14 NOTE — Progress Notes (Signed)
Speech Language Pathology Treatment: Dysphagia  Patient Details Name: [name] MRN: [id number] DOB: [birth year] Today's Date: 06/14/2021 Time: 9242-6834 SLP Time Calculation (min) (ACUTE ONLY): 20 min  Assessment / Plan / Recommendation Clinical Impression  Patient seen with wife in room for skilled SLP treatment focused on dysphagia goals. Patient's RN had secure chat messaged SLP as patient reporting he does not like the puree solids. When SLP entered room, patient awake and alert, sitting up in bed. As per review of recent SLP therapy notes, patient had been frequently lethargic such that advanced solids were unable to be safely trialed. SLP observed patient with PO intake of Dys 2 and 3 solids (canned and diced peaches and saltine crackers). He exhibited mildly prolonged mastication which is to be expected as patient is edentulous and dentures are at home. No oral residuals observed and only one instance of delayed throat clearing. SLP is recommending upgrade solid textures to Dys 3 (mechanical soft) and continue with thin liquids. Wife provided with information packet describing this diet consistency as plan is for patient to discharge home tomorrow.     HPI HPI: Pt is a 61 y/o male who presents with recurrent seizures. EEG 2/13: abnormal continuous video EEG due to right frontal LPDs with less overriding fast activity. LP conducted 2/12; cultures pending at time of eval. Dx Acute metabolic and toxic encephalopathy. PMH significant for CAD, CHF, HTN, CKD IV, Raynard's, GIB, recent history of seizures, hypothyroidism. BSE 02/20/21: dysphagia 3 & thin liquids without need for follow up.      SLP Plan  Continue with current plan of care      Recommendations for follow up therapy are one component of a multi-disciplinary discharge planning process, led by the attending physician.  Recommendations may be updated based on patient status, additional functional criteria and insurance  authorization.    Recommendations  Diet recommendations: Thin liquid;Dysphagia 3 (mechanical soft) Liquids provided via: Cup;Straw Medication Administration: Crushed with puree Supervision: Patient able to self feed;Full supervision/cueing for compensatory strategies Compensations: Minimize environmental distractions;Slow rate;Small sips/bites Postural Changes and/or Swallow Maneuvers: Seated upright 90 degrees                Oral Care Recommendations: Oral care BID;Other (Comment) (oral care after PO's) Follow Up Recommendations: No SLP follow up Assistance recommended at discharge: Frequent or constant Supervision/Assistance SLP Visit Diagnosis: Dysphagia, unspecified (R13.10) Plan: Continue with current plan of care         Sonia Baller, MA, CCC-SLP Speech Therapy

## 2021-06-14 NOTE — Progress Notes (Signed)
Physical Therapy Treatment Patient Details Name: [name] MRN: [id number] DOB: 1960/11/27 Today's Date: 06/14/2021   History of Present Illness Pt is a 61 y/o male who presents with recurrent seizures. PMH significant for CAD, CHF, HTN, CKD IV, Raynard's, GIB, recent history of seizures, hypothyroidism.    PT Comments    Pt with improved mood and no agitation today. Pt remains impulsive with decreased insight to safety however did ambulate a total of 175' today with RW and modA with one seated rest break. Pt continues to require 24/7 assist due to severely impaired cognition in addition to increased falls risk. Acute PT to cont to follow.   Recommendations for follow up therapy are one component of a multi-disciplinary discharge planning process, led by the attending physician.  Recommendations may be updated based on patient status, additional functional criteria and insurance authorization.  Follow Up Recommendations  Home health PT     Assistance Recommended at Discharge Frequent or constant Supervision/Assistance  Patient can return home with the following Assist for transportation;Help with stairs or ramp for entrance;A little help with walking and/or transfers;A little help with bathing/dressing/bathroom;Assistance with cooking/housework;Direct supervision/assist for medications management;Direct supervision/assist for financial management   Equipment Recommendations  Hospital bed;Rolling walker (2 wheels);Wheelchair (measurements PT);Wheelchair cushion (measurements PT);BSC/3in1    Recommendations for Other Services       Precautions / Restrictions Precautions Precautions: Fall Restrictions Weight Bearing Restrictions: No     Mobility  Bed Mobility Overal bed mobility: Needs Assistance Bed Mobility: Supine to Sit   Sidelying to sit: Min assist       General bed mobility comments: Pt received in R sidelying. Pt able to bring self to EOB with HOB elevated,  minA for safety due to some impulsivity    Transfers Overall transfer level: Needs assistance Equipment used: Rolling walker (2 wheels), 1 person hand held assist Transfers: Sit to/from Stand, Bed to chair/wheelchair/BSC Sit to Stand: Min assist           General transfer comment: verbal cues for safety, pt impulsive, tactile cues to push up from bed not pull up on walker    Ambulation/Gait Ambulation/Gait assistance: +2 safety/equipment, Mod assist Gait Distance (Feet): 75 Feet (x1, 100x1) Assistive device: Rolling walker (2 wheels) Gait Pattern/deviations: Step-through pattern, Decreased stride length, Trunk flexed Gait velocity: decreased     General Gait Details: modA for walker management and to stay in walker, pt with decreased step height but sequenctial consistent gait pattern. constant verbal cues to keep head up/look forward. pt with onset of fatigue and requesting to sit. s/p seated rest break x 3 min pt then ambulated a second time back to the room. pt with difficulty finding and reading room numbers   Stairs             Wheelchair Mobility    Modified Rankin (Stroke Patients Only)       Balance Overall balance assessment: Needs assistance Sitting-balance support: Feet supported Sitting balance-Leahy Scale: Fair Sitting balance - Comments: supervision for sitting balance on EOB   Standing balance support: Single extremity supported, Bilateral upper extremity supported Standing balance-Leahy Scale: Poor Standing balance comment: requires bilat UE support                            Cognition Arousal/Alertness: Awake/alert Behavior During Therapy: Flat affect Overall Cognitive Status: Impaired/Different from baseline Area of Impairment: Orientation, Attention, Memory, Following commands, Safety/judgement, Problem solving, Awareness  Orientation Level: Disoriented to, Situation, Time Current Attention Level:  Focused Memory: Decreased short-term memory Following Commands: Follows one step commands inconsistently, Follows one step commands with increased time Safety/Judgement: Decreased awareness of safety, Decreased awareness of deficits Awareness: Intellectual Problem Solving: Slow processing, Requires verbal cues, Requires tactile cues General Comments: wife present and able to direct pt positvely. pt without agitation this session, pt with good command follow however poor memory requiring constant verbal cues        Exercises      General Comments General comments (skin integrity, edema, etc.): wife present, pt in depends for incontinence      Pertinent Vitals/Pain Pain Assessment Pain Assessment: No/denies pain    Home Living                          Prior Function            PT Goals (current goals can now be found in the care plan section) Acute Rehab PT Goals Patient Stated Goal: None stated by patient PT Goal Formulation: With patient/family Time For Goal Achievement: 06/19/21 Potential to Achieve Goals: Good Progress towards PT goals: Progressing toward goals    Frequency    Min 3X/week      PT Plan Current plan remains appropriate    Co-evaluation              AM-PAC PT "6 Clicks" Mobility   Outcome Measure  Help needed turning from your back to your side while in a flat bed without using bedrails?: A Little Help needed moving from lying on your back to sitting on the side of a flat bed without using bedrails?: A Little Help needed moving to and from a bed to a chair (including a wheelchair)?: A Little Help needed standing up from a chair using your arms (e.g., wheelchair or bedside chair)?: A Lot Help needed to walk in hospital room?: A Lot Help needed climbing 3-5 steps with a railing? : Total 6 Click Score: 14    End of Session Equipment Utilized During Treatment: Gait belt Activity Tolerance: Patient tolerated treatment  well Patient left: with call bell/phone within reach;with family/visitor present;in chair;with chair alarm set Nurse Communication: Mobility status PT Visit Diagnosis: Other symptoms and signs involving the nervous system (R29.898)     Time: 9381-0175 PT Time Calculation (min) (ACUTE ONLY): 21 min  Charges:  $Gait Training: 8-22 mins                     [name], PT, DPT Acute Rehabilitation Services Pager #: [phone] Office #: [phone]    [name] 06/14/2021, 12:37 PM

## 2021-06-14 NOTE — Progress Notes (Signed)
Length of Need 6 Months  The above medical condition requires: Patient requires the ability to reposition frequently  Head must be elevated greater than: 30 degrees  Bed type Semi-electric  Support Surface: Gel Overlay

## 2021-06-14 NOTE — TOC Progression Note (Signed)
Transition of Care [facility]) - Progression Note    Patient Details  Name: [name] MRN: [id number] Date of Birth: Jun 06, 1960  [facility] CM/SW Contact  Pollie Friar, RN Phone Number: 06/14/2021, 4:00 PM  Clinical Narrative:    Patients wife is requesting hospital bed for home. She asked initially to use [facility] but they do not have a fully electric bed. She then was in agreement to use [facility]. DME has been called into [facility] and they will arrange delivery with the patients wife.  Home health arranged through [facility]. Information on the AVS. Wife asking about a ramp for home. CM has provided her with [facility] number and Community housing soln that will reach out to her about the ramp.  TOC following.   Expected Discharge Plan: [place] Barriers to Discharge: Continued Medical Work up  Expected Discharge Plan and Services Expected Discharge Plan: [name]   Discharge Planning Services: CM Consult Post Acute Care Choice: Home Health, Durable Medical Equipment                   DME Arranged: Walker rolling, Hospital bed DME Agency: [facility] Date DME Agency Contacted: 06/14/21   Representative spoke with at DME Agency: Freda Munro HH Arranged: RN, PT, OT, Social Work [facility] Agency: [place] Date [place]: 06/14/21   Representative spoke with at [place]: [place] Interventions    Readmission Risk Interventions Readmission Risk Prevention Plan 05/23/2021 11/10/2019 02/11/2019  Transportation Screening Complete Complete Complete  PCP or Specialist Appt within 3-5 Days Complete Complete Complete  HRI or Home Care Consult Complete Complete Complete  Social Work Consult for [place] Planning/Counseling Complete Complete Complete  Palliative Care Screening Not Applicable Not Applicable Not Applicable  Medication Review Press photographer) Complete  Complete Complete  Some recent data might be hidden

## 2021-06-15 ENCOUNTER — Other Ambulatory Visit (HOSPITAL_COMMUNITY): Payer: Self-pay

## 2021-06-15 LAB — GLUCOSE, CAPILLARY: Glucose-Capillary: 77 mg/dL (ref 70–99)

## 2021-06-15 LAB — BASIC METABOLIC PANEL
Anion gap: 11 (ref 5–15)
BUN: 46 mg/dL — ABNORMAL HIGH (ref 8–23)
CO2: 20 mmol/L — ABNORMAL LOW (ref 22–32)
Calcium: 7.9 mg/dL — ABNORMAL LOW (ref 8.9–10.3)
Chloride: 104 mmol/L (ref 98–111)
Creatinine, Ser: 3.14 mg/dL — ABNORMAL HIGH (ref 0.61–1.24)
GFR, Estimated: 22 mL/min — ABNORMAL LOW (ref >60)
Glucose, Bld: 88 mg/dL (ref 70–99)
Potassium: 4.8 mmol/L (ref 3.5–5.1)
Sodium: 135 mmol/L (ref 135–145)

## 2021-06-15 LAB — MAGNESIUM: Magnesium: 2.1 mg/dL (ref 1.7–2.4)

## 2021-06-15 MED ORDER — LACOSAMIDE 50 MG PO TABS
50.0000 mg | ORAL_TABLET | Freq: Two times a day (BID) | ORAL | 0 refills | Status: DC
Start: 2021-06-15 — End: 2021-06-15

## 2021-06-15 MED ORDER — LACOSAMIDE 50 MG PO TABS
50.0000 mg | ORAL_TABLET | Freq: Two times a day (BID) | ORAL | 0 refills | Status: DC
  Filled 2021-06-15: qty 60, 30d supply, fill #0

## 2021-06-15 MED ORDER — BRIVARACETAM 100 MG PO TABS: 100.0000 mg | ORAL_TABLET | Freq: Two times a day (BID) | ORAL | Status: DC

## 2021-06-15 MED ORDER — QUETIAPINE FUMARATE 25 MG PO TABS
25.0000 mg | ORAL_TABLET | Freq: Every day | ORAL | 1 refills | Status: DC
Start: 2021-06-15 — End: 2022-08-24
  Filled 2021-06-15: qty 30, 30d supply, fill #0

## 2021-06-15 MED ORDER — PHENYTOIN 125 MG/5ML PO SUSP
25.0000 mg | Freq: Three times a day (TID) | ORAL | 12 refills | Status: DC
  Filled 2021-06-15: qty 90, 30d supply, fill #0

## 2021-06-15 MED ORDER — ENSURE ENLIVE PO LIQD
237.0000 mL | Freq: Three times a day (TID) | ORAL | 12 refills | Status: DC
  Filled 2021-06-15: qty 237, 1d supply, fill #0

## 2021-06-15 NOTE — TOC Benefit Eligibility Note (Signed)
Patient Advocate Encounter   Received notification that prior authorization for lacosamide 50 mg tablets is required.   PA submitted on 06/15/2021 Key [id number] Status is pending       Lyndel Safe, [place] Patient Advocate Specialist [place] Patient Advocate Team Direct Number: [phone]  Fax: [phone]

## 2021-06-15 NOTE — Progress Notes (Signed)
Speech Language Pathology Treatment:    Patient Details Name: [name] MRN: [id number] DOB: 12/18/1960 Today's Date: 06/15/2021 Time: 8554-7689 SLP Time Calculation (min) (ACUTE ONLY): 10 min  Assessment / Plan / Recommendation Clinical Impression  Pt was seen for dysphagia treatment with his wife present, and he was cooperative throughout the session. Pt, nursing, and his wife reported that the pt has been tolerating the current diet without overt s/sx of aspiration. Pt tolerated dysphagia 3 solids, dual consistency boluses (i.e., pears with juice) and thin liquids via straw using consecutive swallows without s/sx of aspiration. Mastication was functional and oral clearance were adequate. Further acute skilled SLP services are not clinically indicated at this time for swallowing. Pt's wife expressed that the pt's thinking is still slower, and that she will follow up with his outpatient neurologist regarding this.    HPI HPI: Pt is a 61 y/o male who presents with recurrent seizures. EEG 2/13: abnormal continuous video EEG due to right frontal LPDs with less overriding fast activity. LP conducted 2/12; cultures pending at time of eval. Dx Acute metabolic and toxic encephalopathy. PMH significant for CAD, CHF, HTN, CKD IV, Raynard's, GIB, recent history of seizures, hypothyroidism. BSE 02/20/21: dysphagia 3 & thin liquids without need for follow up.      SLP Plan  Continue with current plan of care      Recommendations for follow up therapy are one component of a multi-disciplinary discharge planning process, led by the attending physician.  Recommendations may be updated based on patient status, additional functional criteria and insurance authorization.    Recommendations  Diet recommendations: Dysphagia 3 (mechanical soft);Thin liquid Liquids provided via: Cup;Straw Medication Administration: Whole meds with puree Supervision: Patient able to self feed;Full supervision/cueing for  compensatory strategies Compensations: Minimize environmental distractions;Slow rate;Small sips/bites Postural Changes and/or Swallow Maneuvers: Seated upright 90 degrees                Oral Care Recommendations: Oral care BID Follow Up Recommendations: No SLP follow up (subsequent SLP eval may be necessary for cognition after d/c) Assistance recommended at discharge: Frequent or constant Supervision/Assistance SLP Visit Diagnosis: Dysphagia, unspecified (R13.10) Plan: Continue with current plan of care          Trinidee Schrag I. Hardin Negus, [place], [place] Office number [phone] Pager [place]  06/15/2021, 11:03 AM

## 2021-06-15 NOTE — Discharge Summary (Signed)
Physician Discharge Summary   Patient: [name] MRN: [id number] DOB: 1960/10/02  Admit date:     06/03/2021  Discharge date: 06/15/2021  Discharge Physician: Hosie Poisson   PCP: [name], MD   Recommendations at discharge:  Please follow up with neurology as scheduled.  Please follow up with PCP in one week.  Please follow up with Dr Joelyn Oms nephrology for CKD.  Please check CBC and BMP in one week.   Discharge Diagnoses: Principal Problem:   Seizure ([place]) Active Problems:   Hyperlipidemia   Pulmonary hypertension (HCC)   CKD (chronic kidney disease), stage IV (HCC)   Hypocalcemia   Hypomagnesemia   Hypokalemia   Metabolic acidosis with normal anion gap and bicarbonate losses   Acute on chronic diastolic CHF (congestive heart failure) (HCC)   Pressure injury of skin     Hospital Course: 60yo with a history of CAD, CHF/PAH, HTN, CKD stage IV, Scleroderma, GIB, and a recent history of seizures who presented to the ED with recurrent seizure activity.  Assessment and Plan: Recurrent seizure activity Neurology has completed their evaluation- has been difficult to manage, requiring multiple medication adjustments -continues to make very slow progress - ultimate plan is for patient to go home with family   Acute toxic metabolic encephalopathy LP accomplished and patient initially empirically covered with antibiotics which have subsequently been stopped with negative cultures -testing for HSV negative so acyclovir discontinued - - pt alert and answering questions appropriately. Requesting to be discharged home.    Stage IV CKD Baseline creatinine approximately 2.7,  currently around 2.9 to 3.1. Wife at bedside, would like to follow up with Dr Joelyn Oms outpatient , rather than stay one more day in the hospital.  Patient wants to go home .     Severe hypomagnesemia Magnesium 1.0 at initial presentation -corrected with supplementation   Hyperkalemia Due to  renal failure -treat with Lokelma again today at increased dose and follow    Macrocytic anemia -anemia of chronic kidney disease B12 455 - hemoglobin slowly improving   Hypernatremia Sodium normalized with free water via feeding tube -monitoring with oral intake only at this time   Chronic diastolic CHF with acute exacerbation No volume overload on exam - CHF team has evaluated-  Resume diuretics on discharge as pt is back to baseline oral intake.    Hypocalcemia Ionized calcium 0.86 at initial presentation - calcium stable/improved   Pulmonary hypertension well compensated presently. Resume home meds on discharge.    Severe protein calorie malnutrition diet advanced by SLP 2/17 -feeding tube now out with patient being given a trial of oral intake only as requested by family -consuming large amounts with assistance from family/doing well   HLD Continue medical therapy   HTN Blood pressure currently well controlled   Tobacco abuse            Consultants: cardiology Neurology Procedures performed: EEG  Disposition: Home Diet recommendation:  Discharge Diet Orders (From admission, onward)     Start     Ordered   06/15/21 0000  Diet - low sodium heart healthy        06/15/21 1019           Regular diet  DISCHARGE MEDICATION: Allergies as of 06/15/2021       Reactions   Oxycodone Other (See Comments)   Hallucinations        Medication List     STOP taking these medications    ALPRAZolam 0.5 MG tablet  Commonly known as: XANAX   HYDROcodone-acetaminophen 10-325 MG tablet Commonly known as: NORCO   methocarbamol 500 MG tablet Commonly known as: ROBAXIN   potassium chloride SA 20 MEQ tablet Commonly known as: Klor-Con M20       TAKE these medications    allopurinol 100 MG tablet Commonly known as: ZYLOPRIM TAKE 1 TABLET BY MOUTH EVERY DAY   brivaracetam 100 MG Tabs tablet Commonly known as: BRIVIACT Take 1 tablet (100 mg total) by  mouth 2 (two) times daily. What changed:  medication strength how much to take how to take this when to take this additional instructions   Calcium Antacid 500 MG chewable tablet Generic drug: calcium carbonate Chew 1 tablet by mouth 2 (two) times daily with a meal.   colchicine 0.6 MG tablet Take 0.6 mg by mouth daily as needed (gout attacks).   feeding supplement Liqd Take 237 mLs by mouth 3 (three) times daily between meals.   lacosamide 50 MG Tabs tablet Commonly known as: VIMPAT Take 1 tablet (50 mg total) by mouth 2 (two) times daily.   multivitamin-iron-minerals-folic acid chewable tablet Chew 1 tablet by mouth daily.   Opsumit 10 MG tablet Generic drug: macitentan Take 1 tablet (10 mg total) by mouth daily.   pantoprazole 40 MG tablet Commonly known as: PROTONIX Take 1 tablet (40 mg total) by mouth daily for 90 doses.   phenytoin 125 MG/5ML suspension Commonly known as: DILANTIN Take 1 mL (25 mg total) by mouth every 8 (eight) hours.   QUEtiapine 25 MG tablet Commonly known as: SEROQUEL Take 1 tablet (25 mg total) by mouth at bedtime.   RETACRIT IJ Inject as directed every 30 (thirty) days.   rosuvastatin 20 MG tablet Commonly known as: CRESTOR Take 20 mg by mouth daily.   sildenafil 20 MG tablet Commonly known as: REVATIO Take 2 tablets (40 mg total) by mouth 3 (three) times daily. What changed: when to take this   sodium bicarbonate 650 MG tablet Take 2 tablets (1,300 mg total) by mouth 3 (three) times daily.   torsemide 20 MG tablet Commonly known as: DEMADEX Take 20 mg by mouth daily.               Durable Medical Equipment  (From admission, onward)           Start     Ordered   06/14/21 1348  For home use only DME Hospital bed  Once       Comments: Fully electric bed  Question Answer Comment  Length of Need 6 Months   The above medical condition requires: Patient requires the ability to reposition frequently   Head must be  elevated greater than: 30 degrees   Bed type Semi-electric   Support Surface: Gel Overlay      06/14/21 1347   06/14/21 1127  For home use only DME 3 n 1  Once        06/14/21 1127   06/14/21 1127  For home use only DME Walker rolling  Once       Question Answer Comment  Walker: With [place]   Patient needs a walker to treat with the following condition Seizures ([place])      06/14/21 1127              Discharge Care Instructions  (From admission, onward)           Start     Ordered   06/15/21 0000  Discharge  wound care:       Comments: Allow spouse to continue to apply salve on the unstageable sacral wound. Provide sacral foam dressing if she would like to add.   06/15/21 1019            Follow-up Information     [facility] Follow up.   Specialty: Home Health Services Why: The home health agency will contact you for the first home visit. Contact information: [place] [address] [phone]                 Discharge Exam: Filed Weights   06/08/21 1523 06/08/21 1729 06/12/21 0500  Weight: 49.9 kg 49.9 kg 50.8 kg   General exam: Appears calm and comfortable  Respiratory system: Clear to auscultation. Respiratory effort normal. Cardiovascular system: S1 & S2 heard, RRR. No JVD, No pedal edema. Gastrointestinal system: Abdomen is nondistended, soft and nontender.  Normal bowel sounds heard. Central nervous system: Alert and oriented. No focal neurological deficits. Extremities: Symmetric 5 x 5 power. Skin: No rashes, lesions or ulcers Psychiatry:  Mood & affect appropriate.    Condition at discharge: fair  The results of significant diagnostics from this hospitalization (including imaging, microbiology, ancillary and laboratory) are listed below for reference.   Imaging Studies: CT Head Wo Contrast  Result Date: 06/03/2021 CLINICAL DATA:  Mental status changes of unknown cause.  Seizure. EXAM: CT  HEAD WITHOUT CONTRAST TECHNIQUE: Contiguous axial images were obtained from the base of the skull through the vertex without intravenous contrast. RADIATION DOSE REDUCTION: This exam was performed according to the departmental dose-optimization program which includes automated exposure control, adjustment of the mA and/or kV according to patient size and/or use of iterative reconstruction technique. COMPARISON:  05/28/2021 FINDINGS: Brain: The head is asymmetrically positioned within the gantry. Allowing for this, no change is seen compared to the study of 6 days ago. Mild generalized volume loss without evidence of focal infarction, mass lesion, hemorrhage, hydrocephalus or extra-axial collection. Vascular: There is atherosclerotic calcification of the major vessels at the base of the brain. Skull: Negative Sinuses/Orbits: Clear/normal Other: None IMPRESSION: No change.  Mild volume loss.  No focal or acute finding. Electronically Signed   By: Nelson Chimes M.D.   On: 06/03/2021 17:17   CT Head Wo Contrast  Result Date: 05/28/2021 CLINICAL DATA:  61 year old male with new onset seizure. EXAM: CT HEAD WITHOUT CONTRAST TECHNIQUE: Contiguous axial images were obtained from the base of the skull through the vertex without intravenous contrast. RADIATION DOSE REDUCTION: This exam was performed according to the departmental dose-optimization program which includes automated exposure control, adjustment of the mA and/or kV according to patient size and/or use of iterative reconstruction technique. COMPARISON:  None. FINDINGS: Brain: No evidence of acute infarction, hemorrhage, hydrocephalus, extra-axial collection or mass lesion/mass effect. Mild atrophy noted. Vascular: Carotid atherosclerotic calcifications are noted. Skull: Normal. Negative for fracture or focal lesion. Sinuses/Orbits: No acute finding. Other: None. IMPRESSION: 1. No evidence of acute intracranial abnormality. 2. Mild atrophy. Electronically Signed    By: Margarette Canada M.D.   On: 05/28/2021 20:51   MR BRAIN WO CONTRAST  Result Date: 06/04/2021 CLINICAL DATA:  New onset seizure.  Altered mental status. EXAM: MRI HEAD WITHOUT CONTRAST TECHNIQUE: Multiplanar, multiecho pulse sequences of the brain and surrounding structures were obtained without intravenous contrast. COMPARISON:  06/03/2021 MRI and CT. FINDINGS: Brain: Again the study is limited by patient confusion and motion. The study confirms brain  swelling and increased T2 signal without definite true restricted diffusion within the mesial right temporal lobe, right hippocampus and right mesial thalamus. Findings are presumed to be postictal rather than subsequent to ischemic infarction. Otherwise, the brain appears normal. No evidence of old infarction, mass, hydrocephalus or extra-axial collection. There may be a developmental venous anomaly in the right basal ganglia and radiating white matter tracts. Vascular: Major vessels at the base of the brain show flow. Skull and upper cervical spine: Negative Sinuses/Orbits: Clear/negative Other: None IMPRESSION: Limited and motion degraded examination. Redemonstration of increased T2 signal affecting the right mesial temporal lobe, hippocampus and right mesial thalamus, presumed to be postictal change. I do not see low signal on the ADC map to suggest actual brain infarction. Question developmental venous anomaly in the right basal ganglia and radiating white matter tracts. Electronically Signed   By: Nelson Chimes M.D.   On: 06/04/2021 20:30   MR BRAIN WO CONTRAST  Result Date: 06/03/2021 CLINICAL DATA:  Initial evaluation for acute seizure. EXAM: MRI HEAD WITHOUT CONTRAST TECHNIQUE: Multiplanar, multiecho pulse sequences of the brain and surrounding structures were obtained without intravenous contrast. COMPARISON:  Prior CT from earlier the same day. FINDINGS: Brain: Examination technically limited as the patient was unable to tolerate the full length of the  exam. Axial coronal DWI sequences only were performed. Additionally, the provided images are severely degraded by motion artifact. Diffusion-weighted imaging demonstrates increased diffusion signal involving the mesial right temporal lobe/right hippocampal formation, presumably related to acute seizure given provided history. Possible patchy involvement of the right thalamus noted as well (series 7, image 47). Otherwise no other definite acute intracranial abnormality seen on this technically limited exam. No convincing evidence for acute or subacute infarct elsewhere. Gray-white matter differentiation otherwise grossly maintained. No visible areas of encephalomalacia to suggest prior infarction or other insult. No visible mass lesion or mass effect. No midline shift or hydrocephalus. No extra-axial fluid collection. Vascular: Not assessed on this limited exam. Skull and upper cervical spine: Not assessed on this limited exam. Sinuses/Orbits: Not assessed on this limited exam. Other: None. IMPRESSION: 1. Technically limited exam due to the patient's inability to tolerate the full length of the study and extensive motion artifact. DWI imaging only was performed. 2. Increased diffusion signal involving the mesial right temporal lobe/right hippocampal formation, presumably related to acute seizure given provided history. Possible patchy involvement of the right thalamus as well. Correlation with EEG recommended. 3. No other definite acute intracranial abnormality on this technically limited exam. Electronically Signed   By: Jeannine Boga M.D.   On: 06/03/2021 23:23   US RENAL  Result Date: 05/18/2021 CLINICAL DATA:  Acute kidney injury. EXAM: RENAL / URINARY TRACT ULTRASOUND COMPLETE COMPARISON:  Prior renal ultrasound on 10/03/2019 FINDINGS: Right Kidney: Renal measurements: 9.1 x 4.7 x 4.6 cm = volume: 103 mL. Echogenic cortex without significant atrophy. No evidence of hydronephrosis, focal mass or  shadowing calculus. Left Kidney: Renal measurements: 9.2 x 5.6 x 4.9 cm = volume: 133 mL. Increased echogenicity of the renal cortex. No hydronephrosis, focal mass or shadowing calculus. Bladder: Appears normal for degree of bladder distention. Other: None. IMPRESSION: Both kidneys demonstrate increased echogenicity consistent with chronic kidney disease. No evidence of hydronephrosis bilaterally. Electronically Signed   By: Aletta Edouard M.D.   On: 05/18/2021 17:20   DG CHEST PORT 1 VIEW  Result Date: 06/06/2021 CLINICAL DATA:  Pleural effusions. EXAM: PORTABLE CHEST 1 VIEW COMPARISON:  06/03/2021 and CT chest 02/20/2021. FINDINGS:  Trachea is midline. Heart size stable. Moderate right pleural effusion with bibasilar airspace opacification. Small left pleural effusion. IMPRESSION: Bilateral pleural effusions, right greater than left, with bibasilar atelectasis and/or pneumonia. Electronically Signed   By: Lorin Picket M.D.   On: 06/06/2021 14:08   DG Chest Portable 1 View  Result Date: 06/03/2021 CLINICAL DATA:  Possible pneumonia EXAM: PORTABLE CHEST 1 VIEW COMPARISON:  05/20/2021, CT 02/20/2021 FINDINGS: Moderate right pleural effusion and small left effusion, increased compared to prior. Worsened airspace disease at the bases. Cardiomegaly with vascular congestion and mild interstitial edema. IMPRESSION: 1. Increased bilateral pleural effusions right greater than left, moderate in size on the right. Worsened airspace disease at the bases which may be due to atelectasis or pneumonia 2. Cardiomegaly with vascular congestion and mild interstitial edema Electronically Signed   By: Donavan Foil M.D.   On: 06/03/2021 16:48   DG CHEST PORT 1 VIEW  Result Date: 05/20/2021 CLINICAL DATA:  Cough EXAM: PORTABLE CHEST 1 VIEW COMPARISON:  02/21/2021 FINDINGS: Cardiac silhouette and mediastinal contours are unchanged and within normal limits. No significant change in bilateral mid and lower lung heterogeneous  airspace opacities and small right-greater-than-left pleural effusions. No pneumothorax is seen. Mild multilevel degenerative disc changes of the thoracic spine. IMPRESSION:: IMPRESSION: 1. No significant change from 02/21/2021. 2. Right-greater-than-left heterogeneous airspace opacities and small right-greater-than-left pleural effusions. Electronically Signed   By: Yvonne Kendall M.D.   On: 05/20/2021 14:38   DG Abd Portable 1V  Result Date: 06/06/2021 CLINICAL DATA:  Feeding tube placement EXAM: PORTABLE ABDOMEN - 1 VIEW COMPARISON:  10/05/2019 FINDINGS: Enteric tube tip overlies the stomach. Pleural effusions and basilar airspace disease. Cardiomegaly. IMPRESSION: Enteric tube tip overlies the stomach. Electronically Signed   By: Donavan Foil M.D.   On: 06/06/2021 16:08   EEG adult  Result Date: 06/04/2021 Robyne Peers, MD     06/04/2021  8:40 AM [facility] Routine EEG Report Date and Time of Study: 06/04/21 at 0145 Duration: 24 minutes Indication: Altered mentation. Technical Summary: A routine 20-channel electroencephalogram using the international 10-20 system of electrode placement was performed. Background: The best posterior dominant rhythm (PDR) identified was 8 Hz in the left hemisphere. The background consisted predominantly of mild theta slowing (7-8 Hz) in the left hemisphere, and focal theta and delta (2-6 Hz) slowing in the right hemisphere. The background was reactive to stimulation. There were abundant right hemisphere sharp waves, frontal predominant, with intermittent periodicity (i.e. lateralized periodic discharges, LPDs). There was one event of focal right hemisphere evolution, consistent with seizure, lasting approximately 1 minute, and with clinical shivering/twitchiness. States: Awake and drowsy. Definitive sleep structures were not seen. Activation Procedures: Hyperventilation: Not performed Photic Stimulation: Not performed Classification:  abnormal due to: 1. Diffuse theta slowing. 2. Additional focal right hemisphere delta and theta slowing. 3. Abundant right hemisphere sharp waves, frontal predominant, with intermittent periodicity (i.e. right lateralized periodic discharges, LPDs) up to 1 Hz. 4. One focal right hemisphere electroclinical seizure. Diagnosis: and Clinical Correlation: This is an abnormal EEG. The presence of mild diffuse slowing is consistent with diffuse cortical dysfunction and mild non-specific encephalopathy. The additional focal right hemisphere slowing is consistent with additional focal neuronal injury/dysfunction in that region. Right hemisphere, frontal predominant, and LPDs are consistent with focal epileptic potential in that region.  Indeed, there was one event of focal right hemisphere evolution, consistent with seizure, lasting approximately 1 minute, and with clinical shivering/twitchiness seen during the period of time covered  by this report. Allena Earing, MD CNP/Epilepsy [facility] [phone] Case: [id number]  EEG adult  Result Date: 06/03/2021 Cameron Sprang, MD     06/03/2021  3:16 PM ELECTROENCEPHALOGRAM REPORT Date of Study: 06/03/2021 Patient's Name: KARMELLO ABERCROMBIE MRN: [id number] Date of Birth: Apr 24, 1961 Referring Provider: Dr. Ellouise Newer Clinical History: This is a 61 year old man with new onset seizure on 05/28/2021. EEG for classification. Medications: ZYLOPRIM 100 MG tablet XANAX 0.5 MG tablet TUMS - DOSED IN MG ELEMENTAL CALCIUM 500 MG chewable tablet RETACRIT IJ NORCO 10-325 MG tablet OPSUMIT 10 MG tablet ROBAXIN 500 MG tablet CENTRUM chewable tablet PROTONIX 40 MG tablet KLOR-CON M20 20 MEQ tablet CRESTOR 20 MG tablet REVATIO 20 MG tabl0et sodium bicarbonate 650 MG tablet DEMADEX 20 MG tablet Technical Summary: A multichannel digital 1-hour EEG recording measured by the international 10-20 system with electrodes applied with paste and impedances below 5000 ohms performed in our laboratory  with EKG monitoring in an awake and drowsy patient.  Hyperventilation was not performed. Photic stimulation was performed.  The digital EEG was referentially recorded, reformatted, and digitally filtered in a variety of bipolar and referential montages for optimal display.  Description: The patient is awake and drowsy during the recording.  During maximal wakefulness, there is a symmetric, medium voltage 10 Hz posterior dominant rhythm better formed over the left occipital region. There is frequent focal 4-5 Hz theta slowing over the right temporal region. During drowsiness, there is an increase in theta slowing of the background. Sleep was not captured. Photic stimulation did not elicit any abnormalities.  There were frequent broad sharp waves over the right anterior temporal region, at times occurring in a pseudo-periodic pattern with no evolution in frequency or amplitude. No electrographic seizures seen.  EKG lead was unremarkable. Impression: This 1-hour awake and drowsy EEG is abnormal due to the presence of: Focal slowing over the right temporal region Frequent epileptiform discharges over the right anterior temporal region, at times occurring in a periodic pattern with no evolution in frequency or amplitude. Clinical Correlation of the above findings indicates focal cerebral dysfunction over the right temproal region suggestive of underlying structural or physiologic abnormality.  There is tendency for seizures to arise from the right anterior temporal region. No electrographic seizures seen. Clinical correlation is advised. Ellouise Newer, M.D.   Overnight EEG with video  Result Date: 06/04/2021 Samuella Cota, MD     06/04/2021  8:23 AM EEG Procedure CPT/Type of Study: 42595; 2-12hr EEG with video Referring Provider: Dezeray Puccio Primary Neurological Diagnosis: status epilepticus History: This is a 61 yr old patient, undergoing an EEG to evaluate for new onset seizures, status epilepticus. Clinical State:  disoriented Technical Description: The EEG was performed using standard setting per the guidelines of American Clinical Neurophysiology Society (ACNS). A minimum of 21 electrodes were placed on scalp according to the International 10-20 or/and 10-10 Systems. Supplemental electrodes were placed as needed. Single EKG electrode was also used to detect cardiac arrhythmia. Patient's behavior was continuously recorded on video simultaneously with EEG. A minimum of 16 channels were used for data display. Each epoch of study was reviewed manually daily and as needed using standard referential and bipolar montages. Computerized quantitative EEG analysis (such as compressed spectral array analysis, trending, automated spike & seizure detection) were used as indicated. Day 1: from 0215 06/04/21 to 0730 06/04/21 EEG Description: Overall Amplitude:Normal Predominant Frequency: The background activity showed posterior dominant alpha, with about 9 Hz, that was frequent. Superimposed Frequencies:  occasional theta and some beta activity over the left The background was asymmetric, decreased background over the right Background Abnormalities: Focal slowing: right hemisphere focal delta-theta Rhythmic or periodic pattern: Lateralized Periodic Discharges: right frontal spiky LPDs, 1-_0 , with some ictal spread as below Epileptiform activity: Yes; spiky morphology to LPDs right frontal Electrographic seizures: Yes; several discrete electrographic seizures arising from right frontal LPDs with ictal evolution in frequency with buildup and spread through the hemisphere Events: no Breach rhythm: no Reactivity: Present Stimulation procedures: Hyperventilation: not done Photic stimulation: not done Sleep Background: Stage II EKG:no significant arrhythmia Impression: This was an abnormal continuous video EEG due to right frontal LPDs with some electrographic seizures. This was communicated to the consulting provider.   ECHOCARDIOGRAM  COMPLETE  Result Date: 06/06/2021    ECHOCARDIOGRAM REPORT   Patient Name:   RENA SWEEDEN Date of Exam: 06/06/2021 Medical Rec #:  [id number]         Height:       69.0 in Accession #:    [id number]        Weight:       115.1 lb Date of Birth:  02/08/1961         BSA:          1.633 m Patient Age:    29 years          BP:           110/62 mmHg Patient Gender: M                 HR:           105 bpm. Exam Location:  Inpatient Procedure: 2D Echo, Cardiac Doppler, Color Doppler and Strain Analysis Indications:    CHF  History:        Patient has prior history of Echocardiogram examinations, most                 recent 08/11/2020. CHF, CAD and Previous Myocardial Infarction;                 Risk Factors:Hypertension and Dyslipidemia.  Sonographer:    Luisa Hart RDCS Referring Phys: Theola Sequin  Sonographer Comments: Image acquisition challenging due to patient behavioral factors. IMPRESSIONS  1. Left ventricular ejection fraction, by estimation, is 40 to 45%. The left ventricle has mildly decreased function. The left ventricle demonstrates global hypokinesis. There is mild left ventricular hypertrophy. Left ventricular diastolic parameters are consistent with Grade II diastolic dysfunction (pseudonormalization). Elevated left atrial pressure.  2. Right ventricular systolic function is mildly reduced. The right ventricular size is normal. There is mildly elevated pulmonary artery systolic pressure. The estimated right ventricular systolic pressure is 09.3 mmHg.  3. A small pericardial effusion is present.  4. The mitral valve is normal in structure. Mild mitral valve regurgitation.  5. The aortic valve is tricuspid. Aortic valve regurgitation is not visualized. No aortic stenosis is present.  6. The inferior vena cava is normal in size with greater than 50% respiratory variability, suggesting right atrial pressure of 3 mmHg. FINDINGS  Left Ventricle: Left ventricular ejection fraction, by estimation, is 40 to  45%. The left ventricle has mildly decreased function. The left ventricle demonstrates global hypokinesis. The left ventricular internal cavity size was normal in size. There is  mild left ventricular hypertrophy. Left ventricular diastolic parameters are consistent with Grade II diastolic dysfunction (pseudonormalization). Elevated left atrial pressure. Right Ventricle: The right ventricular size is normal. No increase in right  ventricular wall thickness. Right ventricular systolic function is mildly reduced. There is mildly elevated pulmonary artery systolic pressure. The tricuspid regurgitant velocity  is 2.85 m/s, and with an assumed right atrial pressure of 3 mmHg, the estimated right ventricular systolic pressure is 21.1 mmHg. Left Atrium: Left atrial size was normal in size. Right Atrium: Right atrial size was normal in size. Pericardium: A small pericardial effusion is present. Mitral Valve: The mitral valve is normal in structure. Mild mitral valve regurgitation. Tricuspid Valve: The tricuspid valve is normal in structure. Tricuspid valve regurgitation is trivial. Aortic Valve: The aortic valve is tricuspid. Aortic valve regurgitation is not visualized. No aortic stenosis is present. Aortic valve mean gradient measures 4.0 mmHg. Aortic valve peak gradient measures 8.3 mmHg. Aortic valve area, by VTI measures 2.89 cm. Pulmonic Valve: The pulmonic valve was not well visualized. Pulmonic valve regurgitation is trivial. Aorta: The aortic root is normal in size and structure. Venous: The inferior vena cava is normal in size with greater than 50% respiratory variability, suggesting right atrial pressure of 3 mmHg. IAS/Shunts: The interatrial septum was not well visualized.  LEFT VENTRICLE PLAX 2D LVIDd:         4.50 cm     Diastology LVIDs:         4.00 cm     LV e' medial:    7.07 cm/s LV PW:         1.10 cm     LV E/e' medial:  11.4 LV IVS:        1.00 cm     LV e' lateral:   4.24 cm/s LVOT diam:     2.00 cm      LV E/e' lateral: 19.1 LV SV:         61 LV SV Index:   37 LVOT Area:     3.14 cm  LV Volumes (MOD) LV vol d, MOD A2C: 60.3 ml LV vol d, MOD A4C: 55.3 ml LV vol s, MOD A2C: 50.0 ml LV vol s, MOD A4C: 36.8 ml LV SV MOD A2C:     10.3 ml LV SV MOD A4C:     55.3 ml LV SV MOD BP:      16.1 ml RIGHT VENTRICLE RV Basal diam:  2.30 cm RV Mid diam:    1.40 cm RV S prime:     8.59 cm/s TAPSE (M-mode): 1.2 cm LEFT ATRIUM             Index        RIGHT ATRIUM           Index LA diam:        2.30 cm 1.41 cm/m   RA Area:     13.70 cm LA Vol (A2C):   47.2 ml 28.90 ml/m  RA Volume:   37.70 ml  23.08 ml/m LA Vol (A4C):   31.8 ml 19.47 ml/m LA Biplane Vol: 40.3 ml 24.67 ml/m  AORTIC VALVE                    PULMONIC VALVE AV Area (Vmax):    2.75 cm     PV Vmax:          1.06 m/s AV Area (Vmean):   2.67 cm     PV Vmean:         72.100 cm/s AV Area (VTI):     2.89 cm     PV VTI:  0.175 m AV Vmax:           144.00 cm/s  PV Peak grad:     4.5 mmHg AV Vmean:          96.500 cm/s  PV Mean grad:     2.5 mmHg AV VTI:            0.211 m      PR End Diast Vel: 10.24 msec AV Peak Grad:      8.3 mmHg AV Mean Grad:      4.0 mmHg LVOT Vmax:         126.00 cm/s LVOT Vmean:        82.000 cm/s LVOT VTI:          0.194 m LVOT/AV VTI ratio: 0.92  AORTA Ao Root diam: 2.00 cm Ao Asc diam:  3.20 cm MITRAL VALVE                TRICUSPID VALVE MV Area (PHT): 9.85 cm     TR Peak grad:   32.5 mmHg MV Decel Time: 77 msec      TR Vmax:        285.00 cm/s MR Peak grad: 89.5 mmHg MR Mean grad: 55.0 mmHg     SHUNTS MR Vmax:      473.00 cm/s   Systemic VTI:  0.19 m MR Vmean:     345.0 cm/s    Systemic Diam: 2.00 cm MV E velocity: 80.80 cm/s MV A velocity: 104.00 cm/s MV E/A ratio:  0.78 Oswaldo Milian MD Electronically signed by Oswaldo Milian MD Signature Date/Time: 06/06/2021/1:13:58 PM    Final     Microbiology: Results for orders placed or performed during the hospital encounter of 06/03/21  Resp Panel by RT-PCR (Flu A&B,  Covid) Nasopharyngeal Swab     Status: None   Collection Time: 06/03/21  7:52 PM   Specimen: Nasopharyngeal Swab; Nasopharyngeal(NP) swabs in vial transport medium  Result Value Ref Range Status   SARS Coronavirus 2 by RT PCR NEGATIVE NEGATIVE Final    Comment: (NOTE) SARS-CoV-2 target nucleic acids are NOT DETECTED.  The SARS-CoV-2 RNA is generally detectable in upper respiratory specimens during the acute phase of infection. The lowest concentration of SARS-CoV-2 viral copies this assay can detect is 138 copies/mL. A negative result does not preclude SARS-Cov-2 infection and should not be used as the sole basis for treatment or other patient management decisions. A negative result may occur with  improper specimen collection/handling, submission of specimen other than nasopharyngeal swab, presence of viral mutation(s) within the areas targeted by this assay, and inadequate number of viral copies(<138 copies/mL). A negative result must be combined with clinical observations, patient history, and epidemiological information. The expected result is Negative.  Fact Sheet for Patients:  EntrepreneurPulse.com.au  Fact Sheet for Healthcare Providers:  IncredibleEmployment.be  This test is no t yet approved or cleared by the [place] FDA and  has been authorized for detection and/or diagnosis of SARS-CoV-2 by FDA under an Emergency Use Authorization (EUA). This EUA will remain  in effect (meaning this test can be used) for the duration of the COVID-19 declaration under Section 564(b)(1) of the Act, 21 U.S.C.section 360bbb-3(b)(1), unless the authorization is terminated  or revoked sooner.       Influenza A by PCR NEGATIVE NEGATIVE Final   Influenza B by PCR NEGATIVE NEGATIVE Final    Comment: (NOTE) The Xpert Xpress SARS-CoV-2/FLU/RSV plus assay is intended as an aid in the  diagnosis of influenza from Nasopharyngeal swab specimens and should  not be used as a sole basis for treatment. Nasal washings and aspirates are unacceptable for Xpert Xpress SARS-CoV-2/FLU/RSV testing.  Fact Sheet for Patients: EntrepreneurPulse.com.au  Fact Sheet for Healthcare Providers: IncredibleEmployment.be  This test is not yet approved or cleared by the [place] FDA and has been authorized for detection and/or diagnosis of SARS-CoV-2 by FDA under an Emergency Use Authorization (EUA). This EUA will remain in effect (meaning this test can be used) for the duration of the COVID-19 declaration under Section 564(b)(1) of the Act, 21 U.S.C. section 360bbb-3(b)(1), unless the authorization is terminated or revoked.  Performed at [facility], [place] [address], [place], [place] [address]   CSF culture w Gram Stain     Status: None   Collection Time: 06/05/21  1:50 PM   Specimen: CSF; Cerebrospinal Fluid  Result Value Ref Range Status   Specimen Description CSF  Final   Special Requests LP  Final   Gram Stain   Final    WBC PRESENT, PREDOMINANTLY PMN NO ORGANISMS SEEN CYTOSPIN SMEAR    Culture   Final    NO GROWTH 3 DAYS Performed at [facility], [place] [address] [place], [place] [address]    Report Status 06/08/2021 FINAL  Final    Labs: CBC: Recent Labs  Lab 06/09/21 0247  WBC 4.0  NEUTROABS 2.3  HGB 8.5*  HCT 28.7*  MCV 102.1*  PLT 725   Basic Metabolic Panel: Recent Labs  Lab 06/08/21 1645 06/09/21 0247 06/10/21 0908 06/11/21 0450 06/12/21 0316 06/13/21 0230 06/14/21 0151 06/15/21 0100  NA  --  150*   < > 140 135 136 137 135  K  --  4.2   < > 4.7 5.2* 5.9* 5.8* 4.8  CL  --  111   < > 101 99 102 106 104  CO2  --  28   < > _0 20* 20*  GLUCOSE  --  105*   < > 95 90 83 75 88  BUN  --  20   < > 23 24* 33* 43* 46*  CREATININE  --  2.26*   < > 1.91* 2.01* 2.49* 2.99* 3.14*  CALCIUM  --  7.8*   < > 8.1* 8.0* 8.3* 8.0* 7.9*  MG 2.3 2.0  --  1.5* 2.5* 2.1  --  2.1   PHOS 3.0  --   --   --   --   --   --   --    < > = values in this interval not displayed.   Liver Function Tests: Recent Labs  Lab 06/10/21 0908 06/13/21 0855  ALBUMIN 2.1* 2.8*   CBG: Recent Labs  Lab 06/12/21 2021 06/13/21 0851 06/13/21 2015 06/14/21 2016 06/15/21 0821  GLUCAP 102* 136* 117* 120* 77    Discharge time spent: greater than 30 minutes.  Signed: Hosie Poisson, MD [facility] 06/15/2021

## 2021-06-15 NOTE — Progress Notes (Signed)
Occupational Therapy Treatment Patient Details Name: [name] MRN: [id number] DOB: 1961-01-06 Today's Date: 06/15/2021   History of present illness Pt is a 61 y/o male who presents with recurrent seizures. PMH significant for CAD, CHF, HTN, CKD IV, Raynard's, GIB, recent history of seizures, hypothyroidism.   OT comments  Patient seated on EOB with family assisting patient with dressing stating he was preparing for discharge home today.  Family asked to address stairs before leaving. Patient ambulated to rehab gym and family was instructed on safe use of ascending and descending stairs without rail use per family description of stairs at home.  Patient was able to demonstrate good understanding. Gait belt provided to family to increase safety with mobility and transfer at home.    Recommendations for follow up therapy are one component of a multi-disciplinary discharge planning process, led by the attending physician.  Recommendations may be updated based on patient status, additional functional criteria and insurance authorization.    Follow Up Recommendations  Home health OT    Assistance Recommended at Discharge Frequent or constant Supervision/Assistance  Patient can return home with the following  A lot of help with walking and/or transfers;A lot of help with bathing/dressing/bathroom;Assistance with feeding;Direct supervision/assist for medications management;Assist for transportation;Help with stairs or ramp for entrance   Equipment Recommendations  BSC/3in1;Wheelchair (measurements OT);Wheelchair cushion (measurements OT);Hospital bed    Recommendations for Other Services      Precautions / Restrictions Precautions Precautions: Fall Precaution Comments: has had history of agitation Restrictions Weight Bearing Restrictions: No       Mobility Bed Mobility Overal bed mobility: Needs Assistance             General bed mobility comments: seated on EOB upon  arrival and at end of session    Transfers Overall transfer level: Needs assistance Equipment used: Rolling walker (2 wheels) Transfers: Sit to/from Stand Sit to Stand: Min assist     Step pivot transfers: Min assist     General transfer comment: min assist for balance with walker use     Balance Overall balance assessment: Needs assistance Sitting-balance support: Feet supported Sitting balance-Leahy Scale: Fair Sitting balance - Comments: sitting unassisted on EOB   Standing balance support: Single extremity supported, Bilateral upper extremity supported Standing balance-Leahy Scale: Poor Standing balance comment: requires bilat UE support                           ADL either performed or assessed with clinical judgement   ADL                   Upper Body Dressing : Minimal assistance;Sitting Upper Body Dressing Details (indicate cue type and reason): donned T-shirt Lower Body Dressing: Maximal assistance;Sit to/from stand                 General ADL Comments: family assisting patient with dressing when OTA arrived    Extremity/Trunk Assessment              Vision       Perception     Praxis      Cognition Arousal/Alertness: Awake/alert Behavior During Therapy: Flat affect Overall Cognitive Status: Impaired/Different from baseline Area of Impairment: Orientation, Attention, Memory, Following commands, Safety/judgement, Problem solving, Awareness                 Orientation Level: Disoriented to, Situation, Time Current Attention Level: Focused Memory: Decreased short-term memory Following  Commands: Follows one step commands inconsistently, Follows one step commands with increased time Safety/Judgement: Decreased awareness of safety, Decreased awareness of deficits Awareness: Intellectual Problem Solving: Slow processing, Requires verbal cues, Requires tactile cues General Comments: patient pleasant due to aware of  discharge plans for home today        Exercises      Shoulder Instructions       General Comments      Pertinent Vitals/ Pain       Pain Assessment Pain Assessment: No/denies pain Pain Intervention(s): Monitored during session  Home Living                                          Prior Functioning/Environment              Frequency  Min 2X/week        Progress Toward Goals  OT Goals(current goals can now be found in the care plan section)  Progress towards OT goals: Progressing toward goals  Acute Rehab OT Goals Patient Stated Goal: go home OT Goal Formulation: With patient/family Time For Goal Achievement: 06/19/21 Potential to Achieve Goals: Good ADL Goals Pt Will Perform Grooming: with modified independence;standing Pt Will Perform Upper Body Dressing: with modified independence;sitting Pt Will Perform Lower Body Dressing: with modified independence;sit to/from stand Pt Will Transfer to Toilet: with supervision;ambulating Pt/caregiver will Perform Home Exercise Program: Increased strength;Both right and left upper extremity;With written HEP provided Additional ADL Goal #1: Pt will indep follow multi-step ADL task  Plan Discharge plan remains appropriate    Co-evaluation                 AM-PAC OT "6 Clicks" Daily Activity     Outcome Measure   Help from another person eating meals?: Total Help from another person taking care of personal grooming?: A Little Help from another person toileting, which includes using toliet, bedpan, or urinal?: A Lot Help from another person bathing (including washing, rinsing, drying)?: A Lot Help from another person to put on and taking off regular upper body clothing?: A Lot Help from another person to put on and taking off regular lower body clothing?: A Lot 6 Click Score: 12    End of Session Equipment Utilized During Treatment: Rolling walker (2 wheels);Gait belt  OT Visit Diagnosis:  Other abnormalities of gait and mobility (R26.89);Muscle weakness (generalized) (M62.81);Unsteadiness on feet (R26.81)   Activity Tolerance Patient tolerated treatment well   Patient Left in bed;with call bell/phone within reach;with family/visitor present (seated on EOB)   Nurse Communication Mobility status        Time: 6770-3403 OT Time Calculation (min): 16 min  Charges: OT General Charges $OT Visit: 1 Visit OT Treatments $Therapeutic Activity: 8-22 mins  Lodema Hong, [place]  Pager [phone] Office [place] 06/15/2021, 12:30 PM

## 2021-06-15 NOTE — TOC Transition Note (Signed)
Transition of Care [facility]) - CM/SW Discharge Note   Patient Details  Name: [name] MRN: [id number] Date of Birth: [birth year]  Transition of Care [facility]) CM/SW Contact:  Cody Friar, RN Phone Number: 06/15/2021, 10:42 AM   Clinical Narrative:    Patient is discharging home with home health services through [facility]. [name] with [facility] aware of d/c home.  DME to be delivered to the home this afternoon. Wife has spoken to [facility] and doesn't want to wait on DME to be delivered before discharging. MD updated.  Medications for home to be delivered to the room per [place].  Family providing transport home.   Final next level of care: Home w Home Health Services Barriers to Discharge: No Barriers Identified   Patient Goals and CMS Choice   CMS Medicare.gov Compare Post Acute Care list provided to:: Patient Represenative (must comment) Choice offered to / list presented to : Spouse  Discharge Placement                       Discharge Plan and Services   Discharge Planning Services: CM Consult Post Acute Care Choice: Home Health, Durable Medical Equipment          DME Arranged: Walker rolling, Hospital bed DME Agency: [facility] Date DME Agency Contacted: 06/14/21   Representative spoke with at DME Agency: [name] HH Arranged: RN, PT, OT, Social Work [facility] Agency: [place] Date [place]: 06/14/21   Representative spoke with at [place]: [place]) Interventions     Readmission Risk Interventions Readmission Risk Prevention Plan 05/23/2021 11/10/2019 02/11/2019  Transportation Screening Complete Complete Complete  PCP or Specialist Appt within 3-5 Days Complete Complete Complete  HRI or Home Care Consult Complete Complete Complete  Social Work Consult for [place] Planning/Counseling Complete Complete Complete  Palliative Care Screening Not Applicable Not Applicable Not Applicable  Medication  Review Press photographer) Complete Complete Complete  Some recent data might be hidden

## 2021-06-15 NOTE — TOC Benefit Eligibility Note (Signed)
Patient Advocate Encounter  Prior Authorization for Lacosamide 50 mg has been approved.    PA# [id number] Effective dates: 06/15/2021 through 06/15/2022      Lyndel Safe, [place] Patient Advocate Specialist [place] Patient Advocate Team Direct Number: [phone]  Fax: [phone]

## 2021-06-16 ENCOUNTER — Other Ambulatory Visit (HOSPITAL_COMMUNITY): Payer: Self-pay

## 2021-06-17 ENCOUNTER — Telehealth: Payer: Self-pay

## 2021-06-17 NOTE — Telephone Encounter (Signed)
[name] called stating the doctor's took [name] off his Allopurinol while he was in the hospital.  He was discharge 1 1/2 days ago and is beginning to have pain in his toe.  [name] requested a return call to let her know if he can restart the medication.

## 2021-06-19 NOTE — Telephone Encounter (Signed)
Devki, please review the  medication list for drug interactions.  I am uncertain why allopurinol was stopped.

## 2021-06-20 ENCOUNTER — Telehealth: Payer: Self-pay | Admitting: Neurology

## 2021-06-20 NOTE — Telephone Encounter (Signed)
Per review of patient's medications, allopurinol has Grade C interaction with both torsemide and phenytoin.    Allopurinol can increase serum concentration of phenytoin - may require phenytoin level monitoring if allopurinol is discontinued  Torsemide can increase serum concentration of allopurinol and increase risk of allopurinol-related side effects/toxicity (rash or hypersensitivity reaction).  Per discharge summary, plan is to continue allopurinol 170m daily  DKnox Saliva PharmD, MPH, BCPS Clinical Pharmacist (Rheumatology and Pulmonology)

## 2021-06-21 ENCOUNTER — Other Ambulatory Visit: Payer: Self-pay

## 2021-06-21 ENCOUNTER — Ambulatory Visit (INDEPENDENT_AMBULATORY_CARE_PROVIDER_SITE_OTHER): Payer: Medicare Other | Admitting: Neurology

## 2021-06-21 ENCOUNTER — Encounter: Payer: Self-pay | Admitting: Neurology

## 2021-06-21 ENCOUNTER — Telehealth: Payer: Self-pay

## 2021-06-21 VITALS — BP 113/72 | HR 92 | Ht 64.0 in | Wt 103.5 lb

## 2021-06-21 DIAGNOSIS — G40001 Localization-related (focal) (partial) idiopathic epilepsy and epileptic syndromes with seizures of localized onset, not intractable, with status epilepticus: Secondary | ICD-10-CM

## 2021-06-21 MED ORDER — LACOSAMIDE 50 MG PO TABS: 50.0000 mg | ORAL_TABLET | Freq: Two times a day (BID) | ORAL | 5 refills | Status: DC

## 2021-06-21 MED ORDER — PHENYTOIN 125 MG/5ML PO SUSP: 25.0000 mg | Freq: Three times a day (TID) | ORAL | 12 refills | Status: DC

## 2021-06-21 NOTE — Telephone Encounter (Signed)
Spoke with [name] and advised patient can restart allopurinol. It looks like it was held while inpatient but plan was to resume at discharge based on discharge summary. She expressed understanding.

## 2021-06-21 NOTE — Progress Notes (Signed)
NEUROLOGY FOLLOW UP OFFICE NOTE  [name] [id number] Oct 19, 1960  HISTORY OF PRESENT ILLNESS: I had the pleasure of seeing [name] in follow-up in the neurology clinic on 06/21/2021.  The patient was last seen 3 weeks ago for new onset seizure that occurred on 05/28/2021. He is again accompanied by his wife who helps supplement the history today. Records and images were personally reviewed where available.  After his initial visit, he was prescribed Briviact but was unable to obtain from pharmacy due to not having it in stock. He had his routine EEG on 06/03/21, then later that day his wife called to report that his hand was numb, he had a seizure with urinary incontinence and was not himself. His routine EEG showed right temporal slowing and frequent epileptiform discharges, at times in a periodic pattern with no evolution. They were instructed to call EMS, and when EMS arrived, he had 2 witnessed GTCs. He underwent LTM which initially showed right frontal LPDs and some electrographic seizures arising from the right hemisphere. MRI brain without contrast was limited with only DWI imaging performed, showing increased diffusion signal in the mesial right temporal lobe/right hippocampal formation, possible patchy involvement of the right thalamus. He had low magnesium and calcium on admission. He was treated with Briviact and Dilantin, Vimpat was added due to continued LPDs with note of improvement in morphology with increased medication. During his stay, he remained confused and had a lumbar puncture which was normal. He was discharged home on 06/15/2021 on Briviact 133m BID, Phenytoin 732mBID, and Vimpat 5046mID, however his wife brings his medications and he has not been taking Briviact since home for 5 days.   His wife denies any episodes of staring/unresponsiveness, no convulsions since 06/03/21. The night before yesterday, he broke out in a sweat in his sleep, she woke him up and he said  he was fine. He slept all day yesterday. He is still confused, he reports "I'm confused, too much going on, I don't understand what is going on." His wife reports he is able to recognize family members. She manages his medications, he denies any side effects. He denies any olfactory/gustatory hallucinations, focal numbness/tingling/weakness, myoclonic jerks. He has chronic numbness in both hands. He denies any headaches, dizziness, no falls. He is ambulating with a walker and will be doing physical therapy. Sleep and appetite are okay.   Laboratory Data: Lab Results  Component Value Date   WBC 4.0 06/09/2021   HGB 8.5 (L) 06/09/2021   HCT 28.7 (L) 06/09/2021   MCV 102.1 (H) 06/09/2021   PLT 179 06/09/2021     Chemistry      Component Value Date/Time   NA 135 06/15/2021 0100   NA 145 (H) 06/11/2018 0852   K 4.8 06/15/2021 0100   CL 104 06/15/2021 0100   CO2 20 (L) 06/15/2021 0100   BUN 46 (H) 06/15/2021 0100   BUN 34 (H) 06/11/2018 0852   CREATININE 3.14 (H) 06/15/2021 0100   CREATININE 3.21 (H) 02/02/2021 1109      Component Value Date/Time   CALCIUM 7.9 (L) 06/15/2021 0100   CALCIUM 7.7 (L) 05/10/2021 1022   ALKPHOS 66 06/08/2021 0435   AST 20 06/08/2021 0435   ALT 15 06/08/2021 0435   BILITOT 0.2 (L) 06/08/2021 0435       History on Initial Assessment 05/31/2021: This is a 60 46ar old right-handed man with a history of hypertension, hypothyroidism, CKD, scleroderma, gout, presenting for evaluation of new  onset seizure. He had diarrhea 2 weeks prior and was treated for C. diff, bowel incontinence had resolved when he got home a week prior to the seizure on 05/28/2021. He has no recollection of events, no prior warning, watching TV then waking up to EMS around him with bowel incontinence. No focal weakness. His wife reports he was watching TV then said something weird about a coat, she came to the room to find him with clonic activity of the right arm as it hitting, followed by  generalized convulsion with head turn to the left with left arm flexed, he vocalized and was foaming at the mouth. After the seizure, he was staring and did not know what was going on when EMS arrived. He was brought to the ER where bloodwork showed a creatnine of 3.08, calcium 5.9, UDS negative, EtOH negative. I personally reviewed head CT without contrast, no acute changes seen. The next night, he had an episode in his sleep with right arm twitching, moaning, mouth automatisms that lasted a few minutes. Since then, he has had daily episodes where he gets flustered and his mouth gets watery, he would be making chewing movements and swallowing, unable to get his words out. The other day he called to her about something black on the floor. He had a small episode coming up the elevator today where he had to sit and catch his breath, then asked her where they were going. She recalls a confusional episode in June/July 2022 as they were coming home, he asked who was at their house, not recognizing their own cars. She has noticed short-term memory loss since the seizure, not recognizing where his doctor's office is, repeating himself a lot. He gets upset as his wife reports these symptoms. He denies any olfactory/gustatory hallucinations, deja vu, rising epigastric sensation, focal numbness/tingling/weakness, myoclonic jerks. He has occasional numbness and tingling in both hands and feet from the scleroderma, getting cold easily. He denies any headaches, dizziness, diplopia, dysarthria/dysphagia, neck/back pain. Sleep is okay until he took the antibiotics for C. diff, causing nightmares. He drinks socially, no alcohol prior the seizure. He has been taking calcium twice a day since the seizure, he saw his nephrologist this morning and had repeat bloodwork done. He had a normal birth and early development.  There is no history of febrile convulsions, CNS infections such as meningitis/encephalitis, significant traumatic  brain injury, neurosurgical procedures, or family history of seizures.   PAST MEDICAL HISTORY: Past Medical History:  Diagnosis Date   (HFpEF) heart failure with preserved ejection fraction (HCC)    Anxiety    CAD (coronary artery disease)    CKD (chronic kidney disease), stage IV (HCC)    Gout    Hyperlipidemia    Hypertension 05/14/2012    Lexiscan-- EF 51% ,LV normal   Hypothyroid    MI (myocardial infarction) ([place]) 2010   Pericardial effusion 12/2008   Dr [name] performed a subxiphoid window removing 21m of fluid   Pericardial effusion 03/09/2010   Echo-LVEF >55%, very small pericardial effusion ,,Stage 1 (impaired ) diastolic fxn, elevated LV filling   Pericarditis    Raynaud's phenomenon    Scleroderma ([place]    Seizures ([facility]    Smoker 09/16/2018   1 ppd   Vitiligo     MEDICATIONS: Current Outpatient Medications on File Prior to Visit  Medication Sig Dispense Refill   allopurinol (ZYLOPRIM) 100 MG tablet TAKE 1 TABLET BY MOUTH EVERY DAY (Patient taking differently: Take 100 mg by mouth  daily.) 30 tablet 2   brivaracetam (BRIVIACT) 100 MG TABS tablet Take 1 tablet (100 mg total) by mouth 2 (two) times daily. 60 tablet    calcium carbonate (TUMS - DOSED IN MG ELEMENTAL CALCIUM) 500 MG chewable tablet Chew 1 tablet by mouth 2 (two) times daily with a meal. 20 tablet 0   colchicine 0.6 MG tablet Take 0.6 mg by mouth daily as needed (gout attacks).     Epoetin Alfa-epbx (RETACRIT IJ) Inject as directed every 30 (thirty) days.     feeding supplement (ENSURE ENLIVE / ENSURE PLUS) LIQD Take 237 mLs by mouth 3 (three) times daily between meals. 237 mL 12   lacosamide (VIMPAT) 50 MG TABS tablet Take 1 tablet (50 mg total) by mouth 2 (two) times daily. 60 tablet 0   macitentan (OPSUMIT) 10 MG tablet Take 1 tablet (10 mg total) by mouth daily. 90 tablet 3   multivitamin-iron-minerals-folic acid (CENTRUM) chewable tablet Chew 1 tablet by mouth daily.     pantoprazole  (PROTONIX) 40 MG tablet Take 1 tablet (40 mg total) by mouth daily for 90 doses. 90 tablet 3   phenytoin (DILANTIN) 125 MG/5ML suspension Take 1 mL (25 mg total) by mouth every 8 (eight) hours. 237 mL 12   QUEtiapine (SEROQUEL) 25 MG tablet Take 1 tablet (25 mg total) by mouth at bedtime. 30 tablet 1   rosuvastatin (CRESTOR) 20 MG tablet Take 20 mg by mouth daily.     sildenafil (REVATIO) 20 MG tablet Take 2 tablets (40 mg total) by mouth 3 (three) times daily. (Patient taking differently: Take 40 mg by mouth in the morning and at bedtime.) 180 tablet 11   sodium bicarbonate 650 MG tablet Take 2 tablets (1,300 mg total) by mouth 3 (three) times daily. 30 tablet 0   torsemide (DEMADEX) 20 MG tablet Take 20 mg by mouth daily.     No current facility-administered medications on file prior to visit.    ALLERGIES: Allergies  Allergen Reactions   Oxycodone Other (See Comments)    Hallucinations    FAMILY HISTORY: Family History  Problem Relation Age of Onset   Lupus Mother    Cancer Mother        unknown per wife   Kidney failure Father    Autoimmune disease Sister    Lung disease Daughter    Colon cancer Neg Hx     SOCIAL HISTORY: Social History   Socioeconomic History   Marital status: Married    Spouse name: [name]   Number of children: 6   Years of education: 9   Highest education level: Not on file  Occupational History   Occupation: Development worker, community   Occupation: Disable, retired  Tobacco Use   Smoking status: Every Day    Packs/day: 1.00    Years: 38.00    Pack years: 38.00    Types: Cigarettes    Start date: 10/21/1971   Smokeless tobacco: Never  Vaping Use   Vaping Use: Never used  Substance and Sexual Activity   Alcohol use: Yes    Comment: h/o heavy use, has cut way back   Drug use: No   Sexual activity: Yes    Partners: Female  Other Topics Concern   Not on file  Social History Narrative   Lives with his wife and one daughter.   Social  Determinants of Health   Financial Resource Strain: Not on file  Food Insecurity: Not on file  Transportation Needs: Not on  file  Physical Activity: Not on file  Stress: Not on file  Social Connections: Not on file  Intimate Partner Violence: Not on file     PHYSICAL EXAM: Vitals:   06/21/21 1327  BP: 113/72  Pulse: 92   General: No acute distress Head:  Normocephalic/atraumatic Skin/Extremities: No rash, no edema Neurological Exam: alert and oriented to person, place, and time. No aphasia or dysarthria. Fund of knowledge is reduced.  Recent and remote memory are impaired, 1/3 delayed recall. Attention and concentration are reduced, refused to do months backwards, he agreed to say months of the year in order, but as he got to September, he then went back to March and restarted until July and stopped. Cranial nerves: Pupils equal, round. Extraocular movements intact with no nystagmus. Visual fields full.  No facial asymmetry.  Motor: Bulk and tone normal, muscle strength 5/5 throughout with no pronator drift.   Finger to nose testing intact.  Gait slow and cautious with walker, no ataxia.   IMPRESSION: This is a 61 yo RH man with a history of hypertension, hypothyroidism, CKD, scleroderma, gout, with new onset seizures arising from the right temporal region. He had recurrent seizures on 06/03/21 with periodic discharges over the right frontal region. There was improvement in EEG during his hospital admission, he is still feeling confused. No further seizures since 06/04/2021. Brain MRI was limited in the hospital with only DWI sequences obtained, proceed with outpatient brain MRI without contrast previously discussed. We will do an EEG today and depending on results, may adjust current doses of Lacosamide 78m BID and Phenytoin 258mTID. He is not driving. Proceed with PT. Follow-up in 1 month, call for any changes.    Thank you for allowing me to participate in his care.  Please do not  hesitate to call for any questions or concerns.    KaEllouise NewerM.D.   CC: Dr. AvDagmar Hait

## 2021-06-21 NOTE — Patient Instructions (Addendum)
Good to see you. Continue all your medications.  Call [facility] to schedule the brain MRI.We have sent a referral to [facility] for your MRI and they will call you directly to schedule your appointment. They are located at [place]. If you need to contact them directly please call [phone].   2. Schedule routine EEG  3. Follow-up with Dr. Joelyn Oms, discuss sodium bicarbonate with your primary care doctor  4. Follow-up with me in 1 month, call for any changes   Seizure Precautions: 1. If medication has been prescribed for you to prevent seizures, take it exactly as directed.  Do not stop taking the medicine without talking to your doctor first, even if you have not had a seizure in a long time.   2. Avoid activities in which a seizure would cause danger to yourself or to others.  Don't operate dangerous machinery, swim alone, or climb in high or dangerous places, such as on ladders, roofs, or girders.  Do not drive unless your doctor says you may.  3. If you have any warning that you may have a seizure, lay down in a safe place where you can't hurt yourself.    4.  No driving for 6 months from last seizure, as per [facility].   Please refer to the following link on the [place] website for more information: http://[email or web address].org/[email or web address]/driving/drivingu.cfm   5.  Maintain good sleep hygiene. Avoid alcohol.  6.  Contact your doctor if you have any problems that may be related to the medicine you are taking.  7.  Call 911 and bring the patient back to the ED if:        A.  The seizure lasts longer than 5 minutes.       B.  The patient doesn't awaken shortly after the seizure  C.  The patient has new problems such as difficulty seeing, speaking or moving  D.  The patient was injured during the seizure  E.  The patient has a temperature over 102 F (39C)  F.  The patient vomited and now is having  trouble breathing

## 2021-06-21 NOTE — Telephone Encounter (Signed)
Pt wife called an informed that Mr Auer's EEG done earlier today was normal, no seizure activity seen. Continue on current medications. If any change in symptoms, call our office

## 2021-06-21 NOTE — Procedures (Signed)
ELECTROENCEPHALOGRAM REPORT  Date of Study: 06/21/2021  Patient's Name: [name] MRN: [id number] Date of Birth: Jan 06, 1961  Referring Provider: Dr. Ellouise Newer  Clinical History: This is a 61 year old man with recent hospitalization after recurrent seizures with continued abnormal EEG (LPDs) in the hospital. His wife reports continued confusion, waking up with sweat followed by fatigue yesterday. EEG to assess for subclinical seizures.   Medications: Lacosamide  Phenytoin ZYLOPRIM 100 MG tablet XANAX 0.5 MG tablet TUMS - DOSED IN MG ELEMENTAL CALCIUM 500 MG chewable tablet RETACRIT IJ NORCO 10-325 MG tablet OPSUMIT 10 MG tablet ROBAXIN 500 MG tablet CENTRUM chewable tablet PROTONIX 40 MG tablet KLOR-CON M20 20 MEQ tablet CRESTOR 20 MG tablet REVATIO 20 MG tabl0et sodium bicarbonate 650 MG tablet DEMADEX 20 MG tablet  Technical Summary: A multichannel digital EEG recording measured by the international 10-20 system with electrodes applied with paste and impedances below 5000 ohms performed in our laboratory with EKG monitoring in an awake and drowsy patient.  Hyperventilation was not performed. Photic stimulation was performed.  The digital EEG was referentially recorded, reformatted, and digitally filtered in a variety of bipolar and referential montages for optimal display.    Description: The patient is awake and drowsy during the recording.  During maximal wakefulness, there is a symmetric, medium voltage 8 Hz posterior dominant rhythm that attenuates with eye opening.  The record is symmetric.  During drowsiness, there is an increase in theta slowing of the background. Sleep was not captured. Photic stimulation did not elicit any abnormalities.  There were no epileptiform discharges or electrographic seizures seen.    EKG lead was unremarkable.  Impression: This awake and drowsy EEG is normal.    Clinical Correlation: A normal EEG does not exclude a clinical  diagnosis of epilepsy. No subclinical seizures seen.  If further clinical questions remain, prolonged EEG may be helpful.  Clinical correlation is advised.   Ellouise Newer, M.D.

## 2021-06-21 NOTE — Telephone Encounter (Signed)
-----  Message from Cameron Sprang, MD sent at 06/21/2021  3:22 PM EST ----- Pls let wife know the EEG done earlier today was normal, no seizure activity seen. Continue on current medications. If any change in symptoms, call our office, thanks

## 2021-06-25 ENCOUNTER — Encounter: Payer: Self-pay | Admitting: Nephrology

## 2021-06-28 ENCOUNTER — Encounter (HOSPITAL_COMMUNITY): Payer: Self-pay

## 2021-07-05 ENCOUNTER — Telehealth: Payer: Self-pay | Admitting: Neurology

## 2021-07-05 MED ORDER — LACOSAMIDE 100 MG PO TABS: ORAL_TABLET | ORAL | 5 refills | Status: DC

## 2021-07-05 NOTE — Telephone Encounter (Signed)
Patients wife left message with the access nurse.  Caller states her husband is refusing his medication because it makes him feel weird.  He has no neuro issues and she is unsure what to do. ? ?Access nurse message in box ?

## 2021-07-05 NOTE — Telephone Encounter (Signed)
Spoke to his wife [name], patient finally agreed to take his medication last night at midnight because he was starting to feel different. He has a hard time saying which is making his feel different. We discussed streamlining his medications, he is on a low dose of Phenytoin (70m (136m TID). Discussed increasing Lacosamide to 10067mID, then start weaning off Phenytoin to 1mL58mD x 3 days, then 1mL 42mly for 3 days, then stop. Call for any changes.  ?

## 2021-07-06 ENCOUNTER — Telehealth: Payer: Self-pay | Admitting: Neurology

## 2021-07-06 NOTE — Telephone Encounter (Signed)
Tried to speak with the patient Wife, Patient in the background screaming I want to got to the hospital. These medication are screwing me up. ?They are not working. Just take me to the hospital. ? ?Tried to ask the Patient he feels like he needs to go to the Hospital.  ?DR.Aqunio spoke to him 07/05/21 and she has started to Wean the patient off of Dilantin over the next 6 days. ? ?Per patient Just take me to the hospital.  This is not working. They screwed with my medication. ?The hospital will get it right. ? ?Per Wife the patient to the morning dose of Dilantin on 07/05/21 but the night medication he refused it. ?This morning he refused all of his medication. ? ?Per Dr.Aqunio advised wife to go ahead and take him to the hospital.  ? ? ?10:30 am: Spoke to the wife, patient has calmed down and is sleeping now. ? ?Advised Patient wife he wakes just see how he feeling. If he is feeling try again to give him his meds. If he is not calm and he keep screaming take him to the hospital.  ?

## 2021-07-06 NOTE — Telephone Encounter (Signed)
Patient's wife called and said the patient is needing to be hospitalized. ? ?He is refusing to take his medicine stating he wife is trying to poison him she said. ? ?He is in agreement.  ?

## 2021-07-07 ENCOUNTER — Telehealth: Payer: Self-pay | Admitting: Neurology

## 2021-07-07 NOTE — Telephone Encounter (Signed)
Caryl Pina from [facility] called about pt's medicine. I spoke with heather and forwarded the call to her. ?

## 2021-07-07 NOTE — Telephone Encounter (Signed)
Caryl Pina from [facility] called and wanted to let Dr Delice Lesch know that  pt Potassium was 3.0 they wanted to start him on potassium 20 MEq 1 tablet daily,  Pt magnesium 1.1 they want to start mag oxide 400 mg 1 tablet BID, Tums 2 tab BID. They are going to faxs his lab results over to [place] ?

## 2021-07-07 NOTE — Telephone Encounter (Signed)
Noted, no contraindication from neuro standpoint, thanks ?

## 2021-07-08 ENCOUNTER — Ambulatory Visit
Admission: RE | Admit: 2021-07-08 | Discharge: 2021-07-08 | Disposition: A | Discharge status: Home / Self Care | Payer: Medicare Other | Source: Ambulatory Visit | Attending: Neurology | Admitting: Neurology

## 2021-07-08 ENCOUNTER — Telehealth (HOSPITAL_COMMUNITY): Payer: Self-pay | Admitting: *Deleted

## 2021-07-08 ENCOUNTER — Other Ambulatory Visit: Payer: Self-pay

## 2021-07-08 IMAGING — MR MR HEAD W/O CM
11 of 12 series · 41 of 48 positions shown · non-contrast
Comparison: Brain MRI [DATE] and [DATE].

CLINICAL DATA: 60-year-old male with seizure. Confusion and
difficulty walking and talking.

EXAM:
MRI HEAD WITHOUT CONTRAST
TECHNIQUE: Multiplanar, multiecho pulse sequences of the brain and surrounding
structures were obtained without intravenous contrast.

[Series 5: T1 · sagittal · 4.0mm · 0.75mm/px · 1 of 31 slices shown (1 of 2)]
[im 1/31]
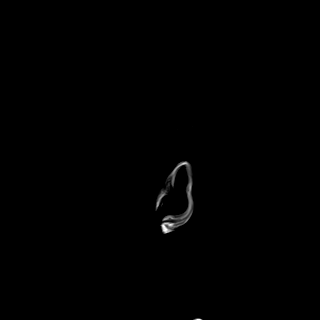

[Series 6: DWI · axial · 3.0mm · 0.94mm/px · z∈[-86,+60]mm · 8 of 167 slices shown (1 of 3)]
[im 1/167]
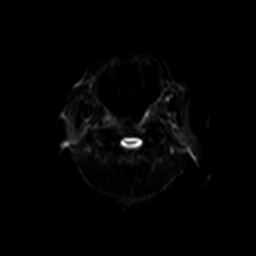
[im 19/167]
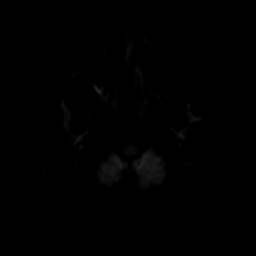
[im 56/167]
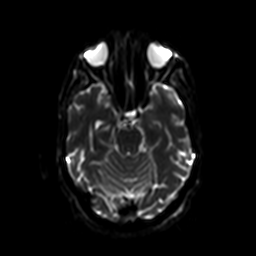
[im 74/167]
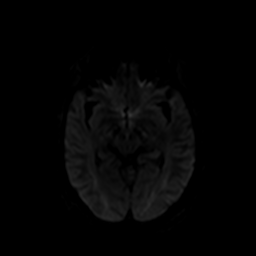
[im 93/167]
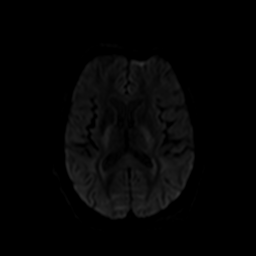
[im 111/167]
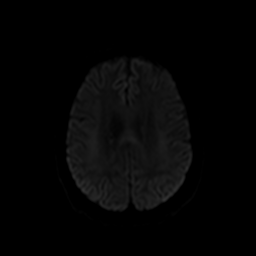
[im 148/167]
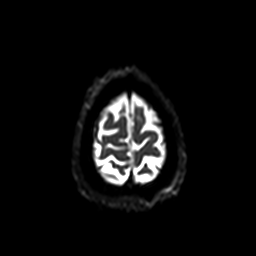
[im 167/167]
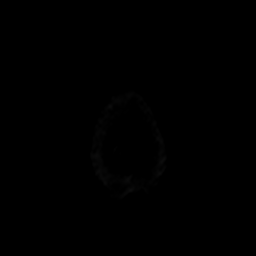

[Series 7: ax dwi_tracew · axial · 3.0mm · 0.94mm/px · z∈[-86,+60]mm · 5 of 83 slices shown]
[im 1/83]
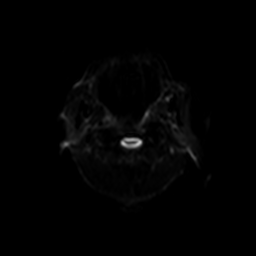
[im 21/83]
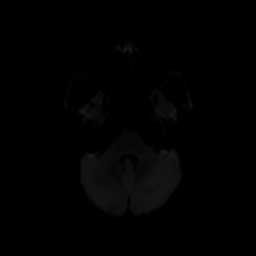
[im 42/83]
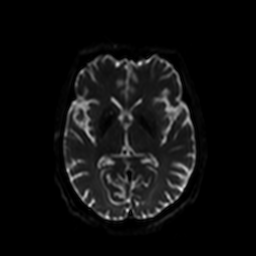
[im 62/83]
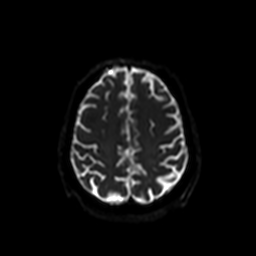
[im 83/83]
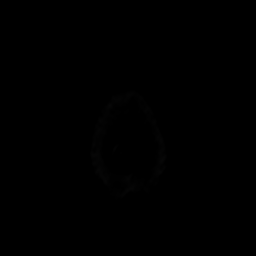

[Series 8: ax dwi_adc · axial · 3.0mm · 0.94mm/px · z∈[-86,+60]mm · 2 of 40 slices shown]
[im 1/40]
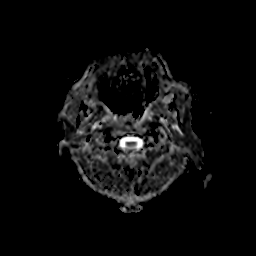
[im 40/40]
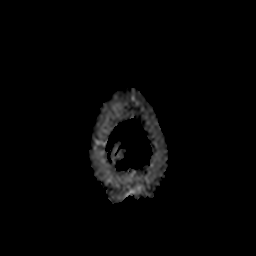

[Series 9: DWI · coronal · 5.0mm · 1.44mm/px · 3 of 60 slices shown (2 of 3)]
[im 1/60]
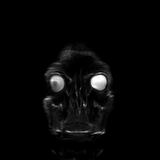
[im 30/60]
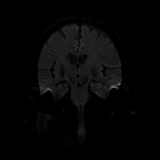
[im 60/60]
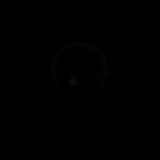

[Series 10: DWI · coronal · 5.0mm · 1.44mm/px · 2 of 30 slices shown (3 of 3)]
[im 1/30]
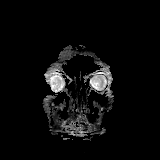
[im 30/30]
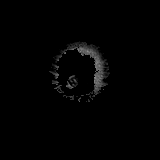

[Series 11: T2 · axial · 4.0mm · 0.36mm/px · z∈[-88,+61]mm · 2 of 30 slices shown (1 of 2)]
[im 1/30]
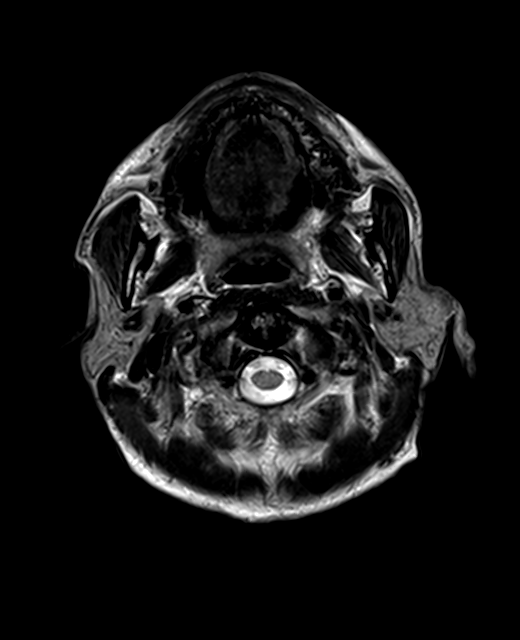
[im 30/30]
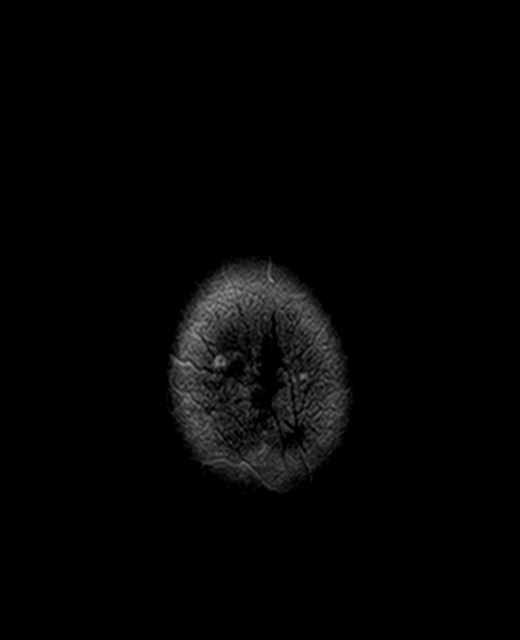

[Series 12: FLAIR · axial · 3.0mm · 0.72mm/px · z∈[-88,+60]mm · 2 of 26 slices shown]
[im 1/26]
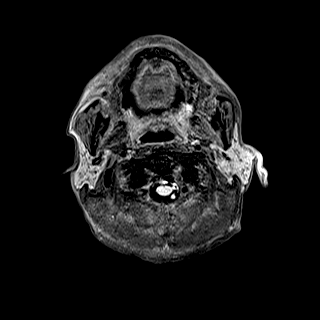
[im 26/26]
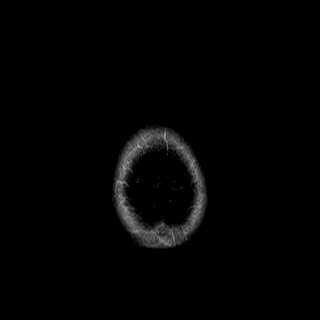

[Series 13: swi_images · axial · 1.5mm · 0.45mm/px · z∈[-84,+57]mm · 6 of 96 slices shown]
[im 1/96]
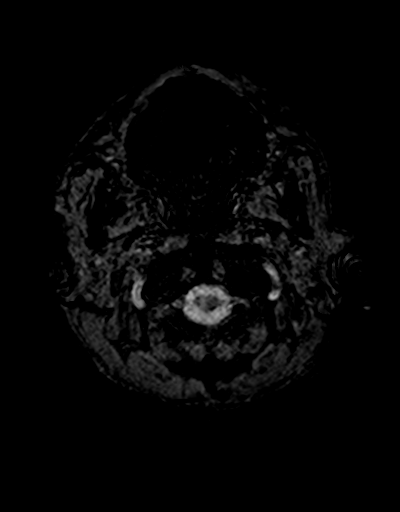
[im 20/96]
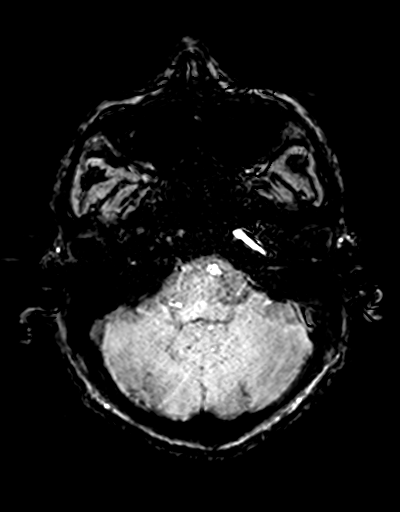
[im 39/96]
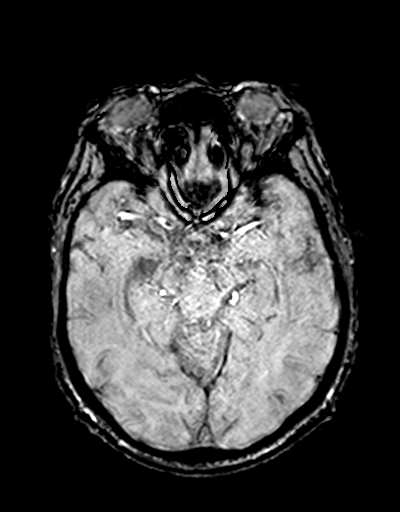
[im 58/96]
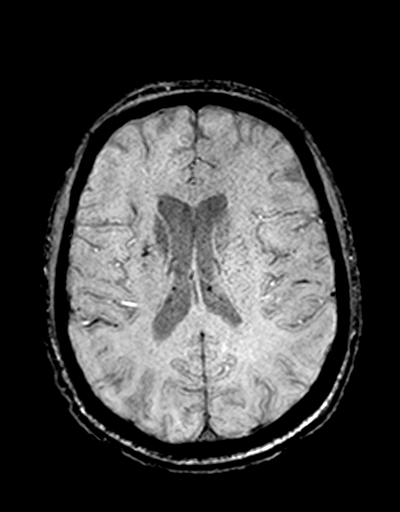
[im 77/96]
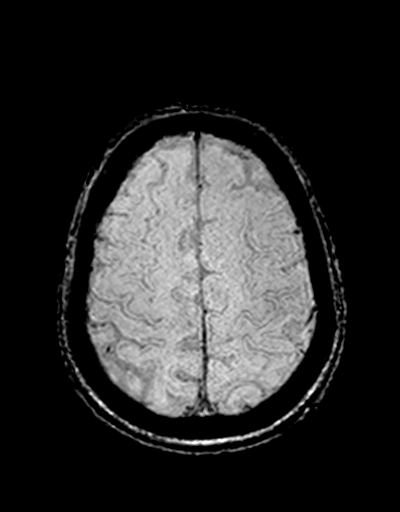
[im 96/96]
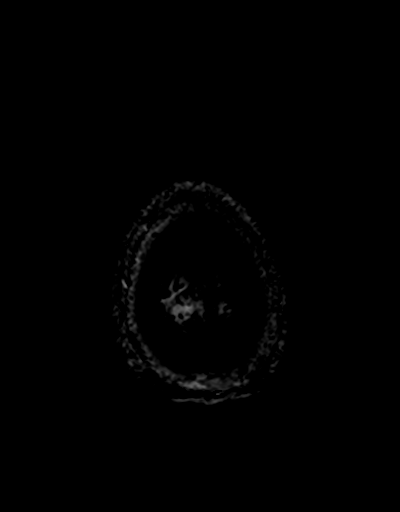

[Series 15: T1 · axial · 1.0mm · 0.94mm/px · z∈[-83,+58]mm · 8 of 144 slices shown (2 of 2)]
[im 1/144]
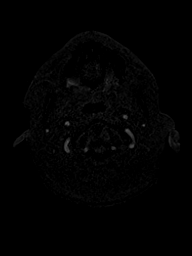
[im 21/144]
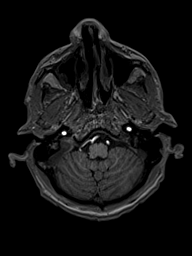
[im 41/144]
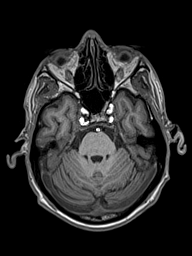
[im 62/144]
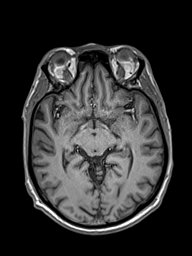
[im 82/144]
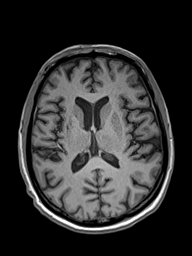
[im 103/144]
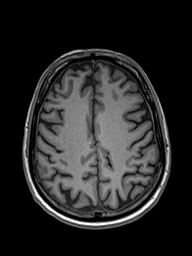
[im 123/144]
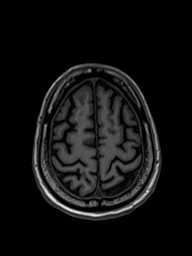
[im 144/144]
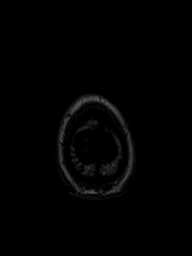

[Series 16: T2 · coronal · 4.5mm · 0.36mm/px · 2 of 30 slices shown (2 of 2)]
[im 1/30]
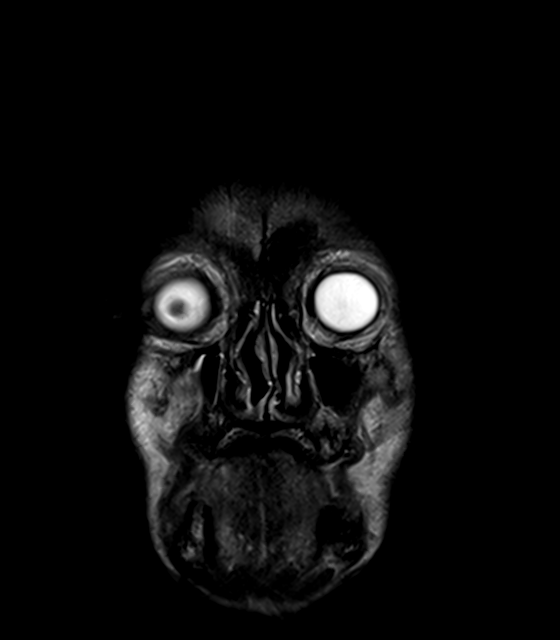
[im 30/30]
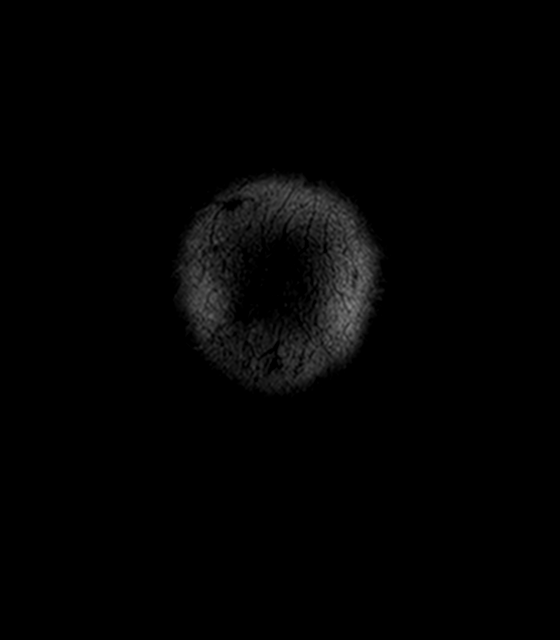

[41 of 48 positions shown; findings below may reference images not displayed]

FINDINGS: Superior image quality today compared to the 2 prior exams.

Brain: Overall cerebral volume seems within normal limits for age.

No restricted diffusion to suggest acute infarction. No midline
shift, mass effect, evidence of mass lesion, ventriculomegaly,
extra-axial collection or acute intracranial hemorrhage.
Cervicomedullary junction and pituitary are within normal limits.

Evidence of a small chronic lacunar infarct of the right corona
radiata. Mild associated heterogeneity in the right lentiform.
Minimal other bilateral cerebral white matter nonspecific T2 and
FLAIR hyperintensity, but possible small area of cortical
encephalomalacia in the right parietal lobe (series 12, image 20).
No chronic cerebral blood products identified. Other deep gray
matter nuclei, the brainstem, and cerebellum are within normal
limits. On coronal T2 weighted images today there is volume loss of
the right hippocampus (series 16, image 14). But no generalized
mesial temporal lobe edema or signal abnormality.

Vascular: Major intracranial vascular flow voids are preserved.

Skull and upper cervical spine: Negative for age visible cervical
spine. Visualized bone marrow signal is within normal limits.

Sinuses/Orbits: Mild paranasal sinus mucosal thickening, maximal in
the right maxillary sinus. Orbits appear negative.

Other: Small volume retained secretions in the posterior left nasal
cavity and pharynx. Grossly normal visible internal auditory
structures. Mastoids are clear. Negative visible scalp and face.
IMPRESSION: 1.  No acute intracranial abnormality.
2. Asymmetric volume loss of the right hippocampus raising the
possibility of mesial temporal sclerosis. No cerebral edema
identified.
3. Chronic appearing small vessel infarcts in the right corona
radiata and posterior right parietal lobe cortex.
4. Mild paranasal sinus inflammation. Small volume retained
secretions in the posterior nasal cavity and pharynx.

## 2021-07-08 NOTE — Telephone Encounter (Signed)
Pts wife called stating pt is due for an appt. Per last office visit 02/2021 pt needed 36mf/u w/echo.  ? ?[facility] ?

## 2021-07-09 ENCOUNTER — Emergency Department (HOSPITAL_COMMUNITY): Payer: Medicare Other

## 2021-07-09 ENCOUNTER — Inpatient Hospital Stay (HOSPITAL_COMMUNITY)
Admission: EM | Admit: 2021-07-09 | Discharge: 2021-07-13 | DRG: 175 | Disposition: A | Discharge status: Home Health | Payer: Medicare Other | Attending: Family Medicine | Admitting: Family Medicine

## 2021-07-09 ENCOUNTER — Inpatient Hospital Stay (HOSPITAL_COMMUNITY): Payer: Medicare Other

## 2021-07-09 DIAGNOSIS — F17210 Nicotine dependence, cigarettes, uncomplicated: Secondary | ICD-10-CM | POA: Diagnosis present

## 2021-07-09 DIAGNOSIS — N183 Chronic kidney disease, stage 3 unspecified: Secondary | ICD-10-CM

## 2021-07-09 DIAGNOSIS — R569 Unspecified convulsions: Secondary | ICD-10-CM

## 2021-07-09 DIAGNOSIS — N1832 Chronic kidney disease, stage 3b: Secondary | ICD-10-CM | POA: Diagnosis present

## 2021-07-09 DIAGNOSIS — E039 Hypothyroidism, unspecified: Secondary | ICD-10-CM | POA: Diagnosis present

## 2021-07-09 DIAGNOSIS — R0902 Hypoxemia: Principal | ICD-10-CM

## 2021-07-09 DIAGNOSIS — I2510 Atherosclerotic heart disease of native coronary artery without angina pectoris: Secondary | ICD-10-CM | POA: Diagnosis present

## 2021-07-09 DIAGNOSIS — Z955 Presence of coronary angioplasty implant and graft: Secondary | ICD-10-CM

## 2021-07-09 DIAGNOSIS — D631 Anemia in chronic kidney disease: Secondary | ICD-10-CM | POA: Diagnosis present

## 2021-07-09 DIAGNOSIS — M349 Systemic sclerosis, unspecified: Secondary | ICD-10-CM | POA: Diagnosis present

## 2021-07-09 DIAGNOSIS — Z7189 Other specified counseling: Secondary | ICD-10-CM

## 2021-07-09 DIAGNOSIS — E876 Hypokalemia: Secondary | ICD-10-CM | POA: Diagnosis present

## 2021-07-09 DIAGNOSIS — F419 Anxiety disorder, unspecified: Secondary | ICD-10-CM | POA: Diagnosis present

## 2021-07-09 DIAGNOSIS — E43 Unspecified severe protein-calorie malnutrition: Secondary | ICD-10-CM | POA: Diagnosis present

## 2021-07-09 DIAGNOSIS — R040 Epistaxis: Secondary | ICD-10-CM | POA: Diagnosis not present

## 2021-07-09 DIAGNOSIS — L89323 Pressure ulcer of left buttock, stage 3: Secondary | ICD-10-CM | POA: Diagnosis present

## 2021-07-09 DIAGNOSIS — R7989 Other specified abnormal findings of blood chemistry: Secondary | ICD-10-CM | POA: Diagnosis present

## 2021-07-09 DIAGNOSIS — R0602 Shortness of breath: Secondary | ICD-10-CM

## 2021-07-09 DIAGNOSIS — I130 Hypertensive heart and chronic kidney disease with heart failure and stage 1 through stage 4 chronic kidney disease, or unspecified chronic kidney disease: Secondary | ICD-10-CM | POA: Diagnosis present

## 2021-07-09 DIAGNOSIS — L89153 Pressure ulcer of sacral region, stage 3: Secondary | ICD-10-CM

## 2021-07-09 DIAGNOSIS — R778 Other specified abnormalities of plasma proteins: Secondary | ICD-10-CM | POA: Diagnosis present

## 2021-07-09 DIAGNOSIS — R54 Age-related physical debility: Secondary | ICD-10-CM | POA: Diagnosis present

## 2021-07-09 DIAGNOSIS — Z841 Family history of disorders of kidney and ureter: Secondary | ICD-10-CM

## 2021-07-09 DIAGNOSIS — Z720 Tobacco use: Secondary | ICD-10-CM

## 2021-07-09 DIAGNOSIS — A0472 Enterocolitis due to Clostridium difficile, not specified as recurrent: Secondary | ICD-10-CM | POA: Diagnosis present

## 2021-07-09 DIAGNOSIS — I252 Old myocardial infarction: Secondary | ICD-10-CM

## 2021-07-09 DIAGNOSIS — L8993 Pressure ulcer of unspecified site, stage 3: Secondary | ICD-10-CM | POA: Diagnosis present

## 2021-07-09 DIAGNOSIS — J9801 Acute bronchospasm: Secondary | ICD-10-CM | POA: Diagnosis present

## 2021-07-09 DIAGNOSIS — Z79899 Other long term (current) drug therapy: Secondary | ICD-10-CM

## 2021-07-09 DIAGNOSIS — F17200 Nicotine dependence, unspecified, uncomplicated: Secondary | ICD-10-CM | POA: Diagnosis present

## 2021-07-09 DIAGNOSIS — I272 Pulmonary hypertension, unspecified: Secondary | ICD-10-CM | POA: Diagnosis present

## 2021-07-09 DIAGNOSIS — F05 Delirium due to known physiological condition: Secondary | ICD-10-CM | POA: Diagnosis not present

## 2021-07-09 DIAGNOSIS — I5043 Acute on chronic combined systolic (congestive) and diastolic (congestive) heart failure: Secondary | ICD-10-CM | POA: Diagnosis present

## 2021-07-09 DIAGNOSIS — Z885 Allergy status to narcotic agent status: Secondary | ICD-10-CM

## 2021-07-09 DIAGNOSIS — Z681 Body mass index (BMI) 19 or less, adult: Secondary | ICD-10-CM | POA: Diagnosis not present

## 2021-07-09 DIAGNOSIS — E785 Hyperlipidemia, unspecified: Secondary | ICD-10-CM | POA: Diagnosis present

## 2021-07-09 DIAGNOSIS — I96 Gangrene, not elsewhere classified: Secondary | ICD-10-CM | POA: Diagnosis not present

## 2021-07-09 DIAGNOSIS — Q998 Other specified chromosome abnormalities: Secondary | ICD-10-CM

## 2021-07-09 DIAGNOSIS — K219 Gastro-esophageal reflux disease without esophagitis: Secondary | ICD-10-CM | POA: Diagnosis present

## 2021-07-09 DIAGNOSIS — J9601 Acute respiratory failure with hypoxia: Secondary | ICD-10-CM | POA: Diagnosis not present

## 2021-07-09 DIAGNOSIS — J90 Pleural effusion, not elsewhere classified: Secondary | ICD-10-CM | POA: Diagnosis not present

## 2021-07-09 DIAGNOSIS — I7300 Raynaud's syndrome without gangrene: Secondary | ICD-10-CM | POA: Diagnosis present

## 2021-07-09 DIAGNOSIS — I2699 Other pulmonary embolism without acute cor pulmonale: Secondary | ICD-10-CM | POA: Diagnosis not present

## 2021-07-09 DIAGNOSIS — G40909 Epilepsy, unspecified, not intractable, without status epilepticus: Secondary | ICD-10-CM | POA: Diagnosis present

## 2021-07-09 LAB — I-STAT ARTERIAL BLOOD GAS, ED
Acid-Base Excess: 3 mmol/L — ABNORMAL HIGH (ref 0.0–2.0)
Bicarbonate: 26.7 mmol/L (ref 20.0–28.0)
Calcium, Ion: 1.1 mmol/L — ABNORMAL LOW (ref 1.15–1.40)
HCT: 21 % — ABNORMAL LOW (ref 39.0–52.0)
Hemoglobin: 7.1 g/dL — ABNORMAL LOW (ref 13.0–17.0)
O2 Saturation: 98 %
Patient temperature: 98
Potassium: 3.1 mmol/L — ABNORMAL LOW (ref 3.5–5.1)
Sodium: 144 mmol/L (ref 135–145)
TCO2: 28 mmol/L (ref 22–32)
pCO2 arterial: 37.5 mmHg (ref 32–48)
pH, Arterial: 7.46 — ABNORMAL HIGH (ref 7.35–7.45)
pO2, Arterial: 90 mmHg (ref 83–108)

## 2021-07-09 LAB — COMPREHENSIVE METABOLIC PANEL
ALT: 30 U/L (ref 0–44)
AST: 39 U/L (ref 15–41)
Albumin: 2.1 g/dL — ABNORMAL LOW (ref 3.5–5.0)
Alkaline Phosphatase: 94 U/L (ref 38–126)
Anion gap: 8 (ref 5–15)
BUN: 22 mg/dL (ref 8–23)
CO2: 27 mmol/L (ref 22–32)
Calcium: 7.5 mg/dL — ABNORMAL LOW (ref 8.9–10.3)
Chloride: 108 mmol/L (ref 98–111)
Creatinine, Ser: 1.83 mg/dL — ABNORMAL HIGH (ref 0.61–1.24)
GFR, Estimated: 41 mL/min — ABNORMAL LOW (ref >60)
Glucose, Bld: 91 mg/dL (ref 70–99)
Potassium: 3.2 mmol/L — ABNORMAL LOW (ref 3.5–5.1)
Sodium: 143 mmol/L (ref 135–145)
Total Bilirubin: 0.4 mg/dL (ref 0.3–1.2)
Total Protein: 5.7 g/dL — ABNORMAL LOW (ref 6.5–8.1)

## 2021-07-09 LAB — TROPONIN I (HIGH SENSITIVITY)
Troponin I (High Sensitivity): 41 ng/L — ABNORMAL HIGH (ref <18)
Troponin I (High Sensitivity): 60 ng/L — ABNORMAL HIGH (ref <18)

## 2021-07-09 LAB — CBC
HCT: 25.6 % — ABNORMAL LOW (ref 39.0–52.0)
Hemoglobin: 7.4 g/dL — ABNORMAL LOW (ref 13.0–17.0)
MCH: 29.7 pg (ref 26.0–34.0)
MCHC: 28.9 g/dL — ABNORMAL LOW (ref 30.0–36.0)
MCV: 102.8 fL — ABNORMAL HIGH (ref 80.0–100.0)
Platelets: 187 10*3/uL (ref 150–400)
RBC: 2.49 MIL/uL — ABNORMAL LOW (ref 4.22–5.81)
RDW: 18.1 % — ABNORMAL HIGH (ref 11.5–15.5)
WBC: 5.6 10*3/uL (ref 4.0–10.5)
nRBC: 0 % (ref 0.0–0.2)

## 2021-07-09 LAB — OCCULT BLOOD X 1 CARD TO LAB, STOOL: Fecal Occult Bld: POSITIVE — AB

## 2021-07-09 LAB — TSH: TSH: 7.285 u[IU]/mL — ABNORMAL HIGH (ref 0.350–4.500)

## 2021-07-09 LAB — LACTIC ACID, PLASMA
Lactic Acid, Venous: 1.1 mmol/L (ref 0.5–1.9)
Lactic Acid, Venous: 1.1 mmol/L (ref 0.5–1.9)

## 2021-07-09 LAB — D-DIMER, QUANTITATIVE: D-Dimer, Quant: 1.4 ug/mL-FEU — ABNORMAL HIGH (ref 0.00–0.50)

## 2021-07-09 LAB — BRAIN NATRIURETIC PEPTIDE: B Natriuretic Peptide: 971.7 pg/mL — ABNORMAL HIGH (ref 0.0–100.0)

## 2021-07-09 LAB — PREPARE RBC (CROSSMATCH)

## 2021-07-09 LAB — AMMONIA: Ammonia: 14 umol/L (ref 9–35)

## 2021-07-09 LAB — MAGNESIUM: Magnesium: 1.3 mg/dL — ABNORMAL LOW (ref 1.7–2.4)

## 2021-07-09 IMAGING — DX DG CHEST 1V PORT
1 series · 1 of 1 positions shown · non-contrast
Comparison: [DATE]

CLINICAL DATA: Shortness of breath.

EXAM:
PORTABLE CHEST 1 VIEW

[chest]
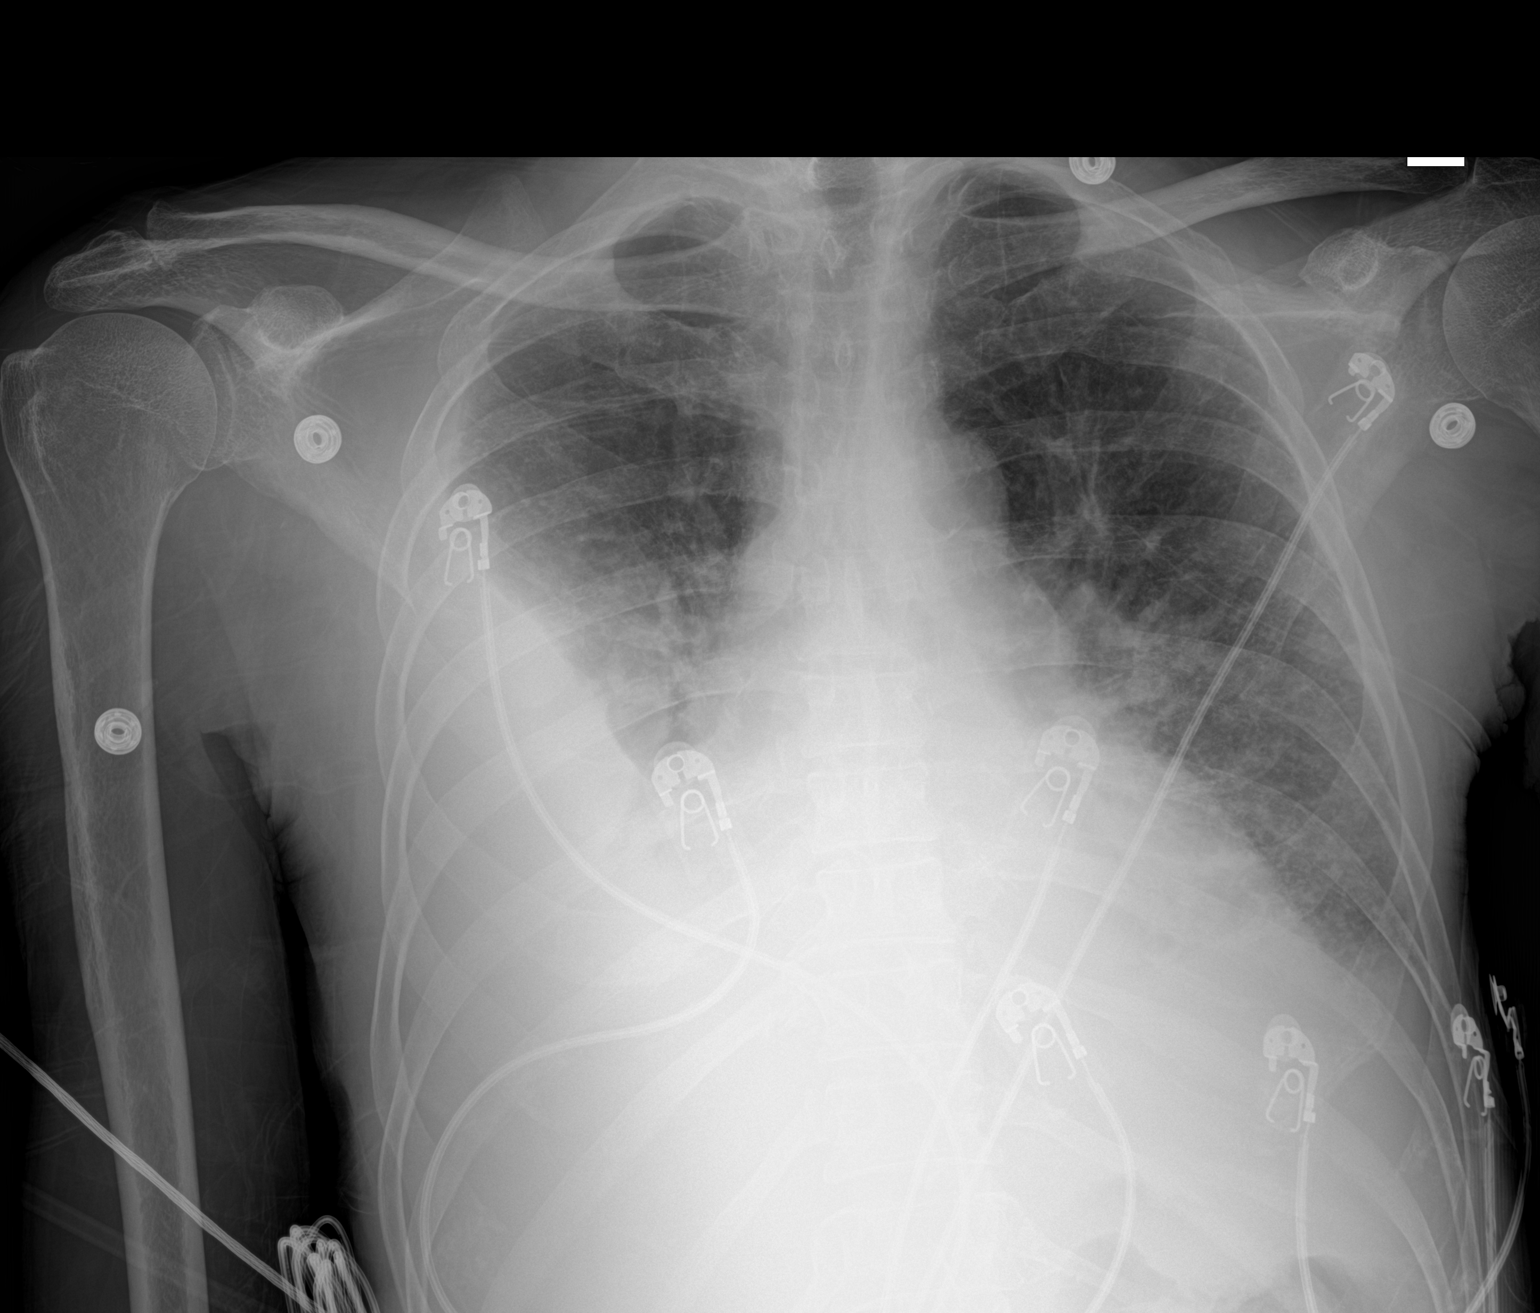

[1 of 1 positions shown; findings below may reference images not displayed]

FINDINGS: Cardiac silhouette is again moderately enlarged. There is again
moderate to high-grade right pleural effusion. Small left pleural
effusion. Associated bibasilar heterogeneous airspace opacities.
Mediastinal contours are grossly within normal limits with mild
calcification within aortic arch. No pneumothorax. No acute skeletal
abnormality.
IMPRESSION: Moderate to high-grade right and small left pleural effusions with
bibasilar airspace opacification may represent atelectasis versus
pneumonia, similar to prior.

## 2021-07-09 IMAGING — CT CT ANGIO CHEST
2 of 7 series · 16 of 46 positions shown · IV contrast (APPLIED)
Comparison: AP chest [DATE] [DATE]; PET-CT [DATE]; CT
abdomen and pelvis [DATE]

CLINICAL DATA: Positive D-dimer.  Pulmonary embolism suspected.

EXAM:
CT ANGIOGRAPHY CHEST WITH CONTRAST
TECHNIQUE: Multidetector CT imaging of the chest was performed using the
standard protocol during bolus administration of intravenous
contrast. Multiplanar CT image reconstructions and MIPs were
obtained to evaluate the vascular anatomy.

[Series 7: thins · axial · 0.71mm/px · z∈[+1256,+1528]mm · 13 of 439 slices shown]
[im 25/439  lung]
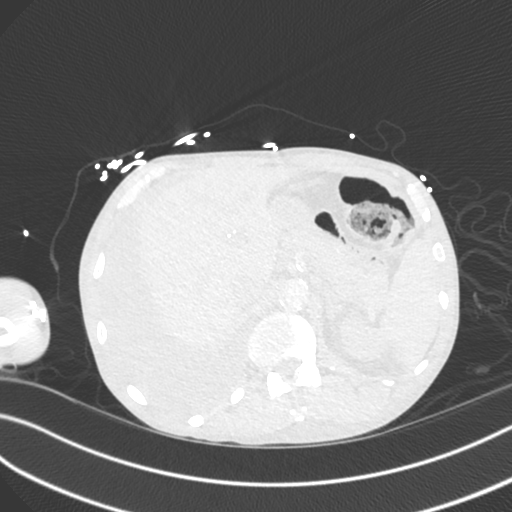
[im 49/439  soft-tissue]
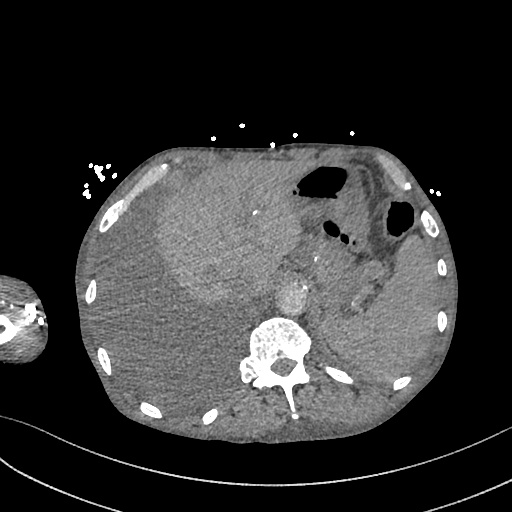
[im 98/439  lung]
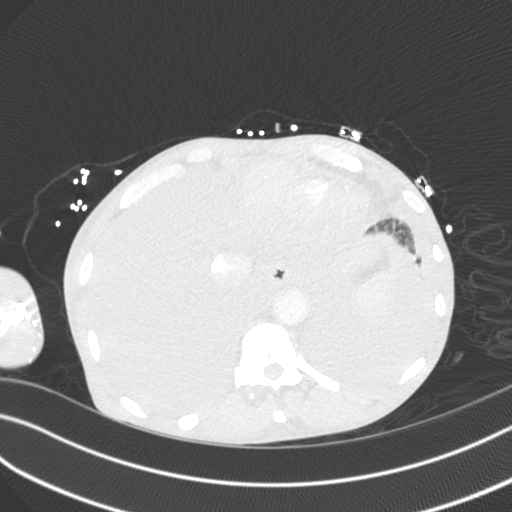
[im 122/439  soft-tissue]
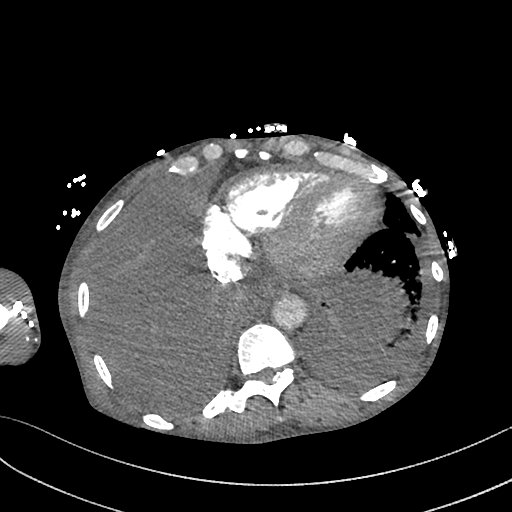
[im 147/439  lung]
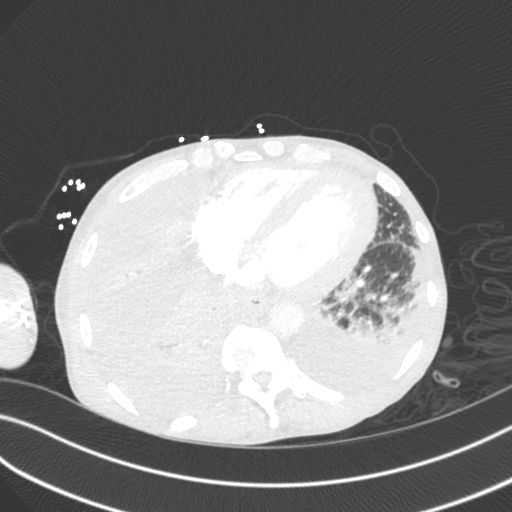
[im 195/439  soft-tissue]
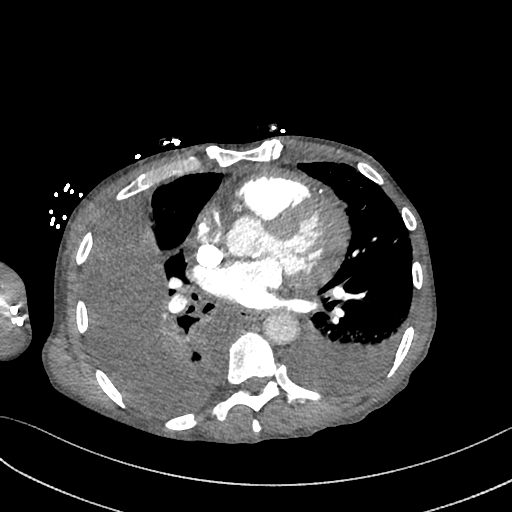
[im 220/439  lung]
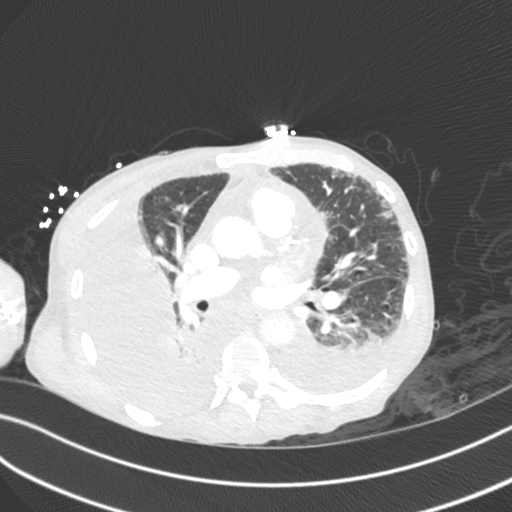
[im 244/439  soft-tissue]
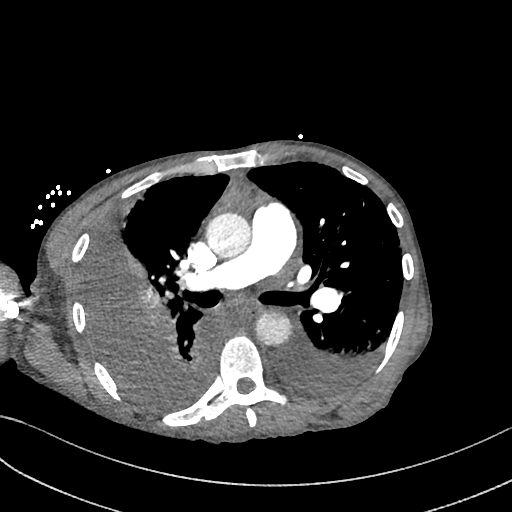
[im 293/439  lung]
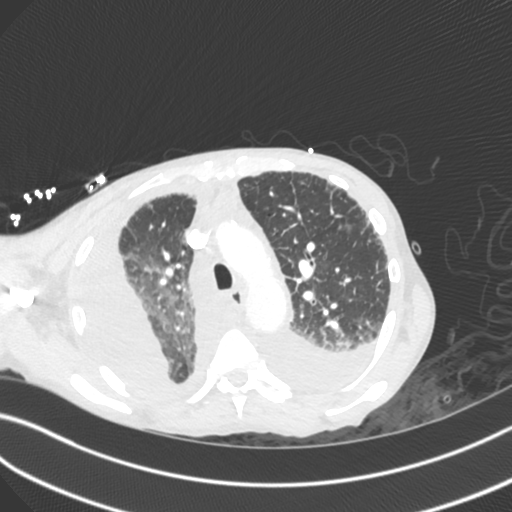
[im 317/439  soft-tissue]
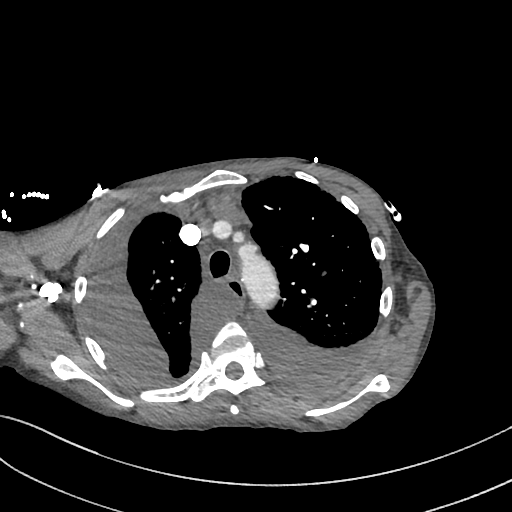
[im 341/439  lung]
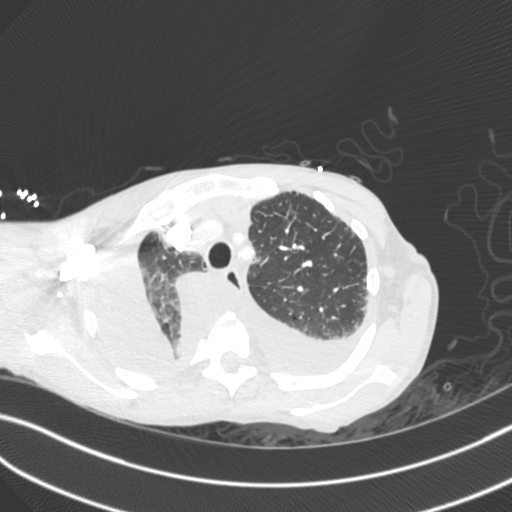
[im 390/439  soft-tissue]
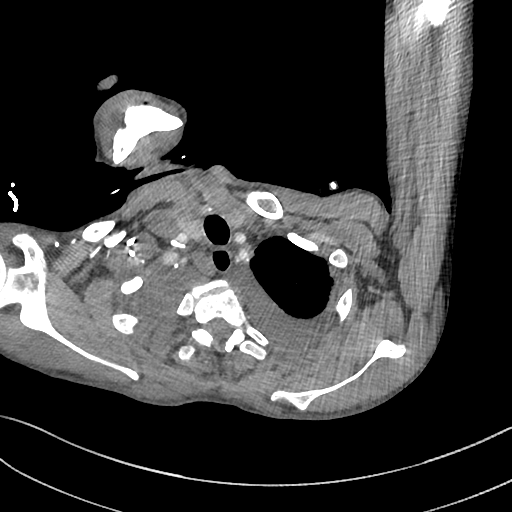
[im 414/439  lung]
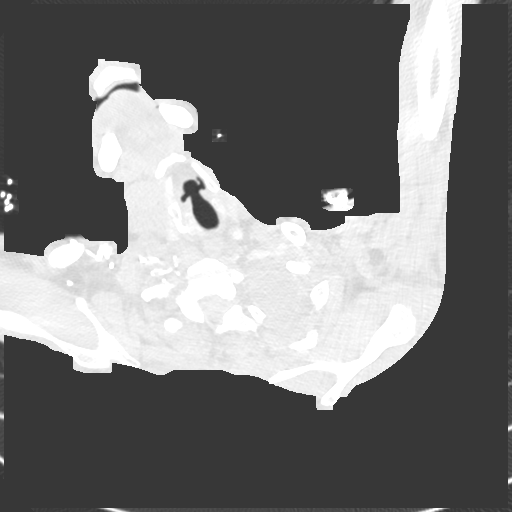

[Series 8: cor · coronal · 0.59mm/px · 3 of 134 slices shown]
[im 34/134  soft-tissue]
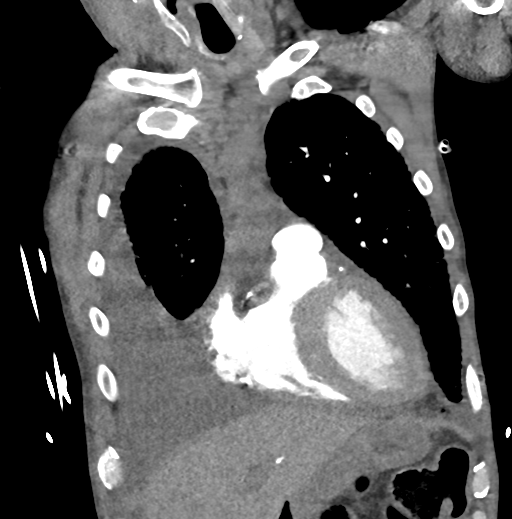
[im 67/134  soft-tissue]
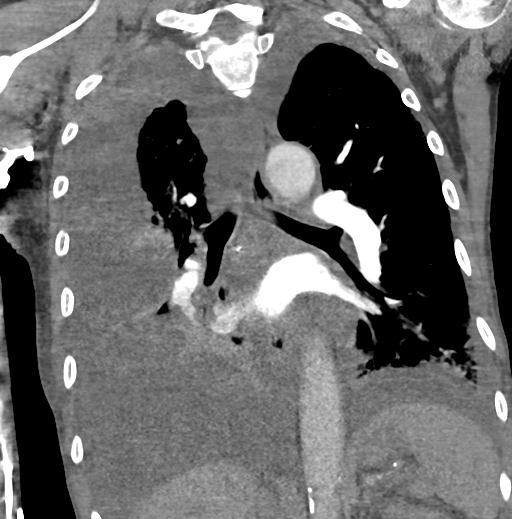
[im 100/134  soft-tissue]
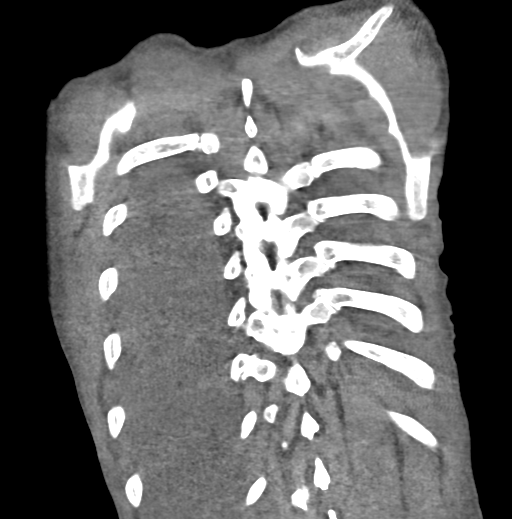

[16 of 46 positions shown; findings below may reference images not displayed]

RADIATION DOSE REDUCTION: This exam was performed according to the
departmental dose-optimization program which includes automated
exposure control, adjustment of the mA and/or kV according to
patient size and/or use of iterative reconstruction technique.

CONTRAST:  75mL OMNIPAQUE IOHEXOL 350 MG/ML SOLN
FINDINGS: Cardiovascular: The main pulmonary artery is opacified up to 439
Hounsfield units. There is low-density suspicious for a thrombus at
the junction of the right lower lobar pulmonary artery extending
into the posterior, lateral, anterior and medial segmental branches
embolism. Associated low-density minimally extends into the distal
aspect of the right main pulmonary artery. There may be punctate
thrombus within the anterior aspect of the proximal portion of the
right apical segmental pulmonary artery (axial image 66 of series
5). No definite left pulmonary artery embolism is seen. The main
pulmonary artery measures up to 3.2 cm, mildly enlarged as can be
seen with chronic pulmonary arterial hypertension.

No CT evidence of right-sided heart strain.

Moderate atherosclerotic calcifications within the thoracic aorta.
No thoracic aortic aneurysm. There are dense coronary artery
calcifications. Heart size is mildly enlarged. No pericardial
effusion.

Mediastinum/Nodes: No axillary lymphadenopathy. Note is made the
prior PET-CT was performed for further evaluation of nodularity
within the anterior mediastinum demonstrating only low metabolic
activity and favored to be benign lymph nodes. Multiple anterior
mediastinal nodules are again seen. An 8 mm short axis likely lymph
node (axial series 5, image 54) is unchanged from [DATE] PET-CT.
Adjacent more left-sided anterior mediastinal 8 mm short axis lymph
node (axial series 5, image 55) is also unchanged. Soft tissue
density just inferior to these nodules appears unchanged from prior
PET-CT. No definite hilar lymphadenopathy is seen. 1

The visualized thyroid is grossly unremarkable. The esophagus
follows a normal course and normal caliber.

Lungs/Pleura: The central airways are patent. There is interval
increase in now large right and moderate left pleural effusions
compared to [DATE] prior PET-CT. The right pleural fluid extends
along the lateral greater than medial pleura and mildly into the
anterior pleura suggesting loculated pleural fluid. Moderate
associated right lung volume loss. There is associated narrowing of
the right lower lobe segmental airways. Diffuse right and patchy
posterior left ground-glass opacities likely subsegmental
atelectasis and/or pulmonary edema. No pneumothorax.

Upper Abdomen: There are scattered calcifications again seen within
the liver and spleen, possibly the sequela of remote granulomatous
infection.

Musculoskeletal: Mild multilevel degenerative disc changes of the
thoracic spine. No aggressive lytic or blastic osseous lesion is
seen.

Review of the MIP images confirms the above findings.
IMPRESSION: :
IMPRESSION: 1. Pulmonary embolism within the branch points of the right lower
lobar pulmonary artery extending into the posterolateral anterior
and medial segmental branches. Possible punctate thrombus within the
proximal right apical segmental pulmonary artery.
2. Stable small lymph nodes within the anterior mediastinum
demonstrating only mild uptake on prior PET and favored to be
benign.
3. Interval worsening of now large right and moderate left pleural
effusions with probable right-sided loculations. Associated moderate
right lung volume loss. Right-greater-than-left ground-glass
subsegmental atelectasis and/or pulmonary edema. Although no
definite focal lung consolidation is seen, it is difficult to
exclude underlying infection.

Critical Value/emergent results were called by telephone at the time
of interpretation on [DATE] at [DATE] to provider MOOLMAN
MOOLMAN , who verbally acknowledged these results.

Aortic Atherosclerosis ([G9]-[G9]).

## 2021-07-09 MED ORDER — LACOSAMIDE 50 MG PO TABS
100.0000 mg | ORAL_TABLET | Freq: Two times a day (BID) | ORAL | Status: DC
Start: 2021-07-09 — End: 2021-07-13
  Administered 2021-07-09 – 2021-07-13 (×9): 100 mg via ORAL
  Filled 2021-07-09 (×9): qty 2

## 2021-07-09 MED ORDER — CALCIUM CARBONATE ANTACID 500 MG PO CHEW
1.0000 | CHEWABLE_TABLET | Freq: Two times a day (BID) | ORAL | Status: DC
  Administered 2021-07-09 – 2021-07-13 (×7): 200 mg via ORAL
  Filled 2021-07-09 (×8): qty 1

## 2021-07-09 MED ORDER — QUETIAPINE FUMARATE 50 MG PO TABS
25.0000 mg | ORAL_TABLET | Freq: Every day | ORAL | Status: DC
  Administered 2021-07-09 – 2021-07-12 (×4): 25 mg via ORAL
  Filled 2021-07-09 (×5): qty 1

## 2021-07-09 MED ORDER — IOHEXOL 350 MG/ML SOLN
75.0000 mL | Freq: Once | INTRAVENOUS | Status: AC | PRN
  Administered 2021-07-09: 75 mL via INTRAVENOUS

## 2021-07-09 MED ORDER — NICOTINE 21 MG/24HR TD PT24
21.0000 mg | MEDICATED_PATCH | Freq: Every day | TRANSDERMAL | Status: DC | PRN
Start: 2021-07-09 — End: 2021-07-13

## 2021-07-09 MED ORDER — SODIUM BICARBONATE 650 MG PO TABS
1300.0000 mg | ORAL_TABLET | Freq: Three times a day (TID) | ORAL | Status: DC
  Administered 2021-07-09 – 2021-07-13 (×14): 1300 mg via ORAL
  Filled 2021-07-09 (×14): qty 2

## 2021-07-09 MED ORDER — FUROSEMIDE 10 MG/ML IJ SOLN
40.0000 mg | Freq: Two times a day (BID) | INTRAMUSCULAR | Status: DC
  Administered 2021-07-09 – 2021-07-11 (×5): 40 mg via INTRAVENOUS
  Filled 2021-07-09 (×5): qty 4

## 2021-07-09 MED ORDER — MACITENTAN 10 MG PO TABS
10.0000 mg | ORAL_TABLET | Freq: Every day | ORAL | Status: DC
  Administered 2021-07-09 – 2021-07-13 (×5): 10 mg via ORAL
  Filled 2021-07-09 (×6): qty 1

## 2021-07-09 MED ORDER — ENOXAPARIN SODIUM 30 MG/0.3ML IJ SOSY: 30.0000 mg | PREFILLED_SYRINGE | INTRAMUSCULAR | Status: DC

## 2021-07-09 MED ORDER — SODIUM CHLORIDE 0.9% FLUSH
3.0000 mL | Freq: Two times a day (BID) | INTRAVENOUS | Status: DC
  Administered 2021-07-09 – 2021-07-13 (×4): 3 mL via INTRAVENOUS

## 2021-07-09 MED ORDER — HEPARIN (PORCINE) 25000 UT/250ML-% IV SOLN
1150.0000 [IU]/h | INTRAVENOUS | Status: DC
  Administered 2021-07-09: 750 [IU]/h via INTRAVENOUS
  Administered 2021-07-10: 1050 [IU]/h via INTRAVENOUS
  Administered 2021-07-11 – 2021-07-12 (×2): 1150 [IU]/h via INTRAVENOUS
  Filled 2021-07-09 (×4): qty 250

## 2021-07-09 MED ORDER — HYDRALAZINE HCL 20 MG/ML IJ SOLN: 5.0000 mg | Freq: Four times a day (QID) | INTRAMUSCULAR | Status: DC | PRN

## 2021-07-09 MED ORDER — ROSUVASTATIN CALCIUM 20 MG PO TABS
20.0000 mg | ORAL_TABLET | Freq: Every day | ORAL | Status: DC
  Administered 2021-07-09 – 2021-07-13 (×5): 20 mg via ORAL
  Filled 2021-07-09 (×5): qty 1

## 2021-07-09 MED ORDER — POTASSIUM CHLORIDE CRYS ER 20 MEQ PO TBCR
40.0000 meq | EXTENDED_RELEASE_TABLET | ORAL | Status: AC
  Administered 2021-07-09: 40 meq via ORAL
  Filled 2021-07-09: qty 2

## 2021-07-09 MED ORDER — ACETAMINOPHEN 325 MG PO TABS
650.0000 mg | ORAL_TABLET | Freq: Four times a day (QID) | ORAL | Status: DC | PRN
  Administered 2021-07-13: 650 mg via ORAL
  Filled 2021-07-09 (×3): qty 2

## 2021-07-09 MED ORDER — ONDANSETRON HCL 4 MG/2ML IJ SOLN: 4.0000 mg | Freq: Four times a day (QID) | INTRAMUSCULAR | Status: DC | PRN

## 2021-07-09 MED ORDER — TORSEMIDE 20 MG PO TABS
20.0000 mg | ORAL_TABLET | Freq: Every day | ORAL | Status: DC
  Administered 2021-07-09 – 2021-07-10 (×2): 20 mg via ORAL
  Filled 2021-07-09 (×2): qty 1

## 2021-07-09 MED ORDER — FIDAXOMICIN 200 MG PO TABS
200.0000 mg | ORAL_TABLET | Freq: Two times a day (BID) | ORAL | Status: DC
  Administered 2021-07-09 – 2021-07-13 (×8): 200 mg via ORAL
  Filled 2021-07-09 (×10): qty 1

## 2021-07-09 MED ORDER — ACETAMINOPHEN 650 MG RE SUPP: 650.0000 mg | Freq: Four times a day (QID) | RECTAL | Status: DC | PRN

## 2021-07-09 MED ORDER — SODIUM CHLORIDE 0.9% IV SOLUTION
Freq: Once | INTRAVENOUS | Status: AC
  Administered 2021-07-09: 40 mL via INTRAVENOUS

## 2021-07-09 MED ORDER — PANTOPRAZOLE SODIUM 40 MG PO TBEC
40.0000 mg | DELAYED_RELEASE_TABLET | Freq: Every day | ORAL | Status: DC
  Administered 2021-07-09 – 2021-07-13 (×5): 40 mg via ORAL
  Filled 2021-07-09 (×5): qty 1

## 2021-07-09 MED ORDER — ONDANSETRON HCL 4 MG PO TABS: 4.0000 mg | ORAL_TABLET | Freq: Four times a day (QID) | ORAL | Status: DC | PRN

## 2021-07-09 MED ORDER — SILDENAFIL CITRATE 20 MG PO TABS
40.0000 mg | ORAL_TABLET | Freq: Three times a day (TID) | ORAL | Status: DC
  Administered 2021-07-09 – 2021-07-13 (×14): 40 mg via ORAL
  Filled 2021-07-09 (×18): qty 2

## 2021-07-09 MED ORDER — ALBUTEROL SULFATE (2.5 MG/3ML) 0.083% IN NEBU
2.5000 mg | INHALATION_SOLUTION | Freq: Four times a day (QID) | RESPIRATORY_TRACT | Status: DC | PRN
  Administered 2021-07-10: 2.5 mg via RESPIRATORY_TRACT
  Filled 2021-07-09: qty 3

## 2021-07-09 MED ORDER — CALCIUM GLUCONATE-NACL 2-0.675 GM/100ML-% IV SOLN
2.0000 g | Freq: Once | INTRAVENOUS | Status: AC
  Administered 2021-07-09: 2000 mg via INTRAVENOUS
  Filled 2021-07-09: qty 100

## 2021-07-09 NOTE — Assessment & Plan Note (Addendum)
-  Transition to eliquis 2.64m po BID. Extensive discussions undertaken among hospitalist and consultants with family. This dose is not based on guidelines, but tailored to this particular patient. Pt and spouse agree to proceed, understanding rationale of lower-than-typical dosing.  IV heparin started, will treat this as the load of anticoagulation due to the patient's exceptionally high risk of life threatening bleed at home (both due to GI bleed history and frailty/diminished physiologic reserve). LE venous U/S negative for DVT. ?- If he bleeds, would again revisit IVC filter discussions. Despite high risk for anticoagulation, he already has multifactorial pulmonary HTN and should continue AC if able. ?

## 2021-07-09 NOTE — Assessment & Plan Note (Addendum)
On physical exam patient noted to have signs of tunneling of the pressure ulcer left glute, but uninfected. ?-Low-air-loss mattress replaced ?-Wound care consult ?

## 2021-07-09 NOTE — Progress Notes (Signed)
ANTICOAGULATION CONSULT NOTE - Initial Consult ? ?Pharmacy Consult for heparin ?Indication: pulmonary embolus ? ?Allergies  ?Allergen Reactions  ? Dilantin [Phenytoin]   ?  Made him confused, manic  ? Oxycodone Other (See Comments)  ?  Hallucinations  ? ? ?Patient Measurements: ?Height: _0  (162.6 cm) ?Weight: 47 kg (103 lb 9.9 oz) ?IBW/kg (Calculated) : 59.2 ?Heparin Dosing Weight: TBW ? ?Vital Signs: ?Temp: 98.6 ?F (37 ?C) (03/18 1545) ?Temp Source: Oral (03/18 1545) ?BP: 178/87 (03/18 1545) ?Pulse Rate: 100 (03/18 1545) ? ?Labs: ?Recent Labs  ?  07/09/21 ?1011 07/09/21 ?1046 07/09/21 ?1225 07/09/21 ?1305  ?HGB 7.4*  --  7.1*  --   ?HCT 25.6*  --  21.0*  --   ?PLT 187  --   --   --   ?CREATININE 1.83*  --   --   --   ?TROPONINIHS  --  41*  --  60*  ? ? ?Estimated Creatinine Clearance: 27.8 mL/min (A) (by C-G formula based on SCr of 1.83 mg/dL (H)). ? ? ?Medical History: ?Past Medical History:  ?Diagnosis Date  ? (HFpEF) heart failure with preserved ejection fraction ([place])   ? Anxiety   ? CAD (coronary artery disease)   ? CKD (chronic kidney disease), stage IV ([place])   ? Gout   ? Hyperlipidemia   ? Hypertension 05/14/2012  ?  Lexiscan-- EF 51% ,LV normal  ? Hypothyroid   ? MI (myocardial infarction) ([place]) 2010  ? Pericardial effusion 12/2008  ? Dr [name] performed a subxiphoid window removing 252m of fluid  ? Pericardial effusion 03/09/2010  ? Echo-LVEF >55%, very small pericardial effusion ,,Stage 1 (impaired ) diastolic fxn, elevated LV filling  ? Pericarditis   ? Raynaud's phenomenon   ? Scleroderma ([place]   ? Seizures ([place]   ? Smoker 09/16/2018  ? 1 ppd  ? Vitiligo   ? ? ?Medications:  ?Medications Prior to Admission  ?Medication Sig Dispense Refill Last Dose  ? allopurinol (ZYLOPRIM) 100 MG tablet TAKE 1 TABLET BY MOUTH EVERY DAY (Patient taking differently: Take 100 mg by mouth daily.) 30 tablet 2 07/08/2021  ? calcium carbonate (TUMS - DOSED IN MG ELEMENTAL CALCIUM) 500 MG chewable tablet Chew 1 tablet  by mouth 2 (two) times daily with a meal. 20 tablet 0 07/08/2021  ? Epoetin Alfa-epbx (RETACRIT IJ) Inject as directed every 30 (thirty) days.     ? feeding supplement (ENSURE ENLIVE / ENSURE PLUS) LIQD Take 237 mLs by mouth 3 (three) times daily between meals. 237 mL 12 UNK  ? Lacosamide 100 MG TABS Take 1 tablet twice a day (Patient taking differently: Take 100 mg by mouth 2 (two) times daily. Take 1 tablet twice a day) 60 tablet 5 07/08/2021  ? macitentan (OPSUMIT) 10 MG tablet Take 1 tablet (10 mg total) by mouth daily. 90 tablet 3 07/08/2021  ? multivitamin-iron-minerals-folic acid (CENTRUM) chewable tablet Chew 1 tablet by mouth daily.   07/08/2021  ? pantoprazole (PROTONIX) 40 MG tablet Take 1 tablet (40 mg total) by mouth daily for 90 doses. 90 tablet 3 07/08/2021  ? Potassium Chloride ER 20 MEQ TBCR Take 1 tablet by mouth daily.   07/08/2021  ? QUEtiapine (SEROQUEL) 25 MG tablet Take 1 tablet (25 mg total) by mouth at bedtime. 30 tablet 1 Past Week  ? rosuvastatin (CRESTOR) 20 MG tablet Take 20 mg by mouth daily.   07/08/2021  ? sildenafil (REVATIO) 20 MG tablet Take 2 tablets (40 mg total) by mouth  3 (three) times daily. (Patient taking differently: Take 40 mg by mouth in the morning and at bedtime.) 180 tablet 11 07/08/2021  ? sodium bicarbonate 650 MG tablet Take 2 tablets (1,300 mg total) by mouth 3 (three) times daily. 30 tablet 0 07/08/2021  ? torsemide (DEMADEX) 20 MG tablet Take 20 mg by mouth daily.   07/08/2021  ? ? ?Assessment: ?34 yom who presents to the ED with SOB and chest pressure. He has a hx PAH. CTA chest reveals acute PE with possible punctate punctate thrombus within the proximal right apical segmental pulmonary artery. He does not take AC PTA. Pharmacy consulted to begin IV heparin. ? ?CKD noted, SCr 1.83 and appears lower than baseline. Hgb low at 7.1 - transfusing 1 unit PRBC, platelets are normal. Clarified no heparin bolus given anemia per Dr Tamala Julian. ? ?Goal of Therapy:  ?Heparin level  0.3-0.7 units/ml ?Monitor platelets by anticoagulation protocol: Yes ?  ?Plan:  ?- Begin heparin infusion at 750 units/hr - no bolus ?- 8 hr heparin level ?- Daily heparin level and CBC ?- Monitor for signs/symptoms of bleeding ? ?Thank you for involving pharmacy in this patient's care. ? ?Renold Genta, PharmD, BCPS ?Clinical Pharmacist ?Clinical phone for 07/09/2021 until 3p is [phone] ?07/09/2021 4:03 PM ? ?**Pharmacist phone directory can be found on [email or web address] listed under [place]** ? ? ? ?

## 2021-07-09 NOTE — Assessment & Plan Note (Addendum)
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  ?- Avoid nephrotoxins, monitor BMP in AM ?- Follow up with nephrology, Dr. Joelyn Oms, as outpatient. ?

## 2021-07-09 NOTE — ED Triage Notes (Addendum)
BIB [facility], pt had intermittent SOB worse overnight. This AM SOB with substernal chest pressure. Fire placed 15L NRB, EMS changed to 2LPM per [place] with relief of SOB and chest pressure. Pt GCS 15, 20 L AC. Pt reports minimal PO intake. Initial HR 130, EMS gave small fluid bolus, improved to 110. ? ?110HR, 180/110 BP, 18R, 99% 2L, CBG 130 ?
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
## 2021-07-09 NOTE — Assessment & Plan Note (Addendum)
No home medications at this time. ?

## 2021-07-09 NOTE — Progress Notes (Signed)
Pharmacy Consult for Pulmonary Hypertension Treatment  ? ?Indication - Continuation of prior to admission medication  ? ?Patient is 61 y.o.  with history of PAH on chronic Macitentan (Opsumit) PTA and will be continued while hospitalized.  ? ?Continuing this medication order as an inpatient requires that monitoring parameters per REMS requirements must be met. ? ?Chronic therapy is under the supervision of [place] who is enrolled in the REMS program and is being notified of continuation of therapy. A staff message in EPIC has been sent notifying the certified prescriber.  ?Per patient report has previously been educated on Hepatotoxicity .  ?Hepatic function has been evaluated. AST/ALT appropriate to continue medication at this time. ? ?Hepatic Function Latest Ref Rng & Units 06/13/2021 06/10/2021 06/08/2021  ?Total Protein 6.5 - 8.1 g/dL - - 5.4(L)  ?Albumin 3.5 - 5.0 g/dL 2.8(L) 2.1(L) 1.8(L)  ?AST 15 - 41 U/L - - 20  ?ALT 0 - 44 U/L - - 15  ?Alk Phosphatase 38 - 126 U/L - - 66  ?Total Bilirubin 0.3 - 1.2 mg/dL - - 0.2(L)  ?Bilirubin, Direct 0.0 - 0.2 mg/dL - - -  ? ? ?If any question arise or pregnancy is identified during hospitalization, contact for bosentan and macitentan: [phone]; ambrisentan: [phone]. ? ?Thank for you allowing [place] to participate in the care of this patient. ? ?Bertis Ruddy, PharmD ?Clinical Pharmacist ?ED Pharmacist Phone # [phone] ?07/09/2021 10:59 AM ? ?
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
## 2021-07-09 NOTE — Assessment & Plan Note (Addendum)
Resolved with supplementation.  ?- Recheck at follow up ?

## 2021-07-09 NOTE — Assessment & Plan Note (Addendum)
New/enlarging pleural effusions, BNP 971.1. Echo w/LVEF 40-45%, global hypokinesis, G2DD in Feb 2023.  ?- Converted lasix 7m IV BID back to home torsemide ?- Cardiology consultant signed off.   ?- Weights, I/O, BMP ?- CKD limits ACE/ARB/ARNI/spiro ?

## 2021-07-09 NOTE — Assessment & Plan Note (Addendum)
Patient had just had stool studies checked by his PCP was noted to be still positive for C. difficile with reported symptoms. Recommended to start Dificid 200 mg twice daily, but was not available at his pharmacy until Tuesday. ?- Started dificid on admission, will continue.  ?- IP recommends enteric isolation. Symptoms resolved. ?

## 2021-07-09 NOTE — Progress Notes (Signed)
ABG ordered and obtained while patient was on 2L [place].  No further changes at this time.  ? ? Latest Reference Range & Units 07/09/21 12:25  ?Sample type  ARTERIAL  ?pH, Arterial 7.35 - 7.45  7.460 (H)  ?pCO2 arterial 32 - 48 mmHg 37.5  ?pO2, Arterial 83 - 108 mmHg 90  ?TCO2 22 - 32 mmol/L 28  ?Acid-Base Excess 0.0 - 2.0 mmol/L 3.0 (H)  ?Bicarbonate 20.0 - 28.0 mmol/L 26.7  ?O2 Saturation % 98  ?Patient temperature  98.0 F  ?Collection site  RADIAL, ALLEN'S TEST ACCEPTABLE  ? ?

## 2021-07-09 NOTE — Assessment & Plan Note (Addendum)
Patient had just recently been hospitalized for seizures last month.   ?- Continue lacosamide 100 mg twice daily ?

## 2021-07-09 NOTE — Assessment & Plan Note (Addendum)
Acute on chronic.  Patient has required blood transfusions in the past.  No reports of bleeding ?- s/p 1u PRBCs, will administer another for hgb 7.1g/dl on 3/22. Recheck CBC at follow up ?- ESA dosed on day of discharge after discussion with nephrology, Dr. Candiss Norse. ?- Despite hx AVMs, there has been no GI bleeding or other uncontrolled bleeding noted. ?

## 2021-07-09 NOTE — Assessment & Plan Note (Addendum)
Acute.  High-sensitivity troponin 41->60.  Patient reports chest discomfort has resolved.  Suspect secondary to demand in setting of pulmonary embolus and CHF exacerbation. ?- No inpatient work up planned per my discussion with cardiology, Dr. Acie Fredrickson. ?

## 2021-07-09 NOTE — Assessment & Plan Note (Addendum)
Followed by cardiology ?- Continue macitentan and sildenafil. Continuing anticoagulation as above to minimize CTEPH. ?

## 2021-07-09 NOTE — ED Provider Notes (Signed)
?[facility] ?Provider Note ? ? ?CSN: [id number] ?Arrival date & time: 07/09/21  9381 ? ?  ? ?History ? ?Chief Complaint  ?Patient presents with  ? Shortness of Breath  ? ? ?[name] is a 61 y.o. male. ? ?The history is provided by the patient, a relative and medical records. No language interpreter was used.  ?Shortness of Breath ?Severity:  Severe ?Onset quality:  Gradual ?Timing:  Constant ?Progression:  Waxing and waning ?Chronicity:  New ?Context: URI   ?Relieved by:  Nothing ?Worsened by:  Nothing ?Ineffective treatments:  None tried ?Associated symptoms: chest pain (improved) and cough   ?Associated symptoms: no abdominal pain, no diaphoresis, no fever, no headaches, no hemoptysis, no neck pain, no rash, no sputum production, no vomiting and no wheezing   ?Risk factors: no hx of PE/DVT   ? ?  ? ?Home Medications ?Prior to Admission medications   ?Medication Sig Start Date End Date Taking? Authorizing Provider  ?allopurinol (ZYLOPRIM) 100 MG tablet TAKE 1 TABLET BY MOUTH EVERY DAY ?Patient taking differently: Take 100 mg by mouth daily. 05/09/21   Bo Merino, MD  ?calcium carbonate (TUMS - DOSED IN MG ELEMENTAL CALCIUM) 500 MG chewable tablet Chew 1 tablet by mouth 2 (two) times daily with a meal. 05/20/21   Regalado, Belkys A, MD  ?colchicine 0.6 MG tablet Take 0.6 mg by mouth daily as needed (gout attacks). ?Patient not taking: Reported on 06/21/2021    [provider]  ?Epoetin Alfa-epbx (RETACRIT IJ) Inject as directed every 30 (thirty) days. ?Patient not taking: Reported on 06/21/2021    [provider]  ?feeding supplement (ENSURE ENLIVE / ENSURE PLUS) LIQD Take 237 mLs by mouth 3 (three) times daily between meals. 06/15/21   Hosie Poisson, MD  ?Lacosamide 100 MG TABS Take 1 tablet twice a day 07/05/21   Cameron Sprang, MD  ?macitentan (OPSUMIT) 10 MG tablet Take 1 tablet (10 mg total) by mouth daily. 07/19/20   Bensimhon, Shaune Pascal, MD   ?multivitamin-iron-minerals-folic acid (CENTRUM) chewable tablet Chew 1 tablet by mouth daily.    [provider]  ?pantoprazole (PROTONIX) 40 MG tablet Take 1 tablet (40 mg total) by mouth daily for 90 doses. 04/27/21 07/26/21  Cirigliano, Dominic Pea, DO  ?QUEtiapine (SEROQUEL) 25 MG tablet Take 1 tablet (25 mg total) by mouth at bedtime. 06/15/21   Hosie Poisson, MD  ?rosuvastatin (CRESTOR) 20 MG tablet Take 20 mg by mouth daily. 09/03/20   [provider]  ?sildenafil (REVATIO) 20 MG tablet Take 2 tablets (40 mg total) by mouth 3 (three) times daily. ?Patient taking differently: Take 40 mg by mouth in the morning and at bedtime. 01/03/21 01/03/22  Ledora Bottcher, PA  ?sodium bicarbonate 650 MG tablet Take 2 tablets (1,300 mg total) by mouth 3 (three) times daily. 05/23/21   Regalado, Belkys A, MD  ?torsemide (DEMADEX) 20 MG tablet Take 20 mg by mouth daily.    [provider]  ?   ? ?Allergies    ?Oxycodone   ? ?Review of Systems   ?Review of Systems  ?Constitutional:  Positive for fatigue. Negative for chills, diaphoresis and fever.  ?HENT:  Negative for congestion.   ?Respiratory:  Positive for cough, chest tightness and shortness of breath. Negative for hemoptysis, sputum production and wheezing.   ?Cardiovascular:  Positive for chest pain (improved).  ?Gastrointestinal:  Negative for abdominal pain, constipation, diarrhea, nausea and vomiting.  ?Genitourinary:  Negative for flank  pain.  ?Musculoskeletal:  Negative for back pain, neck pain and neck stiffness.  ?Skin:  Negative for rash and wound.  ?Neurological:  Negative for dizziness, weakness, light-headedness, numbness and headaches.  ?Psychiatric/Behavioral:  Negative for agitation and confusion.   ?All other systems reviewed and are negative. ? ?Physical Exam ?Updated Vital Signs ?BP (!) 170/101 (BP Location: Left Arm)   Pulse (!) 113   Temp 98 ?F (36.7 ?C) (Oral)   Resp 20   Ht _0  (1.626 m)   Wt 47 kg   SpO2 95%   BMI 17.79  kg/m?  ?Physical Exam ?Vitals and nursing note reviewed.  ?Constitutional:   ?   General: He is not in acute distress. ?   Appearance: He is well-developed. He is ill-appearing. He is not toxic-appearing or diaphoretic.  ?HENT:  ?   Head: Normocephalic and atraumatic.  ?   Mouth/Throat:  ?   Mouth: Mucous membranes are moist.  ?Eyes:  ?   Conjunctiva/sclera: Conjunctivae normal.  ?   Pupils: Pupils are equal, round, and reactive to light.  ?Cardiovascular:  ?   Rate and Rhythm: Regular rhythm. Tachycardia present.  ?   Heart sounds: No murmur heard. ?Pulmonary:  ?   Effort: Pulmonary effort is normal. Tachypnea present. No respiratory distress.  ?   Breath sounds: Rhonchi and rales present.  ?Chest:  ?   Chest wall: No tenderness.  ?Abdominal:  ?   Palpations: Abdomen is soft.  ?   Tenderness: There is no abdominal tenderness.  ?Musculoskeletal:     ?   General: No swelling.  ?   Cervical back: Neck supple.  ?   Right lower leg: No edema.  ?   Left lower leg: No edema.  ?Skin: ?   General: Skin is warm and dry.  ?   Capillary Refill: Capillary refill takes less than 2 seconds.  ?   Findings: No erythema.  ?Neurological:  ?   General: No focal deficit present.  ?   Mental Status: He is alert.  ?Psychiatric:     ?   Mood and Affect: Mood normal.  ? ? ?ED Results / Procedures / Treatments   ?Labs ?(all labs ordered are listed, but only abnormal results are displayed) ?Labs Reviewed  ?COMPREHENSIVE METABOLIC PANEL - Abnormal; Notable for the following components:  ?    Result Value  ? Potassium 3.2 (*)   ? Creatinine, Ser 1.83 (*)   ? Calcium 7.5 (*)   ? Total Protein 5.7 (*)   ? Albumin 2.1 (*)   ? GFR, Estimated 41 (*)   ? All other components within normal limits  ?CBC - Abnormal; Notable for the following components:  ? RBC 2.49 (*)   ? Hemoglobin 7.4 (*)   ? HCT 25.6 (*)   ? MCV 102.8 (*)   ? MCHC 28.9 (*)   ? RDW 18.1 (*)   ? All other components within normal limits  ?BRAIN NATRIURETIC PEPTIDE - Abnormal; Notable  for the following components:  ? B Natriuretic Peptide 971.7 (*)   ? All other components within normal limits  ?D-DIMER, QUANTITATIVE - Abnormal; Notable for the following components:  ? D-Dimer, Quant 1.40 (*)   ? All other components within normal limits  ?I-STAT ARTERIAL BLOOD GAS, ED - Abnormal; Notable for the following components:  ? pH, Arterial 7.460 (*)   ? Acid-Base Excess 3.0 (*)   ? Potassium 3.1 (*)   ? Calcium, Ion 1.10 (*)   ?  HCT 21.0 (*)   ? Hemoglobin 7.1 (*)   ? All other components within normal limits  ?TROPONIN I (HIGH SENSITIVITY) - Abnormal; Notable for the following components:  ? Troponin I (High Sensitivity) 41 (*)   ? All other components within normal limits  ?TROPONIN I (HIGH SENSITIVITY) - Abnormal; Notable for the following components:  ? Troponin I (High Sensitivity) 60 (*)   ? All other components within normal limits  ?CULTURE, BLOOD (ROUTINE X 2)  ?CULTURE, BLOOD (ROUTINE X 2)  ?LACTIC ACID, PLASMA  ?LACTIC ACID, PLASMA  ?AMMONIA  ?URINALYSIS, ROUTINE W REFLEX MICROSCOPIC  ?BLOOD GAS, ARTERIAL  ?OCCULT BLOOD X 1 CARD TO LAB, STOOL  ?TSH  ?TYPE AND SCREEN  ? ? ?EKG ?EKG Interpretation ? ?Date/Time:  Saturday July 09 2021 10:04:50 EDT ?Ventricular Rate:  111 ?PR Interval:  138 ?QRS Duration: 82 ?QT Interval:  363 ?QTC Calculation: 494 ?R Axis:   60 ?Text Interpretation: Sinus tachycardia Low voltage, extremity leads Borderline repolarization abnormality Borderline prolonged QT interval when compared to prior, similar appearance. No STEMI Confirmed by Antony Blackbird [phone]) on 07/09/2021 10:20:34 AM ? ?Radiology ?CT Angio Chest PE W and/or Wo Contrast ? ?Result Date: 07/09/2021 ?CLINICAL DATA:  Positive D-dimer.  Pulmonary embolism suspected. EXAM: CT ANGIOGRAPHY CHEST WITH CONTRAST TECHNIQUE: Multidetector CT imaging of the chest was performed using the standard protocol during bolus administration of intravenous contrast. Multiplanar CT image reconstructions and MIPs were obtained  to evaluate the vascular anatomy. RADIATION DOSE REDUCTION: This exam was performed according to the departmental dose-optimization program which includes automated exposure control, adjustment of the mA and/o

## 2021-07-09 NOTE — Assessment & Plan Note (Addendum)
Calcium 7.5 on admission mildly low ionized calcium of 1.1.  ?- Continue Tums ?
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
## 2021-07-10 ENCOUNTER — Inpatient Hospital Stay (HOSPITAL_COMMUNITY): Payer: Medicare Other

## 2021-07-10 ENCOUNTER — Encounter (HOSPITAL_COMMUNITY): Payer: Self-pay | Admitting: Internal Medicine

## 2021-07-10 DIAGNOSIS — J90 Pleural effusion, not elsewhere classified: Secondary | ICD-10-CM

## 2021-07-10 DIAGNOSIS — I2699 Other pulmonary embolism without acute cor pulmonale: Secondary | ICD-10-CM | POA: Diagnosis not present

## 2021-07-10 DIAGNOSIS — J9601 Acute respiratory failure with hypoxia: Secondary | ICD-10-CM | POA: Diagnosis not present

## 2021-07-10 LAB — BASIC METABOLIC PANEL
Anion gap: 8 (ref 5–15)
BUN: 21 mg/dL (ref 8–23)
CO2: 26 mmol/L (ref 22–32)
Calcium: 7.7 mg/dL — ABNORMAL LOW (ref 8.9–10.3)
Chloride: 107 mmol/L (ref 98–111)
Creatinine, Ser: 1.87 mg/dL — ABNORMAL HIGH (ref 0.61–1.24)
GFR, Estimated: 40 mL/min — ABNORMAL LOW (ref >60)
Glucose, Bld: 78 mg/dL (ref 70–99)
Potassium: 3.7 mmol/L (ref 3.5–5.1)
Sodium: 141 mmol/L (ref 135–145)

## 2021-07-10 LAB — CBC
HCT: 25.2 % — ABNORMAL LOW (ref 39.0–52.0)
Hemoglobin: 7.7 g/dL — ABNORMAL LOW (ref 13.0–17.0)
MCH: 29.2 pg (ref 26.0–34.0)
MCHC: 30.6 g/dL (ref 30.0–36.0)
MCV: 95.5 fL (ref 80.0–100.0)
Platelets: 161 10*3/uL (ref 150–400)
RBC: 2.64 MIL/uL — ABNORMAL LOW (ref 4.22–5.81)
RDW: 19 % — ABNORMAL HIGH (ref 11.5–15.5)
WBC: 6.6 10*3/uL (ref 4.0–10.5)
nRBC: 0 % (ref 0.0–0.2)

## 2021-07-10 LAB — BPAM RBC
Blood Product Expiration Date: 202303222359
ISSUE DATE / TIME: 202303182005
Unit Type and Rh: 6200

## 2021-07-10 LAB — URINALYSIS, ROUTINE W REFLEX MICROSCOPIC
Bacteria, UA: NONE SEEN
Bilirubin Urine: NEGATIVE
Glucose, UA: 150 mg/dL — AB
Hgb urine dipstick: NEGATIVE
Ketones, ur: NEGATIVE mg/dL
Leukocytes,Ua: NEGATIVE
Nitrite: NEGATIVE
Protein, ur: >=300 mg/dL — AB
Specific Gravity, Urine: 1.019 (ref 1.005–1.030)
pH: 8 (ref 5.0–8.0)

## 2021-07-10 LAB — HEPATIC FUNCTION PANEL
ALT: 23 U/L (ref 0–44)
AST: 35 U/L (ref 15–41)
Albumin: 2.1 g/dL — ABNORMAL LOW (ref 3.5–5.0)
Alkaline Phosphatase: 85 U/L (ref 38–126)
Bilirubin, Direct: <0.1 mg/dL (ref 0.0–0.2)
Total Bilirubin: 0.5 mg/dL (ref 0.3–1.2)
Total Protein: 5.6 g/dL — ABNORMAL LOW (ref 6.5–8.1)

## 2021-07-10 LAB — TYPE AND SCREEN
ABO/RH(D): A POS
Antibody Screen: NEGATIVE
Unit division: 0

## 2021-07-10 LAB — GLUCOSE, CAPILLARY
Glucose-Capillary: 129 mg/dL — ABNORMAL HIGH (ref 70–99)
Glucose-Capillary: 109 mg/dL — ABNORMAL HIGH (ref 70–99)

## 2021-07-10 LAB — TROPONIN I (HIGH SENSITIVITY): Troponin I (High Sensitivity): 57 ng/L — ABNORMAL HIGH (ref <18)

## 2021-07-10 LAB — LACTATE DEHYDROGENASE: LDH: 168 U/L (ref 98–192)

## 2021-07-10 LAB — HEPARIN LEVEL (UNFRACTIONATED)
Heparin Unfractionated: <0.1 IU/mL — ABNORMAL LOW (ref 0.30–0.70)
Heparin Unfractionated: 0.2 IU/mL — ABNORMAL LOW (ref 0.30–0.70)
Heparin Unfractionated: 0.32 IU/mL (ref 0.30–0.70)

## 2021-07-10 LAB — MRSA NEXT GEN BY PCR, NASAL: MRSA by PCR Next Gen: NOT DETECTED

## 2021-07-10 IMAGING — DX DG CHEST 1V PORT
1 series · 1 of 1 positions shown · non-contrast
Comparison: [DATE]

CLINICAL DATA: Wheezing.  Decreased right breath sounds.

EXAM:
PORTABLE CHEST 1 VIEW

[chest]
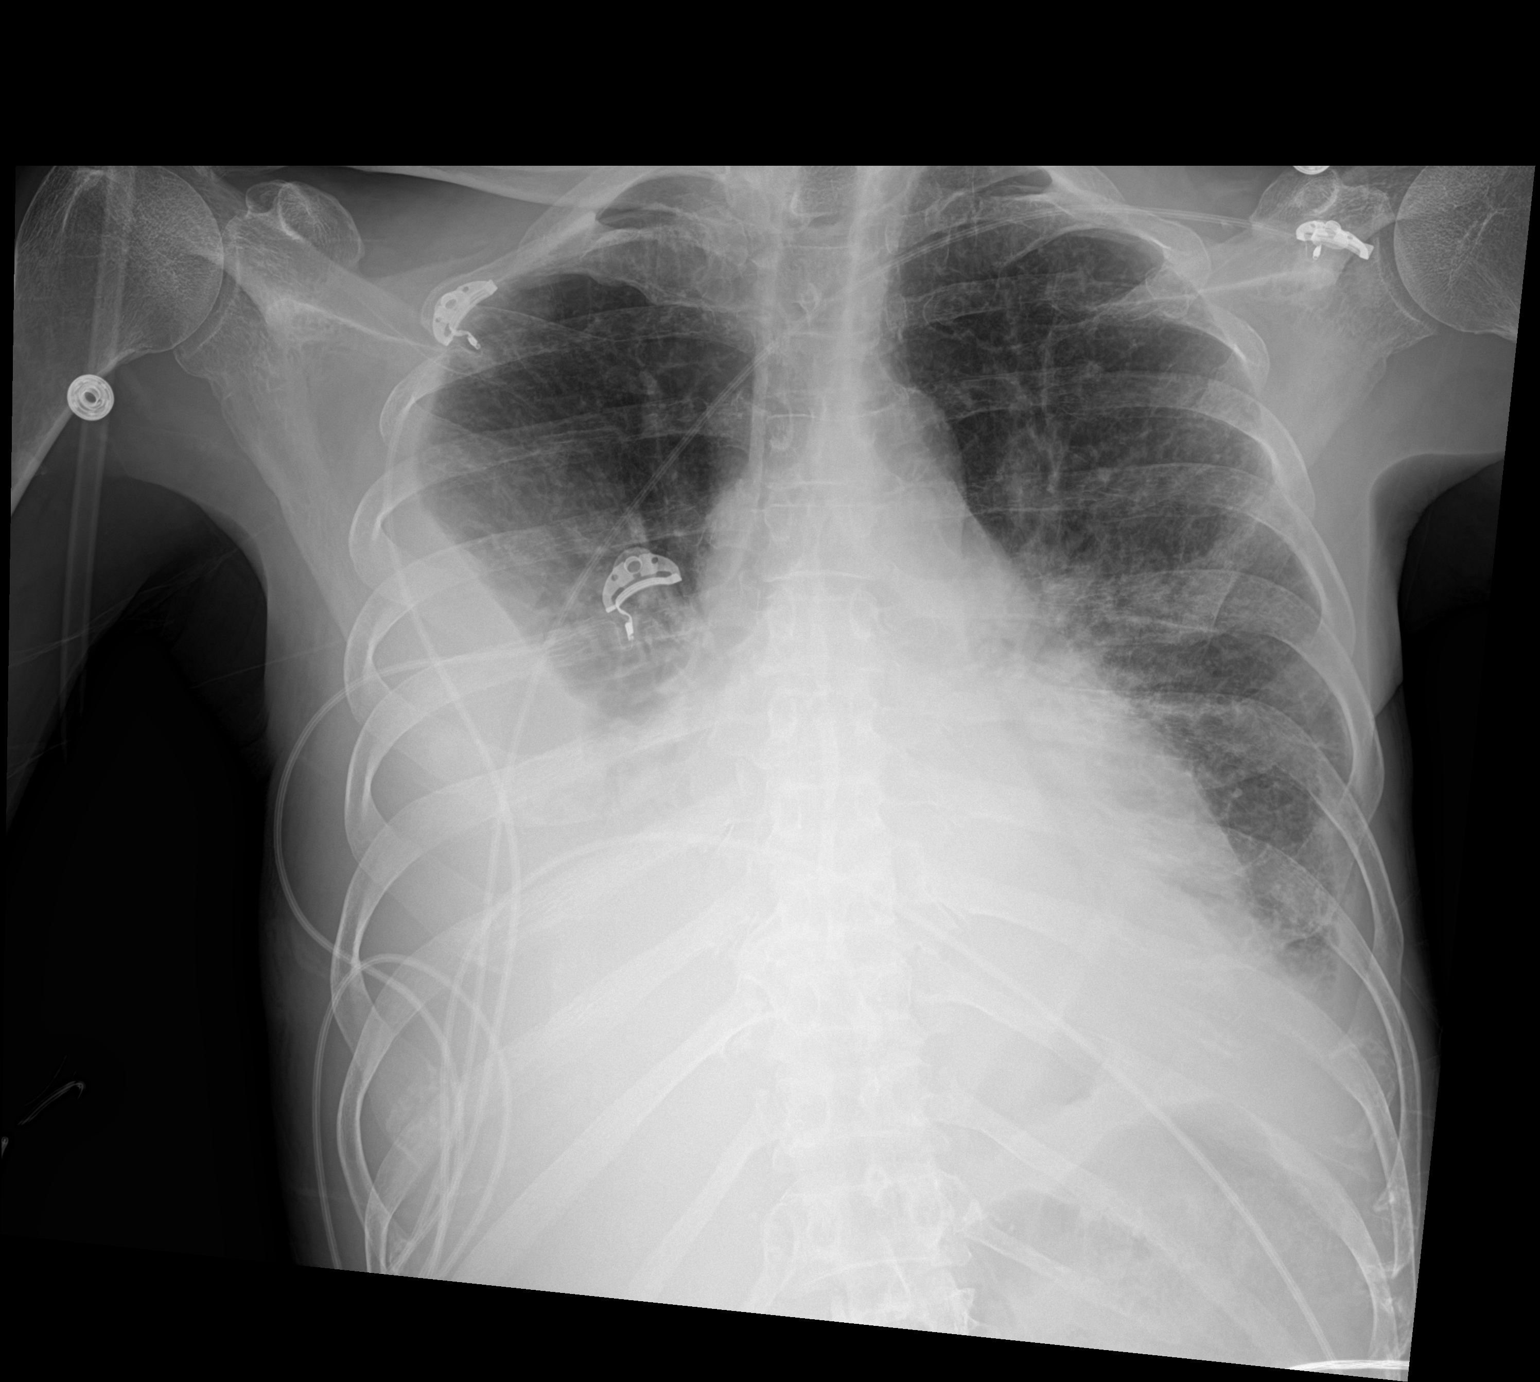

[1 of 1 positions shown; findings below may reference images not displayed]

FINDINGS: Stable cardiomediastinal contours. Moderate to large right pleural
effusion is unchanged. Left scratch set smaller left pleural
effusion is also stable. Mild interstitial edema. Decreased aeration
to both lung bases appears similar to previous study.
IMPRESSION: 1. No change in bilateral pleural effusions, right greater than
left, and mild interstitial edema.
2. Decreased aeration to both lung bases.

## 2021-07-10 IMAGING — DX DG CHEST 1V PORT
1 series · 1 of 1 positions shown · non-contrast
Comparison: Chest radiograph earlier today.

CLINICAL DATA: Chest tube in place.

EXAM:
PORTABLE CHEST 1 VIEW

[chest ap]
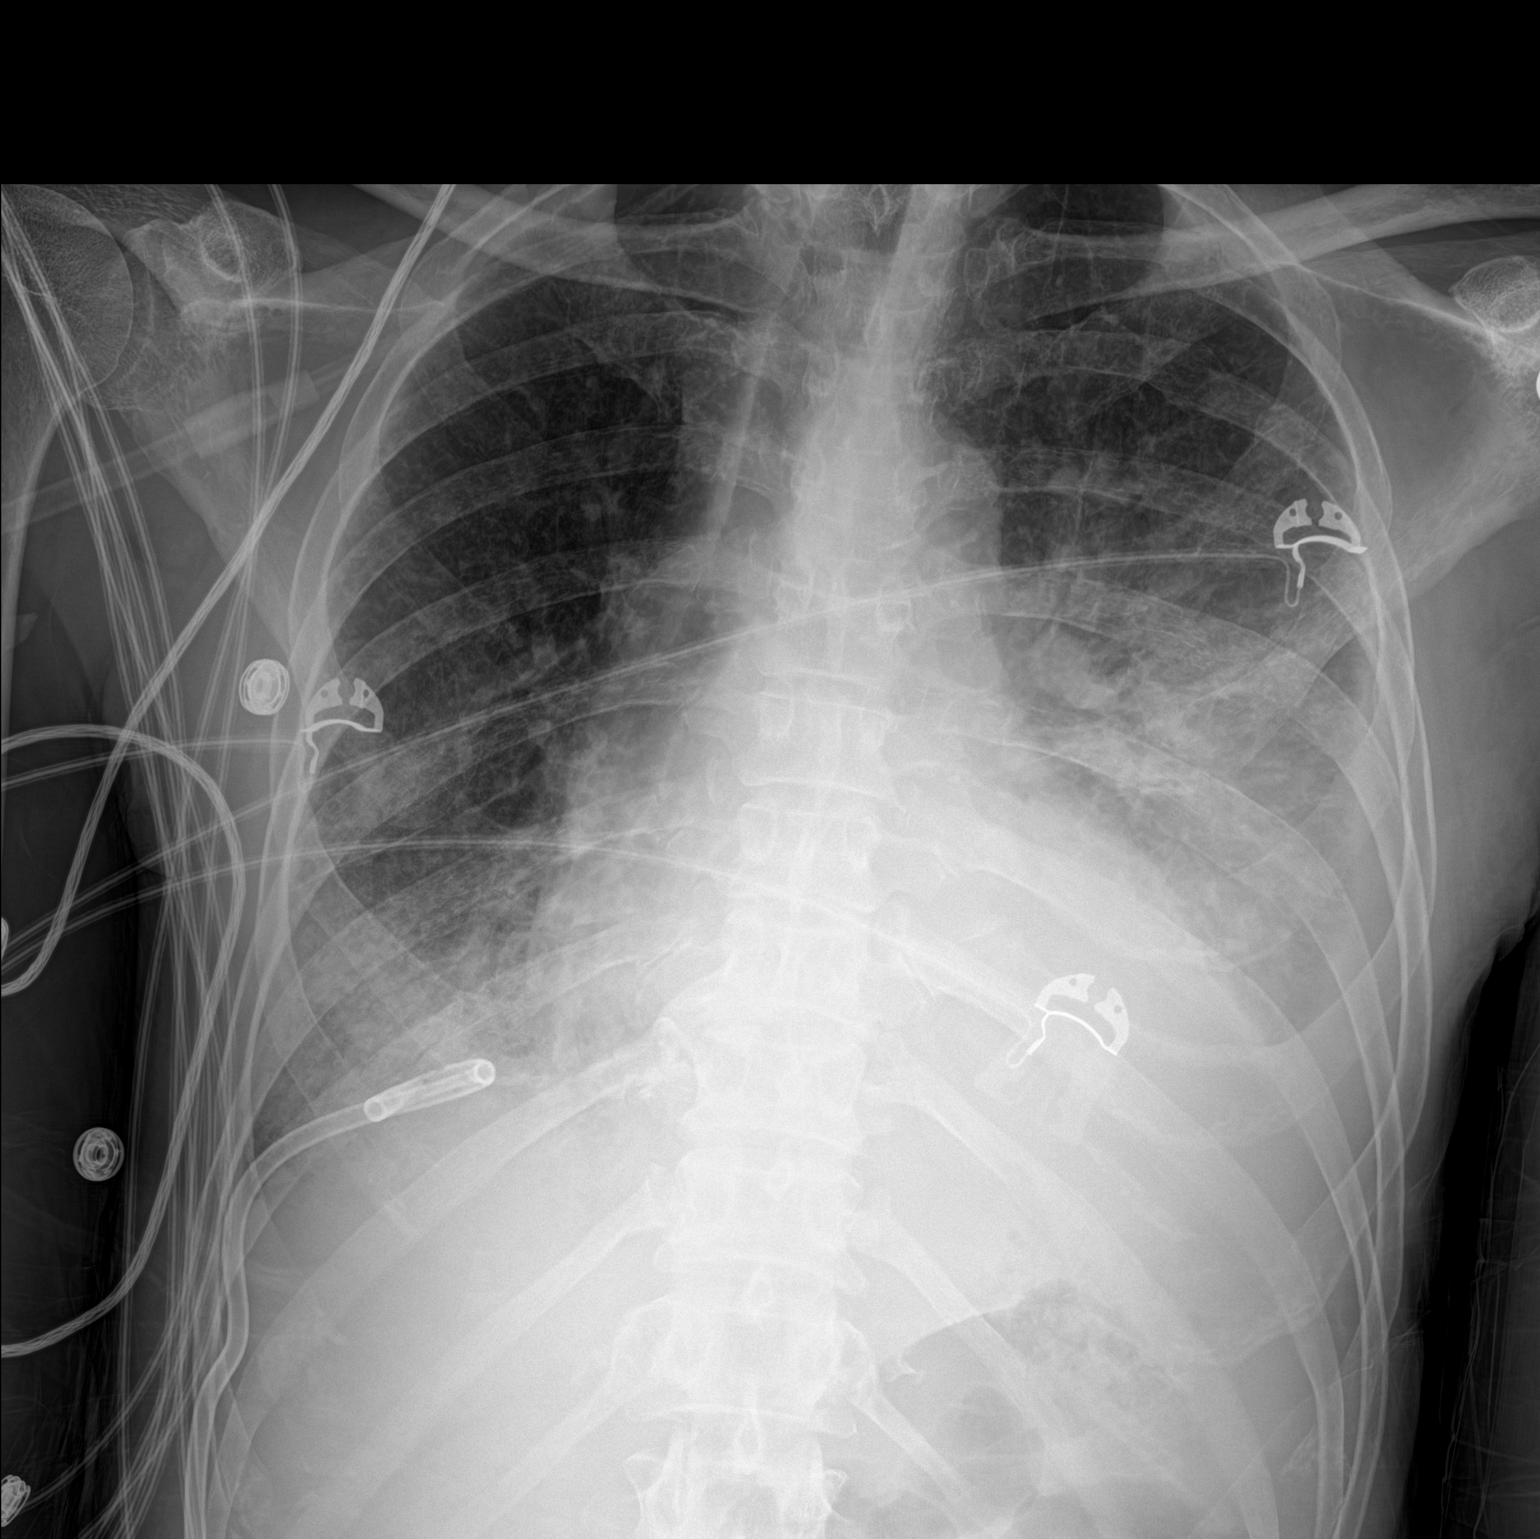

[1 of 1 positions shown; findings below may reference images not displayed]

FINDINGS: Placement of pigtail catheter which projects over the right lung
base. Significant improvement in right pleural effusion from earlier
today with small residual. No pneumothorax. There is patchy opacity
in the right mid lower lung zone, likely atelectasis. Moderate size
left pleural effusion is equivocally increased from earlier today.
The heart appears to be enlarged. Stable mediastinal contours.
IMPRESSION: 1. Placement of right basilar pigtail catheter with significant
improvement in right pleural effusion from earlier today with small
residual. Patchy opacity in the right mid lower lung zone, likely
atelectasis. No pneumothorax.
2. Moderate left pleural effusion, equivocally increased from
earlier today.

## 2021-07-10 MED ORDER — IPRATROPIUM-ALBUTEROL 0.5-2.5 (3) MG/3ML IN SOLN
RESPIRATORY_TRACT | Status: AC
  Filled 2021-07-10: qty 3

## 2021-07-10 MED ORDER — IPRATROPIUM-ALBUTEROL 0.5-2.5 (3) MG/3ML IN SOLN
3.0000 mL | Freq: Four times a day (QID) | RESPIRATORY_TRACT | Status: DC
Start: 2021-07-10 — End: 2021-07-10
  Administered 2021-07-10 (×2): 3 mL via RESPIRATORY_TRACT
  Filled 2021-07-10: qty 3

## 2021-07-10 MED ORDER — ALBUTEROL SULFATE (2.5 MG/3ML) 0.083% IN NEBU: 2.5000 mg | INHALATION_SOLUTION | Freq: Once | RESPIRATORY_TRACT | Status: DC | PRN

## 2021-07-10 MED ORDER — CHLORHEXIDINE GLUCONATE CLOTH 2 % EX PADS
6.0000 | MEDICATED_PAD | Freq: Every day | CUTANEOUS | Status: DC
  Administered 2021-07-10 – 2021-07-13 (×4): 6 via TOPICAL

## 2021-07-10 MED ORDER — FENTANYL CITRATE (PF) 100 MCG/2ML IJ SOLN
12.5000 ug | Freq: Once | INTRAMUSCULAR | Status: AC
  Administered 2021-07-10: 12.5 ug via INTRAVENOUS
  Filled 2021-07-10: qty 2

## 2021-07-10 MED ORDER — UMECLIDINIUM-VILANTEROL 62.5-25 MCG/ACT IN AEPB
1.0000 | INHALATION_SPRAY | Freq: Every day | RESPIRATORY_TRACT | Status: DC
  Filled 2021-07-10: qty 14

## 2021-07-10 MED ORDER — HYDROCODONE-ACETAMINOPHEN 5-325 MG PO TABS
1.0000 | ORAL_TABLET | Freq: Four times a day (QID) | ORAL | Status: DC | PRN
Start: 2021-07-10 — End: 2021-07-13
  Administered 2021-07-10 – 2021-07-12 (×3): 1 via ORAL
  Filled 2021-07-10 (×3): qty 1

## 2021-07-10 MED ORDER — SODIUM CHLORIDE 0.9% FLUSH
10.0000 mL | Freq: Three times a day (TID) | INTRAVENOUS | Status: DC
  Administered 2021-07-10 – 2021-07-13 (×6): 10 mL

## 2021-07-10 MED ORDER — MAGNESIUM SULFATE 4 GM/100ML IV SOLN
4.0000 g | Freq: Once | INTRAVENOUS | Status: AC
  Administered 2021-07-10: 4 g via INTRAVENOUS
  Filled 2021-07-10: qty 100

## 2021-07-10 MED ORDER — IPRATROPIUM-ALBUTEROL 0.5-2.5 (3) MG/3ML IN SOLN: 3.0000 mL | Freq: Four times a day (QID) | RESPIRATORY_TRACT | Status: DC | PRN

## 2021-07-10 MED ORDER — UMECLIDINIUM-VILANTEROL 62.5-25 MCG/ACT IN AEPB
1.0000 | INHALATION_SPRAY | Freq: Every day | RESPIRATORY_TRACT | Status: DC
  Administered 2021-07-11: 1 via RESPIRATORY_TRACT

## 2021-07-10 NOTE — Significant Event (Signed)
Rapid Response Event Note  ? ?Reason for Call :  ?Respiratory distress ? ?Initial Focused Assessment:  ?Pt sitting up in bed in resp distress, +WOB, +accessory muscle use. Pt denies chest pain. Lungs wheezy t/o. Skin hot and diaphoretic. ? ?HR-98, BP-144/107, RR-40, SpO2-96% on 6L [place] ? ?Interventions:  ?Done PTA RRT: Alb tx, 0800 lasix dose given ? ?EKG-ST ?PCXR ?Bipap ?Plan of Care:  ?Pt breathing is much better on bipap. Await PCXR results. Continue to monitor pt closely. Call RRT if further assistance needed.  ? ?Event Summary:  ? ?MD Notified: Marlowe Sax notified by bedside RN ?Call [id number] ?Arrival [id number] ?End Time:0700 ? ?Dillard Essex, RN ?

## 2021-07-10 NOTE — Progress Notes (Signed)
ANTICOAGULATION CONSULT NOTE - Follow Up Consult ? ?Pharmacy Consult for heparin ?Indication: pulmonary embolus ? ?Labs: ?Recent Labs  ?  07/09/21 ?1011 07/09/21 ?1046 07/09/21 ?1225 07/09/21 ?1305 07/10/21 ?0143  ?HGB 7.4*  --  7.1*  --   --   ?HCT 25.6*  --  21.0*  --   --   ?PLT 187  --   --   --   --   ?HEPARINUNFRC  --   --   --   --  <0.10*  ?CREATININE 1.83*  --   --   --  1.87*  ?TROPONINIHS  --  41*  --  60*  --   ? ? ?Assessment: ?61yo male subtherapeutic on heparin with initial dosing for PE; no infusion issues or signs of bleeding per RN. ? ?Goal of Therapy:  ?Heparin level 0.3-0.7 units/ml ?  ?Plan:  ?Will increase heparin infusion by 4 units/kg/hr to 950 units/hr and check level in 8 hours.   ? ?Wynona Neat, PharmD, BCPS  ?07/10/2021,2:35 AM ? ? ?

## 2021-07-10 NOTE — Progress Notes (Signed)
Lower extremity venous duplex has been completed.  ? ?Preliminary results in CV Proc.  ? ?Searra Carnathan Angelus Hoopes ?07/10/2021 12:14 PM    ?

## 2021-07-10 NOTE — Procedures (Signed)
Insertion of Chest Tube Procedure Note ? ?[name]  ?[id number]  ?09/22/1960 ? ?Date:07/10/21  ?Time:11:34 AM  ? ? ?Provider Performing: Bonna Gains Jonathyn Carothers  ? ?Procedure: Pleural Catheter Insertion w/ Imaging Guidance (00525) ? ?Indication(s) ?Effusion ? ?Consent ?Risks of the procedure as well as the alternatives and risks of each were explained to the patient and/or caregiver.  Consent for the procedure was obtained and is signed in the bedside chart ? ?Anesthesia ?Topical only with 1% lidocaine  ? ? ?Time Out ?Verified patient identification, verified procedure, site/side was marked, verified correct patient position, special equipment/implants available, medications/allergies/relevant history reviewed, required imaging and test results available. ? ? ?Sterile Technique ?Maximal sterile technique including full sterile barrier drape, hand hygiene, sterile gown, sterile gloves, mask, hair covering, sterile ultrasound probe cover (if used). ? ? ?Procedure Description ?Ultrasound used to identify appropriate pleural anatomy for placement and overlying skin marked. Simply, hypoechoic fluid present. Area of placement cleaned and draped in sterile fashion.  A 14 French pigtail pleural catheter was placed into the right pleural space using Seldinger technique. Appropriate return of clear serous fluid fluid was obtained.  The tube was connected to atrium and placed on -20 cm H2O wall suction. Appropriate tidaling was observed.  ? ? ?Complications/Tolerance ?None; patient tolerated the procedure well. ?Chest X-ray is ordered to verify placement. ? ? ?EBL ?Minimal ? ?Specimen(s) ?fluid  ?

## 2021-07-10 NOTE — Progress Notes (Signed)
ANTICOAGULATION CONSULT NOTE - Initial Consult ? ?Pharmacy Consult for heparin ?Indication: pulmonary embolus ? ?Allergies  ?Allergen Reactions  ? Dilantin [Phenytoin]   ?  Made him confused, manic  ? Oxycodone Other (See Comments)  ?  Hallucinations  ? ? ?Patient Measurements: ?Height: _0  (162.6 cm) ?Weight: 47 kg (103 lb 9.9 oz) ?IBW/kg (Calculated) : 59.2 ?Heparin Dosing Weight: TBW ? ?Vital Signs: ?Temp: 97.5 ?F (36.4 ?C) (03/19 1200) ?Temp Source: Oral (03/19 1200) ?BP: 145/88 (03/19 1300) ?Pulse Rate: 90 (03/19 1300) ? ?Labs: ?Recent Labs  ?  07/09/21 ?1011 07/09/21 ?1046 07/09/21 ?1225 07/09/21 ?1305 07/10/21 ?0143 07/10/21 ?0217 07/10/21 ?0710 07/10/21 ?1307  ?HGB 7.4*  --  7.1*  --   --  7.7*  --   --   ?HCT 25.6*  --  21.0*  --   --  25.2*  --   --   ?PLT 187  --   --   --   --  161  --   --   ?HEPARINUNFRC  --   --   --   --  <0.10*  --   --  0.20*  ?CREATININE 1.83*  --   --   --  1.87*  --   --   --   ?TROPONINIHS  --  41*  --  60*  --   --  57*  --   ? ? ? ?Estimated Creatinine Clearance: 27.2 mL/min (A) (by C-G formula based on SCr of 1.87 mg/dL (H)). ? ? ?Medical History: ?Past Medical History:  ?Diagnosis Date  ? (HFpEF) heart failure with preserved ejection fraction ([place])   ? Anxiety   ? CAD (coronary artery disease)   ? CKD (chronic kidney disease), stage IV ([place])   ? Gout   ? Hyperlipidemia   ? Hypertension 05/14/2012  ?  Lexiscan-- EF 51% ,LV normal  ? Hypothyroid   ? MI (myocardial infarction) ([place]) 2010  ? Pericardial effusion 12/2008  ? Dr [name] performed a subxiphoid window removing 228m of fluid  ? Pericardial effusion 03/09/2010  ? Echo-LVEF >55%, very small pericardial effusion ,,Stage 1 (impaired ) diastolic fxn, elevated LV filling  ? Pericarditis   ? Raynaud's phenomenon   ? Scleroderma ([place]   ? Seizures ([place]   ? Smoker 09/16/2018  ? 1 ppd  ? Vitiligo   ? ? ?Medications:  ?Medications Prior to Admission  ?Medication Sig Dispense Refill Last Dose  ? allopurinol (ZYLOPRIM)  100 MG tablet TAKE 1 TABLET BY MOUTH EVERY DAY (Patient taking differently: Take 100 mg by mouth daily.) 30 tablet 2 07/08/2021  ? calcium carbonate (TUMS - DOSED IN MG ELEMENTAL CALCIUM) 500 MG chewable tablet Chew 1 tablet by mouth 2 (two) times daily with a meal. 20 tablet 0 07/08/2021  ? Epoetin Alfa-epbx (RETACRIT IJ) Inject as directed every 30 (thirty) days.     ? feeding supplement (ENSURE ENLIVE / ENSURE PLUS) LIQD Take 237 mLs by mouth 3 (three) times daily between meals. 237 mL 12 UNK  ? Lacosamide 100 MG TABS Take 1 tablet twice a day (Patient taking differently: Take 100 mg by mouth 2 (two) times daily. Take 1 tablet twice a day) 60 tablet 5 07/08/2021  ? macitentan (OPSUMIT) 10 MG tablet Take 1 tablet (10 mg total) by mouth daily. 90 tablet 3 07/08/2021  ? multivitamin-iron-minerals-folic acid (CENTRUM) chewable tablet Chew 1 tablet by mouth daily.   07/08/2021  ? pantoprazole (PROTONIX) 40 MG tablet Take 1 tablet (40 mg  total) by mouth daily for 90 doses. 90 tablet 3 07/08/2021  ? Potassium Chloride ER 20 MEQ TBCR Take 1 tablet by mouth daily.   07/08/2021  ? QUEtiapine (SEROQUEL) 25 MG tablet Take 1 tablet (25 mg total) by mouth at bedtime. 30 tablet 1 Past Week  ? rosuvastatin (CRESTOR) 20 MG tablet Take 20 mg by mouth daily.   07/08/2021  ? sildenafil (REVATIO) 20 MG tablet Take 2 tablets (40 mg total) by mouth 3 (three) times daily. (Patient taking differently: Take 40 mg by mouth in the morning and at bedtime.) 180 tablet 11 07/08/2021  ? sodium bicarbonate 650 MG tablet Take 2 tablets (1,300 mg total) by mouth 3 (three) times daily. 30 tablet 0 07/08/2021  ? torsemide (DEMADEX) 20 MG tablet Take 20 mg by mouth daily.   07/08/2021  ? ? ?Assessment: ?38 yom who presents to the ED with SOB and chest pressure. He has a hx PAH. CTA chest reveals acute PE with possible punctate punctate thrombus within the proximal right apical segmental pulmonary artery. He does not take AC PTA. Pharmacy consulted to begin IV  heparin. ? ?Heparin level 0.2 units/hr (on 950 units/hr) ?Chest tube placed for pleural effusion, FOBT + ? ?Goal of Therapy:  ?Heparin level 0.3-0.7 units/ml ?Monitor platelets by anticoagulation protocol: Yes ?  ?Plan:  ?- Will cautiously increase heparin infusion to 10500 units/hr - no bolus ?- 8 hr heparin level ?- Daily heparin level and CBC ?- Monitor for signs/symptoms of bleeding ? ?Thank you for allowing pharmacy to be a part of this patient?s care. ? ?Donnald Garre, PharmD ?Clinical Pharmacist ? ?Please check AMION for all [place] numbers ?After 10:00 PM, call [place] [phone] ? ? ? ? ?

## 2021-07-10 NOTE — Progress Notes (Signed)
PT Cancellation Note ? ?Patient Details ?Name: [name] ?MRN: [id number] ?DOB: July 05, 1960 ? ? ?Cancelled Treatment:    Reason Eval/Treat Not Completed: Medical issues which prohibited therapy this morning as pt with increased work of breathing requiring placement on Bipap and transfer to ICU with chest tube placement. Will continue to follow and evaluate as appropriate.  ? ?West Carbo, PT, DPT  ? ?Acute Rehabilitation Department ?Pager #: [phone] - [phone] ? ? ?[name] ?07/10/2021, 8:05 AM ?

## 2021-07-10 NOTE — Progress Notes (Signed)
Overnight event ? ?Notified by RN that the patient woke up this morning with acute onset shortness of breath and respiratory distress.  He has significantly increased work of breathing with accessory muscle use.  Wheezing on exam and was given an albuterol treatment.  He was also given his scheduled IV Lasix 40 mg and placed on BiPAP after which work of breathing improved significantly. ? ?Chart reviewed, patient admitted yesterday for acute PE, large right and moderate left pleural effusions.  BNP elevated at 971.  Last echo done in February 2023 showing EF 40 to 45%, global hypokinesis, and grade 2 diastolic dysfunction.  He was placed on heparin drip and started on IV Lasix for diuresis.  He was given 1 unit PRBCs for worsening anemia with hemoglobin 7.1. ? ?Patient seen and examined at bedside.  He reports improvement of his dyspnea after being placed on BiPAP.  Denies chest pain.  Satting 100% on BiPAP and appears comfortable.  Heart rate in the 120s.  Hypertensive with systolic in the 191Y.  Auscultation of lungs revealed mild wheezing and diminished breath sounds at the bases bilaterally.  He has no peripheral edema. ? ?-Stat EKG done and showing no STEMI.  Stat troponin ordered and currently pending. ?-Stat chest x-ray ordered and currently pending. ?-Continue BiPAP ?-Patient has already received IV Lasix 40 mg this morning, continue diuresis and monitor renal function.   ?-He will need thoracentesis for pleural effusions, please consult IR in the morning. ?-Continue albuterol neb as needed ?-Continue heparin drip for PE ?-PCCM consulted ?
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
## 2021-07-10 NOTE — Progress Notes (Signed)
ANTICOAGULATION CONSULT NOTE ? ?Pharmacy Consult for heparin ?Indication: pulmonary embolus ? ?Allergies  ?Allergen Reactions  ? Dilantin [Phenytoin]   ?  Made him confused, manic  ? Oxycodone Other (See Comments)  ?  Hallucinations  ? ? ?Patient Measurements: ?Height: 5' 4" (162.6 cm) ?Weight: 47 kg (103 lb 9.9 oz) ?IBW/kg (Calculated) : 59.2 ?Heparin Dosing Weight: TBW ? ?Vital Signs: ?Temp: 97.9 ?F (36.6 ?C) (03/19 2000) ?Temp Source: Oral (03/19 2000) ?BP: 144/84 (03/19 2000) ?Pulse Rate: 82 (03/19 2000) ? ?Labs: ?Recent Labs  ?  07/09/21 ?1011 07/09/21 ?1046 07/09/21 ?1225 07/09/21 ?1305 07/10/21 ?0143 07/10/21 ?0217 07/10/21 ?0710 07/10/21 ?1307 07/10/21 ?2120  ?HGB 7.4*  --  7.1*  --   --  7.7*  --   --   --   ?HCT 25.6*  --  21.0*  --   --  25.2*  --   --   --   ?PLT 187  --   --   --   --  161  --   --   --   ?HEPARINUNFRC  --   --   --   --  <0.10*  --   --  0.20* 0.32  ?CREATININE 1.83*  --   --   --  1.87*  --   --   --   --   ?TROPONINIHS  --  41*  --  60*  --   --  57*  --   --   ? ? ? ?Estimated Creatinine Clearance: 27.2 mL/min (A) (by C-G formula based on SCr of 1.87 mg/dL (H)). ? ?Assessment: ?59 yom who presents to the ED with SOB and chest pressure. He has a hx PAH. CTA chest reveals acute PE with possible punctate punctate thrombus within the proximal right apical segmental pulmonary artery. He does not take AC PTA. Pharmacy consulted to begin IV heparin. ? ?Heparin level is therapeutic at 0.32 on 1050 units/hr. No bleeding noted. ?Chest tube placed for pleural effusion, FOBT +. ? ?Goal of Therapy:  ?Heparin level 0.3-0.7 units/ml ?Monitor platelets by anticoagulation protocol: Yes ?  ?Plan:  ?- Continue heparin infusion at 1050 units/hr - no bolus ?- Daily heparin level and CBC ?- Monitor for signs/symptoms of bleeding ? ?Thank you for involving pharmacy in this patient's care. ? ?Renold Genta, PharmD, BCPS ?Clinical Pharmacist ?Clinical phone for 07/10/2021 until 10p is [phone] ?07/10/2021  10:04 PM ? ?**Pharmacist phone directory can be found on [email or web address] listed under [place]** ? ? ? ? ?

## 2021-07-10 NOTE — Progress Notes (Addendum)
RN called to patient's room due to patient SOB. ? ?Patient was in Resp Distress, with increased WOB, diaphoretic, no chest pain SpO2 86% ? ?[place] Oxygen increased from 2L to 6L,  ? ?RN gave PRN Alb treatment, 0800 scheduled does of lasix. ? ?RN paged provider on call & RR RN. ? ?Decision was decided to placed patient on Bipap - patient breathing improved.   ? ?Provider on call ordered EKG stat & tropin  ? ? ?

## 2021-07-10 NOTE — Plan of Care (Signed)
?  Problem: Clinical Measurements: ?Goal: Ability to maintain clinical measurements within normal limits will improve ?Outcome: Progressing ?Goal: Respiratory complications will improve ?Outcome: Progressing ?Goal: Cardiovascular complication will be avoided ?Outcome: Progressing ?  ?Problem: Coping: ?Goal: Level of anxiety will decrease ?Outcome: Progressing ?  ?Problem: Pain Managment: ?Goal: General experience of comfort will improve ?Outcome: Progressing ?  ?Problem: Skin Integrity: ?Goal: Risk for impaired skin integrity will decrease ?Outcome: Not Progressing ?  ?

## 2021-07-10 NOTE — Progress Notes (Signed)
? ?Progress Note ? ?Patient Name: [name] ?Date of Encounter: 07/10/2021 ? ?[facility] Cardiologist: [name], MD  ? ?Subjective  ? ?[name] had acute respiratory distress overnight with increased wheezing. Was eventually placed on BIPAP. Pulmonology was engaged. Plan for possible R sided chest tube/lytics. Crt stable ? ?Inpatient Medications  ?  ?Scheduled Meds: ? calcium carbonate  1 tablet Oral BID WC  ? Chlorhexidine Gluconate Cloth  6 each Topical Daily  ? fidaxomicin  200 mg Oral BID  ? furosemide  40 mg Intravenous BID  ? ipratropium-albuterol  3 mL Nebulization Q6H  ? lacosamide  100 mg Oral BID  ? macitentan  10 mg Oral Daily  ? pantoprazole  40 mg Oral Daily  ? QUEtiapine  25 mg Oral QHS  ? rosuvastatin  20 mg Oral Daily  ? sildenafil  40 mg Oral TID  ? sodium bicarbonate  1,300 mg Oral TID  ? sodium chloride flush  3 mL Intravenous Q12H  ? torsemide  20 mg Oral Daily  ? ?Continuous Infusions: ? heparin 950 Units/hr (07/10/21 0330)  ? ?PRN Meds: ?acetaminophen **OR** acetaminophen, albuterol, albuterol, hydrALAZINE, nicotine, ondansetron **OR** ondansetron (ZOFRAN) IV  ? ?Vital Signs  ?  ?Vitals:  ? 07/10/21 0606 07/10/21 0650 07/10/21 0718 07/10/21 0810  ?BP: (!) 144/107     ?Pulse: 98   99  ?Resp:    (!) 21  ?Temp:      ?TempSrc:      ?SpO2: 100% 98% 100% 100%  ?Weight:      ?Height:      ? ? ?Intake/Output Summary (Last 24 hours) at 07/10/2021 1028 ?Last data filed at 07/10/2021 0330 ?Gross per 24 hour  ?Intake 770 ml  ?Output 320 ml  ?Net 450 ml  ? ?Last 3 Weights 07/10/2021 07/09/2021 06/21/2021  ?Weight (lbs) 103 lb 9.9 oz 103 lb 9.9 oz 103 lb 8 oz  ?Weight (kg) 47 kg 47 kg 46.947 kg  ?   ? ?Telemetry  ?  ?NSR - Personally Reviewed ? ?ECG  ?  ?Sinus tachycardia - Personally Reviewed ? ?Physical Exam  ? ?Vitals:  ? 07/10/21 0718 07/10/21 0810  ?BP:    ?Pulse:  99  ?Resp:  (!) 21  ?Temp:    ?SpO2: 100% 100%  ? ? ?GEN: No acute distress.   ?Neck: No JVD ?Cardiac: RRR, no murmurs,  rubs, or gallops.  ?Respiratory: Clear to auscultation bilaterally. ?GI: Soft, nontender, non-distended  ?MS: No edema; No deformity. ?Neuro:  Nonfocal  ?Psych: Normal affect  ? ?Labs  ?  ?High Sensitivity Troponin:   ?Recent Labs  ?Lab 07/09/21 ?1046 07/09/21 ?1305 07/10/21 ?0710  ?TROPONINIHS 41* 60* 57*  ?   ?Chemistry ?Recent Labs  ?Lab 07/09/21 ?1011 07/09/21 ?1225 07/09/21 ?1619 07/10/21 ?0143  ?NA 143 144  --  141  ?K 3.2* 3.1*  --  3.7  ?CL 108  --   --  107  ?CO2 27  --   --  26  ?GLUCOSE 91  --   --  78  ?BUN 22  --   --  21  ?CREATININE 1.83*  --   --  1.87*  ?CALCIUM 7.5*  --   --  7.7*  ?MG  --   --  1.3*  --   ?PROT 5.7*  --   --   --   ?ALBUMIN 2.1*  --   --   --   ?AST 39  --   --   --   ?  ALT 30  --   --   --   ?ALKPHOS 94  --   --   --   ?BILITOT 0.4  --   --   --   ?GFRNONAA 41*  --   --  40*  ?ANIONGAP 8  --   --  8  ?  ?Lipids No results for input(s): CHOL, TRIG, HDL, LABVLDL, LDLCALC, CHOLHDL in the last 168 hours.  ?Hematology ?Recent Labs  ?Lab 07/09/21 ?1011 07/09/21 ?1225 07/10/21 ?0217  ?WBC 5.6  --  6.6  ?RBC 2.49*  --  2.64*  ?HGB 7.4* 7.1* 7.7*  ?HCT 25.6* 21.0* 25.2*  ?MCV 102.8*  --  95.5  ?MCH 29.7  --  29.2  ?MCHC 28.9*  --  30.6  ?RDW 18.1*  --  19.0*  ?PLT 187  --  161  ? ?Thyroid  ?Recent Labs  ?Lab 07/09/21 ?1617  ?TSH 7.285*  ?  ?BNP ?Recent Labs  ?Lab 07/09/21 ?1045  ?BNP 971.7*  ?  ?DDimer  ?Recent Labs  ?Lab 07/09/21 ?1046  ?DDIMER 1.40*  ?  ? ?Radiology  ?  ?CT Angio Chest PE W and/or Wo Contrast ? ?Result Date: 07/09/2021 ?CLINICAL DATA:  Positive D-dimer.  Pulmonary embolism suspected. EXAM: CT ANGIOGRAPHY CHEST WITH CONTRAST TECHNIQUE: Multidetector CT imaging of the chest was performed using the standard protocol during bolus administration of intravenous contrast. Multiplanar CT image reconstructions and MIPs were obtained to evaluate the vascular anatomy. RADIATION DOSE REDUCTION: This exam was performed according to the departmental dose-optimization program which  includes automated exposure control, adjustment of the mA and/or kV according to patient size and/or use of iterative reconstruction technique. CONTRAST:  73m OMNIPAQUE IOHEXOL 350 MG/ML SOLN COMPARISON:  AP chest 07/09/2021 06/06/2021; PET-CT 03/01/2021; CT abdomen and pelvis 11/07/2019 FINDINGS: Cardiovascular: The main pulmonary artery is opacified up to 439 Hounsfield units. There is low-density suspicious for a thrombus at the junction of the right lower lobar pulmonary artery extending into the posterior, lateral, anterior and medial segmental branches (axial series 5, images 81 through 98), suspicious for a pulmonary embolism. Associated low-density minimally extends into the distal aspect of the right main pulmonary artery. There may be punctate thrombus within the anterior aspect of the proximal portion of the right apical segmental pulmonary artery (axial image 66 of series 5). No definite left pulmonary artery embolism is seen. The main pulmonary artery measures up to 3.2 cm, mildly enlarged as can be seen with chronic pulmonary arterial hypertension. No CT evidence of right-sided heart strain. Moderate atherosclerotic calcifications within the thoracic aorta. No thoracic aortic aneurysm. There are dense coronary artery calcifications. Heart size is mildly enlarged. No pericardial effusion. Mediastinum/Nodes: No axillary lymphadenopathy. Note is made the prior PET-CT was performed for further evaluation of nodularity within the anterior mediastinum demonstrating only low metabolic activity and favored to be benign lymph nodes. Multiple anterior mediastinal nodules are again seen. An 8 mm short axis likely lymph node (axial series 5, image 54) is unchanged from 02/20/2021 PET-CT. Adjacent more left-sided anterior mediastinal 8 mm short axis lymph node (axial series 5, image 55) is also unchanged. Soft tissue density just inferior to these nodules appears unchanged from prior PET-CT. No definite hilar  lymphadenopathy is seen. 1 The visualized thyroid is grossly unremarkable. The esophagus follows a normal course and normal caliber. Lungs/Pleura: The central airways are patent. There is interval increase in now large right and moderate left pleural effusions compared to 02/20/2021 prior PET-CT. The right pleural  fluid extends along the lateral greater than medial pleura and mildly into the anterior pleura suggesting loculated pleural fluid. Moderate associated right lung volume loss. There is associated narrowing of the right lower lobe segmental airways. Diffuse right and patchy posterior left ground-glass opacities likely subsegmental atelectasis and/or pulmonary edema. No pneumothorax. Upper Abdomen: There are scattered calcifications again seen within the liver and spleen, possibly the sequela of remote granulomatous infection. Musculoskeletal: Mild multilevel degenerative disc changes of the thoracic spine. No aggressive lytic or blastic osseous lesion is seen. Review of the MIP images confirms the above findings. IMPRESSION:: IMPRESSION: 1. Pulmonary embolism within the [place] points of the right lower lobar pulmonary artery extending into the posterolateral anterior and medial segmental branches. Possible punctate thrombus within the proximal right apical segmental pulmonary artery. 2. Stable small lymph nodes within the anterior mediastinum demonstrating only mild uptake on prior PET and favored to be benign. 3. Interval worsening of now large right and moderate left pleural effusions with probable right-sided loculations. Associated moderate right lung volume loss. Right-greater-than-left ground-glass subsegmental atelectasis and/or pulmonary edema. Although no definite focal lung consolidation is seen, it is difficult to exclude underlying infection. Critical Value/emergent results were called by telephone at the time of interpretation on 07/09/2021 at 12:30 pm to provider [facility] , who  verbally acknowledged these results. Aortic Atherosclerosis (ICD10-I70.0). Electronically Signed   By: Yvonne Kendall M.D.   On: 07/09/2021 12:31  ? ?DG Chest Port 1 View ? ?Result Date: 07/10/2021 ?CLINICAL DATA:  [place]

## 2021-07-10 NOTE — Progress Notes (Signed)
Seen and examined on the floor with Dr. Bonner Puna. Wheezing, reduced right breath sounds. Adding bronchodilators. With recent C diff history will hold on empiric antibiotics until pleural fluid studies. Needs a small bore chest tube. Discussed risks, benefits, alternatives with he and his wife-- they agree. Anticipate he will need lytics. Patient confirmed he wants to remain full code, wife endorsed this. Transferring to ICU and will place chest tube on arrival. Full consult note to follow. ? ?Cody Hy, DO 07/10/21 8:05 AM ?[facility] ? ?

## 2021-07-10 NOTE — Progress Notes (Signed)
Rn x2 and RT transported pt on BIPAP to 7G01 without complication.  ?

## 2021-07-11 ENCOUNTER — Other Ambulatory Visit (HOSPITAL_COMMUNITY): Payer: Self-pay

## 2021-07-11 ENCOUNTER — Inpatient Hospital Stay (HOSPITAL_COMMUNITY): Payer: Medicare Other

## 2021-07-11 DIAGNOSIS — J90 Pleural effusion, not elsewhere classified: Secondary | ICD-10-CM | POA: Diagnosis not present

## 2021-07-11 DIAGNOSIS — Q998 Other specified chromosome abnormalities: Secondary | ICD-10-CM

## 2021-07-11 DIAGNOSIS — Z720 Tobacco use: Secondary | ICD-10-CM

## 2021-07-11 DIAGNOSIS — I2699 Other pulmonary embolism without acute cor pulmonale: Secondary | ICD-10-CM | POA: Diagnosis not present

## 2021-07-11 DIAGNOSIS — J9601 Acute respiratory failure with hypoxia: Secondary | ICD-10-CM | POA: Diagnosis not present

## 2021-07-11 DIAGNOSIS — N1832 Chronic kidney disease, stage 3b: Secondary | ICD-10-CM | POA: Diagnosis not present

## 2021-07-11 DIAGNOSIS — I5043 Acute on chronic combined systolic (congestive) and diastolic (congestive) heart failure: Secondary | ICD-10-CM | POA: Diagnosis not present

## 2021-07-11 DIAGNOSIS — E43 Unspecified severe protein-calorie malnutrition: Secondary | ICD-10-CM

## 2021-07-11 LAB — BODY FLUID CELL COUNT WITH DIFFERENTIAL
Eos, Fluid: 0 %
Lymphs, Fluid: 31 %
Monocyte-Macrophage-Serous Fluid: 68 % (ref 50–90)
Neutrophil Count, Fluid: 1 % (ref 0–25)
Total Nucleated Cell Count, Fluid: 16 cu mm (ref 0–1000)

## 2021-07-11 LAB — CBC
HCT: 26.4 % — ABNORMAL LOW (ref 39.0–52.0)
Hemoglobin: 8.1 g/dL — ABNORMAL LOW (ref 13.0–17.0)
MCH: 29.3 pg (ref 26.0–34.0)
MCHC: 30.7 g/dL (ref 30.0–36.0)
MCV: 95.7 fL (ref 80.0–100.0)
Platelets: 163 10*3/uL (ref 150–400)
RBC: 2.76 MIL/uL — ABNORMAL LOW (ref 4.22–5.81)
RDW: 19.5 % — ABNORMAL HIGH (ref 11.5–15.5)
WBC: 5.8 10*3/uL (ref 4.0–10.5)
nRBC: 0 % (ref 0.0–0.2)

## 2021-07-11 LAB — GLUCOSE, PLEURAL OR PERITONEAL FLUID: Glucose, Fluid: 87 mg/dL

## 2021-07-11 LAB — BASIC METABOLIC PANEL
Anion gap: 9 (ref 5–15)
BUN: 27 mg/dL — ABNORMAL HIGH (ref 8–23)
CO2: 27 mmol/L (ref 22–32)
Calcium: 7.6 mg/dL — ABNORMAL LOW (ref 8.9–10.3)
Chloride: 103 mmol/L (ref 98–111)
Creatinine, Ser: 2.08 mg/dL — ABNORMAL HIGH (ref 0.61–1.24)
GFR, Estimated: 35 mL/min — ABNORMAL LOW (ref >60)
Glucose, Bld: 99 mg/dL (ref 70–99)
Potassium: 3.7 mmol/L (ref 3.5–5.1)
Sodium: 139 mmol/L (ref 135–145)

## 2021-07-11 LAB — HEPARIN LEVEL (UNFRACTIONATED)
Heparin Unfractionated: 0.26 IU/mL — ABNORMAL LOW (ref 0.30–0.70)
Heparin Unfractionated: 0.4 IU/mL (ref 0.30–0.70)

## 2021-07-11 LAB — PROCALCITONIN: Procalcitonin: 4.52 ng/mL

## 2021-07-11 LAB — ALBUMIN, PLEURAL OR PERITONEAL FLUID: Albumin, Fluid: 1.6 g/dL

## 2021-07-11 IMAGING — DX DG CHEST 1V PORT
1 series · 1 of 1 positions shown · non-contrast
Comparison: [DATE] and CT chest [DATE].

CLINICAL DATA: Indwelling chest tube.

EXAM:
PORTABLE CHEST 1 VIEW

[chest ap]
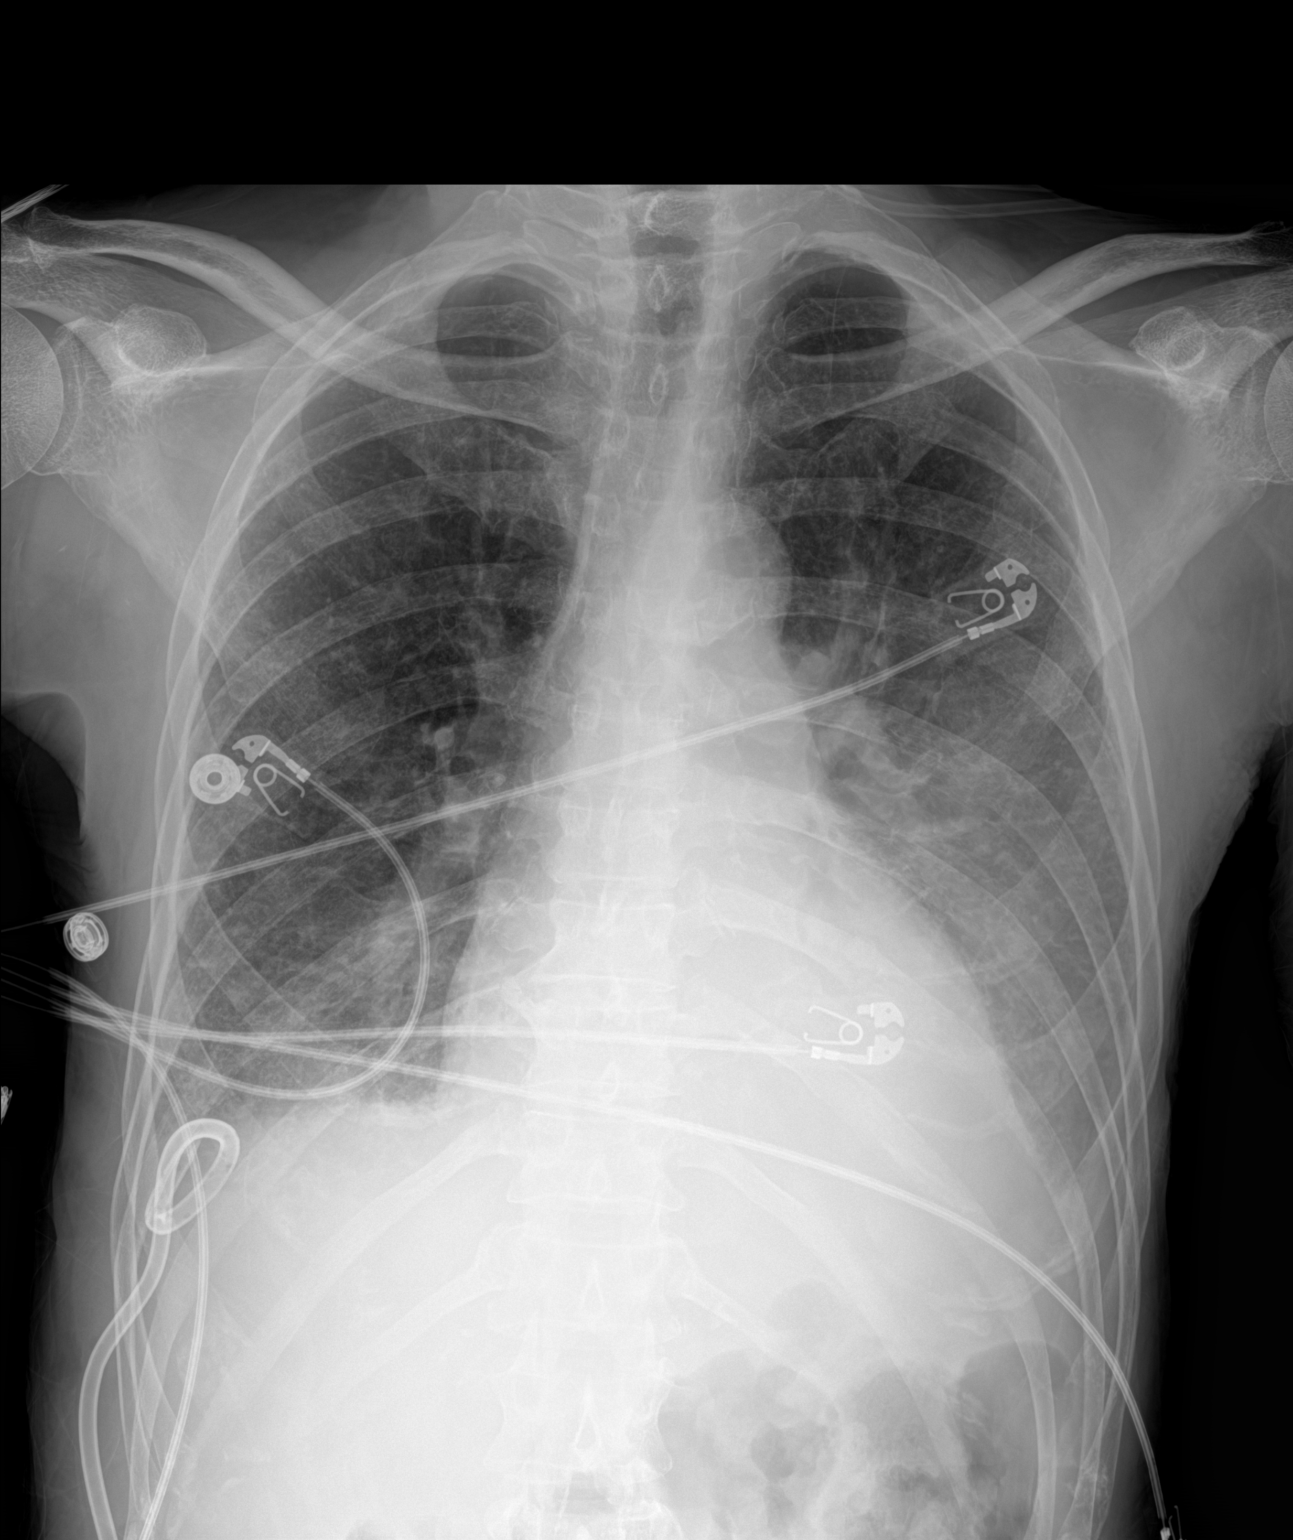

[1 of 1 positions shown; findings below may reference images not displayed]

FINDINGS: Trachea is midline. Heart is at the upper limits of normal in size
to mildly enlarged. Diffuse mixed interstitial and airspace
opacification, basilar dependent. Left lower lobe
collapse/consolidation. Bilateral pleural effusions. PleurX catheter
is in the lateral base of the right hemithorax. No pneumothorax.
IMPRESSION: 1. Right PleurX catheter in place without pneumothorax. Small right
pleural effusion.
2. Congestive heart failure and/or atypical/viral pneumonia.
3. Left lower lobe collapse/consolidation, possibly pneumonia.

## 2021-07-11 MED ORDER — ADULT MULTIVITAMIN W/MINERALS CH
1.0000 | ORAL_TABLET | Freq: Every day | ORAL | Status: DC
  Administered 2021-07-11 – 2021-07-13 (×3): 1 via ORAL
  Filled 2021-07-11 (×3): qty 1

## 2021-07-11 NOTE — Progress Notes (Signed)
?Progress Note ? ?Patient: [name]:[id number] DOB: [birth year]  ?DOA: 07/09/2021  DOS: 07/11/2021  ?  ?Brief hospital course: ?[name] is a 61 y.o. male with a history of scleroderma, HFrEF, stage IV CKD, recent C. diff infection and seizure who presented to the ED 3/18 with shortness of breath found to have acute PE, also felt to be volume overloaded with enlarging right pleural effusion w/loculations by CT, as well as anemia. 1u PRBCs given and pt admitted, developed respiratory distress for which she was transferred to the ICU under CCM service, pigtail chest tube placed with improvement in dyspnea, diuresis continued. Cardiology following, chest tube to water seal 3/20.  ? ?Assessment and Plan: ?* Acute pulmonary embolism (HCC) ?- IV heparin started, will continue to monitor for bleeding (hx GI AVMs). LE venous U/S negative for DVT. If no bleeding, convert to [place] soon. ?- Supplement oxygen as needed. ? ?Acute on chronic combined systolic and diastolic CHF (congestive heart failure) ([place]) ?New/enlarging pleural effusions, BNP 971.1. Echo w/LVEF 40-45%, global hypokinesis, G2DD in Feb 2023.  ?- Convert lasix 26m IV BID to torsemide this evening, cardiology consult appreciated.   ?- Weights, I/O, BMP ?- CKD limits ACE/ARB/ARNI/spiro ? ?Acute respiratory failure with hypoxia ([place] ?Multifactorial, primarily related to pleural effusions (enlarged on right with loculations, new on left). Now s/p pigtail 3/19. No lytics currently planned, placed to water seal today per PCCM. ?- Ambulate with pulse oximetry prior to discharge. ? ?Tobacco use ?Wheezing noted on arrival, though no Dx of COPD.  ?- Nicotine patch prn ?- Smoking cessation counseling ?- Consider formal PFTs after DC ?- Continue prn BDs ? ?C. difficile colitis ?Patient had just had stool studies checked by his PCP was noted to be still positive for C. difficile with reported symptoms. Recommended to start Dificid 200 mg twice daily, but  was not available at his pharmacy until Tuesday. ?- Started dificid on admission, will continue.  ?- IP recommends enteric isolation. Symptoms resolved. ? ?Elevated troponin ?Acute.  High-sensitivity troponin 41->60.  Patient reports chest discomfort has resolved.  Suspect secondary to demand in setting of pulmonary embolus and CHF exacerbation. ?- No inpatient work up planned per my discussion with cardiology, Dr. NAcie Fredrickson ? ?Pressure ulcer, stage III ([place] ?On physical exam patient noted to have signs of tunneling of the pressure ulcer left glute. ?-Low-air-loss mattress replaced ?-Wound care consult ? ?Hypokalemia ?Resolved with supplementation.  ?- Recheck in AM ? ?Hypocalcemia ?Calcium 7.5 on admission mildly low ionized calcium of 1.1.  ?- Continue Tums ? ?Seizure ([place] ?Patient had just recently been hospitalized for seizures last month.   ?- Continue lacosamide 100 mg twice daily ? ?Protein-calorie malnutrition, severe ?Dietitian consulted, supplement protein as able. ? ?Anemia in CKD (chronic kidney disease) ?Acute on chronic.  Patient has required blood transfusions in the past.  No reports of bleeding ?- s/p 1u PRBCs, monitor CBC ?- ESA would be appropriate, defer to nephrology. ? ?Pulmonary hypertension ([place] ?Followed by cardiology ?- Continue macitentan and sildenafil ? ?CKD (chronic kidney disease), stage III ([place] ?Suspect stage IIIb. Will monitor with diuresis. Cr up today, deescalate as above.  ?- Avoid nephrotoxins, monitor BMP ?- Follow up with nephrology, Dr. SJoelyn Oms as outpatient. ? ?Scleroderma ([place] ?No home medications at this time. ? ? ?Subjective: Breathing much better since chest tube. Pain is moderate, manageable. No diarrhea per pt. No chest pain.  ? ?Objective: ?Vitals:  ? 07/10/21 2000 07/11/21 0032 07/11/21 0400 07/11/21 0806  ?BP: ([name]  144/84 (!) 96/48 (!) 90/55   ?Pulse: 82 76 70   ?Resp: (!) _0 ?Temp: 97.9 ?F (36.6 ?C) 98 ?F (36.7 ?C) 98.1 ?F (36.7 ?C)   ?TempSrc: Oral Oral  Oral   ?SpO2: 99% 100% 99% 100%  ?Weight:   48.2 kg   ?Height:      ? ?Gen: Chronically ill-appearing 61 y.o. male in no distress ?Pulm: Nonlabored breathing [place] O2. Diminished at bases, no wheezes today. ?CV: Regular rate and rhythm. No murmur, rub, or gallop. No JVD, no pitting dependent edema. ?GI: Abdomen soft, non-tender, non-distended, with normoactive bowel sounds.  ?Ext: Warm, no deformities ?Skin: No new rashes, lesions or ulcers on visualized skin. ?Neuro: Alert and oriented. No focal neurological deficits. ?Psych: Judgement and insight appear fair. Mood euthymic & affect congruent. Behavior is appropriate.   ? ?Data Personally reviewed: ?CBC: ?Recent Labs  ?Lab 07/09/21 ?1011 07/09/21 ?1225 07/10/21 ?0217 07/11/21 ?0032  ?WBC 5.6  --  6.6 5.8  ?HGB 7.4* 7.1* 7.7* 8.1*  ?HCT 25.6* 21.0* 25.2* 26.4*  ?MCV 102.8*  --  95.5 95.7  ?PLT 187  --  161 163  ? ?Basic Metabolic Panel: ?Recent Labs  ?Lab 07/09/21 ?1011 07/09/21 ?1225 07/09/21 ?1619 07/10/21 ?0143 07/11/21 ?0032  ?NA 143 144  --  141 139  ?K 3.2* 3.1*  --  3.7 3.7  ?CL 108  --   --  107 103  ?CO2 27  --   --  26 27  ?GLUCOSE 91  --   --  78 99  ?BUN 22  --   --  21 27*  ?CREATININE 1.83*  --   --  1.87* 2.08*  ?CALCIUM 7.5*  --   --  7.7* 7.6*  ?MG  --   --  1.3*  --   --   ? ?GFR: ?Estimated Creatinine Clearance: 25.1 mL/min (A) (by C-G formula based on SCr of 2.08 mg/dL (H)). ?Liver Function Tests: ?Recent Labs  ?Lab 07/09/21 ?1011 07/10/21 ?1307  ?AST 39 35  ?ALT 30 23  ?ALKPHOS 94 85  ?BILITOT 0.4 0.5  ?PROT 5.7* 5.6*  ?ALBUMIN 2.1* 2.1*  ? ?No results for input(s): LIPASE, AMYLASE in the last 168 hours. ?Recent Labs  ?Lab 07/09/21 ?1046  ?AMMONIA 14  ? ?Coagulation Profile: ?No results for input(s): INR, PROTIME in the last 168 hours. ?Cardiac Enzymes: ?No results for input(s): CKTOTAL, CKMB, CKMBINDEX, TROPONINI in the last 168 hours. ?BNP (last 3 results) ?No results for input(s): PROBNP in the last 8760 hours. ?HbA1C: ?No results for input(s):  HGBA1C in the last 72 hours. ?CBG: ?Recent Labs  ?Lab 07/10/21 ?1313 07/10/21 ?1525  ?GLUCAP 129* 109*  ? ?Lipid Profile: ?No results for input(s): CHOL, HDL, LDLCALC, TRIG, CHOLHDL, LDLDIRECT in the last 72 hours. ?Thyroid Function Tests: ?Recent Labs  ?  07/09/21 ?1617  ?TSH 7.285*  ? ?Anemia Panel: ?No results for input(s): VITAMINB12, FOLATE, FERRITIN, TIBC, IRON, RETICCTPCT in the last 72 hours. ?Urine analysis: ?   ?Component Value Date/Time  ? COLORURINE YELLOW 07/10/2021 1249  ? APPEARANCEUR CLEAR 07/10/2021 1249  ? LABSPEC 1.019 07/10/2021 1249  ? PHURINE 8.0 07/10/2021 1249  ? GLUCOSEU 150 (A) 07/10/2021 1249  ? HGBUR NEGATIVE 07/10/2021 1249  ? BILIRUBINUR NEGATIVE 07/10/2021 1249  ? [place] NEGATIVE 07/10/2021 1249  ? PROTEINUR >=300 (A) 07/10/2021 1249  ? NITRITE NEGATIVE 07/10/2021 1249  ? LEUKOCYTESUR NEGATIVE 07/10/2021 1249  ? ?Recent Results (from the past 240 hour(s))  ?Blood  culture (routine x 2)     Status: None (Preliminary result)  ? Collection Time: 07/09/21  4:16 PM  ? Specimen: BLOOD RIGHT FOREARM  ?Result Value Ref Range Status  ? Specimen Description BLOOD RIGHT FOREARM  Final  ? Special Requests   Final  ?  BOTTLES DRAWN AEROBIC ONLY Blood Culture results may not be optimal due to an inadequate volume of blood received in culture bottles  ? Culture   Final  ?  NO GROWTH 2 DAYS ?Performed at [facility], [place] [address], [place], [place] [address] ?  ? Report Status PENDING  Incomplete  ?Blood culture (routine x 2)     Status: None (Preliminary result)  ? Collection Time: 07/09/21  4:34 PM  ? Specimen: BLOOD LEFT FOREARM  ?Result Value Ref Range Status  ? Specimen Description BLOOD LEFT FOREARM  Final  ? Special Requests   Final  ?  BOTTLES DRAWN AEROBIC AND ANAEROBIC Blood Culture adequate volume  ? Culture   Final  ?  NO GROWTH 2 DAYS ?Performed at [facility], [place] [address] [place], [place] [address] ?  ? Report Status PENDING  Incomplete  ?Body fluid culture w Gram  Stain     Status: None (Preliminary result)  ? Collection Time: 07/10/21 11:15 AM  ? Specimen: Pleura; Body Fluid  ?Result Value Ref Range Status  ? Specimen Description PLEURAL RIGHT  Final  ? Special

## 2021-07-11 NOTE — Consult Note (Signed)
? ?NAME:  [name], MRN:  [id number], DOB:  [birth year], LOS: 2 ?ADMISSION DATE:  07/09/2021, CONSULTATION DATE:  07/10/21 ?REFERRING MD:  Dr. Marlowe Sax, CHIEF COMPLAINT:  resp distress  ? ?History of Present Illness:  ? 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   ? ?Recently admitted 04/2021 with Cdiff (prescribed deficid after PCP rechecked stools but was unable to start prior to admit) and in 05/2021 with seizures and multiple electrolyte derangements.  Has been more sedentary since with development of pressure ulcer on his left glute.  ? ?Workup revealed acute segmental PE in RLL and possible punctate thrombus in the in RUL in additional to loculated right pleural effusion and moderate left pleural effusions.  He was admitted to [facility] and started on heparin gtt and diuresis.  On the morning of 3/19, patient again developed acute shortness of breath and respiratory distress requiring BiPAP.  Remains afebrile, mildly tachycardic, and SBP up into 160s.  Repeat CXR pending.  PCCM consulted for further evaluation.  ? 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 ?Significant Hospital Events: ?Including procedures, antibiotic start and stop dates in addition to other pertinent events   ?3/18 admitted with acute PE, large loculated R pleural effusion, and L moderate pleural effusion ?3/19 resp distress requiring bipap> tx to ICU ? ?Interim History / Subjective:  ?On BiPAP 12/8, 60% with TV 350-550 ? ?Objective   ?Blood pressure (!) 90/55, pulse 70, temperature 98.1 ?F  (36.7 ?C), temperature source Oral, resp. rate 18, height _0  (1.626 m), weight 48.2 kg, SpO2 100 %. ?   ?   ? ?Intake/Output Summary (Last 24 hours) at 07/11/2021 1446 ?Last data filed at 07/11/2021 1113 ?Gross per 24 hour  ?Intake 138.01 ml  ?Output 970 ml  ?Net -831.99 ml  ? ?Filed Weights  ? 07/09/21 1006 07/10/21 0445 07/11/21 0400  ?Weight: 47 kg 47 kg 48.2 kg  ? ?Examination: ?General:  chronically ill older appearing male sitting upright in bed bed on NIV, mildly anxious appearing ?HEENT: full NIV face mask, +jvd ?Neuro: awake, appropriate, f/c, MAE ?CV: rr, ST ?PULM:  tachypneic in the 20's occasional low 30s at times, diminished with expiratory wheeze, very diminished in right base ?GI:  +bs, NT ?Extremities: warm/dry, no LE edema, poor peripheral pulses, R great toe dry gangrene ?Skin: no rashes  ? ?UOP overnight 350m ?Repeat CXR this am> no change, large loculated R effusion, left effusion, decreased aeration  ? ?Resolved Hospital Problem list   ? ?Assessment & Plan:  ? ?Acute R segmental PE, submassive ?Right pleural effusion, loculated  ?Left moderate pleural effusion ?Tobacco abuse  ?Bronchospasm.  Given smoking hx, concern for COPD vs fluid  ?- continue BiPAP prn  ?- wean O2 for sat O2 goal > 92% ?- Right effusion appears well drained based on repeat CXR today ?- Place chest tube to water seal today ?- heparin per pharmacy, monitor closely for bleeding given prior GIBs.  Not ideal candidate for longterm AC.  May need IVC filter  ?- bilateral LE UK[place]egative for DVT. ?- nicotine patch prn  ? ?  PAH ?Elevated troponin ?Systolic and diastolic HF- EF 96-88%, A4GE ?Suspected PAD ?- cards following ?- continue slidenafil and macitentan ?- vascular consulted by [facility]  ?- diuresis per cards ? ?Left gluteal DPTI ?- WOC consulted ? ?CKD stage III ?- trend renal indices  ?- continue bicarb po ? ?Electrolyte derangements  ?- hypokalemia, hypomag, hypocalcemia ?- replete mag ?- trend on labs  ? ?Hx Seizures ?-  continue lacosamide 16m BID ? ?Anemia of chronic disease ?- on retacrit pta, due next week ?- s/p PRBC transfusion 3/18 ?- trend CBC, transfuse for Hgb < 7 ?- monitor for bleeding while on heparin given prior GIB ? ?Cdiff ?- recent as of January.  Stool recheck by PCP and prescribed dificid 208mBID but was not able to filled.  Continue here with enteric precautions ? ?Poor mobility ?Protein calorie malnutrition ?- will need PT/ OT consults when stable ?- RD consult ? ?Best Practice (right click and "Reselect all SmartList Selections" daily)  ? ?Per primary ? ?Labs   ?CBC: ?Recent Labs  ?Lab 07/09/21 ?1011 07/09/21 ?1225 07/10/21 ?0217 07/11/21 ?0032  ?WBC 5.6  --  6.6 5.8  ?HGB 7.4* 7.1* 7.7* 8.1*  ?HCT 25.6* 21.0* 25.2* 26.4*  ?MCV 102.8*  --  95.5 95.7  ?PLT 187  --  161 163  ? ? ?Basic Metabolic Panel: ?Recent Labs  ?Lab 07/09/21 ?1011 07/09/21 ?1225 07/09/21 ?1619 07/10/21 ?0143 07/11/21 ?0032  ?NA 143 144  --  141 139  ?K 3.2* 3.1*  --  3.7 3.7  ?CL 108  --   --  107 103  ?CO2 27  --   --  26 27  ?GLUCOSE 91  --   --  78 99  ?BUN 22  --   --  21 27*  ?CREATININE 1.83*  --   --  1.87* 2.08*  ?CALCIUM 7.5*  --   --  7.7* 7.6*  ?MG  --   --  1.3*  --   --   ? ?GFR: ?Estimated Creatinine Clearance: 25.1 mL/min (A) (by C-G formula based on SCr of 2.08 mg/dL (H)). ?Recent Labs  ?Lab 07/09/21 ?1011 07/09/21 ?1130 07/09/21 ?1305 07/10/21 ?0217 07/11/21 ?0032  ?PROCALCITON  --   --   --   --  4.52  ?WBC 5.6  --   --  6.6 5.8  ?LATICACIDVEN  --  1.1 1.1  --   --   ? ? ?Liver Function Tests: ?Recent Labs  ?Lab 07/09/21 ?1011 07/10/21 ?1307  ?AST 39 35  ?ALT 30 23  ?ALKPHOS 94 85  ?BILITOT 0.4 0.5  ?PROT 5.7* 5.6*  ?ALBUMIN 2.1* 2.1*  ? ?No results for input(s): LIPASE, AMYLASE in the last 168 hours. ?Recent Labs  ?Lab 07/09/21 ?1046  ?AMMONIA 14  ? ? ?ABG ?   ?Component Value Date/Time  ? PHART 7.460 (H) 07/09/2021 1225  ? PCO2ART 37.5 07/09/2021 1225  ? PO2ART 90 07/09/2021 1225  ? HCO3 26.7 07/09/2021 1225  ? TCO2  28 07/09/2021 1225  ? ACIDBASEDEF 3.0 (H) 06/03/2021 1628  ? O2SAT 98 07/09/2021 1225  ?  ? ?Coagulation Profile: ?No results for input(s): INR, PROTIME in the last 168 hours. ? ?Cardiac Enzymes: ?No results for input(s): CKTOTAL, CKMB, CKMBINDEX, TROPONINI in the last 168 hours. ? ?HbA1C: ?Hgb A1c MFr Bld  ?Date/Time Value Ref Range Status  ?10/09/2019 04:50 AM 4.9 4.8 - 5.6 % Final  ?  Comment:  ?  (NOTE) ?  Prediabetes: 5.7 - 6.4 ?        Diabetes: >6.4 ?        Glycemic control for adults with diabetes: <7.0 ?  ? ? ?CBG: ?Recent Labs  ?Lab 07/10/21 ?1313 07/10/21 ?1525  ?GLUCAP 129* 109*  ? ? ?Critical care time: n/a ?  ? ?Freda Jackson, MD ?[facility] ?Office: [phone] ? ? ?See Amion for personal pager ?PCCM on call pager [phone] until 7pm. ?Please call Elink 7p-7a. [phone] ? ? ? ? ? ?

## 2021-07-11 NOTE — Assessment & Plan Note (Signed)
Dietitian consulted, supplement protein as able. ?

## 2021-07-11 NOTE — Assessment & Plan Note (Signed)
Wheezing noted on arrival, though no Dx of COPD.  ?- Nicotine patch prn ?- Smoking cessation counseling ?- Consider formal PFTs after DC ?- Continue prn BDs ?

## 2021-07-11 NOTE — Progress Notes (Signed)
Heart Failure Navigator Progress Note ? ?Assessed for Heart & Vascular TOC clinic readiness.  ?Patient does not meet criteria due to prior to hospitalization patient follows with Dr. Haroldine Laws in AHF clinic.  ? ?Navigator available for educational resources. ? ?Pricilla Holm, MSN, RN ?Heart Failure Nurse Navigator ?[phone] ? ? ?

## 2021-07-11 NOTE — Progress Notes (Signed)
Initial Nutrition Assessment ? ?DOCUMENTATION CODES:  ? ?Underweight, Severe malnutrition in context of chronic illness ? ?INTERVENTION:  ? ?- Liberalize diet to 2 gram sodium ? ?- MVI with minerals daily ? ?- Mighty Shake TID with meals, each supplement provides 330 kcal and 9 grams of protein ? ?- Encourage PO intake ? ?NUTRITION DIAGNOSIS:  ? ?Severe Malnutrition related to chronic illness (CHF, CKD stage IV) as evidenced by severe fat depletion, severe muscle depletion. ? ?GOAL:  ? ?Patient will meet greater than or equal to 90% of their needs ? ?MONITOR:  ? ?PO intake, Supplement acceptance, Labs, Weight trends, Skin, I & O's ? ?REASON FOR ASSESSMENT:  ? ?Consult ?Assessment of nutrition requirement/status ? ?ASSESSMENT:  ? ?61 year old male who presented to the ED on 3/18 with SOB. PMH of asthma, CAD s/p PCI, CHF, scleroderma, HTN, HLD, seizures, previous pericardial effusions, SBO, hypothyroidism, tobacco abuse, anxiety, CKD stage IV, chronic GI bleeds secondary to AVMs. Pt admitted with acute pulmonary embolism, pleural effusions. ? ?03/19 - transferred to ICU, s/p chest tube insertion ? ?Spoke with pt and wife at bedside. Pt's wife reports pt has a good appetite and eats well. Pt states that he ate some chicken and mashed potatoes for lunch today. Pt's wife shares that pt typically eats 3 meals daily and snacks. Pt's portions at meals are small. Pt snacks on Little Debbie cakes and cookies. He does not strictly following a low sodium diet. Pt's wife reports that pt eats fairly well and neither her nor the patient are concerned about his eating. ? ?Pt's wife reports that pt has not weighed over 120 lbs through most of his adult life. She reports that many years ago he gained weight up to 155 lbs when she was pregnant but that was the most he has ever weighed. She reports that last week at a doctor's appointment, pt weighed 113 lbs but this was due to fluid. Reviewed weight and height history in chart. Pt's  height has been documented as anywhere between _0  to _1 . Pt with a decline in weight over the last month. Pt with a 4 kg weight loss since 05/31/21. This is a 7.7% weight loss in 1.5 months which is significant for timeframe. However, difficult to determine how much of weight loss can be attributed to fluid shifts vs true dry weight loss. ? ?Pt's wife shares that pt's dentures have not been fitting well recently. Pt is edentulous at this time. RD offered to downgrade diet to dysphagia 3 for ease of intake but pt and wife refused. Pt's wife states that she is present at his bedside 99% of the time and cuts his food for him. ? ?Pt meets criteria for severe malnutrition. RD offered to order Ensure oral nutrition supplements but pt declined. Pt states that he drank these previously because he knew that he had to but did not like them. RD will order Mighty Shakes with meals and assess pt tolerance. Will liberalize diet to 2 gram sodium and order daily MVI which pt's wife reports he takes at home. ? ?Admit weight: 47 kg ?Current weight: 48.2 kg ? ?Medications reviewed and include: calcium carbonate, dificid, IV lasix, protonix, sodium bicarb 1300 mg TID, heparin drip ? ?Labs reviewed: BUN 27, creatinine 2.08, magnesium 1.3 on 3/18, hemoglobin 8.1 ?CBG's: 109-129 ? ?UOP: 795 x 24 hours ?CT: 2800 ml x 24 hours ?I/O's: -3.0 L since admit ? ?NUTRITION - FOCUSED PHYSICAL EXAM: ? ?Flowsheet Row Most Recent Value  ?  Orbital Region Severe depletion  ?Upper Arm Region Severe depletion  ?Thoracic and Lumbar Region Severe depletion  ?Buccal Region Severe depletion  ?Temple Region Moderate depletion  ?Clavicle Bone Region Severe depletion  ?Clavicle and Acromion Bone Region Severe depletion  ?Scapular Bone Region Severe depletion  ?Dorsal Hand Severe depletion  ?Patellar Region Severe depletion  ?Anterior Thigh Region Severe depletion  ?Posterior Calf Region Severe depletion  ?Edema (RD Assessment) None  ?Hair Reviewed  ?Eyes  Reviewed  ?Mouth Reviewed  ?Skin Reviewed  ?Nails Reviewed  ? ?  ? ? ?Diet Order:   ?Diet Order   ? ?       ?  Diet 2 gram sodium Room service appropriate? Yes; Fluid consistency: Thin  Diet effective now       ?  ? ?  ?  ? ?  ? ? ?EDUCATION NEEDS:  ? ?Education needs have been addressed ? ?Skin:  Skin Assessment: ?Skin Integrity Issues: ?Stage III: sacrum ? ?Last BM:  07/10/21 small type 6 ? ?Height:  ? ?Ht Readings from Last 1 Encounters:  ?07/09/21 _0  (1.626 m)  ? ? ?Weight:  ? ?Wt Readings from Last 1 Encounters:  ?07/11/21 48.2 kg  ? ? ?BMI:  Body mass index is 18.24 kg/m?. ? ?Estimated Nutritional Needs:  ? ?Kcal:  1700-1900 ? ?Protein:  85-100 grams ? ?Fluid:  1.7-1.9 L ? ? ? ?Gustavus Bryant, MS, RD, LDN ?Inpatient Clinical Dietitian ?Please see AMiON for contact information. ? ?

## 2021-07-11 NOTE — Progress Notes (Signed)
? ?Progress Note ? ?Patient Name: [name] ?Date of Encounter: 07/11/2021 ? ?[facility] Cardiologist: Cody Burow, MD  ? ?Subjective  ? ?[name] is a 61 year old gentleman with a history of scleroderma, pulmonary hypertension, loculated right pleural effusion who was admitted with worsening shortness of breath.  Was found to have pulmonary emboli.  Has been admitted by the PCCM team and then later at the hospital team.  He is not having any current cardiac complaints.  He is breathing a little bit better.  He had a chest tube placed. ? ?He is eating ok  ?Follows with Cody Furbish, MD  ? ?Inpatient Medications  ?  ?Scheduled Meds: ? calcium carbonate  1 tablet Oral BID WC  ? Chlorhexidine Gluconate Cloth  6 each Topical Daily  ? fidaxomicin  200 mg Oral BID  ? furosemide  40 mg Intravenous BID  ? lacosamide  100 mg Oral BID  ? macitentan  10 mg Oral Daily  ? pantoprazole  40 mg Oral Daily  ? QUEtiapine  25 mg Oral QHS  ? rosuvastatin  20 mg Oral Daily  ? sildenafil  40 mg Oral TID  ? sodium bicarbonate  1,300 mg Oral TID  ? sodium chloride flush  10 mL Other Q8H  ? sodium chloride flush  3 mL Intravenous Q12H  ? umeclidinium-vilanterol  1 puff Inhalation Daily  ? ?Continuous Infusions: ? heparin 1,150 Units/hr (07/11/21 0215)  ? ?PRN Meds: ?acetaminophen **OR** acetaminophen, albuterol, hydrALAZINE, HYDROcodone-acetaminophen, ipratropium-albuterol, nicotine, ondansetron **OR** ondansetron (ZOFRAN) IV  ? ?Vital Signs  ?  ?Vitals:  ? 07/10/21 2000 07/11/21 0032 07/11/21 0400 07/11/21 0806  ?BP: (!) 144/84 (!) 96/48 (!) 90/55   ?Pulse: 82 76 70   ?Resp: (!) _0 ?Temp: 97.9 ?F (36.6 ?C) 98 ?F (36.7 ?C) 98.1 ?F (36.7 ?C)   ?TempSrc: Oral Oral Oral   ?SpO2: 99% 100% 99% 100%  ?Weight:   48.2 kg   ?Height:      ? ? ?Intake/Output Summary (Last 24 hours) at 07/11/2021 1042 ?Last data filed at 07/11/2021 0600 ?Gross per 24 hour  ?Intake 236.04 ml  ?Output 3595 ml  ?Net -3358.96 ml  ? ?Last 3 Weights  07/11/2021 07/10/2021 07/09/2021  ?Weight (lbs) 106 lb 4.2 oz 103 lb 9.9 oz 103 lb 9.9 oz  ?Weight (kg) 48.2 kg 47 kg 47 kg  ?   ? ?Telemetry  ?  ?NSR  - Personally Reviewed ? ?ECG  ?  ? - Personally Reviewed ? ?Physical Exam  ? ?GEN: extremely frail, weak appearing make,  NAD , R sided chest tube in plae.    ?Neck: No JVD ?Cardiac: RRR, no murmurs, rubs, or gallops.  ?Respiratory: Clear to auscultation bilaterally. ?GI: Soft, nontender, non-distended  ?MS: No edema; No deformity. ?Neuro:  Nonfocal  ?Psych: Normal affect  ? ?Labs  ?  ?High Sensitivity Troponin:   ?Recent Labs  ?Lab 07/09/21 ?1046 07/09/21 ?1305 07/10/21 ?0710  ?TROPONINIHS 41* 60* 57*  ?   ?Chemistry ?Recent Labs  ?Lab 07/09/21 ?1011 07/09/21 ?1225 07/09/21 ?1619 07/10/21 ?0143 07/10/21 ?1307 07/11/21 ?0032  ?NA 143 144  --  141  --  139  ?K 3.2* 3.1*  --  3.7  --  3.7  ?CL 108  --   --  107  --  103  ?CO2 27  --   --  26  --  27  ?GLUCOSE 91  --   --  78  --  99  ?BUN 22  --   --  21  --  27*  ?CREATININE 1.83*  --   --  1.87*  --  2.08*  ?CALCIUM 7.5*  --   --  7.7*  --  7.6*  ?MG  --   --  1.3*  --   --   --   ?PROT 5.7*  --   --   --  5.6*  --   ?ALBUMIN 2.1*  --   --   --  2.1*  --   ?AST 39  --   --   --  35  --   ?ALT 30  --   --   --  23  --   ?ALKPHOS 94  --   --   --  85  --   ?BILITOT 0.4  --   --   --  0.5  --   ?GFRNONAA 41*  --   --  40*  --  35*  ?ANIONGAP 8  --   --  8  --  9  ?  ?Lipids No results for input(s): CHOL, TRIG, HDL, LABVLDL, LDLCALC, CHOLHDL in the last 168 hours.  ?Hematology ?Recent Labs  ?Lab 07/09/21 ?1011 07/09/21 ?1225 07/10/21 ?0217 07/11/21 ?0032  ?WBC 5.6  --  6.6 5.8  ?RBC 2.49*  --  2.64* 2.76*  ?HGB 7.4* 7.1* 7.7* 8.1*  ?HCT 25.6* 21.0* 25.2* 26.4*  ?MCV 102.8*  --  95.5 95.7  ?MCH 29.7  --  29.2 29.3  ?MCHC 28.9*  --  30.6 30.7  ?RDW 18.1*  --  19.0* 19.5*  ?PLT 187  --  161 163  ? ?Thyroid  ?Recent Labs  ?Lab 07/09/21 ?1617  ?TSH 7.285*  ?  ?BNP ?Recent Labs  ?Lab 07/09/21 ?1045  ?BNP 971.7*  ?  ?DDimer   ?Recent Labs  ?Lab 07/09/21 ?1046  ?DDIMER 1.40*  ?  ? ?Radiology  ?  ?CT Angio Chest PE W and/or Wo Contrast ? ?Result Date: 07/09/2021 ?CLINICAL DATA:  Positive D-dimer.  Pulmonary embolism suspected. EXAM: CT ANGIOGRAPHY CHEST WITH CONTRAST TECHNIQUE: Multidetector CT imaging of the chest was performed using the standard protocol during bolus administration of intravenous contrast. Multiplanar CT image reconstructions and MIPs were obtained to evaluate the vascular anatomy. RADIATION DOSE REDUCTION: This exam was performed according to the departmental dose-optimization program which includes automated exposure control, adjustment of the mA and/or kV according to patient size and/or use of iterative reconstruction technique. CONTRAST:  24m OMNIPAQUE IOHEXOL 350 MG/ML SOLN COMPARISON:  AP chest 07/09/2021 06/06/2021; PET-CT 03/01/2021; CT abdomen and pelvis 11/07/2019 FINDINGS: Cardiovascular: The main pulmonary artery is opacified up to 439 Hounsfield units. There is low-density suspicious for a thrombus at the junction of the right lower lobar pulmonary artery extending into the posterior, lateral, anterior and medial segmental branches (axial series 5, images 81 through 98), suspicious for a pulmonary embolism. Associated low-density minimally extends into the distal aspect of the right main pulmonary artery. There may be punctate thrombus within the anterior aspect of the proximal portion of the right apical segmental pulmonary artery (axial image 66 of series 5). No definite left pulmonary artery embolism is seen. The main pulmonary artery measures up to 3.2 cm, mildly enlarged as can be seen with chronic pulmonary arterial hypertension. No CT evidence of right-sided heart strain. Moderate atherosclerotic calcifications within the thoracic aorta. No thoracic aortic aneurysm. There are dense coronary artery calcifications. Heart size is mildly enlarged. No pericardial  effusion. Mediastinum/Nodes: No axillary  lymphadenopathy. Note is made the prior PET-CT was performed for further evaluation of nodularity within the anterior mediastinum demonstrating only low metabolic activity and favored to be benign lymph nodes. Multiple anterior mediastinal nodules are again seen. An 8 mm short axis likely lymph node (axial series 5, image 54) is unchanged from 02/20/2021 PET-CT. Adjacent more left-sided anterior mediastinal 8 mm short axis lymph node (axial series 5, image 55) is also unchanged. Soft tissue density just inferior to these nodules appears unchanged from prior PET-CT. No definite hilar lymphadenopathy is seen. 1 The visualized thyroid is grossly unremarkable. The esophagus follows a normal course and normal caliber. Lungs/Pleura: The central airways are patent. There is interval increase in now large right and moderate left pleural effusions compared to 02/20/2021 prior PET-CT. The right pleural fluid extends along the lateral greater than medial pleura and mildly into the anterior pleura suggesting loculated pleural fluid. Moderate associated right lung volume loss. There is associated narrowing of the right lower lobe segmental airways. Diffuse right and patchy posterior left ground-glass opacities likely subsegmental atelectasis and/or pulmonary edema. No pneumothorax. Upper Abdomen: There are scattered calcifications again seen within the liver and spleen, possibly the sequela of remote granulomatous infection. Musculoskeletal: Mild multilevel degenerative disc changes of the thoracic spine. No aggressive lytic or blastic osseous lesion is seen. Review of the MIP images confirms the above findings. IMPRESSION:: IMPRESSION: 1. Pulmonary embolism within the branch points of the right lower lobar pulmonary artery extending into the posterolateral anterior and medial segmental branches. Possible punctate thrombus within the proximal right apical segmental pulmonary artery. 2. Stable small lymph nodes within the  anterior mediastinum demonstrating only mild uptake on prior PET and favored to be benign. 3. Interval worsening of now large right and moderate left pleural effusions with probable right-sided loculations. Assoc

## 2021-07-11 NOTE — Consult Note (Signed)
WOC Nurse Consult Note: ?Reason for Consult: Patient with Stage 3  pressure injury to coccyx. Wife presnt for my assessment.   ?Wound type:Pressure ?Pressure Injury POA: Yes ?Measurement:1.6cm x 1.2cm x 0.8cm ?Wound bed: deep red, small amount of red/brown (serosanguinous) exudate. ?Drainage (amount, consistency, odor) As noted above ?Periwound: macerated ?Dressing procedure/placement/frequency: Patient and wife educated on need to position off of supine position for wound healing. Topical care guidance for Nursing provided using a once-daily "strip" of silver hydrofiber (Aquacel Ag+ Advantage) topped with dry gauze and covered with a piece of silicone foam for the sacrum oriented with the "tip" pointing away from the anus for best coverage. Supportive POC for the integumentary system includes turning and reposition from side to side, floating heels and keep in the [facility] at or below a 30 degree angle. ? ?[place] nursing team will not follow, but will remain available to this patient, the nursing and medical teams.  Please re-consult if needed. ?Thanks, ?Maudie Flakes, MSN, RN, [place], [place], CWON-AP, [place]  ?Pager# [phone]  ? ? ? ?  ?

## 2021-07-11 NOTE — Evaluation (Signed)
Physical Therapy Evaluation ?Patient Details ?Name: [name] ?MRN: [id number] ?DOB: [birth year] ?Today's Date: 07/11/2021 ? ?History of Present Illness ? Patient is a 61 y/o male who presents on 07/09/21 with SOB and chest pain. Admitted with acute segmental PE in RLL and possible punctate thrombus in the RUL in addition to loculated right pleural effusion and moderate left pleural effusions.s/p chest tube placement 07/10/21.  PMH includes CAD, CHF, HTN, CKD IV, Raynard's, seizures.  ?Clinical Impression ? Patient presents with generalized weakness, impaired balance, decreased activity tolerance and pain s/p above. Pt lives with spouse and reports being independent for ADLs and walking PTA. Today, pt requires Min A for all mobility with use of RW for support. VSS on RA. Chest tube bandage saturated upon arrival. RN made aware. Encouraged OOB to chair for all meals. Will follow acutely to maximize independence and mobility prior to return home.   ?   ? ?Recommendations for follow up therapy are one component of a multi-disciplinary discharge planning process, led by the attending physician.  Recommendations may be updated based on patient status, additional functional criteria and insurance authorization. ? ?Follow Up Recommendations No PT follow up ? ?  ?Assistance Recommended at Discharge Intermittent Supervision/Assistance  ?Patient can return home with the following ? A little help with bathing/dressing/bathroom;Assistance with cooking/housework;Assist for transportation;Help with stairs or ramp for entrance ? ?  ?Equipment Recommendations None recommended by PT  ?Recommendations for Other Services ?    ?  ?Functional Status Assessment Patient has had a recent decline in their functional status and demonstrates the ability to make significant improvements in function in a reasonable and predictable amount of time.  ? ?  ?Precautions / Restrictions Precautions ?Precautions: Fall;Other (comment) ?Precaution  Comments: chest tube to suction ?Restrictions ?Weight Bearing Restrictions: No  ? ?  ? ?Mobility ? Bed Mobility ?Overal bed mobility: Needs Assistance ?Bed Mobility: Rolling, Sidelying to Sit ?Rolling: Min assist ?Sidelying to sit: Min assist, HOB elevated ?  ?  ?  ?General bed mobility comments: increased time, assist with trunk to get to EOB. Cues for technique. ?  ? ?Transfers ?Overall transfer level: Needs assistance ?Equipment used: Rolling walker (2 wheels) ?Transfers: Sit to/from Stand ?Sit to Stand: Min guard ?  ?  ?  ?  ?  ?General transfer comment: Min guard for safety. Stood from [employer]. Cues for hand placement. ?  ? ?Ambulation/Gait ?Ambulation/Gait assistance: Min guard ?Gait Distance (Feet): 35 Feet ?Assistive device: Rolling walker (2 wheels) ?Gait Pattern/deviations: Step-through pattern, Decreased stride length, Narrow base of support, Trunk flexed ?Gait velocity: decreased ?Gait velocity interpretation: <1.8 ft/sec, indicate of risk for recurrent falls ?  ?General Gait Details: Slow, guarded gait with narrow boS. no evidence of imbalance. ? ?Stairs ?  ?  ?  ?  ?  ? ?Wheelchair Mobility ?  ? ?Modified Rankin (Stroke Patients Only) ?  ? ?  ? ?Balance Overall balance assessment: Needs assistance ?Sitting-balance support: Feet supported, No upper extremity supported ?Sitting balance-Leahy Scale: Good ?  ?  ?Standing balance support: During functional activity, Reliant on assistive device for balance ?Standing balance-Leahy Scale: Poor ?  ?  ?  ?  ?  ?  ?  ?  ?  ?  ?  ?  ?   ? ? ? ?Pertinent Vitals/Pain Pain Assessment ?Pain Assessment: Faces ?Faces Pain Scale: Hurts little more ?Pain Location: chest tube placement ?Pain Descriptors / Indicators: Sore ?Pain Intervention(s): Monitored during session  ? ? ?Home Living  Family/patient expects to be discharged to:: Private residence ?Living Arrangements: Spouse/significant other ?Available Help at Discharge: Family ?Type of Home: House ?Home Access: Stairs  to enter ?Entrance Stairs-Rails: Left ?Entrance Stairs-Number of Steps: 4 ?  ?Home Layout: One level ?Home Equipment: Hospital bed;Rolling [place] (2 wheels) ?   ?  ?Prior Function Prior Level of Function : Independent/Modified Independent ?  ?  ?  ?  ?  ?  ?Mobility Comments: Using RW prior to last d/c fromt he hospital less than 1 month ago but weaned himself off. ?ADLs Comments: independent ?  ? ? ?Hand Dominance  ? Dominant Hand: Right ? ?  ?Extremity/Trunk Assessment  ? Upper Extremity Assessment ?Upper Extremity Assessment: Defer to OT evaluation ?  ? ?Lower Extremity Assessment ?Lower Extremity Assessment: Generalized weakness ?  ? ?Cervical / Trunk Assessment ?Cervical / Trunk Assessment: Normal  ?Communication  ? Communication: No difficulties  ?Cognition Arousal/Alertness: Awake/alert ?Behavior During Therapy: [facility] for tasks assessed/performed ?Overall Cognitive Status: Impaired/Different from baseline ?Area of Impairment: Memory, Awareness ?  ?  ?  ?  ?  ?  ?  ?  ?  ?  ?Memory: Decreased short-term memory ?  ?  ?Awareness: Emergent ?  ?  ?  ?  ? ?  ?General Comments General comments (skin integrity, edema, etc.): Spouse present during session. Chest tube incision site saturated. RN made aware and addressed during session. ? ?  ?Exercises    ? ?Assessment/Plan  ?  ?PT Assessment Patient needs continued PT services  ?PT Problem List Decreased strength;Decreased mobility;Pain;Decreased balance;Decreased activity tolerance;Decreased cognition ? ?   ?  ?PT Treatment Interventions Therapeutic exercise;Gait training;Patient/family education;Therapeutic activities;Functional mobility training;Balance training   ? ?PT Goals (Current goals can be found in the Care Plan section)  ?Acute Rehab PT Goals ?Patient Stated Goal: not get stiff ?PT Goal Formulation: With patient ?Time For Goal Achievement: 07/25/21 ?Potential to Achieve Goals: Fair ? ?  ?Frequency Min 3X/week ?  ? ? ?Co-evaluation   ?  ?  ?  ?  ? ? ?  ?AM-PAC  PT "6 Clicks" Mobility  ?Outcome Measure Help needed turning from your back to your side while in a flat bed without using bedrails?: A Little ?Help needed moving from lying on your back to sitting on the side of a flat bed without using bedrails?: A Little ?Help needed moving to and from a bed to a chair (including a wheelchair)?: A Little ?Help needed standing up from a chair using your arms (e.g., wheelchair or bedside chair)?: A Little ?Help needed to walk in hospital room?: A Little ?Help needed climbing 3-5 steps with a railing? : A Little ?6 Click Score: 18 ? ?  ?End of Session   ?Activity Tolerance: Patient tolerated treatment well;Patient limited by fatigue ?Patient left: in bed;with call bell/phone within reach;with nursing/sitter in room ?Nurse Communication: Mobility status;Other (comment) (CT bandage) ?PT Visit Diagnosis: Muscle weakness (generalized) (M62.81);Difficulty in walking, not elsewhere classified (R26.2) ?  ? ?Time: 3893-7342 ?PT Time Calculation (min) (ACUTE ONLY): 27 min ? ? ?Charges:   PT Evaluation ?$PT Eval Moderate Complexity: 1 Mod ?PT Treatments ?$Therapeutic Activity: 8-22 mins ?  ?   ? ? ?Marisa Severin, PT, DPT ?Acute Rehabilitation Services ?Pager [phone] ?Office [phone] ? ? ? ? ?[place] ?07/11/2021, 3:45 PM ? ?

## 2021-07-11 NOTE — Plan of Care (Signed)
?  Problem: Clinical Measurements: ?Goal: Ability to maintain clinical measurements within normal limits will improve ?Outcome: Progressing ?Goal: Will remain free from infection ?Outcome: Progressing ?Goal: Respiratory complications will improve ?Outcome: Progressing ?Goal: Cardiovascular complication will be avoided ?Outcome: Progressing ?  ?Problem: Elimination: ?Goal: Will not experience complications related to urinary retention ?Outcome: Progressing ?  ?Problem: Pain Managment: ?Goal: General experience of comfort will improve ?Outcome: Progressing ?  ?

## 2021-07-11 NOTE — Assessment & Plan Note (Addendum)
Multifactorial, primarily related to pleural effusions (enlarged on right with loculations, new on left). Now s/p pigtail 3/19 - 3/21.  ?- Pleural effusion may recur (?realted to third-spacing from malnutrition) ?- No longer hypoxic. ?

## 2021-07-11 NOTE — Progress Notes (Signed)
Bipap order is now PRN.  Pt in no distressing requiring device at this time.  RT will monitor. ?

## 2021-07-11 NOTE — Progress Notes (Signed)
ANTICOAGULATION CONSULT NOTE - Follow Up Consult ? ?Pharmacy Consult for heparin ?Indication: pulmonary embolus ? ?Labs: ?Recent Labs  ?  07/09/21 ?1011 07/09/21 ?1046 07/09/21 ?1225 07/09/21 ?1225 07/09/21 ?1305 07/10/21 ?0143 07/10/21 ?0217 07/10/21 ?0710 07/10/21 ?1307 07/10/21 ?2120 07/11/21 ?0032  ?HGB 7.4*  --  7.1*  --   --   --  7.7*  --   --   --  8.1*  ?HCT 25.6*  --  21.0*  --   --   --  25.2*  --   --   --  26.4*  ?PLT 187  --   --   --   --   --  161  --   --   --  163  ?HEPARINUNFRC  --   --   --    < >  --  <0.10*  --   --  0.20* 0.32 0.26*  ?CREATININE 1.83*  --   --   --   --  1.87*  --   --   --   --  2.08*  ?TROPONINIHS  --  41*  --   --  60*  --   --  57*  --   --   --   ? < > = values in this interval not displayed.  ? ? ?Assessment: ?60yo male subtherapeutic on heparin after one level at low end of goal; no infusion issues or signs of bleeding per RN. ? ?Goal of Therapy:  ?Heparin level 0.3-0.7 units/ml ?  ?Plan:  ?Will increase heparin infusion by 2 units/kg/hr to 1150 units/hr and check level in 8 hours.   ? ?Wynona Neat, PharmD, BCPS  ?07/11/2021,2:15 AM ? ? ?

## 2021-07-11 NOTE — Progress Notes (Signed)
ANTICOAGULATION CONSULT NOTE ? ?Pharmacy Consult for heparin ?Indication: pulmonary embolus ? ?Allergies  ?Allergen Reactions  ? Dilantin [Phenytoin]   ?  Made him confused, manic  ? Oxycodone Other (See Comments)  ?  Hallucinations  ? ? ?Patient Measurements: ?Height: 5' 4" (162.6 cm) ?Weight: 48.2 kg (106 lb 4.2 oz) ?IBW/kg (Calculated) : 59.2 ?Heparin Dosing Weight: TBW ? ?Vital Signs: ?Temp: 98.1 ?F (36.7 ?C) (03/20 0400) ?Temp Source: Oral (03/20 0400) ?BP: 90/55 (03/20 0400) ?Pulse Rate: 70 (03/20 0400) ? ?Labs: ?Recent Labs  ?  07/09/21 ?1011 07/09/21 ?1046 07/09/21 ?1225 07/09/21 ?1305 07/10/21 ?0143 07/10/21 ?0217 07/10/21 ?0710 07/10/21 ?1307 07/10/21 ?2120 07/11/21 ?4834 07/11/21 ?1007  ?HGB 7.4*  --  7.1*  --   --  7.7*  --   --   --  8.1*  --   ?HCT 25.6*  --  21.0*  --   --  25.2*  --   --   --  26.4*  --   ?PLT 187  --   --   --   --  161  --   --   --  163  --   ?HEPARINUNFRC  --   --   --   --  <0.10*  --   --    < > 0.32 0.26* 0.40  ?CREATININE 1.83*  --   --   --  1.87*  --   --   --   --  2.08*  --   ?TROPONINIHS  --  41*  --  60*  --   --  57*  --   --   --   --   ? < > = values in this interval not displayed.  ? ? ? ?Estimated Creatinine Clearance: 25.1 mL/min (A) (by C-G formula based on SCr of 2.08 mg/dL (H)). ? ?Assessment: ?37 yom who presents to the ED with SOB and chest pressure. He has a hx PAH. CTA chest reveals acute PE with possible punctate punctate thrombus within the proximal right apical segmental pulmonary artery. He does not take AC PTA. Pharmacy consulted to begin IV heparin. ? ?Heparin level remains therapeutic (0.4) on gtt at 1050 units/hr. S/p chest tube placed 3/19. Hgb low but stable in the 7s (s/p PRBC 3/18). ? ?Goal of Therapy:  ?Heparin level 0.3-0.7 units/ml ?Monitor platelets by anticoagulation protocol: Yes ?  ?Plan:  ?- Continue heparin infusion at 1050 units/hr  ?- Daily heparin level and CBC ?- Monitor for signs/symptoms of bleeding ? ?Sherlon Handing, PharmD,  BCPS ?Please see amion for complete clinical pharmacist phone list ?07/11/2021 11:23 AM ? ? ? ? ? ?

## 2021-07-12 DIAGNOSIS — N1832 Chronic kidney disease, stage 3b: Secondary | ICD-10-CM | POA: Diagnosis not present

## 2021-07-12 DIAGNOSIS — Z7189 Other specified counseling: Secondary | ICD-10-CM

## 2021-07-12 DIAGNOSIS — J9601 Acute respiratory failure with hypoxia: Secondary | ICD-10-CM | POA: Diagnosis not present

## 2021-07-12 DIAGNOSIS — I96 Gangrene, not elsewhere classified: Secondary | ICD-10-CM | POA: Diagnosis present

## 2021-07-12 DIAGNOSIS — I2699 Other pulmonary embolism without acute cor pulmonale: Secondary | ICD-10-CM | POA: Diagnosis not present

## 2021-07-12 DIAGNOSIS — I5043 Acute on chronic combined systolic (congestive) and diastolic (congestive) heart failure: Secondary | ICD-10-CM | POA: Diagnosis not present

## 2021-07-12 LAB — BASIC METABOLIC PANEL
Anion gap: 10 (ref 5–15)
BUN: 31 mg/dL — ABNORMAL HIGH (ref 8–23)
CO2: 26 mmol/L (ref 22–32)
Calcium: 7.5 mg/dL — ABNORMAL LOW (ref 8.9–10.3)
Chloride: 102 mmol/L (ref 98–111)
Creatinine, Ser: 2.78 mg/dL — ABNORMAL HIGH (ref 0.61–1.24)
GFR, Estimated: 25 mL/min — ABNORMAL LOW (ref >60)
Glucose, Bld: 97 mg/dL (ref 70–99)
Potassium: 3.8 mmol/L (ref 3.5–5.1)
Sodium: 138 mmol/L (ref 135–145)

## 2021-07-12 LAB — CBC
HCT: 25.5 % — ABNORMAL LOW (ref 39.0–52.0)
Hemoglobin: 8.1 g/dL — ABNORMAL LOW (ref 13.0–17.0)
MCH: 30.1 pg (ref 26.0–34.0)
MCHC: 31.8 g/dL (ref 30.0–36.0)
MCV: 94.8 fL (ref 80.0–100.0)
Platelets: 166 10*3/uL (ref 150–400)
RBC: 2.69 MIL/uL — ABNORMAL LOW (ref 4.22–5.81)
RDW: 18.6 % — ABNORMAL HIGH (ref 11.5–15.5)
WBC: 7.5 10*3/uL (ref 4.0–10.5)
nRBC: 0 % (ref 0.0–0.2)

## 2021-07-12 LAB — PROCALCITONIN: Procalcitonin: 4.99 ng/mL

## 2021-07-12 LAB — CYTOLOGY - NON PAP

## 2021-07-12 LAB — HEPARIN LEVEL (UNFRACTIONATED): Heparin Unfractionated: 0.4 IU/mL (ref 0.30–0.70)

## 2021-07-12 LAB — MAGNESIUM: Magnesium: 2.1 mg/dL (ref 1.7–2.4)

## 2021-07-12 NOTE — Progress Notes (Signed)
Patient resting comfortably on room air. No respiratory distress noted. BIPAP not needed at this time. RT will monitor as needed. ?

## 2021-07-12 NOTE — Progress Notes (Signed)
ANTICOAGULATION CONSULT NOTE ? ?Pharmacy Consult for heparin ?Indication: pulmonary embolus ? ?Allergies  ?Allergen Reactions  ? Dilantin [Phenytoin]   ?  Made him confused, manic  ? Oxycodone Other (See Comments)  ?  Hallucinations  ? ? ?Patient Measurements: ?Height: _0  (162.6 cm) ?Weight: 50 kg (110 lb 3.7 oz) ?IBW/kg (Calculated) : 59.2 ?Heparin Dosing Weight: TBW ? ?Vital Signs: ?Temp: 98.9 ?F (37.2 ?C) (03/21 0420) ?Temp Source: Oral (03/21 0420) ?BP: 93/54 (03/21 0420) ?Pulse Rate: 91 (03/21 0420) ? ?Labs: ?Recent Labs  ?  07/09/21 ?1046 07/09/21 ?1225 07/09/21 ?1305 07/10/21 ?0143 07/10/21 ?0217 07/10/21 ?0710 07/10/21 ?1307 07/11/21 ?9166 07/11/21 ?1007 07/12/21 ?0113  ?HGB  --    < >  --   --  7.7*  --   --  8.1*  --  8.1*  ?HCT  --    < >  --   --  25.2*  --   --  26.4*  --  25.5*  ?PLT  --   --   --   --  161  --   --  163  --  166  ?HEPARINUNFRC  --   --   --  <0.10*  --   --    < > 0.26* 0.40 0.40  ?CREATININE  --   --   --  1.87*  --   --   --  2.08*  --  2.78*  ?TROPONINIHS 41*  --  60*  --   --  57*  --   --   --   --   ? < > = values in this interval not displayed.  ? ? ? ?Estimated Creatinine Clearance: 19.5 mL/min (A) (by C-G formula based on SCr of 2.78 mg/dL (H)). ? ?Assessment: ?64 yom who presents to the ED with SOB and chest pressure. He has a hx PAH. CTA chest reveals acute PE with possible punctate punctate thrombus within the proximal right apical segmental pulmonary artery. He does not take AC PTA. Pharmacy consulted to begin IV heparin. ? ?Heparin level remains therapeutic (0.4) on gtt at 1150 units/hr. S/p chest tube placed 3/19. Hgb low but stable in the 8s (s/p PRBC 3/18). Platelet count OK. ? ?Goal of Therapy:  ?Heparin level 0.3-0.7 units/ml ?Monitor platelets by anticoagulation protocol: Yes ?  ?Plan:  ?- Continue heparin infusion at 1150 units/hr  ?- Daily heparin level and CBC ?- Monitor for signs/symptoms of bleeding ? ?[place] Crane, Pharm D, BCPS, BCCP ?Clinical  Pharmacist ? 07/12/2021 8:10 AM  ? ?[facility] pharmacy phone numbers are listed on [email or web address] ? ? ? ? ? ? ?

## 2021-07-12 NOTE — Progress Notes (Addendum)
Mobility Specialist Progress Note ? ? 07/12/21 1623  ?Mobility  ?Activity Ambulated with assistance in hallway  ?Level of Assistance Minimal assist, patient does 75% or more  ?Assistive Device Front wheel walker  ?Distance Ambulated (ft) 340 ft  ?Activity Response Tolerated well  ?$Mobility charge 1 Mobility  ? ?Pt received in bed agitated to be bothered but agreeable to mobility. Pt refusing physical assistance from me and only allowing assistance from wife for bed mobility, needing increase time. Pt requiring mod vocal cues d/t impulsiveness and min A for instability while standing. HR rose to 149bpm while ambulating but pt stating to  feel fine, x1 standing rest break taken by MS to slow pt's HR down. Returned back to EOB still refusing any physical assistance to bed. Made sure pt got back safely andl eft call bell by side. ? ?Pre Mobility: 112 HR 94% SpO2 ?During Mobility: 149 HR, 97% SpO2 ?Post Mobility: 115 HR, 96% SpO2 ? ?Holland Falling ?Mobility Specialist ?Phone Number [phone] ? ?

## 2021-07-12 NOTE — Assessment & Plan Note (Signed)
Pt has multiple severe comorbidities that are impacting both quality and potential quantity of life. He has declined further work up for gangrene of this right great toe, IVC filter, but wishes to continue anticoagulation risking GI bleeding and to continue other medications at chronic medical conditions. He remains full code and desires to continue selecting from full scope of interventions. Does not desire to continue palliative care talks at this time.  ?

## 2021-07-12 NOTE — Progress Notes (Signed)
07/12/2021 ? ? ?I have seen and evaluated the patient for effusion. ? ?S:  ?No events, minimal chest tube output. ? ?O: ?Blood pressure 115/65, pulse 92, temperature 98.7 ?F (37.1 ?C), temperature source Oral, resp. rate (!) 21, height 5' 4" (1.626 m), weight 50 kg, SpO2 98 %.  ?Frail thin man in NAD on RA ?Lungs diminished on L base, clear on R ?Profoundly weak ?Chest tube with serous fluid, no tidaling, no air leak with cough ?CXR R lung clear with pigtail in place, L lung continued effusion ? ?A:  ?Bilateral chronic pleural effusions R>L.  R drained and transudative.  Overall cause is some combination of profound malnutrition (my suspicion), CKD.  Does not appear volume overloaded and renal function worsening with diuretic trial. ? 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 ?Acute PE in setting of bedbound status ? 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 Chase Caller ? ?P:  ?- R lung clear, on waterseal, no air leak, minimal output>> pigtail removed at bedside; this effusion will inevitably recur. ?- Regarding PE, given baseline pulmonary HTN, I think we need to challenge him with [facility]; given size and CKD I think we can do eliquis 2.70m BID, will defer to primary; should he not tolerate this and develop GIB, only option would be IVC filter placement ?- Until his mobility improves, I would [facility] ?- Consider workup for nephrotic syndrome ?- Will be available PRN, please reach out by securechat or page ? ? ?DErskine EmeryMD ?[facility] ?Prefer epic messenger for cross cover needs ?If after hours, please call E-link ? ?

## 2021-07-12 NOTE — Progress Notes (Signed)
?Progress Note ? ?Patient: [name] XJO:[id number] DOB: June 11, 1960  ?DOA: 07/09/2021  DOS: 07/12/2021  ?  ?Brief hospital course: ?SOPHIA CUBERO is a 61 y.o. male with a history of scleroderma, HFrEF, stage IV CKD, recent C. diff infection and seizure who presented to the ED 3/18 with shortness of breath found to have acute PE, also felt to be volume overloaded with enlarging right pleural effusion w/loculations by CT, as well as anemia. 1u PRBCs given and pt admitted, developed respiratory distress for which she was transferred to the ICU under CCM service, pigtail chest tube placed with improvement in dyspnea, diuresis continued. Cardiology following, chest tube to water seal 3/20.  ? ?Assessment and Plan: ?* Acute pulmonary embolism (HCC) ?- IV heparin started, will continue to monitor for bleeding (hx GI AVMs). LE venous U/S negative for DVT. If no bleeding, convert to [facility] 3/22. If he bleeds, would again revisit IVC filter discussions. Despite high risk for anticoagulation, he already has multifactorial pulmonary HTN and should continue AC if able. ? ?Acute on chronic combined systolic and diastolic CHF (congestive heart failure) ([place]) ?New/enlarging pleural effusions, BNP 971.1. Echo w/LVEF 40-45%, global hypokinesis, G2DD in Feb 2023.  ?- Converted lasix 68m IV BID to torsemide. Cr continues to rise and appears roughly euvolemic now. Stop diuretic for today, monitor BMP in AM.  ?- Cardiology consultant signed off.   ?- Weights, I/O, BMP ?- CKD limits ACE/ARB/ARNI/spiro ? ?Goals of care, counseling/discussion ?Pt has multiple severe comorbidities that are impacting both quality and potential quantity of life. He has declined further work up for gangrene of this right great toe, IVC filter, but wishes to continue anticoagulation risking GI bleeding and to continue other medications at chronic medical conditions. He remains full code and desires to continue selecting from full scope of interventions.  Does not desire to continue palliative care talks at this time.  ? ?Dry gangrene ([place] ?Of distal right great toe. Pt clearly declines vascular work up or treatment for this at this time. ? ?Acute respiratory failure with hypoxia [place] ?Multifactorial, primarily related to pleural effusions (enlarged on right with loculations, new on left). Now s/p pigtail 3/19 - 3/21.  ?- Pleural effusion is expected to recur. ?- Hypoxia much improved. Ambulate with pulse oximetry prior to discharge. ? ?Tobacco use ?Wheezing noted on arrival, though no Dx of COPD.  ?- Nicotine patch prn ?- Smoking cessation counseling ?- Consider formal PFTs after DC ?- Continue prn BDs ? ?C. difficile colitis ?Patient had just had stool studies checked by his PCP was noted to be still positive for C. difficile with reported symptoms. Recommended to start Dificid 200 mg twice daily, but was not available at his pharmacy until Tuesday. ?- Started dificid on admission, will continue.  ?- IP recommends enteric isolation. Symptoms resolved. ? ?Elevated troponin ?Acute.  High-sensitivity troponin 41->60.  Patient reports chest discomfort has resolved.  Suspect secondary to demand in setting of pulmonary embolus and CHF exacerbation. ?- No inpatient work up planned per my discussion with cardiology, Dr. NAcie Fredrickson ? ?Pressure ulcer, stage III ([place] ?On physical exam patient noted to have signs of tunneling of the pressure ulcer left glute, but uninfected. ?-Low-air-loss mattress replaced ?-Wound care consult ? ?Hypokalemia ?Resolved with supplementation.  ?- Recheck in AM ? ?Hypocalcemia ?Calcium 7.5 on admission mildly low ionized calcium of 1.1.  ?- Continue Tums ? ?Seizure ([place] ?Patient had just recently been hospitalized for seizures last month.   ?- Continue lacosamide 100 mg  twice daily ? ?Protein-calorie malnutrition, severe ?Dietitian consulted, supplement protein as able. ? ?Anemia in CKD (chronic kidney disease) ?Acute on chronic.  Patient has  required blood transfusions in the past.  No reports of bleeding ?- s/p 1u PRBCs, monitor CBC ?- ESA would be appropriate, defer to nephrology. ?- Had some nosebleed with heparin. Will continue monitoring clinically and CBC in AM before transition to irreversible DOAC. ? ?Pulmonary hypertension ([place]) ?Followed by cardiology ?- Continue macitentan and sildenafil. Continuing anticoagulation as above to minimize CTEPH. ? ?CKD (chronic kidney disease), stage III ([place]) ?Suspect stage IIIb. Cr up again today, deescalated diuresis as above.  ?- Avoid nephrotoxins, monitor BMP in AM ?- Follow up with nephrology, Dr. Joelyn Oms, as outpatient. ? ?Scleroderma ([place]) ?No home medications at this time. ? ? ?Subjective: Breathing much better since chest tube. Pain is moderate, manageable. No diarrhea per pt. No chest pain. Had small nosebleed yesterday, no other bleeding. ? ?Objective: ?Vitals:  ? 07/12/21 0001 07/12/21 0030 07/12/21 0420 07/12/21 0817  ?BP: (!) 97/57 (!) 97/57 (!) 93/54 115/65  ?Pulse: (!) 109 98 91 92  ?Resp: _0 (!) 21  ?Temp: 99.7 ?F (37.6 ?C)  98.9 ?F (37.2 ?C) 98.7 ?F (37.1 ?C)  ?TempSrc: Oral  Oral Oral  ?SpO2: 99% 98% 99% 98%  ?Weight:   50 kg   ?Height:      ?Gen: Frail, chronically ill-appearing male in no distress ?Pulm: Nonlabored tachypnea, diminished bilateral bases, crackles without wheezes. No air leak w/chest tube this AM. ~40cc out per RN ?CV: Regular rate and rhythm. No murmur, rub, or gallop. No JVD, no pitting dependent edema. ?GI: Abdomen soft, non-tender, non-distended, with normoactive bowel sounds.  ?Ext: Warm, no deformities. Diminished pulses. R great toe with distal dry gangrene. ?Skin: No new rashes, lesions or ulcers on visualized skin. ?Neuro: Alert and oriented. No focal neurological deficits. ?Psych: Judgement and insight appear fair. Mood euthymic & affect congruent. Behavior is appropriate.   ? ?Data Personally reviewed: ?CBC: ?Recent Labs  ?Lab 07/09/21 ?1011 07/09/21 ?1225  07/10/21 ?0217 07/11/21 ?2119 07/12/21 ?0113  ?WBC 5.6  --  6.6 5.8 7.5  ?HGB 7.4* 7.1* 7.7* 8.1* 8.1*  ?HCT 25.6* 21.0* 25.2* 26.4* 25.5*  ?MCV 102.8*  --  95.5 95.7 94.8  ?PLT 187  --  161 163 166  ? ?Basic Metabolic Panel: ?Recent Labs  ?Lab 07/09/21 ?1011 07/09/21 ?1225 07/09/21 ?1619 07/10/21 ?0143 07/11/21 ?4174 07/12/21 ?0113  ?NA 143 144  --  141 139 138  ?K 3.2* 3.1*  --  3.7 3.7 3.8  ?CL 108  --   --  107 103 102  ?CO2 27  --   --  _1 ?GLUCOSE 91  --   --  78 99 97  ?BUN 22  --   --  21 27* 31*  ?CREATININE 1.83*  --   --  1.87* 2.08* 2.78*  ?CALCIUM 7.5*  --   --  7.7* 7.6* 7.5*  ?MG  --   --  1.3*  --   --  2.1  ? ?GFR: ?Estimated Creatinine Clearance: 19.5 mL/min (A) (by C-G formula based on SCr of 2.78 mg/dL (H)). ?Liver Function Tests: ?Recent Labs  ?Lab 07/09/21 ?1011 07/10/21 ?1307  ?AST 39 35  ?ALT 30 23  ?ALKPHOS 94 85  ?BILITOT 0.4 0.5  ?PROT 5.7* 5.6*  ?ALBUMIN 2.1* 2.1*  ? ?No results for input(s): LIPASE, AMYLASE in the last 168 hours. ?Recent Labs  ?Lab  07/09/21 ?1046  ?AMMONIA 14  ? ?Coagulation Profile: ?No results for input(s): INR, PROTIME in the last 168 hours. ?Cardiac Enzymes: ?No results for input(s): CKTOTAL, CKMB, CKMBINDEX, TROPONINI in the last 168 hours. ?BNP (last 3 results) ?No results for input(s): PROBNP in the last 8760 hours. ?HbA1C: ?No results for input(s): HGBA1C in the last 72 hours. ?CBG: ?Recent Labs  ?Lab 07/10/21 ?1313 07/10/21 ?1525  ?GLUCAP 129* 109*  ? ?Lipid Profile: ?No results for input(s): CHOL, HDL, LDLCALC, TRIG, CHOLHDL, LDLDIRECT in the last 72 hours. ?Thyroid Function Tests: ?Recent Labs  ?  07/09/21 ?1617  ?TSH 7.285*  ? ?Anemia Panel: ?No results for input(s): VITAMINB12, FOLATE, FERRITIN, TIBC, IRON, RETICCTPCT in the last 72 hours. ?Urine analysis: ?   ?Component Value Date/Time  ? COLORURINE YELLOW 07/10/2021 1249  ? APPEARANCEUR CLEAR 07/10/2021 1249  ? LABSPEC 1.019 07/10/2021 1249  ? PHURINE 8.0 07/10/2021 1249  ? GLUCOSEU 150 (A)  07/10/2021 1249  ? HGBUR NEGATIVE 07/10/2021 1249  ? BILIRUBINUR NEGATIVE 07/10/2021 1249  ? [place] NEGATIVE 07/10/2021 1249  ? PROTEINUR >=300 (A) 07/10/2021 1249  ? NITRITE NEGATIVE 07/10/2021 1249  ? LEUKO

## 2021-07-12 NOTE — Assessment & Plan Note (Signed)
Of distal right great toe. Pt clearly declines vascular work up or treatment for this at this time. ?

## 2021-07-13 ENCOUNTER — Other Ambulatory Visit (HOSPITAL_COMMUNITY): Payer: Self-pay

## 2021-07-13 ENCOUNTER — Encounter (HOSPITAL_COMMUNITY): Payer: Self-pay

## 2021-07-13 ENCOUNTER — Encounter (HOSPITAL_COMMUNITY): Payer: Medicare Other

## 2021-07-13 DIAGNOSIS — I5043 Acute on chronic combined systolic (congestive) and diastolic (congestive) heart failure: Secondary | ICD-10-CM | POA: Diagnosis not present

## 2021-07-13 DIAGNOSIS — J9601 Acute respiratory failure with hypoxia: Secondary | ICD-10-CM | POA: Diagnosis not present

## 2021-07-13 DIAGNOSIS — Z7189 Other specified counseling: Secondary | ICD-10-CM

## 2021-07-13 DIAGNOSIS — N1832 Chronic kidney disease, stage 3b: Secondary | ICD-10-CM | POA: Diagnosis not present

## 2021-07-13 DIAGNOSIS — I2699 Other pulmonary embolism without acute cor pulmonale: Secondary | ICD-10-CM | POA: Diagnosis not present

## 2021-07-13 DIAGNOSIS — I96 Gangrene, not elsewhere classified: Secondary | ICD-10-CM

## 2021-07-13 LAB — CBC
HCT: 22.2 % — ABNORMAL LOW (ref 39.0–52.0)
Hemoglobin: 7.1 g/dL — ABNORMAL LOW (ref 13.0–17.0)
MCH: 30.3 pg (ref 26.0–34.0)
MCHC: 32 g/dL (ref 30.0–36.0)
MCV: 94.9 fL (ref 80.0–100.0)
Platelets: 151 10*3/uL (ref 150–400)
RBC: 2.34 MIL/uL — ABNORMAL LOW (ref 4.22–5.81)
RDW: 17.7 % — ABNORMAL HIGH (ref 11.5–15.5)
WBC: 7.7 10*3/uL (ref 4.0–10.5)
nRBC: 0 % (ref 0.0–0.2)

## 2021-07-13 LAB — BODY FLUID CULTURE W GRAM STAIN: Culture: NO GROWTH

## 2021-07-13 LAB — BASIC METABOLIC PANEL
Anion gap: 8 (ref 5–15)
BUN: 39 mg/dL — ABNORMAL HIGH (ref 8–23)
CO2: 29 mmol/L (ref 22–32)
Calcium: 7.5 mg/dL — ABNORMAL LOW (ref 8.9–10.3)
Chloride: 99 mmol/L (ref 98–111)
Creatinine, Ser: 3.02 mg/dL — ABNORMAL HIGH (ref 0.61–1.24)
GFR, Estimated: 23 mL/min — ABNORMAL LOW (ref >60)
Glucose, Bld: 82 mg/dL (ref 70–99)
Potassium: 3.6 mmol/L (ref 3.5–5.1)
Sodium: 136 mmol/L (ref 135–145)

## 2021-07-13 LAB — PREPARE RBC (CROSSMATCH)

## 2021-07-13 LAB — HEPARIN LEVEL (UNFRACTIONATED): Heparin Unfractionated: 0.29 IU/mL — ABNORMAL LOW (ref 0.30–0.70)

## 2021-07-13 LAB — PROCALCITONIN: Procalcitonin: 3.87 ng/mL

## 2021-07-13 MED ORDER — DARBEPOETIN ALFA 100 MCG/0.5ML IJ SOSY
100.0000 ug | PREFILLED_SYRINGE | Freq: Once | INTRAMUSCULAR | Status: AC
  Administered 2021-07-13: 100 ug via SUBCUTANEOUS
  Filled 2021-07-13: qty 0.5

## 2021-07-13 MED ORDER — FIDAXOMICIN 200 MG PO TABS
200.0000 mg | ORAL_TABLET | Freq: Two times a day (BID) | ORAL | 0 refills | Status: AC
  Filled 2021-07-13: qty 12, 6d supply, fill #0

## 2021-07-13 MED ORDER — SODIUM CHLORIDE 0.9% IV SOLUTION: Freq: Once | INTRAVENOUS | Status: AC

## 2021-07-13 MED ORDER — APIXABAN 2.5 MG PO TABS
2.5000 mg | ORAL_TABLET | Freq: Two times a day (BID) | ORAL | Status: DC
  Administered 2021-07-13: 2.5 mg via ORAL
  Filled 2021-07-13: qty 1

## 2021-07-13 MED ORDER — APIXABAN 2.5 MG PO TABS
2.5000 mg | ORAL_TABLET | Freq: Two times a day (BID) | ORAL | 0 refills | Status: DC
  Filled 2021-07-13: qty 60, 30d supply, fill #0

## 2021-07-13 NOTE — Progress Notes (Signed)
Physical Therapy Treatment ?Patient Details ?Name: [name] ?MRN: [id number] ?DOB: [birth year] ?Today's Date: 07/13/2021 ? ? ?History of Present Illness Patient is a 61 y/o male who presents on 07/09/21 with SOB and chest pain. Admitted with acute segmental PE in RLL and possible punctate thrombus in the RUL in addition to loculated right pleural effusion and moderate left pleural effusions.s/p chest tube placement 07/10/21.  PMH includes CAD, CHF, CKD IV, scleroderma, pulmonary hypertension, loculated right pleural effusion, seizures. ? ?  ?PT Comments  ? ? Pt received in supine, sleeping but able to be awoken and agreeable to therapy session with encouragement. Emphasis on activity pacing, transfer safety, safe use of RW, stair negotiation and gait progression with RW. Pt needing up to minA to perform functional mobility tasks (stairs) but needing mostly Supervision to perform gait trial today with AD. Pt family present and report that he has a gait belt at home, recommend they utilize gait belt for mobility progression, especially when ascending/descending stairs. Pt continues to benefit from PT services to progress toward functional mobility goals.   ?Recommendations for follow up therapy are one component of a multi-disciplinary discharge planning process, led by the attending physician.  Recommendations may be updated based on patient status, additional functional criteria and insurance authorization. ? ?Follow Up Recommendations ? No PT follow up ?  ?  ?Assistance Recommended at Discharge Intermittent Supervision/Assistance  ?Patient can return home with the following A little help with bathing/dressing/bathroom;Assistance with cooking/housework;Assist for transportation;Help with stairs or ramp for entrance;A little help with walking and/or transfers ?  ?Equipment Recommendations ? None recommended by PT  ?  ?Recommendations for Other Services   ? ? ?  ?Precautions / Restrictions Precautions ?Precautions:  Fall;Other (comment) ?Precaution Comments: Enteric; dressing on R back where CT recently removed ?Restrictions ?Weight Bearing Restrictions: No  ?  ? ?Mobility ? Bed Mobility ?Overal bed mobility: Needs Assistance ?Bed Mobility: Supine to Sit ?  ?  ?Supine to sit: Min guard, HOB elevated ?  ?  ?General bed mobility comments: increased time, no assist with trunk to get to EOB. Cues for technique. HOB elevated as pt has hospital bed. Pt took >5 minutes to scoot hips forward prior to standing and c/o "stiffness" ?  ? ?Transfers ?Overall transfer level: Needs assistance ?Equipment used: Rolling walker (2 wheels) ?Transfers: Sit to/from Stand ?Sit to Stand: Min guard ?  ?  ?  ?  ?  ?General transfer comment: Min guard for safety. Stood from EOB. Cues for hand placement, RW stabilized for him to pull up on as per family "this is how we do it at home" ?  ? ?Ambulation/Gait ?Ambulation/Gait assistance: Supervision ?Gait Distance (Feet): 110 Feet ?Assistive device: Rolling walker (2 wheels) ?Gait Pattern/deviations: Step-through pattern, Decreased stride length, Narrow base of support, Trunk flexed, Drifts right/left ?Gait velocity: decreased ?  ?  ?General Gait Details: Slow, guarded gait with narrow boS. Mild lateral LOB with fatigue but corrected with min guard ? ? ?Stairs ?Stairs: Yes ?Stairs assistance: Min assist ?Stair Management: One rail Left, Forwards, Step to pattern ?Number of Stairs: 4 (1 step x4 reps) ?General stair comments: pt maintains B knees flexed ascending/descending but no significant LOB, minA for stability/safety with gait belt ? ? ?Wheelchair Mobility ?  ? ?Modified Rankin (Stroke Patients Only) ?  ? ? ?  ?Balance Overall balance assessment: Needs assistance ?Sitting-balance support: Feet supported, No upper extremity supported ?Sitting balance-Leahy Scale: Good ?  ?  ?Standing balance support: During  functional activity, Reliant on assistive device for balance ?Standing balance-Leahy Scale:  Poor ?Standing balance comment: pt steadier using RW, at times states "I don't need that" but agreeable to use it with encouragement. ?  ?  ?  ?  ?  ?  ?  ?  ?  ?  ?  ?  ? ?  ?Cognition Arousal/Alertness: Awake/alert (awoken and initially drowsy but more alert by end of session) ?Behavior During Therapy: Flat affect ?Overall Cognitive Status: Impaired/Different from baseline ?Area of Impairment: Memory, Awareness, Safety/judgement ?  ?  ?  ?  ?  ?  ?  ?  ?  ?  ?Memory: Decreased short-term memory ?  ?Safety/Judgement: Decreased awareness of safety ?Awareness: Emergent ?  ?General Comments: pt took >5 minutes to transition from supine to EOB due to c/o "stiffness" and notably drowsy but once standing at RW, more alert; pt does well with goal-oriented task, impulsive to walk in hallway although at beginning of session pt was instructed "we can stay in the room", pt can be self-directed. ?  ?  ? ?  ?Exercises Other Exercises ?Other Exercises: seated BLE AROM: ankle pumps, knee flex/ext x10 reps ea ? ?  ?General Comments General comments (skin integrity, edema, etc.): HR 95 bpm seated EOB, no overt s/sx distress with mobility tasks ?  ?  ? ?Pertinent Vitals/Pain Pain Assessment ?Pain Assessment: Faces ?Faces Pain Scale: Hurts little more ?Pain Location: not localized -joint pain and pressure sore on bottom ?Pain Descriptors / Indicators: Other (Comment), Discomfort, Guarding ("stiffness") ?Pain Intervention(s): Limited activity within patient's tolerance, Monitored during session, Repositioned  ? ? ? ?PT Goals (current goals can now be found in the care plan section) Acute Rehab PT Goals ?Patient Stated Goal: not get stiff ?PT Goal Formulation: With patient ?Time For Goal Achievement: 07/25/21 ?Progress towards PT goals: Progressing toward goals ? ?  ?Frequency ? ? ? Min 3X/week ? ? ? ?  ?PT Plan Current plan remains appropriate  ? ? ?   ?AM-PAC PT "6 Clicks" Mobility   ?Outcome Measure ? Help needed turning from your  back to your side while in a flat bed without using bedrails?: A Little ?Help needed moving from lying on your back to sitting on the side of a flat bed without using bedrails?: A Little ?Help needed moving to and from a bed to a chair (including a wheelchair)?: A Little ?Help needed standing up from a chair using your arms (e.g., wheelchair or bedside chair)?: A Little ?Help needed to walk in hospital room?: A Little ?Help needed climbing 3-5 steps with a railing? : A Little ?6 Click Score: 18 ? ?  ?End of Session Equipment Utilized During Treatment: Gait belt ?Activity Tolerance: Patient tolerated treatment well ?Patient left: in bed;with call bell/phone within reach;with family/visitor present (pt seated EOB and family present) ?Nurse Communication: Mobility status;Other (comment) (pt seated EOB eating lunch and spouse present, ready to get blood transfusion (per RN was waiting till after PT session)) ?PT Visit Diagnosis: Muscle weakness (generalized) (M62.81);Difficulty in walking, not elsewhere classified (R26.2) ?  ? ? ?Time: 0932-3557 ?PT Time Calculation (min) (ACUTE ONLY): 18 min ? ?Charges:  $Gait Training: 8-22 mins          ?          ? ?Kendyll Huettner P., PTA ?Acute Rehabilitation Services ?Pager: [phone] ?Office: [phone]  ? ? ?Kara Pacer Saharsh Sterling ?07/13/2021, 12:24 PM ? ?

## 2021-07-13 NOTE — TOC Benefit Eligibility Note (Signed)
Patient Advocate Encounter ? ?Insurance verification completed.   ? ?The patient is currently admitted and upon discharge could be taking Eliquis 5 mg. ? ?The current 30 day co-pay is, $35.00.  ? ?The patient is currently admitted and upon discharge could be taking Xarelto 20 mg. ? ?The current 30 day co-pay is, $35.00.  ? ?The patient is insured through [employer]  ? ? ? ?Lyndel Safe, CPhT ?Pharmacy Patient Advocate Specialist ?[facility] Patient Advocate Team ?Direct Number: [phone]  Fax: [phone] ? ? ? ? ? ?  ?

## 2021-07-13 NOTE — Discharge Instructions (Signed)
Information on my medicine - ELIQUIS? (apixaban) ? ?This medication education was reviewed with me or my healthcare representative as part of my discharge preparation.  The pharmacist that spoke with me during my hospital stay was:  ?Pat Patrick, [facility] ? ?Why was Eliquis? prescribed for you? ?Eliquis? was prescribed to treat blood clots that may have been found in the veins of your legs (deep vein thrombosis) or in your lungs (pulmonary embolism) and to reduce the risk of them occurring again. ? ?What do You need to know about Eliquis? ? ?Your dose of Eliquis is 2.5 mg BID.  Eliquis? may be taken with or without food.  ? ?Try to take the dose about the same time in the morning and in the evening. If you have difficulty swallowing the tablet whole please discuss with your pharmacist how to take the medication safely. ? ?Take Eliquis? exactly as prescribed and DO NOT stop taking Eliquis? without talking to the doctor who prescribed the medication.  Stopping may increase your risk of developing a new blood clot.  Refill your prescription before you run out. ? ?After discharge, you should have regular check-up appointments with your healthcare provider that is prescribing your Eliquis?. ?   ?What do you do if you miss a dose? ?If a dose of ELIQUIS? is not taken at the scheduled time, take it as soon as possible on the same day and twice-daily administration should be resumed. The dose should not be doubled to make up for a missed dose. ? ?Important Safety Information ?A possible side effect of Eliquis? is bleeding. You should call your healthcare provider right away if you experience any of the following: ?Bleeding from an injury or your nose that does not stop. ?Unusual colored urine (red or dark brown) or unusual colored stools (red or black). ?Unusual bruising for unknown reasons. ?A serious fall or if you hit your head (even if there is no bleeding). ? ?Some medicines may interact with Eliquis? and might  increase your risk of bleeding or clotting while on Eliquis?[name] To help avoid this, consult your healthcare provider or pharmacist prior to using any new prescription or non-prescription medications, including herbals, vitamins, non-steroidal anti-inflammatory drugs (NSAIDs) and supplements. ? ?This website has more information on Eliquis? (apixaban): http://www.eliquis.com/eliquis/home  ?

## 2021-07-13 NOTE — Progress Notes (Signed)
ANTICOAGULATION CONSULT NOTE ? ?Pharmacy Consult for heparin > Eliquis ?Indication: pulmonary embolus ? ?Allergies  ?Allergen Reactions  ? Dilantin [Phenytoin]   ?  Made him confused, manic  ? Oxycodone Other (See Comments)  ?  Hallucinations  ? ? ?Patient Measurements: ?Height: 5' 4" (162.6 cm) ?Weight: 50.3 kg (110 lb 14.3 oz) ?IBW/kg (Calculated) : 59.2 ?Heparin Dosing Weight: TBW ? ?Vital Signs: ?Temp: 99.3 ?F (37.4 ?C) (03/22 0319) ?Temp Source: Oral (03/22 0319) ?BP: 118/62 (03/22 0319) ?Pulse Rate: 87 (03/22 0319) ? ?Labs: ?Recent Labs  ?  07/11/21 ?5496 07/11/21 ?1007 07/12/21 ?0113 07/13/21 ?5659  ?HGB 8.1*  --  8.1* 7.1*  ?HCT 26.4*  --  25.5* 22.2*  ?PLT 163  --  166 151  ?HEPARINUNFRC 0.26* 0.40 0.40 0.29*  ?CREATININE 2.08*  --  2.78* 3.02*  ? ? ? ?Estimated Creatinine Clearance: 18 mL/min (A) (by C-G formula based on SCr of 3.02 mg/dL (H)). ? ?Assessment: ?67 yom who presents to the ED with SOB and chest pressure. He has a hx PAH. CTA chest reveals acute PE with possible punctate punctate thrombus within the proximal right apical segmental pulmonary artery. He does not take AC PTA. Pharmacy consulted to begin IV heparin. ? ?Heparin level just slightly below goal this AM.  No overt bleeding noted, although Hgb dropped 8.1 > 7.1.  Platelet count stable. ? ?Pharmacy asked to transition IV heparin to Eliquis today.  Received 3 full days of heparin + a few more hours.   ? ?Goal of Therapy:  ?Heparin level 0.3-0.7 units/ml ?Monitor platelets by anticoagulation protocol: Yes ?  ?Plan:  ?Stop IV heparin. ?Per discussion with MD (who discussed with patient and wife), will initiate Eliquis at low dose of 2.5 mg BID due to high-risk of ongoing bleeding. ?Patient and wife counseled to watch for signs of bleeding. ? ?[place] Crane, Pharm D, BCPS, BCCP ?Clinical Pharmacist ? 07/13/2021 11:22 AM  ? ?[facility] pharmacy phone numbers are listed on [email or web address] ? ? ? ? ? ? ? ?

## 2021-07-13 NOTE — Discharge Summary (Signed)
?Physician Discharge Summary ?  ?Patient: [name] MRN: [id number] DOB: Aug 27, 1960  ?Admit date:     07/09/2021  ?Discharge date: 07/13/21  ?Discharge Physician: Patrecia Pour  ? ?PCP: Cody Solian, MD  ? ?Recommendations at discharge:  ?- Repeat CBC at follow up.  ?- GI follow up after discharge. Hx GI bleeding, though none acutely seen here despite initiating anticoagulation.  ?- Nephrology follow up after discharge. Consider redosing retacrit. ?- Follow up with Dr. Haroldine Laws after discharge. Cardiology followed patient while admitted.  ?- Follow up with vascular surgery if fails anticoagulation and needs IVC filter or if he decides he'd like evaluation for dry gangrene of right great toe. ?- Follow up with neurology for seizure disorder. Driving restrictions reinforced here. Continue AED. No seizure episodes.   ? ?Discharge Diagnoses: ?Principal Problem: ?  Acute pulmonary embolism ([place]) ?Active Problems: ?  Acute on chronic combined systolic and diastolic CHF (congestive heart failure) ([place]) ?  Scleroderma ([place]) ?  CKD (chronic kidney disease), stage III ([place]) ?  Pulmonary hypertension ([place]) ?  Anemia in CKD (chronic kidney disease) ?  Protein-calorie malnutrition, severe ?  Seizure ([place]) ?  Hypocalcemia ?  Hypokalemia ?  Pressure ulcer, stage III ([place]) ?  Elevated troponin ?  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  C. difficile colitis ?  Tobacco use ?  Acute respiratory failure with hypoxia ([place]) ?  Dry gangrene ([place]) ?  Goals of care, counseling/discussion ? ?Hospital Course: ?[name] is a 61 y.o. male with a history of scleroderma, HFrEF, stage IV CKD, recent C. diff infection and seizure who presented to the ED 3/18 with shortness of breath found to have acute PE, also felt to be volume overloaded with enlarging right pleural effusion w/loculations by CT, as well as anemia. 1u PRBCs given and pt admitted, developed respiratory distress for  which she was transferred to the ICU under CCM service, pigtail chest tube placed with improvement in dyspnea, diuresis continued. Cardiology following, chest tube to water seal 3/20.  ? ?Patient is beginning to experience hospital delirium and his spouse is requesting discharge if at all possible/reasonable. His hgb is down to 7.1g/dl though he's having no gross bleeding and is hemodynamically stable. We will give aranesp and additional 1u PRBCs. Discharge on lower dose eliquis with strict return precautions for bleeding, specifically GI bleeding. He no longer requires oxygen, even with ambulation. AKI is beginning to improve and he is making normal urine output. ? ?Assessment and Plan: ?* Acute pulmonary embolism ([place]) ?- Transition to eliquis 2.65m po BID. Extensive discussions undertaken among hospitalist and consultants with family. This dose is not based on guidelines, but tailored to this particular patient. Pt and spouse agree to proceed, understanding rationale of lower-than-typical dosing.  IV heparin started, will treat this as the load of anticoagulation due to the patient's exceptionally high risk of life threatening bleed at home (both due to GI bleed history and frailty/diminished physiologic reserve). LE venous U/S negative for DVT. ?- If he bleeds, would again revisit IVC filter discussions. Despite high risk for anticoagulation, he already has multifactorial pulmonary HTN and should continue AC if able. ? ?Acute on chronic combined systolic and diastolic CHF (congestive heart failure) ([place] ?New/enlarging pleural effusions, BNP 971.1. Echo w/LVEF 40-45%, global hypokinesis, G2DD in Feb 2023.  ?- Converted lasix 450mIV BID back to home torsemide ?- Cardiology consultant signed off.   ?- Weights, I/O, BMP ?- CKD  limits ACE/ARB/ARNI/spiro ? ?Goals of care, counseling/discussion ?Pt has multiple severe comorbidities that are impacting both quality and potential quantity of life. He has declined  further work up for gangrene of this right great toe, IVC filter, but wishes to continue anticoagulation risking GI bleeding and to continue other medications at chronic medical conditions. He remains full code and desires to continue selecting from full scope of interventions. Does not desire to continue palliative care talks at this time.  ? ?Dry gangrene ([place]) ?Of distal right great toe. Pt clearly declines vascular work up or treatment for this at this time. ? ?Acute respiratory failure with hypoxia ([place]) ?Multifactorial, primarily related to pleural effusions (enlarged on right with loculations, new on left). Now s/p pigtail 3/19 - 3/21.  ?- Pleural effusion may recur (?realted to third-spacing from malnutrition) ?- No longer hypoxic. ? ?Tobacco use ?Wheezing noted on arrival, though no Dx of COPD.  ?- Nicotine patch prn ?- Smoking cessation counseling ?- Consider formal PFTs after DC ?- Continue prn BDs ? ?C. difficile colitis ?Patient had just had stool studies checked by his PCP was noted to be still positive for C. difficile with reported symptoms. Recommended to start Dificid 200 mg twice daily, but was not available at his pharmacy until Tuesday. ?- Started dificid on admission, will continue.  ?- IP recommends enteric isolation. Symptoms resolved. ? ?Elevated troponin ?Acute.  High-sensitivity troponin 41->60.  Patient reports chest discomfort has resolved.  Suspect secondary to demand in setting of pulmonary embolus and CHF exacerbation. ?- No inpatient work up planned per my discussion with cardiology, Dr. Acie Fredrickson. ? ?Pressure ulcer, stage III ([place]) ?On physical exam patient noted to have signs of tunneling of the pressure ulcer left glute, but uninfected. ?-Low-air-loss mattress replaced ?-Wound care consult ? ?Hypokalemia ?Resolved with supplementation.  ?- Recheck at follow up ? ?Hypocalcemia ?Calcium 7.5 on admission mildly low ionized calcium of 1.1.  ?- Continue Tums ? ?Seizure ([place]) ?Patient had  just recently been hospitalized for seizures last month.   ?- Continue lacosamide 100 mg twice daily ? ?Protein-calorie malnutrition, severe ?Dietitian consulted, supplement protein as able. ? ?Anemia in CKD (chronic kidney disease) ?Acute on chronic.  Patient has required blood transfusions in the past.  No reports of bleeding ?- s/p 1u PRBCs, will administer another for hgb 7.1g/dl on 3/22. Recheck CBC at follow up ?- ESA dosed on day of discharge after discussion with nephrology, Dr. Candiss Norse. ?- Despite hx AVMs, there has been no GI bleeding or other uncontrolled bleeding noted. ? ?Pulmonary hypertension ([place]) ?Followed by cardiology ?- Continue macitentan and sildenafil. Continuing anticoagulation as above to minimize CTEPH. ? ?CKD (chronic kidney disease), stage III ([place]) ?Suspect stage IIIb.  ?- Avoid nephrotoxins, monitor BMP in AM ?- Follow up with nephrology, Dr. Joelyn Oms, as outpatient. ? ?Scleroderma ([place]) ?No home medications at this time. ? ? ? ? ?  ? ?Consultants: PCCM, cardiology, curbside nephrology ?Procedures performed: Right chest tube   ?Disposition: Home ?Diet recommendation:  ?Discharge Diet Orders (From admission, onward)  ? ?  Start     Ordered  ? 07/13/21 0000  Diet - low sodium heart healthy       ? 07/13/21 1040  ? ?  ?  ? ?  ? ?Cardiac diet ?DISCHARGE MEDICATION: ?Allergies as of 07/13/2021   ? ?   Reactions  ? Dilantin [phenytoin]   ? Made him confused, manic  ? Oxycodone Other (See Comments)  ? Hallucinations  ? ?  ? ?  ?  Medication List  ?  ? ?TAKE these medications   ? ?allopurinol 100 MG tablet ?Commonly known as: ZYLOPRIM ?TAKE 1 TABLET BY MOUTH EVERY DAY ?  ?Calcium Antacid 500 MG chewable tablet ?Generic drug: calcium carbonate ?Chew 1 tablet by mouth 2 (two) times daily with a meal. ?  ?Dificid 200 MG Tabs tablet ?Generic drug: fidaxomicin ?Take 1 tablet (200 mg total) by mouth 2 (two) times daily for 6 days. ?  ?Eliquis 2.5 MG Tabs tablet ?Generic drug: apixaban ?Take 1 tablet (2.5  mg total) by mouth 2 (two) times daily. ?  ?feeding supplement Liqd ?Take 237 mLs by mouth 3 (three) times daily between meals. ?  ?Lacosamide 100 MG Tabs ?Take 1 tablet twice a day ?What changed:  ?how muc

## 2021-07-13 NOTE — Progress Notes (Signed)
Blood transfusion complete at 1458. MD ok with pt not having repeat H&H d/t pt wanting to go home. PT also received Aranesp injection.  ? ?RN removed PIVs and went over d/c summary w/ pt's wife. Pt belongings with his wife, including TOC meds. NT transporting pt to private vehicle where pt's wife will transport him home ?

## 2021-07-13 NOTE — TOC Initial Note (Signed)
Transition of Care (TOC) - Initial/Assessment Note  ? ? ?Patient Details  ?Name: [name] ?MRN: [id number] ?Date of Birth: Dec 17, 1960 ? ?Transition of Care (TOC) CM/SW Contact:    ?Angelita Ingles, RN ?Phone [phone] ? ?07/13/2021, 2:12 PM ? ?Clinical Narrative:                 ?TOC consulted for patient with high risk for readmission and HH needs. Patient states that he is from home with wife. Patient reports that he has a PCP and follows up with the heart failure clinic. Wife at bedside and states that she provides all care for patient and would like any HH assistance that she can get. HH RN and [place] aide orders have been entered. Wife has no preference for [facility] agency but states that she does not want [facility]. [place] referral has been accepted by  Gevena Barre with [facility]. Per [facility] aide is not available but OT would be beneficial for ADL's and bath assistance. CM will make MD aware and have order modified.   ? ?Expected Discharge Plan: [place] ?Barriers to Discharge: No Barriers Identified ? ? ?Patient Goals and CMS Choice ?Patient states their goals for this hospitalization and ongoing recovery are:: Ready to go home today ?CMS Medicare.gov Compare Post Acute Care list provided to:: Patient Represenative (must comment) (spouse at bedside) ?Choice offered to / list presented to : Patient, Spouse ? ?Expected Discharge Plan and Services ?Expected Discharge Plan: [place] ?In-house Referral: NA ?Discharge Planning Services: CM Consult ?Post Acute Care Choice: Home Health ?Living arrangements for the past 2 months: [place] ?Expected Discharge Date: 07/13/21               ?DME Arranged: N/A ?  ?  ?  ?  ?HH Arranged: Therapist, sports, OT ?[place] Agency: [facility] ?Date HH Agency Contacted: 07/13/21 ?Time [place]: 0867 ?Representative spoke with at [place]: [place] ? ?Prior Living Arrangements/Services ?Living arrangements for the past 2 months:  [place] ?Lives with:: Spouse ?  ?Do you feel safe going back to the place where you live?: Yes      ?Need for Family Participation in Patient Care: Yes (Comment) ?Care giver support system in place?: Yes (comment) ?Current home services:  (n/a) ?Criminal Activity/Legal Involvement Pertinent to Current Situation/Hospitalization: No - Comment as needed ? ?Activities of Daily Living ?  ?ADL Screening (condition at time of admission) ?Is the patient deaf or have difficulty hearing?: No ?Does the patient have difficulty seeing, even when wearing glasses/contacts?: No ?Does the patient have difficulty concentrating, remembering, or making decisions?: No ?Does the patient have difficulty dressing or bathing?: No ?Does the patient have difficulty walking or climbing stairs?: No ? ?Permission Sought/Granted ?  ?Permission granted to share information with : No ?   ?   ?   ?   ? ?Emotional Assessment ?Appearance:: Appears older than stated age ?Attitude/Demeanor/Rapport: Gracious ?Affect (typically observed): Pleasant, Quiet ?Orientation: : Oriented to Self, Oriented to Place, Oriented to  Time, Oriented to Situation ?Alcohol / Substance Use: Not Applicable ?Psych Involvement: No (comment) ? ?Admission diagnosis:  Shortness of breath [R06.02] ?Pleural effusion [J90] ?Hypoxia [R09.02] ?Acute pulmonary embolism ([place]) [I26.99] ?Acute pulmonary embolism without acute cor pulmonale, unspecified pulmonary embolism type ([place]) [I26.99] ?Patient Active Problem List  ? Diagnosis Date Noted  ? Dry gangrene ([place]) 07/12/2021  ? Goals of care, counseling/discussion 07/12/2021  ?  Tobacco use 07/11/2021  ? Acute respiratory failure with hypoxia ([place]) 07/11/2021  ? Acute pulmonary embolism ([place]) 07/09/2021  ? Elevated troponin 07/09/2021  ? 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 C. difficile colitis 07/09/2021  ? Acute on chronic combined systolic and diastolic CHF  (congestive heart failure) ([place]) 06/04/2021  ? Pressure ulcer, stage III ([place]) 06/04/2021  ? Seizure ([place]) 06/03/2021  ? Hypocalcemia 06/03/2021  ? Hypomagnesemia 06/03/2021  ? Hypokalemia 06/03/2021  ? Metabolic acidosis with normal anion gap and bicarbonate losses 06/03/2021  ? Metabolic acidosis with increased anion gap and accumulation of organic acids 05/17/2021  ? Diarrhea 05/17/2021  ? HCAP (healthcare-associated pneumonia) 02/20/2021  ? Acute blood loss anemia   ? Benign neoplasm of sigmoid colon   ? Acute lower GI bleeding 02/17/2021  ? CAD (coronary artery disease)   ? (HFpEF) heart failure with preserved ejection fraction ([place])   ? CKD (chronic kidney disease), stage IV ([place])   ? Sepsis ([place]) 11/07/2019  ? Pleural effusion 11/07/2019  ? Sepsis associated hypotension ([place]) 11/07/2019  ? Intractable vomiting   ? Abnormal CT scan, small bowel   ? SBO (small bowel obstruction) ([place]) 09/28/2019  ? History of colonic polyps 09/25/2019  ? 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  ? Spitting blood 09/25/2019  ? Protein-calorie malnutrition, severe 02/11/2019  ? S/P pericardial surgery 02/10/2019  ? GIB (gastrointestinal bleeding) 09/14/2018  ? Arteriovenous malformation of gastrointestinal tract   ? Gastritis and gastroduodenitis   ? Adenomatous polyp of transverse colon   ? Anemia in CKD (chronic kidney disease) 08/29/2018  ? Symptomatic anemia 08/28/2018  ? Acute renal failure superimposed on stage 4 chronic kidney disease ([place]) 08/28/2018  ? GI bleed 08/28/2018  ? Chronic systolic (congestive) heart failure (HCC)   ? Melena   ? Pulmonary hypertension ([place]) 06/18/2018  ? Left ventricular dysfunction 06/18/2018  ? Raynaud's syndrome without gangrene 05/24/2016  ? Acute CHF (congestive heart failure) ([place]) 02/24/2016  ? Essential hypertension 02/24/2016  ? Pericardial effusion 05/01/2014  ? Hyperlipidemia 10/18/2012  ? Hypertension 05/14/2012  ? CAD S/P percutaneous coronary angioplasty 04/26/2012   ? Gout 04/26/2012  ? Vitiligo   ? Smoker 06/16/2011  ? CKD (chronic kidney disease), stage III ([place]) 06/16/2011  ? 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 Scleroderma ([place]) 06/15/2011  ? ?PCP:  [name], MD ?Pharmacy:   ?[id number] [address] [place] [facility] [place]?Phone: [phone]: [phone]? ?[facility]?Phone: [phone]: [phone] ? ? ? ? ?Social Determinants of Health (SDOH) Interventions ?  ? ?Readmission Risk Interventions ? ?  07/13/2021  ?  2:07 PM 05/23/2021  ?  3:10 PM 11/10/2019  ? 12:26 PM  ?Readmission Risk Prevention Plan  ?Transportation Screening Complete Complete Complete  ?PCP or Specialist Appt within 3-5 Days  Complete Complete  ?HSan [facility] Home Care Consult  Complete Complete  ?Social Work Consult for RAllenPlanning/Counseling  Complete Complete  ?Palliative Care Screening  Not Applicable Not Applicable  ?Medication Review (Press photographer Complete Complete Complete  ?PCP or Specialist appointment within 3-5 days of discharge Complete    ?H[facility]ondra Parkor Home Care Consult Complete    ?SW Recovery Care/Counseling Consult Complete    ?Palliative Care Screening Not Applicable    ?SClaflinNot Applicable    ? ? ? ?

## 2021-07-13 NOTE — Progress Notes (Signed)
ANTICOAGULATION CONSULT NOTE ? ?Pharmacy Consult for heparin ?Indication: pulmonary embolus ? ?Allergies  ?Allergen Reactions  ? Dilantin [Phenytoin]   ?  Made him confused, manic  ? Oxycodone Other (See Comments)  ?  Hallucinations  ? ? ?Patient Measurements: ?Height: _0  (162.6 cm) ?Weight: 50.3 kg (110 lb 14.3 oz) ?IBW/kg (Calculated) : 59.2 ?Heparin Dosing Weight: TBW ? ?Vital Signs: ?Temp: 99.3 ?F (37.4 ?C) (03/22 0319) ?Temp Source: Oral (03/22 0319) ?BP: 118/62 (03/22 0319) ?Pulse Rate: 87 (03/22 0319) ? ?Labs: ?Recent Labs  ?  07/11/21 ?9449 07/11/21 ?1007 07/12/21 ?0113 07/13/21 ?6759  ?HGB 8.1*  --  8.1* 7.1*  ?HCT 26.4*  --  25.5* 22.2*  ?PLT 163  --  166 151  ?HEPARINUNFRC 0.26* 0.40 0.40 0.29*  ?CREATININE 2.08*  --  2.78* 3.02*  ? ? ? ?Estimated Creatinine Clearance: 18 mL/min (A) (by C-G formula based on SCr of 3.02 mg/dL (H)). ? ?Assessment: ?67 yom who presents to the ED with SOB and chest pressure. He has a hx PAH. CTA chest reveals acute PE with possible punctate punctate thrombus within the proximal right apical segmental pulmonary artery. He does not take AC PTA. Pharmacy consulted to begin IV heparin. ? ?Heparin level just slightly below goal this AM.  No overt bleeding noted, although Hgb dropped 8.1 > 7.1.  Platelet count stable. ? ?Goal of Therapy:  ?Heparin level 0.3-0.7 units/ml ?Monitor platelets by anticoagulation protocol: Yes ?  ?Plan:  ?- Continue heparin infusion at 1150 units/hr (won't increase since will likely switch to DOAC soon?) ?- Xarelto/Eliquis each have $35 copay for him.  Eliquis 2.5 mg BID would be preferred given renal dysfunction. ?- Daily heparin level and CBC ?- Monitor for signs/symptoms of bleeding ? ?[place] Crane, Pharm D, BCPS, BCCP ?Clinical Pharmacist ? 07/13/2021 8:18 AM  ? ?[facility] pharmacy phone numbers are listed on [email or web address] ? ? ? ? ? ? ?

## 2021-07-14 LAB — TYPE AND SCREEN
ABO/RH(D): A POS
Antibody Screen: NEGATIVE
Unit division: 0

## 2021-07-14 LAB — CULTURE, BLOOD (ROUTINE X 2)
Culture: NO GROWTH
Culture: NO GROWTH
Special Requests: ADEQUATE

## 2021-07-14 LAB — BPAM RBC
Blood Product Expiration Date: 202303292359
ISSUE DATE / TIME: 202303221207
Unit Type and Rh: 6200

## 2021-07-20 ENCOUNTER — Telehealth (HOSPITAL_COMMUNITY): Payer: Self-pay
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
## 2021-07-20 NOTE — Telephone Encounter (Signed)
Transitions of Care Pharmacy  ° °Call attempted for a pharmacy transitions of care follow-up. HIPAA appropriate voicemail was left with call back information provided.  ° °Call attempt #1. Will follow-up in 2-3 days.  °  °

## 2021-07-22 ENCOUNTER — Ambulatory Visit: Payer: Medicare Other | Admitting: Neurology

## 2021-07-22 ENCOUNTER — Encounter (HOSPITAL_COMMUNITY): Payer: Medicare Other

## 2021-07-22 NOTE — Progress Notes (Deleted)
? ?NEUROLOGY FOLLOW UP OFFICE NOTE ? ?Betsy Pries Knoth ?[id number] ?18-Jan-1961 ? ?HISTORY OF PRESENT ILLNESS: ?I had the pleasure of seeing [name] in follow-up in the neurology clinic on 07/22/2021.  The patient was last seen a month ago for new onset seizure that occurred on 05/28/2021. He is again accompanied by his wife who helps supplement the history today.  Records and images were personally reviewed where available.  On his last visit, his wife denied any convulsions since 06/03/2021 but ongoing confusion and behavioral changes. EEG in 05/2021 was normal. She called our office on 07/05/21 that his medication was making him feel weird, she was instructed to start weaning off the Dilantin and increase Lacosamide to 172m BID. He was then admitted on 07/09/2021 for shortness of breath and found to have a PE and acute on chronic CHF, discharged home a week ago on Eliquis 2.518mBID. ? ? ? ?I had the pleasure of seeing [name] follow-up in the neurology clinic on 06/21/2021.  The patient was last seen 3 weeks ago for new onset seizure that occurred on 05/28/2021. He is again accompanied by his wife who helps supplement the history today. Records and images were personally reviewed where available.  After his initial visit, he was prescribed Briviact but was unable to obtain from pharmacy due to not having it in stock. He had his routine EEG on 06/03/21, then later that day his wife called to report that his hand was numb, he had a seizure with urinary incontinence and was not himself. His routine EEG showed right temporal slowing and frequent epileptiform discharges, at times in a periodic pattern with no evolution. They were instructed to call EMS, and when EMS arrived, he had 2 witnessed GTCs. He underwent LTM which initially showed right frontal LPDs and some electrographic seizures arising from the right hemisphere. MRI brain without contrast was limited with only DWI imaging performed, showing  increased diffusion signal in the mesial right temporal lobe/right hippocampal formation, possible patchy involvement of the right thalamus. He had low magnesium and calcium on admission. He was treated with Briviact and Dilantin, Vimpat was added due to continued LPDs with note of improvement in morphology with increased medication. During his stay, he remained confused and had a lumbar puncture which was normal. He was discharged home on 06/15/2021 on Briviact 10048mID, Phenytoin 31m46mD, and Vimpat 50mg10m, however his wife brings his medications and he has not been taking Briviact since home for 5 days.  ? ?His wife denies any episodes of staring/unresponsiveness, no convulsions since 06/03/21. The night before yesterday, he broke out in a sweat in his sleep, she woke him up and he said he was fine. He slept all day yesterday. He is still confused, he reports "I'm confused, too much going on, I don't understand what is going on." His wife reports he is able to recognize family members. She manages his medications, he denies any side effects. He denies any olfactory/gustatory hallucinations, focal numbness/tingling/weakness, myoclonic jerks. He has chronic numbness in both hands. He denies any headaches, dizziness, no falls. He is ambulating with a walker and will be doing physical therapy. Sleep and appetite are okay.  ? ?Laboratory Data: ?Lab Results  ?Component Value Date  ? WBC 7.7 07/13/2021  ? HGB 7.1 (L) 07/13/2021  ? HCT 22.2 (L) 07/13/2021  ? MCV 94.9 07/13/2021  ? PLT 151 07/13/2021  ? ?  Chemistry   ?   ?Component Value Date/Time  ?  NA 136 07/13/2021 0649  ? NA 145 (H) 06/11/2018 1216  ? K 3.6 07/13/2021 0649  ? CL 99 07/13/2021 0649  ? CO2 29 07/13/2021 0649  ? BUN 39 (H) 07/13/2021 0649  ? BUN 34 (H) 06/11/2018 2446  ? CREATININE 3.02 (H) 07/13/2021 9507  ? CREATININE 3.21 (H) 02/02/2021 1109  ?    ?Component Value Date/Time  ? CALCIUM 7.5 (L) 07/13/2021 2257  ? CALCIUM 7.7 (L) 05/10/2021 1022  ?  ALKPHOS 85 07/10/2021 1307  ? AST 35 07/10/2021 1307  ? ALT 23 07/10/2021 1307  ? BILITOT 0.5 07/10/2021 1307  ?  ? ? ? ?History on Initial Assessment 05/31/2021: This is a 61 year old right-handed man with a history of hypertension, hypothyroidism, CKD, scleroderma, gout, presenting for evaluation of new onset seizure. He had diarrhea 2 weeks prior and was treated for C. diff, bowel incontinence had resolved when he got home a week prior to the seizure on 05/28/2021. He has no recollection of events, no prior warning, watching TV then waking up to EMS around him with bowel incontinence. No focal weakness. His wife reports he was watching TV then said something weird about a coat, she came to the room to find him with clonic activity of the right arm as it hitting, followed by generalized convulsion with head turn to the left with left arm flexed, he vocalized and was foaming at the mouth. After the seizure, he was staring and did not know what was going on when EMS arrived. He was brought to the ER where bloodwork showed a creatnine of 3.08, calcium 5.9, UDS negative, EtOH negative. I personally reviewed head CT without contrast, no acute changes seen. The next night, he had an episode in his sleep with right arm twitching, moaning, mouth automatisms that lasted a few minutes. Since then, he has had daily episodes where he gets flustered and his mouth gets watery, he would be making chewing movements and swallowing, unable to get his words out. The other day he called to her about something black on the floor. He had a small episode coming up the elevator today where he had to sit and catch his breath, then asked her where they were going. She recalls a confusional episode in June/July 2022 as they were coming home, he asked who was at their house, not recognizing their own cars. She has noticed short-term memory loss since the seizure, not recognizing where his doctor's office is, repeating himself a lot. He gets  upset as his wife reports these symptoms. He denies any olfactory/gustatory hallucinations, deja vu, rising epigastric sensation, focal numbness/tingling/weakness, myoclonic jerks. He has occasional numbness and tingling in both hands and feet from the scleroderma, getting cold easily. He denies any headaches, dizziness, diplopia, dysarthria/dysphagia, neck/back pain. Sleep is okay until he took the antibiotics for C. diff, causing nightmares. He drinks socially, no alcohol prior the seizure. He has been taking calcium twice a day since the seizure, he saw his nephrologist this morning and had repeat bloodwork done. He had a normal birth and early development.  There is no history of febrile convulsions, CNS infections such as meningitis/encephalitis, significant traumatic brain injury, neurosurgical procedures, or family history of seizures. ? ?Prior ASMs: Phenytoin ? ?Diagnostic Data: ?Routine EEG in 05/2021 showed right temporal slowing and frequent epileptiform discharges, at times in a periodic pattern with no evolution.  ?LTM  at [facility] in 05/2021 initially showed right frontal LPDs and some electrographic seizures arising from the  right hemisphere.  ?MRI brain without contrast in 05/2021 was limited with only DWI imaging performed, showing increased diffusion signal in the mesial right temporal lobe/right hippocampal formation, possible patchy involvement of the right thalamus. ? ? ?PAST MEDICAL HISTORY: ?Past Medical History:  ?Diagnosis Date  ? (HFpEF) heart failure with preserved ejection fraction ([place])   ? Anxiety   ? CAD (coronary artery disease)   ? CKD (chronic kidney disease), stage IV ([place])   ? Gout   ? Hyperlipidemia   ? Hypertension 05/14/2012  ?  Lexiscan-- EF 51% ,LV normal  ? Hypothyroid   ? MI (myocardial infarction) ([place]) 2010  ? Pericardial effusion 12/2008  ? Dr [name] performed a subxiphoid window removing 267m of fluid  ? Pericardial effusion 03/09/2010  ? Echo-LVEF >55%, very small  pericardial effusion ,,Stage 1 (impaired ) diastolic fxn, elevated LV filling  ? Pericarditis   ? Raynaud's phenomenon   ? Scleroderma ([place]   ? Seizures ([place]   ? Smoker 09/16/2018  ? 1 ppd  ? Vitiligo   ? ? ?MEDICATIO

## 2021-08-03 ENCOUNTER — Ambulatory Visit: Payer: 59 | Admitting: Physician Assistant

## 2021-08-03 DIAGNOSIS — I3139 Other pericardial effusion (noninflammatory): Secondary | ICD-10-CM

## 2021-08-03 DIAGNOSIS — I2510 Atherosclerotic heart disease of native coronary artery without angina pectoris: Secondary | ICD-10-CM

## 2021-08-03 DIAGNOSIS — Z8774 Personal history of (corrected) congenital malformations of heart and circulatory system: Secondary | ICD-10-CM

## 2021-08-03 DIAGNOSIS — K56609 Unspecified intestinal obstruction, unspecified as to partial versus complete obstruction: Secondary | ICD-10-CM

## 2021-08-03 DIAGNOSIS — A419 Sepsis, unspecified organism: Secondary | ICD-10-CM

## 2021-08-03 DIAGNOSIS — E43 Unspecified severe protein-calorie malnutrition: Secondary | ICD-10-CM

## 2021-08-03 DIAGNOSIS — N183 Chronic kidney disease, stage 3 unspecified: Secondary | ICD-10-CM

## 2021-08-03 DIAGNOSIS — Z8679 Personal history of other diseases of the circulatory system: Secondary | ICD-10-CM

## 2021-08-03 DIAGNOSIS — D123 Benign neoplasm of transverse colon: Secondary | ICD-10-CM

## 2021-08-03 DIAGNOSIS — J90 Pleural effusion, not elsewhere classified: Secondary | ICD-10-CM

## 2021-08-03 DIAGNOSIS — M349 Systemic sclerosis, unspecified: Secondary | ICD-10-CM

## 2021-08-03 DIAGNOSIS — I272 Pulmonary hypertension, unspecified: Secondary | ICD-10-CM

## 2021-08-03 DIAGNOSIS — I7300 Raynaud's syndrome without gangrene: Secondary | ICD-10-CM

## 2021-08-03 DIAGNOSIS — D649 Anemia, unspecified: Secondary | ICD-10-CM

## 2021-08-03 DIAGNOSIS — R7989 Other specified abnormal findings of blood chemistry: Secondary | ICD-10-CM

## 2021-08-03 DIAGNOSIS — M1A09X0 Idiopathic chronic gout, multiple sites, without tophus (tophi): Secondary | ICD-10-CM

## 2021-08-03 DIAGNOSIS — F17200 Nicotine dependence, unspecified, uncomplicated: Secondary | ICD-10-CM

## 2021-08-03 DIAGNOSIS — I10 Essential (primary) hypertension: Secondary | ICD-10-CM

## 2021-08-03 DIAGNOSIS — I519 Heart disease, unspecified: Secondary | ICD-10-CM

## 2021-08-03 DIAGNOSIS — E559 Vitamin D deficiency, unspecified: Secondary | ICD-10-CM

## 2021-08-03 DIAGNOSIS — K2990 Gastroduodenitis, unspecified, without bleeding: Secondary | ICD-10-CM

## 2021-08-03 DIAGNOSIS — Z8709 Personal history of other diseases of the respiratory system: Secondary | ICD-10-CM

## 2021-08-04 ENCOUNTER — Other Ambulatory Visit (HOSPITAL_COMMUNITY): Payer: Self-pay

## 2021-08-04 MED ORDER — OPSUMIT 10 MG PO TABS
10.0000 mg | ORAL_TABLET | Freq: Every day | ORAL | 0 refills | Status: DC
Start: 2021-08-04 — End: 2022-08-24

## 2021-08-10 ENCOUNTER — Encounter (HOSPITAL_COMMUNITY): Payer: Medicare Other

## 2021-08-17 ENCOUNTER — Other Ambulatory Visit: Payer: Self-pay | Admitting: Thoracic Surgery (Cardiothoracic Vascular Surgery)

## 2021-08-17 ENCOUNTER — Other Ambulatory Visit: Payer: Self-pay | Admitting: Rheumatology

## 2021-08-17 DIAGNOSIS — J9859 Other diseases of mediastinum, not elsewhere classified: Secondary | ICD-10-CM

## 2021-08-17 NOTE — Telephone Encounter (Signed)
Please call patient to schedule appt, Return in about 5 months (around 07/31/2021) for Scleroderma ?

## 2021-08-17 NOTE — Telephone Encounter (Signed)
Next Visit: Message sent to front desk to schedule appt, Return in about 5 months (around 07/31/2021) for Scleroderma ? ?Last Visit: 03/02/2021 ? ?Last Fill: 05/09/2021 ? ?DX: Idiopathic chronic gout of multiple sites without tophus ? ?Current Dose per office note 03/02/2021: allopurinol 100 mg daily for management of gout. ? ?Labs: 07/13/2021, RBC 2.34, Hemoglobin 7.1, HCT 22.2, RDW 17.7, BMP, BUN 39, Creatinine 3.02, Calcium 7.5, GFR 23 ? ?Okay to refill allopurinol? ? ?

## 2021-08-18 NOTE — Telephone Encounter (Signed)
Reached out to patient to schedule follow up appointment. Patients wife answered the phone. Informed her that [name] was due for a follow up and she declined to schedule.  ?

## 2021-08-26 ENCOUNTER — Encounter (HOSPITAL_COMMUNITY): Payer: Self-pay

## 2021-08-30 ENCOUNTER — Ambulatory Visit: Payer: 59 | Admitting: Podiatry

## 2021-09-06 ENCOUNTER — Ambulatory Visit: Payer: Medicare Other | Admitting: Neurology

## 2021-09-06 DIAGNOSIS — Z029 Encounter for administrative examinations, unspecified: Secondary | ICD-10-CM

## 2021-09-07 ENCOUNTER — Encounter: Payer: Self-pay | Admitting: Neurology

## 2021-09-09 ENCOUNTER — Other Ambulatory Visit: Payer: Medicare Other

## 2021-09-13 ENCOUNTER — Ambulatory Visit: Payer: Medicare Other | Admitting: Thoracic Surgery (Cardiothoracic Vascular Surgery)

## 2021-09-19 ENCOUNTER — Other Ambulatory Visit: Payer: Self-pay | Admitting: Physician Assistant

## 2021-09-19 DIAGNOSIS — I272 Pulmonary hypertension, unspecified: Secondary | ICD-10-CM

## 2022-05-01 ENCOUNTER — Encounter (HOSPITAL_COMMUNITY): Payer: Self-pay

## 2022-05-01 ENCOUNTER — Telehealth (HOSPITAL_COMMUNITY): Payer: Self-pay | Admitting: Pharmacy Technician

## 2022-05-01 ENCOUNTER — Other Ambulatory Visit (HOSPITAL_COMMUNITY): Payer: Self-pay

## 2022-05-01 NOTE — Telephone Encounter (Signed)
Patient Advocate Encounter   Received notification from [employer] that prior authorization for Sildenafil (6 tabs daily) is required.   PA submitted on CoverMyMeds Key [id number] Status is pending   Will continue to follow.

## 2022-05-01 NOTE — Telephone Encounter (Signed)
Patient Advocate Encounter   Received notification from [place] that prior authorization for Opsumit is required.   PA submitted on CoverMyMeds Key  [id number] Status is pending   Will continue to follow.

## 2022-05-02 ENCOUNTER — Other Ambulatory Visit: Payer: Self-pay | Admitting: Gastroenterology

## 2022-05-10 NOTE — Telephone Encounter (Signed)
Advanced Heart Failure Patient Advocate Encounter  Prior Authorization for Sildenafil (6 tabs daily) has been approved.    PA# [id number] Effective dates: 05/02/22 through 05/02/23  Charlann Boxer, CPhT

## 2022-05-10 NOTE — Telephone Encounter (Signed)
Advanced Heart Failure Patient Advocate Encounter  Prior Authorization for Opsumit has been approved.    PA# [id number] Effective dates: 05/01/22 through 05/02/23  Charlann Boxer, CPhT

## 2022-05-25 DEATH — deceased
